# Patient Record
Sex: Female | Born: 1990 | Race: Black or African American | Hispanic: No | Marital: Single | Smoking: Never smoker
Health system: Southern US, Community
[De-identification: ages and names within clinical notes are randomized; demographics above are authoritative.]

## PROBLEM LIST (undated history)

## (undated) DIAGNOSIS — G35D Multiple sclerosis, unspecified: Secondary | ICD-10-CM

## (undated) DIAGNOSIS — G35 Multiple sclerosis: Secondary | ICD-10-CM

## (undated) DIAGNOSIS — Z789 Other specified health status: Secondary | ICD-10-CM

---
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
## 2008-01-14 ENCOUNTER — Emergency Department (HOSPITAL_COMMUNITY): Admission: EM | Admit: 2008-01-14 | Discharge: 2008-01-14 | Payer: Self-pay | Admitting: Emergency Medicine

## 2008-06-07 ENCOUNTER — Emergency Department (HOSPITAL_COMMUNITY): Admission: EM | Admit: 2008-06-07 | Discharge: 2008-06-07 | Payer: Self-pay | Admitting: Emergency Medicine

## 2008-08-22 ENCOUNTER — Emergency Department (HOSPITAL_COMMUNITY): Admission: EM | Admit: 2008-08-22 | Discharge: 2008-08-22 | Payer: Self-pay | Admitting: Emergency Medicine

## 2008-09-25 ENCOUNTER — Emergency Department (HOSPITAL_COMMUNITY): Admission: EM | Admit: 2008-09-25 | Discharge: 2008-09-25 | Payer: Self-pay | Admitting: Emergency Medicine

## 2009-02-18 ENCOUNTER — Emergency Department (HOSPITAL_COMMUNITY): Admission: EM | Admit: 2009-02-18 | Discharge: 2009-02-18 | Payer: Self-pay | Admitting: Emergency Medicine

## 2009-05-01 ENCOUNTER — Emergency Department (HOSPITAL_COMMUNITY)
Admission: EM | Admit: 2009-05-01 | Discharge: 2009-05-01 | Payer: Self-pay | Source: Home / Self Care | Admitting: Emergency Medicine

## 2009-06-13 ENCOUNTER — Emergency Department (HOSPITAL_COMMUNITY): Admission: EM | Admit: 2009-06-13 | Discharge: 2009-06-13 | Payer: Self-pay | Admitting: Emergency Medicine

## 2010-01-06 ENCOUNTER — Encounter (HOSPITAL_COMMUNITY): Admission: RE | Admit: 2010-01-06 | Discharge: 2010-01-09 | Payer: Self-pay | Admitting: Neurology

## 2010-01-12 ENCOUNTER — Encounter (HOSPITAL_COMMUNITY)
Admission: RE | Admit: 2010-01-12 | Discharge: 2010-02-11 | Payer: Self-pay | Source: Home / Self Care | Admitting: Neurology

## 2010-02-12 ENCOUNTER — Encounter (HOSPITAL_COMMUNITY)
Admission: RE | Admit: 2010-02-12 | Discharge: 2010-03-14 | Payer: Self-pay | Source: Home / Self Care | Admitting: Neurology

## 2010-06-28 LAB — URINALYSIS, ROUTINE W REFLEX MICROSCOPIC
Bilirubin Urine: NEGATIVE
Nitrite: NEGATIVE
Specific Gravity, Urine: <1.005 — ABNORMAL LOW (ref 1.005–1.030)
Urobilinogen, UA: 0.2 mg/dL (ref 0.0–1.0)
pH: 6.5 (ref 5.0–8.0)

## 2010-06-28 LAB — PREGNANCY, URINE: Preg Test, Ur: NEGATIVE

## 2010-07-05 LAB — URINALYSIS, ROUTINE W REFLEX MICROSCOPIC
Ketones, ur: NEGATIVE mg/dL
Nitrite: NEGATIVE
Protein, ur: NEGATIVE mg/dL
Urobilinogen, UA: 0.2 mg/dL (ref 0.0–1.0)

## 2010-07-05 LAB — BASIC METABOLIC PANEL
BUN: 8 mg/dL (ref 6–23)
Calcium: 8.9 mg/dL (ref 8.4–10.5)
Creatinine, Ser: 0.78 mg/dL (ref 0.4–1.2)
GFR calc Af Amer: >60 mL/min (ref >60)

## 2010-07-05 LAB — RAPID URINE DRUG SCREEN, HOSP PERFORMED
Amphetamines: NOT DETECTED
Barbiturates: NOT DETECTED
Cocaine: NOT DETECTED
Opiates: NOT DETECTED
Tetrahydrocannabinol: NOT DETECTED

## 2010-07-05 LAB — CBC
MCV: 85.5 fL (ref 78.0–100.0)
Platelets: 180 10*3/uL (ref 150–400)
RBC: 4.18 MIL/uL (ref 3.87–5.11)
WBC: 8.1 10*3/uL (ref 4.0–10.5)

## 2010-07-05 LAB — DIFFERENTIAL
Basophils Relative: 0 % (ref 0–1)
Eosinophils Absolute: 0.1 10*3/uL (ref 0.0–0.7)
Lymphs Abs: 0.8 10*3/uL (ref 0.7–4.0)
Monocytes Relative: 9 % (ref 3–12)
Neutro Abs: 6.4 10*3/uL (ref 1.7–7.7)
Neutrophils Relative %: 80 % — ABNORMAL HIGH (ref 43–77)

## 2010-07-05 LAB — PREGNANCY, URINE: Preg Test, Ur: NEGATIVE

## 2010-07-20 LAB — PREGNANCY, URINE: Preg Test, Ur: NEGATIVE

## 2010-07-20 LAB — WET PREP, GENITAL
Trich, Wet Prep: NONE SEEN
Yeast Wet Prep HPF POC: NONE SEEN

## 2010-07-20 LAB — URINALYSIS, ROUTINE W REFLEX MICROSCOPIC
Bilirubin Urine: NEGATIVE
Ketones, ur: NEGATIVE mg/dL
Nitrite: NEGATIVE
Urobilinogen, UA: 0.2 mg/dL (ref 0.0–1.0)

## 2010-11-11 ENCOUNTER — Encounter: Payer: Self-pay | Admitting: Podiatry

## 2010-11-11 DIAGNOSIS — R29818 Other symptoms and signs involving the nervous system: Secondary | ICD-10-CM | POA: Insufficient documentation

## 2010-12-31 LAB — URINALYSIS, ROUTINE W REFLEX MICROSCOPIC
Ketones, ur: NEGATIVE
Nitrite: NEGATIVE
pH: 6

## 2010-12-31 LAB — URINE CULTURE: Colony Count: 85000

## 2010-12-31 LAB — DIFFERENTIAL
Basophils Absolute: 0
Basophils Relative: 0
Eosinophils Absolute: 0.1
Eosinophils Relative: 1
Lymphocytes Relative: 33
Lymphs Abs: 2.8
Monocytes Absolute: 0.4
Monocytes Relative: 4
Neutro Abs: 5.3
Neutrophils Relative %: 62

## 2010-12-31 LAB — CBC
HCT: 39.4
Hemoglobin: 13.1
MCHC: 33.2
MCV: 86
Platelets: 240
RBC: 4.58
RDW: 14.3
WBC: 8.6

## 2010-12-31 LAB — BASIC METABOLIC PANEL
BUN: 7
Calcium: 9.3
Creatinine, Ser: 0.72
GFR calc non Af Amer: >60
Glucose, Bld: 89

## 2010-12-31 LAB — URINE MICROSCOPIC-ADD ON

## 2011-01-05 LAB — WET PREP, GENITAL
Trich, Wet Prep: NONE SEEN
Yeast Wet Prep HPF POC: NONE SEEN

## 2011-01-05 LAB — GC/CHLAMYDIA PROBE AMP, GENITAL: GC Probe Amp, Genital: NEGATIVE

## 2011-01-21 LAB — CULTURE, ROUTINE-ABSCESS

## 2011-01-21 LAB — OB RESULTS CONSOLE ABO/RH: RH Type: POSITIVE

## 2011-01-21 LAB — OB RESULTS CONSOLE HIV ANTIBODY (ROUTINE TESTING): HIV: NONREACTIVE

## 2011-01-21 LAB — OB RESULTS CONSOLE GC/CHLAMYDIA: Chlamydia: NEGATIVE

## 2011-01-21 LAB — OB RESULTS CONSOLE RUBELLA ANTIBODY, IGM: Rubella: IMMUNE

## 2011-01-27 ENCOUNTER — Other Ambulatory Visit: Payer: Self-pay | Admitting: Adult Health

## 2011-01-27 ENCOUNTER — Other Ambulatory Visit (HOSPITAL_COMMUNITY)
Admission: RE | Admit: 2011-01-27 | Discharge: 2011-01-27 | Disposition: A | Discharge status: Home / Self Care | Payer: Medicare Other | Source: Ambulatory Visit | Attending: Obstetrics and Gynecology | Admitting: Obstetrics and Gynecology

## 2011-01-27 DIAGNOSIS — R87810 Cervical high risk human papillomavirus (HPV) DNA test positive: Secondary | ICD-10-CM | POA: Insufficient documentation

## 2011-01-27 DIAGNOSIS — Z124 Encounter for screening for malignant neoplasm of cervix: Secondary | ICD-10-CM | POA: Insufficient documentation

## 2011-01-27 DIAGNOSIS — Z113 Encounter for screening for infections with a predominantly sexual mode of transmission: Secondary | ICD-10-CM | POA: Insufficient documentation

## 2011-01-27 LAB — CULTURE, ROUTINE-ABSCESS

## 2011-07-22 LAB — OB RESULTS CONSOLE GBS: GBS: POSITIVE

## 2011-08-08 ENCOUNTER — Inpatient Hospital Stay (HOSPITAL_COMMUNITY)
Admission: AD | Admit: 2011-08-08 | Discharge: 2011-08-11 | DRG: 766 | Disposition: A | Discharge status: Home / Self Care | Payer: Medicare Other | Source: Ambulatory Visit | Attending: Obstetrics & Gynecology | Admitting: Obstetrics & Gynecology

## 2011-08-08 ENCOUNTER — Encounter (HOSPITAL_COMMUNITY): Payer: Self-pay | Admitting: *Deleted

## 2011-08-08 DIAGNOSIS — Z22330 Carrier of Group B streptococcus: Secondary | ICD-10-CM

## 2011-08-08 DIAGNOSIS — O4290 Premature rupture of membranes, unspecified as to length of time between rupture and onset of labor, unspecified weeks of gestation: Secondary | ICD-10-CM

## 2011-08-08 DIAGNOSIS — O99892 Other specified diseases and conditions complicating childbirth: Secondary | ICD-10-CM | POA: Diagnosis present

## 2011-08-08 HISTORY — DX: Multiple sclerosis, unspecified: G35.D

## 2011-08-08 HISTORY — DX: Other specified health status: Z78.9

## 2011-08-08 HISTORY — DX: Multiple sclerosis: G35

## 2011-08-09 ENCOUNTER — Inpatient Hospital Stay (HOSPITAL_COMMUNITY): Payer: Medicare Other | Admitting: Anesthesiology

## 2011-08-09 ENCOUNTER — Encounter (HOSPITAL_COMMUNITY): Payer: Self-pay | Admitting: *Deleted

## 2011-08-09 ENCOUNTER — Encounter (HOSPITAL_COMMUNITY)
Admission: AD | Disposition: A | Discharge status: Home / Self Care | Payer: Self-pay | Source: Ambulatory Visit | Attending: Obstetrics & Gynecology

## 2011-08-09 ENCOUNTER — Encounter (HOSPITAL_COMMUNITY): Payer: Self-pay | Admitting: Anesthesiology

## 2011-08-09 DIAGNOSIS — G35 Multiple sclerosis: Secondary | ICD-10-CM | POA: Insufficient documentation

## 2011-08-09 DIAGNOSIS — G35D Multiple sclerosis, unspecified: Secondary | ICD-10-CM | POA: Insufficient documentation

## 2011-08-09 DIAGNOSIS — O4290 Premature rupture of membranes, unspecified as to length of time between rupture and onset of labor, unspecified weeks of gestation: Secondary | ICD-10-CM

## 2011-08-09 DIAGNOSIS — O9989 Other specified diseases and conditions complicating pregnancy, childbirth and the puerperium: Secondary | ICD-10-CM

## 2011-08-09 LAB — GLUCOSE, CAPILLARY
Glucose-Capillary: 74 mg/dL (ref 70–99)
Glucose-Capillary: 87 mg/dL (ref 70–99)

## 2011-08-09 LAB — CBC
HCT: 40 % (ref 36.0–46.0)
Hemoglobin: 13.5 g/dL (ref 12.0–15.0)
RBC: 4.77 MIL/uL (ref 3.87–5.11)
RDW: 14.6 % (ref 11.5–15.5)
WBC: 19.2 10*3/uL — ABNORMAL HIGH (ref 4.0–10.5)

## 2011-08-09 LAB — MRSA PCR SCREENING: MRSA by PCR: NEGATIVE

## 2011-08-09 SURGERY — Surgical Case
Anesthesia: Epidural | Site: Abdomen | Wound class: Clean Contaminated

## 2011-08-09 MED ORDER — ONDANSETRON HCL 4 MG/2ML IJ SOLN: 4.0000 mg | Freq: Four times a day (QID) | INTRAMUSCULAR | Status: DC | PRN

## 2011-08-09 MED ORDER — ONDANSETRON HCL 4 MG/2ML IJ SOLN
INTRAMUSCULAR | Status: AC
  Filled 2011-08-09: qty 2

## 2011-08-09 MED ORDER — LACTATED RINGERS IV SOLN
INTRAVENOUS | Status: DC
  Administered 2011-08-09: 07:00:00 via INTRAVENOUS
  Administered 2011-08-09 (×2): 125 mL/h via INTRAVENOUS

## 2011-08-09 MED ORDER — DIPHENHYDRAMINE HCL 25 MG PO CAPS
25.0000 mg | ORAL_CAPSULE | ORAL | Status: DC | PRN
  Administered 2011-08-10: 25 mg via ORAL
  Filled 2011-08-09: qty 1

## 2011-08-09 MED ORDER — PHENYLEPHRINE 40 MCG/ML (10ML) SYRINGE FOR IV PUSH (FOR BLOOD PRESSURE SUPPORT)
PREFILLED_SYRINGE | INTRAVENOUS | Status: AC
  Filled 2011-08-09: qty 5

## 2011-08-09 MED ORDER — FENTANYL 2.5 MCG/ML BUPIVACAINE 1/10 % EPIDURAL INFUSION (WH - ANES)
14.0000 mL/h | INTRAMUSCULAR | Status: DC
  Administered 2011-08-09: 14 mL/h via EPIDURAL
  Administered 2011-08-09: 12 mL/h via EPIDURAL
  Administered 2011-08-09: 14 mL/h via EPIDURAL
  Filled 2011-08-09 (×3): qty 60

## 2011-08-09 MED ORDER — WITCH HAZEL-GLYCERIN EX PADS: 1.0000 "application " | MEDICATED_PAD | CUTANEOUS | Status: DC | PRN

## 2011-08-09 MED ORDER — TETANUS-DIPHTH-ACELL PERTUSSIS 5-2.5-18.5 LF-MCG/0.5 IM SUSP
0.5000 mL | Freq: Once | INTRAMUSCULAR | Status: AC
  Administered 2011-08-10: 0.5 mL via INTRAMUSCULAR

## 2011-08-09 MED ORDER — PENICILLIN G POTASSIUM 5000000 UNITS IJ SOLR
2.5000 10*6.[IU] | INTRAVENOUS | Status: DC
  Administered 2011-08-09 (×3): 2.5 10*6.[IU] via INTRAVENOUS
  Filled 2011-08-09 (×7): qty 2.5

## 2011-08-09 MED ORDER — SENNOSIDES-DOCUSATE SODIUM 8.6-50 MG PO TABS
2.0000 | ORAL_TABLET | Freq: Every day | ORAL | Status: DC
  Administered 2011-08-10: 2 via ORAL

## 2011-08-09 MED ORDER — OXYCODONE-ACETAMINOPHEN 5-325 MG PO TABS
1.0000 | ORAL_TABLET | ORAL | Status: DC | PRN
  Administered 2011-08-10: 1 via ORAL
  Filled 2011-08-09: qty 1

## 2011-08-09 MED ORDER — SCOPOLAMINE 1 MG/3DAYS TD PT72
MEDICATED_PATCH | TRANSDERMAL | Status: AC
  Filled 2011-08-09: qty 1

## 2011-08-09 MED ORDER — PHENYLEPHRINE HCL 10 MG/ML IJ SOLN
INTRAMUSCULAR | Status: DC | PRN
  Administered 2011-08-09: 80 ug via INTRAVENOUS
  Administered 2011-08-09: 120 ug via INTRAVENOUS
  Administered 2011-08-09 (×2): 80 ug via INTRAVENOUS
  Administered 2011-08-09: 20 ug via INTRAVENOUS

## 2011-08-09 MED ORDER — TERBUTALINE SULFATE 1 MG/ML IJ SOLN
0.2500 mg | Freq: Once | INTRAMUSCULAR | Status: AC | PRN
  Administered 2011-08-09: 0.25 mg via SUBCUTANEOUS

## 2011-08-09 MED ORDER — LACTATED RINGERS IV SOLN
INTRAVENOUS | Status: DC | PRN
  Administered 2011-08-09: 17:00:00 via INTRAVENOUS

## 2011-08-09 MED ORDER — 0.9 % SODIUM CHLORIDE (POUR BTL) OPTIME
TOPICAL | Status: DC | PRN
  Administered 2011-08-09: 1000 mL

## 2011-08-09 MED ORDER — DIBUCAINE 1 % RE OINT: 1.0000 "application " | TOPICAL_OINTMENT | RECTAL | Status: DC | PRN

## 2011-08-09 MED ORDER — CITRIC ACID-SODIUM CITRATE 334-500 MG/5ML PO SOLN
30.0000 mL | ORAL | Status: DC | PRN
  Filled 2011-08-09 (×2): qty 15

## 2011-08-09 MED ORDER — MORPHINE SULFATE 0.5 MG/ML IJ SOLN
INTRAMUSCULAR | Status: AC
  Filled 2011-08-09: qty 10

## 2011-08-09 MED ORDER — TERBUTALINE SULFATE 1 MG/ML IJ SOLN
INTRAMUSCULAR | Status: AC
  Filled 2011-08-09: qty 1

## 2011-08-09 MED ORDER — ZOLPIDEM TARTRATE 10 MG PO TABS: 10.0000 mg | ORAL_TABLET | Freq: Every evening | ORAL | Status: DC | PRN

## 2011-08-09 MED ORDER — OXYTOCIN 10 UNIT/ML IJ SOLN
INTRAMUSCULAR | Status: AC
  Filled 2011-08-09: qty 2

## 2011-08-09 MED ORDER — ONDANSETRON HCL 4 MG/2ML IJ SOLN: 4.0000 mg | INTRAMUSCULAR | Status: DC | PRN

## 2011-08-09 MED ORDER — OXYTOCIN 10 UNIT/ML IJ SOLN
INTRAMUSCULAR | Status: DC | PRN
  Administered 2011-08-09: 20 [IU] via INTRAMUSCULAR

## 2011-08-09 MED ORDER — CLINDAMYCIN PHOSPHATE 900 MG/50ML IV SOLN: 900.0000 mg | Freq: Three times a day (TID) | INTRAVENOUS | Status: DC

## 2011-08-09 MED ORDER — NALBUPHINE HCL 10 MG/ML IJ SOLN: 5.0000 mg | INTRAMUSCULAR | Status: DC | PRN

## 2011-08-09 MED ORDER — SODIUM BICARBONATE 8.4 % IV SOLN
INTRAVENOUS | Status: DC | PRN
  Administered 2011-08-09: 5 mL via EPIDURAL

## 2011-08-09 MED ORDER — ONDANSETRON HCL 4 MG/2ML IJ SOLN
INTRAMUSCULAR | Status: DC | PRN
  Administered 2011-08-09: 4 mg via INTRAVENOUS

## 2011-08-09 MED ORDER — ZOLPIDEM TARTRATE 5 MG PO TABS: 5.0000 mg | ORAL_TABLET | Freq: Every evening | ORAL | Status: DC | PRN

## 2011-08-09 MED ORDER — FENTANYL CITRATE 0.05 MG/ML IJ SOLN: 25.0000 ug | INTRAMUSCULAR | Status: DC | PRN

## 2011-08-09 MED ORDER — HYDROXYZINE HCL 50 MG PO TABS: 50.0000 mg | ORAL_TABLET | Freq: Four times a day (QID) | ORAL | Status: DC | PRN

## 2011-08-09 MED ORDER — DIPHENHYDRAMINE HCL 25 MG PO CAPS: 25.0000 mg | ORAL_CAPSULE | Freq: Four times a day (QID) | ORAL | Status: DC | PRN

## 2011-08-09 MED ORDER — IBUPROFEN 600 MG PO TABS: 600.0000 mg | ORAL_TABLET | Freq: Four times a day (QID) | ORAL | Status: DC | PRN

## 2011-08-09 MED ORDER — SODIUM CHLORIDE 0.9 % IV SOLN: 1.0000 ug/kg/h | INTRAVENOUS | Status: DC | PRN

## 2011-08-09 MED ORDER — METOCLOPRAMIDE HCL 5 MG/ML IJ SOLN
10.0000 mg | Freq: Three times a day (TID) | INTRAMUSCULAR | Status: DC | PRN
  Administered 2011-08-10: 10 mg via INTRAVENOUS
  Filled 2011-08-09: qty 2

## 2011-08-09 MED ORDER — ACETAMINOPHEN 325 MG PO TABS: 650.0000 mg | ORAL_TABLET | ORAL | Status: DC | PRN

## 2011-08-09 MED ORDER — KETOROLAC TROMETHAMINE 30 MG/ML IJ SOLN
INTRAMUSCULAR | Status: AC
  Administered 2011-08-09: 30 mg via INTRAMUSCULAR
  Filled 2011-08-09: qty 1

## 2011-08-09 MED ORDER — LANOLIN HYDROUS EX OINT: 1.0000 "application " | TOPICAL_OINTMENT | CUTANEOUS | Status: DC | PRN

## 2011-08-09 MED ORDER — IBUPROFEN 600 MG PO TABS
600.0000 mg | ORAL_TABLET | Freq: Four times a day (QID) | ORAL | Status: DC
  Administered 2011-08-10 – 2011-08-11 (×6): 600 mg via ORAL
  Filled 2011-08-09 (×6): qty 1

## 2011-08-09 MED ORDER — LACTATED RINGERS IV SOLN
INTRAVENOUS | Status: DC
  Administered 2011-08-09 – 2011-08-10 (×2): via INTRAVENOUS

## 2011-08-09 MED ORDER — HYDROXYZINE HCL 50 MG/ML IM SOLN
50.0000 mg | Freq: Four times a day (QID) | INTRAMUSCULAR | Status: DC | PRN
  Filled 2011-08-09: qty 1

## 2011-08-09 MED ORDER — OXYTOCIN BOLUS FROM INFUSION
500.0000 mL | Freq: Once | INTRAVENOUS | Status: DC
  Filled 2011-08-09: qty 500

## 2011-08-09 MED ORDER — MORPHINE SULFATE (PF) 0.5 MG/ML IJ SOLN
INTRAMUSCULAR | Status: DC | PRN
  Administered 2011-08-09: 4 mg via EPIDURAL

## 2011-08-09 MED ORDER — LACTATED RINGERS IV SOLN
INTRAVENOUS | Status: DC
  Administered 2011-08-09: via INTRAVENOUS

## 2011-08-09 MED ORDER — LIDOCAINE HCL (PF) 1 % IJ SOLN: 30.0000 mL | INTRAMUSCULAR | Status: DC | PRN

## 2011-08-09 MED ORDER — OXYTOCIN 20 UNITS IN LACTATED RINGERS INFUSION - SIMPLE: 125.0000 mL/h | Freq: Once | INTRAVENOUS | Status: DC

## 2011-08-09 MED ORDER — LACTATED RINGERS IV SOLN
500.0000 mL | Freq: Once | INTRAVENOUS | Status: AC
  Administered 2011-08-09: 500 mL via INTRAVENOUS

## 2011-08-09 MED ORDER — MEPERIDINE HCL 25 MG/ML IJ SOLN: 6.2500 mg | INTRAMUSCULAR | Status: DC | PRN

## 2011-08-09 MED ORDER — NALOXONE HCL 0.4 MG/ML IJ SOLN: 0.4000 mg | INTRAMUSCULAR | Status: DC | PRN

## 2011-08-09 MED ORDER — KETOROLAC TROMETHAMINE 30 MG/ML IJ SOLN
30.0000 mg | Freq: Four times a day (QID) | INTRAMUSCULAR | Status: AC | PRN
  Administered 2011-08-09: 30 mg via INTRAMUSCULAR

## 2011-08-09 MED ORDER — PRENATAL MULTIVITAMIN CH
1.0000 | ORAL_TABLET | Freq: Every day | ORAL | Status: DC
  Administered 2011-08-10 – 2011-08-11 (×2): 1 via ORAL
  Filled 2011-08-09 (×3): qty 1

## 2011-08-09 MED ORDER — PHENYLEPHRINE 40 MCG/ML (10ML) SYRINGE FOR IV PUSH (FOR BLOOD PRESSURE SUPPORT)
80.0000 ug | PREFILLED_SYRINGE | INTRAVENOUS | Status: DC | PRN
  Administered 2011-08-09: 80 ug via INTRAVENOUS
  Filled 2011-08-09: qty 5

## 2011-08-09 MED ORDER — SIMETHICONE 80 MG PO CHEW
80.0000 mg | CHEWABLE_TABLET | Freq: Three times a day (TID) | ORAL | Status: DC
  Administered 2011-08-10 – 2011-08-11 (×5): 80 mg via ORAL

## 2011-08-09 MED ORDER — CEFAZOLIN SODIUM-DEXTROSE 2-3 GM-% IV SOLR
2.0000 g | Freq: Once | INTRAVENOUS | Status: AC
  Administered 2011-08-09: 2 g via INTRAVENOUS
  Filled 2011-08-09: qty 50

## 2011-08-09 MED ORDER — LACTATED RINGERS IV SOLN
500.0000 mL | INTRAVENOUS | Status: DC | PRN
  Administered 2011-08-09: 500 mL via INTRAVENOUS

## 2011-08-09 MED ORDER — GENTAMICIN SULFATE 40 MG/ML IJ SOLN
Freq: Three times a day (TID) | INTRAVENOUS | Status: AC
  Administered 2011-08-09 – 2011-08-10 (×3): via INTRAVENOUS
  Filled 2011-08-09 (×4): qty 4.25

## 2011-08-09 MED ORDER — OXYCODONE-ACETAMINOPHEN 5-325 MG PO TABS: 1.0000 | ORAL_TABLET | ORAL | Status: DC | PRN

## 2011-08-09 MED ORDER — SCOPOLAMINE 1 MG/3DAYS TD PT72
1.0000 | MEDICATED_PATCH | Freq: Once | TRANSDERMAL | Status: DC
  Administered 2011-08-09: 1.5 mg via TRANSDERMAL

## 2011-08-09 MED ORDER — SIMETHICONE 80 MG PO CHEW: 80.0000 mg | CHEWABLE_TABLET | ORAL | Status: DC | PRN

## 2011-08-09 MED ORDER — FENTANYL CITRATE 0.05 MG/ML IJ SOLN
50.0000 ug | INTRAMUSCULAR | Status: DC | PRN
  Administered 2011-08-09: 50 ug via INTRAVENOUS
  Filled 2011-08-09: qty 2

## 2011-08-09 MED ORDER — ONDANSETRON HCL 4 MG/2ML IJ SOLN: 4.0000 mg | Freq: Three times a day (TID) | INTRAMUSCULAR | Status: DC | PRN

## 2011-08-09 MED ORDER — DIPHENHYDRAMINE HCL 50 MG/ML IJ SOLN: 25.0000 mg | INTRAMUSCULAR | Status: DC | PRN

## 2011-08-09 MED ORDER — DIPHENHYDRAMINE HCL 50 MG/ML IJ SOLN
12.5000 mg | INTRAMUSCULAR | Status: DC | PRN
  Administered 2011-08-10: 12.5 mg via INTRAVENOUS
  Filled 2011-08-09: qty 1

## 2011-08-09 MED ORDER — FLEET ENEMA 7-19 GM/118ML RE ENEM: 1.0000 | ENEMA | RECTAL | Status: DC | PRN

## 2011-08-09 MED ORDER — SODIUM CHLORIDE 0.9 % IJ SOLN: 3.0000 mL | INTRAMUSCULAR | Status: DC | PRN

## 2011-08-09 MED ORDER — LIDOCAINE HCL (PF) 1 % IJ SOLN
INTRAMUSCULAR | Status: DC | PRN
  Administered 2011-08-09 (×2): 5 mL

## 2011-08-09 MED ORDER — EPHEDRINE 5 MG/ML INJ
10.0000 mg | INTRAVENOUS | Status: DC | PRN
  Filled 2011-08-09: qty 4

## 2011-08-09 MED ORDER — ONDANSETRON HCL 4 MG PO TABS: 4.0000 mg | ORAL_TABLET | ORAL | Status: DC | PRN

## 2011-08-09 MED ORDER — MENTHOL 3 MG MT LOZG: 1.0000 | LOZENGE | OROMUCOSAL | Status: DC | PRN

## 2011-08-09 MED ORDER — EPHEDRINE 5 MG/ML INJ: 10.0000 mg | INTRAVENOUS | Status: DC | PRN

## 2011-08-09 MED ORDER — OXYTOCIN 20 UNITS IN LACTATED RINGERS INFUSION - SIMPLE: 125.0000 mL/h | INTRAVENOUS | Status: AC

## 2011-08-09 MED ORDER — DIPHENHYDRAMINE HCL 50 MG/ML IJ SOLN: 12.5000 mg | INTRAMUSCULAR | Status: DC | PRN

## 2011-08-09 MED ORDER — KETOROLAC TROMETHAMINE 30 MG/ML IJ SOLN
30.0000 mg | Freq: Four times a day (QID) | INTRAMUSCULAR | Status: AC | PRN
  Filled 2011-08-09: qty 1

## 2011-08-09 MED ORDER — PENICILLIN G POTASSIUM 5000000 UNITS IJ SOLR
5.0000 10*6.[IU] | Freq: Once | INTRAVENOUS | Status: AC
  Administered 2011-08-09: 5 10*6.[IU] via INTRAVENOUS
  Filled 2011-08-09: qty 5

## 2011-08-09 MED ORDER — OXYTOCIN 20 UNITS IN LACTATED RINGERS INFUSION - SIMPLE
1.0000 m[IU]/min | INTRAVENOUS | Status: DC
  Administered 2011-08-09: 2 m[IU]/min via INTRAVENOUS
  Filled 2011-08-09: qty 1000

## 2011-08-09 SURGICAL SUPPLY — 33 items
CHLORAPREP W/TINT 26ML (MISCELLANEOUS) ×2 IMPLANT
CLOTH BEACON ORANGE TIMEOUT ST (SAFETY) ×2 IMPLANT
CONTAINER PREFILL 10% NBF 15ML (MISCELLANEOUS) IMPLANT
DRAIN JACKSON PRT FLT 7MM (DRAIN) IMPLANT
DRESSING TELFA 8X3 (GAUZE/BANDAGES/DRESSINGS) ×2 IMPLANT
ELECT REM PT RETURN 9FT ADLT (ELECTROSURGICAL) ×2
ELECTRODE REM PT RTRN 9FT ADLT (ELECTROSURGICAL) ×1 IMPLANT
EVACUATOR SILICONE 100CC (DRAIN) IMPLANT
EXTRACTOR VACUUM M CUP 4 TUBE (SUCTIONS) IMPLANT
GAUZE SPONGE 4X4 12PLY STRL LF (GAUZE/BANDAGES/DRESSINGS) ×4 IMPLANT
GLOVE BIO SURGEON STRL SZ7 (GLOVE) ×2 IMPLANT
GLOVE BIOGEL PI IND STRL 7.0 (GLOVE) ×2 IMPLANT
GLOVE BIOGEL PI IND STRL 7.5 (GLOVE) ×3 IMPLANT
GLOVE BIOGEL PI INDICATOR 7.0 (GLOVE) ×2
GLOVE BIOGEL PI INDICATOR 7.5 (GLOVE) ×3
GLOVE ECLIPSE 7.5 STRL STRAW (GLOVE) ×4 IMPLANT
GOWN PREVENTION PLUS LG XLONG (DISPOSABLE) ×6 IMPLANT
KIT ABG SYR 3ML LUER SLIP (SYRINGE) IMPLANT
NEEDLE HYPO 25X5/8 SAFETYGLIDE (NEEDLE) ×2 IMPLANT
NS IRRIG 1000ML POUR BTL (IV SOLUTION) ×2 IMPLANT
PACK C SECTION WH (CUSTOM PROCEDURE TRAY) ×2 IMPLANT
PAD ABD 7.5X8 STRL (GAUZE/BANDAGES/DRESSINGS) ×2 IMPLANT
RTRCTR C-SECT PINK 25CM LRG (MISCELLANEOUS) ×2 IMPLANT
SLEEVE SCD COMPRESS KNEE MED (MISCELLANEOUS) ×2 IMPLANT
SPONGE GAUZE 4X4 12PLY (GAUZE/BANDAGES/DRESSINGS) ×2 IMPLANT
STAPLER VISISTAT 35W (STAPLE) IMPLANT
SUT VIC AB 0 CTX 36 (SUTURE) ×10
SUT VIC AB 0 CTX36XBRD ANBCTRL (SUTURE) ×5 IMPLANT
SUT VIC AB 4-0 KS 27 (SUTURE) IMPLANT
TAPE CLOTH SURG 4X10 WHT LF (GAUZE/BANDAGES/DRESSINGS) ×2 IMPLANT
TOWEL OR 17X24 6PK STRL BLUE (TOWEL DISPOSABLE) ×4 IMPLANT
TRAY FOLEY CATH 14FR (SET/KITS/TRAYS/PACK) IMPLANT
WATER STERILE IRR 1000ML POUR (IV SOLUTION) ×2 IMPLANT

## 2011-08-09 NOTE — Anesthesia Postprocedure Evaluation (Signed)
  Anesthesia Post-op Note  Patient: Melissa West  Procedure(s) Performed: Procedure(s) (LRB): CESAREAN SECTION (N/A)  Patient Location: PACU  Anesthesia Type: Epidural  Level of Consciousness: awake, alert  and oriented  Airway and Oxygen Therapy: Patient Spontanous Breathing  Post-op Pain: none  Post-op Assessment: Post-op Vital signs reviewed, Patient's Cardiovascular Status Stable, Respiratory Function Stable, Patent Airway, No signs of Nausea or vomiting, Pain level controlled, No headache and No backache  Post-op Vital Signs: Reviewed and stable  Complications: No apparent anesthesia complications

## 2011-08-09 NOTE — Progress Notes (Signed)
  Subjective: Patient comfortable.  No complaints.  Objective: BP 111/70  Pulse 150  Temp(Src) 98 F (36.7 C) (Oral)  Resp 20  Ht 5\' 3"  (1.6 m)  Wt 93.498 kg (206 lb 2 oz)  BMI 36.51 kg/m2  SpO2 100%   Total I/O In: -  Out: 700 [Urine:700]  FHT:  FHR: 140s-150s bpm, variability: moderate,  accelerations:  Abscent,  decelerations:  Present variable UC:   regular, every 5 minutes SVE:   Dilation: 4 Effacement (%): 70 Station: -1 Exam by:: Dr Adrian Blackwater  Labs: Lab Results  Component Value Date   WBC 19.2* 08/09/2011   HGB 13.5 08/09/2011   HCT 40.0 08/09/2011   MCV 83.9 08/09/2011   PLT 203 08/09/2011    Assessment / Plan: Category 2 tracing.  Contraction pattern improving.  Will continue to titrate pitocin and monitor closely.  Analyse Angst JEHIEL 08/09/2011, 3:33 PM

## 2011-08-09 NOTE — Transfer of Care (Signed)
Immediate Anesthesia Transfer of Care Note  Patient: Melissa West  Procedure(s) Performed: Procedure(s) (LRB): CESAREAN SECTION (N/A)  Patient Location: PACU  Anesthesia Type: Epidural  Level of Consciousness: awake and oriented  Airway & Oxygen Therapy: Patient Spontanous Breathing  Post-op Assessment: Post -op Vital signs reviewed and stable  Post vital signs: Reviewed and stable  Complications: No apparent anesthesia complications

## 2011-08-09 NOTE — Anesthesia Preprocedure Evaluation (Addendum)
Anesthesia Evaluation  Patient identified by MRN, date of birth, ID band Patient awake    Reviewed: Allergy & Precautions, H&P , Patient's Chart, lab work & pertinent test results  Airway Mallampati: III TM Distance: >3 FB Neck ROM: full    Dental No notable dental hx.    Pulmonary neg pulmonary ROS,  breath sounds clear to auscultation  Pulmonary exam normal       Cardiovascular negative cardio ROS  Rhythm:regular Rate:Normal     Neuro/Psych MS- R leg weakness negative neurological ROS  negative psych ROS   GI/Hepatic negative GI ROS, Neg liver ROS,   Endo/Other  negative endocrine ROS  Renal/GU negative Renal ROS     Musculoskeletal   Abdominal   Peds  Hematology negative hematology ROS (+)   Anesthesia Other Findings   Reproductive/Obstetrics (+) Pregnancy                           Anesthesia Physical Anesthesia Plan  ASA: III and Emergent  Anesthesia Plan: Epidural   Post-op Pain Management:    Induction:   Airway Management Planned: Natural Airway  Additional Equipment:   Intra-op Plan:   Post-operative Plan:   Informed Consent: I have reviewed the patients History and Physical, chart, labs and discussed the procedure including the risks, benefits and alternatives for the proposed anesthesia with the patient or authorized representative who has indicated his/her understanding and acceptance.     Plan Discussed with: CRNA, Anesthesiologist and Surgeon  Anesthesia Plan Comments:        Anesthesia Quick Evaluation

## 2011-08-09 NOTE — H&P (Signed)
Melissa West is a 21 y.o. female presenting for rupture of membranes. Maternal Medical History:  Reason for admission: Reason for admission: rupture of membranes.  Contractions: Frequency: irregular.   Perceived severity is mild.    Fetal activity: Perceived fetal activity is normal.   Last perceived fetal movement was within the past hour.    Prenatal complications: no prenatal complications Prenatal Complications - Diabetes: none.    OB History    Grav Para Term Preterm Abortions TAB SAB Ect Mult Living   1              Past Medical History  Diagnosis Date  . MS (multiple sclerosis)   . No pertinent past medical history    History reviewed. No pertinent past surgical history. Family History: family history is not on file. Social History:  reports that she has never smoked. She has never used smokeless tobacco. She reports that she does not drink alcohol or use illicit drugs.  ROS Pertinent items are noted in HPI.  Dilation: 1.5 Effacement (%): 70 Station: -2 Exam by:: M.Topp,RN-note BBOW Blood pressure 114/64, pulse 109, temperature 98.7 F (37.1 C), temperature source Oral, resp. rate 20, height 5\' 3"  (1.6 m), weight 93.498 kg (206 lb 2 oz). Exam Physical Exam  Gen:AAF, NAD, comfortable, not in pain.  Lungs:  Normal respiratory effort, chest expands symmetrically. Lungs are clear to auscultation, no crackles or wheezes. Heart - Regular rate and rhythm.  No murmurs, gallops or rubs.    Abdomen: soft and non-tender without masses, organomegaly or hernias noted.  No guarding or rebound. Gravid Extremities:   Non-tender, No cyanosis, edema, or deformity noted. Skin:  Intact without suspicious lesions or rashes  Prenatal labs: ABO, Rh:   O + Antibody:   Neg Rubella:   imm RPR:   Neg HBsAg:   Neg HIV:   neg GBS:   positive 38 wks - baby weight 6lbs6 oz No GTT done Assessment/Plan: 21 y/o aaf G1P0 @40 .1 with SROM  1. SROM - not in active labor - GBS  positive - starting penicillin - admit to L+D for expectant management. - will check again in 4 hours.    Edd Arbour MD 08/09/2011, 12:36 AM

## 2011-08-09 NOTE — Progress Notes (Signed)
Melissa West is a 21 y.o. G1P0000 at [redacted]w[redacted]d in labor Subjective:   Objective: BP 103/71  Pulse 86  Temp(Src) 97.7 F (36.5 C) (Oral)  Resp 20  Ht 5\' 3"  (1.6 m)  Wt 93.498 kg (206 lb 2 oz)  BMI 36.51 kg/m2  SpO2 100%      FHT:  FHR: 120 bpm, variability: moderate,  accelerations:  Present,  decelerations:  Absent UC:   regular, every 2-4 minutes SVE: 4-5/100/-1 LOP.  Since last exam, the anterior lip has swollen to 2 cm.  The rest of the cervix remains completely effaced.   Pt still unable to move any part of her lower extremities due to epidural -- anesthesia notified. Labs: Lab Results  Component Value Date   WBC 19.2* 08/09/2011   HGB 13.5 08/09/2011   HCT 40.0 08/09/2011   MCV 83.9 08/09/2011   PLT 203 08/09/2011    Assessment / Plan: Augmenting labor.  Pt adequate.   Pt needs to be rotated every hour since she can't move on her on.  RN Austwell Nation) aware of importance of position changes as fetal position is OP. Anesthesia coming to turn down rate--Dr. Malen Gauze Concerning that lip of cervix is now swollen and it was not swollen at last exam. Will recheck in 2 hours of sooner if necessary   Melissa West H. 08/09/2011, 1:02 PM

## 2011-08-09 NOTE — Progress Notes (Signed)
Pulse verified by apical pulse of 122 bpm.

## 2011-08-09 NOTE — Progress Notes (Signed)
Patient ID: Melissa West, female   DOB: 02/17/1991, 21 y.o.   MRN: [id number]  Patient continues to have late decelerations with each contraction.  Discussed with patient need for cesarean section for fetal intolerance of labor.  The risks of cesarean section discussed with the patient included but were not limited to: bleeding which may require transfusion or reoperation; infection which may require antibiotics; injury to bowel, bladder, ureters or other surrounding organs; injury to the fetus; need for additional procedures including hysterectomy in the event of a life-threatening hemorrhage; placental abnormalities wth subsequent pregnancies, incisional problems, thromboembolic phenomenon and other postoperative/anesthesia complications. The patient concurred with the proposed plan, giving informed written consent for the procedure.   Anesthesia and OR aware.  Preoperative prophylactic Ancef ordered on call to the OR.  To OR when ready.  Melissa West 08/09/2011 4:32 PM

## 2011-08-09 NOTE — Progress Notes (Signed)
ANTIBIOTIC CONSULT NOTE - INITIAL  Pharmacy Consult for Gentamicin Indication: Postpartum endometritis  No Known Allergies  Patient Measurements: Height: 5\' 3"  (160 cm) Weight: 206 lb 2 oz (93.498 kg) IBW/kg (Calculated) : 52.4  Adjusted Body Weight: 64.7kg  Vital Signs: Temp: 98.2 F (36.8 C) (04/29 2134) Temp src: Axillary (04/29 1649) BP: 104/66 mmHg (04/29 2134) Pulse Rate: 80  (04/29 2134)    Labs:  Basename 08/09/11 0035  WBC 19.2*  HGB 13.5  PLT 203  LABCREA --  CREATININE --    Medical History: Past Medical History  Diagnosis Date  . MS (multiple sclerosis)   . No pertinent past medical history     Medications: Clindamycin 900mg  IV q8h  Assessment: 21 yo F s/p C-section started on Gentamicin and Clindamycin for postpartum endometritis.  Goal of Therapy:  Gentamcin peaks 6-56mcg/ml  Troughs < 1 mcg/ml.   Plan:  1. Gentamicin 170mg  IV q8h. 2. Draw SCreatinine in am with labs. 3. Will continue to follow and check levels as clinically indicated.  Thanks!  Claybon Jabs 08/09/2011,9:59 PM

## 2011-08-09 NOTE — Op Note (Signed)
[name] PROCEDURE DATE: 08/08/2011 - 08/09/2011  PREOPERATIVE DIAGNOSIS: Intrauterine pregnancy at  [redacted]w[redacted]d weeks gestation; non-reassuring fetal status  POSTOPERATIVE DIAGNOSIS: The same  PROCEDURE: Primary Low Transverse Cesarean Section  SURGEON:  Dr. Elsie Lincoln  ASSISTANT: Dr Candelaria Celeste  INDICATIONS: Melissa West is a 21 y.o. G1P0000 at [redacted]w[redacted]d taken for cesarean section secondary to non-reassuring fetal status.  The risks of cesarean section discussed with the patient included but were not limited to: bleeding which may require transfusion or reoperation; infection which may require antibiotics; injury to bowel, bladder, ureters or other surrounding organs; injury to the fetus; need for additional procedures including hysterectomy in the event of a life-threatening hemorrhage; placental abnormalities wth subsequent pregnancies, incisional problems, thromboembolic phenomenon and other postoperative/anesthesia complications. The patient concurred with the proposed plan, giving informed written consent for the procedure.    FINDINGS:  Viable female infant in cephalic presentation.  Apgars 9 and 9, weight pending.  Clear amniotic fluid.  Intact placenta, three vessel cord.  Normal uterus, fallopian tubes and ovaries bilaterally.  ANESTHESIA:    Spinal INTRAVENOUS FLUIDS:800 ml ESTIMATED BLOOD LOSS:600 ml URINE OUTPUT:  700 ml SPECIMENS: Placenta sent to pathology COMPLICATIONS: None immediate  PROCEDURE IN DETAIL:  The patient received intravenous antibiotics and had sequential compression devices applied to her lower extremities while in the preoperative area.  She was then taken to the operating room where epidural anesthesia was dosed up to surgical level and was found to be adequate. She was then placed in a dorsal supine position with a leftward tilt, and prepped and draped in a sterile manner.  A foley catheter was placed into her bladder and attached to constant  gravity, which drained clear fluid throughout.  After an adequate timeout was performed, a Pfannenstiel skin incision was made with scalpel and carried through to the underlying layer of fascia. The fascia was incised in the midline and this incision was extended bilaterally using the Mayo scissors. Kocher clamps were applied to the superior aspect of the fascial incision and the underlying rectus muscles were dissected off bluntly. A similar process was carried out on the inferior aspect of the facial incision. The rectus muscles were separated in the midline bluntly and the peritoneum was entered bluntly. An Alexis retractor was placed into the abdomen.  Attention was turned to the lower uterine segment where a transverse hysterotomy was made with a scalpel and extended bilaterally bluntly. The infant was successfully delivered, and cord was clamped and cut and infant was handed over to awaiting neonatology team. Uterine massage was then administered and the placenta delivered intact with three-vessel cord. The uterus was then cleared of clot and debris.  The hysterotomy was closed with 0 Vicryl in a running locked fashion, and an imbricating layer was also placed with a 0 Vicryl. Overall, excellent hemostasis was noted. The abdomen and the pelvis were cleared of all clot and debris. Hemostasis was confirmed on all surfaces.  The fascia was then closed using 0 Vicryl in a running fashion.  The skin was closed with 4-0 Vicryl. The patient tolerated the procedure well. Sponge, lap, instrument and needle counts were correct x 2. She was taken to the recovery room in stable condition.    Candelaria Celeste JEHIEL DO 08/09/2011 5:45 PM

## 2011-08-09 NOTE — Progress Notes (Signed)
Patient ID: Melissa West, female   DOB: 16-Mar-1991, 21 y.o.   MRN: [id number]   Addendum to last note. While note was being written, the FHT went to 90s for approx 10 minutes.  There was some intermittent recovery.  Maternal resuscitation efforts performed.  Pitocin off and terbutaline given.  FHT returned to baseline 140 with moderate variability. Discussed possibility of c/s in future. Patient consented for cesarean section.  Risks include but not limited to bleeding, infection, damage to intraabdominal organs, hysterectomy, DVT/PE.  All questions answered.  Pitocin will restart in 30 mins.

## 2011-08-09 NOTE — Anesthesia Procedure Notes (Signed)
Epidural Patient location during procedure: OB Start time: 08/09/2011 6:51 AM  Staffing Anesthesiologist: Brayton Caves R Performed by: anesthesiologist   Preanesthetic Checklist Completed: patient identified, site marked, surgical consent, pre-op evaluation, timeout performed, IV checked, risks and benefits discussed and monitors and equipment checked  Epidural Patient position: sitting Prep: site prepped and draped and DuraPrep Patient monitoring: continuous pulse ox and blood pressure Approach: midline Injection technique: LOR air and LOR saline  Needle:  Needle type: Tuohy  Needle gauge: 17 G Needle length: 9 cm Needle insertion depth: 7 cm Catheter type: closed end flexible Catheter size: 19 Gauge Catheter at skin depth: 12 cm Test dose: negative  Assessment Events: blood not aspirated, injection not painful, no injection resistance, negative IV test and no paresthesia  Additional Notes Patient identified.  Risk benefits discussed including failed block, incomplete pain control, headache, nerve damage, paralysis, blood pressure changes, nausea, vomiting, reactions to medication both toxic or allergic, and postpartum back pain.  Patient expressed understanding and wished to proceed.  All questions were answered.  Sterile technique used throughout procedure and epidural site dressed with sterile barrier dressing. No paresthesia or other complications noted.The patient did not experience any signs of intravascular injection such as tinnitus or metallic taste in mouth nor signs of intrathecal spread such as rapid motor block. Please see nursing notes for vital signs.

## 2011-08-09 NOTE — Progress Notes (Signed)
   Melissa West is a 21 y.o. G1P0 at [redacted]w[redacted]d  admitted for PROM  Subjective: Desires epidural  Objective: BP 112/72  Pulse 110  Temp(Src) 98.5 F (36.9 C) (Oral)  Resp 18  Ht 5\' 3"  (1.6 m)  Wt 93.498 kg (206 lb 2 oz)  BMI 36.51 kg/m2  SpO2 96%    FHT:  FHR: 120 bpm, variability: moderate,  accelerations:  Present,  decelerations:  Absent UC:   irregular, every 3-5 minutes SVE:   Dilation: 4 Effacement (%): 100 Station: -1 Exam by:: Cammy Copa, RN  Labs: Lab Results  Component Value Date   WBC 19.2* 08/09/2011   HGB 13.5 08/09/2011   HCT 40.0 08/09/2011   MCV 83.9 08/09/2011   PLT 203 08/09/2011    Assessment / Plan: PROM, early labor  Labor: Progressing normally Fetal Wellbeing:  Category I Pain Control:  Epidural Anticipated MOD:  NSVD  CRESENZO-DISHMAN,Fidelia Cathers 08/09/2011, 6:51 AM

## 2011-08-10 ENCOUNTER — Encounter (HOSPITAL_COMMUNITY): Payer: Self-pay | Admitting: *Deleted

## 2011-08-10 LAB — CBC
HCT: 31 % — ABNORMAL LOW (ref 36.0–46.0)
Hemoglobin: 10.4 g/dL — ABNORMAL LOW (ref 12.0–15.0)
MCHC: 33.5 g/dL (ref 30.0–36.0)
MCV: 85.2 fL (ref 78.0–100.0)
WBC: 20.1 10*3/uL — ABNORMAL HIGH (ref 4.0–10.5)

## 2011-08-10 LAB — CREATININE, SERUM
Creatinine, Ser: 0.78 mg/dL (ref 0.50–1.10)
GFR calc Af Amer: >90 mL/min (ref >90)
GFR calc non Af Amer: >90 mL/min (ref >90)

## 2011-08-10 NOTE — Anesthesia Postprocedure Evaluation (Signed)
  Anesthesia Post-op Note  Patient: Melissa West  Procedure(s) Performed: Procedure(s) (LRB): CESAREAN SECTION (N/A)  Patient Location: Mother/Baby  Anesthesia Type: Epidural  Level of Consciousness: awake, alert  and oriented  Airway and Oxygen Therapy: Patient Spontanous Breathing  Post-op Pain: none  Post-op Assessment: Post-op Vital signs reviewed, Patient's Cardiovascular Status Stable, No headache, No backache, No residual numbness and No residual motor weakness  Post-op Vital Signs: Reviewed and stable  Complications: No apparent anesthesia complications

## 2011-08-10 NOTE — Progress Notes (Signed)
Post Operative Day 1 Subjective: no complaints and tolerating PO Still has IV and foley.  Trying to breastfeed.  Objective: Blood pressure 88/50, pulse 80, temperature 98.2 F (36.8 C), temperature source Oral, resp. rate 18, height 5\' 3"  (1.6 m), weight 206 lb 2 oz (93.498 kg), SpO2 96.00%, unknown if currently breastfeeding.  Physical Exam:  General: alert, cooperative and no distress Lochia: appropriate Uterine Fundus: firm Incision: no significant drainage, Dressing dry and intact DVT Evaluation: No evidence of DVT seen on physical exam. SCDs on.   Basename 08/09/11 0035  HGB 13.5  HCT 40.0    Assessment/Plan: Plan for discharge tomorrow and Lactation consult May need to stay 3 days if not breastfeeding well. OOB today Dressing off IV out Foley out   LOS: 2 days   [facility] 08/10/2011, 6:19 AM

## 2011-08-10 NOTE — Addendum Note (Signed)
Addendum  created 08/10/11 1014 by Shanon Payor, CRNA   Modules edited:Notes Section

## 2011-08-10 NOTE — Progress Notes (Signed)
UR chart review completed.  

## 2011-08-11 MED ORDER — OXYCODONE-ACETAMINOPHEN 5-325 MG PO TABS: 1.0000 | ORAL_TABLET | ORAL | Status: AC | PRN

## 2011-08-11 MED ORDER — IBUPROFEN 600 MG PO TABS: 600.0000 mg | ORAL_TABLET | Freq: Four times a day (QID) | ORAL | Status: AC

## 2011-08-11 NOTE — Discharge Summary (Signed)
Saw patient and agree with note Melissa West

## 2011-08-11 NOTE — Progress Notes (Signed)
Sw referral received to assess pt's situation, as it was reported that she may be in need of additional services, due to her disability.  Pt was very defensive with this Sw, as she questioned the reason for the visit.  The pt told Sw that her mother has stayed overnight with her while here in the hospital and will be available to assist her once discharged.  The pt lives alone but states her mother "basically" stays with her every night.  Additionally, pt told Sw that she has other family that will be available to assist her and she did not seem to understand the reason for staffs concern.  When asked about her ability to change the infants diaper, she told Sw that it was the RN's job to do that while here in the hosptial.  Sw reassured pt that the medical team would provide appropriate care for her and the infant however would like to see her provide basic care (changing, preparing bottles and feeding) for the infant.  Sw attempted to explain the reason for concern & offer to look for agencies that may be able to assist however pt was resistant.  Pt appeared to be offended by concern/questions of support system/resources available to her.  This Sw apologized and was not able to assist this pt.

## 2011-08-11 NOTE — Discharge Summary (Signed)
Obstetric Discharge Summary Reason for Admission: rupture of membranes Prenatal Procedures: none Intrapartum Procedures: cesarean: low cervical, transverse, due to NRFHT Postpartum Procedures: none Complications-Operative and Postpartum: none Hemoglobin  Date Value Range Status  08/10/2011 10.4* 12.0-15.0 (g/dL) Final     REPEATED TO VERIFY     DELTA CHECK NOTED     HCT  Date Value Range Status  08/10/2011 31.0* 36.0-46.0 (%) Final    Physical Exam:  General: alert, cooperative, appears stated age and no distress Lochia: appropriate Uterine Fundus: firm Incision: dressings c/d/i DVT Evaluation: No evidence of DVT seen on physical exam.  Discharge Diagnoses: Term Pregnancy-delivered  Discharge Information: Date: 08/11/2011 Activity: unrestricted and pelvic rest Diet: routine Medications: Ibuprofen Condition: stable Instructions: refer to practice specific booklet Discharge to: home   Newborn Data:   Marene, Gilliam [[id number]]  Live born female  Birth Weight: 6 lb 11.6 oz (3050 g) APGAR: 9, 9   Suchan, Boy Fatimata [[id number]]  Live born female  Birth Weight:  APGAR: 9, 9  Home with mother.  Cherylanne Ardelean, MD 08/11/2011, 7:51 AM

## 2011-08-11 NOTE — Progress Notes (Signed)
Patient ID: Melissa West, female   DOB: 1990-12-02, 21 y.o.   MRN: [id number] In to see patient as nurse expressed some concern regarding patient's ability to care for herself and newborn infant.  Patient's nurse reports that she had to help the patient shower, change the infant's diapers as well as bottle feed the baby. Spoke with patient who admits to having a physical disability and having incisional pain. Patient stated to me that she lives alone and that her mother would be available to assist her in the evenings on her return from work. The father of the baby lives in another town. Patient was informed that social worker would come to see her and may be able to provide some assistance for her at her residence. Patient was informed that she is entitled to another hospital stay which may benefit her. Otherwise, patient was asked to demonstrate that she is able to tend to the infants need for a discharge later today.

## 2011-08-19 NOTE — Op Note (Signed)
Present for operation 

## 2012-07-02 ENCOUNTER — Emergency Department (HOSPITAL_COMMUNITY)
Admission: EM | Admit: 2012-07-02 | Discharge: 2012-07-02 | Disposition: A | Discharge status: Home / Self Care | Payer: Medicare Other | Attending: Emergency Medicine | Admitting: Emergency Medicine

## 2012-07-02 ENCOUNTER — Encounter (HOSPITAL_COMMUNITY): Payer: Self-pay

## 2012-07-02 ENCOUNTER — Emergency Department (HOSPITAL_COMMUNITY): Payer: Medicare Other

## 2012-07-02 DIAGNOSIS — S0003XA Contusion of scalp, initial encounter: Secondary | ICD-10-CM | POA: Insufficient documentation

## 2012-07-02 DIAGNOSIS — Z8669 Personal history of other diseases of the nervous system and sense organs: Secondary | ICD-10-CM | POA: Insufficient documentation

## 2012-07-02 DIAGNOSIS — W010XXA Fall on same level from slipping, tripping and stumbling without subsequent striking against object, initial encounter: Secondary | ICD-10-CM | POA: Insufficient documentation

## 2012-07-02 DIAGNOSIS — W1809XA Striking against other object with subsequent fall, initial encounter: Secondary | ICD-10-CM | POA: Insufficient documentation

## 2012-07-02 DIAGNOSIS — Y92009 Unspecified place in unspecified non-institutional (private) residence as the place of occurrence of the external cause: Secondary | ICD-10-CM | POA: Insufficient documentation

## 2012-07-02 DIAGNOSIS — S0083XA Contusion of other part of head, initial encounter: Secondary | ICD-10-CM | POA: Insufficient documentation

## 2012-07-02 DIAGNOSIS — Y9389 Activity, other specified: Secondary | ICD-10-CM | POA: Insufficient documentation

## 2012-07-02 MED ORDER — IBUPROFEN 800 MG PO TABS
800.0000 mg | ORAL_TABLET | Freq: Once | ORAL | Status: AC
  Administered 2012-07-02: 800 mg via ORAL
  Filled 2012-07-02: qty 1

## 2012-07-02 MED ORDER — IBUPROFEN 600 MG PO TABS: 600.0000 mg | ORAL_TABLET | Freq: Four times a day (QID) | ORAL | Status: DC | PRN

## 2012-07-02 NOTE — ED Notes (Signed)
Pt fell while walking in her house, states she said she fell backward and hit her head on the wall. Pt uses a crutch due to diagnosis of MS

## 2012-07-02 NOTE — ED Notes (Signed)
Pt states she lost balance and fell, hit head against side of wall, denies LOC, denies blurred vision, dizziness, or any other associated symptoms, Pt has MS and walkes with a crutch due to leg braces. Pt is AAx4, NAD, pupils equal and reactive.

## 2012-07-02 NOTE — ED Provider Notes (Signed)
History     [name]: [id number]  Arrival date & time 07/02/12  0132   First MD Initiated Contact with Patient 07/02/12 0138      Chief Complaint  Patient presents with  . Head Injury    (Consider location/radiation/quality/duration/timing/severity/associated sxs/prior treatment) HPI HX per PT.  has MS and use a crutch to walk at home, tonight at home tripped while walking and fell backwards striking her head against the wall.  No LOC,  no neck pain, no new weakness or numbness. Moderate sharp non radiating pain with a large knot. No other injury. No N/V. No vision changes Past Medical History  Diagnosis Date  . MS (multiple sclerosis)   . No pertinent past medical history     Past Surgical History  Procedure Laterality Date  . Cesarean section  08/09/2011    Procedure: CESAREAN SECTION;  Surgeon: Lesly Dukes, MD;  Location: [facility];  Service: Gynecology;  Laterality: N/A;    Family History  Problem Relation Age of Onset  . Diabetes Maternal Grandmother   . Hypertension Maternal Grandmother   . Kidney disease Maternal Grandmother     History  Substance Use Topics  . Smoking status: Never Smoker   . Smokeless tobacco: Never Used  . Alcohol Use: No    OB History   Grav Para Term Preterm Abortions TAB SAB Ect Mult Living   1 1 1  0 0 0 0 0 1 2      Review of Systems  Constitutional: Negative for fever and chills.  HENT: Negative for neck pain.   Eyes: Negative for visual disturbance.  Respiratory: Negative for shortness of breath.   Cardiovascular: Negative for chest pain.  Gastrointestinal: Negative for vomiting and abdominal pain.  Genitourinary: Negative for flank pain.  Musculoskeletal: Negative for back pain.  Skin: Negative for rash.  Neurological: Negative for seizures, syncope and speech difficulty.  All other systems reviewed and are negative.    Allergies  Review of patient's allergies indicates no known allergies.  Home Medications   Current  Outpatient Rx  Name  Route  Sig  Dispense  Refill  . Prenatal Vit-Fe Fumarate-FA (PRENATAL MULTIVITAMIN) TABS   Oral   Take 1 tablet by mouth daily.           LMP 06/20/2012  Physical Exam  Constitutional: She is oriented to person, place, and time. She appears well-developed and well-nourished.  HENT:  Head: Normocephalic.  Tender area of swelling posterior scalp. No lac or bleeding  Eyes: EOM are normal. Pupils are equal, round, and reactive to light.  Neck:  No cervical tenderness or deformity  Cardiovascular: Regular rhythm and intact distal pulses.   Pulmonary/Chest: Effort normal. No respiratory distress.  Musculoskeletal: Normal range of motion. She exhibits no edema.  Neurological: She is alert and oriented to person, place, and time. She has normal reflexes. She displays normal reflexes. No cranial nerve deficit. She exhibits normal muscle tone. Coordination normal.  Skin: Skin is warm and dry.    ED Course  Procedures (including critical care time)  Ct Head Wo Contrast  07/02/2012  *RADIOLOGY REPORT*  Clinical Data: 22 year old female with head injury and headache. History of multiple sclerosis.  CT HEAD WITHOUT CONTRAST  Technique:  Contiguous axial images were obtained from the base of the skull through the vertex without contrast.  Comparison: 04/25/2007 CT  Findings: New periventricular white matter hypodensities are compatible with MS lesions.  Abnormal appearance of the lateral ventricles is unchanged.  Dural calcifications are stable. There is no evidence of acute hemorrhage, mass lesion, mass effect, midline shift, hydrocephalus, or acute infarction.  The visualized bony calvarium is unremarkable.  There is no evidence of fracture.  IMPRESSION: Periventricular white matter hypodensities compatible with MS lesions - new since 04/25/2007.  No evidence of acute hemorrhage, infarction or fracture.   Original Report Authenticated By: Harmon Pier, M.D.    Motrin, ice. CT  head MDM  Head injury evaluated with CT as above.  Serial exams without changes - and discharge home and outpatient follow-up. Patient agrees to strict return precautions for head injury.  She ambulates at baseline in emergency apartment and is stable for discharge at this time        Sunnie Nielsen, MD 07/02/12 0246

## 2012-08-22 ENCOUNTER — Ambulatory Visit: Payer: Medicare Other | Admitting: Orthopedic Surgery

## 2012-08-24 ENCOUNTER — Ambulatory Visit: Payer: Medicare Other | Admitting: Orthopedic Surgery

## 2012-09-19 ENCOUNTER — Encounter: Payer: Self-pay | Admitting: Orthopedic Surgery

## 2012-09-19 ENCOUNTER — Ambulatory Visit (INDEPENDENT_AMBULATORY_CARE_PROVIDER_SITE_OTHER): Payer: Medicare Other | Admitting: Orthopedic Surgery

## 2012-09-19 VITALS — BP 88/62 | Ht 61.0 in | Wt 180.0 lb

## 2012-09-19 DIAGNOSIS — R29898 Other symptoms and signs involving the musculoskeletal system: Secondary | ICD-10-CM

## 2012-09-19 DIAGNOSIS — G35 Multiple sclerosis: Secondary | ICD-10-CM

## 2012-09-19 NOTE — Progress Notes (Signed)
Patient ID: Melissa West, female   DOB: 02-08-1991, 22 y.o.   MRN: [id number] Chief Complaint  Patient presents with  . Knee Pain    Right knee pain and instability   History: This is a 22 year old female who carries diagnosis of multiple sclerosis [facility] [place] [facility]. She says starting about 2011 she had the sudden onset of inability to ambulate with her right leg giving out. She complains of throbbing 9/10 constant pain all day worse when walking or standing improved with sitting associated with locking in extension and numbness from her knee down to her foot area she reports no treatment to date although I look in her notes and she's had some diclofenac for knee pain she had an x-ray which we can't see on the disc that was given to her. She is followed by a neurologist but says she does not have multiple sclerosis she doesn't believe it. I look at her CAT scan and it shows findings consistent with multiple sclerosis. She's wearing an ankle orthosis and she's using a walker with brakes. She denied any other symptoms on review of systems evaluation other than joint pain. Medications listed include hydrocodone and diclofenac and Prenatal Plus with iron vitamins  BP 88/62  Ht 5\' 1"  (1.549 m)  Wt 180 lb (81.647 kg)  BMI 34.03 kg/m2 General appearance she is a mesomorphic body habitus, she is oriented x3. Her mood and affect are normal although she is in denial about her disease process. She ambulates with a ankle orthosis and a walker. When the ankle orthosis is removed she has a weak dorsiflexor on the right and a bit of a foot drop. She has proximal weakness of the right thigh weakness in the knee extensors. She is normal proximal hip flexors on the left and normal knee function on the left. Her knee range of motion is normal her knee is stable she has a negative McMurray sign no joint effusion no tenderness. Skin is normal. Distal pulses right and left are intact with  normal temperature and both. She responds normally to palpation bilaterally.  Diagnosis Multiple sclerosis which is most likely contributing to her proximal weakness Possible disc herniation  Assessment and plan Recommend MRI of her lumbar spine. Since we do not handle lumbar disc problems we will refer her back to her clinic for further referral if needed. We will go ahead and start the process for the MRI in order that I will call her with her results and forward them to the clinic listed. This patient has no orthopedic issues at this time and no followups have been scheduled

## 2012-09-19 NOTE — Patient Instructions (Addendum)
Mri l spine call patient with results Multiple Sclerosis Multiple sclerosis (MS) is a disease of the central nervous system. Its cause is unknown. It is more common in the [place] states than in the [place] states. There is a higher incidence of MS in women. There is a wide variation in the symptoms (problems) of MS. This is because of the many different ways it affects the central nervous system. It often comes on in episodes or attacks. These attacks may last weeks to months. There may be long periods of nearly no problems between attacks. The main symptoms include visual problems (associated with eye pain), numbness, weakness, and paralysis in extremities (arms/hands and legs/feet). There may also be tremors and problems with balance and walking. The age when MS starts is variable. Advances in medicine continue to improve the treatment of this illness. There is no known cure for MS but there are medications that help. MS is not an inherited illness, although your risk of getting this disease is higher if you have a relative with MS. The best radiologic (x-ray) study for MS is an MRI (magnetic resonance imaging). There are medications available to decrease the number and frequency of attacks. SYMPTOMS  The symptoms of MS are caused by loss of insulation (myelin) of the nerves of the brain. When this happens, brain signals do not get transmitted properly or may not get transmitted at all. Some of the problems caused by this include:   Numbness.  Weakness.  Paralysis in extremities.  Visual problems, eye pain.  Balance problems.  Tremors. DIAGNOSIS  Your caregiver can do studies on you to make this diagnosis. This may include specialized X-rays and spinal fluid studies. HOME CARE INSTRUCTIONS   Take medications as directed by your caregiver. Baclofen is a drug commonly used to reduce muscle spasticity. Steroids are often used for short term relief.  Exercise as directed.  Use physical and  occupational therapy as directed by your caregiver. Careful attention to this medical care can help avoid depression.  See your caregiver if you begin to have problems with depression. This is a common problem in MS. Patients often continue to work many years after the diagnosis of MS. Document Released: 03/26/2000 Document Revised: 06/21/2011 Document Reviewed: 11/02/2006 [facility] Patient Information 2014 [place], [place].

## 2012-09-20 ENCOUNTER — Telehealth: Payer: Self-pay | Admitting: *Deleted

## 2012-09-20 NOTE — Telephone Encounter (Signed)
MRI- No Pre-cert required per Idelle Crouch with [place] [employer]. Appointment scheduled for 09/25/12 at 4:45 pm. Patient aware.

## 2012-09-25 ENCOUNTER — Ambulatory Visit (HOSPITAL_COMMUNITY): Payer: Medicare Other

## 2012-10-23 ENCOUNTER — Ambulatory Visit (HOSPITAL_COMMUNITY): Admission: RE | Admit: 2012-10-23 | Payer: Medicare Other | Source: Ambulatory Visit

## 2012-10-31 ENCOUNTER — Ambulatory Visit (HOSPITAL_COMMUNITY)
Admission: RE | Admit: 2012-10-31 | Discharge: 2012-10-31 | Disposition: A | Discharge status: Home / Self Care | Payer: Medicare Other | Source: Ambulatory Visit | Attending: Orthopedic Surgery | Admitting: Orthopedic Surgery

## 2012-10-31 DIAGNOSIS — G35 Multiple sclerosis: Secondary | ICD-10-CM | POA: Insufficient documentation

## 2012-10-31 DIAGNOSIS — M545 Low back pain, unspecified: Secondary | ICD-10-CM | POA: Insufficient documentation

## 2012-10-31 DIAGNOSIS — R29898 Other symptoms and signs involving the musculoskeletal system: Secondary | ICD-10-CM

## 2012-11-08 ENCOUNTER — Telehealth: Payer: Self-pay | Admitting: Orthopedic Surgery

## 2012-11-08 NOTE — Telephone Encounter (Signed)
Patient called for MRI results, Melissa West, from 10/31/12.  Her home ph# is [phone]

## 2012-11-16 NOTE — Telephone Encounter (Signed)
Routing to DR. Harrison  

## 2014-02-11 ENCOUNTER — Encounter: Payer: Self-pay | Admitting: Orthopedic Surgery

## 2014-04-11 ENCOUNTER — Encounter: Payer: Self-pay | Admitting: *Deleted

## 2014-04-24 DIAGNOSIS — G35 Multiple sclerosis: Secondary | ICD-10-CM | POA: Diagnosis not present

## 2014-05-22 DIAGNOSIS — G35 Multiple sclerosis: Secondary | ICD-10-CM | POA: Diagnosis not present

## 2014-05-23 ENCOUNTER — Encounter: Payer: Medicare Other | Admitting: Obstetrics & Gynecology

## 2014-05-30 DIAGNOSIS — G35 Multiple sclerosis: Secondary | ICD-10-CM | POA: Diagnosis not present

## 2014-06-19 DIAGNOSIS — Z5111 Encounter for antineoplastic chemotherapy: Secondary | ICD-10-CM | POA: Diagnosis not present

## 2014-06-19 DIAGNOSIS — G35 Multiple sclerosis: Secondary | ICD-10-CM | POA: Diagnosis not present

## 2014-06-21 DIAGNOSIS — R600 Localized edema: Secondary | ICD-10-CM | POA: Diagnosis not present

## 2014-06-21 DIAGNOSIS — G35 Multiple sclerosis: Secondary | ICD-10-CM | POA: Diagnosis not present

## 2014-06-21 DIAGNOSIS — G894 Chronic pain syndrome: Secondary | ICD-10-CM | POA: Diagnosis not present

## 2014-06-28 DIAGNOSIS — G35 Multiple sclerosis: Secondary | ICD-10-CM | POA: Diagnosis not present

## 2014-07-18 DIAGNOSIS — D899 Disorder involving the immune mechanism, unspecified: Secondary | ICD-10-CM | POA: Diagnosis not present

## 2014-07-18 DIAGNOSIS — G35 Multiple sclerosis: Secondary | ICD-10-CM | POA: Diagnosis not present

## 2014-07-29 DIAGNOSIS — G35 Multiple sclerosis: Secondary | ICD-10-CM | POA: Diagnosis not present

## 2014-08-14 DIAGNOSIS — G35 Multiple sclerosis: Secondary | ICD-10-CM | POA: Diagnosis not present

## 2014-08-19 DIAGNOSIS — Z13220 Encounter for screening for lipoid disorders: Secondary | ICD-10-CM | POA: Diagnosis not present

## 2014-08-19 DIAGNOSIS — G35 Multiple sclerosis: Secondary | ICD-10-CM | POA: Diagnosis not present

## 2014-08-19 DIAGNOSIS — R600 Localized edema: Secondary | ICD-10-CM | POA: Diagnosis not present

## 2014-08-19 DIAGNOSIS — Z131 Encounter for screening for diabetes mellitus: Secondary | ICD-10-CM | POA: Diagnosis not present

## 2014-08-19 DIAGNOSIS — E785 Hyperlipidemia, unspecified: Secondary | ICD-10-CM | POA: Diagnosis not present

## 2014-08-28 DIAGNOSIS — G35 Multiple sclerosis: Secondary | ICD-10-CM | POA: Diagnosis not present

## 2014-09-11 DIAGNOSIS — G35 Multiple sclerosis: Secondary | ICD-10-CM | POA: Diagnosis not present

## 2014-09-28 DIAGNOSIS — G35 Multiple sclerosis: Secondary | ICD-10-CM | POA: Diagnosis not present

## 2014-10-07 DIAGNOSIS — G35 Multiple sclerosis: Secondary | ICD-10-CM | POA: Diagnosis not present

## 2014-11-04 DIAGNOSIS — G35 Multiple sclerosis: Secondary | ICD-10-CM | POA: Diagnosis not present

## 2014-12-02 DIAGNOSIS — G35 Multiple sclerosis: Secondary | ICD-10-CM | POA: Diagnosis not present

## 2014-12-09 ENCOUNTER — Ambulatory Visit (HOSPITAL_COMMUNITY): Payer: Medicare Other | Attending: Neurology

## 2014-12-09 DIAGNOSIS — R531 Weakness: Secondary | ICD-10-CM | POA: Insufficient documentation

## 2014-12-09 DIAGNOSIS — Z7409 Other reduced mobility: Secondary | ICD-10-CM | POA: Diagnosis not present

## 2014-12-09 DIAGNOSIS — G35 Multiple sclerosis: Secondary | ICD-10-CM | POA: Diagnosis not present

## 2014-12-09 DIAGNOSIS — Z789 Other specified health status: Secondary | ICD-10-CM | POA: Insufficient documentation

## 2014-12-09 DIAGNOSIS — R2681 Unsteadiness on feet: Secondary | ICD-10-CM

## 2014-12-09 NOTE — Patient Instructions (Signed)
Began to explore options for teaching hamstrings stretches at home, but will more time to figure out a more successful technique. Educated pt on importance and utility of utilizing AFO for ambulation trials, and encouraged patient to find AFO, which she has not seen in several years.

## 2014-12-10 ENCOUNTER — Ambulatory Visit (HOSPITAL_COMMUNITY): Payer: Medicare Other

## 2014-12-10 DIAGNOSIS — R531 Weakness: Secondary | ICD-10-CM | POA: Diagnosis not present

## 2014-12-10 DIAGNOSIS — G35 Multiple sclerosis: Secondary | ICD-10-CM | POA: Diagnosis not present

## 2014-12-10 DIAGNOSIS — Z789 Other specified health status: Secondary | ICD-10-CM | POA: Diagnosis not present

## 2014-12-10 DIAGNOSIS — Z7409 Other reduced mobility: Secondary | ICD-10-CM

## 2014-12-10 DIAGNOSIS — R2681 Unsteadiness on feet: Secondary | ICD-10-CM

## 2014-12-10 NOTE — Therapy (Signed)
[facility] [address] [place], [place], [address] Phone: [phone]   Fax:  [phone]  Physical Therapy Evaluation  Patient Details  Name: Melissa West MRN: [id number] Date of Birth: 10-Sep-1990 Referring Provider:  Harlen Labs, MD  Encounter Date: 12/09/2014      PT End of Session - 12/09/14 1647    Visit Number 1   Number of Visits 16   Date for PT Re-Evaluation 01/09/15   Authorization Type Medicare//Medicaid   Authorization Time Period 12/09/14-02/08/15   Authorization - Visit Number 1   Authorization - Number of Visits 16   PT Start Time 1432   PT Stop Time 1535   PT Time Calculation (min) 63 min   Equipment Utilized During Treatment Gait belt   Activity Tolerance Patient tolerated treatment well   Behavior During Therapy [facility] for tasks assessed/performed      Past Medical History  Diagnosis Date  . MS (multiple sclerosis)   . No pertinent past medical history     Past Surgical History  Procedure Laterality Date  . Cesarean section  08/09/2011    Procedure: CESAREAN SECTION;  Surgeon: Lesly Dukes, MD;  Location: [facility];  Service: Gynecology;  Laterality: N/A;    There were no vitals filed for this visit.  Visit Diagnosis:  Multiple sclerosis  Impaired mobility and activities of daily living  Generalized weakness  Poor tolerance for activity  Unsteadiness on feet      Subjective Assessment - 12/09/14 1613    Subjective Pt reports 7 year history of MS. Pt reports that she feels that recently her ability to stand and maintain upright duringtransfers has gotten worse, that she is more weak and balance is worse.    Pertinent History Pt has had MS for seven years, with a constant gradual decline. Pt denies any notable exacerbations of Sx. Pt lives c 3yo son, and gets help c childcare from mother and aunt. Pt uses WC for most mobility in home, except for toiletting due to space restrictions.     Limitations Standing   How long can you sit comfortably? Pt is typically seated, as she uses WC for mobility.    How long can you stand comfortably? 60 seconds   How long can you walk comfortably? currently unable to do so for 1-2 years.    Patient Stated Goals improve stability on feet for transfers and ambulation.    Currently in Pain? No/denies            [facility] PT Assessment - 12/10/14 0001    Assessment   Medical Diagnosis MS   Onset Date/Surgical Date 12/31/14   Hand Dominance Left   Prior Therapy years ago    Prior Function   Level of Independence Independent with basic ADLs;Needs assistance with homemaking  requires some assistance with taking care of 3yo son.    Observation/Other Assessments   Focus on Therapeutic Outcomes (FOTO)  37   Sensation   Light Touch Impaired Detail  Mild impairments and allodynia around lat/post R ankle   Posture/Postural Control   Posture/Postural Control Postural limitations   Postural Limitations --  While seated pt is limited in R leaninng > L leaning   Posture Comments Pt does not withstand moderate perturbations in A/P, Lat dirtections   AROM   Right Ankle Dorsiflexion 0   PROM   Overall PROM  Deficits   Left Ankle Dorsiflexion -5   Strength   Right Hip Flexion 2+/5  Right Hip External Rotation  2   Left Hip Flexion 3+/5   Left Hip External Rotation  2   Right Knee Flexion 2/5   Right Knee Extension 4/5   Left Knee Flexion 2/5   Left Knee Extension 5/5   Right Ankle Dorsiflexion 1/5   Left Ankle Dorsiflexion 2/5   Flexibility   Hamstrings L 140*, R 135*   Bed Mobility   Supine to Sit 4: Min assist   Sit to Supine 5: Supervision   Transfers   Transfers --  WC to mat   Comments Pt appears very unsteady, poor safety awareness.   Ambulation/Gait   Ambulation Distance (Feet) 6 Feet   Assistive device Rolling walker   Gait Comments Assistance given c RW, pt lost control and flops onto mat                            PT Short Term Goals - 12/10/14 0841    PT SHORT TERM GOAL #1   Title Patient will tolerate full stance c RW for 2 minutes, followed by controlled decent to chair.    Baseline 45 seconds    Time 4   Period Weeks   Status New   PT SHORT TERM GOAL #2   Title Pt will demonstrate indep in all HEP activities.    Time 2   Period Weeks   Status New   PT SHORT TERM GOAL #3   Title Pt will demonstrae improved HS length bilat > 155 degrees.    Baseline <140*    Time 4   Period Weeks   Status New           PT Long Term Goals - 12/10/14 1048    PT LONG TERM GOAL #1   Title Pt will tolerrate full upright stance c RW for greater than 4 minutes.    Baseline 45sec   Time 8   Period Weeks   Status New   PT LONG TERM GOAL #2   Title Pt will demonstrate full indep in all transfers and bed mobility in order to decrease caregiver burden.    Time 8   Period Weeks   Status New   PT LONG TERM GOAL #3   Title Pt will demonstrate improvement in strength to atleast 3/5 strength in all BLE groups.    Baseline See evaluation note.    Time 8   Period Weeks   Status New               Plan - 12/10/14 7846    Clinical Impression Statement Pt is a 24yo black female with a PMH MS. Pt presenting with impairments in strength, balance, caore stability. gross motor control, and range of motion, limiting ability to perform ADL and household mobility at [facility]. Pt will benefit from skilled intervention to improve strength, range of motion, and to provide education to increase pt independence, quality of life, and to decrease caregiver burden.    Pt will benefit from skilled therapeutic intervention in order to improve on the following deficits Abnormal gait;Decreased endurance;Impaired sensation;Impaired tone;Decreased activity tolerance;Decreased balance;Decreased knowledge of use of DME;Decreased mobility;Decreased range of motion;Decreased safety  awareness;Difficulty walking;Decreased strength;Increased muscle spasms;Impaired flexibility   Rehab Potential Good   PT Frequency 2x / week   PT Duration 8 weeks   PT Treatment/Interventions ADLs/Self Care Home Management;Biofeedback;Cryotherapy;Electrical Stimulation;Gait training;Functional mobility Network engineer;Therapeutic activities;Therapeutic exercise;Balance training;Patient/family education;Passive range of motion;Energy conservation  PT Next Visit Plan Teach home stretching program for hamstrings and calves, start strengthening and improve activity tolerance to transfers annd FWB standing.    Consulted and Agree with Plan of Care Patient          G-Codes - January 01, 2015 1053    Functional Assessment Tool Used FOTO    Functional Limitation Mobility: Walking and moving around   Mobility: Walking and Moving Around Current Status [phone]) At least 60 percent but less than 80 percent impaired, limited or restricted   Mobility: Walking and Moving Around Goal Status [phone]) At least 40 percent but less than 60 percent impaired, limited or restricted       Problem List Patient Active Problem List   Diagnosis Date Noted  . MS (multiple sclerosis)   . Neurological deficit present 11/11/2010    Karisa Nesser C 01-01-15, 10:57 AM  10:58 AM  Rosamaria Lints, PT, DPT [facility] [place] License # [id number]       [facility] [address] [place], [place], [address] Phone: [phone]   Fax:  [phone]

## 2014-12-10 NOTE — Patient Instructions (Signed)
Hamstring: Stretch (Long Sitting)   With left leg straight, tuck opposite foot near groin. Reach down until stretch is felt in back of thigh. Make sure to have toes straight up towards ceiling.  Hold 30 seconds. Relax. Repeat 3 times. Do 2 times a day. Repeat with other leg.  Copyright  VHI. All rights reserved.   Ankle Plantar Flexion: Long-Sitting (Single Leg)   Loop a sheet around foot of straight leg, anchor with one hand. Leg straight, point toes upward..  Pull on sheet until you feel a stretch on calf muscles and hold for  30 seconds Repeat 3 times per set. Repeat with other leg. http://tub.exer.us/216   Copyright  VHI. All rights reserved.

## 2014-12-10 NOTE — Therapy (Signed)
[facility] [address] [place], [place], [address] Phone: [phone]   Fax:  [phone]  Physical Therapy Treatment  Patient Details  Name: Melissa West MRN: [id number] Date of Birth: 10/01/1990 Referring Provider:  Harlen Labs, MD  Encounter Date: 12/10/2014      PT End of Session - 12/10/14 1439    Visit Number 2   Number of Visits 16   Date for PT Re-Evaluation 01/09/15   Authorization Type Medicare//Medicaid   Authorization Time Period 12/09/14-02/08/15   Authorization - Visit Number 2   Authorization - Number of Visits 16   PT Start Time 1400   PT Stop Time 1432   PT Time Calculation (min) 32 min   Equipment Utilized During Treatment Gait belt   Activity Tolerance Patient tolerated treatment well   Behavior During Therapy [facility] for tasks assessed/performed      Past Medical History  Diagnosis Date  . MS (multiple sclerosis)   . No pertinent past medical history     Past Surgical History  Procedure Laterality Date  . Cesarean section  08/09/2011    Procedure: CESAREAN SECTION;  Surgeon: Lesly Dukes, MD;  Location: [facility];  Service: Gynecology;  Laterality: N/A;    There were no vitals filed for this visit.  Visit Diagnosis:  Impaired mobility and activities of daily living  Generalized weakness  Poor tolerance for activity  Unsteadiness on feet  Multiple sclerosis      Subjective Assessment - 12/10/14 1414    Subjective Pain free today, reports minimal difficutly with transfers at home.     Currently in Pain? No/denies          [facility] Adult PT Treatment/Exercise - 12/10/14 1437    Bed Mobility   Rolling Right 5: Supervision   Rolling Left 5: Supervision   Left Sidelying to Sit 4: Min assist   Supine to Sit 4: Min assist   Sit to Supine 5: Supervision   Transfers   Transfers Stand Pivot Transfers  WC to mat   Transfer Cueing handplacement   Comments Pt appears very unsteady, poor  safety awareness.   Ambulation/Gait   Ambulation Distance (Feet) 6 Feet   Assistive device Rolling walker   Gait Comments Assistance given c RW, pt lost control and flops onto mat   Posture/Postural Control   Posture/Postural Control Postural limitations   Postural Limitations --  While seated pt is limited in R leaninng > L leaning   Posture Comments Pt does not withstand moderate perturbations in A/P, Lat dirtections   Exercises   Exercises Knee/Hip   Knee/Hip Exercises: Stretches   Active Hamstring Stretch Both;3 reps;30 seconds   Active Hamstring Stretch Limitations long sitting   Gastroc Stretch Both;3 reps;30 seconds   Gastroc Stretch Limitations long sitting   Knee/Hip Exercises: Seated   Other Seated Knee/Hip Exercises heel raises 10x   Knee/Hip Exercises: Supine   Other Supine Knee/Hip Exercises ankle pumps 10x   Other Supine Knee/Hip Exercises gluteal sets 10x             PT Short Term Goals - 12/10/14 6962    PT SHORT TERM GOAL #1   Title Patient will tolerate full stance c RW for 2 minutes, followed by controlled decent to chair.    Baseline 45 seconds    Time 4   Period Weeks   Status New   PT SHORT TERM GOAL #2   Title Pt will demonstrate indep in all  HEP activities.    Time 2   Period Weeks   Status New   PT SHORT TERM GOAL #3   Title Pt will demonstrae improved HS length bilat > 155 degrees.    Baseline <140*    Time 4   Period Weeks   Status New           PT Long Term Goals - 12/10/14 1048    PT LONG TERM GOAL #1   Title Pt will tolerrate full upright stance c RW for greater than 4 minutes.    Baseline 45sec   Time 8   Period Weeks   Status New   PT LONG TERM GOAL #2   Title Pt will demonstrate full indep in all transfers and bed mobility in order to decrease caregiver burden.    Time 8   Period Weeks   Status New   PT LONG TERM GOAL #3   Title Pt will demonstrate improvement in strength to atleast 3/5 strength in all BLE groups.     Baseline See evaluation note.    Time 8   Period Weeks   Status New               Plan - 12/10/14 1451    Clinical Impression Statement Pt late for apt today.  Initially reviewed goals, established HEP stretches program for hamstrings and calves and copy of evaluation given to pt.  Pt demonstrated unsteady and difficutl techniques with transfers in and out of WC to mat.  Pt educated on techniques to complete with increased ease. Strengthening exercises complete for LE with min cueing for form with exercise.  No reports of pain through session.   PT Next Visit Plan Review complaince with HEP stretches for hamstrings and calves,  Next session begin strengthening and improve activity tolerance to transfers annd FWB standing.       Problem List Patient Active Problem List   Diagnosis Date Noted  . MS (multiple sclerosis)   . Neurological deficit present 11/11/2010   Becky Sax, LPTA; CBIS [phone]  Juel Burrow 12/10/2014, 3:58 PM  [facility] [address] [place], [place], [address] Phone: [phone]   Fax:  [phone]

## 2014-12-17 ENCOUNTER — Ambulatory Visit (HOSPITAL_COMMUNITY): Payer: Medicare Other | Attending: Neurology

## 2014-12-17 DIAGNOSIS — Z7409 Other reduced mobility: Secondary | ICD-10-CM | POA: Diagnosis not present

## 2014-12-17 DIAGNOSIS — Z789 Other specified health status: Secondary | ICD-10-CM | POA: Diagnosis not present

## 2014-12-17 DIAGNOSIS — G35 Multiple sclerosis: Secondary | ICD-10-CM | POA: Insufficient documentation

## 2014-12-17 DIAGNOSIS — R531 Weakness: Secondary | ICD-10-CM

## 2014-12-17 DIAGNOSIS — R2681 Unsteadiness on feet: Secondary | ICD-10-CM

## 2014-12-17 NOTE — Patient Instructions (Signed)
  Seated in WC with socked foot on book or hard surfaced floor, move foot forward and back ward, bending and straightening the knee. 20 times on each side.

## 2014-12-17 NOTE — Therapy (Addendum)
[facility] [address] [place], [place], [address] Phone: [phone]   Fax:  [phone]  Physical Therapy Evaluation  Patient Details  Name: Melissa West MRN: [id number] Date of Birth: [birth year] Referring Provider:  Harlen Labs, MD  Encounter Date: 12/09/2014      PT End of Session - 12/17/14 1634    Visit Number 3   Number of Visits 16   Date for PT Re-Evaluation 01/09/15   Authorization Type Medicare/Medicaid   Authorization Time Period 12/09/14-02/08/15   Authorization - Visit Number 3   Authorization - Number of Visits 16   PT Start Time 1527   PT Stop Time 1624   PT Time Calculation (min) 57 min   Equipment Utilized During Treatment Gait belt   Activity Tolerance Patient tolerated treatment well   Behavior During Therapy [facility] for tasks assessed/performed      Past Medical History  Diagnosis Date  . MS (multiple sclerosis)   . No pertinent past medical history     Past Surgical History  Procedure Laterality Date  . Cesarean section  08/09/2011    Procedure: CESAREAN SECTION;  Surgeon: Lesly Dukes, MD;  Location: [facility];  Service: Gynecology;  Laterality: N/A;    There were no vitals filed for this visit.  Visit Diagnosis:  Multiple sclerosis - Plan: PT plan of care cert/re-cert  Impaired mobility and activities of daily living - Plan: PT plan of care cert/re-cert  Generalized weakness - Plan: PT plan of care cert/re-cert  Poor tolerance for activity - Plan: PT plan of care cert/re-cert  Unsteadiness on feet - Plan: PT plan of care cert/re-cert      Subjective Assessment - 12/17/14 1537    Subjective Pt reports no pain today. Reports she has been performing HEP as given, but is having difficulty describing how to perfom and unabel to perform.    Pertinent History Pt has had MS for seven years, with a constant gradual decline. Pt denies any notable exacerbations of Sx. Pt lives c 3yo son, and gets  help c childcare from mother and aunt. Pt uses WC for most mobility in home, except for toiletting due to space restrictions.    Limitations Standing   How long can you sit comfortably? Pt is typically seated, as she uses WC for mobility.    Patient Stated Goals improve stability on feet for transfers and ambulation.    Currently in Pain? No/denies                       [facility] Adult PT Treatment/Exercise - 12/17/14 0001    Transfers   Transfers Sit to Stand   Sit to Stand 4: Min guard;With upper extremity assist  2x8 c RW.    Knee/Hip Exercises: Stretches   Active Hamstring Stretch Both;3 reps;30 seconds   Active Hamstring Stretch Limitations long sitting   Gastroc Stretch Both;3 reps;30 seconds   Gastroc Stretch Limitations long sitting   Knee/Hip Exercises: Supine   Short Arc [employer];Both;2 sets;10 reps  MinA on R, Supervision for L   Bridges Limitations 2x10, stabilizing feet   inititaion/isometrics    Other Supine Knee/Hip Exercises Hip flexion , knees bent bilat  2x10 Mod-maxA c eccentric contraction.    Other Supine Knee/Hip Exercises Manually resisted clamshells  2x10    Ankle Exercises: Seated   Toe Raise 10 reps  2x10 AAROM, min-modA for full available range.  Balance Exercises - 12/17/14 1612    Balance Exercises: Seated   Dynamic Sitting Reaching outside base of support  20x bilat forward reach, 20x bilat lateral reach   Other Seated Exercises 20x BUE floor touch + hands in air           PT Education - 12/17/14 1634    Education provided Yes   Education Details Importance of regular stretching.    Person(s) Educated Patient   Methods Explanation   Comprehension Verbalized understanding          PT Short Term Goals - 12/10/14 1642    PT SHORT TERM GOAL #1   Title Patient will tolerate full stance c RW for 2 minutes, followed by controlled decent to chair.    PT SHORT TERM GOAL #2   Title Pt will demonstrate  indep in all HEP activities.    PT SHORT TERM GOAL #3   Title Pt will demonstrae improved HS length bilat > 155 degrees.            PT Long Term Goals - 12/10/14 1646    PT LONG TERM GOAL #1   Title Pt will tolerrate full upright stance c RW for greater than 4 minutes.    PT LONG TERM GOAL #3   Title Pt will demonstrate improvement in strength to atleast 3/5 strength in all BLE groups.                Plan - 12/17/14 1636    Clinical Impression Statement Pt continues to make progress in active mobility during therex activities. Pt continues to have difficulty with fine motor coordiantion and fatigues easily during exercises. Pt demonstrating improved ability to perform HEP hamstrings stretches but is served well with reminder on how to perform. Pt likely will benefit from use of AFO for longer-term goals of short distance ambulation,, but is inapporopriate at this time until ankle ROM is improved.to at least 0 degrees of dorsiflexion.    Pt will benefit from skilled therapeutic intervention in order to improve on the following deficits Abnormal gait;Decreased endurance;Impaired sensation;Impaired tone;Decreased activity tolerance;Decreased balance;Decreased knowledge of use of DME;Decreased mobility;Decreased range of motion;Decreased safety awareness;Difficulty walking;Decreased strength;Increased muscle spasms;Impaired flexibility   Rehab Potential Good   PT Frequency 2x / week   PT Duration 8 weeks   PT Treatment/Interventions ADLs/Self Care Home Management;Biofeedback;Cryotherapy;Electrical Stimulation;Gait training;Functional mobility Network engineer;Therapeutic activities;Therapeutic exercise;Balance training;Patient/family education;Passive range of motion;Energy conservation   PT Next Visit Plan Review complaince with HEP stretches for hamstrings and calves,  Next session begin strengthening and improve activity tolerance to transfers annd FWB standing.    PT Home  Exercise Plan addedd seated knee flexion and extension, close chained, foot on pillowcase.    Consulted and Agree with Plan of Care Patient     PT G-Codes: FOTO used Mobility Current: CL Mobility Goal: CK       Problem List Patient Active Problem List   Diagnosis Date Noted  . MS (multiple sclerosis)   . Neurological deficit present 11/11/2010    Buccola,Allan C 12/17/2014, 4:48 PM  [facility] [address] [place], [place], [address] Phone: [phone]   Fax:  [phone]  8:41 AM  Rosamaria Lints, PT, DPT [place] License # [id number]

## 2014-12-17 NOTE — Therapy (Signed)
[facility] [address] [place], [place], [address] Phone: [phone]   Fax:  [phone]  Physical Therapy Treatment  Patient Details  Name: Melissa West MRN: [id number] Date of Birth: [birth year] Referring Provider:  Harlen Labs, MD  Encounter Date: 12/17/2014      PT End of Session - 12/17/14 1634    Visit Number 3   Number of Visits 16   Date for PT Re-Evaluation 01/09/15   Authorization Type Medicare/Medicaid   Authorization Time Period 12/09/14-02/08/15   Authorization - Visit Number 3   Authorization - Number of Visits 16   PT Start Time 1527   PT Stop Time 1624   PT Time Calculation (min) 57 min   Equipment Utilized During Treatment Gait belt   Activity Tolerance Patient tolerated treatment well   Behavior During Therapy [facility] for tasks assessed/performed      Past Medical History  Diagnosis Date  . MS (multiple sclerosis)   . No pertinent past medical history     Past Surgical History  Procedure Laterality Date  . Cesarean section  08/09/2011    Procedure: CESAREAN SECTION;  Surgeon: Lesly Dukes, MD;  Location: [facility];  Service: Gynecology;  Laterality: N/A;    There were no vitals filed for this visit.  Visit Diagnosis:  Impaired mobility and activities of daily living  Generalized weakness  Poor tolerance for activity  Unsteadiness on feet  Multiple sclerosis      Subjective Assessment - 12/17/14 1537    Subjective Pt reports no pain today. Reports she has been performing HEP as given, but is having difficulty describing how to perfom and unabel to perform.    Pertinent History Pt has had MS for seven years, with a constant gradual decline. Pt denies any notable exacerbations of Sx. Pt lives c 3yo son, and gets help c childcare from mother and aunt. Pt uses WC for most mobility in home, except for toiletting due to space restrictions.    Limitations Standing   How long can you sit comfortably?  Pt is typically seated, as she uses WC for mobility.    Patient Stated Goals improve stability on feet for transfers and ambulation.    Currently in Pain? No/denies                         [facility] Adult PT Treatment/Exercise - 12/17/14 0001    Transfers   Transfers Sit to Stand   Sit to Stand 4: Min guard;With upper extremity assist  2x8 c RW.    Knee/Hip Exercises: Stretches   Active Hamstring Stretch Both;3 reps;30 seconds   Active Hamstring Stretch Limitations long sitting   Gastroc Stretch Both;3 reps;30 seconds   Gastroc Stretch Limitations long sitting   Knee/Hip Exercises: Supine   Short Arc [employer];Both;2 sets;10 reps  MinA on R, Supervision for L   Bridges Limitations 2x10, stabilizing feet   inititaion/isometrics    Other Supine Knee/Hip Exercises Hip flexion , knees bent bilat  2x10 Mod-maxA c eccentric contraction.    Other Supine Knee/Hip Exercises Manually resisted clamshells  2x10    Ankle Exercises: Seated   Toe Raise 10 reps  2x10 AAROM, min-modA for full available range.              Balance Exercises - 12/17/14 1612    Balance Exercises: Seated   Dynamic Sitting Reaching outside base of support  20x bilat forward reach, 20x  bilat lateral reach   Other Seated Exercises 20x BUE floor touch + hands in air           PT Education - 12/17/14 1634    Education provided Yes   Education Details Importance of regular stretching.    Person(s) Educated Patient   Methods Explanation   Comprehension Verbalized understanding          PT Short Term Goals - 12/10/14 1642    PT SHORT TERM GOAL #1   Title Patient will tolerate full stance c RW for 2 minutes, followed by controlled decent to chair.    PT SHORT TERM GOAL #2   Title Pt will demonstrate indep in all HEP activities.    PT SHORT TERM GOAL #3   Title Pt will demonstrae improved HS length bilat > 155 degrees.            PT Long Term Goals - 12/10/14 1646    PT LONG  TERM GOAL #1   Title Pt will tolerrate full upright stance c RW for greater than 4 minutes.    PT LONG TERM GOAL #3   Title Pt will demonstrate improvement in strength to atleast 3/5 strength in all BLE groups.                Plan - 12/17/14 1636    Clinical Impression Statement Pt continues to make progress in active mobility during therex activities. Pt continues to have difficulty with fine motor coordiantion and fatigues easily during exercises. Pt demonstrating improved ability to perform HEP hamstrings stretches but is served well with reminder on how to perform. Pt likely will benefit from use of AFO for longer-term goals of short distance ambulation,, but is inapporopriate at this time until ankle ROM is improved.to at least 0 degrees of dorsiflexion.    Pt will benefit from skilled therapeutic intervention in order to improve on the following deficits Abnormal gait;Decreased endurance;Impaired sensation;Impaired tone;Decreased activity tolerance;Decreased balance;Decreased knowledge of use of DME;Decreased mobility;Decreased range of motion;Decreased safety awareness;Difficulty walking;Decreased strength;Increased muscle spasms;Impaired flexibility   Rehab Potential Good   PT Frequency 2x / week   PT Duration 8 weeks   PT Treatment/Interventions ADLs/Self Care Home Management;Biofeedback;Cryotherapy;Electrical Stimulation;Gait training;Functional mobility Network engineer;Therapeutic activities;Therapeutic exercise;Balance training;Patient/family education;Passive range of motion;Energy conservation   PT Next Visit Plan Review complaince with HEP stretches for hamstrings and calves,  Next session begin strengthening and improve activity tolerance to transfers annd FWB standing.    PT Home Exercise Plan addedd seated knee flexion and extension, close chained, foot on pillowcase.    Consulted and Agree with Plan of Care Patient        Problem List Patient Active Problem  List   Diagnosis Date Noted  . MS (multiple sclerosis)   . Neurological deficit present 11/11/2010    Laquan Beier C 12/17/2014, 4:42 PM  4:42 PM  Rosamaria Lints, PT, DPT [place] License # [id number]       [facility] [address] [place], [place], [address] Phone: [phone]   Fax:  [phone]

## 2014-12-19 ENCOUNTER — Ambulatory Visit (HOSPITAL_COMMUNITY): Payer: Medicare Other | Admitting: Physical Therapy

## 2014-12-24 ENCOUNTER — Ambulatory Visit (HOSPITAL_COMMUNITY): Payer: Medicare Other | Admitting: Physical Therapy

## 2014-12-26 ENCOUNTER — Telehealth (HOSPITAL_COMMUNITY): Payer: Self-pay

## 2014-12-26 ENCOUNTER — Encounter (HOSPITAL_COMMUNITY): Payer: Medicare Other | Admitting: Physical Therapy

## 2014-12-26 ENCOUNTER — Ambulatory Visit (HOSPITAL_COMMUNITY): Payer: Medicare Other

## 2014-12-26 DIAGNOSIS — R531 Weakness: Secondary | ICD-10-CM | POA: Diagnosis not present

## 2014-12-26 DIAGNOSIS — G35 Multiple sclerosis: Secondary | ICD-10-CM

## 2014-12-26 DIAGNOSIS — Z789 Other specified health status: Secondary | ICD-10-CM | POA: Diagnosis not present

## 2014-12-26 DIAGNOSIS — R2681 Unsteadiness on feet: Secondary | ICD-10-CM | POA: Diagnosis not present

## 2014-12-26 DIAGNOSIS — Z7409 Other reduced mobility: Secondary | ICD-10-CM | POA: Diagnosis not present

## 2014-12-26 NOTE — Therapy (Addendum)
[place], [place], [address] Phone: [phone]   Fax:  [phone]  Physical Therapy Treatment  Patient Details  Name: Melissa West MRN: [id number] Date of Birth: [birth year] Referring Provider:  Nolene Ebbs, MD  Encounter Date: 12/26/2014      PT End of Session - 12/26/14 1541    Visit Number 4   Number of Visits 16   Date for PT Re-Evaluation 01/09/15   Authorization Type Medicare/Medicaid   Authorization Time Period 12/09/14-02/08/15   Authorization - Visit Number 4   Authorization - Number of Visits 16   PT Start Time 7510   PT Stop Time 1630   PT Time Calculation (min) 55 min   Equipment Utilized During Treatment Gait belt   Activity Tolerance Patient tolerated treatment well   Behavior During Therapy [facility] for tasks assessed/performed      Past Medical History  Diagnosis Date  . MS (multiple sclerosis)   . No pertinent past medical history     Past Surgical History  Procedure Laterality Date  . Cesarean section  08/09/2011    Procedure: CESAREAN SECTION;  Surgeon: Guss Bunde, MD;  Location: [facility];  Service: Gynecology;  Laterality: N/A;    There were no vitals filed for this visit.  Visit Diagnosis:  Impaired mobility and activities of daily living  Generalized weakness  Poor tolerance for activity  Unsteadiness on feet  Multiple sclerosis      Subjective Assessment - 12/26/14 1540    Subjective Pain free, reports compliance with HEP 3-4x a week   Currently in Pain? No/denies           [facility] Adult PT Treatment/Exercise - 12/26/14 0001    Transfers   Transfers Sit to Stand;Stand Pivot Transfers   Sit to Stand 4: Min guard;With upper extremity assist   Knee/Hip Exercises: Stretches   Active Hamstring Stretch Both;3 reps;30 seconds   Active Hamstring Stretch Limitations long sitting   Gastroc Stretch Both;3 reps;30 seconds   Gastroc Stretch Limitations long  sitting   Knee/Hip Exercises: Seated   Long Arc [employer] Both;10 reps   Heel Slides Both;AAROM;10 reps   Other Seated Knee/Hip Exercises heel raises 10x; Toe raises AAROM 10x   Knee/Hip Exercises: Supine   Heel Slides AAROM;10 reps   Heel Slides Limitations AROM for extension, AAROM for flexion   Bridges Limitations 2x10, stabilizing feet             PT Short Term Goals - 12/10/14 1642    PT SHORT TERM GOAL #1   Title Patient will tolerate full stance c RW for 2 minutes, followed by controlled decent to chair.    PT SHORT TERM GOAL #2   Title Pt will demonstrate indep in all HEP activities.    PT SHORT TERM GOAL #3   Title Pt will demonstrae improved HS length bilat > 155 degrees.            PT Long Term Goals - 12/10/14 1646    PT LONG TERM GOAL #1   Title Pt will tolerrate full upright stance c RW for greater than 4 minutes.    PT LONG TERM GOAL #3   Title Pt will demonstrate improvement in strength to atleast 3/5 strength in all BLE groups.                Plan - 12/26/14 1632    Clinical Impression Statement Session focus on improving functional mobiltiy with  transfers and bed mobiltiy.  Pt able to demonstrate safe mechanics with stand pivot transfer and sliding from mat to [facility] with min guard.  Pt continues to demonstrate tight hamstrings and calves, able to demonstrate appropriate long sit stretches with min A.  Added heel slides for hamsttring  activatin with AAROM required due to weaknes.  Pt given HEP for heel slides in wheelchair.  Pt limited by fatigue with activities, no reports of pain.     PT Next Visit Plan Review complaince with HEP stretches for hamstrings and calves,  Next session begin strengthening and improve activity tolerance to transfers annd FWB standing.         Problem List Patient Active Problem List   Diagnosis Date Noted  . MS (multiple sclerosis)   . Neurological deficit present 11/11/2010   Ihor Austin, [place]; [place]    Aldona Lento 12/26/2014, 4:39 PM  [facility] [address] [place], [place], [address] Phone: [phone]   Fax:  [phone]    PHYSICAL THERAPY DISCHARGE SUMMARY  Visits from Start of Care: 4  Current functional level related to goals / functional outcomes: No significant change   Remaining deficits: Mobility/strength   Education / Equipment: HEP  Plan: Patient agrees to discharge.  Patient goals were not met. Patient is being discharged due to not returning since the last visit.  ?????       Rayetta Humphrey, [place] CLT [phone]

## 2014-12-30 DIAGNOSIS — G35 Multiple sclerosis: Secondary | ICD-10-CM | POA: Diagnosis not present

## 2014-12-30 DIAGNOSIS — Z79899 Other long term (current) drug therapy: Secondary | ICD-10-CM | POA: Diagnosis not present

## 2014-12-31 ENCOUNTER — Telehealth (HOSPITAL_COMMUNITY): Payer: Self-pay | Admitting: Physical Therapy

## 2014-12-31 ENCOUNTER — Ambulatory Visit (HOSPITAL_COMMUNITY): Payer: Medicare Other | Admitting: Physical Therapy

## 2014-12-31 ENCOUNTER — Encounter (HOSPITAL_COMMUNITY): Payer: Medicare Other | Admitting: Physical Therapy

## 2014-12-31 NOTE — Telephone Encounter (Signed)
Patient a no show for today's appointment. Attempted to call patient but both available numbers were not in service.  Nedra Hai PT, DPT [phone]

## 2014-12-31 NOTE — Telephone Encounter (Signed)
Patient donot have transportation °

## 2015-01-02 ENCOUNTER — Encounter (HOSPITAL_COMMUNITY): Payer: Medicare Other

## 2015-01-02 ENCOUNTER — Encounter (HOSPITAL_COMMUNITY): Payer: Medicare Other | Admitting: Physical Therapy

## 2015-01-07 ENCOUNTER — Encounter (HOSPITAL_COMMUNITY): Payer: Medicare Other

## 2015-01-07 ENCOUNTER — Encounter (HOSPITAL_COMMUNITY): Payer: Medicare Other | Admitting: Physical Therapy

## 2015-01-08 ENCOUNTER — Encounter (HOSPITAL_COMMUNITY): Payer: Medicare Other | Admitting: Physical Therapy

## 2015-01-09 ENCOUNTER — Encounter (HOSPITAL_COMMUNITY): Payer: Medicare Other | Admitting: Physical Therapy

## 2015-01-09 ENCOUNTER — Telehealth (HOSPITAL_COMMUNITY): Payer: Self-pay | Admitting: Physical Therapy

## 2015-01-09 ENCOUNTER — Ambulatory Visit (HOSPITAL_COMMUNITY): Payer: Medicare Other | Admitting: Physical Therapy

## 2015-01-09 NOTE — Telephone Encounter (Signed)
Called pt re missed appointment but phone was no longer in service.    Virgina Organ, PT CLT [phone]

## 2015-01-10 ENCOUNTER — Encounter (HOSPITAL_COMMUNITY): Payer: Medicare Other

## 2015-01-13 NOTE — Telephone Encounter (Signed)
Called RE:  Two missed appointments therefore will be discharged until pt receives a new order from the MD.  Phone was disconnected.  Virgina Organ, PT CLT [phone]

## 2015-01-14 ENCOUNTER — Telehealth (HOSPITAL_COMMUNITY): Payer: Self-pay

## 2015-01-14 ENCOUNTER — Encounter (HOSPITAL_COMMUNITY): Payer: Medicare Other

## 2015-01-14 NOTE — Telephone Encounter (Signed)
Spoke with patient after Vernona Rieger reviewed chart and instructed me to phone pt and  let them know we made a mistake and should not have D/c pt. We will be glad to continue to see pt and call the MD to get new ref# since we made the mistake., she w/call us back to make her next apptemnt. NF 01/14/15

## 2015-01-16 ENCOUNTER — Encounter (HOSPITAL_COMMUNITY): Payer: Medicare Other

## 2015-01-21 ENCOUNTER — Encounter (HOSPITAL_COMMUNITY): Payer: Medicare Other

## 2015-01-23 ENCOUNTER — Encounter (HOSPITAL_COMMUNITY): Payer: Medicare Other

## 2015-01-23 ENCOUNTER — Encounter (HOSPITAL_COMMUNITY): Payer: Medicare Other | Admitting: Physical Therapy

## 2015-01-28 ENCOUNTER — Encounter (HOSPITAL_COMMUNITY): Payer: Medicare Other

## 2015-01-28 DIAGNOSIS — Z79899 Other long term (current) drug therapy: Secondary | ICD-10-CM | POA: Diagnosis not present

## 2015-01-28 DIAGNOSIS — G35 Multiple sclerosis: Secondary | ICD-10-CM | POA: Diagnosis not present

## 2015-01-30 ENCOUNTER — Encounter (HOSPITAL_COMMUNITY): Payer: Medicare Other

## 2015-02-04 ENCOUNTER — Encounter (HOSPITAL_COMMUNITY): Payer: Medicare Other

## 2015-02-06 ENCOUNTER — Encounter (HOSPITAL_COMMUNITY): Payer: Medicare Other

## 2015-02-11 ENCOUNTER — Encounter (HOSPITAL_COMMUNITY): Payer: Medicare Other | Admitting: Physical Therapy

## 2015-02-17 DIAGNOSIS — G35 Multiple sclerosis: Secondary | ICD-10-CM | POA: Diagnosis not present

## 2015-02-17 DIAGNOSIS — G4733 Obstructive sleep apnea (adult) (pediatric): Secondary | ICD-10-CM | POA: Diagnosis not present

## 2015-02-17 DIAGNOSIS — R5383 Other fatigue: Secondary | ICD-10-CM | POA: Diagnosis not present

## 2015-02-17 DIAGNOSIS — R2689 Other abnormalities of gait and mobility: Secondary | ICD-10-CM | POA: Diagnosis not present

## 2015-02-26 ENCOUNTER — Encounter (HOSPITAL_COMMUNITY): Payer: Self-pay

## 2015-02-26 ENCOUNTER — Encounter (HOSPITAL_COMMUNITY)
Admission: RE | Admit: 2015-02-26 | Discharge: 2015-02-26 | Disposition: A | Discharge status: Home / Self Care | Payer: Medicare Other | Source: Ambulatory Visit | Attending: Neurology | Admitting: Neurology

## 2015-02-26 DIAGNOSIS — G35 Multiple sclerosis: Secondary | ICD-10-CM | POA: Insufficient documentation

## 2015-02-26 MED ORDER — SODIUM CHLORIDE 0.9 % IV SOLN
INTRAVENOUS | Status: DC
Start: 2015-02-26 — End: 2015-02-27
  Administered 2015-02-26: 14:00:00 via INTRAVENOUS

## 2015-02-26 MED ORDER — SODIUM CHLORIDE 0.9 % IV SOLN
1000.0000 mg | Freq: Every day | INTRAVENOUS | Status: DC
  Administered 2015-02-26: 1000 mg via INTRAVENOUS
  Filled 2015-02-26: qty 8

## 2015-02-27 ENCOUNTER — Encounter (HOSPITAL_COMMUNITY): Payer: Self-pay

## 2015-02-27 ENCOUNTER — Encounter (HOSPITAL_COMMUNITY)
Admission: RE | Admit: 2015-02-27 | Discharge: 2015-02-27 | Disposition: A | Discharge status: Home / Self Care | Payer: Medicare Other | Source: Ambulatory Visit | Attending: Neurology | Admitting: Neurology

## 2015-02-27 ENCOUNTER — Ambulatory Visit (HOSPITAL_COMMUNITY): Payer: Medicare Other | Attending: Neurology | Admitting: Physical Therapy

## 2015-02-27 DIAGNOSIS — R2681 Unsteadiness on feet: Secondary | ICD-10-CM | POA: Insufficient documentation

## 2015-02-27 DIAGNOSIS — Z789 Other specified health status: Secondary | ICD-10-CM | POA: Diagnosis not present

## 2015-02-27 DIAGNOSIS — Z7409 Other reduced mobility: Secondary | ICD-10-CM | POA: Insufficient documentation

## 2015-02-27 DIAGNOSIS — G35 Multiple sclerosis: Secondary | ICD-10-CM | POA: Insufficient documentation

## 2015-02-27 DIAGNOSIS — R531 Weakness: Secondary | ICD-10-CM | POA: Insufficient documentation

## 2015-02-27 MED ORDER — SODIUM CHLORIDE 0.9 % IV SOLN
Freq: Once | INTRAVENOUS | Status: AC
  Administered 2015-02-27: 250 mL via INTRAVENOUS

## 2015-02-27 MED ORDER — SODIUM CHLORIDE 0.9 % IV SOLN
1000.0000 mg | Freq: Every day | INTRAVENOUS | Status: DC
  Administered 2015-02-27: 1000 mg via INTRAVENOUS
  Filled 2015-02-27: qty 8

## 2015-02-27 NOTE — Therapy (Signed)
[facility] [address] [place], [place], [address] Phone: [phone]   Fax:  [phone]  Physical Therapy Evaluation  Patient Melissa West  Name: Melissa Melissa West MRN: [id number] Date of Birth: [birth year] Referring Provider: Gerilyn Melissa West  Encounter Date: 02/27/2015      PT End of Session - 02/27/15 1153    Visit Number 1   Number of Visits 16   Date for PT Re-Evaluation 03/29/15   Authorization Type Medicare/Medicaid   Authorization - Visit Number 1   Authorization - Number of Visits 16   PT Start Time 1035   PT Stop Time 1100   PT Time Calculation (min) 25 min   Equipment Utilized During Treatment Gait belt   Activity Tolerance Patient tolerated treatment well   Behavior During Therapy [facility] for tasks assessed/performed      Past Medical History  Diagnosis Date  . MS (multiple sclerosis) (HCC)   . No pertinent past medical history     Past Surgical History  Procedure Laterality Date  . Cesarean section  08/09/2011    Procedure: CESAREAN SECTION;  Surgeon: Lesly Dukes, MD;  Location: [facility];  Service: Gynecology;  Laterality: N/A;    There were no vitals filed for this visit.  Visit Diagnosis:  Impaired mobility and activities of daily living  Generalized weakness  Poor tolerance for activity  Unsteadiness on feet  Multiple sclerosis (HCC)      Subjective Assessment - 02/27/15 1032    Subjective Pt has diagnosis of MS she has periodic bouts of exacerbation where her walking becomes difficult.  He has been seen for physical therapy this year and discharged in September due to the fact that the patient quit coming.  She states she quit coming due to getting a cold.  Ms. Melissa West states that she would like to be to walk again she states that she is unable to walk due to her balance being completely off.      Pertinent History Pt has had MS for seven years, with a constant gradual decline. Pt denies any notable  exacerbations of Sx. Pt lives c 3yo son, and gets help c childcare from mother and aunt. Pt uses WC for most mobility in home, except for toiletting due to space restrictions.    How long can you sit comfortably? Uses a w/c for mobility.    How long can you stand comfortably? Able to stand with contact gaurd assist with walker support x 2'    How long can you walk comfortably? currently unable to do so for 1 year     Patient Stated Goals improve stability on feet for transfers and ambulation.    Currently in Pain? No/denies            [facility] PT Assessment - 02/27/15 0001    Assessment   Medical Diagnosis MS   Referring Provider Melissa Melissa West   Onset Date/Surgical Date 06/07/14   Hand Dominance Left   Prior Therapy years ago    Prior Function   Level of Independence Independent with basic ADLs;Needs assistance with homemaking  requires some assistance with taking care of 3yo son.    Observation/Other Assessments   Focus on Therapeutic Outcomes (FOTO)  37   Sensation   Light Touch Impaired Detail  Mild impairments and allodynia around lat/post R ankle   Posture/Postural Control   Posture/Postural Control Postural limitations   Postural Limitations --  While seated pt is limited in R leaninng > L leaning  Posture Comments Pt does not withstand moderate perturbations in A/P, Lat dirtections   Tone   Assessment Location Right Lower Extremity;Left Lower Extremity   AROM   Right Ankle Dorsiflexion 0   PROM   Overall PROM  Deficits   Left Ankle Dorsiflexion -5   Strength   Right/Left Hip Right;Left   Right Hip Flexion 1/5   Right Hip Extension 2-/5   Right Hip ABduction 2-/5   Left Hip Flexion 1/5   Left Hip Extension 2-/5   Left Hip ABduction 2-/5   Right Knee Flexion 2/5   Right Knee Extension 3/5   Left Knee Flexion 2/5   Left Knee Extension 3+/5   Right Ankle Dorsiflexion 3/5   Left Ankle Dorsiflexion 2/5   Flexibility   Hamstrings L 140*, R 135*   Bed Mobility   Supine  to Sit 4: Min assist   Sit to Supine 5: Supervision   Transfers   Transfers Sit to Stand;Stand Pivot Transfers   Sit to Stand 4: Min guard   Comments Pt appears very unsteady, poor safety awareness.   Ambulation/Gait   Ambulation Distance (Feet) 6 Feet   Assistive device Rolling walker   Gait Comments Assistance given c RW, pt lost control and flops onto mat   RLE Tone   RLE Tone Moderate;Severe   LLE Tone   LLE Tone Moderate;Severe   RLE Strength   Right Hip External Rotation  --   LLE Strength   Left Hip External Rotation  --                   [facility] Adult PT Treatment/Exercise - 02/27/15 0001    Exercises   Exercises Knee/Hip   Knee/Hip Exercises: Seated   Long Arc Quad Strengthening;Both;5 reps   Other Seated Knee/Hip Exercises heel raises 10x; Toe raises AAROM 10x   Other Seated Knee/Hip Exercises hip adduction x 10    Marching Both;10 reps                PT Education - 02/27/15 1152    Education provided Yes   Education Melissa West HEP   Person(s) Educated Patient   Methods Explanation;Demonstration;Handout   Comprehension Verbalized understanding;Returned demonstration          PT Short Term Goals - 02/27/15 1155    PT SHORT TERM GOAL #1   Title Patient will tolerate full stance c RW for 5 minutes, followed by controlled decent to chair.    Time 4   Period Weeks   Status New   PT SHORT TERM GOAL #2   Title Pt will demonstrate indep in all HEP activities.    Time 2   Period Weeks   Status New   PT SHORT TERM GOAL #3   Title Pt mm strength to be improved by 1/2 grade to be able to progress to gait activity   Time 4   Period Weeks   Status New   PT SHORT TERM GOAL #4   Title Pt to verbalize that rotation in LE will decrease her tone.   Time 4   Period Weeks   Status New           PT Long Term Goals - 02/27/15 1159    PT LONG TERM GOAL #1   Title Pt to be  able to ambulate for 58ft with supervision and RW   Time 8   Period  Weeks   Status New   PT LONG TERM GOAL #2  Title Pt will demonstrate full indep in all transfers and bed mobility in order to decrease caregiver burden.    Time 8   Period Weeks   Status New   PT LONG TERM GOAL #3   Title Pt will demonstrate improvement in strength 1 mm grade  to improve safety of ambulation    Time 8   Period Weeks               Plan - 03/08/15 1201    Clinical Impression Statement Pt is a 24 yo female with history of MS that has progressed to the point where she is no longer able to walk.  She has been referred to skilled physical therapy to assist in her gait traiining as well as for an orthotic for her ankle.  Pt was educated that she will need to go to an orthotist, pt familiar with biomed.  Pt demonstrates increased extensor tone in B LE , decreased strength, decreased balance and decreaased functional mobility.  Pt will benefit from skilled care to work on these issues and improve her functional mobiltiy.    Pt will benefit from skilled therapeutic intervention in order to improve on the following deficits Abnormal gait;Decreased endurance;Impaired sensation;Impaired tone;Decreased activity tolerance;Decreased balance;Decreased knowledge of use of DME;Decreased mobility;Decreased range of motion;Decreased safety awareness;Difficulty walking;Decreased strength;Increased muscle spasms;Impaired flexibility   Rehab Potential Good   PT Frequency 2x / week   PT Duration 8 weeks   PT Treatment/Interventions ADLs/Self Care Home Management;Gait training;Functional mobility training;Stair training;Therapeutic activities;Therapeutic exercise;Balance training;Patient/family education;Passive range of motion;Energy conservation   PT Next Visit Plan Begin gait training, sitting ball throughing to improve core strength, sit to stand.    PT Home Exercise Plan given           G-Codes - Mar 08, 2015 1213    Functional Assessment Tool Used FOTO as well as clinical judgement .     Functional Limitation Mobility: Walking and moving around   Mobility: Walking and Moving Around Current Status [phone]) At least 80 percent but less than 100 percent impaired, limited or restricted   Mobility: Walking and Moving Around Goal Status [phone]) At least 60 percent but less than 80 percent impaired, limited or restricted       Problem List Patient Active Problem List   Diagnosis Date Noted  . MS (multiple sclerosis) (HCC)   . Neurological deficit present 11/11/2010  Virgina Organ, PT CLT [phone] 03-08-15, 12:16 PM  [facility] [address] [place], [place], [address] Phone: [phone]   Fax:  [phone]  Name: Melissa Melissa West MRN: [id number] Date of Birth: 09/03/1990

## 2015-02-27 NOTE — Patient Instructions (Addendum)
Toe Raise (Sitting)    Raise toes, keeping heels on floor. Repeat _10___ times per set. Do __1__ sets per session. Do _3__ sessions per day.  http://orth.exer.us/46   Copyright  VHI. All rights reserved.  Heel Raise (Sitting)    Raise heels, keeping toes on floor. Repeat __10__ times per set. Do _1___ sets per session. Do 3__ sessions per day.  http://orth.exer.us/44   Copyright  VHI. All rights reserved.  Knee Extension (Sitting)    Place _0___ pound weight on left ankle and straighten knee fully, lower slowly. Repeat __10__ times per set. Do _1___ sets per session. Do _3___ sessions per day.  http://orth.exer.us/732   Copyright  VHI. All rights reserved.  Adduction With Resistance    Spread legs and place a pillow on inside of thighs. Squeeze legs together as hard as possible  for _5__ seconds. Repeat _10__ times. Do __3 sessions per day. Note: If possible, place feet on floor.  Copyright  VHI. All rights reserved.  Marching    Alternate lifting knees as high as is comfortable, as if marching. Repeat _10__ times each leg. Do _3__ sessions per day. Note: If possible place feet on floor.  Chttp://orth.exer.us/1096   Copyright  VHI. All rights reserved.  Hip Abduction / Adduction: with Extended Knee (Supine)    Bring left leg out to side and return. Keep knee straight. Repeat _10___ times per set. Do ___1_ sets per session. Do _2___ sessions per day.  http://orth.exer.us/680   Copyright  VHI. All rights reserved.  Isometric Gluteals    Tighten buttock muscles. Repeat __10__ times per set. Do __1__ sets per session. Do ___5_ sessions per day.  http://orth.exer.us/1126   Copyright  VHI. All rights reserved.

## 2015-02-28 ENCOUNTER — Encounter (HOSPITAL_COMMUNITY)
Admission: RE | Admit: 2015-02-28 | Discharge: 2015-02-28 | Disposition: A | Discharge status: Home / Self Care | Payer: Medicare Other | Source: Ambulatory Visit | Attending: Neurology | Admitting: Neurology

## 2015-02-28 ENCOUNTER — Encounter (HOSPITAL_COMMUNITY): Payer: Self-pay

## 2015-02-28 DIAGNOSIS — G35 Multiple sclerosis: Secondary | ICD-10-CM | POA: Diagnosis not present

## 2015-02-28 MED ORDER — SODIUM CHLORIDE 0.9 % IV SOLN
1000.0000 mg | Freq: Once | INTRAVENOUS | Status: AC
  Administered 2015-02-28: 1000 mg via INTRAVENOUS
  Filled 2015-02-28: qty 8

## 2015-02-28 MED ORDER — SODIUM CHLORIDE 0.9 % IV SOLN
INTRAVENOUS | Status: DC
Start: 2015-02-28 — End: 2015-03-01
  Administered 2015-02-28: 12:00:00 via INTRAVENOUS

## 2015-03-10 ENCOUNTER — Other Ambulatory Visit (HOSPITAL_COMMUNITY): Payer: Self-pay | Admitting: Respiratory Therapy

## 2015-03-10 DIAGNOSIS — R2689 Other abnormalities of gait and mobility: Secondary | ICD-10-CM

## 2015-03-10 DIAGNOSIS — G35 Multiple sclerosis: Secondary | ICD-10-CM

## 2015-03-10 DIAGNOSIS — R5383 Other fatigue: Secondary | ICD-10-CM

## 2015-03-10 DIAGNOSIS — G4733 Obstructive sleep apnea (adult) (pediatric): Secondary | ICD-10-CM

## 2015-03-11 ENCOUNTER — Ambulatory Visit (HOSPITAL_COMMUNITY): Payer: Medicare Other

## 2015-03-11 ENCOUNTER — Telehealth (HOSPITAL_COMMUNITY): Payer: Self-pay

## 2015-03-11 NOTE — Telephone Encounter (Signed)
She is not able to come in today due to taking care of her son.

## 2015-03-13 ENCOUNTER — Ambulatory Visit (HOSPITAL_COMMUNITY): Payer: Medicare Other

## 2015-03-18 ENCOUNTER — Ambulatory Visit (HOSPITAL_COMMUNITY): Payer: Medicare Other | Attending: Neurology

## 2015-03-18 DIAGNOSIS — Z789 Other specified health status: Secondary | ICD-10-CM | POA: Diagnosis not present

## 2015-03-18 DIAGNOSIS — R531 Weakness: Secondary | ICD-10-CM | POA: Insufficient documentation

## 2015-03-18 DIAGNOSIS — R2681 Unsteadiness on feet: Secondary | ICD-10-CM | POA: Insufficient documentation

## 2015-03-18 DIAGNOSIS — Z7409 Other reduced mobility: Secondary | ICD-10-CM | POA: Insufficient documentation

## 2015-03-18 DIAGNOSIS — G35 Multiple sclerosis: Secondary | ICD-10-CM | POA: Diagnosis not present

## 2015-03-18 NOTE — Therapy (Signed)
[place] [facility] [address] [place], [place], [address] Phone: [phone]   Fax:  [phone]  Physical Therapy Treatment  Patient Details  Name: Melissa West MRN: [id number] Date of Birth: 19-Dec-1990 Referring Melissa West: Melissa West  Encounter Date: 03/18/2015      PT End of Session - 03/18/15 1150    Visit Number 2   Number of Visits 16   Date for PT Re-Evaluation 03/29/15   Authorization Type Medicare/Medicaid   Authorization - Visit Number 2   Authorization - Number of Visits 10   PT Start Time 1103   PT Stop Time 1145   PT Time Calculation (min) 42 min   Equipment Utilized During Treatment Gait belt   Activity Tolerance Patient tolerated treatment well   Behavior During Therapy [facility] for tasks assessed/performed      Past Medical History  Diagnosis Date  . MS (multiple sclerosis) (HCC)   . No pertinent past medical history     Past Surgical History  Procedure Laterality Date  . Cesarean section  08/09/2011    Procedure: CESAREAN SECTION;  Surgeon: Lesly Dukes, MD;  Location: [facility];  Service: Gynecology;  Laterality: N/A;    There were no vitals filed for this visit.  Visit Diagnosis:  Impaired mobility and activities of daily living  Generalized weakness  Poor tolerance for activity  Unsteadiness on feet  Multiple sclerosis (HCC)      Subjective Assessment - 03/18/15 1349    Subjective Pain free today, reports compliance with HEP daily.  Reports she has not been walking in a very long time, feels safe wtih transfers at home.    Pertinent History Pt has had MS for seven years, with a constant gradual decline. Pt denies any notable exacerbations of Sx. Pt lives c 3yo son, and gets help c childcare from mother and aunt. Pt uses WC for most mobility in home, except for toiletting due to space restrictions.    Currently in Pain? No/denies                         [facility] Adult PT  Treatment/Exercise - 03/18/15 0001    Bed Mobility   Sit to Supine 4: Min assist   Sit to Supine - Details (indicate cue type and reason) cueing for hand placement   Transfers   Transfers Sit to Stand;Stand Pivot Transfers   Sit to Stand 4: Min guard   Stand Pivot Transfers 5: Supervision   Number of Reps 10 reps   Transfer Cueing handplacement   Comments Pt appears very unsteady, poor safety awareness.   Ambulation/Gait   Ambulation Distance (Feet) --  60ft, 32 feet   Assistive device Rolling walker   Gait Comments Min assistance required for with RW, PTT pushed WC behind for safety   Knee/Hip Exercises: Standing   Gait Training Gait trainigng 2x 30 feet with RW 2assist for safety   Other Standing Knee Exercises 10 STS   Knee/Hip Exercises: Seated   Other Seated Knee/Hip Exercises sitting on edge of mat, 15 reps of UE flexion and abduction to improve core strengthening without HHA   Knee/Hip Exercises: Supine   Hip Adduction Isometric AAROM;10 reps                  PT Short Term Goals - 02/27/15 1155    PT SHORT TERM GOAL #1   Title Patient will tolerate full stance c RW for 5 minutes,  followed by controlled decent to chair.    Time 4   Period Weeks   Status New   PT SHORT TERM GOAL #2   Title Pt will demonstrate indep in all HEP activities.    Time 2   Period Weeks   Status New   PT SHORT TERM GOAL #3   Title Pt mm strength to be improved by 1/2 grade to be able to progress to gait activity   Time 4   Period Weeks   Status New   PT SHORT TERM GOAL #4   Title Pt to verbalize that rotation in LE will decrease her tone.   Time 4   Period Weeks   Status New           PT Long Term Goals - 02/27/15 1159    PT LONG TERM GOAL #1   Title Pt to be  able to ambulate for 33ft with supervision and RW   Time 8   Period Weeks   Status New   PT LONG TERM GOAL #2   Title Pt will demonstrate full indep in all transfers and bed mobility in order to decrease  caregiver burden.    Time 8   Period Weeks   Status New   PT LONG TERM GOAL #3   Title Pt will demonstrate improvement in strength 1 mm grade  to improve safety of ambulation    Time 8   Period Weeks               Plan - 03/18/15 1355    Clinical Impression Statement Reviewed goals, compliance with HEP and copy of evaluation given to pt.  Pt given referal and contact information for [employer] for AFO, KAFO orthotics to assist with gait.  Pt able to demonstrate ability to transfer WC to mat independently though did required assistance for safety.  Pt educated on safe techniques to reduce risk of falls including locking brakes and appropriate hand placement for sit to stands.  Gait training complete with RW and PTT behind with WC for safety.  Core strengthening exercises complete at end of session with sitting tall without HHA and min guard during exercise to reduce risk of falling off mat.  No reports of pain through session, was limited by visible musculature fatigue.   PT Next Visit Plan Continue gait training, sitting ball throughing to improve core strength, sit to stand.         Problem List Patient Active Problem List   Diagnosis Date Noted  . MS (multiple sclerosis) (HCC)   . Neurological deficit present 11/11/2010   Melissa West, LPTA; CBIS [phone]  Melissa West 03/18/2015, 2:05 PM  [place] [facility] [address] [place], [place], [address] Phone: [phone]   Fax:  [phone]  Name: Melissa West MRN: [id number] Date of Birth: [birth year]

## 2015-03-20 ENCOUNTER — Ambulatory Visit (HOSPITAL_COMMUNITY): Payer: Medicare Other

## 2015-03-20 DIAGNOSIS — G35 Multiple sclerosis: Secondary | ICD-10-CM

## 2015-03-20 DIAGNOSIS — R531 Weakness: Secondary | ICD-10-CM | POA: Diagnosis not present

## 2015-03-20 DIAGNOSIS — Z789 Other specified health status: Secondary | ICD-10-CM

## 2015-03-20 DIAGNOSIS — R2681 Unsteadiness on feet: Secondary | ICD-10-CM

## 2015-03-20 DIAGNOSIS — Z7409 Other reduced mobility: Secondary | ICD-10-CM

## 2015-03-20 NOTE — Therapy (Signed)
[place] [facility] [address] [place], [place], [address] Phone: [phone]   Fax:  [phone]  Physical Therapy Treatment  Patient Details  Name: Keera Altidor MRN: [id number] Date of Birth: March 16, 1991 Referring Provider: Gerilyn Pilgrim  Encounter Date: 03/20/2015      PT End of Session - 03/20/15 1417    Visit Number 3   Number of Visits 16   Date for PT Re-Evaluation 03/29/15   Authorization Type Medicare/Medicaid   Authorization Time Period 02/27/2015-04/29/2015   Authorization - Visit Number 3   Authorization - Number of Visits 10   PT Start Time 1300   PT Stop Time 1346   PT Time Calculation (min) 46 min   Equipment Utilized During Treatment Gait belt   Activity Tolerance Patient tolerated treatment well   Behavior During Therapy [facility] for tasks assessed/performed      Past Medical History  Diagnosis Date  . MS (multiple sclerosis) (HCC)   . No pertinent past medical history     Past Surgical History  Procedure Laterality Date  . Cesarean section  08/09/2011    Procedure: CESAREAN SECTION;  Surgeon: Lesly Dukes, MD;  Location: [facility];  Service: Gynecology;  Laterality: N/A;    There were no vitals filed for this visit.  Visit Diagnosis:  Impaired mobility and activities of daily living  Generalized weakness  Poor tolerance for activity  Unsteadiness on feet  Multiple sclerosis (HCC)      Subjective Assessment - 03/20/15 1259    Subjective Pain free today, been compliant with HEP.   Currently in Pain? No/denies            [facility] Adult PT Treatment/Exercise - 03/20/15 0001    Transfers   Transfers Sit to Stand;Stand Pivot Transfers   Sit to Stand 4: Min guard   Stand Pivot Transfers 5: Supervision   Number of Reps 10 reps   Transfer Cueing handplacement   Comments Pt appears very unsteady, poor safety awareness.   Ambulation/Gait   Ambulation Distance (Feet) 30 Feet  30 ft with RW; 4 RT forward, 2  RT retro 2 RT sidestep // bar   Assistive device Rolling walker;Parallel bars   Gait Comments Min assistance required for with RW, PTT pushed WC behind for safety   Knee/Hip Exercises: Seated   Long Arc Quad Strengthening;Both;5 reps   Heel Slides Both;AAROM;10 reps   Other Seated Knee/Hip Exercises sitting on dynadisc, 15 reps of UE flexion and abduction to improve core strengthening without HHA,  Cone rotation both sides   Other Seated Knee/Hip Exercises hip adduction x 10             PT Short Term Goals - 02/27/15 1155    PT SHORT TERM GOAL #1   Title Patient will tolerate full stance c RW for 5 minutes, followed by controlled decent to chair.    Time 4   Period Weeks   Status New   PT SHORT TERM GOAL #2   Title Pt will demonstrate indep in all HEP activities.    Time 2   Period Weeks   Status New   PT SHORT TERM GOAL #3   Title Pt mm strength to be improved by 1/2 grade to be able to progress to gait activity   Time 4   Period Weeks   Status New   PT SHORT TERM GOAL #4   Title Pt to verbalize that rotation in LE will decrease her tone.  Time 4   Period Weeks   Status New           PT Long Term Goals - 02/27/15 1159    PT LONG TERM GOAL #1   Title Pt to be  able to ambulate for 28ft with supervision and RW   Time 8   Period Weeks   Status New   PT LONG TERM GOAL #2   Title Pt will demonstrate full indep in all transfers and bed mobility in order to decrease caregiver burden.    Time 8   Period Weeks   Status New   PT LONG TERM GOAL #3   Title Pt will demonstrate improvement in strength 1 mm grade  to improve safety of ambulation    Time 8   Period Weeks               Plan - 03/20/15 1423    Clinical Impression Statement Session focus on improving transfer training safely, gait training and core strengthening to improve balance.  Pt stated she has called [employer] [facility] and has appointment for KAFO fitting next session.  Pt required cueing for  safety with transfers from [facility] to mat and with standing including checking breaks and arm placement.  Core strengthening exercises in standing to improve posture with posterior pelvic tilts and seated reaching activities while sitting on dynadisc to improve seated balance with SBA   PT Next Visit Plan Continue gait training, sitting ball throughing to improve core strength, sit to stand.         Problem List Patient Active Problem List   Diagnosis Date Noted  . MS (multiple sclerosis) (HCC)   . Neurological deficit present 11/11/2010   Becky Sax, LPTA; CBIS [phone]  Juel Burrow 03/20/2015, 5:13 PM  [place] [facility] [address] [place], [place], [address] Phone: [phone]   Fax:  [phone]  Name: Nature Kueker MRN: [id number] Date of Birth: July 02, 1990

## 2015-03-25 ENCOUNTER — Ambulatory Visit (HOSPITAL_COMMUNITY): Payer: Medicare Other | Admitting: Physical Therapy

## 2015-03-25 DIAGNOSIS — Z7409 Other reduced mobility: Secondary | ICD-10-CM

## 2015-03-25 DIAGNOSIS — R531 Weakness: Secondary | ICD-10-CM | POA: Diagnosis not present

## 2015-03-25 DIAGNOSIS — Z789 Other specified health status: Secondary | ICD-10-CM

## 2015-03-25 DIAGNOSIS — R2681 Unsteadiness on feet: Secondary | ICD-10-CM

## 2015-03-25 DIAGNOSIS — G35 Multiple sclerosis: Secondary | ICD-10-CM

## 2015-03-25 NOTE — Therapy (Signed)
[facility] [address] [place], [place], [address] Phone: [phone]   Fax:  [phone]  Physical Therapy Treatment  Patient Details  Name: Melissa West MRN: [id number] Date of Birth: Oct 21, 1990 Referring Provider: Gerilyn Pilgrim  Encounter Date: 03/25/2015      PT End of Session - 03/25/15 1150    Visit Number 4   Number of Visits 16   Date for PT Re-Evaluation 03/29/15   Authorization Type Medicare/Medicaid   Authorization Time Period 02/27/2015-04/29/2015   Authorization - Visit Number 4   Authorization - Number of Visits 10   PT Start Time 1102   PT Stop Time 1147   PT Time Calculation (min) 45 min   Equipment Utilized During Treatment Gait belt   Activity Tolerance Patient tolerated treatment well      Past Medical History  Diagnosis Date  . MS (multiple sclerosis) (HCC)   . No pertinent past medical history     Past Surgical History  Procedure Laterality Date  . Cesarean section  08/09/2011    Procedure: CESAREAN SECTION;  Surgeon: Lesly Dukes, MD;  Location: [facility];  Service: Gynecology;  Laterality: N/A;    There were no vitals filed for this visit.  Visit Diagnosis:  Impaired mobility and activities of daily living  Generalized weakness  Poor tolerance for activity  Unsteadiness on feet  Multiple sclerosis (HCC)      Subjective Assessment - 03/25/15 1131    Subjective Pt is painfree states she is doing her exercises once a day.    Currently in Pain? No/denies             [facility] Adult PT Treatment/Exercise - 03/25/15 0001    Transfers   Transfers Sit to Stand;Stand Pivot Transfers   Knee/Hip Exercises: Seated   Other Seated Knee/Hip Exercises sitting catching ball in various positions for core strengthening    Other Seated Knee/Hip Exercises go from sitting to supine to sitting for core strength (getting ball behind pt on mat)    Knee/Hip Exercises: Prone   Hip Extension --  quadriped  mad cat, hip extension, crawling, tall kneeling    Prone Knee Hang Limitations --  quadriped SAR x 10     standing with walker trying to obtain an erect position.               PT Short Term Goals - 03/25/15 1156    PT SHORT TERM GOAL #1   Title Patient will tolerate full stance c RW for 5 minutes, followed by controlled decent to chair.    Baseline 45 seconds    Time 4   Status On-going   PT SHORT TERM GOAL #2   Title Pt will demonstrate indep in all HEP activities.    Time 2   Period Weeks   Status On-going   PT SHORT TERM GOAL #3   Title Pt mm strength to be improved by 1/2 grade to be able to progress to gait activity   Time 4   Period Weeks   Status On-going   PT SHORT TERM GOAL #4   Title Pt to verbalize that rotation in LE will decrease her tone.   Time 4   Period Weeks   Status On-going           PT Long Term Goals - 03/25/15 1156    PT LONG TERM GOAL #1   Title Pt to be  able to ambulate for 60ft with supervision and  RW   Time 8   Period Weeks   Status On-going   PT LONG TERM GOAL #2   Title Pt will demonstrate full indep in all transfers and bed mobility in order to decrease caregiver burden.    Time 8   Status On-going   PT LONG TERM GOAL #3   Title Pt will demonstrate improvement in strength 1 mm grade  to improve safety of ambulation    Time 8   Period Weeks   Status On-going               Plan - 03/25/15 1150    Clinical Impression Statement Pt is unable to come to an erect position when standing therefore ambulation is not possible at this time.  Worked on core strengthening using alll 4, crawling and tall kneeling positions.  Pt unable to obtain erect position in tall kneeling.    PT Next Visit Plan continuing mat exercises to build core stabilization as a precurser to gait.         Problem List Patient Active Problem List   Diagnosis Date Noted  . MS (multiple sclerosis) (HCC)   . Neurological deficit present 11/11/2010   Virgina Organ, PT CLT [phone] 03/25/2015, 11:58 AM  [facility] [address] [place], [place], [address] Phone: [phone]   Fax:  [phone]  Name: Kailynne Hogwood MRN: [id number] Date of Birth: Aug 15, 1990

## 2015-03-26 ENCOUNTER — Encounter (HOSPITAL_COMMUNITY): Payer: Medicare Other | Admitting: Physical Therapy

## 2015-03-27 ENCOUNTER — Encounter (HOSPITAL_COMMUNITY): Payer: Medicare Other

## 2015-04-01 ENCOUNTER — Ambulatory Visit (HOSPITAL_COMMUNITY): Payer: Medicare Other | Admitting: Physical Therapy

## 2015-04-01 DIAGNOSIS — R531 Weakness: Secondary | ICD-10-CM | POA: Diagnosis not present

## 2015-04-01 DIAGNOSIS — R2681 Unsteadiness on feet: Secondary | ICD-10-CM

## 2015-04-01 DIAGNOSIS — Z789 Other specified health status: Secondary | ICD-10-CM

## 2015-04-01 DIAGNOSIS — G35 Multiple sclerosis: Secondary | ICD-10-CM | POA: Diagnosis not present

## 2015-04-01 DIAGNOSIS — Z7409 Other reduced mobility: Secondary | ICD-10-CM | POA: Diagnosis not present

## 2015-04-01 NOTE — Therapy (Signed)
[facility] [address] [place], [place], [address] Phone: [phone]   Fax:  [phone]  Physical Therapy Treatment (Re-Assessment)  Patient Details  Name: Melissa West MRN: [id number] Date of Birth: 02-15-1991 Referring Provider: Merlene Laughter  Encounter Date: 04/01/2015      PT End of Session - 04/01/15 1840    Visit Number 5   Number of Visits 16   Date for PT Re-Evaluation 04/29/15   Authorization Type Medicare/Medicaid   Authorization Time Period 02/27/2015-04/29/2015   Authorization - Visit Number 5   Authorization - Number of Visits 15   PT Start Time 1829   PT Stop Time 1149   PT Time Calculation (min) 44 min   Equipment Utilized During Treatment Gait belt   Activity Tolerance Patient tolerated treatment well   Behavior During Therapy [facility] for tasks assessed/performed      Past Medical History  Diagnosis Date  . MS (multiple sclerosis) ([place])   . No pertinent past medical history     Past Surgical History  Procedure Laterality Date  . Cesarean section  08/09/2011    Procedure: CESAREAN SECTION;  Surgeon: Guss Bunde, MD;  Location: [facility];  Service: Gynecology;  Laterality: N/A;    There were no vitals filed for this visit.  Visit Diagnosis:  Impaired mobility and activities of daily living  Generalized weakness  Poor tolerance for activity  Unsteadiness on feet  Multiple sclerosis (HCC)      Subjective Assessment - 04/01/15 1107    Subjective Patient painfree but reports that she is feeling better, did have a recent fall but was not injured and it happened when she was trying to get into bathroom    Pertinent History Pt has had MS for seven years, with a constant gradual decline. Pt denies any notable exacerbations of Sx. Pt lives c 58yo son, and gets help c childcare from mother and aunt. Pt uses WC for most mobility in home, except for toiletting due to space restrictions.    Currently in Pain?  No/denies            [facility] PT Assessment - 04/01/15 0001    PROM   Right Ankle Dorsiflexion 14   Left Ankle Dorsiflexion -4   Strength   Right Hip Extension 2-/5   Right Hip ABduction 2-/5   Left Hip Extension 2-/5   Right Knee Flexion 2/5   Right Knee Extension 4-/5   Left Knee Flexion 2/5   Left Knee Extension 4-/5   Right Ankle Dorsiflexion 2/5   Left Ankle Dorsiflexion 2+/5   Bed Mobility   Sit to Supine 4: Min guard   Sit to Supine - Details (indicate cue type and reason) extedned time, modified technique    Transfers   Transfers Sit to [employer] Transfers   Sit to Stand 4: Min guard   Stand Pivot Transfers 4: Min guard   Ambulation/Gait   Ambulation Distance (Feet) 30 Feet   Assistive device Rolling walker   Gait Comments Min guard for safety, very unsteady and wheelchair follow    RLE Tone   RLE Tone Moderate;Severe   LLE Tone   LLE Tone Moderate;Severe                             PT Education - 04/01/15 1839    Education provided Yes   Education Details progress with skilled PT services, use of passive rotation  and long duration stretches to assist in reducing tone/spasticity in legs    Person(s) Educated Patient   Methods Explanation   Comprehension Verbalized understanding          PT Short Term Goals - 04/01/15 1132    PT SHORT TERM GOAL #1   Title Patient will tolerate full stance c RW for 5 minutes, followed by controlled decent to chair.    Baseline 12/20- still working on time but able to control descent better    Time 4   Period Weeks   Status Partially Met   PT SHORT TERM GOAL #2   Title Pt will demonstrate indep in all HEP activities.    Baseline 12/20- doing HEP every other day    Time 2   Period Weeks   Status On-going   PT SHORT TERM GOAL #3   Title Pt mm strength to be improved by 1/2 grade to be able to progress to gait activity   Time 4   Period Weeks   Status On-going   PT SHORT TERM GOAL #4    Title Pt to verbalize that rotation in LE will decrease her tone.   Baseline 12/20- taught self-techniques for passitve rotation for tone in long sitting as well as long duration stretchse for reducing tone    Time 4   Period Weeks   Status On-going           PT Long Term Goals - 04/01/15 1139    PT LONG TERM GOAL #1   Title Pt to be  able to ambulate for 28f with supervision and RW   Baseline 12/20- about 31fwith walker, min guard    Time 8   Period Weeks   Status On-going   PT LONG TERM GOAL #2   Title Pt will demonstrate full indep in all transfers and bed mobility in order to decrease caregiver burden.    Baseline 12/20- able to perform bed mobility today with modified techniquea nd extended time, Mod(I); still requires Min guard for transfers    Time 8   Period Weeks   Status On-going   PT LONG TERM GOAL #3   Title Pt will demonstrate improvement in strength 1 mm grade  to improve safety of ambulation    Time 8   Period Weeks   Status On-going   PT LONG TERM GOAL #4   Title Patient to report no falls within the past 3 weeks in order to demonstrate improved balance and reduced risk of injury with mobility    Time 8   Period Weeks   Status New               Plan - 04/01/15 1841    Clinical Impression Statement Re-assessment performed today. Patient continues to demonstrate difficutly with functional transfers, bed mobility, and ambulation, with spasticity and tone in LEs as well as ankle weakness with limited dorsiflexion appearing to be major limiting factors. Core strength also appears lacking. Noted significant unsteadiness with transfers and ambulation today, mild difficulty navigating rolling walkier. Noted moderate-severe tone in bot hamstrings and very much in quads today and educated patient on passive LE rotation in long sitting as well as long duration stretches to assist in reducing tone. Based on weakness and limited ROM in bilateral ankle dorsiflexion,  recommend AFOs that will provide moderate level assistance in toe clearance. Gave patient MD order for AFOs with information on how/where to be fitted. At this time recommend ongoing skilled PT  services due to extent of unsteadiness with mobilty in general as well as general difficulty with mbility in general.    Pt will benefit from skilled therapeutic intervention in order to improve on the following deficits Abnormal gait;Decreased endurance;Impaired sensation;Impaired tone;Decreased activity tolerance;Decreased balance;Decreased knowledge of use of DME;Decreased mobility;Decreased range of motion;Decreased safety awareness;Difficulty walking;Decreased strength;Increased muscle spasms;Impaired flexibility   Rehab Potential Good   PT Frequency 2x / week   PT Duration 4 weeks   PT Treatment/Interventions ADLs/Self Care Home Management;Gait training;Functional mobility training;Stair training;Therapeutic activities;Therapeutic exercise;Balance training;Patient/family education;Passive range of motion;Energy conservation   PT Next Visit Plan continuing mat exercises to build core stabilization as a precurser to gait. Continue to practice self care techniques to reduce tone.    PT Home Exercise Plan given    Consulted and Agree with Plan of Care Patient          G-Codes - 04-07-15 1846    Functional Assessment Tool Used Based on skilled clinical assessment of mobility, balance, strength, tone, ambulation, fall risk    Functional Limitation Mobility: Walking and moving around   Mobility: Walking and Moving Around Current Status [phone]) At least 80 percent but less than 100 percent impaired, limited or restricted   Mobility: Walking and Moving Around Goal Status [phone]) At least 60 percent but less than 80 percent impaired, limited or restricted      Problem List Patient Active Problem List   Diagnosis Date Noted  . MS (multiple sclerosis) ([place])   . Neurological deficit present 11/11/2010    Physical Therapy Progress Note  Dates of Reporting Period: 02/27/15 to 04/07/15  Objective Reports of Subjective Statement: see above   Objective Measurements: see above   Goal Update: see above   Plan: see above   Reason Skilled Services are Required: very high fall risk, significant impairments in transfers, functional mobility, gait, tone, safety, strength, coordination   Deniece Ree PT, DPT [place], [place], [address] Phone: [phone]   Fax:  [phone]  Name: Melissa West MRN: [id number] Date of Birth: May 25, 1990

## 2015-04-03 ENCOUNTER — Telehealth (HOSPITAL_COMMUNITY): Payer: Self-pay

## 2015-04-03 ENCOUNTER — Ambulatory Visit (HOSPITAL_COMMUNITY): Payer: Medicare Other

## 2015-04-03 NOTE — Telephone Encounter (Signed)
She doesn't feel like coming in today

## 2015-04-08 ENCOUNTER — Ambulatory Visit (HOSPITAL_COMMUNITY): Payer: Medicare Other | Admitting: Physical Therapy

## 2015-04-08 DIAGNOSIS — Z7409 Other reduced mobility: Secondary | ICD-10-CM

## 2015-04-08 DIAGNOSIS — G35 Multiple sclerosis: Secondary | ICD-10-CM | POA: Diagnosis not present

## 2015-04-08 DIAGNOSIS — R2681 Unsteadiness on feet: Secondary | ICD-10-CM | POA: Diagnosis not present

## 2015-04-08 DIAGNOSIS — R531 Weakness: Secondary | ICD-10-CM

## 2015-04-08 DIAGNOSIS — Z789 Other specified health status: Secondary | ICD-10-CM

## 2015-04-08 NOTE — Therapy (Signed)
[facility] [address] [place], [place], [address] Phone: [phone]   Fax:  [phone]  Physical Therapy Treatment  Patient Details  Name: Melissa West MRN: [id number] Date of Birth: 02-05-1991 Referring Provider: Merlene Laughter  Encounter Date: 04/08/2015      PT End of Session - 04/08/15 1200    Visit Number 6   Number of Visits 16   Date for PT Re-Evaluation 04/29/15   Authorization Type Medicare/Medicaid   Authorization Time Period 02/27/2015-04/29/2015   Authorization - Visit Number 6   Authorization - Number of Visits 15   PT Start Time 1109   PT Stop Time 1150   PT Time Calculation (min) 41 min   Equipment Utilized During Treatment Gait belt   Activity Tolerance Patient tolerated treatment well      Past Medical History  Diagnosis Date  . MS (multiple sclerosis) ([place])   . No pertinent past medical history     Past Surgical History  Procedure Laterality Date  . Cesarean section  08/09/2011    Procedure: CESAREAN SECTION;  Surgeon: Guss Bunde, MD;  Location: [facility];  Service: Gynecology;  Laterality: N/A;    There were no vitals filed for this visit.  Visit Diagnosis:  Impaired mobility and activities of daily living  Generalized weakness  Poor tolerance for activity  Unsteadiness on feet  Multiple sclerosis (HCC)      Subjective Assessment - 04/08/15 1125    Subjective PT is only having minimal pain in her low back;  Pt has taken a few steps in her bathroom holding onto the walls    Currently in Pain? Yes   Pain Score 3    Pain Location Back   Pain Orientation Lower                         [facility] Adult PT Treatment/Exercise - 04/08/15 0001    Exercises   Exercises Lumbar   Lumbar Exercises: Stretches   Pelvic Tilt 5 reps  x2   Lumbar Exercises: Seated   Sit to Stand 5 reps   Other Seated Lumbar Exercises standing in upright position x 10    Lumbar Exercises: Supine   Heel Slides 10 reps   Heel Slides Limitations AA   Other Supine Lumbar Exercises Pilates 100; pressps with 3# x 10    Other Supine Lumbar Exercises Hip abduction Both AA x 10 reps    Lumbar Exercises: Prone   Other Prone Lumbar Exercises crawling, tall kneeling x 10    Other Prone Lumbar Exercises lift head and upper body until POE (lift with back mm not arms)                 PT Education - 04/08/15 1158    Education provided Yes   Education Details new exercises    Person(s) Educated Patient   Methods Explanation;Handout   Comprehension Verbalized understanding;Returned demonstration          PT Short Term Goals - 04/01/15 1132    PT SHORT TERM GOAL #1   Title Patient will tolerate full stance c RW for 5 minutes, followed by controlled decent to chair.    Baseline 12/20- still working on time but able to control descent better    Time 4   Period Weeks   Status Partially Met   PT SHORT TERM GOAL #2   Title Pt will demonstrate indep in all HEP activities.  Baseline 12/20- doing HEP every other day    Time 2   Period Weeks   Status On-going   PT SHORT TERM GOAL #3   Title Pt mm strength to be improved by 1/2 grade to be able to progress to gait activity   Time 4   Period Weeks   Status On-going   PT SHORT TERM GOAL #4   Title Pt to verbalize that rotation in LE will decrease her tone.   Baseline 12/20- taught self-techniques for passitve rotation for tone in long sitting as well as long duration stretchse for reducing tone    Time 4   Period Weeks   Status On-going           PT Long Term Goals - 04/01/15 1139    PT LONG TERM GOAL #1   Title Pt to be  able to ambulate for 6f with supervision and RW   Baseline 12/20- about 356fwith walker, min guard    Time 8   Period Weeks   Status On-going   PT LONG TERM GOAL #2   Title Pt will demonstrate full indep in all transfers and bed mobility in order to decrease caregiver burden.    Baseline 12/20- able  to perform bed mobility today with modified techniquea nd extended time, Mod(I); still requires Min guard for transfers    Time 8   Period Weeks   Status On-going   PT LONG TERM GOAL #3   Title Pt will demonstrate improvement in strength 1 mm grade  to improve safety of ambulation    Time 8   Period Weeks   Status On-going   PT LONG TERM GOAL #4   Title Patient to report no falls within the past 3 weeks in order to demonstrate improved balance and reduced risk of injury with mobility    Time 8   Period Weeks   Status New               Plan - 04/08/15 1201    Clinical Impression Statement PT explained to pt that her core and LE mm must get stronger prior to trying to walk.  Pt  is frustrated that therapist is not walking pt but pt does not realize how weak she is and that she would be an extreme fall risk if we did not work on balance and strength first.  Added pelvic tilt forf ab strengthening as well as back pain .  Added pilates 100 for core strength.    PT Next Visit Plan continuing mat exercises to build core stabilization as a precurser to gait. Continue to practice self care techniques to reduce tone.         Problem List Patient Active Problem List   Diagnosis Date Noted  . [place]  . Neurological deficit present 11/11/2010    CyRayetta HumphreyPT CLT [phone]/27/2016, 12:04 PM  [id number]: [phone] Fax:  [phone]: VeKeily LeppRN: [id number] of Birth: 7/Jul 18, 1990

## 2015-04-08 NOTE — Patient Instructions (Signed)
Pelvic Tilt    Flatten back by tightening stomach muscles and buttocks. Repeat _10___ times per set. Do _1___ sets per session. Do _2___ sessions per day.  http://orth.exer.us/134   Copyright  VHI. All rights reserved.  On Elbows (Prone)   Lift head and chest up until you are able to rest on your forearms  Rise up on elbows as high as possible, keeping hips on floor. Hold __2_ seconds. Repeat _10___ times per set. Do _1___ sets per session. Do __2__ sessions per day.  http://orth.exer.us/92   Copyright  VHI. All rights reserved.  The [place]    Lie on back, legs bent, arms toward ceiling. Exhale, pressing arms down to sides, curling up head and upper torso. Hold. Pump arms in small flutters up and down. ___5_ pumps per inhale, _5___ pumps per exhale. Repeat _20___ times. Do __1__ sessions per day.  http://pm.exer.us/0   Copyright  VHI. All rights reserved.

## 2015-04-10 ENCOUNTER — Ambulatory Visit (HOSPITAL_COMMUNITY): Payer: Medicare Other | Admitting: Physical Therapy

## 2015-04-10 DIAGNOSIS — R531 Weakness: Secondary | ICD-10-CM | POA: Diagnosis not present

## 2015-04-10 DIAGNOSIS — R2681 Unsteadiness on feet: Secondary | ICD-10-CM

## 2015-04-10 DIAGNOSIS — Z7409 Other reduced mobility: Secondary | ICD-10-CM | POA: Diagnosis not present

## 2015-04-10 DIAGNOSIS — Z789 Other specified health status: Secondary | ICD-10-CM

## 2015-04-10 DIAGNOSIS — G35 Multiple sclerosis: Secondary | ICD-10-CM | POA: Diagnosis not present

## 2015-04-10 NOTE — Therapy (Signed)
[facility] [address] [place], [place], [address] Phone: [phone]   Fax:  [phone]  Physical Therapy Treatment  Patient Details  Name: Melissa West MRN: [id number] Date of Birth: 29-Sep-1990 Referring Provider: Merlene Laughter  Encounter Date: 04/10/2015      PT End of Session - 04/10/15 1343    Visit Number 7   Number of Visits 16   Date for PT Re-Evaluation 04/29/15   Authorization Type Medicare/Medicaid   Authorization Time Period 02/27/2015-04/29/2015   Authorization - Visit Number 7   Authorization - Number of Visits 15   PT Start Time 6644   PT Stop Time 1144   PT Time Calculation (min) 41 min   Activity Tolerance Patient tolerated treatment well   Behavior During Therapy [facility] for tasks assessed/performed      Past Medical History  Diagnosis Date  . MS (multiple sclerosis) ([place])   . No pertinent past medical history     Past Surgical History  Procedure Laterality Date  . Cesarean section  08/09/2011    Procedure: CESAREAN SECTION;  Surgeon: Guss Bunde, MD;  Location: [facility];  Service: Gynecology;  Laterality: N/A;    There were no vitals filed for this visit.  Visit Diagnosis:  Impaired mobility and activities of daily living  Generalized weakness  Poor tolerance for activity  Unsteadiness on feet  Multiple sclerosis (HCC)      Subjective Assessment - 04/10/15 1325    Subjective Patient doing well today, reports no pain and very pleasant overall    Pertinent History Pt has had MS for seven years, with a constant gradual decline. Pt denies any notable exacerbations of Sx. Pt lives c 89yo son, and gets help c childcare from mother and aunt. Pt uses WC for most mobility in home, except for toiletting due to space restrictions.    Currently in Pain? No/denies                         [facility] Adult PT Treatment/Exercise - 04/10/15 0001    Transfers   Transfers Sit to Stand;Stand Pivot  Transfers   Sit to Stand 4: Min guard   Stand Pivot Transfers 4: Min guard   Lumbar Exercises: Standing   Other Standing Lumbar Exercises sit to stand with slow lower for muscle control 1x10   Lumbar Exercises: Seated   Other Seated Lumbar Exercises seated at edge of mat table: lateral trunk flexion/mini-crunch superset, cone rotation, cross-midline reaching all with feet elevated from floor    Knee/Hip Exercises: Standing   Heel Raises Both;1 set;10 reps   Other Standing Knee Exercises lateral and forward/backward weight shifts (Staggered stance) in parallel bars    Other Standing Knee Exercises mini-squats 1x5 before tone prevented proper form                 PT Education - 04/10/15 1342    Education provided Yes   Education Details encouraged to contact insurance about possibly getting new WC; also encouraged to pursue AFOs    Person(s) Educated Patient   Methods Explanation   Comprehension Verbalized understanding          PT Short Term Goals - 04/01/15 1132    PT SHORT TERM GOAL #1   Title Patient will tolerate full stance c RW for 5 minutes, followed by controlled decent to chair.    Baseline 12/20- still working on time but able to control descent better  Time 4   Period Weeks   Status Partially Met   PT SHORT TERM GOAL #2   Title Pt will demonstrate indep in all HEP activities.    Baseline 12/20- doing HEP every other day    Time 2   Period Weeks   Status On-going   PT SHORT TERM GOAL #3   Title Pt mm strength to be improved by 1/2 grade to be able to progress to gait activity   Time 4   Period Weeks   Status On-going   PT SHORT TERM GOAL #4   Title Pt to verbalize that rotation in LE will decrease her tone.   Baseline 12/20- taught self-techniques for passitve rotation for tone in long sitting as well as long duration stretchse for reducing tone    Time 4   Period Weeks   Status On-going           PT Long Term Goals - 04/01/15 1139    PT LONG  TERM GOAL #1   Title Pt to be  able to ambulate for 13f with supervision and RW   Baseline 12/20- about 367fwith walker, min guard    Time 8   Period Weeks   Status On-going   PT LONG TERM GOAL #2   Title Pt will demonstrate full indep in all transfers and bed mobility in order to decrease caregiver burden.    Baseline 12/20- able to perform bed mobility today with modified techniquea nd extended time, Mod(I); still requires Min guard for transfers    Time 8   Period Weeks   Status On-going   PT LONG TERM GOAL #3   Title Pt will demonstrate improvement in strength 1 mm grade  to improve safety of ambulation    Time 8   Period Weeks   Status On-going   PT LONG TERM GOAL #4   Title Patient to report no falls within the past 3 weeks in order to demonstrate improved balance and reduced risk of injury with mobility    Time 8   Period Weeks   Status New               Plan - 04/10/15 1344    Clinical Impression Statement Focused on core and proximal muscle activation with dynamic functional activities in sitting at edge of mat table; some difficulty and apprehension noted from patient with more advanced activiites but able to complete in modified fashion with min guard from PT. Also performed some weight bearing and pre-gait activities such as sit to stands with controlled slow lower and weight shifting in parallel bars. Also attempted some standing exercises in parallel bars but limited by tone today especially with prolonged activities.    Pt will benefit from skilled therapeutic intervention in order to improve on the following deficits Abnormal gait;Decreased endurance;Impaired sensation;Impaired tone;Decreased activity tolerance;Decreased balance;Decreased knowledge of use of DME;Decreased mobility;Decreased range of motion;Decreased safety awareness;Difficulty walking;Decreased strength;Increased muscle spasms;Impaired flexibility   Rehab Potential Good   PT Frequency 2x / week    PT Duration 4 weeks   PT Treatment/Interventions ADLs/Self Care Home Management;Gait training;Functional mobility training;Stair training;Therapeutic activities;Therapeutic exercise;Balance training;Patient/family education;Passive range of motion;Energy conservation   PT Next Visit Plan continuing mat exercises to build core stabilization as a precurser to gait. Continue to practice self care techniques to reduce tone. Pre-gait and weight bearing activities also as precursor to gait.    PT Home Exercise Plan given    Consulted and Agree with Plan  of Care Patient        Problem List Patient Active Problem List   Diagnosis Date Noted  . MS (multiple sclerosis) ([place])   . Neurological deficit present 11/11/2010    Deniece Ree PT, DPT [facility] [address] [place], [place], [address] Phone: [phone]   Fax:  [phone]  Name: Melissa West MRN: [id number] Date of Birth: [birth year]

## 2015-04-15 ENCOUNTER — Ambulatory Visit (HOSPITAL_COMMUNITY): Payer: Medicare Other | Attending: Neurology | Admitting: Physical Therapy

## 2015-04-15 DIAGNOSIS — G35 Multiple sclerosis: Secondary | ICD-10-CM | POA: Diagnosis present

## 2015-04-15 DIAGNOSIS — Z7409 Other reduced mobility: Secondary | ICD-10-CM | POA: Diagnosis not present

## 2015-04-15 DIAGNOSIS — Z789 Other specified health status: Secondary | ICD-10-CM | POA: Diagnosis present

## 2015-04-15 DIAGNOSIS — R2681 Unsteadiness on feet: Secondary | ICD-10-CM | POA: Diagnosis present

## 2015-04-15 DIAGNOSIS — R531 Weakness: Secondary | ICD-10-CM | POA: Diagnosis present

## 2015-04-15 NOTE — Therapy (Signed)
[facility] [place], [place], [address] Phone: [phone]   Fax:  [phone]  Physical Therapy Treatment  Patient Details  Name: Melissa West MRN: [id number] Date of Birth: September 13, 1990 Referring Provider: Merlene Laughter  Encounter Date: 04/15/2015      PT End of Session - 04/15/15 1214    Visit Number 8   Number of Visits 16   Date for PT Re-Evaluation 04/29/15   Authorization Type Medicare/Medicaid   Authorization Time Period 02/27/2015-04/29/2015   Authorization - Visit Number 8   Authorization - Number of Visits 15   PT Start Time 3893   PT Stop Time 1200   PT Time Calculation (min) 48 min   Activity Tolerance Patient tolerated treatment well   Behavior During Therapy [facility] for tasks assessed/performed      Past Medical History  Diagnosis Date  . MS (multiple sclerosis) ([place])   . No pertinent past medical history     Past Surgical History  Procedure Laterality Date  . Cesarean section  08/09/2011    Procedure: CESAREAN SECTION;  Surgeon: Guss Bunde, MD;  Location: [facility];  Service: Gynecology;  Laterality: N/A;    There were no vitals filed for this visit.  Visit Diagnosis:  Impaired mobility and activities of daily living  Poor tolerance for activity  Unsteadiness on feet  Multiple sclerosis (HCC)  Generalized weakness      Subjective Assessment - 04/15/15 1230    Subjective Patient doing well today   Currently in Pain? No/denies                         [facility] Adult PT Treatment/Exercise - 04/15/15 1211    Transfers   Transfers Sit to Stand   Ambulation/Gait   Ambulation Distance (Feet) 40 Feet  X2   Assistive device Rolling walker   Lumbar Exercises: Standing   Other Standing Lumbar Exercises sit to stand 5 reps no UE assist with slow lower for muscle control 1x10   Lumbar Exercises: Supine   Bridge 10 reps  AA   Straight Leg Raise 10 reps  AA   Lumbar Exercises:  Sidelying   Hip Abduction 10 reps  AA   Lumbar Exercises: Prone   Straight Leg Raise 10 reps  AA                  PT Short Term Goals - 04/01/15 1132    PT SHORT TERM GOAL #1   Title Patient will tolerate full stance c RW for 5 minutes, followed by controlled decent to chair.    Baseline 12/20- still working on time but able to control descent better    Time 4   Period Weeks   Status Partially Met   PT SHORT TERM GOAL #2   Title Pt will demonstrate indep in all HEP activities.    Baseline 12/20- doing HEP every other day    Time 2   Period Weeks   Status On-going   PT SHORT TERM GOAL #3   Title Pt mm strength to be improved by 1/2 grade to be able to progress to gait activity   Time 4   Period Weeks   Status On-going   PT SHORT TERM GOAL #4   Title Pt to verbalize that rotation in LE will decrease her tone.   Baseline 12/20- taught self-techniques for passitve rotation for tone in long sitting as well as long duration stretchse  for reducing tone    Time 4   Period Weeks   Status On-going           PT Long Term Goals - 04/01/15 1139    PT LONG TERM GOAL #1   Title Pt to be  able to ambulate for 2f with supervision and RW   Baseline 12/20- about 329fwith walker, min guard    Time 8   Period Weeks   Status On-going   PT LONG TERM GOAL #2   Title Pt will demonstrate full indep in all transfers and bed mobility in order to decrease caregiver burden.    Baseline 12/20- able to perform bed mobility today with modified techniquea nd extended time, Mod(I); still requires Min guard for transfers    Time 8   Period Weeks   Status On-going   PT LONG TERM GOAL #3   Title Pt will demonstrate improvement in strength 1 mm grade  to improve safety of ambulation    Time 8   Period Weeks   Status On-going   PT LONG TERM GOAL #4   Title Patient to report no falls within the past 3 weeks in order to demonstrate improved balance and reduced risk of injury with  mobility    Time 8   Period Weeks   Status New               Plan - 04/15/15 1215    Clinical Impression Statement Continued focus on imcreasing LE strength.  Completed mat activities with min assist required to complete.  Hamstrings are the weakest mm at this point with no active contttraction  achieved.   PT encouraged to complete more sit to stand actvities at home (at sink for safety)   Pt will benefit from skilled therapeutic intervention in order to improve on the following deficits Abnormal gait;Decreased endurance;Impaired sensation;Impaired tone;Decreased activity tolerance;Decreased balance;Decreased knowledge of use of DME;Decreased mobility;Decreased range of motion;Decreased safety awareness;Difficulty walking;Decreased strength;Increased muscle spasms;Impaired flexibility   Rehab Potential Good   PT Frequency 2x / week   PT Duration 4 weeks   PT Treatment/Interventions ADLs/Self Care Home Management;Gait training;Functional mobility training;Stair training;Therapeutic activities;Therapeutic exercise;Balance training;Patient/family education;Passive range of motion;Energy conservation   PT Next Visit Plan continuing mat exercises to build core stabilization as a precurser to gait. Continue to practice self care techniques to reduce tone. Pre-gait and weight bearing activities also as precursor to gait.    PT Home Exercise Plan given    Consulted and Agree with Plan of Care Patient        Problem List Patient Active Problem List   Diagnosis Date Noted  . [place]  . Neurological deficit present 11/11/2010   AmTeena IraniPTA/CLT [phone]/06/2015, 12:31 PM  [place]: [phone] Fax:  [phone]: Melissa BicknellRN: [id number] of Birth: 7/22-Mar-1991

## 2015-04-17 ENCOUNTER — Ambulatory Visit (HOSPITAL_COMMUNITY): Payer: Medicare Other | Admitting: Physical Therapy

## 2015-04-17 DIAGNOSIS — R2681 Unsteadiness on feet: Secondary | ICD-10-CM

## 2015-04-17 DIAGNOSIS — G35 Multiple sclerosis: Secondary | ICD-10-CM

## 2015-04-17 DIAGNOSIS — Z789 Other specified health status: Secondary | ICD-10-CM

## 2015-04-17 DIAGNOSIS — R531 Weakness: Secondary | ICD-10-CM

## 2015-04-17 DIAGNOSIS — Z7409 Other reduced mobility: Secondary | ICD-10-CM

## 2015-04-17 NOTE — Therapy (Signed)
[facility] [place], [place], [address] Phone: [phone]   Fax:  [phone]  Physical Therapy Treatment  Patient Details  Name: Melissa West MRN: [id number] Date of Birth: 09/24/1990 Referring Provider: Merlene Laughter  Encounter Date: 04/17/2015      PT End of Session - 04/17/15 1434    Visit Number 9   Number of Visits 16   Date for PT Re-Evaluation 04/29/15   Authorization Type Medicare/Medicaid   Authorization - Visit Number 9   Authorization - Number of Visits 15   PT Start Time 9798   PT Stop Time 1433   PT Time Calculation (min) 41 min   Equipment Utilized During Treatment Gait belt   Activity Tolerance Patient limited by fatigue   Behavior During Therapy [facility] for tasks assessed/performed      Past Medical History  Diagnosis Date  . MS (multiple sclerosis) ([place])   . No pertinent past medical history     Past Surgical History  Procedure Laterality Date  . Cesarean section  08/09/2011    Procedure: CESAREAN SECTION;  Surgeon: Guss Bunde, MD;  Location: [facility];  Service: Gynecology;  Laterality: N/A;    There were no vitals filed for this visit.  Visit Diagnosis:  Impaired mobility and activities of daily living  Poor tolerance for activity  Unsteadiness on feet  Multiple sclerosis (HCC)  Generalized weakness      Subjective Assessment - 04/17/15 1407    Subjective Pt states that she has been lazy and she has not been standing or walking;  She has done some of her exercises.    Currently in Pain? No/denies                [facility] Adult PT Treatment/Exercise - 04/17/15 0001    Transfers   Sit to Stand 5: Supervision   Ambulation/Gait   Ambulation Distance (Feet) 22 Feet  x2 then 10 feet x 1   Assistive device Rolling walker   Gait Comments Min guard for safety, unsteady and wheelchair follow    Lumbar Exercises: Standing   Other Standing Lumbar Exercises sit to stand 5 reps no  UE assist with slow lower for muscle control 1x10   Other Standing Lumbar Exercises side stepping at mat.    Lumbar Exercises: Seated   Other Seated Lumbar Exercises yellow medicine ball twists x 10    Lumbar Exercises: Prone   Other Prone Lumbar Exercises crawling, tall kneeling x 10                   PT Short Term Goals - 04/01/15 1132    PT SHORT TERM GOAL #1   Title Patient will tolerate full stance c RW for 5 minutes, followed by controlled decent to chair.    Baseline 12/20- still working on time but able to control descent better    Time 4   Period Weeks   Status Partially Met   PT SHORT TERM GOAL #2   Title Pt will demonstrate indep in all HEP activities.    Baseline 12/20- doing HEP every other day    Time 2   Period Weeks   Status On-going   PT SHORT TERM GOAL #3   Title Pt mm strength to be improved by 1/2 grade to be able to progress to gait activity   Time 4   Period Weeks   Status On-going   PT SHORT TERM GOAL #4   Title Pt  to verbalize that rotation in LE will decrease her tone.   Baseline 12/20- taught self-techniques for passitve rotation for tone in long sitting as well as long duration stretchse for reducing tone    Time 4   Period Weeks   Status On-going           PT Long Term Goals - 04/01/15 1139    PT LONG TERM GOAL #1   Title Pt to be  able to ambulate for 13f with supervision and RW   Baseline 12/20- about 38fwith walker, min guard    Time 8   Period Weeks   Status On-going   PT LONG TERM GOAL #2   Title Pt will demonstrate full indep in all transfers and bed mobility in order to decrease caregiver burden.    Baseline 12/20- able to perform bed mobility today with modified techniquea nd extended time, Mod(I); still requires Min guard for transfers    Time 8   Period Weeks   Status On-going   PT LONG TERM GOAL #3   Title Pt will demonstrate improvement in strength 1 mm grade  to improve safety of ambulation    Time 8   Period  Weeks   Status On-going   PT LONG TERM GOAL #4   Title Patient to report no falls within the past 3 weeks in order to demonstrate improved balance and reduced risk of injury with mobility    Time 8   Period Weeks   Status New               Plan - 04/17/15 1435    Clinical Impression Statement Pt treatment focused on keeping upright when walking, (pt tends to lean forward about 30 degrees).  Pt keeps knees stiff and puts most of her weight on her UE with the walker when walking whch causes quick fatigue.  Verbal cuing to try and rectify this was not successful.  Pt is showing better initiation of motin with crawling.    PT Next Visit Plan continuing mat exercises to build core stabilization as a precurser to gait. Continue to practice self care techniques to reduce tone. Pre-gait and weight bearing activities also as precursor to gait.         Problem List Patient Active Problem List   Diagnosis Date Noted  . [place] (multiple [place]) ([place]  . Neurological deficit present 11/11/2010    CyRayetta HumphreyPT CLT [phone]/08/2015, 2:39 PM  [id number]: [phone] Fax:  [phone]: VeReniyah GooteeRN: [id number] of Birth: 7/Aug 14, 1990

## 2015-04-22 ENCOUNTER — Ambulatory Visit (HOSPITAL_COMMUNITY): Payer: Medicare Other

## 2015-04-24 ENCOUNTER — Ambulatory Visit (HOSPITAL_COMMUNITY): Payer: Medicare Other | Admitting: Physical Therapy

## 2015-04-29 ENCOUNTER — Ambulatory Visit (HOSPITAL_COMMUNITY): Payer: Medicare Other | Admitting: Physical Therapy

## 2015-04-29 DIAGNOSIS — R2681 Unsteadiness on feet: Secondary | ICD-10-CM

## 2015-04-29 DIAGNOSIS — Z7409 Other reduced mobility: Secondary | ICD-10-CM

## 2015-04-29 DIAGNOSIS — Z789 Other specified health status: Secondary | ICD-10-CM

## 2015-04-29 DIAGNOSIS — R531 Weakness: Secondary | ICD-10-CM

## 2015-04-29 DIAGNOSIS — G35 Multiple sclerosis: Secondary | ICD-10-CM

## 2015-04-29 NOTE — Therapy (Signed)
[facility] [address] [place], [place], [address] Phone: [phone]   Fax:  [phone]  Physical Therapy Treatment (Re-Assessment)  Patient Details  Name: Melissa West MRN: [id number] Date of Birth: 1990-09-07 Referring Provider: Jerre Simon   Encounter Date: 04/29/2015      PT End of Session - 04/29/15 1503    Visit Number 10   Number of Visits 16   Date for PT Re-Evaluation 05/27/15   Authorization Type Medicare/Medicaid   Authorization Time Period 02/27/2015-04/29/2015; 04/30/15 to 06/28/15   Authorization - Visit Number 10   Authorization - Number of Visits 20   PT Start Time 1025   PT Stop Time 1108   PT Time Calculation (min) 43 min   Equipment Utilized During Treatment Gait belt   Activity Tolerance Patient limited by fatigue   Behavior During Therapy [facility] for tasks assessed/performed      Past Medical History  Diagnosis Date  . MS (multiple sclerosis) (HCC)   . No pertinent past medical history     Past Surgical History  Procedure Laterality Date  . Cesarean section  08/09/2011    Procedure: CESAREAN SECTION;  Surgeon: Lesly Dukes, MD;  Location: [facility];  Service: Gynecology;  Laterality: N/A;    There were no vitals filed for this visit.  Visit Diagnosis:  Impaired mobility and activities of daily living - Plan: PT plan of care cert/re-cert  Poor tolerance for activity - Plan: PT plan of care cert/re-cert  Unsteadiness on feet - Plan: PT plan of care cert/re-cert  Multiple sclerosis (HCC) - Plan: PT plan of care cert/re-cert  Generalized weakness - Plan: PT plan of care cert/re-cert      Subjective Assessment - 04/29/15 1024    Subjective Patient reports that she fell recently and hit the side of her right knee, which feels like it had been getting better; she reports that [employer] is still asking to speak directly to therapist before he will make the braces. Patient does report that she feels she is  getting better; feels like walking may be getting a little easier for her but she is still having to hold onto wall.    Pertinent History Pt has had MS for seven years, with a constant gradual decline. Pt denies any notable exacerbations of Sx. Pt lives c 3yo son, and gets help c childcare from mother and aunt. Pt uses WC for most mobility in home, except for toiletting due to space restrictions.    How long can you sit comfortably? continuing to use wheel chair for primary mobilty    Currently in Pain? Yes   Pain Score 4    Pain Orientation Right            [facility] PT Assessment - 04/29/15 0001    Assessment   Medical Diagnosis MS   Referring Provider [place]    Onset Date/Surgical Date 06/07/14   Next MD Visit February 2017   Prior Therapy years ago    Balance Screen   Has the patient fallen in the past 6 months Yes   How many times? recent fall at home    Has the patient had a decrease in activity level because of a fear of falling?  Yes   Is the patient reluctant to leave their home because of a fear of falling?  No   Prior Function   Level of Independence Independent   Observation/Other Assessments   Observations able to stand approx 2 min  15 seconds with walker, min guard    Strength   Right Hip Extension 2-/5   Right Hip ABduction 2/5   Left Hip Extension 2-/5   Left Hip ABduction 2-/5   Right Knee Flexion 2/5  limited by tone    Right Knee Extension 4/5   Left Knee Flexion 2/5  limited by tone    Left Knee Extension 4/5   Right Ankle Dorsiflexion 2/5   Left Ankle Dorsiflexion 2+/5   Transfers   Sit to Stand 4: Min guard   RLE Tone   RLE Tone Moderate   LLE Tone   LLE Tone Moderate                     [facility] Adult PT Treatment/Exercise - 04/29/15 0001    Ambulation/Gait   Ambulation Distance (Feet) 20 Feet   Assistive device Rolling walker   Gait Comments Min guard for safety, very unsteady and wheelchair follow                 PT  Education - 04/29/15 1503    Education provided Yes   Education Details plan of care moving forward, progress towards goals; [employer] should be calling her soon per evaluating PT who called [employer] today    Person(s) Educated Patient   Methods Explanation   Comprehension Verbalized understanding          PT Short Term Goals - 04/29/15 1056    PT SHORT TERM GOAL #1   Title Patient will tolerate full stance c RW for 5 minutes, followed by controlled decent to chair.    Baseline 1/17- 2 minutes, 15 seconds, plop to chair    Time 4   Period Weeks   Status On-going   PT SHORT TERM GOAL #2   Title Pt will demonstrate indep in all HEP activities.    Baseline 1/17- patient reports she is doing very well with these    Time 2   Period Weeks   Status Achieved   PT SHORT TERM GOAL #3   Title Pt mm strength to be improved by 1/2 grade to be able to progress to gait activity   Time 4   Period Weeks   Status On-going   PT SHORT TERM GOAL #4   Title Pt to verbalize that rotation in LE will decrease her tone.   Baseline 1/17/ has not been doing HEP, needed further education today    Time 4   Period Weeks   Status On-going           PT Long Term Goals - 04/29/15 1058    PT LONG TERM GOAL #1   Title Pt to be  able to ambulate for 70ft with supervision and RW   Baseline 1/17- 33ft at best    Time 8   Period Weeks   Status On-going   PT LONG TERM GOAL #2   Title Pt will demonstrate full indep in all transfers and bed mobility in order to decrease caregiver burden.    Baseline 1/17- more shaky with transfers today, did demonstrate some unsafe trasnsfer technique with scooting back on bed    Time 8   Period Weeks   Status On-going   PT LONG TERM GOAL #3   Title Pt will demonstrate improvement in strength 1 mm grade  to improve safety of ambulation    Time 8   Period Weeks   Status On-going   PT LONG TERM GOAL #4  Title Patient to report no falls within the past 3 weeks in order  to demonstrate improved balance and reduced risk of injury with mobility    Baseline May 25, 2022- recent fall    Time 8   Period Weeks   Status On-going               Plan - 05-26-2015 1504    Clinical Impression Statement Re-assessment performed today. Patient reports that she fell recently and hurt her knee; screened knee with only palpable tenderness on medial portion with no excessive edema, bruising, or temperature changes noted today. Upon examination, no significant changes noted in terms of functional gains; patient still has not obtained AFO/KAFO/s and evaluating PT called [employer] today, who reported that she is in system and company should be calling her soon. Due to recent fall as well as need to ensure safety and function with AFO/KAFO/s, recommend extending skilled PT services in order to address functional limitations and assist in reaching optimal level of function.    Pt will benefit from skilled therapeutic intervention in order to improve on the following deficits Abnormal gait;Decreased endurance;Impaired sensation;Impaired tone;Decreased activity tolerance;Decreased balance;Decreased knowledge of use of DME;Decreased mobility;Decreased range of motion;Decreased safety awareness;Difficulty walking;Decreased strength;Increased muscle spasms;Impaired flexibility   Rehab Potential Good   PT Frequency Other (comment)  1-2x/week   PT Duration 4 weeks   PT Treatment/Interventions ADLs/Self Care Home Management;Gait training;Functional mobility training;Stair training;Therapeutic activities;Therapeutic exercise;Balance training;Patient/family education;Passive range of motion;Energy conservation   PT Next Visit Plan F/U on AFOs/KAFOs. continuing mat exercises to build core stabilization as a precurser to gait. Continue to practice self care techniques to reduce tone. Pre-gait and weight bearing activities also as precursor to gait.    PT Home Exercise Plan given; gave patient reprinted  copies of all previously given HEPS today as well    Consulted and Agree with Plan of Care Patient          G-Codes - May 26, 2015 1509    Functional Assessment Tool Used Based on skilled clinical assessment of mobility, balance, strength, tone, ambulation, fall risk    Functional Limitation Mobility: Walking and moving around   Mobility: Walking and Moving Around Current Status [phone]) At least 80 percent but less than 100 percent impaired, limited or restricted   Mobility: Walking and Moving Around Goal Status [phone]) At least 60 percent but less than 80 percent impaired, limited or restricted      Problem List Patient Active Problem List   Diagnosis Date Noted  . MS (multiple sclerosis) (HCC)   . Neurological deficit present 11/11/2010   Physical Therapy Progress Note  Dates of Reporting Period: 04/01/15 to 05/26/2015  Objective Reports of Subjective Statement: see above   Objective Measurements: see above   Goal Update: see above   Plan: see above   Reason Skilled Services are Required: impaired strength and balance, high fall risk, gait difficulty, difficulty with functional mobility, spasticity, develop advanced HEP, follow up on safety and function of AFOs/KAFOs    Nedra Hai PT, DPT [phone]  [facility] [address] [place], [place], [address] Phone: [phone]   Fax:  [phone]  Name: Melissa West MRN: [id number] Date of Birth: [birth year]

## 2015-05-01 ENCOUNTER — Ambulatory Visit (HOSPITAL_COMMUNITY): Payer: Medicare Other

## 2015-05-01 DIAGNOSIS — Z789 Other specified health status: Secondary | ICD-10-CM

## 2015-05-01 DIAGNOSIS — Z7409 Other reduced mobility: Secondary | ICD-10-CM | POA: Diagnosis not present

## 2015-05-01 DIAGNOSIS — R2681 Unsteadiness on feet: Secondary | ICD-10-CM

## 2015-05-01 DIAGNOSIS — R531 Weakness: Secondary | ICD-10-CM

## 2015-05-01 DIAGNOSIS — G35 Multiple sclerosis: Secondary | ICD-10-CM

## 2015-05-01 NOTE — Therapy (Signed)
[facility] [address] [place], [place], [address] Phone: [phone]   Fax:  [phone]  Physical Therapy Treatment  Patient Details  Name: Melissa West MRN: [id number] Date of Birth: [birth year] Referring Provider: Jerre Simon   Encounter Date: 05/01/2015      PT End of Session - 05/01/15 1033    Visit Number 11   Number of Visits 16   Date for PT Re-Evaluation 05/27/15   Authorization Type Medicare/Medicaid   Authorization Time Period 02/27/2015-04/29/2015; 04/30/15 to 06/28/15   Authorization - Visit Number 11   Authorization - Number of Visits 20   PT Start Time 1027   PT Stop Time 1105   PT Time Calculation (min) 38 min   Equipment Utilized During Treatment Gait belt   Activity Tolerance Patient limited by fatigue   Behavior During Therapy [facility] for tasks assessed/performed      Past Medical History  Diagnosis Date  . MS (multiple sclerosis) (HCC)   . No pertinent past medical history     Past Surgical History  Procedure Laterality Date  . Cesarean section  08/09/2011    Procedure: CESAREAN SECTION;  Surgeon: Lesly Dukes, MD;  Location: [facility];  Service: Gynecology;  Laterality: N/A;    There were no vitals filed for this visit.  Visit Diagnosis:  Impaired mobility and activities of daily living  Poor tolerance for activity  Unsteadiness on feet  Multiple sclerosis (HCC)  Generalized weakness      Subjective Assessment - 05/01/15 1027    Subjective Pt stated her Rt knee is a little sore today following fall, current pain scale 2/10.   Pertinent History Pt has had MS for seven years, with a constant gradual decline. Pt denies any notable exacerbations of Sx. Pt lives c 3yo son, and gets help c childcare from mother and aunt. Pt uses WC for most mobility in home, except for toiletting due to space restrictions.    Currently in Pain? Yes   Pain Score 2    Pain Location Knee   Pain Orientation Right                [facility] Adult PT Treatment/Exercise - 05/01/15 0001    Transfers   Transfers Sit to Stand;Stand Pivot Transfers   Sit to Stand 4: Min guard  10x with HHA    Stand Pivot Transfers 4: Min guard   Ambulation/Gait   Ambulation Distance (Feet) 20 Feet   Assistive device Rolling walker   Gait Comments Min guard for safety, very unsteady and wheelchair follow    Lumbar Exercises: Standing   Other Standing Lumbar Exercises attempted side stepping at mat, too fatigued   Lumbar Exercises: Seated   Sit to Stand 10 reps   Sit to Stand Limitations eccentric lowering   Other Seated Lumbar Exercises yellow medicine ball twists x 10    Lumbar Exercises: Prone   Other Prone Lumbar Exercises crawling, tall kneeling x 10              PT Short Term Goals - 04/29/15 1056    PT SHORT TERM GOAL #1   Title Patient will tolerate full stance c RW for 5 minutes, followed by controlled decent to chair.    Baseline 1/17- 2 minutes, 15 seconds, plop to chair    Time 4   Period Weeks   Status On-going   PT SHORT TERM GOAL #2   Title Pt will demonstrate indep in all HEP activities.  Baseline 1/17- patient reports she is doing very well with these    Time 2   Period Weeks   Status Achieved   PT SHORT TERM GOAL #3   Title Pt mm strength to be improved by 1/2 grade to be able to progress to gait activity   Time 4   Period Weeks   Status On-going   PT SHORT TERM GOAL #4   Title Pt to verbalize that rotation in LE will decrease her tone.   Baseline 1/17/ has not been doing HEP, needed further education today    Time 4   Period Weeks   Status On-going           PT Long Term Goals - 04/29/15 1058    PT LONG TERM GOAL #1   Title Pt to be  able to ambulate for 37ft with supervision and RW   Baseline 1/17- 28ft at best    Time 8   Period Weeks   Status On-going   PT LONG TERM GOAL #2   Title Pt will demonstrate full indep in all transfers and bed mobility in order to  decrease caregiver burden.    Baseline 1/17- more shaky with transfers today, did demonstrate some unsafe trasnsfer technique with scooting back on bed    Time 8   Period Weeks   Status On-going   PT LONG TERM GOAL #3   Title Pt will demonstrate improvement in strength 1 mm grade  to improve safety of ambulation    Time 8   Period Weeks   Status On-going   PT LONG TERM GOAL #4   Title Patient to report no falls within the past 3 weeks in order to demonstrate improved balance and reduced risk of injury with mobility    Baseline 1/17- recent fall    Time 8   Period Weeks   Status On-going               Plan - 05/01/15 1207    Clinical Impression Statement Continued session focus on improving core stabilization and proximal musculature strengtheing as a precurser to gait.  Pt continues to exhibit significant fatigue with visible musculature fatigue.  Pt with moderate verbal and tactile cueing through session to improve posture (30 degrees trunk flexion) and reduce HHA to increase muscle activaiton for LE strengthening with crawling, kneeling and gait activities.  Pt stated she plans to contact MD for perscription for AFO/KAFOs, will continue being in contacted with [employer] for orthotics.   PT Next Visit Plan F/U on AFOs/KAFOs. continuing mat exercises to build core stabilization as a precurser to gait. Continue to practice self care techniques to reduce tone. Pre-gait and weight bearing activities also as precursor to gait.         Problem List Patient Active Problem List   Diagnosis Date Noted  . MS (multiple sclerosis) (HCC)   . Neurological deficit present 11/11/2010   Becky Sax, LPTA; CBIS [phone]  Juel Burrow 05/01/2015, 1:11 PM  [place] [address] [place], [place], [address] Phone: [phone]   Fax:  [phone]  Name: Melissa West MRN: [id number] Date of Birth: [birth year]

## 2015-05-06 ENCOUNTER — Ambulatory Visit (HOSPITAL_COMMUNITY): Payer: Medicare Other

## 2015-05-06 DIAGNOSIS — Z7409 Other reduced mobility: Secondary | ICD-10-CM

## 2015-05-06 DIAGNOSIS — G35 Multiple sclerosis: Secondary | ICD-10-CM

## 2015-05-06 DIAGNOSIS — Z789 Other specified health status: Secondary | ICD-10-CM

## 2015-05-06 DIAGNOSIS — R2681 Unsteadiness on feet: Secondary | ICD-10-CM

## 2015-05-06 DIAGNOSIS — R531 Weakness: Secondary | ICD-10-CM

## 2015-05-06 NOTE — Therapy (Signed)
[facility] [address] [place], [place], [address] Phone: [phone]   Fax:  [phone]  Physical Therapy Treatment  Patient Details  Name: Melissa West MRN: [id number] Date of Birth: 1990/09/18 Referring Provider: Jerre Simon   Encounter Date: 05/06/2015      PT End of Session - 05/06/15 1025    Visit Number 12   Number of Visits 16   Date for PT Re-Evaluation 05/27/15   Authorization Type Medicare/Medicaid   Authorization Time Period 02/27/2015-04/29/2015; 04/30/15 to 06/28/15   Authorization - Visit Number 12   Authorization - Number of Visits 29   PT Start Time 1018   PT Stop Time 1103   PT Time Calculation (min) 45 min   Equipment Utilized During Treatment Gait belt   Activity Tolerance Patient limited by fatigue   Behavior During Therapy [facility] for tasks assessed/performed      Past Medical History  Diagnosis Date  . MS (multiple sclerosis) (HCC)   . No pertinent past medical history     Past Surgical History  Procedure Laterality Date  . Cesarean section  08/09/2011    Procedure: CESAREAN SECTION;  Surgeon: Lesly Dukes, MD;  Location: [facility];  Service: Gynecology;  Laterality: N/A;    There were no vitals filed for this visit.  Visit Diagnosis:  Impaired mobility and activities of daily living  Poor tolerance for activity  Unsteadiness on feet  Multiple sclerosis (HCC)  Generalized weakness      Subjective Assessment - 05/06/15 1023    Subjective Pain free today, has been walking around the house.  No reports of falls this weekend   Pertinent History Pt has had MS for seven years, with a constant gradual decline. Pt denies any notable exacerbations of Sx. Pt lives c 3yo son, and gets help c childcare from mother and aunt. Pt uses WC for most mobility in home, except for toiletting due to space restrictions.    Patient Stated Goals improve stability on feet for transfers and ambulation.    Currently in  Pain? No/denies                         [facility] Adult PT Treatment/Exercise - 05/06/15 0001    Transfers   Transfers Sit to Stand;Stand Pivot Transfers   Sit to Stand 4: Min guard   Stand Pivot Transfers 4: Min guard   Ambulation/Gait   Ambulation Distance (Feet) 16 Feet   Assistive device Rolling walker   Gait Comments Min guard for safety, very unsteady and wheelchair follow    Lumbar Exercises: Standing   Other Standing Lumbar Exercises sit to stand 5 reps no UE assist with slow lower for muscle control 1x10   Lumbar Exercises: Seated   Sit to Stand 10 reps   Sit to Stand Limitations eccentric lowering   Other Seated Lumbar Exercises yellow medicine ball twists x 10 ; Throw and catch sitting on edge of mat   Lumbar Exercises: Prone   Other Prone Lumbar Exercises crawling, tall kneeling x 10    Knee/Hip Exercises: Standing   Hip Abduction 5 reps;Both                  PT Short Term Goals - 04/29/15 1056    PT SHORT TERM GOAL #1   Title Patient will tolerate full stance c RW for 5 minutes, followed by controlled decent to chair.    Baseline 1/17- 2 minutes, 15  seconds, plop to chair    Time 4   Period Weeks   Status On-going   PT SHORT TERM GOAL #2   Title Pt will demonstrate indep in all HEP activities.    Baseline 1/17- patient reports she is doing very well with these    Time 2   Period Weeks   Status Achieved   PT SHORT TERM GOAL #3   Title Pt mm strength to be improved by 1/2 grade to be able to progress to gait activity   Time 4   Period Weeks   Status On-going   PT SHORT TERM GOAL #4   Title Pt to verbalize that rotation in LE will decrease her tone.   Baseline 1/17/ has not been doing HEP, needed further education today    Time 4   Period Weeks   Status On-going           PT Long Term Goals - 04/29/15 1058    PT LONG TERM GOAL #1   Title Pt to be  able to ambulate for 25ft with supervision and RW   Baseline 1/17- 53ft at best     Time 8   Period Weeks   Status On-going   PT LONG TERM GOAL #2   Title Pt will demonstrate full indep in all transfers and bed mobility in order to decrease caregiver burden.    Baseline 1/17- more shaky with transfers today, did demonstrate some unsafe trasnsfer technique with scooting back on bed    Time 8   Period Weeks   Status On-going   PT LONG TERM GOAL #3   Title Pt will demonstrate improvement in strength 1 mm grade  to improve safety of ambulation    Time 8   Period Weeks   Status On-going   PT LONG TERM GOAL #4   Title Patient to report no falls within the past 3 weeks in order to demonstrate improved balance and reduced risk of injury with mobility    Baseline 1/17- recent fall    Time 8   Period Weeks   Status On-going               Plan - 05/06/15 1032    Clinical Impression Statement Continued session focus on improving core stabilization and proximal musculature strengthening as a precurser to gait.  Pt does continue to exhibit significant core and LE weakness with visible musculature fatigue.  Therapist facilitation required for safety and proper technique with all exercises.  Gait training complete with 1 therapist min guard and PT tech with WC, pt with increased fatigue with gait at end of session with less assistance.  Noted WC unsafe requiring assistance to keep breaks from rolling back, referral sent to MD for new WC.   PT Next Visit Plan F/U on [facility] referral and AFOs/KAFOs. continuing mat exercises to build core stabilization as a precurser to gait. Continue to practice self care techniques to reduce tone. Pre-gait and weight bearing activities also as precursor to gait.         Problem List Patient Active Problem List   Diagnosis Date Noted  . MS (multiple sclerosis) (HCC)   . Neurological deficit present 11/11/2010  Becky Sax, LPTA; CBIS [phone]   Juel Burrow 05/06/2015, 12:13 PM  [place] [address] [place], [place], [address] Phone: [phone]   Fax:  [phone]  Name: Melissa West MRN: [id number] Date of Birth: [birth year]

## 2015-05-08 ENCOUNTER — Ambulatory Visit (HOSPITAL_COMMUNITY): Payer: Medicare Other

## 2015-05-13 ENCOUNTER — Ambulatory Visit (HOSPITAL_COMMUNITY): Payer: Medicare Other | Admitting: Physical Therapy

## 2015-05-13 DIAGNOSIS — G35 Multiple sclerosis: Secondary | ICD-10-CM

## 2015-05-13 DIAGNOSIS — Z7409 Other reduced mobility: Secondary | ICD-10-CM | POA: Diagnosis not present

## 2015-05-13 DIAGNOSIS — R531 Weakness: Secondary | ICD-10-CM

## 2015-05-13 DIAGNOSIS — R2681 Unsteadiness on feet: Secondary | ICD-10-CM

## 2015-05-13 DIAGNOSIS — Z789 Other specified health status: Secondary | ICD-10-CM

## 2015-05-13 NOTE — Therapy (Addendum)
[place], [place], [address] Phone: [phone]   Fax:  [phone]  Physical Therapy Treatment  Patient Details  Name: Melissa West MRN: [id number] Date of Birth: 06-Oct-1990 Referring Provider: Sampson Goon   Encounter Date: 05/13/2015      PT End of Session - 05/13/15 1047    Visit Number 13   Number of Visits 16   Date for PT Re-Evaluation 05/27/15   Authorization Type Medicare/Medicaid   Authorization Time Period 02/27/2015-04/29/2015; 04/30/15 to 06/28/15   Authorization - Visit Number 13   Authorization - Number of Visits 16   PT Start Time 1018   PT Stop Time 1100   PT Time Calculation (min) 42 min   Equipment Utilized During Treatment Gait belt   Activity Tolerance Patient tolerated treatment well;Patient limited by fatigue   Behavior During Therapy [facility] for tasks assessed/performed      Past Medical History  Diagnosis Date  . MS (multiple sclerosis) ([place])   . No pertinent past medical history     Past Surgical History  Procedure Laterality Date  . Cesarean section  08/09/2011    Procedure: CESAREAN SECTION;  Surgeon: Guss Bunde, MD;  Location: [facility];  Service: Gynecology;  Laterality: N/A;    There were no vitals filed for this visit.  Visit Diagnosis:  Impaired mobility and activities of daily living  Poor tolerance for activity  Unsteadiness on feet  Multiple sclerosis (HCC)  Generalized weakness      Subjective Assessment - 05/13/15 1021    Subjective Pt states she has not pain she just doesn't feel well.    Pertinent History Pt has had MS for seven years, with a constant gradual decline. Pt denies any notable exacerbations of Sx. Pt lives c 83yo son, and gets help c childcare from mother and aunt. Pt uses WC for most mobility in home, except for toiletting due to space restrictions.    Currently in Pain? No/denies                [facility] Adult PT Treatment/Exercise -  05/13/15 0001    Transfers   Transfers Sit to Stand;Stand Pivot Transfers   Sit to Stand 5: Supervision   Ambulation/Gait   Ambulation Distance (Feet) --  12 ft; 20 ft 38f. with rolling walker and supervision;short rests in between    Lumbar Exercises: Standing   Functional Squats 10 reps   Other Standing Lumbar Exercises sit to stand 5 reps no UE assist with slow lower for muscle control 1x10   Other Standing Lumbar Exercises side step x 2 Rt; stand with one hand assist x 15" x 3                   PT Short Term Goals - 04/29/15 1056    PT SHORT TERM GOAL #1   Title Patient will tolerate full stance c RW for 5 minutes, followed by controlled decent to chair.    Baseline 1/17- 2 minutes, 15 seconds, plop to chair    Time 4   Period Weeks   Status On-going   PT SHORT TERM GOAL #2   Title Pt will demonstrate indep in all HEP activities.    Baseline 1/17- patient reports she is doing very well with these    Time 2   Period Weeks   Status Achieved   PT SHORT TERM GOAL #3   Title Pt mm strength to be improved by 1/2 grade  to be able to progress to gait activity   Time 4   Period Weeks   Status On-going   PT SHORT TERM GOAL #4   Title Pt to verbalize that rotation in LE will decrease her tone.   Baseline 1/17/ has not been doing HEP, needed further education today    Time 4   Period Weeks   Status On-going           PT Long Term Goals - 04/29/15 1058    PT LONG TERM GOAL #1   Title Pt to be  able to ambulate for 62f with supervision and RW   Baseline 1/17- 4103fat best    Time 8   Period Weeks   Status On-going   PT LONG TERM GOAL #2   Title Pt will demonstrate full indep in all transfers and bed mobility in order to decrease caregiver burden.    Baseline 1/17- more shaky with transfers today, did demonstrate some unsafe trasnsfer technique with scooting back on bed    Time 8   Period Weeks   Status On-going   PT LONG TERM GOAL #3   Title Pt will  demonstrate improvement in strength 1 mm grade  to improve safety of ambulation    Time 8   Period Weeks   Status On-going   PT LONG TERM GOAL #4   Title Patient to report no falls within the past 3 weeks in order to demonstrate improved balance and reduced risk of injury with mobility    Baseline 1/17- recent fall    Time 8   Period Weeks   Status On-going               Plan - 05/13/15 1048    Clinical Impression Statement Pt ambulates with hyperextended knees and significant anterior rotation of pelvis.  Pt demonstrated improved ability to advance LE during ambulation.   Pt prescription for new wheelchair has not came in at this time but orthotist has [facility] pt [facility] and balance exercises werer facilitated by therapist for pt safety.    PT [facility] . continuing mat exercises to build core stabilization as a precurser to gait. Continue to practice self care techniques to reduce tone.      [id number]   Problem List Patient Active Problem List   Diagnosis Date Noted  . [place]  . Neurological deficit present 11/11/2010    CyRayetta HumphreyPT CLT [id number]/31/2017, 11:01 AM  [id number]: [phone] Fax:  [phone]: Melissa GrenRN: [id number] of Birth: [birth year]PHYSICAL THERAPY DISCHARGE SUMMARY 06/05/2016 Visits from Start of Care: 13  Current functional level related to goals / functional outcomes: PT ambulating with walker for short distances at home Remaining deficits: Easily fatigued   Education / Equipment: HEP  Plan: Patient agrees to discharge.  Patient goals were partially met. Patient is being discharged due to the patient's request.  ?????       CyRayetta HumphreyPTJesupLT [phone]

## 2015-05-15 ENCOUNTER — Ambulatory Visit (HOSPITAL_COMMUNITY): Payer: Medicare Other | Attending: Neurology | Admitting: Physical Therapy

## 2015-05-15 ENCOUNTER — Telehealth (HOSPITAL_COMMUNITY): Payer: Self-pay | Admitting: Physical Therapy

## 2015-05-15 NOTE — Telephone Encounter (Signed)
Pt thought that her appointment was at 10:15.  There are no 10:15 appointments available.  Pt was rescheduled.  Virgina Organ, PT CLT [phone]

## 2015-06-03 ENCOUNTER — Telehealth (HOSPITAL_COMMUNITY): Payer: Self-pay | Admitting: Physical Therapy

## 2015-06-03 ENCOUNTER — Ambulatory Visit (HOSPITAL_COMMUNITY): Payer: Medicare Other | Admitting: Physical Therapy

## 2015-06-03 NOTE — Telephone Encounter (Signed)
Called patient regarding missed appointment and reminded of next appointment 2/23. Lurena Nida, PTA/CLT [phone]

## 2015-06-05 ENCOUNTER — Telehealth (HOSPITAL_COMMUNITY): Payer: Self-pay | Admitting: Physical Therapy

## 2015-06-05 ENCOUNTER — Ambulatory Visit (HOSPITAL_COMMUNITY): Payer: Medicare Other | Admitting: Physical Therapy

## 2015-06-05 NOTE — Telephone Encounter (Signed)
Pt  requested to be D/C today, She is sick

## 2015-06-15 ENCOUNTER — Encounter (HOSPITAL_COMMUNITY): Payer: Self-pay | Admitting: Cardiology

## 2015-06-15 ENCOUNTER — Emergency Department (HOSPITAL_COMMUNITY): Payer: Medicare Other

## 2015-06-15 ENCOUNTER — Emergency Department (HOSPITAL_COMMUNITY)
Admission: EM | Admit: 2015-06-15 | Discharge: 2015-06-15 | Disposition: A | Discharge status: Home / Self Care | Payer: Medicare Other | Attending: Emergency Medicine | Admitting: Emergency Medicine

## 2015-06-15 DIAGNOSIS — Y999 Unspecified external cause status: Secondary | ICD-10-CM | POA: Insufficient documentation

## 2015-06-15 DIAGNOSIS — Y9389 Activity, other specified: Secondary | ICD-10-CM | POA: Insufficient documentation

## 2015-06-15 DIAGNOSIS — W050XXA Fall from non-moving wheelchair, initial encounter: Secondary | ICD-10-CM | POA: Diagnosis not present

## 2015-06-15 DIAGNOSIS — Y929 Unspecified place or not applicable: Secondary | ICD-10-CM | POA: Insufficient documentation

## 2015-06-15 DIAGNOSIS — S93401A Sprain of unspecified ligament of right ankle, initial encounter: Secondary | ICD-10-CM

## 2015-06-15 DIAGNOSIS — G35 Multiple sclerosis: Secondary | ICD-10-CM | POA: Insufficient documentation

## 2015-06-15 DIAGNOSIS — S8991XA Unspecified injury of right lower leg, initial encounter: Secondary | ICD-10-CM | POA: Diagnosis present

## 2015-06-15 DIAGNOSIS — S8001XA Contusion of right knee, initial encounter: Secondary | ICD-10-CM | POA: Diagnosis not present

## 2015-06-15 LAB — URINALYSIS, ROUTINE W REFLEX MICROSCOPIC
Glucose, UA: NEGATIVE mg/dL
Hgb urine dipstick: NEGATIVE
KETONES UR: 40 mg/dL — AB
LEUKOCYTES UA: NEGATIVE
NITRITE: NEGATIVE
PROTEIN: NEGATIVE mg/dL
Specific Gravity, Urine: >1.03 — ABNORMAL HIGH (ref 1.005–1.030)
pH: 5.5 (ref 5.0–8.0)

## 2015-06-15 NOTE — Discharge Instructions (Signed)
Ankle Sprain °An ankle sprain is an injury to the strong, fibrous tissues (ligaments) that hold the bones of your ankle joint together.  °CAUSES °An ankle sprain is usually caused by a fall or by twisting your ankle. Ankle sprains most commonly occur when you step on the outer edge of your foot, and your ankle turns inward. People who participate in sports are more prone to these types of injuries.  °SYMPTOMS  °· Pain in your ankle. The pain may be present at rest or only when you are trying to stand or walk. °· Swelling. °· Bruising. Bruising may develop immediately or within 1 to 2 days after your injury. °· Difficulty standing or walking, particularly when turning corners or changing directions. °DIAGNOSIS  °Your caregiver will ask you details about your injury and perform a physical exam of your ankle to determine if you have an ankle sprain. During the physical exam, your caregiver will press on and apply pressure to specific areas of your foot and ankle. Your caregiver will try to move your ankle in certain ways. An X-ray exam may be done to be sure a bone was not broken or a ligament did not separate from one of the bones in your ankle (avulsion fracture).  °TREATMENT  °Certain types of braces can help stabilize your ankle. Your caregiver can make a recommendation for this. Your caregiver may recommend the use of medicine for pain. If your sprain is severe, your caregiver may refer you to a surgeon who helps to restore function to parts of your skeletal system (orthopedist) or a physical therapist. °HOME CARE INSTRUCTIONS  °· Apply ice to your injury for 1-2 days or as directed by your caregiver. Applying ice helps to reduce inflammation and pain. °¨ Put ice in a plastic bag. °¨ Place a towel between your skin and the bag. °¨ Leave the ice on for 15-20 minutes at a time, every 2 hours while you are awake. °· Only take over-the-counter or prescription medicines for pain, discomfort, or fever as directed by  your caregiver. °· Elevate your injured ankle above the level of your heart as much as possible for 2-3 days. °· If your caregiver recommends crutches, use them as instructed. Gradually put weight on the affected ankle. Continue to use crutches or a cane until you can walk without feeling pain in your ankle. °· If you have a plaster splint, wear the splint as directed by your caregiver. Do not rest it on anything harder than a pillow for the first 24 hours. Do not put weight on it. Do not get it wet. You may take it off to take a shower or bath. °· You may have been given an elastic bandage to wear around your ankle to provide support. If the elastic bandage is too tight (you have numbness or tingling in your foot or your foot becomes cold and blue), adjust the bandage to make it comfortable. °· If you have an air splint, you may blow more air into it or let air out to make it more comfortable. You may take your splint off at night and before taking a shower or bath. Wiggle your toes in the splint several times per day to decrease swelling. °SEEK MEDICAL CARE IF:  °· You have rapidly increasing bruising or swelling. °· Your toes feel extremely cold or you lose feeling in your foot. °· Your pain is not relieved with medicine. °SEEK IMMEDIATE MEDICAL CARE IF: °· Your toes are numb or blue. °·   You have severe pain that is increasing. °MAKE SURE YOU:  °· Understand these instructions. °· Will watch your condition. °· Will get help right away if you are not doing well or get worse. °  °This information is not intended to replace advice given to you by your health care provider. Make sure you discuss any questions you have with your health care provider. °  °Document Released: 03/29/2005 Document Revised: 04/19/2014 Document Reviewed: 04/10/2011 °Elsevier Interactive Patient Education ©2016 Elsevier Inc. ° °Contusion °A contusion is a deep bruise. Contusions are the result of a blunt injury to tissues and muscle fibers  under the skin. The injury causes bleeding under the skin. The skin overlying the contusion may turn blue, purple, or yellow. Minor injuries will give you a painless contusion, but more severe contusions may stay painful and swollen for a few weeks.  °CAUSES  °This condition is usually caused by a blow, trauma, or direct force to an area of the body. °SYMPTOMS  °Symptoms of this condition include: °· Swelling of the injured area. °· Pain and tenderness in the injured area. °· Discoloration. The area may have redness and then turn blue, purple, or yellow. °DIAGNOSIS  °This condition is diagnosed based on a physical exam and medical history. An X-ray, CT scan, or MRI may be needed to determine if there are any associated injuries, such as broken bones (fractures). °TREATMENT  °Specific treatment for this condition depends on what area of the body was injured. In general, the best treatment for a contusion is resting, icing, applying pressure to (compression), and elevating the injured area. This is often called the RICE strategy. Over-the-counter anti-inflammatory medicines may also be recommended for pain control.  °HOME CARE INSTRUCTIONS  °· Rest the injured area. °· If directed, apply ice to the injured area: °¨ Put ice in a plastic bag. °¨ Place a towel between your skin and the bag. °¨ Leave the ice on for 20 minutes, 2-3 times per day. °· If directed, apply light compression to the injured area using an elastic bandage. Make sure the bandage is not wrapped too tightly. Remove and reapply the bandage as directed by your health care provider. °· If possible, raise (elevate) the injured area above the level of your heart while you are sitting or lying down. °· Take over-the-counter and prescription medicines only as told by your health care provider. °SEEK MEDICAL CARE IF: °· Your symptoms do not improve after several days of treatment. °· Your symptoms get worse. °· You have difficulty moving the injured  area. °SEEK IMMEDIATE MEDICAL CARE IF:  °· You have severe pain. °· You have numbness in a hand or foot. °· Your hand or foot turns pale or cold. °  °This information is not intended to replace advice given to you by your health care provider. Make sure you discuss any questions you have with your health care provider. °  °Document Released: 01/06/2005 Document Revised: 12/18/2014 Document Reviewed: 08/14/2014 °Elsevier Interactive Patient Education ©2016 Elsevier Inc. ° °

## 2015-06-15 NOTE — ED Notes (Signed)
Pt attempted to urinate, unable at this time. Will continue to drink fluids.

## 2015-06-15 NOTE — ED Provider Notes (Signed)
[name]: [id number]     Arrival date & time 06/15/15  1191 History   First MD Initiated Contact with Patient 06/15/15 1034     Chief Complaint  Patient presents with  . Fall     (Consider location/radiation/quality/duration/timing/severity/associated sxs/prior Treatment) Patient is a 25 y.o. female presenting with fall. The history is provided by the patient. No language interpreter was used.  Fall This is a new problem. The current episode started today. The problem occurs constantly. The problem has been gradually worsening. Associated symptoms include arthralgias. Nothing aggravates the symptoms. She has tried nothing for the symptoms. The treatment provided moderate relief.   Pt complains of pain in her right ankle and right knee.   Pt reports she did not lock wheel chair and she slipped and fell. Past Medical History  Diagnosis Date  . MS (multiple sclerosis) (H[facility])   . No pertinent past medical history    Past Surgical History  Procedure Laterality Date  . Cesarean section  08/09/2011    Procedure: CESAREAN SECTION;  Surgeon: Lesly Dukes, MD;  Location: [facility];  Service: Gynecology;  Laterality: N/A;   Family History  Problem Relation Age of Onset  . Diabetes Maternal Grandmother   . Hypertension Maternal Grandmother   . Kidney disease Maternal Grandmother    Social History  Substance Use Topics  . Smoking status: Never Smoker   . Smokeless tobacco: Never Used  . Alcohol Use: No   OB History    Gravida Para Term Preterm AB TAB SAB Ectopic Multiple Living   0 0 0 0 0 1 2     Review of Systems  Musculoskeletal: Positive for arthralgias.  All other systems reviewed and are negative.     Allergies  Review of patient's allergies indicates no known allergies.  Home Medications   Prior to Admission medications   Medication Sig Start Date End Date Taking? Authorizing Provider  AMPYRA 10 MG TB12 Take 10 mg by mouth every 12 (twelve) hours. 05/22/15  Yes  Historical Provider, MD  ibuprofen (ADVIL,MOTRIN) 600 MG tablet Take 1 tablet (600 mg total) by mouth every 6 (six) hours as needed for pain. Patient not taking: Reported on 06/15/2015 07/02/12   Sunnie Nielsen, MD   BP 136/78 mmHg  Pulse 65  Temp(Src) 98.3 F (36.8 C) (Oral)  Resp 14  Ht  (1.6 m)  Wt 83.915 kg  BMI 32.78 kg/m2  SpO2 100%  LMP 05/23/2015 Physical Exam  Constitutional: She is oriented to person, place, and time. She appears well-developed and well-nourished.  HENT:  Head: Normocephalic and atraumatic.  Eyes: EOM are normal. Pupils are equal, round, and reactive to light.  Neck: Normal range of motion.  Cardiovascular: Normal rate and normal heart sounds.   Pulmonary/Chest: Effort normal.  Abdominal: She exhibits no distension.  Musculoskeletal:  Tender right ankle,  Bruised right knee  Pain with range of motion. nv and ns intact  Neurological: She is alert and oriented to person, place, and time.  Skin: Skin is warm.  Psychiatric: She has a normal mood and affect.  Nursing note and vitals reviewed.   ED Course  Procedures (including critical care time) Labs Review Labs Reviewed  URINALYSIS, ROUTINE W REFLEX MICROSCOPIC (NOT AT [facility]) - Abnormal; Notable for the following:    APPearance HAZY (*)    Specific Gravity, Urine >1.030 (*)    Bilirubin Urine SMALL (*)    Ketones, ur 40 (*)    All  other components within normal limits    Imaging Review Dg Ankle Complete Right  06/15/2015  CLINICAL DATA:  Pain post fall EXAM: RIGHT ANKLE - COMPLETE 3+ VIEW COMPARISON:  12/10/2006 FINDINGS: There is no evidence of fracture, dislocation, or joint effusion. There is no evidence of arthropathy or other focal bone abnormality. Soft tissues are unremarkable. IMPRESSION: Negative. Electronically Signed   By: Corlis Leak M.D.   On: 06/15/2015 10:31   Dg Knee Complete 4 Views Right  06/15/2015  CLINICAL DATA:  Pt states she fell out of her wheelchair this morning. Pt is c/o of  right knee and right ankle pain. Pt was weak and unable to gold leg for images. Cross table lateral projections attempted. Best obtainable images presented. EXAM: RIGHT KNEE - COMPLETE 4+ VIEW COMPARISON:  None. FINDINGS: There is no evidence of fracture, dislocation, or joint effusion. There is no evidence of arthropathy or other focal bone abnormality. Soft tissues are unremarkable. IMPRESSION: Negative. Electronically Signed   By: Corlis Leak M.D.   On: 06/15/2015 10:30   I have personally reviewed and evaluated these images and lab results as part of my medical decision-making.   EKG Interpretation None      MDM Pt has had urinary incontinence x 2.  ua shows ketones, no infection.  Pt advised to follow up with her neurologist    Final diagnoses:  Contusion, knee, right, initial encounter  Ankle sprain, right, initial encounter    An After Visit Summary was printed and given to the patient.    Elson Areas, PA-C 06/15/15 [address] [place], [place] 06/15/15 1610  Bethann Berkshire, MD 06/18/15 1501

## 2015-06-15 NOTE — ED Notes (Signed)
Pt states she was attempting to transfer from her wheelchair and thinks she may have forgotten to put the brake on which caused her to fall forward onto carpeted floor. Pt c/o R knee pain and R ankle pain. No LOC.

## 2015-06-15 NOTE — ED Notes (Signed)
Patient moved and spilled urine sample in bedpan. Will give patient time and collect again.  Patient linens changed peri care performed.

## 2015-06-15 NOTE — ED Notes (Signed)
Fall this morning while getting in wheelchair.  Also c/o not being able to hold her urine times 2 days.

## 2015-06-18 ENCOUNTER — Encounter (HOSPITAL_COMMUNITY)
Admission: RE | Admit: 2015-06-18 | Discharge: 2015-06-18 | Disposition: A | Discharge status: Home / Self Care | Payer: Medicare Other | Source: Ambulatory Visit | Attending: Neurology | Admitting: Neurology

## 2015-06-18 NOTE — Progress Notes (Signed)
Pt called to cancell solumedrol infusion apt for today. States she is so weak she can't get off sofa to come.

## 2015-06-19 ENCOUNTER — Encounter (HOSPITAL_COMMUNITY)
Admission: RE | Admit: 2015-06-19 | Discharge: 2015-06-19 | Disposition: A | Discharge status: Home / Self Care | Payer: Medicare Other | Source: Ambulatory Visit | Attending: Neurology | Admitting: Neurology

## 2015-06-19 NOTE — Progress Notes (Signed)
Patient no-show for rescheduled appointment. Unable to reach by phone.

## 2015-06-20 ENCOUNTER — Encounter (HOSPITAL_COMMUNITY)
Admission: RE | Admit: 2015-06-20 | Discharge: 2015-06-20 | Disposition: A | Discharge status: Home / Self Care | Payer: Medicare Other | Source: Ambulatory Visit | Attending: Neurology | Admitting: Neurology

## 2015-06-20 NOTE — Progress Notes (Signed)
Attempted to contact patient regarding missed appointments and need to reschedule. Unable to reach per phone number provided.

## 2015-06-27 ENCOUNTER — Telehealth (HOSPITAL_COMMUNITY): Payer: Self-pay

## 2015-06-27 NOTE — Telephone Encounter (Signed)
Called pt to schedule Wheelchair eval and she said she was stress out about all this to just to get a wheelchair. Pt states we should already know that she needs it. Patient will call us back later. She did want to D/c from Pt told Cindy Nf 06/27/15

## 2015-06-30 ENCOUNTER — Telehealth (HOSPITAL_COMMUNITY): Payer: Self-pay

## 2015-06-30 NOTE — Telephone Encounter (Signed)
06/30/15 Had referral for w/c evaluation, called patient and she didn't understand why she had to come back to our office for an evaluation.  She had previously had PT here and she has also been in the process of talking to [facility] at Dr. Ronal Fear office - she wanted to just get a script for the wheelchair and go get herself.  She has also been talking with Harriett Sine in our office and according to those notes she would call when she was ready to schedule but in talking to her she wasn't coming back to our office.  I asked my supervisor, Leia Alf, to call her and see if she could get her to understand why she needed to come back and she tried to explain but the patient said she wasn't coming back for no face-to-face evluation and would call her dr to see if they would just call a script in and get a wheelchair that wasn't fitted for her particular needs.  I called Dr. Ronal Fear office back and let them know what was going on.

## 2015-07-02 ENCOUNTER — Encounter (HOSPITAL_COMMUNITY)
Admission: RE | Admit: 2015-07-02 | Discharge: 2015-07-02 | Disposition: A | Discharge status: Home / Self Care | Payer: Medicare Other | Source: Ambulatory Visit | Attending: Neurology | Admitting: Neurology

## 2015-07-02 DIAGNOSIS — G35 Multiple sclerosis: Secondary | ICD-10-CM | POA: Diagnosis present

## 2015-07-02 MED ORDER — SODIUM CHLORIDE 0.9 % IV SOLN
1000.0000 mg | Freq: Every day | INTRAVENOUS | Status: DC
  Administered 2015-07-02: 1000 mg via INTRAVENOUS
  Filled 2015-07-02: qty 8

## 2015-07-02 MED ORDER — SODIUM CHLORIDE 0.9 % IV SOLN
INTRAVENOUS | Status: DC
  Administered 2015-07-02: 12:00:00 via INTRAVENOUS

## 2015-07-03 ENCOUNTER — Encounter (HOSPITAL_COMMUNITY)
Admission: RE | Admit: 2015-07-03 | Discharge: 2015-07-03 | Disposition: A | Discharge status: Home / Self Care | Payer: Medicare Other | Source: Ambulatory Visit | Attending: Neurology | Admitting: Neurology

## 2015-07-03 DIAGNOSIS — G35 Multiple sclerosis: Secondary | ICD-10-CM | POA: Diagnosis not present

## 2015-07-03 MED ORDER — SODIUM CHLORIDE 0.9 % IV SOLN
1000.0000 mg | Freq: Every day | INTRAVENOUS | Status: DC
  Administered 2015-07-03: 1000 mg via INTRAVENOUS
  Filled 2015-07-03: qty 8

## 2015-07-03 MED ORDER — ACETAMINOPHEN 500 MG PO TABS
ORAL_TABLET | ORAL | Status: AC
  Filled 2015-07-03: qty 1

## 2015-07-03 MED ORDER — ACETAMINOPHEN 500 MG PO TABS
500.0000 mg | ORAL_TABLET | Freq: Once | ORAL | Status: AC
  Administered 2015-07-03: 500 mg via ORAL

## 2015-07-03 MED ORDER — SODIUM CHLORIDE 0.9 % IV SOLN
Freq: Once | INTRAVENOUS | Status: AC
  Administered 2015-07-03: 250 mL via INTRAVENOUS

## 2015-07-04 ENCOUNTER — Encounter (HOSPITAL_COMMUNITY)
Admission: RE | Admit: 2015-07-04 | Discharge: 2015-07-04 | Disposition: A | Discharge status: Home / Self Care | Payer: Medicare Other | Source: Ambulatory Visit | Attending: Neurology | Admitting: Neurology

## 2015-07-04 DIAGNOSIS — G35 Multiple sclerosis: Secondary | ICD-10-CM | POA: Diagnosis not present

## 2015-07-04 MED ORDER — ACETAMINOPHEN 500 MG PO TABS
ORAL_TABLET | ORAL | Status: AC
  Filled 2015-07-04: qty 1

## 2015-07-04 MED ORDER — SODIUM CHLORIDE 0.9 % IV SOLN
INTRAVENOUS | Status: DC
  Administered 2015-07-04: 250 mL via INTRAVENOUS

## 2015-07-04 MED ORDER — ACETAMINOPHEN 500 MG PO TABS
500.0000 mg | ORAL_TABLET | Freq: Once | ORAL | Status: AC
  Administered 2015-07-04: 500 mg via ORAL

## 2015-07-04 MED ORDER — SODIUM CHLORIDE 0.9 % IV SOLN
1000.0000 mg | Freq: Every day | INTRAVENOUS | Status: AC
  Administered 2015-07-04: 1000 mg via INTRAVENOUS
  Filled 2015-07-04: qty 8

## 2015-07-04 MED ORDER — METHYLPREDNISOLONE SODIUM SUCC 125 MG IJ SOLR: 1000.0000 mg | Freq: Every day | INTRAMUSCULAR | Status: DC

## 2015-07-16 ENCOUNTER — Encounter (HOSPITAL_COMMUNITY): Payer: Self-pay

## 2015-08-28 ENCOUNTER — Other Ambulatory Visit (HOSPITAL_COMMUNITY): Payer: Self-pay | Admitting: Respiratory Therapy

## 2015-08-28 DIAGNOSIS — G4733 Obstructive sleep apnea (adult) (pediatric): Secondary | ICD-10-CM

## 2015-08-28 DIAGNOSIS — G35 Multiple sclerosis: Secondary | ICD-10-CM

## 2015-08-28 DIAGNOSIS — R5383 Other fatigue: Secondary | ICD-10-CM

## 2015-08-28 DIAGNOSIS — R2689 Other abnormalities of gait and mobility: Secondary | ICD-10-CM

## 2015-10-06 ENCOUNTER — Ambulatory Visit: Payer: Medicare Other | Attending: Neurology | Admitting: Neurology

## 2015-10-06 DIAGNOSIS — Z79899 Other long term (current) drug therapy: Secondary | ICD-10-CM | POA: Insufficient documentation

## 2015-10-06 DIAGNOSIS — G35 Multiple sclerosis: Secondary | ICD-10-CM | POA: Diagnosis not present

## 2015-10-06 DIAGNOSIS — R5383 Other fatigue: Secondary | ICD-10-CM | POA: Insufficient documentation

## 2015-10-06 DIAGNOSIS — R0683 Snoring: Secondary | ICD-10-CM | POA: Insufficient documentation

## 2015-10-06 DIAGNOSIS — R2689 Other abnormalities of gait and mobility: Secondary | ICD-10-CM | POA: Insufficient documentation

## 2015-10-06 DIAGNOSIS — G4733 Obstructive sleep apnea (adult) (pediatric): Secondary | ICD-10-CM | POA: Insufficient documentation

## 2015-10-15 NOTE — Procedures (Signed)
  [facility] Payson Crumby A. Gerilyn Pilgrim, MD     [email or web address]             NOCTURNAL POLYSOMNOGRAPHY   LOCATION: [facility]   Patient Name: Michie, Luallen Date: 10/06/2015 Gender: Female D.O.B: April 08, 1991 Age (years): 24 Referring Provider: Not Available Height (inches): 62 Interpreting Physician: Beryle Beams MD, ABSM Weight (lbs): 185 RPSGT: Alfonso Ellis BMI: 34 MRN: [id number] Neck Size: 16.00 CLINICAL INFORMATION Sleep Study Type: NPSG Indication for sleep study: Fatigue Epworth Sleepiness Score: 20 SLEEP STUDY TECHNIQUE As per the AASM Manual for the Scoring of Sleep and Associated Events v2.3 (April 2016) with a hypopnea requiring 4% desaturations. The channels recorded and monitored were frontal, central and occipital EEG, electrooculogram (EOG), submentalis EMG (chin), nasal and oral airflow, thoracic and abdominal wall motion, anterior tibialis EMG, snore microphone, electrocardiogram, and pulse oximetry. MEDICATIONS Patient's medications include: N/A. Medications self-administered by patient during sleep study : No sleep medicine administered.  Current outpatient prescriptions:  .  AMPYRA 10 MG TB12, Take 10 mg by mouth every 12 (twelve) hours., Disp: , Rfl:  .  ibuprofen (ADVIL,MOTRIN) 600 MG tablet, Take 1 tablet (600 mg total) by mouth every 6 (six) hours as needed for pain. (Patient not taking: Reported on 06/15/2015), Disp: 30 tablet, Rfl: 0  SLEEP ARCHITECTURE The study was initiated at 10:32:11 PM and ended at 5:15:41 AM. Sleep onset time was 4.3 minutes and the sleep efficiency was 87.3%. The total sleep time was 352.2 minutes. Stage REM latency was 2.5 minutes. The patient spent 0.85% of the night in stage N1 sleep, 56.36% in stage N2 sleep, 26.03% in stage N3 and 16.75% in REM. Alpha intrusion was absent. Supine sleep was 100.00%. RESPIRATORY PARAMETERS The overall apnea/hypopnea index (AHI) was 0.0 per hour. There were 0 total  apneas, including 0 obstructive, 0 central and 0 mixed apneas. There were 0 hypopneas and 0 RERAs. The AHI during Stage REM sleep was 0.0 per hour. AHI while supine was 0.0 per hour. The mean oxygen saturation was 97.28%. The minimum SpO2 during sleep was 93.00%. Soft snoring was noted during this study. CARDIAC DATA The 2 lead EKG demonstrated sinus rhythm. The mean heart rate was N/A beats per minute. Other EKG findings include: None. LEG MOVEMENT DATA The total PLMS were 0 with a resulting PLMS index of 0.00. Associated arousal with leg movement index was 0.0.   IMPRESSIONS - Unremarable sleep study.  Argie Ramming, MD Diplomate, American Board of Sleep Medicine.

## 2016-02-25 ENCOUNTER — Encounter (HOSPITAL_COMMUNITY)
Admission: RE | Admit: 2016-02-25 | Discharge: 2016-02-25 | Disposition: A | Discharge status: Home / Self Care | Payer: Medicare Other | Source: Ambulatory Visit | Attending: Neurology | Admitting: Neurology

## 2016-02-25 DIAGNOSIS — G35 Multiple sclerosis: Secondary | ICD-10-CM | POA: Diagnosis present

## 2016-02-25 MED ORDER — DIPHENHYDRAMINE HCL 50 MG/ML IJ SOLN
50.0000 mg | Freq: Once | INTRAMUSCULAR | Status: AC
  Administered 2016-02-25: 50 mg via INTRAMUSCULAR

## 2016-02-25 MED ORDER — SODIUM CHLORIDE 0.9 % IV SOLN
Freq: Once | INTRAVENOUS | Status: AC
  Administered 2016-02-25: 200 mL via INTRAVENOUS

## 2016-02-25 MED ORDER — SODIUM CHLORIDE 0.9 % IV SOLN
500.0000 mg | Freq: Once | INTRAVENOUS | Status: AC
  Administered 2016-02-25: 500 mg via INTRAVENOUS
  Filled 2016-02-25: qty 4

## 2016-02-25 MED ORDER — DIPHENHYDRAMINE HCL 50 MG/ML IJ SOLN
INTRAMUSCULAR | Status: AC
Start: 2016-02-25 — End: 2016-02-25
  Filled 2016-02-25: qty 1

## 2016-02-25 MED ORDER — SODIUM CHLORIDE 0.9 % IV SOLN
300.0000 mg | Freq: Once | INTRAVENOUS | Status: AC
  Administered 2016-02-25: 300 mg via INTRAVENOUS
  Filled 2016-02-25: qty 10

## 2016-02-25 MED ORDER — DIPHENHYDRAMINE HCL 25 MG PO CAPS
ORAL_CAPSULE | ORAL | Status: AC
  Filled 2016-02-25: qty 2

## 2016-02-25 NOTE — Progress Notes (Signed)
Tolerated lst dose ocrevus well.

## 2016-03-10 ENCOUNTER — Encounter (HOSPITAL_COMMUNITY)
Admission: RE | Admit: 2016-03-10 | Discharge: 2016-03-10 | Disposition: A | Discharge status: Home / Self Care | Payer: Medicare Other | Source: Ambulatory Visit | Attending: Neurology | Admitting: Neurology

## 2016-03-10 DIAGNOSIS — G35 Multiple sclerosis: Secondary | ICD-10-CM | POA: Insufficient documentation

## 2016-03-10 MED ORDER — SODIUM CHLORIDE 0.9 % IV SOLN
INTRAVENOUS | Status: DC
  Administered 2016-03-10: 250 mL via INTRAVENOUS

## 2016-03-10 MED ORDER — SODIUM CHLORIDE 0.9 % IV SOLN
500.0000 mg | Freq: Once | INTRAVENOUS | Status: AC
  Administered 2016-03-10: 500 mg via INTRAVENOUS
  Filled 2016-03-10: qty 4

## 2016-03-10 MED ORDER — DIPHENHYDRAMINE HCL 25 MG PO CAPS
50.0000 mg | ORAL_CAPSULE | Freq: Once | ORAL | Status: AC
  Administered 2016-03-10: 50 mg via ORAL

## 2016-03-10 MED ORDER — DIPHENHYDRAMINE HCL 25 MG PO CAPS
ORAL_CAPSULE | ORAL | Status: AC
  Filled 2016-03-10: qty 2

## 2016-03-10 MED ORDER — DIPHENHYDRAMINE HCL 50 MG/ML IJ SOLN: 50.0000 mg | Freq: Once | INTRAMUSCULAR | Status: DC

## 2016-03-10 MED ORDER — SODIUM CHLORIDE 0.9 % IV SOLN
300.0000 mg | Freq: Once | INTRAVENOUS | Status: AC
  Administered 2016-03-10: 300 mg via INTRAVENOUS
  Filled 2016-03-10: qty 10

## 2016-06-03 ENCOUNTER — Emergency Department (HOSPITAL_COMMUNITY): Payer: Medicare Other

## 2016-06-03 ENCOUNTER — Encounter (HOSPITAL_COMMUNITY): Payer: Self-pay | Admitting: Emergency Medicine

## 2016-06-03 ENCOUNTER — Emergency Department (HOSPITAL_COMMUNITY)
Admission: EM | Admit: 2016-06-03 | Discharge: 2016-06-03 | Disposition: A | Discharge status: Home / Self Care | Payer: Medicare Other | Attending: Emergency Medicine | Admitting: Emergency Medicine

## 2016-06-03 DIAGNOSIS — Y999 Unspecified external cause status: Secondary | ICD-10-CM | POA: Insufficient documentation

## 2016-06-03 DIAGNOSIS — Y929 Unspecified place or not applicable: Secondary | ICD-10-CM | POA: Diagnosis not present

## 2016-06-03 DIAGNOSIS — S93401A Sprain of unspecified ligament of right ankle, initial encounter: Secondary | ICD-10-CM | POA: Insufficient documentation

## 2016-06-03 DIAGNOSIS — Y939 Activity, unspecified: Secondary | ICD-10-CM | POA: Diagnosis not present

## 2016-06-03 DIAGNOSIS — S99911A Unspecified injury of right ankle, initial encounter: Secondary | ICD-10-CM | POA: Diagnosis present

## 2016-06-03 DIAGNOSIS — W1839XA Other fall on same level, initial encounter: Secondary | ICD-10-CM | POA: Diagnosis not present

## 2016-06-03 MED ORDER — SULFAMETHOXAZOLE-TRIMETHOPRIM 800-160 MG PO TABS: 1.0000 | ORAL_TABLET | Freq: Two times a day (BID) | ORAL | 0 refills | Status: AC

## 2016-06-03 MED ORDER — IBUPROFEN 600 MG PO TABS: 600.0000 mg | ORAL_TABLET | Freq: Four times a day (QID) | ORAL | 0 refills | Status: DC | PRN

## 2016-06-03 NOTE — ED Triage Notes (Signed)
Pt reports falling one week ago injuring right foot.  Pt has MS which caused the fall.  Pt alert and oriented.

## 2016-06-03 NOTE — ED Provider Notes (Signed)
[facility] DEPT Provider Note   [name]: [id number] Arrival date & time: 06/03/16  1142     History   Chief Complaint Chief Complaint  Patient presents with  . Fall    HPI Melissa West is a 26 y.o. female with a history of MS causing a fall one week ago. She reports she is mostly wheelchair bound but stands  for self transfers to the toilet and bed.  She fell while getting to her wheelchair one week ago and has persistent swelling in the right foot. She believes she inverted her ankle during the fall. She endorses also having a wound on the right foot 4th toe which from the fall which is improving.  She is concerned about persistent swelling in the foot.  She continues to have tenderness along the right ankle and proximal foot. She has had no treatment prior to arrival including no ice or elevation. Her feet are dependent most the day as she sits in her wheelchair.  The history is provided by the patient and a parent.    Past Medical History:  Diagnosis Date  . MS (multiple sclerosis) (HCC)   . No pertinent past medical history     Patient Active Problem List   Diagnosis Date Noted  . MS (multiple sclerosis) (HCC)   . Neurological deficit present 11/11/2010    Past Surgical History:  Procedure Laterality Date  . CESAREAN SECTION  08/09/2011   Procedure: CESAREAN SECTION;  Surgeon: Lesly Dukes, MD;  Location: [facility];  Service: Gynecology;  Laterality: N/A;    OB History    Gravida Para Term Preterm AB Living   1 1 1  0 0 2   SAB TAB Ectopic Multiple Live Births   0 0 0 1 2       Home Medications    Prior to Admission medications   Medication Sig Start Date End Date Taking? Authorizing Provider  AMPYRA 10 MG TB12 Take 10 mg by mouth every 12 (twelve) hours. 05/22/15   Historical Provider, MD  ibuprofen (ADVIL,MOTRIN) 600 MG tablet Take 1 tablet (600 mg total) by mouth every 6 (six) hours as needed. 06/03/16   Burgess Amor, PA-C  sulfamethoxazole-trimethoprim  (BACTRIM DS,SEPTRA DS) 800-160 MG tablet Take 1 tablet by mouth 2 (two) times daily. 06/03/16 06/10/16  Burgess Amor, PA-C    Family History Family History  Problem Relation Age of Onset  . Diabetes Maternal Grandmother   . Hypertension Maternal Grandmother   . Kidney disease Maternal Grandmother     Social History Social History  Substance Use Topics  . Smoking status: Never Smoker  . Smokeless tobacco: Never Used  . Alcohol use No     Allergies   Patient has no known allergies.   Review of Systems Review of Systems  Constitutional: Negative for fever.  Musculoskeletal: Positive for arthralgias and joint swelling. Negative for myalgias.  Neurological: Negative for weakness and numbness.     Physical Exam Updated Vital Signs BP 112/84 (BP Location: Left Arm)   Pulse (!) 57   Temp 99.1 F (37.3 C) (Oral)   Resp 18   Ht 5\' 3"  (1.6 m)   Wt 83.9 kg   LMP 05/11/2016 (Exact Date)   SpO2 100%   BMI 32.77 kg/m   Physical Exam  Constitutional: She appears well-developed and well-nourished.  HENT:  Head: Normocephalic.  Cardiovascular: Normal rate and intact distal pulses.  Exam reveals no decreased pulses.   Pulses:      Dorsalis  pedis pulses are 2+ on the right side, and 2+ on the left side.       Posterior tibial pulses are 2+ on the right side, and 2+ on the left side.  Musculoskeletal: She exhibits edema and tenderness.       Right ankle: She exhibits normal range of motion, no swelling, no ecchymosis, no deformity and normal pulse. Tenderness. Lateral malleolus tenderness found. No medial malleolus, no head of 5th metatarsal and no proximal fibula tenderness found. Achilles tendon normal.       Right foot: There is swelling. There is no crepitus and no deformity.  ttp right distal malleolus without deformity.  Soft edema, non pitting right dorsal foot, no bruising, no erythema, no point tenderness.  Healing abrasion right proximal dorsal 4th toe with hard eschar  formation, no drainage, mild erythema distal foot proximal to this skin lesion.  No increased warmth.  Neurological: She is alert. No sensory deficit.  Skin: Skin is warm, dry and intact.  Nursing note and vitals reviewed.    ED Treatments / Results  Labs (all labs ordered are listed, but only abnormal results are displayed) Labs Reviewed - No data to display  EKG  EKG Interpretation None       Radiology Dg Foot Complete Right  Result Date: 06/03/2016 CLINICAL DATA:  Larey Seat 1 week ago injuring the right foot EXAM: RIGHT FOOT COMPLETE - 3+ VIEW COMPARISON:  None. FINDINGS: Tarsal-metatarsal alignment is normal. No acute fracture is seen. Joint spaces appear normal. IMPRESSION: Negative. Electronically Signed   By: Dwyane Dee M.D.   On: 06/03/2016 12:22    Procedures Procedures (including critical care time)  Medications Ordered in ED Medications - No data to display   Initial Impression / Assessment and Plan / ED Course  I have reviewed the triage vital signs and the nursing notes.  Pertinent labs & imaging results that were available during my care of the patient were reviewed by me and considered in my medical decision making (see chart for details).     Images reviewed and discussed with pt.  Suspect edema is from persistent dependency, but with skin lesion and erythema will cove with bactrim.  Discussed elevation, aso provided. Advised prn f/u with pcp if sx persist.  No fractures per imaging, doubt occult fx given old injury.   Final Clinical Impressions(s) / ED Diagnoses   Final diagnoses:  Sprain of right ankle, unspecified ligament, initial encounter    New Prescriptions New Prescriptions   IBUPROFEN (ADVIL,MOTRIN) 600 MG TABLET    Take 1 tablet (600 mg total) by mouth every 6 (six) hours as needed.   SULFAMETHOXAZOLE-TRIMETHOPRIM (BACTRIM DS,SEPTRA DS) 800-160 MG TABLET    Take 1 tablet by mouth 2 (two) times daily.     Burgess Amor, PA-C 06/04/16 0160      Eber Hong, MD 06/04/16 [phone]

## 2016-06-03 NOTE — Discharge Instructions (Signed)
Wear the brace and elevate your foot as much as possible for the next several days to help reduce the swelling.    Use the ibuprofen also for inflammation.  Call your doctor for a recheck of your injury if not improving over the next week.  You may benefit from physical therapy of your ankle if it is not getting better over the next week.  Also, because of the wound on your toe, you are being covered for a possible early skin infection but I still think the swelling is from the sprain as discussed.

## 2016-07-12 ENCOUNTER — Encounter (HOSPITAL_COMMUNITY): Payer: Self-pay

## 2016-07-12 ENCOUNTER — Encounter (HOSPITAL_COMMUNITY)
Admission: RE | Admit: 2016-07-12 | Discharge: 2016-07-12 | Disposition: A | Discharge status: Home / Self Care | Payer: Medicare Other | Source: Ambulatory Visit | Attending: Neurology | Admitting: Neurology

## 2016-07-12 DIAGNOSIS — G35 Multiple sclerosis: Secondary | ICD-10-CM | POA: Insufficient documentation

## 2016-07-12 MED ORDER — SODIUM CHLORIDE 0.9 % IV SOLN
INTRAVENOUS | Status: DC
  Administered 2016-07-12: 250 mL via INTRAVENOUS

## 2016-07-12 MED ORDER — METHYLPREDNISOLONE SODIUM SUCC 1000 MG IJ SOLR
1000.0000 mg | Freq: Every day | INTRAMUSCULAR | Status: DC
  Administered 2016-07-12: 1000 mg via INTRAVENOUS
  Filled 2016-07-12: qty 8

## 2016-07-13 ENCOUNTER — Encounter (HOSPITAL_COMMUNITY)
Admission: RE | Admit: 2016-07-13 | Discharge: 2016-07-13 | Disposition: A | Discharge status: Home / Self Care | Payer: Medicare Other | Source: Ambulatory Visit | Attending: Neurology | Admitting: Neurology

## 2016-07-13 ENCOUNTER — Encounter (HOSPITAL_COMMUNITY): Payer: Self-pay

## 2016-07-13 DIAGNOSIS — G35 Multiple sclerosis: Secondary | ICD-10-CM | POA: Diagnosis not present

## 2016-07-13 MED ORDER — SODIUM CHLORIDE 0.9 % IV SOLN
Freq: Once | INTRAVENOUS | Status: AC
  Administered 2016-07-13: 250 mL via INTRAVENOUS

## 2016-07-13 MED ORDER — SODIUM CHLORIDE 0.9 % IV SOLN
1000.0000 mg | Freq: Every day | INTRAVENOUS | Status: DC
  Administered 2016-07-13: 1000 mg via INTRAVENOUS
  Filled 2016-07-13: qty 8

## 2016-07-14 ENCOUNTER — Encounter (HOSPITAL_COMMUNITY): Payer: Self-pay

## 2016-07-14 ENCOUNTER — Encounter (HOSPITAL_COMMUNITY)
Admission: RE | Admit: 2016-07-14 | Discharge: 2016-07-14 | Disposition: A | Discharge status: Home / Self Care | Payer: Medicare Other | Source: Ambulatory Visit | Attending: Neurology | Admitting: Neurology

## 2016-07-14 DIAGNOSIS — G35 Multiple sclerosis: Secondary | ICD-10-CM | POA: Diagnosis not present

## 2016-07-14 MED ORDER — SODIUM CHLORIDE 0.9 % IV SOLN
1000.0000 mg | Freq: Every day | INTRAVENOUS | Status: AC
  Administered 2016-07-14: 1000 mg via INTRAVENOUS
  Filled 2016-07-14: qty 8

## 2016-07-14 MED ORDER — SODIUM CHLORIDE 0.9 % IV SOLN
Freq: Once | INTRAVENOUS | Status: AC
  Administered 2016-07-14: 250 mL via INTRAVENOUS

## 2016-09-07 ENCOUNTER — Encounter (HOSPITAL_COMMUNITY): Admission: RE | Admit: 2016-09-07 | Payer: Medicare Other | Source: Ambulatory Visit

## 2016-09-07 DIAGNOSIS — G35 Multiple sclerosis: Secondary | ICD-10-CM | POA: Insufficient documentation

## 2016-09-14 ENCOUNTER — Encounter (HOSPITAL_COMMUNITY): Admission: RE | Admit: 2016-09-14 | Payer: Medicare Other | Source: Ambulatory Visit

## 2016-09-20 ENCOUNTER — Encounter (HOSPITAL_COMMUNITY)
Admission: RE | Admit: 2016-09-20 | Discharge: 2016-09-20 | Disposition: A | Discharge status: Home / Self Care | Payer: Medicare Other | Source: Ambulatory Visit | Attending: Neurology | Admitting: Neurology

## 2016-09-21 ENCOUNTER — Encounter (HOSPITAL_COMMUNITY)
Admission: RE | Admit: 2016-09-21 | Discharge: 2016-09-21 | Disposition: A | Discharge status: Home / Self Care | Payer: Medicare Other | Source: Ambulatory Visit | Attending: Neurology | Admitting: Neurology

## 2016-09-21 NOTE — Progress Notes (Signed)
Patient did not show up or call to cancel  appointment today. Dr. Ronal Fear office was notified. Patient called and cancelled 2 appointments and did not show or call for one appointment. Appointment time being blocked for patient is 6 hours. Patient needs to be compliant with medication and scheduled appointments.

## 2016-11-08 ENCOUNTER — Encounter (HOSPITAL_COMMUNITY)
Admission: RE | Admit: 2016-11-08 | Discharge: 2016-11-08 | Disposition: A | Discharge status: Home / Self Care | Payer: Medicare Other | Source: Ambulatory Visit | Attending: Neurology | Admitting: Neurology

## 2016-11-08 DIAGNOSIS — G35 Multiple sclerosis: Secondary | ICD-10-CM | POA: Diagnosis present

## 2016-11-08 MED ORDER — DIPHENHYDRAMINE HCL 25 MG PO CAPS
50.0000 mg | ORAL_CAPSULE | Freq: Once | ORAL | Status: AC
  Administered 2016-11-08: 50 mg via ORAL

## 2016-11-08 MED ORDER — DIPHENHYDRAMINE HCL 25 MG PO CAPS
ORAL_CAPSULE | ORAL | Status: AC
  Filled 2016-11-08: qty 2

## 2016-11-08 MED ORDER — SODIUM CHLORIDE 0.9 % IV SOLN: INTRAVENOUS | Status: DC

## 2016-11-08 MED ORDER — SODIUM CHLORIDE 0.9 % IV SOLN
600.0000 mg | Freq: Once | INTRAVENOUS | Status: DC
  Filled 2016-11-08: qty 20

## 2016-11-08 MED ORDER — SODIUM CHLORIDE 0.9 % IV SOLN
500.0000 mg | Freq: Once | INTRAVENOUS | Status: DC
  Filled 2016-11-08: qty 4

## 2016-11-09 ENCOUNTER — Encounter (HOSPITAL_COMMUNITY)
Admission: RE | Admit: 2016-11-09 | Discharge: 2016-11-09 | Disposition: A | Discharge status: Home / Self Care | Payer: Medicare Other | Source: Ambulatory Visit | Attending: Neurology | Admitting: Neurology

## 2016-11-09 NOTE — Progress Notes (Signed)
Message left on phone informing pt that her ocrevus would not be here until 1100 today and that she did not have to come for her appt til 1030.

## 2016-11-09 NOTE — Progress Notes (Signed)
Pt returned call. Upset and agitated that her ocrevus would not be here until 1100. Stated "I was there yesterday and they told me it would be there at 1000, that is why they made my appointment for 0930." "I want to talk to a supervisor." Explained to pt that the medicine had to come from [facility]. I was unable to explain anything else to her. She was unreasonable. Informed that she needed to talk to the pharmacy. # provided. Stated that she was going to call the pharmacy.

## 2016-11-09 NOTE — Progress Notes (Signed)
Pt notified that ocrevus was here at the hospital and could she come on to hospital today for her infusion. Pt stated "I didn't say I was coming today." Offered to make her an appt for tomorrow,11/10/16. Stated she could not come tomorrow. Asked pt when she would like to come and she stated that she would call back to make an appt. Stated "I just want to know if my medicine is there and when I can get it." I again explained to her that her med was here and she could come now and get her infusion today. Pt refused. Offered to make an appt for later this week. Pt refused. Stated "I will call back and make an appt." Vernona Rieger in pharmacy notified.

## 2016-11-09 NOTE — Progress Notes (Signed)
Pt called. Wanted to know if her ocrevus was available and when she could come for an appt. Asked if she had spoken with pharmacy. Stated no, she did not call them. Informed pt that I would check with pharmacy and call her back. Voiced understanding. Talked with Vernona Rieger in pharmacy. ocrevus is here. Can be infused today.

## 2017-05-06 NOTE — Progress Notes (Signed)
05/06/2017 1502-pt called and wanted to make an appt for her MS medication infusion. Informed pt that we had no orders for her medication and that she needed to contact her Dr for orders. Instructed her that once she sees her Dr for him to fax [place] orders and we would then make her an appt. Voiced understanding.

## 2017-05-06 NOTE — Addendum Note (Signed)
Encounter addended by: Ramiro Harvest, RN on: 05/06/2017 3:08 PM  Actions taken: Sign clinical note

## 2018-01-12 DIAGNOSIS — G35 Multiple sclerosis: Secondary | ICD-10-CM | POA: Diagnosis not present

## 2018-01-12 DIAGNOSIS — Z0000 Encounter for general adult medical examination without abnormal findings: Secondary | ICD-10-CM | POA: Diagnosis not present

## 2018-01-12 DIAGNOSIS — Z7689 Persons encountering health services in other specified circumstances: Secondary | ICD-10-CM | POA: Diagnosis not present

## 2018-01-18 DIAGNOSIS — Z7689 Persons encountering health services in other specified circumstances: Secondary | ICD-10-CM | POA: Diagnosis not present

## 2018-01-20 DIAGNOSIS — Z7689 Persons encountering health services in other specified circumstances: Secondary | ICD-10-CM | POA: Diagnosis not present

## 2018-01-23 DIAGNOSIS — Z7689 Persons encountering health services in other specified circumstances: Secondary | ICD-10-CM | POA: Diagnosis not present

## 2018-01-27 DIAGNOSIS — Z7689 Persons encountering health services in other specified circumstances: Secondary | ICD-10-CM | POA: Diagnosis not present

## 2018-01-30 DIAGNOSIS — Z7689 Persons encountering health services in other specified circumstances: Secondary | ICD-10-CM | POA: Diagnosis not present

## 2018-02-06 DIAGNOSIS — Z7689 Persons encountering health services in other specified circumstances: Secondary | ICD-10-CM | POA: Diagnosis not present

## 2018-02-08 DIAGNOSIS — Z7689 Persons encountering health services in other specified circumstances: Secondary | ICD-10-CM | POA: Diagnosis not present

## 2018-03-22 DIAGNOSIS — Z7689 Persons encountering health services in other specified circumstances: Secondary | ICD-10-CM | POA: Diagnosis not present

## 2018-03-27 DIAGNOSIS — Z7689 Persons encountering health services in other specified circumstances: Secondary | ICD-10-CM | POA: Diagnosis not present

## 2018-03-31 DIAGNOSIS — Z7689 Persons encountering health services in other specified circumstances: Secondary | ICD-10-CM | POA: Diagnosis not present

## 2018-04-21 DIAGNOSIS — Z7689 Persons encountering health services in other specified circumstances: Secondary | ICD-10-CM | POA: Diagnosis not present

## 2018-05-04 DIAGNOSIS — G35 Multiple sclerosis: Secondary | ICD-10-CM | POA: Diagnosis not present

## 2018-05-04 DIAGNOSIS — Z7689 Persons encountering health services in other specified circumstances: Secondary | ICD-10-CM | POA: Diagnosis not present

## 2018-05-04 DIAGNOSIS — N329 Bladder disorder, unspecified: Secondary | ICD-10-CM | POA: Diagnosis not present

## 2018-05-08 DIAGNOSIS — Z7689 Persons encountering health services in other specified circumstances: Secondary | ICD-10-CM | POA: Diagnosis not present

## 2018-05-26 ENCOUNTER — Ambulatory Visit (HOSPITAL_COMMUNITY): Payer: Medicare Other | Admitting: Physical Therapy

## 2018-05-26 ENCOUNTER — Encounter (HOSPITAL_COMMUNITY): Payer: Self-pay

## 2018-06-06 ENCOUNTER — Ambulatory Visit (HOSPITAL_COMMUNITY): Payer: Medicare Other

## 2018-06-07 ENCOUNTER — Ambulatory Visit (HOSPITAL_COMMUNITY): Payer: Medicare Other | Attending: Internal Medicine

## 2018-06-07 ENCOUNTER — Encounter (HOSPITAL_COMMUNITY): Payer: Self-pay

## 2018-06-07 ENCOUNTER — Other Ambulatory Visit: Payer: Self-pay

## 2018-06-07 DIAGNOSIS — G35 Multiple sclerosis: Secondary | ICD-10-CM | POA: Diagnosis not present

## 2018-06-07 DIAGNOSIS — R29818 Other symptoms and signs involving the nervous system: Secondary | ICD-10-CM | POA: Insufficient documentation

## 2018-06-07 DIAGNOSIS — Z7689 Persons encountering health services in other specified circumstances: Secondary | ICD-10-CM | POA: Diagnosis not present

## 2018-06-07 DIAGNOSIS — M6281 Muscle weakness (generalized): Secondary | ICD-10-CM | POA: Diagnosis not present

## 2018-06-07 DIAGNOSIS — R2689 Other abnormalities of gait and mobility: Secondary | ICD-10-CM | POA: Insufficient documentation

## 2018-06-07 NOTE — Therapy (Signed)
[facility] [address] [place], [place], [address] Phone: [phone]   Fax:  [phone]  Physical Therapy Evaluation  Patient Details  Name: Melissa West MRN: [id number] Date of Birth: [birth year] Referring Provider (PT): Benita Stabile, MD   Encounter Date: 06/07/2018  PT End of Session - 06/07/18 1030    Visit Number  1    Number of Visits  12    Date for PT Re-Evaluation  07/19/18    Authorization Type  [employer] Medicare (no auth required, no visit limit, no co-pay)    Authorization Time Period  06/07/2018 - 07/21/2018    Authorization - Visit Number  1    Authorization - Number of Visits  10    PT Start Time  1015    PT Stop Time  1120    PT Time Calculation (min)  65 min    Activity Tolerance  Patient tolerated treatment well    Behavior During Therapy  [facility] for tasks assessed/performed       Past Medical History:  Diagnosis Date  . MS (multiple sclerosis) (HCC)   . No pertinent past medical history     Past Surgical History:  Procedure Laterality Date  . CESAREAN SECTION  08/09/2011   Procedure: CESAREAN SECTION;  Surgeon: Lesly Dukes, MD;  Location: [facility];  Service: Gynecology;  Laterality: N/A;    There were no vitals filed for this visit.   Subjective Assessment - 06/07/18 1419    Subjective  Melissa West arrives in her lightweight manual wheelchair and is able to self-propel with bil UE's into the clinic. She reports she has been using a wheelchair for her mobility for 2-6 years but is unclear on the timeline at start of session. She later reports she has not been walking since having her son ~ 6 years ago and definitely had her wheelchair back then. She reports she lives at home with her 20-year old son and her mother helps her with caring for him and day to day activities such as grocery shopping. She also has an aid to assist her with mobility and transfers as well as make her meals during the day. She reports  she spends most -to-all of her day in her wheelchair and is able to stand using the wall to get over to her bed and to get to the toilet. She states Victorino Dike, her aid, is always with her for transfers except at night when she goes to bed. She reports she fell last week while transferring to the toilet in the bathroom and she rolled her Lt ankle. She states it does not hurt as badly now and is getting better.     Pertinent History  ~ 10 year history of rapidly progressing MS. Pt has been primarily a wheelchair mobilizer for ~ 6 years. She has a 81 year old son.    Limitations  Sitting;Standing;Walking;House hold activities;Other (comment)   funcitonal transfers   How long can you sit comfortably?  unlimited, sits in wheelchair most of the day    How long can you stand comfortably?  5 seconds    How long can you walk comfortably?  unable    Patient Stated Goals  Patient arrived with unachievable goals (ex: walking up flight of stairs) discussed goals to imrpove independence with transfers for safety when getting into/out of wheelchair    Currently in Pain?  Yes    Pain Score  2     Pain  Location  Ankle    Pain Orientation  Left    Pain Descriptors / Indicators  Aching    Pain Type  Acute pain    Pain Onset  In the past 7 days   pt reports she rolled her ankle last week   Pain Frequency  Occasional    Aggravating Factors   unsure    Pain Relieving Factors  it is gradually getting better        [facility] PT Assessment - 06/07/18 0001   Medical Diagnosis Multiple Sclerosis  Referring Provider (PT) Benita Stabile, MD  Onset Date/Surgical Date  (approximately 10 years ago)  Hand Dominance Left  Prior Therapy multiple bouts over the last several years, last time was in 2016  Precautions  Precautions Fall  Restrictions  Weight Bearing Restrictions No  Balance Screen  Has the patient fallen in the past 6 months Yes  How many times? at least 1x, patient deflects question stating she always has  someone with her to help (legs gave out from her while transfering to the toilet. )  Has the patient had a decrease in activity level because of a fear of falling?  Yes  Is the patient reluctant to leave their home because of a fear of falling?  No  Home Teaching laboratory technician residence  Living Arrangements Children;Other (Comment)  Available Help at Discharge Family;Personal care attendant  Type of Home House  Home Layout One level  Home Equipment Wheelchair - manual;Walker - 2 wheels;Walker - 4 wheels;Tub bench  Additional Comments Pt has a nurse/aid who assists her 7x/week who arrives prior to breakfast and leaves between lunch and dinner. Patient reports her mother helps her as well and goes food shopping for her. She has asked for a scooter from her MD as she feels this would be easier to use in the community.   Prior Function  Level of Independence Needs assistance with transfers;Needs assistance with homemaking;Needs assistance with ADLs  Cognition  Overall Cognitive Status Within Functional Limits for tasks assessed (difficult to assess memory, pt scattered in thought)  Posture/Postural Control  Posture Comments Pt leanign Rt sitting in wheelchair  Tone  Assessment Location RLE;LLE  ROM / Strength  AROM / PROM / Strength Strength  Strength  Strength Assessment Site Hip;Knee;Ankle  Right/Left Knee Right;Left  Right Hip Flexion 1/5  Left Hip Flexion 1/5  Left Hip Extension 1/5  Right Hip Extension 1/5  Right Hip ABduction 2-/5  Right Hip ADduction 2/5  Left Hip ABduction 1/5  Left Hip ADduction 2-/5  Bed Mobility  Bed Mobility Rolling Right;Rolling Left;Left Sidelying to Sit;Sit to Supine  Rolling Right Moderate Assistance - Patient 50-74%  Rolling Left Moderate Assistance - Patient 50-74%  Left Sidelying to Sit Moderate Assistance - Patient 50-74%  Sit to Supine Moderate Assistance - Patient 50-74%  Transfers  Transfers Sit to Stand;Stand to Sit;Stand  Pivot Transfers;Squat Pivot Transfers  Sit to Stand 3: Mod assist;With upper extremity assist  Sit to Stand Details Verbal cues for sequencing;Verbal cues for technique;Verbal cues for precautions/safety;Manual facilitation for weight shifting  Stand to Sit 4: Min assist;Uncontrolled descent;With upper extremity assist  Stand to Sit Details (indicate cue type and reason) Verbal cues for sequencing;Verbal cues for technique;Verbal cues for precautions/safety  Stand Pivot Transfers 3: Mod assist;With armrests  Stand Pivot Transfer Details (indicate cue type and reason) With RW. Pt required verbal cues for safety/sequencing and manual facilitation to initiate stand and turn.   Squat  Pivot Transfers 3: Mod assist;With upper extremity assistance;With armrests  Squat Pivot Transfer Details (indicate cue type and reason) Patient requires cues for hand placement to guide/direct transfer and cues to sequence head/hip relationship to initiate squat elevation and direct hips towards destination. Manual faciliatiton provided to initiate elevation and pivot.   Comments Pt unable to sequence or direct transfer independently.  Ambulation/Gait  Ambulation/Gait No  Pre-Gait Activities Patient asked to march with Bil UE support on RW. Pt attemtped weight shift but unable to clear bil feet from floor.  Gait Comments Patient unable to weight shift effectively to march in place with RW for support and could not tolerate standing for > 5 seconds therefore not attempted for safety concerns.   Engineer, site Both upper extremities  Wheelchair Parts Management Supervision/cueing (pt requires cues to lock brakes for transfers)  Distance 60 feet (twice)  RLE Tone  RLE Tone Modified Ashworth  RLE Tone  Modified Ashworth Scale for Grading Hypertonia RLE 2 (quadriceps = 3, hamstring =2)  LLE Tone  LLE Tone Modified Ashworth  LLE Tone  Modified Ashworth Scale for  Grading Hypertonia LLE 2 (quadriceps = 3, hamstring =2)     Objective measurements completed on examination: See above findings.      PT Education - 06/07/18 1120    Education Details  Educated on progressive pattern of MS for weakness and that therapy goals should be based on maintaining current strength and improving independence. Patient educated on benefits of aquatic therapy to work on balance and strengthening in a gravity reduced environment.    Person(s) Educated  Patient    Methods  Explanation    Comprehension  Verbalized understanding       PT Short Term Goals - 06/07/18 1435      PT SHORT TERM GOAL #1   Title  Patient will tolerate full stance with RW for 30 seconds, followed by safe sequencing of decent to chair with UE use    Time  3    Period  Weeks    Status  New    Target Date  06/28/18      PT SHORT TERM GOAL #2   Title  Patient will perform squat pivot transfers with min assist to demosntrate improved ability to initiate transfer.     Time  3    Period  Weeks    Status  New      PT SHORT TERM GOAL #3   Title  Patient will demonstrate correct form with HEP, updated PRN, to improve funcitonal strength and reduce tone for improved flexibility.     Time  3    Period  Weeks    Status  New        PT Long Term Goals - 06/07/18 1441      PT LONG TERM GOAL #1   Title  Patient will perform stand pivot transfer with min assist to demonstrate improved funcitonal strength and mobility to assist with wheelchair <> toilet transfers.     Time  6    Period  Weeks    Status  New    Target Date  07/19/18      PT LONG TERM GOAL #2   Title  Patient will demonstrate independence with bed mobility to improve safety with going to bed each evening.     Time  6    Period  Weeks    Status  New  PT LONG TERM GOAL #3   Title  Patient will perofrm squat pivot transfer with supervision up to min assist while instructing caregiver/aid on how to help her during  transfer.     Time  6    Period  Weeks    Status  New      PT LONG TERM GOAL #4   Title  Patient will tolerate full stance with RW for 1 minute, followed by safe sequencing of decent to chair with UE use to demonstrate improved endurance to perform hygeine tasks in standing.    Time  6    Period  Weeks    Status  New        Plan - 06/07/18 1120    Clinical Impression Statement  Ms. Brach has been seen previously in our clinic for balance and strength training related to her multiple sclerosis. She was last seen in our office in 2016 and at that time was primarily a wheelchair mobilizer. Ms. Pebley was diagnosed with MS approximately 10 years ago and it has progressed rapidly. Patient was able to ambulate ~6 feet with assistance using RW in 2016 but was not ambulating greater distances and required assistance for standing transfers. Patient arrived with written goals for therapy to be able to walk, go up/down stairs, and drive again. She reports it has been ~ 6-7 years since she walked without assistance for short distances. Education was provided on progressive process of MS and discussion to develop achievable goals to maintain/maximize functional independence. She currently requires moderate assistance for sit to stand, stand pivot transfers, sit <>supine, and bed mobility. Ms. Oliveto required verbal cuing for sequencing all transfers/mobility safely. She demonstrated the ability to stand with RW and mod assist for 5 seconds before collapsing back to sit. She was unable to effectively weight shift to lift either LE up to march while standing. Ms. Guerriero demonstrates positive UMN testing consistent with her diagnosis and weakness throughout bil LE's. She has notable tone in bil hamstrings and quadriceps. She demonstrated good balance in seated position to don socks and shoes, but reports feeling fearful of leaning forward to do so. Ms. Harker will benefit from skilled PT interventions to  address impairments and optimize functional independence with mobility to improve QOL.    Clinical Presentation  Stable    Clinical Presentation due to:  UMN testing, MMT, transfers and bed mobility, clinical judgement    Clinical Decision Making  Moderate    Rehab Potential  Fair    PT Frequency  2x / week    PT Duration  6 weeks    PT Treatment/Interventions  ADLs/Self Care Home Management;Aquatic Therapy;Biofeedback;Cryotherapy;Electrical Stimulation;Moist Heat;Iontophoresis 4mg /ml Dexamethasone;DME Instruction;Gait training;Stair training;Functional mobility training;Therapeutic activities;Therapeutic exercise;Balance training;Neuromuscular re-education;Patient/family education;Manual techniques;Passive range of motion;Energy conservation;Spinal Manipulations;Joint Manipulations    PT Next Visit Plan  Review Eval and goals. Tighten wheelchair brakes. Complete knee and ankle MMT. Research [employer] program for patient. Initiate aquatic exercises for balance and gait, initiate isometric exercises for LE strengthening. Perform squat pivot transfer training with transfer board.    Consulted and Agree with Plan of Care  Patient       Patient will benefit from skilled therapeutic intervention in order to improve the following deficits and impairments:  Abnormal gait, Decreased activity tolerance, Decreased endurance, Decreased range of motion, Decreased strength, Impaired perceived functional ability, Decreased balance, Decreased coordination, Decreased knowledge of precautions, Decreased mobility, Decreased safety awareness, Difficulty walking, Impaired tone, Postural dysfunction, Obesity, Decreased knowledge of use  of DME  Visit Diagnosis: Multiple sclerosis (HCC)  Other abnormalities of gait and mobility  Muscle weakness (generalized)  Other symptoms and signs involving the nervous system     Problem List Patient Active Problem List   Diagnosis Date Noted  . MS (multiple sclerosis) (HCC)    . Neurological deficit present 11/11/2010    Valentino Saxon, PT, DPT, [facility] Physical Therapist with [facility]  06/07/2018 12:14 PM    [place] [facility] [address] [place], [place], [address] Phone: [phone]   Fax:  [phone]  Name: Jerri Devilbiss MRN: [id number] Date of Birth: 1990/12/16

## 2018-06-08 ENCOUNTER — Encounter (HOSPITAL_COMMUNITY): Payer: Self-pay

## 2018-06-13 ENCOUNTER — Ambulatory Visit (HOSPITAL_COMMUNITY): Payer: Medicare Other | Attending: Internal Medicine

## 2018-06-13 DIAGNOSIS — R29818 Other symptoms and signs involving the nervous system: Secondary | ICD-10-CM | POA: Diagnosis not present

## 2018-06-13 DIAGNOSIS — M6281 Muscle weakness (generalized): Secondary | ICD-10-CM | POA: Insufficient documentation

## 2018-06-13 DIAGNOSIS — R2689 Other abnormalities of gait and mobility: Secondary | ICD-10-CM | POA: Insufficient documentation

## 2018-06-13 DIAGNOSIS — G35 Multiple sclerosis: Secondary | ICD-10-CM | POA: Diagnosis not present

## 2018-06-14 ENCOUNTER — Encounter (HOSPITAL_COMMUNITY): Payer: Self-pay

## 2018-06-14 ENCOUNTER — Other Ambulatory Visit: Payer: Self-pay

## 2018-06-14 NOTE — Therapy (Signed)
[facility] [address] [place], [place], [address] Phone: [phone]   Fax:  [phone]  Physical Therapy Aquatic Treatment  Patient Details  Name: Melissa West MRN: [id number] Date of Birth: [birth year] Referring Provider (PT): Benita Stabile, MD   Encounter Date: 06/13/2018  PT End of Session - 06/13/18 1350    Visit Number  1    Number of Visits  12    Date for PT Re-Evaluation  07/19/18    Authorization Type  [employer] Medicare (no auth required, no visit limit, no co-pay)    Authorization Time Period  06/07/2018 - 07/21/2018    Authorization - Visit Number  1    Authorization - Number of Visits  10    PT Start Time  1310    PT Stop Time  1350    PT Time Calculation (min)  40 min    Activity Tolerance  Patient tolerated treatment well    Behavior During Therapy  [facility] for tasks assessed/performed       Past Medical History:  Diagnosis Date  . MS (multiple sclerosis) (HCC)   . No pertinent past medical history     Past Surgical History:  Procedure Laterality Date  . CESAREAN SECTION  08/09/2011   Procedure: CESAREAN SECTION;  Surgeon: Lesly Dukes, MD;  Location: [facility];  Service: Gynecology;  Laterality: N/A;    There were no vitals filed for this visit.  Subjective Assessment - 06/13/18 1350    Subjective  Patient arrives with her mother and nephew. She reports she is late because she was filling out forms for her [employer] membership thorugh silver sneakers with her insurance card. She denies pain.    Pertinent History  ~ 10 year history of rapidly progressing MS. Pt has been primarily a wheelchair mobilizer for ~ 6 years. She has a 84 year old son.    Limitations  Sitting;Standing;Walking;House hold activities;Other (comment)   funcitonal transfers   How long can you sit comfortably?  unlimited, sits in wheelchair most of the day    How long can you stand comfortably?  5 seconds    How long can you walk comfortably?  unable     Patient Stated Goals  Patient arrived with unachievable goals (ex: walking up flight of stairs) discussed goals to imrpove independence with transfers for safety when getting into/out of wheelchair    Currently in Pain?  No/denies    Pain Onset  --   pt reports she rolled her ankle last week       [facility] Adult Treatment/Exercise 06/13/18 1350  Transfers  Squat Pivot Transfers 3: Mod assist;With upper extremity assistance;With armrests  Squat Pivot Transfer Details (indicate cue type and reason) Squat pivot transfer performed w/c<>pool lift chair. Patient requires verbal/visual cues for hand placement and sequencing head/hip position. Cues required for timing and manual assist at ischial tuberosity to initiate lift/stand and pivot.   Comments Pt required moderate assist to scot posteriorly in pool lift chair due to increased extensor tone in bil LE'screating a tendency for her to slide forward.     Adult Aquatic Therapy - 06/13/18 1350      Aquatic Therapy Subjective   Subjective  Patient arrives with her mother and nephew. She reports she is late because she was filling out forms for her [employer] membership thorugh silver sneakers with her insurance card. She denies pain.      Treatment   Gait  Lateral stepping 2x 10'  RT with Bil UE support and min assist for balance and trunk control. 2x 5' forward gait with moderate assist to maintain balance and lower trunk control and advnace bil LE's. Patient unable to unlock knees and walking on toes due to increased extensor tone.     Exercises  1x 15 reps bil LE hip abduction. 1x 15 reps bil LE hip extension. 1x 15 reps bil heel raises with 3 sec holds. Attempted marching and transitioned to hip flexion kick as pt unable to unlockknee. 1x 15 reps bil LE with min assist to prevent contralateral LE from lifting off pool floor.     Specific Exercises  Hip/Low Back    Hip/Low Back  Seated on platform: quadriceps stretch with overpressure from therapist, 3x  30 seconds bil LE. Pt attempting active knee flexion in sitting (successful 50% of time) required cues to deter kne extension and encourage knee flexion.         PT Education - 06/13/18 1350    Education Details  Educated on transfer technique with chair lift for pool. Edcuated on pool safety with exercises and on purpose of exercises throughuot.    Person(s) Educated  Patient    Methods  Explanation;Verbal cues;Tactile cues    Comprehension  Verbalized understanding;Returned demonstration       PT Short Term Goals - 06/07/18 1435      PT SHORT TERM GOAL #1   Title  Patient will tolerate full stance with RW for 30 seconds, followed by safe sequencing of decent to chair with UE use    Time  3    Period  Weeks    Status  New    Target Date  06/28/18      PT SHORT TERM GOAL #2   Title  Patient will perform squat pivot transfers with min assist to demosntrate improved ability to initiate transfer.     Time  3    Period  Weeks    Status  New      PT SHORT TERM GOAL #3   Title  Patient will demonstrate correct form with HEP, updated PRN, to improve funcitonal strength and reduce tone for improved flexibility.     Time  3    Period  Weeks    Status  New        PT Long Term Goals - 06/07/18 1441      PT LONG TERM GOAL #1   Title  Patient will perform stand pivot transfer with min assist to demonstrate improved funcitonal strength and mobility to assist with wheelchair <> toilet transfers.     Time  6    Period  Weeks    Status  New    Target Date  07/19/18      PT LONG TERM GOAL #2   Title  Patient will demonstrate independence with bed mobility to improve safety with going to bed each evening.     Time  6    Period  Weeks    Status  New      PT LONG TERM GOAL #3   Title  Patient will perofrm squat pivot transfer with supervision up to min assist while instructing caregiver/aid on how to help her during transfer.     Time  6    Period  Weeks    Status  New      PT  LONG TERM GOAL #4   Title  Patient will tolerate full stance with RW for 1 minute, followed  by safe sequencing of decent to chair with UE use to demonstrate improved endurance to perform hygeine tasks in standing.    Time  6    Period  Weeks    Status  New         Plan - 06/13/18 1350    Clinical Impression Statement  Patient initiated aquatic therapy this session. Patient requires moderate assist for squat pivot transfers to move form wheelchair to pool lift chair. She required verbal/visual cues to sequence and time transfer. Patient presented with increased extensor tone in Bil LE's requiring up to moderate assistance throughout session to maintain balance as patient had limited lower trunk control. She was able to complete side stepping in the water but unable to lower onto flat foot position and remained on toes throughout session. In seated position on platform therapist was able to stretch bil quadricpes and patient was able to activate hamstrings following through small ROM. Patient will continue to benefit from skilled PT interventions to address impairments and progress towards goals.     Rehab Potential  Fair    PT Frequency  2x / week    PT Duration  6 weeks    PT Treatment/Interventions  ADLs/Self Care Home Management;Aquatic Therapy;Biofeedback;Cryotherapy;Electrical Stimulation;Moist Heat;Iontophoresis 4mg /ml Dexamethasone;DME Instruction;Gait training;Stair training;Functional mobility training;Therapeutic activities;Therapeutic exercise;Balance training;Neuromuscular re-education;Patient/family education;Manual techniques;Passive range of motion;Energy conservation;Spinal Manipulations;Joint Manipulations    PT Next Visit Plan  Review Eval and goals. Tighten wheelchair brakes. Complete knee and ankle MMT. Research [employer] program for patient. Continue aquatic exercises for balance and gait, initiate isometric exercises for LE strengthening. Perform squat pivot transfer training with  transfer board to increase pt independence.     Consulted and Agree with Plan of Care  Patient       Patient will benefit from skilled therapeutic intervention in order to improve the following deficits and impairments:  Abnormal gait, Decreased activity tolerance, Decreased endurance, Decreased range of motion, Decreased strength, Impaired perceived functional ability, Decreased balance, Decreased coordination, Decreased knowledge of precautions, Decreased mobility, Decreased safety awareness, Difficulty walking, Impaired tone, Postural dysfunction, Obesity, Decreased knowledge of use of DME  Visit Diagnosis: Multiple sclerosis (HCC)  Other abnormalities of gait and mobility  Muscle weakness (generalized)  Other symptoms and signs involving the nervous system     Problem List Patient Active Problem List   Diagnosis Date Noted  . MS (multiple sclerosis) (HCC)   . Neurological deficit present 11/11/2010    Valentino Saxon, PT, DPT, [facility] Physical Therapist with [facility]  06/14/2018 3:37 PM    [facility] [address] [place], [place], [address] Phone: [phone]   Fax:  [phone]  Name: Porsche Santell MRN: [id number] Date of Birth: [birth year]

## 2018-06-15 ENCOUNTER — Telehealth (HOSPITAL_COMMUNITY): Payer: Self-pay | Admitting: Internal Medicine

## 2018-06-15 ENCOUNTER — Ambulatory Visit (HOSPITAL_COMMUNITY): Payer: Medicare Other

## 2018-06-15 ENCOUNTER — Telehealth (HOSPITAL_COMMUNITY): Payer: Self-pay

## 2018-06-15 NOTE — Telephone Encounter (Signed)
06/15/18  pt called to cx said that she was having family problems

## 2018-06-15 NOTE — Telephone Encounter (Signed)
L/m to go over schedule and remind her of Thurs apptment at 10:30 and AQ starting on Tuesday 06/27/2018 @ 1pm with Rachedl. NF 06/15/2018

## 2018-06-20 ENCOUNTER — Ambulatory Visit (HOSPITAL_COMMUNITY): Payer: Medicare Other

## 2018-06-20 ENCOUNTER — Other Ambulatory Visit: Payer: Self-pay

## 2018-06-20 ENCOUNTER — Encounter (HOSPITAL_COMMUNITY): Payer: Self-pay

## 2018-06-20 DIAGNOSIS — M6281 Muscle weakness (generalized): Secondary | ICD-10-CM

## 2018-06-20 DIAGNOSIS — R2689 Other abnormalities of gait and mobility: Secondary | ICD-10-CM

## 2018-06-20 DIAGNOSIS — G35D Multiple sclerosis, unspecified: Secondary | ICD-10-CM

## 2018-06-20 DIAGNOSIS — G35 Multiple sclerosis: Secondary | ICD-10-CM

## 2018-06-20 DIAGNOSIS — R29818 Other symptoms and signs involving the nervous system: Secondary | ICD-10-CM | POA: Diagnosis not present

## 2018-06-20 NOTE — Therapy (Signed)
[facility] [address] [place], [place], [address] Phone: [phone]   Fax:  [phone]  Physical Therapy Aquatic Treatment  Patient Details  Name: Melissa West MRN: [id number] Date of Birth: December 23, 1990 Referring Provider (PT): Benita Stabile, MD   Encounter Date: 06/20/2018  PT End of Session - 06/20/18 1522    Visit Number  3    Number of Visits  12    Date for PT Re-Evaluation  07/19/18    Authorization Type  [employer] Medicare (no auth required, no visit limit, no co-pay)    Authorization Time Period  06/07/2018 - 07/21/2018    Authorization - Visit Number  3    Authorization - Number of Visits  10    PT Start Time  1306    PT Stop Time  1346    PT Time Calculation (min)  40 min    Activity Tolerance  Patient tolerated treatment well    Behavior During Therapy  [facility] for tasks assessed/performed       Past Medical History:  Diagnosis Date  . MS (multiple sclerosis) (HCC)   . No pertinent past medical history     Past Surgical History:  Procedure Laterality Date  . CESAREAN SECTION  08/09/2011   Procedure: CESAREAN SECTION;  Surgeon: Lesly Dukes, MD;  Location: [facility];  Service: Gynecology;  Laterality: N/A;    There were no vitals filed for this visit.  Subjective Assessment - 06/20/18 1514    Subjective  Patient arrives at side entrance to [facility]. She reports she is feeling good and asks about using a brace or weight to keep her feet down as she saw something like that in a youtube video.     Pertinent History  ~ 10 year history of rapidly progressing MS. Pt has been primarily a wheelchair mobilizer for ~ 6 years. She has a 30 year old son.    Limitations  Sitting;Standing;Walking;House hold activities;Other (comment)   funcitonal transfers   How long can you sit comfortably?  unlimited, sits in wheelchair most of the day    How long can you stand comfortably?  5 seconds    How long can you walk comfortably?  unable    Patient Stated Goals  Patient arrived with unachievable goals (ex: walking up flight of stairs) discussed goals to imrpove independence with transfers for safety when getting into/out of wheelchair    Currently in Pain?  No/denies    Pain Onset  --   pt reports she rolled her ankle last week       [facility] Adult PT Treatment/Exercise - 06/20/18 0001      Transfers   Squat Pivot Transfers  3: Mod assist;With upper extremity assistance;With armrests    Squat Pivot Transfer Details (indicate cue type and reason)  Squat pivot transfer performed w/c<>pool lift chair. Patient requires verbal/visual cues for hand placement and sequencing head/hip position. Cues required for timing and manual assist at ischial tuberosity to initiate lift/stand and pivot.    Comments  Pt required moderate assist to scoot posteriorly in pool lift chair due to increased extensor tone in bil LE'screating a tendency for her to slide forward. She had reduce tone after session when transfering back from pool chair lift to wheelchair.        Adult Aquatic Therapy - 06/20/18 1515      Aquatic Therapy Subjective   Subjective  Patient arrives at side entrance to [facility]. She reports she is  feeling good and asks about using a brace or weight to keep her feet down as she saw something like that in a youtube video.       Treatment   Exercises  Seated on platform: quadriceps stretch with overpressure from therapist, 4x 30 seconds bil LE. 2x 10 reps bil LE hip adduction/abduction (assistance from therapist for Lt hip abduction and Rt hip adduction). 2x 8 reps bil LE hip extension. 2x 8 reps bil LE hip flexion.     Specific Exercises  Hip/Low Back    Hip/Low Back  Supine with mod assist to support pt at upper trunk: 2x 10 reps hip abd/add combined. 2x 10 reps flutter kicks vertically (hip ext/flex).    cues for post pelvic tilt/abd activation too lift hips up       PT Education - 06/20/18 1522    Education Details  Educated on plan  to use weigths to help keep LE's down in chair and on potential benefit of medication to manage LE spasticity.    Person(s) Educated  Patient    Methods  Explanation    Comprehension  Verbalized understanding       PT Short Term Goals - 06/07/18 1435      PT SHORT TERM GOAL #1   Title  Patient will tolerate full stance with RW for 30 seconds, followed by safe sequencing of decent to chair with UE use    Time  3    Period  Weeks    Status  New    Target Date  06/28/18      PT SHORT TERM GOAL #2   Title  Patient will perform squat pivot transfers with min assist to demosntrate improved ability to initiate transfer.     Time  3    Period  Weeks    Status  New      PT SHORT TERM GOAL #3   Title  Patient will demonstrate correct form with HEP, updated PRN, to improve funcitonal strength and reduce tone for improved flexibility.     Time  3    Period  Weeks    Status  New        PT Long Term Goals - 06/07/18 1441      PT LONG TERM GOAL #1   Title  Patient will perform stand pivot transfer with min assist to demonstrate improved funcitonal strength and mobility to assist with wheelchair <> toilet transfers.     Time  6    Period  Weeks    Status  New    Target Date  07/19/18      PT LONG TERM GOAL #2   Title  Patient will demonstrate independence with bed mobility to improve safety with going to bed each evening.     Time  6    Period  Weeks    Status  New      PT LONG TERM GOAL #3   Title  Patient will perofrm squat pivot transfer with supervision up to min assist while instructing caregiver/aid on how to help her during transfer.     Time  6    Period  Weeks    Status  New      PT LONG TERM GOAL #4   Title  Patient will tolerate full stance with RW for 1 minute, followed by safe sequencing of decent to chair with UE use to demonstrate improved endurance to perform hygeine tasks in standing.    Time  6    Period  Weeks    Status  New        Plan - 06/20/18 1523     Clinical Impression Statement  Continued with aquatic therapy session today and added ankle weights to bil LE's to assist with keeping patient's legs down. Continued with squat pivot transfers today wheelchair<>pool lift chair and patient required moderate assistance each time. While transferring after therapy patient had notable decrease in LE tone and improve knee flexion to position for transfer; however she was fatigued and this limited her ability to initiate the lift for buttock clearance. Session began with LE stretching in sitting and patient initiated supine AROM exercise for bil LE and standing hip ext/flex. She demonstrated greater ROM into flexion this session compared to last. Patient will continue to benefit from skilled PT interventions to address impairments and progress towards goals.     Rehab Potential  Fair    PT Frequency  2x / week    PT Duration  6 weeks    PT Treatment/Interventions  ADLs/Self Care Home Management;Aquatic Therapy;Biofeedback;Cryotherapy;Electrical Stimulation;Moist Heat;Iontophoresis 4mg /ml Dexamethasone;DME Instruction;Gait training;Stair training;Functional mobility training;Therapeutic activities;Therapeutic exercise;Balance training;Neuromuscular re-education;Patient/family education;Manual techniques;Passive range of motion;Energy conservation;Spinal Manipulations;Joint Manipulations    PT Next Visit Plan  Tighten wheelchair brakes. Complete knee and ankle MMT. Continue aquatic exercises for balance and gait, initiate isometric exercises for LE strengthening. Perform squat pivot transfer training with transfer board to increase pt independence.     Consulted and Agree with Plan of Care  Patient       Patient will benefit from skilled therapeutic intervention in order to improve the following deficits and impairments:  Abnormal gait, Decreased activity tolerance, Decreased endurance, Decreased range of motion, Decreased strength, Impaired perceived functional  ability, Decreased balance, Decreased coordination, Decreased knowledge of precautions, Decreased mobility, Decreased safety awareness, Difficulty walking, Impaired tone, Postural dysfunction, Obesity, Decreased knowledge of use of DME  Visit Diagnosis: Multiple sclerosis (HCC)  Other abnormalities of gait and mobility  Muscle weakness (generalized)  Other symptoms and signs involving the nervous system     Problem List Patient Active Problem List   Diagnosis Date Noted  . MS (multiple sclerosis) (HCC)   . Neurological deficit present 11/11/2010    Valentino Saxon, PT, DPT, [facility] Physical Therapist with [facility]  06/20/2018 3:53 PM    [facility] [address] [place], [place], [address] Phone: [phone]   Fax:  [phone]  Name: Mckell Sciascia MRN: [id number] Date of Birth: 03-Aug-1990

## 2018-06-22 ENCOUNTER — Encounter (HOSPITAL_COMMUNITY): Payer: Self-pay

## 2018-06-22 ENCOUNTER — Other Ambulatory Visit: Payer: Self-pay

## 2018-06-22 ENCOUNTER — Telehealth (HOSPITAL_COMMUNITY): Payer: Self-pay

## 2018-06-22 ENCOUNTER — Ambulatory Visit (HOSPITAL_COMMUNITY): Payer: Medicare Other

## 2018-06-22 DIAGNOSIS — R29818 Other symptoms and signs involving the nervous system: Secondary | ICD-10-CM | POA: Diagnosis not present

## 2018-06-22 DIAGNOSIS — G35 Multiple sclerosis: Secondary | ICD-10-CM | POA: Diagnosis not present

## 2018-06-22 DIAGNOSIS — M6281 Muscle weakness (generalized): Secondary | ICD-10-CM | POA: Diagnosis not present

## 2018-06-22 DIAGNOSIS — G35D Multiple sclerosis, unspecified: Secondary | ICD-10-CM

## 2018-06-22 DIAGNOSIS — R2689 Other abnormalities of gait and mobility: Secondary | ICD-10-CM

## 2018-06-22 NOTE — Therapy (Signed)
[facility] [address] [place], [place], [address] Phone: [phone]   Fax:  [phone]  Physical Therapy Treatment  Patient Details  Name: Melissa West MRN: [id number] Date of Birth: Feb 27, 1991 Referring Provider (PT): Benita Stabile, MD   Encounter Date: 06/22/2018  PT End of Session - 06/22/18 1029    Visit Number  4    Number of Visits  12    Date for PT Re-Evaluation  07/19/18   Minireassess 06/28/18   Authorization Type  [employer] Medicare (no auth required, no visit limit, no co-pay)    Authorization Time Period  06/07/2018 - 07/21/2018    Authorization - Visit Number  4    Authorization - Number of Visits  10    PT Start Time  1024    PT Stop Time  1110    PT Time Calculation (min)  46 min    Equipment Utilized During Treatment  Gait belt    Activity Tolerance  Patient tolerated treatment well;Patient limited by fatigue   fatigue level at 5/10      Past Medical History:  Diagnosis Date  . MS (multiple sclerosis) (HCC)   . No pertinent past medical history     Past Surgical History:  Procedure Laterality Date  . CESAREAN SECTION  08/09/2011   Procedure: CESAREAN SECTION;  Surgeon: Lesly Dukes, MD;  Location: [facility];  Service: Gynecology;  Laterality: N/A;    There were no vitals filed for this visit.  Subjective Assessment - 06/22/18 1025    Subjective  No reports of pain today.  Reports brother tightened brakes on WC.  No reports of pain today.  Stated she brought video her son recorded of her transfering to bed from [facility].    Pertinent History  ~ 30 year history of rapidly progressing MS. Pt has been primarily a wheelchair mobilizer for ~ 6 years. She has a 30 year old son.    Patient Stated Goals  Patient arrived with unachievable goals (ex: walking up flight of stairs) discussed goals to imrpove independence with transfers for safety when getting into/out of wheelchair    Currently in Pain?  No/denies          [facility] PT Assessment - 06/22/18 0001      Assessment   Medical Diagnosis  Multiple Sclerosis    Referring Provider (PT)  Benita Stabile, MD    Hand Dominance  Left    Prior Therapy  multiple bouts over the last several years, last time was in 2016      Strength   Right Knee Flexion  2-/5    Right Knee Extension  2-/5    Left Knee Flexion  1/5    Left Knee Extension  2-/5    Right Ankle Dorsiflexion  2-/5    Left Ankle Dorsiflexion  1/5                   [facility] Adult PT Treatment/Exercise - 06/22/18 0001      Bed Mobility   Bed Mobility  Rolling Right;Rolling Left;Left Sidelying to Sit;Sit to Supine    Rolling Right  Moderate Assistance - Patient 50-74%    Rolling Left  Moderate Assistance - Patient 50-74%    Left Sidelying to Sit  Moderate Assistance - Patient 50-74%    Sit to Supine  Moderate Assistance - Patient 50-74%      Transfers   Squat Pivot Transfers  3: Mod assist;With upper  extremity assistance;With armrests      Exercises   Exercises  Knee/Hip      Knee/Hip Exercises: Seated   Other Seated Knee/Hip Exercises  glut sets    Other Seated Knee/Hip Exercises  hamstrings    Abduction/Adduction   Both;10 reps    Abd/Adduction Limitations  manual resistance isometric               PT Short Term Goals - 06/07/18 1435      PT SHORT TERM GOAL #1   Title  Patient will tolerate full stance with RW for 30 seconds, followed by safe sequencing of decent to chair with UE use    Time  3    Period  Weeks    Status  New    Target Date  06/28/18      PT SHORT TERM GOAL #2   Title  Patient will perform squat pivot transfers with min assist to demosntrate improved ability to initiate transfer.     Time  3    Period  Weeks    Status  New      PT SHORT TERM GOAL #3   Title  Patient will demonstrate correct form with HEP, updated PRN, to improve funcitonal strength and reduce tone for improved flexibility.     Time  3    Period  Weeks    Status   New        PT Long Term Goals - 06/07/18 1441      PT LONG TERM GOAL #1   Title  Patient will perform stand pivot transfer with min assist to demonstrate improved funcitonal strength and mobility to assist with wheelchair <> toilet transfers.     Time  6    Period  Weeks    Status  New    Target Date  07/19/18      PT LONG TERM GOAL #2   Title  Patient will demonstrate independence with bed mobility to improve safety with going to bed each evening.     Time  6    Period  Weeks    Status  New      PT LONG TERM GOAL #3   Title  Patient will perofrm squat pivot transfer with supervision up to min assist while instructing caregiver/aid on how to help her during transfer.     Time  6    Period  Weeks    Status  New      PT LONG TERM GOAL #4   Title  Patient will tolerate full stance with RW for 1 minute, followed by safe sequencing of decent to chair with UE use to demonstrate improved endurance to perform hygeine tasks in standing.    Time  6    Period  Weeks    Status  New            Plan - 06/22/18 1240    Clinical Impression Statement  Pt reports her brother tightened her brake, reports improved safety with transfers.  Began session reviewing goals and answering questions related to exercises and benefits with therapy.  MMT complete for quad, hamstrings and dorsiflexors with muscle contraction palpated but minimal movements.  Pt brough video of her transfering to bed, pt SPT and sits on bed to supine then relies on UE to pull on headboard noted bending headboard forward with UE force.  Pt instructed log rolling techniques to improve mechanics with bed mobility, assistance required with LE due to weakness.  A trapeze bar installed in bedroom may be beneficiial to assist wiht bed mobiltiy.  Transfer training wiht cueing for hand placement and over mechanics for safety.  Added isometric exercises for gluteal strengthening with printout given to pt.  Pt stated fatigue level 5/10  through session, no reoprts of pain through session.      Rehab Potential  Fair    PT Frequency  2x / week    PT Duration  6 weeks    PT Treatment/Interventions  ADLs/Self Care Home Management;Aquatic Therapy;Biofeedback;Cryotherapy;Electrical Stimulation;Moist Heat;Iontophoresis 4mg /ml Dexamethasone;DME Instruction;Gait training;Stair training;Functional mobility training;Therapeutic activities;Therapeutic exercise;Balance training;Neuromuscular re-education;Patient/family education;Manual techniques;Passive range of motion;Energy conservation;Spinal Manipulations;Joint Manipulations    PT Next Visit Plan  Continue aquatic exercises for balance and gait, initiate isometric exercises for LE strengthening. Perform squat pivot transfer training with transfer board to increase pt independence. Continued bed mobility transfer safety and possible addition of trapeze bar in bedroom to assist.      PT Home Exercise Plan  3/12: isometric gluteal and hamstrings in seated position       Patient will benefit from skilled therapeutic intervention in order to improve the following deficits and impairments:  Abnormal gait, Decreased activity tolerance, Decreased endurance, Decreased range of motion, Decreased strength, Impaired perceived functional ability, Decreased balance, Decreased coordination, Decreased knowledge of precautions, Decreased mobility, Decreased safety awareness, Difficulty walking, Impaired tone, Postural dysfunction, Obesity, Decreased knowledge of use of DME  Visit Diagnosis: Multiple sclerosis (HCC)  Other abnormalities of gait and mobility  Muscle weakness (generalized)  Other symptoms and signs involving the nervous system     Problem List Patient Active Problem List   Diagnosis Date Noted  . MS (multiple sclerosis) (HCC)   . Neurological deficit present 11/11/2010   Becky Sax, LPTA; CBIS [phone]  Juel Burrow 06/22/2018, 12:50 PM   [facility] [address] [place], [place], [address] Phone: [phone]   Fax:  [phone]  Name: Melissa West MRN: [id number] Date of Birth: 24-Jul-1990

## 2018-06-22 NOTE — Telephone Encounter (Signed)
Pt called wanted to know if Fleet Contras had called her MD -Fleet Contras states she and the patient will talke about that at their next AQ treatment. NF 06/22/2018.

## 2018-06-26 ENCOUNTER — Telehealth (HOSPITAL_COMMUNITY): Payer: Self-pay | Admitting: Internal Medicine

## 2018-06-26 NOTE — Telephone Encounter (Signed)
06/26/18  Spoke with patient about coming here to the office instead of going to the [facility] [employer] [facility]

## 2018-06-27 ENCOUNTER — Ambulatory Visit (HOSPITAL_COMMUNITY): Payer: Medicare Other

## 2018-06-27 ENCOUNTER — Telehealth (HOSPITAL_COMMUNITY): Payer: Self-pay

## 2018-06-27 NOTE — Telephone Encounter (Signed)
She called stating she has a child at home and will not be in this week

## 2018-06-29 ENCOUNTER — Ambulatory Visit (HOSPITAL_COMMUNITY): Payer: Medicare Other

## 2018-06-30 ENCOUNTER — Telehealth (HOSPITAL_COMMUNITY): Payer: Self-pay

## 2018-06-30 NOTE — Telephone Encounter (Signed)
I called Ms. Chiang and left her a message which she then immediately called back regarding. I informed her our office is closing for 2 weeks for the wellbeing and safety of our patients and staff and that we will be tentatively re-opening on 07/17/18. She was agreeable to a weekly call to check in with her if she would like this and she would like an updated HEP. She does not have an email but confirmed her mailing address and we will send her updated exercises in the mail.  Valentino Saxon, PT, DPT, [facility] Physical Therapist with [facility]  06/30/2018 1:52 PM

## 2018-07-04 ENCOUNTER — Ambulatory Visit (HOSPITAL_COMMUNITY): Payer: Medicare Other

## 2018-07-06 ENCOUNTER — Ambulatory Visit (HOSPITAL_COMMUNITY): Payer: Medicare Other

## 2018-07-07 ENCOUNTER — Telehealth (HOSPITAL_COMMUNITY): Payer: Self-pay | Admitting: Physical Therapy

## 2018-07-07 NOTE — Telephone Encounter (Signed)
Therapist called to check in on patient. Patient reported that she was doing well and was waiting on HEP. Therapist explained that this was still in the mail. Patient also reported that she was interested in working specifically on transferring to and from the ground. Therapist explained that some of the HEP exercises targeting improving the patient's leg strength would do this, however, therapist stated she would let evaluating therapist know that the patient was interested in this skill upon returning to therapy. Patient gave her consent to this.  Verne Carrow PT, DPT 2:20 PM, 07/07/18 [phone]

## 2018-07-11 ENCOUNTER — Ambulatory Visit (HOSPITAL_COMMUNITY): Payer: Medicare Other

## 2018-07-12 ENCOUNTER — Telehealth (HOSPITAL_COMMUNITY): Payer: Self-pay

## 2018-07-12 NOTE — Telephone Encounter (Signed)
Called and spoke to pt regarding our clinic cancelling all appointments until further notice due to COVID-19. PT inquired if she ever received her mailed HEP and she stated that she had not yet received it; spoke to Valentino Saxon, who initially mailed it, and she stated she would print off another copy to mail to pt. This PT confirmed pt's address on file. Pt agreeable to weekly phone calls to check in on her exercises and overall status.   Jac Canavan PT, DPT

## 2018-07-13 ENCOUNTER — Ambulatory Visit (HOSPITAL_COMMUNITY): Payer: Medicare Other

## 2018-07-18 ENCOUNTER — Ambulatory Visit (HOSPITAL_COMMUNITY): Payer: Medicare Other

## 2018-07-20 ENCOUNTER — Ambulatory Visit (HOSPITAL_COMMUNITY): Payer: Medicare Other

## 2018-07-26 ENCOUNTER — Telehealth (HOSPITAL_COMMUNITY): Payer: Self-pay

## 2018-07-26 NOTE — Telephone Encounter (Signed)
Melissa West was contacted today regarding temporary reduction of Outpatient Rehabilitation Services at [facility] due to concerns for community transmission of COVID-19.  Patient identity was verified.  Assessed if patient needed to be seen in person by clinician (recent fall or acute injury that requires hands on assessment and advice, change in diet order, post-surgical, special cases, etc.).    Patient did not have an acute/special need that requires in person visit. Proceeded with phone call.  Therapist advised the patient to continue to perform his/her HEP and assured he/she had no unanswered questions or concerns at this time.  The patient expressed interest in being contacted for a virtual check in with a phone call but is not appropriate for telehealth visits. She requested additional exercises be mailed to her to update her HEP.  Outpatient Rehabilitation [facility] at [facility] will follow up with patient at that time.  Patient is aware we can be reached by telephone during limited business hours in the meantime.   Valentino Saxon, PT, DPT, [facility] Physical Therapist with [facility]  07/26/2018 4:05 PM

## 2018-07-31 ENCOUNTER — Encounter (HOSPITAL_COMMUNITY): Payer: Self-pay

## 2018-07-31 NOTE — Patient Instructions (Signed)
Side Arm Raises reps: 20 sets: 3 daily: 1 weekly: 7   Exercise image step 1   Exercise image step 2  Setup  Begin in an upright sitting position with your arms resting by your sides. Movement  With your thumbs pointing up, slowly lift both arms to your side then straight overhead as far as is comfortable, then lower them back down and repeat. Tip  Make sure to maintain good posture and keep your shoulders relaxed during the movement. Chest expansions reps: 20 sets: 3 daily: 1 weekly: 7   Exercise image step 1   Exercise image step 2  Setup  Begin sitting in an upright position with both arms raised in front of your body, thumbs pointing up. Movement  Slowly bring your arms out to your sides, then return them to the starting position and repeat. Tip  Make sure to move your arms horizontally and do not shrug your shoulders during the exercise. Disclaimer: This program provides exercises related to your condition that you can perform at home. As there is a risk of injury with any activity, use caution when performing exercises. If you experience any pain or discomfort, discontinue the exercises and contact your health care provider.  Login URL: [email or web address] . Access Code: [email or web address] . Date printed: 07/31/2018 Page 1  Seated Punches with Trunk Rotation reps: 20 sets: 3 daily: 1 weekly: 7   Exercise image step 1   Exercise image step 2  Setup  Begin sitting upright with your feet in a wide stance on the floor. Movement  Punch one arm forward and across your body, rotating your trunk, then return to the starting position and repeat with your other arm. Tip  Make sure to punch as far as you can, and keep your movements controlled. Seated Arm Circles reps: 20 sets: 3 daily: 1 weekly: 7   Exercise image step 1   Exercise image step 2  perform these fast  Setup  Begin sitting upright in a chair.  Movement  Lift your arms up until your hands are parallel to the  ground, keeping your shoulders relaxed. Slowly move your forearms in circles in front of you, then switch directions. Tip  Make sure to keep your movements controlled and do not shrug your shoulders during the exercise. Disclaimer: This program provides exercises related to your condition that you can perform at home. As there is a risk of injury with any activity, use caution when performing exercises. If you experience any pain or discomfort, discontinue the exercises and contact your health care provider.  Login URL: [email or web address] . Access Code: [email or web address] . Date printed: 07/31/2018 Page 2  Arm Circles on [employer] reps: 20 sets: 3 daily: 1 weekly: 7   Exercise image step 1   Exercise image step 2  Setup  Begin sitting upright on a swiss ball. Raise your arms directly to your sides with your elbows straight. Movement  Make small circles with your arms, first in one direction, and then in the other.  Tip  Make sure maintain your balance on the ball and do not arch your back during the exercise.

## 2018-08-01 ENCOUNTER — Ambulatory Visit (HOSPITAL_COMMUNITY): Payer: Medicare Other

## 2018-08-04 ENCOUNTER — Telehealth (HOSPITAL_COMMUNITY): Payer: Self-pay | Admitting: Physical Therapy

## 2018-08-04 NOTE — Telephone Encounter (Signed)
Therapist called to see if patient had received her new exercises and she stated that she had not yet received her exercises. Therapist explained that they are in the mail and should likely get to her soon. Therapist also asked the patient if she had any questions or concerns about her old exercises and she stated that she did not. Therapist explained that therapists would continue to call her weekly to check-in on her.   Verne Carrow PT, DPT 2:42 PM, 08/04/18 [phone]

## 2018-08-15 ENCOUNTER — Ambulatory Visit (HOSPITAL_COMMUNITY): Payer: Medicare Other

## 2018-08-18 DIAGNOSIS — Z0000 Encounter for general adult medical examination without abnormal findings: Secondary | ICD-10-CM | POA: Diagnosis not present

## 2018-08-22 ENCOUNTER — Telehealth (HOSPITAL_COMMUNITY): Payer: Self-pay | Admitting: Physical Therapy

## 2018-08-22 NOTE — Telephone Encounter (Signed)
Pt was contacted today by phone regarding resumption of therapy services following our temporary closure secondary to COVID-19.  Patient identity was verified  PT states she has been doing the HEP mailed to her and feels she is doing well with that.  Pt does state interest in possibly doing aquatics when that resumes.    Pt would like to be discharged from land based therapy at this time.  Lurena Nida, PTA/CLT [phone]

## 2018-08-29 ENCOUNTER — Ambulatory Visit (HOSPITAL_COMMUNITY): Payer: Medicare Other

## 2018-09-05 ENCOUNTER — Telehealth (HOSPITAL_COMMUNITY): Payer: Self-pay | Admitting: Internal Medicine

## 2018-09-05 ENCOUNTER — Ambulatory Visit (HOSPITAL_COMMUNITY): Payer: Medicare Other

## 2018-09-05 NOTE — Telephone Encounter (Signed)
09/05/18  pt called to cx and we rescheduled for 6/22.  I offered other dates and times but she wanted in the morning and she said to schedule for that late in the mont

## 2018-09-06 ENCOUNTER — Ambulatory Visit (HOSPITAL_COMMUNITY): Payer: Medicare Other

## 2018-09-12 ENCOUNTER — Ambulatory Visit (HOSPITAL_COMMUNITY): Payer: Medicare Other

## 2018-09-26 ENCOUNTER — Ambulatory Visit (HOSPITAL_COMMUNITY): Payer: Medicare Other

## 2018-09-29 ENCOUNTER — Telehealth (HOSPITAL_COMMUNITY): Payer: Self-pay

## 2018-09-29 NOTE — Telephone Encounter (Signed)
I called Ms. Oettinger and left a voice message regarding her upcoming appointment for therapy in our clinic. One of our communication notes indicated she no longer wanted to participate in land based therapy and would prefer to participate in aquatic therapy. As we are not able to offer this currently I wanted to inform the patient so she could make a decision with all information on continuing therapy. She is still interested in continuing with physical therapy in our clinic and we can expect to see her Monday 10/02/18 for her appointment.   Kipp Brood, PT, DPT, [facility] Physical Therapist with [facility]  09/29/2018 10:35 AM

## 2018-10-02 ENCOUNTER — Ambulatory Visit (HOSPITAL_COMMUNITY): Payer: Medicare Other | Attending: Internal Medicine

## 2018-10-02 ENCOUNTER — Other Ambulatory Visit: Payer: Self-pay

## 2018-10-02 ENCOUNTER — Encounter (HOSPITAL_COMMUNITY): Payer: Self-pay

## 2018-10-02 DIAGNOSIS — M6281 Muscle weakness (generalized): Secondary | ICD-10-CM | POA: Insufficient documentation

## 2018-10-02 DIAGNOSIS — R2689 Other abnormalities of gait and mobility: Secondary | ICD-10-CM | POA: Diagnosis not present

## 2018-10-02 DIAGNOSIS — G35 Multiple sclerosis: Secondary | ICD-10-CM | POA: Diagnosis not present

## 2018-10-02 DIAGNOSIS — R29818 Other symptoms and signs involving the nervous system: Secondary | ICD-10-CM | POA: Diagnosis not present

## 2018-10-02 NOTE — Therapy (Signed)
[place], [place], [address] Phone: [phone]   Fax:  [phone]  Physical Therapy Re-Evaluation  Patient Details  Name: Melissa West MRN: [id number] Date of Birth: November 23, 1990 Referring Provider (PT): Celene Squibb, MD   Encounter Date: 10/02/2018  PT End of Session - 10/02/18 1620    Visit Number  1    Number of Visits  4    Date for PT Re-Evaluation  10/30/18    Authorization Type  [employer] Medicare (no auth required, no visit limit, no co-pay)    Authorization Time Period  06/07/2018 - 07/21/2018; 10/02/18-10/30/18    Authorization - Visit Number  1    Authorization - Number of Visits  4    PT Start Time  1121    PT Stop Time  1204    PT Time Calculation (min)  43 min    Equipment Utilized During Treatment  Gait belt    Activity Tolerance  Patient tolerated treatment well   fatigue level at 5/10   Behavior During Therapy  [facility] for tasks assessed/performed       Past Medical History:  Diagnosis Date  . MS (multiple sclerosis) ([place])   . No pertinent past medical history     Past Surgical History:  Procedure Laterality Date  . CESAREAN SECTION  08/09/2011   Procedure: CESAREAN SECTION;  Surgeon: Guss Bunde, MD;  Location: [facility];  Service: Gynecology;  Laterality: N/A;    There were no vitals filed for this visit.  Subjective Assessment - 10/02/18 1128    Subjective  Patient feels like she is doing fine since last session. She feels like she has been doing some exercises and feesl her legs are not as stiff when she does them.    Pertinent History  ~ 10 year history of rapidly progressing MS. Pt has been primarily a wheelchair mobilizer for ~ 6 years. She has a 52 year old son.    Patient Stated Goals  --    Currently in Pain?  No/denies       [facility] PT Assessment - 10/02/18 0001      Assessment   Medical Diagnosis  Multiple Sclerosis    Referring Provider (PT)  Celene Squibb, MD    Hand  Dominance  Left    Prior Therapy  multiple bouts over the last several years, last time was in 2016      Precautions   Precautions  Fall      Restrictions   Weight Bearing Restrictions  No      Balance Screen   Has the patient fallen in the past 6 months  No      [place] residence    Living Arrangements  Children;Other (Comment)    Available Help at Discharge  Family;Personal care attendant    Type of [place]  One level    Home Equipment  Wheelchair - manual;Walker - 2 wheels;Walker - 4 wheels;Tub bench    Additional Comments  Pt has a nurse/aid who assists her 7x/week who arrives prior to breakfast and leaves between lunch and dinner. Patient reports her mother helps her as well and goes food shopping for her.      Prior Function   Level of Independence  Needs assistance with transfers;Needs assistance with homemaking;Needs assistance with ADLs      Cognition   Overall Cognitive Status  Within Functional Limits for tasks assessed      Functional Tests   Functional tests  Other      Other:   Other/ Comments  UE Press Ups: 14 reps in 30 seconds (4 reps with poor/reduced buttock clearance)      Deep Tendon Reflexes   DTR Assessment Site  --   clonus (+) bil LE at ankles     Transfers   Transfers  Sit to [employer];Lateral/Scoot Transfers    Sit to Stand  4: Min assist;With upper extremity assist   was Mod assist   Sit to Stand Details  Verbal cues for sequencing;Verbal cues for technique;Verbal cues for precautions/safety;Manual facilitation for weight shifting    Stand to Sit  4: Min assist;Uncontrolled descent;With upper extremity assist    Stand to Sit Details (indicate cue type and reason)  Verbal cues for sequencing;Verbal cues for technique;Verbal cues for precautions/safety    Squat Pivot Transfers  4: Min assist;With upper extremity assistance    Squat Pivot Transfer Details (indicate cue  type and reason)  Patient requires cues for hand placement to guide/direct transfer and cues to sequence head/hip relationship to initiate squat elevation and direct hips towards destination. Pt's awareness of head-hip relationship is much improved from last session.      Ambulation/Gait   Ambulation/Gait  No    Assistive device  Parallel bars    Pre-Gait Activities  Patient able to stand for 2 minutes with Bil UE support. Patietn performed 3 reps reaching up with Bil UE to perform standing iwth single UE support. Patient able to maintain balance.    Gait Comments  Attempted steps in // bars and patient unable to raise Rt or Lt LE up from floor.       RLE Tone   RLE Tone  Modified Ashworth      RLE Tone   Modified Ashworth Scale for Grading Hypertonia RLE  More marked increase in muscle tone through most of the ROM, but affected part(s) easily moved   quadriceps = 3, hamstring =2     LLE Tone   Modified Ashworth Scale for Grading Hypertonia LLE  More marked increase in muscle tone through most of the ROM, but affected part(s) easily moved   quadriceps = 3, hamstring =2       PT Education - 10/02/18 1615    Education Details  Educated patient on plan for conitnuing therapy. Discussed plan to contact PCP about referral to new neurologist and to discuss botox or baclofen to manage LE tone. Discussed realistic goals base don diagnosis and prognosis as well as current and PLOF.    Person(s) Educated  Patient    Methods  Explanation    Comprehension  Verbalized understanding;Returned demonstration       PT Short Term Goals - 10/02/18 1132      PT SHORT TERM GOAL #1   Title  Patient will tolerate full stance with RW for 30 seconds, followed by safe sequencing of decent to chair with UE use    Baseline  pt able to stand in // bars, not at RW this date    Time  2    Period  Weeks    Status  On-going    Target Date  10/16/18      PT SHORT TERM GOAL #2   Title  Patient will perform  squat pivot transfers with min assist to demosntrate improved ability to initiate transfer.     Time  2    Period  Weeks    Status  Partially Met    Target Date  10/16/18      PT SHORT TERM GOAL #3   Title  Patient will demonstrate correct form with HEP, updated PRN, to improve funcitonal strength and reduce tone for improved flexibility.     Time  3    Period  Weeks    Status  Achieved        PT Long Term Goals - 10/02/18 1731      PT LONG TERM GOAL #1   Title  Patient will perform stand pivot transfer with min assist to demonstrate improved funcitonal strength and mobility to assist with wheelchair <> toilet transfers.     Time  4    Period  Weeks    Status  On-going    Target Date  10/30/18      PT LONG TERM GOAL #2   Title  Patient will demonstrate independence with bed mobility to improve safety with going to bed each evening.     Time  4    Period  Weeks    Status  On-going    Target Date  10/30/18      PT LONG TERM GOAL #3   Title  Patient will perofrm squat pivot transfer with supervision up to min assist while instructing caregiver/aid on how to help her during transfer.     Time  4    Period  Weeks    Status  On-going    Target Date  10/30/18      PT LONG TERM GOAL #4   Title  Patient will tolerate full stance with RW for 1 minute, followed by safe sequencing of decent to chair with UE use to demonstrate improved endurance to perform hygeine tasks in standing.    Baseline  pt able to stand in // bars, not at RW this date    Time  4    Period  Weeks    Status  Partially Met    Target Date  10/30/18        Plan - 10/02/18 1622    Clinical Impression Statement  Melissa West is returning to physical therapy for re-evaluation after office closure for COVID-19. She has kept up with updated HEP that was mailed to her periodically during the closure and reported that it helped with her legs to prevent them from feeling stiff. She denies falls since being home and  continues to have an aid at home to assist her 7 days/week. Patient continues to have hypertonicity in bil LE that is limiting her ability to transfer and to perform standing activities. She was able to transfer with squat pivot technique this session and min assist but requires UE use to position LE's. She continues to be unable to step or lift LE's in standing with bil UE support due to hypertonicity. Patient will benefit from therapy for transfer training and from referral from PCP to new neurologist to manage tonicity and possibly adjust medication management for this.    Rehab Potential  Fair    PT Frequency  1x / week    PT Duration  4 weeks    PT Treatment/Interventions  ADLs/Self Care Home Management;Aquatic Therapy;Biofeedback;Cryotherapy;Electrical Stimulation;Moist Heat;Iontophoresis 23m/ml Dexamethasone;DME Instruction;Gait training;Stair training;Functional mobility training;Therapeutic activities;Therapeutic exercise;Balance training;Neuromuscular re-education;Patient/family education;Manual techniques;Passive range of motion;Energy conservation;Spinal Manipulations;Joint Manipulations    PT Next Visit Plan  Perform squat pivot transfer training to increase pt independence. Continued bed mobility  transfer safety and possible addition of trapeze bar in bedroom to assist.  Perform standing exercises for balance training and follow up on PCP referral to neurologist or beginning botox/baclofen for tone.    PT Home Exercise Plan  3/12: isometric gluteal and hamstrings in seated position    Consulted and Agree with Plan of Care  Patient       Patient will benefit from skilled therapeutic intervention in order to improve the following deficits and impairments:  Abnormal gait, Decreased activity tolerance, Decreased endurance, Decreased range of motion, Decreased strength, Impaired perceived functional ability, Decreased balance, Decreased coordination, Decreased knowledge of precautions, Decreased  mobility, Decreased safety awareness, Difficulty walking, Impaired tone, Postural dysfunction, Obesity, Decreased knowledge of use of DME  Visit Diagnosis: 1. Multiple sclerosis ([place])   2. Other abnormalities of gait and mobility   3. Muscle weakness (generalized)   4. Other symptoms and signs involving the nervous system        Problem List Patient Active Problem List   Diagnosis Date Noted  . MS (multiple sclerosis) ([place])   . Neurological deficit present 11/11/2010    Kipp Brood, PT, DPT, [facility] Physical Therapist with [facility]  10/02/2018 5:22 PM        [place] [address] [place], [place], [address] Phone: [phone]   Fax:  [phone]  Name: Melissa West MRN: [id number] Date of Birth: 28-Oct-1990

## 2018-10-03 ENCOUNTER — Ambulatory Visit (HOSPITAL_COMMUNITY): Payer: Medicare Other

## 2018-10-10 ENCOUNTER — Ambulatory Visit (HOSPITAL_COMMUNITY): Payer: Medicare Other

## 2018-10-16 ENCOUNTER — Telehealth (HOSPITAL_COMMUNITY): Payer: Self-pay | Admitting: Internal Medicine

## 2018-10-16 NOTE — Telephone Encounter (Signed)
10/16/18  Trying to reschedule appt on 7/8 with Apolonio Schneiders and left patient a message to offer 7/7 with Brooke at 3:15, 7/9 with Myriam Jacobson at 1015, 7/10 with Myriam Jacobson at 215

## 2018-10-18 ENCOUNTER — Ambulatory Visit (HOSPITAL_COMMUNITY): Payer: Medicare Other

## 2018-10-18 ENCOUNTER — Telehealth (HOSPITAL_COMMUNITY): Payer: Self-pay

## 2018-10-18 NOTE — Telephone Encounter (Signed)
I saw that Raley was still on Lilly schedule and called pt she wanted to cx this apptment and return on 7/14 as scheduled- she uses [facility].

## 2018-10-24 ENCOUNTER — Ambulatory Visit (HOSPITAL_COMMUNITY): Payer: Medicare Other | Admitting: Physical Therapy

## 2018-10-25 DIAGNOSIS — M9903 Segmental and somatic dysfunction of lumbar region: Secondary | ICD-10-CM | POA: Diagnosis not present

## 2018-10-25 DIAGNOSIS — G35 Multiple sclerosis: Secondary | ICD-10-CM | POA: Diagnosis not present

## 2018-10-25 DIAGNOSIS — M545 Low back pain: Secondary | ICD-10-CM | POA: Diagnosis not present

## 2018-10-30 DIAGNOSIS — G35 Multiple sclerosis: Secondary | ICD-10-CM | POA: Diagnosis not present

## 2018-10-30 DIAGNOSIS — M9903 Segmental and somatic dysfunction of lumbar region: Secondary | ICD-10-CM | POA: Diagnosis not present

## 2018-10-30 DIAGNOSIS — M545 Low back pain: Secondary | ICD-10-CM | POA: Diagnosis not present

## 2018-11-01 ENCOUNTER — Encounter (HOSPITAL_COMMUNITY): Payer: Self-pay

## 2018-11-01 ENCOUNTER — Ambulatory Visit (HOSPITAL_COMMUNITY): Payer: Medicare Other | Attending: Internal Medicine

## 2018-11-01 ENCOUNTER — Other Ambulatory Visit: Payer: Self-pay

## 2018-11-01 DIAGNOSIS — G35 Multiple sclerosis: Secondary | ICD-10-CM | POA: Insufficient documentation

## 2018-11-01 DIAGNOSIS — R2689 Other abnormalities of gait and mobility: Secondary | ICD-10-CM | POA: Insufficient documentation

## 2018-11-01 DIAGNOSIS — R29818 Other symptoms and signs involving the nervous system: Secondary | ICD-10-CM | POA: Insufficient documentation

## 2018-11-01 DIAGNOSIS — M6281 Muscle weakness (generalized): Secondary | ICD-10-CM | POA: Diagnosis not present

## 2018-11-01 NOTE — Therapy (Signed)
[place], [place], [address] Phone: [phone]   Fax:  [phone]  Physical Therapy Treatment  Patient Details  Name: Melissa West MRN: [id number] Date of Birth: 07/01/1990 Referring Provider (PT): Celene Squibb, MD   Encounter Date: 11/01/2018  PT End of Session - 11/02/18 1323    Visit Number  2    Number of Visits  4    Date for PT Re-Evaluation  11/10/18    Authorization Type  [employer] Medicare (no auth required, no visit limit, no co-pay)    Authorization Time Period  06/07/2018 - 07/21/2018; 10/02/18-10/30/18    Authorization - Visit Number  2    Authorization - Number of Visits  4    PT Start Time  1118    PT Stop Time  1205    PT Time Calculation (min)  47 min    Equipment Utilized During Treatment  Gait belt    Activity Tolerance  Patient tolerated treatment well   fatigue level at 5/10   Behavior During Therapy  [facility] for tasks assessed/performed       Past Medical History:  Diagnosis Date  . MS (multiple sclerosis) ([place])   . No pertinent past medical history     Past Surgical History:  Procedure Laterality Date  . CESAREAN SECTION  08/09/2011   Procedure: CESAREAN SECTION;  Surgeon: Guss Bunde, MD;  Location: [facility];  Service: Gynecology;  Laterality: N/A;    There were no vitals filed for this visit.  Subjective Assessment - 11/01/18 1015    Subjective  Patient reports she is still doing fine. She is now going to a chiropractor for her lower back pain and appears apprehensive and slightly aggitated when asked about how her visits with the chiropractor are going. She states she is doing her HEP a few times each week but not every day.    Pertinent History  ~ 10 year history of rapidly progressing MS. Pt has been primarily a wheelchair mobilizer for ~ 6 years. She has a 71 year old son.    Limitations  Sitting;Standing;Walking;House hold activities;Other (comment)    How long can you sit  comfortably?  unlimited, sits in wheelchair most of the day    Currently in Pain?  No/denies          [facility] PT Treatment/Exercise - 11/01/18 0001  Transfers  Transfers Sit to [employer];Lateral/Scoot Transfers  Sit to Stand 4: Min assist;4: Min guard;With upper extremity assist  Sit to Stand Details Verbal cues for sequencing;Verbal cues for technique;Verbal cues for precautions/safety  Sit to Stand Details (indicate cue type and reason) Pt performed sit<>stand from her personal w/c in // bars with bil UE support. She   Stand to Sit 4: Min assist;4: Min guard  Stand to Sit Details (indicate cue type and reason) Verbal cues for technique;Verbal cues for sequencing (guardign w/c for safety)  Squat Pivot Transfers 4: Min assist;3: Mod assist;With upper extremity assistance  Squat Pivot Transfer Details (indicate cue type and reason) Patient continue to require cues for hand placement to guide/direct transfer and cues to sequence head/hip relationship to initiate squat, buttock elevation, and direct hips towards destination. Manual facilitation provided to initiate elevation and to rotate/pivot.  Knee/Hip Exercises: Standing  Other Standing Knee Exercises Standing Endurance in // Bars: 3x 1'30" standing with single UE support while tossing scarf with contralateral UE   Knee/Hip Exercises: Seated  Other Seated Knee/Hip Exercises 10x  press ups and lifts to practice initiation of squat pivot transfers, min assist to guard and to facilitate lift and bottom clearance.  Sit to Sand 2 sets;10 reps;with UE support (in // bars with 2" pad on w/c to increase LE use)     PT Education - 11/01/18 1346    Education Details  Educated patient on purpose of exercises throughout session and on need to contract PCP and request referral for new neurologist to develop plan to address LE tone.    Person(s) Educated  Patient    Methods  Explanation    Comprehension  Verbalized understanding        PT Short Term Goals - 10/02/18 1132      PT SHORT TERM GOAL #1   Title  Patient will tolerate full stance with RW for 30 seconds, followed by safe sequencing of decent to chair with UE use    Baseline  pt able to stand in // bars, not at RW this date    Time  2    Period  Weeks    Status  On-going    Target Date  10/16/18      PT SHORT TERM GOAL #2   Title  Patient will perform squat pivot transfers with min assist to demosntrate improved ability to initiate transfer.     Time  2    Period  Weeks    Status  Partially Met    Target Date  10/16/18      PT SHORT TERM GOAL #3   Title  Patient will demonstrate correct form with HEP, updated PRN, to improve funcitonal strength and reduce tone for improved flexibility.     Time  3    Period  Weeks    Status  Achieved        PT Long Term Goals - 10/02/18 1731      PT LONG TERM GOAL #1   Title  Patient will perform stand pivot transfer with min assist to demonstrate improved funcitonal strength and mobility to assist with wheelchair <> toilet transfers.     Time  4    Period  Weeks    Status  On-going    Target Date  10/30/18      PT LONG TERM GOAL #2   Title  Patient will demonstrate independence with bed mobility to improve safety with going to bed each evening.     Time  4    Period  Weeks    Status  On-going    Target Date  10/30/18      PT LONG TERM GOAL #3   Title  Patient will perofrm squat pivot transfer with supervision up to min assist while instructing caregiver/aid on how to help her during transfer.     Time  4    Period  Weeks    Status  On-going    Target Date  10/30/18      PT LONG TERM GOAL #4   Title  Patient will tolerate full stance with RW for 1 minute, followed by safe sequencing of decent to chair with UE use to demonstrate improved endurance to perform hygeine tasks in standing.    Baseline  pt able to stand in // bars, not at RW this date    Time  4    Period  Weeks    Status  Partially Met     Target Date  10/30/18         Plan - 11/02/18  1325    Clinical Impression Statement  Patient returns to therapy for 2nd session of 4 that were scheduled during the prior 4 weeks. She reports continued participation of HEP and that she has also been seeing a chiropractor for lower back pain. This session focused on standing endurance exercises and on squat pivot training again. She was educated again on benefits of botox/baclofen to manage LE tone. She was encouraged to request a new referral from her PCP for a new neurologist as she reports not being pleased with her prior care. Patient did not have good carry over from any prior sessions with squat pivot transfers technique and required moderate assist to scoot/pivot but was able to initiate the lift. She has maintained her current level of functional independence and would likely benefit more from therapy if tone was addressed. Patient will benefit from discussion next session about discontinuing therapy until she is able to follow up with a new neurologist and trial botox or baclofen to manage LE tone.    Rehab Potential  Fair    PT Frequency  1x / week    PT Duration  4 weeks    PT Treatment/Interventions  ADLs/Self Care Home Management;Aquatic Therapy;Biofeedback;Cryotherapy;Electrical Stimulation;Moist Heat;Iontophoresis 55m/ml Dexamethasone;DME Instruction;Gait training;Stair training;Functional mobility training;Therapeutic activities;Therapeutic exercise;Balance training;Neuromuscular re-education;Patient/family education;Manual techniques;Passive range of motion;Energy conservation;Spinal Manipulations;Joint Manipulations    PT Next Visit Plan  Perform squat pivot transfer training and lateral scoot training to increase pt independence. Continue bed mobility transfer safety and possible addition of trapeze bar in bedroom to assist. Perform standing exercises for balance training and follow up on PCP referral to neurologist or beginning  botox/baclofen for tone. Discuss possibility of discharging this round of PT and returning once pt has met with new neurologist and developed plan to manage LE tone.    PT Home Exercise Plan  3/12: isometric gluteal and hamstrings in seated position    Consulted and Agree with Plan of Care  Patient       Patient will benefit from skilled therapeutic intervention in order to improve the following deficits and impairments:  Abnormal gait, Decreased activity tolerance, Decreased endurance, Decreased range of motion, Decreased strength, Impaired perceived functional ability, Decreased balance, Decreased coordination, Decreased knowledge of precautions, Decreased mobility, Decreased safety awareness, Difficulty walking, Impaired tone, Postural dysfunction, Obesity, Decreased knowledge of use of DME  Visit Diagnosis: 1. Multiple sclerosis ([place]   2. Other abnormalities of gait and mobility   3. Muscle weakness (generalized)   4. Other symptoms and signs involving the nervous system        Problem List Patient Active Problem List   Diagnosis Date Noted  . [place]) [place]   . Neurological deficit present 11/11/2010    RKipp Brood PT, DPT, [facility] 11/01/2018, 5:18 PM  [place] [address]Phone: [phone]  Fax:  [phone] Name: Melissa CourtneyMRN: [id number] of Birth: [birth year]

## 2018-11-08 ENCOUNTER — Encounter (HOSPITAL_COMMUNITY): Payer: Self-pay

## 2018-11-08 ENCOUNTER — Telehealth (HOSPITAL_COMMUNITY): Payer: Self-pay

## 2018-11-08 ENCOUNTER — Ambulatory Visit (HOSPITAL_COMMUNITY): Payer: Medicare Other

## 2018-11-08 NOTE — Therapy (Signed)
[place], [place], [address] Phone: [phone]   Fax:  [phone]  Patient Details  Name: Melissa West MRN: [id number] Date of Birth: 1991-01-25 Referring Provider:  No ref. provider found  Encounter Date: 11/08/2018   PHYSICAL THERAPY DISCHARGE SUMMARY  Visits from Start of Care: 6  Current functional level related to goals / functional outcomes: See last treatment note (11/02/18)   Remaining deficits: See last treatment note (11/02/18)   Education / Equipment: See last treatment note (11/02/18) Plan: Patient agrees to discharge.  Patient goals were not met. Patient is being discharged due to lack of progress.  ?????    Telephone call 11/08/18 regarding discharge: No Show. This therapist called pt due to missing appointment today at 11:15. First call dropped. Second call successful; spoke with pt regarding missing appointment today and pt reports "yeah that's my last one so just cancel it". Educated pt on the appointment being a discharge from therapy this date and pt became agitated asking why she was being discharged. This therapist reminded pt that at her last appointment, pt and the therapist(Rachel) treating her discussed establishing care with a new neurologist and inquiring about baclofen/botox for tone control due to it limiting progress with therapy. Also, educated pt on plateau with therapy at this time due to tone and the benefits of baclofen/botox to improve tone and reduce limitations with functional mobility. Pt verbalizes understanding. Pt reports she called her current neurologist and must submit written statement regarding wanting to switch neurologists in order to establish care somewhere new; pt reports she has not completed that task yet. Again educated pt on today being last visit and asked if pt wanted to reschedule or discharge over the phone and pt reports not wanting to come in, to be  discharged now. This therapist educated pt on receiving new referral for PT if needs arise.    Talbot Grumbling PT, DPT 11/08/18, 11:56 AM [place], [place], [address] Phone: [phone]   Fax:  [phone]

## 2018-11-08 NOTE — Telephone Encounter (Signed)
No Show. This therapist called pt due to missing appointment today at 11:15. First call dropped. Second call successful; spoke with pt regarding missing appointment today and pt reports "yeah that's my last one so just cancel it". Educated pt on the appointment being a discharge from therapy this date and pt became agitated asking why she was being discharged. This therapist reminded pt that at her last appointment, pt and the therapist(Rachel) treating her discussed establishing care with a new neurologist and inquiring about baclofen/botox for tone control due to it limiting progress with therapy. Also, educated pt on plateau with therapy at this time due to tone and the benefits of baclofen/botox to improve tone and reduce limitations with functional mobility. Pt verbalizes understanding. Pt reports she called her current neurologist and must submit written statement regarding wanting to switch neurologists in order to establish care somewhere new; pt reports she has not completed that task yet. Again educated pt on today being last visit and asked if pt wanted to reschedule or discharge over the phone and pt reports not wanting to come in, to be discharged now. This therapist educated pt on receiving new referral for PT if needs arise.  Melissa West PT, DPT 11/08/18, 11:45 AM [phone]

## 2018-11-20 DIAGNOSIS — M9903 Segmental and somatic dysfunction of lumbar region: Secondary | ICD-10-CM | POA: Diagnosis not present

## 2018-11-20 DIAGNOSIS — G35 Multiple sclerosis: Secondary | ICD-10-CM | POA: Diagnosis not present

## 2018-11-20 DIAGNOSIS — M545 Low back pain: Secondary | ICD-10-CM | POA: Diagnosis not present

## 2018-11-29 ENCOUNTER — Encounter: Payer: Self-pay | Admitting: Neurology

## 2018-11-29 ENCOUNTER — Other Ambulatory Visit: Payer: Self-pay

## 2018-11-29 ENCOUNTER — Telehealth: Payer: Self-pay | Admitting: *Deleted

## 2018-11-29 ENCOUNTER — Telehealth: Payer: Self-pay | Admitting: Neurology

## 2018-11-29 ENCOUNTER — Ambulatory Visit (INDEPENDENT_AMBULATORY_CARE_PROVIDER_SITE_OTHER): Payer: Medicare Other | Admitting: Neurology

## 2018-11-29 VITALS — BP 104/69 | HR 80 | Temp 97.3°F | Ht 62.0 in

## 2018-11-29 DIAGNOSIS — E559 Vitamin D deficiency, unspecified: Secondary | ICD-10-CM | POA: Diagnosis not present

## 2018-11-29 DIAGNOSIS — Z993 Dependence on wheelchair: Secondary | ICD-10-CM | POA: Insufficient documentation

## 2018-11-29 DIAGNOSIS — H5500 Unspecified nystagmus: Secondary | ICD-10-CM | POA: Insufficient documentation

## 2018-11-29 DIAGNOSIS — Z79899 Other long term (current) drug therapy: Secondary | ICD-10-CM

## 2018-11-29 DIAGNOSIS — R270 Ataxia, unspecified: Secondary | ICD-10-CM | POA: Insufficient documentation

## 2018-11-29 DIAGNOSIS — N3941 Urge incontinence: Secondary | ICD-10-CM | POA: Insufficient documentation

## 2018-11-29 DIAGNOSIS — G35 Multiple sclerosis: Secondary | ICD-10-CM | POA: Diagnosis not present

## 2018-11-29 MED ORDER — MODAFINIL 200 MG PO TABS: 200.0000 mg | ORAL_TABLET | Freq: Every day | ORAL | 5 refills | Status: DC

## 2018-11-29 MED ORDER — SOLIFENACIN SUCCINATE 10 MG PO TABS
10.0000 mg | ORAL_TABLET | Freq: Every day | ORAL | 11 refills | Status: DC
Start: 2018-11-29 — End: 2021-03-11

## 2018-11-29 NOTE — Telephone Encounter (Signed)
[employer] medicare/medicaid order sent to [facility]. No auth they will reach out to the patient to schedule.  °

## 2018-11-29 NOTE — Progress Notes (Signed)
[facility]  PATIENT: Melissa West DOB: 1991/01/24  REFERRING DOCTOR OR PCP:  Nita SellsJohn Hall SOURCE: Patient, notes from primary care, notes from [facility], multiple MRI reports,[id number]  _________________________________   HISTORICAL  CHIEF COMPLAINT:  Chief Complaint  Patient presents with   New Patient (Initial Visit)    RM 13, alone. In wheelchair in office today, unable to stand independently. High fall risk. Came by transportation set up via her insurance.  Paper referral from Dr. Margo AyeHall for MS for second opinion.    Multiple Sclerosis    Pt most recently on Ocrevus q766months but has been non compliant per referral notes. Previously tried: Betaseron(had disease progression), she has hx of JCV ab positive (had been on Tysabri in the past). Had been on Tecfidera in the past as well. Had MRI brain/cervical 2015 (can be viewed in care everywhere)    HISTORY OF PRESENT ILLNESS:  I had the pleasure seeing your patient, Melissa West, at the [facility] center [facility] for neurologic consultation regarding her multiple sclerosis.  She is a poor historian and extensive notes [facility] over the past decade were reviewed.  Additionally a note from Dr. Gerilyn Pilgrimoonquah was reviewed.  She is a 28 year old woman who was diagnosed with MS about 11 years ago.  At age 28, she had some falls due to poor balance not weakness.   A few months later, she had MRI's performed consistent with MS.  She was placed on Betaseron initially.    She did well for a while and was walking without an aid.  She returned to work.    She thinks she stopped the Betaseron and then had more symptoms including poor gait and diplopia.   She started Tysabri but stopped it during pregnancy.   She had a period of time (2013-2014) without any medications (she was still doing relatively well then).    She had more trouble with her right leg with weakness and spasticity  and returned to [facility] (Dr. Renne CriglerPharr) in 2014.   She was placed back on Tysabri and stayed on until October 2016.   JCV Ab was 0.23 indeterminate 12/2014.    She then stopped the Tysabri and started to see Dr. Gerilyn Pilgrimoonquah.   She started Tecfidera but she thinks she was worsening.  In 2018, she had a positive JCV Ab titer (0.44) and a decision was made to swtich to Ocrevus.    About 2 years ago, she went on Ocrevus (thinks only 2 doses).  She hasn't seen any neurologist since and she feels she has progressed a lot.     Currently she is wheelchair bound.    She was able to walk some without a cane in 2016 and steadily worsened after she stopped Tysabri.    She can move legs bit not lift up against gravity.  Balance is very poor.  She has no arm weakness but has poor coordination in the arms.    She denies diplopia but has a dysconjugate gaze.    Vision is reduced even with glasses.    She has urinary frequency and urge incontinence.    She thinks she empties.  She feels cognition is baseline.   She denies depression and anxiety.     She is always tired and sleepy.  She sleeps excessively.    She snores some but has never been told there are pauses or gasps.    Labs able to review some old imaging studies.  A CT scan of the head 07/02/2012 showed white matter lesions consistent with MS that were not present in 2009.  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  MRI of the lumbar spine 10/31/2012 showed demyelinating plaque in the lower thoracic spinal cord and conus medullaris.  MRI report 12/28/2013 of the brain (compared to 2011 )states: Interval disease progression indicated by increased number of innumerable T2 hyperintense lesions within the periventricular and subcortical white matter involving both cerebral hemispheres, with additional foci in the cerebellum and brainstem. No enhancing lesions or restricted diffusion.  MRI cervical spine report 04/12/2013 (compared to 2011)  shows: Extensive signal abnormality within the brainstem and cervical spinal cord in keeping with multiple sclerosis. There has been interval progression of disease within the brainstem and possibly at C3-4 within the spinal cord. No enhancement seen on today's exam.   REVIEW OF SYSTEMS: Constitutional: No fevers, chills, sweats, or change in appetite.  She has excessive sleepiness.   Eyes: No visual changes, double vision, eye pain Ear, nose and throat: No hearing loss, ear pain, nasal congestion, sore throat Cardiovascular: No chest pain, palpitations Respiratory: No shortness of breath at rest or with exertion.   She snores but reports that a sleep study did not show sleep apnea.   GastrointestinaI: No nausea, vomiting, diarrhea, abdominal pain, fecal incontinence Genitourinary: No dysuria, urinary retention or frequency.  No nocturia. Musculoskeletal: No neck pain, back pain Integumentary: No rash, pruritus, skin lesions Neurological: as above Psychiatric: No depression at this time.  No anxiety Endocrine: No palpitations, diaphoresis, change in appetite, change in weigh or increased thirst Hematologic/Lymphatic: No anemia, purpura, petechiae. Allergic/Immunologic: No itchy/runny eyes, nasal congestion, recent allergic reactions, rashes  ALLERGIES: No Known Allergies  HOME MEDICATIONS:  Current Outpatient Medications:    cholecalciferol (VITAMIN D3) 25 MCG (1000 UT) tablet, Take 1,000 Units by mouth daily., Disp: , Rfl:   PAST MEDICAL HISTORY: Past Medical History:  Diagnosis Date   MS (multiple sclerosis) (HCC)    No pertinent past medical history     PAST SURGICAL HISTORY: Past Surgical History:  Procedure Laterality Date   CESAREAN SECTION  08/09/2011   Procedure: CESAREAN SECTION;  Surgeon: Lesly Dukes, MD;  Location: [facility];  Service: Gynecology;  Laterality: N/A;    FAMILY HISTORY: Family History  Problem Relation Age of Onset   Diabetes Maternal  Grandmother    Hypertension Maternal Grandmother    Kidney disease Maternal Grandmother     SOCIAL HISTORY:  Social History   Socioeconomic History   Marital status: Single    Spouse name: Not on file   Number of children: Not on file   Years of education: Not on file   Highest education level: Not on file  Occupational History   Not on file  Social Needs   Financial resource strain: Not on file   Food insecurity    Worry: Not on file    Inability: Not on file   Transportation needs    Medical: Not on file    Non-medical: Not on file  Tobacco Use   Smoking status: Never Smoker   Smokeless tobacco: Never Used  Substance and Sexual Activity   Alcohol use: No   Drug use: No   Sexual activity: Yes    Birth control/protection: None  Lifestyle   Physical activity    Days per week: Not on file    Minutes per session: Not on file   Stress: Not on  file  Relationships   Social connections    Talks on phone: Not on file    Gets together: Not on file    Attends religious service: Not on file    Active member of club or organization: Not on file    Attends meetings of clubs or organizations: Not on file    Relationship status: Not on file   Intimate partner violence    Fear of current or ex partner: Not on file    Emotionally abused: Not on file    Physically abused: Not on file    Forced sexual activity: Not on file  Other Topics Concern   Not on file  Social History Narrative   Left handed    Lives with kid   Caffeine use: none     PHYSICAL EXAM  Vitals:   11/29/18 0850  BP: 104/69  Pulse: 80  Temp: (!) 97.3 F (36.3 C)  SpO2: 98%  Height: 5\' 2"  (1.575 m)    Body mass index is 33.84 kg/m.   General: The patient is well-developed and well-nourished and in no acute distress  HEENT:  Head is [place]/AT.  Sclera are anicteric.  Funduscopic exam shows normal optic discs and retinal vessels.  Neck: No carotid bruits are noted.  The neck is  nontender.  Cardiovascular: The heart has a regular rate and rhythm with a normal S1 and S2. There were no murmurs, gallops or rubs.    Skin: Extremities are without rash or  edema.  Musculoskeletal:  Back is nontender  Neurologic Exam  Mental status: The patient is alert and oriented x 3 at the time of the examination. The patient has apparent normal recent and remote memory, with an apparently normal attention span and concentration ability.   Speech is normal.  Cranial nerves: She has a disconjugate gaze with left exotropia.  She has nystagmus with all gazes.. Pupils are equal, round, and reactive to light and accomodation.  Visual fields are full.  Facial symmetry is present. There is good facial sensation to soft touch bilaterally.Facial strength is normal.  Trapezius and sternocleidomastoid strength is normal. No dysarthria is noted.  The tongue is midline, and the patient has symmetric elevation of the soft palate. No obvious hearing deficits are noted.  Motor:  Muscle bulk is normal.   Tone is increased in the legs.  Strength was normal in the arms though rapid rotating movements were mildly reduced on the left.  Strength was 2- to2/5 in the legs.  Sensory: Sensory testing is intact to pinprick, soft touch and vibration sensation in the arms but reduced vibration in the legs.  Coordination: Cerebellar testing reveals very ataxic finger-nose-finger bilaterally.  She is unable to move her legs.  Gait and station: She is wheelchair-bound and cannot stand even with strong support.  Romberg is negative.   Reflexes: Deep tendon reflexes are increased.  In the legs, there is spread at the knees and clonus at the ankles.  Plantar responses are extensor.      ASSESSMENT AND PLAN  1. MS (multiple sclerosis) (HCC)   2. Wheelchair bound   3. Ataxia   4. Nystagmus   5. Urinary incontinence, urge   6. Multiple sclerosis (HCC)   7. Vitamin D deficiency   8. High risk medication use      In summary, Melissa West is a 28 year old woman diagnosed with MS at age 28 who is wheelchair-bound likely due to her multiple spinal cord lesions.  Unfortunately, there  has been noncompliance at times.  However, due to the aggressiveness of her MS and age.  She needs to resume a disease modifying therapy.  She appeared to be most stable while on Tysabri.  She had a low positive JCV antibody titer.  With that, her risk of PML would be about 1: 2000 after 2 years.  That risk is possibly lower if the interval of dosing is extended to 6 weeks instead of 4 weeks.  We will recheck the JCV antibody.  If it is negative or low positive, we will begin Tysabri.  If low positive after a few doses will extend to every 6 weeks.  If she is middle or high positive, then I would recommend Ocrevus.  We will check lab work to make sure that she does not have a chronic active hepatitis or TB.  She has not had any imaging studies for about 4 years and we will check an MRI of the brain and cervical spine to determine the current activity of the MS and set a baseline.  She has urinary urge incontinence and I will start Vesicare.  Additionally, she has excessive daytime sleepiness.  By report, she did not have sleep apnea.  I will have her try modafinil to see if that can help the daytime sleepiness.   Dhairya Corales A. Felecia Shelling, MD, [facility] 5/99/7741, 4:23 AM Certified in Neurology, Clinical Neurophysiology, Sleep Medicine and Neuroimaging  [facility] [address], [place], [place] [address] [phone]

## 2018-11-29 NOTE — Telephone Encounter (Signed)
Placed JCV lab in [facility] lock box for routine lab pick up. Results pending. 

## 2018-12-02 LAB — HEPATIC FUNCTION PANEL
ALT: 12 IU/L (ref 0–32)
AST: 14 IU/L (ref 0–40)
Albumin: 3.9 g/dL (ref 3.9–5.0)
Alkaline Phosphatase: 66 IU/L (ref 39–117)
Bilirubin Total: 0.4 mg/dL (ref 0.0–1.2)
Bilirubin, Direct: 0.11 mg/dL (ref 0.00–0.40)
Total Protein: 6.8 g/dL (ref 6.0–8.5)

## 2018-12-02 LAB — QUANTIFERON-TB GOLD PLUS
QuantiFERON Mitogen Value: >10 IU/mL
QuantiFERON Nil Value: 0.01 IU/mL
QuantiFERON TB1 Ag Value: 0.01 IU/mL
QuantiFERON TB2 Ag Value: 0.02 IU/mL
QuantiFERON-TB Gold Plus: NEGATIVE

## 2018-12-02 LAB — HEPATITIS B CORE ANTIBODY, TOTAL: Hep B Core Total Ab: NEGATIVE

## 2018-12-02 LAB — CBC WITH DIFFERENTIAL/PLATELET
Basophils Absolute: 0 10*3/uL (ref 0.0–0.2)
Basos: 0 %
EOS (ABSOLUTE): 0 10*3/uL (ref 0.0–0.4)
Eos: 1 %
Hematocrit: 38.5 % (ref 34.0–46.6)
Hemoglobin: 12.4 g/dL (ref 11.1–15.9)
Immature Grans (Abs): 0 10*3/uL (ref 0.0–0.1)
Immature Granulocytes: 0 %
Lymphocytes Absolute: 1.6 10*3/uL (ref 0.7–3.1)
Lymphs: 35 %
MCH: 28 pg (ref 26.6–33.0)
MCHC: 32.2 g/dL (ref 31.5–35.7)
MCV: 87 fL (ref 79–97)
Monocytes Absolute: 0.3 10*3/uL (ref 0.1–0.9)
Monocytes: 6 %
Neutrophils Absolute: 2.6 10*3/uL (ref 1.4–7.0)
Neutrophils: 58 %
Platelets: 249 10*3/uL (ref 150–450)
RBC: 4.43 x10E6/uL (ref 3.77–5.28)
RDW: 14 % (ref 11.7–15.4)
WBC: 4.5 10*3/uL (ref 3.4–10.8)

## 2018-12-02 LAB — HEPATITIS B SURFACE ANTIGEN: Hepatitis B Surface Ag: NEGATIVE

## 2018-12-02 LAB — VITAMIN D 25 HYDROXY (VIT D DEFICIENCY, FRACTURES): Vit D, 25-Hydroxy: 43.1 ng/mL (ref 30.0–100.0)

## 2018-12-02 LAB — HEPATITIS B SURFACE ANTIBODY,QUALITATIVE: Hep B Surface Ab, Qual: NONREACTIVE

## 2018-12-02 LAB — HEPATITIS C ANTIBODY: Hep C Virus Ab: 0.1 s/co ratio (ref 0.0–0.9)

## 2018-12-11 ENCOUNTER — Telehealth: Payer: Self-pay | Admitting: *Deleted

## 2018-12-11 NOTE — Telephone Encounter (Signed)
Submitted PA modafinil on CMM. Key: [id number]. Waiting on determination from [employer]. Member ID: [id number].

## 2018-12-11 NOTE — Telephone Encounter (Signed)
Request Reference Number: [id number]. MODAFINIL TAB 200MG  is approved through 06/10/2019. For further questions, call [phone].

## 2018-12-13 ENCOUNTER — Telehealth: Payer: Self-pay | Admitting: *Deleted

## 2018-12-13 NOTE — Telephone Encounter (Signed)
Received results from [facility] via fax. JCV ab drawn on 11/29/18 negative, index: 0.19  Per Dr. Felecia Shelling, okay to start pt on Tysabri.

## 2018-12-13 NOTE — Telephone Encounter (Signed)
Called [facility] since we have not received results yet. They verified results back. They will fax to [phone]. Waiting on fax.

## 2018-12-13 NOTE — Telephone Encounter (Signed)
Faxed completed/signed Tysabri start form to [facility] at [phone]. Received fax confirmation. Gave completed form to [facility]/sent copy to be scanned to chart, OV notes, insurance, demographics, labs, order form for [facility].

## 2019-01-02 ENCOUNTER — Inpatient Hospital Stay: Admission: RE | Admit: 2019-01-02 | Payer: Medicare Other | Source: Ambulatory Visit

## 2019-01-04 ENCOUNTER — Telehealth: Payer: Self-pay | Admitting: *Deleted

## 2019-01-04 NOTE — Telephone Encounter (Signed)
Spoke with Melissa West, they also received message that [facility] Ham[facility] is trying to process order and pending her consent to schedule shipment. She needs to call them back at [phone] to provide consent. They are unable to move forward until this is complete.

## 2019-01-04 NOTE — Telephone Encounter (Signed)
Called, LVM for pt asking she call office. Noticed her MRI's that were scheduled for 01/02/2019 were cx. Just wondering if she got those rescheduled.   She also needs to make a f/u for around 03/31/2019 per Dr. Felecia Shelling last note

## 2019-01-09 NOTE — Telephone Encounter (Signed)
Called, LVM with info below. Asked pt to call back to let us know update.   Called mother (on [place]) and LVM asking that either herself or daughter call our office back. Gave [employer] phone number.

## 2019-01-10 NOTE — Telephone Encounter (Signed)
Pt advised.

## 2019-01-16 NOTE — Telephone Encounter (Signed)
Called and spoke with pt. She states her phone has been off and now back on. Scheduled 4 month f/u for 03/29/19 at 10am with Dr. Felecia Shelling. She will call [facility] back at [phone] to r/s her MRI's she missed. She states she called [facility] last week and gave consent for shipment of Tysabri. I relayed to Liane in the infusion suite and Liane will f/u on this to see if med can now be shipped to our office. Advised pt I will call back if anything further needed. She verbalized understanding.

## 2019-01-22 DIAGNOSIS — G35 Multiple sclerosis: Secondary | ICD-10-CM | POA: Diagnosis not present

## 2019-01-22 DIAGNOSIS — Z79899 Other long term (current) drug therapy: Secondary | ICD-10-CM | POA: Diagnosis not present

## 2019-01-22 DIAGNOSIS — Z0000 Encounter for general adult medical examination without abnormal findings: Secondary | ICD-10-CM | POA: Diagnosis not present

## 2019-01-26 ENCOUNTER — Telehealth: Payer: Self-pay | Admitting: Neurology

## 2019-01-26 NOTE — Telephone Encounter (Signed)
Pt has called to inform that for a month she has spots on her left arm that are itchy and she thinks they are from the modafinil (PROVIGIL) 200 MG tablet.  Pt is asking for a call to discuss.  Pt states she may check with her pharmacy to see what they will suggest.  Please call

## 2019-01-29 NOTE — Telephone Encounter (Signed)
Called, LVM for pt letting her know to stop medication for a few days to see if sx clear up. Dr. Felecia Shelling does not think its from modafinil but she should stop to know for sure.

## 2019-01-29 NOTE — Telephone Encounter (Signed)
Probably not from the modafinil.  However, she can stop for a few days to see if the rash improves.

## 2019-01-29 NOTE — Telephone Encounter (Signed)
Dr. Felecia Shelling- Please advise

## 2019-01-30 ENCOUNTER — Telehealth: Payer: Self-pay | Admitting: *Deleted

## 2019-01-30 DIAGNOSIS — G35 Multiple sclerosis: Secondary | ICD-10-CM | POA: Diagnosis not present

## 2019-01-30 NOTE — Telephone Encounter (Signed)
Received fax notification from touch prescribing program that pt authorized for Tysabri from 01/30/19 to 07/31/19. Pt enrollment # [id number]. Account: [employer]. Site auth #: [id number].

## 2019-02-02 ENCOUNTER — Other Ambulatory Visit: Payer: Self-pay

## 2019-02-02 ENCOUNTER — Ambulatory Visit
Admission: RE | Admit: 2019-02-02 | Discharge: 2019-02-02 | Disposition: A | Discharge status: Home / Self Care | Payer: Medicare Other | Source: Ambulatory Visit | Attending: Neurology | Admitting: Neurology

## 2019-02-02 DIAGNOSIS — G35 Multiple sclerosis: Secondary | ICD-10-CM | POA: Diagnosis not present

## 2019-02-02 MED ORDER — GADOBENATE DIMEGLUMINE 529 MG/ML IV SOLN
17.0000 mL | Freq: Once | INTRAVENOUS | Status: AC | PRN
  Administered 2019-02-02: 17 mL via INTRAVENOUS

## 2019-02-05 ENCOUNTER — Telehealth: Payer: Self-pay | Admitting: *Deleted

## 2019-02-05 NOTE — Telephone Encounter (Signed)
Called, LVM for pt about results per Dr. Felecia Shelling note. Gave [facility] phone # if she has further questions.

## 2019-02-05 NOTE — Telephone Encounter (Signed)
-----   Message from Britt Bottom, MD sent at 02/05/2019  3:52 PM EDT ----- Please let her know that the MRI of the brain and spinal cord show many MS lesions.  None of them look to be brand-new.  We do not have the actual images from her last brain and cervical spine MRI (looks like from 2015).  Could you please see if we can get these from [facility]).  Thank you

## 2019-02-12 DIAGNOSIS — G35 Multiple sclerosis: Secondary | ICD-10-CM | POA: Diagnosis not present

## 2019-02-14 ENCOUNTER — Telehealth: Payer: Self-pay | Admitting: *Deleted

## 2019-02-14 NOTE — Telephone Encounter (Signed)
Gave to Dr. Sater to review  

## 2019-02-14 NOTE — Telephone Encounter (Signed)
R/c cd from [facility]. Pt cd on Emma desk 

## 2019-02-19 DIAGNOSIS — E441 Mild protein-calorie malnutrition: Secondary | ICD-10-CM | POA: Diagnosis not present

## 2019-02-19 DIAGNOSIS — G35 Multiple sclerosis: Secondary | ICD-10-CM | POA: Diagnosis not present

## 2019-02-20 ENCOUNTER — Telehealth: Payer: Self-pay | Admitting: Neurology

## 2019-02-20 NOTE — Telephone Encounter (Signed)
Dr. Sater- what would you recommend? 

## 2019-02-20 NOTE — Telephone Encounter (Signed)
Dr. Sater- please advise 

## 2019-02-20 NOTE — Telephone Encounter (Signed)
Pt is asking for a call to discuss what Dr Felecia Shelling thinks about her returning to work for a few hours a week, please call

## 2019-02-20 NOTE — Telephone Encounter (Signed)
Pt has called re: her reaction to the modafinil (PROVIGIL) 200 MG tablet.  Pt states every time she takes the modafinil (PROVIGIL) 200 MG tablet her arm breaks out.  Pt is asking if there is another medication that Dr Felecia Shelling can prescribe.  Please call

## 2019-02-21 MED ORDER — PHENTERMINE HCL 37.5 MG PO CAPS: 37.5000 mg | ORAL_CAPSULE | ORAL | 5 refills | Status: DC

## 2019-02-21 NOTE — Telephone Encounter (Signed)
If she feels she is able to return to part-time work, that should be okay.  She should stop the Provigil.  I sent in a prescription for phentermine to take 1 pill every morning.  Please let her know that this is a stimulant that also can help people lose weight.

## 2019-02-21 NOTE — Addendum Note (Signed)
Addended by: Arlice Colt A on: 02/21/2019 03:55 PM   Modules accepted: Orders

## 2019-02-22 ENCOUNTER — Other Ambulatory Visit: Payer: Self-pay | Admitting: Neurology

## 2019-02-22 ENCOUNTER — Other Ambulatory Visit: Payer: Self-pay | Admitting: *Deleted

## 2019-02-22 ENCOUNTER — Telehealth: Payer: Self-pay | Admitting: Neurology

## 2019-02-22 MED ORDER — AMANTADINE HCL 100 MG PO CAPS: 100.0000 mg | ORAL_CAPSULE | Freq: Two times a day (BID) | ORAL | 11 refills | Status: DC

## 2019-02-22 NOTE — Telephone Encounter (Signed)
Dr. Felecia Shelling- do you have another recommendation besides phentermine?

## 2019-02-22 NOTE — Telephone Encounter (Signed)
Called pt back. Relayed Dr. Garth Bigness recommendation. She is agreeable to stop modafinil and start phentermine. She is aware I will work on PA and update pharmacy/her once we receive determination on this. She verbalized understanding.

## 2019-02-22 NOTE — Telephone Encounter (Signed)
[employer] has called to inform that not even with a PA will phentermine be covered, they can be reached at [phone]

## 2019-02-22 NOTE — Telephone Encounter (Signed)
Pt is calling back about the phentermine.  She states she was told this is not covered by her insurance and that a Prior Authorization is needed

## 2019-02-22 NOTE — Telephone Encounter (Signed)
Lets try amantadine 100 mg po bid  #60   #11

## 2019-02-22 NOTE — Telephone Encounter (Signed)
Called pt. Relayed Dr. Garth Bigness recommendation since phentermine not covered by insurance and unable to complete PA. Pt agreeable to try amantadine instead. I spelled this for her and she wrote it down. Explained directions of 1 tablet twice daily.

## 2019-02-22 NOTE — Addendum Note (Signed)
Addended by: Hope Pigeon on: 02/22/2019 02:44 PM   Modules accepted: Orders

## 2019-02-27 DIAGNOSIS — G35 Multiple sclerosis: Secondary | ICD-10-CM | POA: Diagnosis not present

## 2019-03-05 DIAGNOSIS — L209 Atopic dermatitis, unspecified: Secondary | ICD-10-CM | POA: Diagnosis not present

## 2019-03-05 DIAGNOSIS — Z2829 Immunization not carried out because of patient decision for other reason: Secondary | ICD-10-CM | POA: Diagnosis not present

## 2019-03-05 DIAGNOSIS — G35 Multiple sclerosis: Secondary | ICD-10-CM | POA: Diagnosis not present

## 2019-03-05 DIAGNOSIS — Z0000 Encounter for general adult medical examination without abnormal findings: Secondary | ICD-10-CM | POA: Diagnosis not present

## 2019-03-05 DIAGNOSIS — N329 Bladder disorder, unspecified: Secondary | ICD-10-CM | POA: Diagnosis not present

## 2019-03-12 ENCOUNTER — Telehealth: Payer: Self-pay | Admitting: Neurology

## 2019-03-12 MED ORDER — VITAMIN D (ERGOCALCIFEROL) 1.25 MG (50000 UNIT) PO CAPS: 50000.0000 [IU] | ORAL_CAPSULE | ORAL | 1 refills | Status: DC

## 2019-03-12 NOTE — Telephone Encounter (Signed)
E-scribed rx 

## 2019-03-12 NOTE — Telephone Encounter (Signed)
We can do the weekly script #13 #1

## 2019-03-12 NOTE — Telephone Encounter (Signed)
Dr. Felecia Shelling- are you ok with prescribing the 50,000U weekly based on recent VIT D lab results? Or would you like her to switch to OTC?

## 2019-03-12 NOTE — Telephone Encounter (Signed)
Called, LVM for pt to call office back. Dr. Felecia Shelling ok to refill but I wanted to verify dose she is taking and what pharmacy she wanted prescription to go to

## 2019-03-12 NOTE — Telephone Encounter (Signed)
Pt called and wants to know if now that she is seeing a neurologist at this office will she be able to refill her cholecalciferol (VITAMIN D3) 25 MCG (1000 UT) tablet with this office since she no longer can get it filled by her old Neurologist. Please advise.

## 2019-03-12 NOTE — Telephone Encounter (Signed)
Dosage - 50,000 Units , 1.25mg   Pharmacy - [place], [place]

## 2019-03-27 ENCOUNTER — Telehealth: Payer: Self-pay | Admitting: *Deleted

## 2019-03-27 NOTE — Telephone Encounter (Signed)
Pt cx appt on 03/29/19 at 10am by accident. I r/s her appt and MD approved for her to have infusion after appt. She has transportation set up already.

## 2019-03-29 ENCOUNTER — Ambulatory Visit: Payer: Self-pay | Admitting: Neurology

## 2019-03-29 ENCOUNTER — Ambulatory Visit (INDEPENDENT_AMBULATORY_CARE_PROVIDER_SITE_OTHER): Payer: Medicare Other | Admitting: Neurology

## 2019-03-29 ENCOUNTER — Encounter: Payer: Self-pay | Admitting: Neurology

## 2019-03-29 ENCOUNTER — Other Ambulatory Visit: Payer: Self-pay

## 2019-03-29 VITALS — BP 121/78 | HR 85 | Temp 97.8°F | Ht 62.0 in | Wt 176.5 lb

## 2019-03-29 DIAGNOSIS — G35 Multiple sclerosis: Secondary | ICD-10-CM | POA: Diagnosis not present

## 2019-03-29 DIAGNOSIS — Z993 Dependence on wheelchair: Secondary | ICD-10-CM | POA: Diagnosis not present

## 2019-03-29 DIAGNOSIS — N3941 Urge incontinence: Secondary | ICD-10-CM

## 2019-03-29 DIAGNOSIS — R270 Ataxia, unspecified: Secondary | ICD-10-CM

## 2019-03-29 DIAGNOSIS — R29898 Other symptoms and signs involving the musculoskeletal system: Secondary | ICD-10-CM

## 2019-03-29 DIAGNOSIS — R21 Rash and other nonspecific skin eruption: Secondary | ICD-10-CM | POA: Insufficient documentation

## 2019-03-29 DIAGNOSIS — H5500 Unspecified nystagmus: Secondary | ICD-10-CM

## 2019-03-29 NOTE — Progress Notes (Addendum)
[facility]  PATIENT: Melissa West DOB: 07/04/1990  REFERRING DOCTOR OR PCP:  Nita SellsJohn Hall SOURCE: Patient, notes from primary care, [facility], multiple MRI reports,[id number]  _________________________________   HISTORICAL  CHIEF COMPLAINT:  Chief Complaint  Patient presents with  . Follow-up    RM 13, alone. Last seen 11/29/2018. Has rash that started two months ago on right arm. Sometimes on right side of face. Has seen PCP. Sometimes on her left arm but rare. PCP gave her some cream to put on. She also wants to discuss doing some PT, legs feeling more weak/increased spasms intermittently.  . Multiple Sclerosis    On Tysabri. Last JCV 11/29/18 negative, index: 0.19. She will also have infusion today after appt. Pt comes by transportation.    HISTORY OF PRESENT ILLNESS:   Melissa West is a 28 year old woman with relapsing remitting multiple sclerosis.     Update 03/29/19 Mobility issues: Melissa West is a 28 year old woman with multiple sclerosis who has bilateral leg weakness and spasticity.  She is currently wheelchair-bound due to an inability to walk but the MS related weakness and spasticity.  Her mobility issues prevent her from completing activities of daily living including personal hygiene, meal preparation, light chores..  She is unable to safely ambulate with a walker or cane.  She is able to safely self propel a wheelchair.  She is unable to safely operate a wheelchair weighing more than 36 pounds.  MS: She is on Tysabri for MS.   JCV Ab 11/29/18 was negative at 0.19.  Next infusion is today.   She is tolerating it well and has no exacerbation.  She has bilateral leg weakness, a little weaker on right, with spasticity.     She has no numbness.   She notes mild clumsiness in hands but can eat/write/type ok.    She has   She has had a rash that is itchy x 2 months on her arms (R> L) and mildly right face. It is not  erythematous.         We started Vesicare for urinary urgency at the last visit.  She felt it did help her spme but she stopped when the 30 days ran out.   She would like to get back on.   Since the last visit she was placed on modafinil, then phentermine then amantadine for MS fatigue.   Modafinil and phentermine were poorly tolerated.    A rash started after modafinil.       From initial evlauation 11/29/2018: She is a 28 year old woman who was diagnosed with MS about 11 years ago.  At age 28, she had some falls due to poor balance not weakness.   A few months later, she had MRI's performed consistent with MS.  She was placed on Betaseron initially.    She did well for a while and was walking without an aid.  She returned to work.    She thinks she stopped the Betaseron and then had more symptoms including poor gait and diplopia.   She started Tysabri but stopped it during pregnancy.   She had a period of time (2013-2014) without any medications (she was still doing relatively well then).    She had more trouble with her right leg with weakness and spasticity and returned to [facility] (Dr. Renne CriglerPharr) in 2014.   She was placed back on Tysabri and stayed on until October 2016.   JCV Ab was 0.23 indeterminate 12/2014.  She then stopped the Tysabri and started to see Dr. Gerilyn Pilgrim.   She started Tecfidera but she thinks she was worsening.  In 2018, she had a positive JCV Ab titer (0.44) and a decision was made to swtich to Ocrevus.    About 2 years ago, she went on Ocrevus (thinks only 2 doses).  She hasn't seen any neurologist since and she feels she has progressed a lot.     Currently she is wheelchair bound.    She was able to walk some without a cane in 2016 and steadily worsened after she stopped Tysabri.    She can move legs bit not lift up against gravity.  Balance is very poor.  She has no arm weakness but has poor coordination in the arms.    She denies diplopia but has a dysconjugate gaze.    Vision is reduced  even with glasses.    She has urinary frequency and urge incontinence.    She thinks she empties.  She feels cognition is baseline.   She denies depression and anxiety.     She is always tired and sleepy.  She sleeps excessively.    She snores some but has never been told there are pauses or gasps.    Labs able to review some old imaging studies.  A CT scan of the head 07/02/2012 showed white matter lesions consistent with MS that were not present in 2009.  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  MRI of the lumbar spine 10/31/2012 showed demyelinating plaque in the lower thoracic spinal cord and conus medullaris.  MRI report 12/28/2013 of the brain (compared to 2011 )states: Interval disease progression indicated by increased number of innumerable T2 hyperintense lesions within the periventricular and subcortical white matter involving both cerebral hemispheres, with additional foci in the cerebellum and brainstem. No enhancing lesions or restricted diffusion.  MRI cervical spine report 04/12/2013 (compared to 2011) shows: Extensive signal abnormality within the brainstem and cervical spinal cord in keeping with multiple sclerosis. There has been interval progression of disease within the brainstem and possibly at C3-4 within the spinal cord. No enhancement seen on today's exam.   REVIEW OF SYSTEMS: Constitutional: No fevers, chills, sweats, or change in appetite.  She has excessive sleepiness.   Eyes: No visual changes, double vision, eye pain Ear, nose and throat: No hearing loss, ear pain, nasal congestion, sore throat Cardiovascular: No chest pain, palpitations Respiratory: No shortness of breath at rest or with exertion.   She snores but reports that a sleep study did not show sleep apnea.   GastrointestinaI: No nausea, vomiting, diarrhea, abdominal pain, fecal incontinence Genitourinary: No dysuria, urinary retention or frequency.  No  nocturia. Musculoskeletal: No neck pain, back pain Integumentary: No rash, pruritus, skin lesions Neurological: as above Psychiatric: No depression at this time.  No anxiety Endocrine: No palpitations, diaphoresis, change in appetite, change in weigh or increased thirst Hematologic/Lymphatic: No anemia, purpura, petechiae. Allergic/Immunologic: No itchy/runny eyes, nasal congestion, recent allergic reactions, rashes  ALLERGIES: No Known Allergies  HOME MEDICATIONS:  Current Outpatient Medications:  .  amantadine (SYMMETREL) 100 MG capsule, Take 1 capsule (100 mg total) by mouth 2 (two) times daily., Disp: 60 capsule, Rfl: 11 .  natalizumab (TYSABRI) 300 MG/15ML injection, Inject into the vein., Disp: , Rfl:  .  solifenacin (VESICARE) 10 MG tablet, Take 1 tablet (10 mg total) by mouth daily., Disp: 30 tablet, Rfl: 11 .  triamcinolone (KENALOG) 0.025 %  cream, Apply 1 application topically as needed., Disp: , Rfl:  .  Vitamin D, Ergocalciferol, (DRISDOL) 1.25 MG (50000 UT) CAPS capsule, Take 1 capsule (50,000 Units total) by mouth every 7 (seven) days., Disp: 13 capsule, Rfl: 1  PAST MEDICAL HISTORY: Past Medical History:  Diagnosis Date  . MS (multiple sclerosis) (HCC)   . No pertinent past medical history     PAST SURGICAL HISTORY: Past Surgical History:  Procedure Laterality Date  . CESAREAN SECTION  08/09/2011   Procedure: CESAREAN SECTION;  Surgeon: Lesly DukesKelly H Leggett, MD;  Location: [facility];  Service: Gynecology;  Laterality: N/A;    FAMILY HISTORY: Family History  Problem Relation Age of Onset  . Diabetes Maternal Grandmother   . Hypertension Maternal Grandmother   . Kidney disease Maternal Grandmother     SOCIAL HISTORY:  Social History   Socioeconomic History  . Marital status: Single    Spouse name: Not on file  . Number of children: Not on file  . Years of education: Not on file  . Highest education level: Not on file  Occupational History  . Not on file   Tobacco Use  . Smoking status: Never Smoker  . Smokeless tobacco: Never Used  Substance and Sexual Activity  . Alcohol use: No  . Drug use: No  . Sexual activity: Yes    Birth control/protection: None  Other Topics Concern  . Not on file  Social History Narrative   Left handed    Lives with kid   Caffeine use: none   Social Determinants of Health   Financial Resource Strain:   . Difficulty of Paying Living Expenses: Not on file  Food Insecurity:   . Worried About Programme researcher, broadcasting/film/videounning Out of Food in the Last Year: Not on file  . Ran Out of Food in the Last Year: Not on file  Transportation Needs:   . Lack of Transportation (Medical): Not on file  . Lack of Transportation (Non-Medical): Not on file  Physical Activity:   . Days of Exercise per Week: Not on file  . Minutes of Exercise per Session: Not on file  Stress:   . Feeling of Stress : Not on file  Social Connections:   . Frequency of Communication with Friends and Family: Not on file  . Frequency of Social Gatherings with Friends and Family: Not on file  . Attends Religious Services: Not on file  . Active Member of Clubs or Organizations: Not on file  . Attends BankerClub or Organization Meetings: Not on file  . Marital Status: Not on file  Intimate Partner Violence:   . Fear of Current or Ex-Partner: Not on file  . Emotionally Abused: Not on file  . Physically Abused: Not on file  . Sexually Abused: Not on file     PHYSICAL EXAM  Vitals:   03/29/19 1007  BP: 121/78  Pulse: 85  Temp: 97.8 F (36.6 C)  SpO2: 95%  Weight: 176 lb 8 oz (80.1 kg)  Height: 5\' 2"  (1.575 m)    Body mass index is 32.28 kg/m.   General: The patient is well-developed and well-nourished and in no acute distress  HEENT:  Head is [place]/AT.  Sclera are anicteric.    Skin: Extremities are without rash or  edema.  Musculoskeletal:  Back is nontender  Neurologic Exam  Mental status: The patient is alert and oriented x 3 at the time of the  examination. The patient has apparent normal recent and remote memory, with an  apparently normal attention span and concentration ability.   Speech is normal.  Cranial nerves: She has a dysconjugate gaze with left exotropia.  She has nystagmus with all gazes..   There is good facial sensation to soft touch bilaterally.Facial strength is normal.  Trapezius and sternocleidomastoid strength is normal. No dysarthria is noted.   No obvious hearing deficits are noted.  Motor:  Muscle bulk is normal.   Tone is increased in the legs.  Strength was normal in the arms though rapid rotating movements were mildly reduced on the left.  Strength was 2- to2/5 in the legs.  Sensory: Sensory testing is intact to pinprick, soft touch and vibration sensation in the arms but reduced vibration in the legs.  Coordination: Cerebellar testing reveals very ataxic finger-nose-finger bilaterally, a little worse on right.  She is unable to move her legs well enough to test.  Gait and station: She is wheelchair-bound and cannot stand even with strong support.  Romberg is negative.   Reflexes: Deep tendon reflexes are increased.  In the legs, there is spread at the knees and clonus at the ankles.  Plantar responses are extensor.      ASSESSMENT AND PLAN  1. MS (multiple sclerosis) ([place])   2. Wheelchair bound   3. Leg weakness, bilateral   4. Ataxia   5. Nystagmus   6. Urinary incontinence, urge   7. Rash     1.   The patient requires a light weight wheelchair due to leg weakness and spasticity from multiple sclerosis.  Mobility limitations prevent her from completing activities of daily living.  She is unable to safely ambulate with an assistive device such as a walker, cane or crutch.  She is able to safely self propel a wheelchair.  However, she cannot safely operate a wheelchair that weighs greater than 36 pounds. 2.   continue Tysabri.  Will check JCV Ab, CBC/D at next visit.    3.   Renew Vesicare 4.   Refer  to PT for exercise program and improve transfers 5.   Refer to dermatology for rash over last 3 months.   6.   Stay active and try to exercise 7.   rtc 4 months, sooner if new pr worsening issues  Shayana Hornstein A. Felecia Shelling, MD, [facility] 82/99/3716, 96:78 AM Certified in Neurology, Clinical Neurophysiology, Sleep Medicine and Neuroimaging  [facility] [address] [place], [place] [address] [phone]

## 2019-04-09 ENCOUNTER — Telehealth: Payer: Self-pay | Admitting: Neurology

## 2019-04-09 NOTE — Telephone Encounter (Signed)
Danielle or Hinton Dyer, can you follow up on this with her? Thank you

## 2019-04-09 NOTE — Telephone Encounter (Signed)
Patient called to check on which office she was referred to for her dermatology referral. Please follow up

## 2019-04-11 NOTE — Telephone Encounter (Signed)
Patient is scheduled in Feb. Patient is aware.

## 2019-04-20 ENCOUNTER — Other Ambulatory Visit: Payer: Self-pay

## 2019-04-20 ENCOUNTER — Ambulatory Visit: Payer: Medicare Other | Attending: Neurology | Admitting: Physical Therapy

## 2019-04-20 ENCOUNTER — Encounter: Payer: Self-pay | Admitting: Physical Therapy

## 2019-04-20 DIAGNOSIS — R2681 Unsteadiness on feet: Secondary | ICD-10-CM | POA: Insufficient documentation

## 2019-04-20 DIAGNOSIS — R29818 Other symptoms and signs involving the nervous system: Secondary | ICD-10-CM | POA: Diagnosis not present

## 2019-04-20 DIAGNOSIS — M6281 Muscle weakness (generalized): Secondary | ICD-10-CM | POA: Insufficient documentation

## 2019-04-20 NOTE — Therapy (Signed)
[facility] [address] [place], [place], [address] Phone: [phone]   Fax:  [phone]  Physical Therapy Evaluation  Patient Details  Name: Melissa West MRN: [id number] Date of Birth: 1990-06-13 Referring Provider (PT): Sater   Encounter Date: 04/20/2019  PT End of Session - 04/20/19 1116    Visit Number  1    Number of Visits  13    Date for PT Re-Evaluation  07/19/19   90 day perior for 8 wk POC   Authorization Type  [employer] Medicare/Medicaid-10th Visit progress note required    PT Start Time  1010    PT Stop Time  1103    PT Time Calculation (min)  53 min    Equipment Utilized During Treatment  Gait belt    Activity Tolerance  Patient tolerated treatment well    Behavior During Therapy  [facility] for tasks assessed/performed       Past Medical History:  Diagnosis Date  . MS (multiple sclerosis) (HCC)   . No pertinent past medical history     Past Surgical History:  Procedure Laterality Date  . CESAREAN SECTION  08/09/2011   Procedure: CESAREAN SECTION;  Surgeon: Lesly Dukes, MD;  Location: [facility];  Service: Gynecology;  Laterality: N/A;    There were no vitals filed for this visit.   Subjective Assessment - 04/20/19 1011    Subjective  I'm trying to get out of the wheelchair; to improve balance and being able to stand.  It's been a couple of years since I have walked.  Able to stand, but need to hold onto something; able to trasnfer on my own.  Reports sitting in her wheelchair most of the day; no cushion, no reports of pressure sores.    Limitations  Standing;Walking    How long can you stand comfortably?  several minutes    Patient Stated Goals  To be able to get out of the w/c more, to work on balance and standing.    Currently in Pain?  No/denies         [facility] PT Assessment - 04/20/19 1019      Assessment   Medical Diagnosis  MS    Referring Provider (PT)  Sater    Onset Date/Surgical  Date  03/29/19    Hand Dominance  Left      Precautions   Precautions  Fall    Required Braces or Orthoses  --   Has AFO (?) BLEs; has never worn     Balance Screen   Has the patient fallen in the past 6 months  No    Has the patient had a decrease in activity level because of a fear of falling?   Yes    Is the patient reluctant to leave their home because of a fear of falling?   --   Maybe     Home Environment   Living Environment  Private residence    Living Arrangements  Children;Other (Comment)   7 yo   Available Help at Discharge  Family;Personal care attendant   Daily   Type of Home  House    Home Access  Ramped entrance    Home Layout  One level    Home Equipment  Wheelchair - manual;Walker - 2 wheels;Bedside commode;Tub bench   Tub being replaced to roll-in shower     Prior Function   Level of Independence  --   Occasionally needs assist with ADLs   Vocation  On disability    Leisure  Enjoys being outside      Sensation   Light Touch  Impaired by gross assessment   Light touch to BLES-pt inconsistent above knees     Coordination   Gross Motor Movements are Fluid and Coordinated  No   Decreased trunk control with UE ROM and MMT     Posture/Postural Control   Posture/Postural Control  Postural limitations    Postural Limitations  Anterior pelvic tilt;Flexed trunk   in standing   Posture Comments  Decreased trunk strength noted with UE movements and ataxic trunk movements with transfers      Tone   Assessment Location  Right Lower Extremity;Left Lower Extremity      ROM / Strength   AROM / PROM / Strength  PROM      PROM   PROM Assessment Site  Ankle    Right/Left Ankle  Right;Left    Right Ankle Dorsiflexion  5    Left Ankle Dorsiflexion  7      Transfers   Transfers  Squat Pivot Transfers    Squat Pivot Transfers  3: Mod assist;2: Max assist   W/C to mat   Squat Pivot Transfer Details (indicate cue type and reason)  Transfer in restroom w/c to  toilet:  pt requests that she complete transfer without assistance.  PT provides min guard for w/c to toilet transfer, with pt using both rails surrounding toilet.      Ambulation/Gait   Ambulation/Gait  No      Balance   Balance Assessed  Yes      Static Standing Balance   Static Standing - Balance Support  Bilateral upper extremity supported   Standing at sink, w/c behind patient   Static Standing - Level of Assistance  5: Stand by assistance;4: Min assist    Static Standing - Comment/# of Minutes  50 sec      RLE Tone   RLE Tone  Moderate      LLE Tone   LLE Tone  Moderate       Sit<>Stand from w/c to sink:  Pt requires moderate assistance to stand; pt able to manage BLEs and place feet fully on floor.          Objective measurements completed on examination: See above findings.              PT Education - 04/20/19 1115    Education Details  Discussed results of eval, POC    Person(s) Educated  Patient    Methods  Explanation    Comprehension  Verbalized understanding       PT Short Term Goals - 04/20/19 1135      PT SHORT TERM GOAL #1   Title  Pt will perform HEP with supervision, for improved trunk and lower extremity strength, balance, standing tolerance.  TARGET 05/25/2019    Time  4    Period  Weeks    Status  New    Target Date  05/25/19      PT SHORT TERM GOAL #2   Title  Patient will perform squat pivot transfers with min assist to demosntrate improved ability to initiate transfer.     Time  4    Period  Weeks    Status  New    Target Date  05/25/19      PT SHORT TERM GOAL #3   Title  Pt will perform standing at sink with UE support with supervision,  for at least 5 minutes, for improved standing tolerance for ADLs.    Time  4    Period  Weeks    Status  New    Target Date  05/25/19        PT Long Term Goals - 04/20/19 1140      PT LONG TERM GOAL #1   Title  Pt will perform updated and progressive HEP for improved transfers,  strength, flexibility, with supervision.  TARGET 06/22/2019    Time  8    Period  Weeks    Status  New      PT LONG TERM GOAL #2   Title  Pt will perform squat pivot transfers with supervision, for improved transfer safety and efficiency.    Time  8    Period  Weeks    Status  New      PT LONG TERM GOAL #3   Title  Pt will perform standing activities at sink with 1-2 UE support, with supervision, for improved standing balance and participation in ADLs.    Time  8    Period  Weeks    Status  New      PT LONG TERM GOAL #4   Title  Pt will perform sit to stand transfer with supervision for improved transfers in preparation for ADL participation/gait prep as tolerated.    Time  8    Period  Weeks    Status  New             Plan - 04/20/19 1122    Clinical Impression Statement  Pt is a 29 year old female who presents to OPPT with history of MS, several years since pt was ambulatory.  She is referred to PT for exercise program and transfer training.  She presents with decreased trunk and lower extremity strength, decreased flexibility and ROM, abnormal tone, dependence in trasnfers, decreased standing tolerance and decreased standing balance, decreased safety awareness.  Pt has not been ambulatory in at least 3 years (per previous PT note from 2017), she used manual w/c for primary means of mobility at this time.  She would benefit from skilled PT to address the above stated deficits to decrease fall risk and improve overall funcitonal mobility and independence.    Personal Factors and Comorbidities  Comorbidity 2;Time since onset of injury/illness/exacerbation    Comorbidities  nystagmus, ataxia; severity of MS progression    Examination-Activity Limitations  Bathing;Bed Mobility;Caring for Others;Dressing;Transfers;Sit;Stand;Toileting    Examination-Participation Restrictions  Driving;Other;Meal Prep   Caring for son (33 yo)   Stability/Clinical Decision Making  Evolving/Moderate  complexity    Clinical Decision Making  Moderate    Rehab Potential  Good   for goals established in POC   PT Frequency  Other (comment)   1x/wk for 4 weeks, then 2x/wk for 4 weeks   PT Duration  8 weeks   8 weeks total POC, plus eval   PT Treatment/Interventions  ADLs/Self Care Home Management;Aquatic Therapy;Neuromuscular re-education;Balance training;Therapeutic exercise;Therapeutic activities;Functional mobility training;Patient/family education;Orthotic Fit/Training    PT Next Visit Plan  Initiate HEP to include trunk strengthening, lower extremity flexibility/ROM; try supine exercises for hip strengthening (she may have CNA who can help with her HEP); standing at sink or parallel bars; use of aerobic machines for lower extremity flexibility/strength    Recommended Other Services  Need for equipment follow-up for wheelchair (in disrepair)-[employer]    Consulted and Agree with Plan of Care  Patient  Patient will benefit from skilled therapeutic intervention in order to improve the following deficits and impairments:  Decreased coordination, Decreased range of motion, Impaired tone, Decreased safety awareness, Decreased activity tolerance, Decreased balance, Impaired flexibility, Decreased mobility, Decreased strength  Visit Diagnosis: Muscle weakness (generalized)  Unsteadiness on feet  Other symptoms and signs involving the nervous system     Problem List Patient Active Problem List   Diagnosis Date Noted  . Rash 03/29/2019  . Wheelchair bound 11/29/2018  . Ataxia 11/29/2018  . Nystagmus 11/29/2018  . Urinary incontinence, urge 11/29/2018  . MS (multiple sclerosis) (HCC)   . Neurological deficit present 11/11/2010    Melissa West W. 04/20/2019, 11:52 AM  Gean Maidens., PT   [facility] [address] [place], [place], [address] Phone: [phone]   Fax:  [phone]  Name: Melissa West MRN: [id number] Date of Birth: [birth year]

## 2019-05-03 DIAGNOSIS — G35 Multiple sclerosis: Secondary | ICD-10-CM | POA: Diagnosis not present

## 2019-05-04 ENCOUNTER — Other Ambulatory Visit: Payer: Self-pay

## 2019-05-04 ENCOUNTER — Encounter: Payer: Self-pay | Admitting: Physical Therapy

## 2019-05-04 ENCOUNTER — Ambulatory Visit: Payer: Medicare Other | Admitting: Physical Therapy

## 2019-05-04 DIAGNOSIS — R29818 Other symptoms and signs involving the nervous system: Secondary | ICD-10-CM | POA: Diagnosis not present

## 2019-05-04 DIAGNOSIS — R2681 Unsteadiness on feet: Secondary | ICD-10-CM | POA: Diagnosis not present

## 2019-05-04 DIAGNOSIS — M6281 Muscle weakness (generalized): Secondary | ICD-10-CM | POA: Diagnosis not present

## 2019-05-04 NOTE — Therapy (Signed)
[facility] [address] [place], [place], [address] Phone: [phone]   Fax:  [phone]  Physical Therapy Treatment  Patient Details  Name: Melissa West MRN: [id number] Date of Birth: 1990-11-16 Referring Provider (PT): Sater   Encounter Date: 05/04/2019  PT End of Session - 05/04/19 1251    Visit Number  2    Number of Visits  13    Date for PT Re-Evaluation  07/19/19   90 day perior for 8 wk POC   Authorization Type  [employer] Medicare/Medicaid-10th Visit progress note required    PT Start Time  1018    PT Stop Time  1118    PT Time Calculation (min)  60 min    Equipment Utilized During Treatment  Gait belt    Activity Tolerance  Patient tolerated treatment well    Behavior During Therapy  [facility] for tasks assessed/performed       Past Medical History:  Diagnosis Date  . MS (multiple sclerosis) ([place])   . No pertinent past medical history     Past Surgical History:  Procedure Laterality Date  . CESAREAN SECTION  08/09/2011   Procedure: CESAREAN SECTION;  Surgeon: Guss Bunde, MD;  Location: [facility];  Service: Gynecology;  Laterality: N/A;    There were no vitals filed for this visit.  Subjective Assessment - 05/04/19 1226    Subjective  Pt denies any falls or changes since last visit.  Pt brings in photo of her braces as instructed by PT last session.  Pt states that she does not need her aid to help her with exercises.    Limitations  Standing;Walking    How long can you stand comfortably?  several minutes    Patient Stated Goals  To be able to get out of the w/c more, to work on balance and standing.    Currently in Pain?  No/denies         [facility] Adult PT Treatment/Exercise - 05/04/19 0001      Bed Mobility   Bed Mobility  Sit to Supine;Supine to Sit;Sitting - Scoot to Edge of Bed    Supine to Sit  Moderate Assistance - Patient 50-74%   Pt dependent with bil LE's onto/off of mat   Sitting -  Scoot to Edge of Bed  Minimal Assistance - Patient > 75%    Sit to Supine  Moderate Assistance - Patient 50-74%      Transfers   Transfers  Squat Pivot Transfers    Squat Pivot Transfers  3: Mod assist    Squat Pivot Transfer Details (indicate cue type and reason)  w/c<>mat.  Pt stated she could perform without assistance yet going to mat LE's have increased extensor tone and pt unable to completely pivot to mat.  Going back to w/c pt missed seat and landed on armrest then had to complete rest of transfer.  Brakes do not lock on one side of pt's w/c.        Ambulation/Gait   Ambulation/Gait  No      Chief Technology Officer  Yes    Wheelchair Assistance  6: Modified independent (Device/Increase time)    Orthoptist;Other (comment)    Distance  110   x2   Comments  pt has significant difficulty lifting LE's off leg rests to move out of way for transfer.   Pt uses pants to try and lift legs.  Posture/Postural Control   Posture/Postural Control  Postural limitations    Postural Limitations  Anterior pelvic tilt;Flexed trunk    Posture Comments  Decreased trunk strength noted with UE movements and ataxic trunk movements with transfers;uses UE's to balance at edge of mat and decreased balance without UE assist.      Balance   Balance Assessed  Yes      Static Sitting Balance   Static Sitting - Balance Support  Bilateral upper extremity supported;Feet supported    Static Sitting - Level of Assistance  5: Stand by assistance    Static Sitting - Comment/# of Minutes  seated edge of mat for exercise and needing UE assist.  Unsteady without UE assist.      Self-Care   Self-Care  Other Self-Care Comments    Other Self-Care Comments   Along with Lonia Blood, PT, discussed contacting MD concerning w/c evaluation for new w/c and cushion.  Pt's w/c in despair with R brake not working at all , L brake partially holding, armrests torn, seat with  significant wear and slack, etc...along with pt not currently using a cushion.  States she has a cushion at home but it pushes her forward in chair and she feels like she's going to slide out with cushion.  Pt agreeable to w/c evaluation.  Also discussed pt's braces.  She has bilateral metal upright AFO's that she reports never wearing and states she was never taught how to wear/use/walk with.  Pt expresses concern over how heavy they are and does not feel like she could bring them into therapy session.  Discussed developing HEP and asked pt if aid would be able to help.  Pt states that she does not need any help with exercises and can do on her own (despite to with little active ROM during session).  Pt reports that she does an exercises video in her w/c but it unable to recall name or what exercises she performs with it.  Discussed option of leg loops to assist with LE mobility and explained how they would work.  Pt agreeable to measure for loops.      Exercises   Exercises  Knee/Hip;Lumbar      Lumbar Exercises: Seated   Other Seated Lumbar Exercises  Anterior/posterior pelvic tilts x 10 with PTA providing tactile and manual cues then pt able to initiate x 10 without cues      Knee/Hip Exercises: Seated   Long Arc Quad  Both;AAROM;5 reps;Limitations    Long Arc Quad Limitations  pt with -40 degrees of active extension    Heel Slides  Limitations    Heel Slides Limitations  pt with no active ROM    Ball Squeeze  trace movement x 5 reps      Knee/Hip Exercises: Supine   Heel Slides  PROM;Both;10 reps;Limitations    Heel Slides Limitations  pt unable to initiate due to extensor tone    Hip Adduction Isometric  AAROM;5 reps;Limitations    Hip Adduction Isometric Limitations  pt with trace initiation    Bridges  Limitations;Other (comment)    Bridges Limitations  attempted bridging but pt unable to perform.  Dependent to bend bil LE's into hooklying    Knee Flexion  Both;PROM;10 reps;2  sets;Limitations    Knee Flexion Limitations  increased extensor tone and pt unable to actively initiate any flexion at hip or knee    Other Supine Knee/Hip Exercises  in hooklying trying to have pt keep bil legs in  position to work hip abd/add but trace active movement    Other Supine Knee/Hip Exercises  bil ankle dorsiflexion stretch x 20 sec x 2.  Pt with not active dorsiflexion.  Pt was able to actively plantar flex             PT Education - 05/04/19 1250    Education Details  PT to contact md regarding w/c evaluation, measuring for leg loops next visit, HEP    Person(s) Educated  Patient    Methods  Explanation;Demonstration    Comprehension  Verbalized understanding;Need further instruction       PT Short Term Goals - 04/20/19 1135      PT SHORT TERM GOAL #1   Title  Pt will perform HEP with supervision, for improved trunk and lower extremity strength, balance, standing tolerance.  TARGET 05/25/2019    Time  4    Period  Weeks    Status  New    Target Date  05/25/19      PT SHORT TERM GOAL #2   Title  Patient will perform squat pivot transfers with min assist to demosntrate improved ability to initiate transfer.     Time  4    Period  Weeks    Status  New    Target Date  05/25/19      PT SHORT TERM GOAL #3   Title  Pt will perform standing at sink with UE support with supervision, for at least 5 minutes, for improved standing tolerance for ADLs.    Time  4    Period  Weeks    Status  New    Target Date  05/25/19        PT Long Term Goals - 04/20/19 1140      PT LONG TERM GOAL #1   Title  Pt will perform updated and progressive HEP for improved transfers, strength, flexibility, with supervision.  TARGET 06/22/2019    Time  8    Period  Weeks    Status  New      PT LONG TERM GOAL #2   Title  Pt will perform squat pivot transfers with supervision, for improved transfer safety and efficiency.    Time  8    Period  Weeks    Status  New      PT LONG TERM  GOAL #3   Title  Pt will perform standing activities at sink with 1-2 UE support, with supervision, for improved standing balance and participation in ADLs.    Time  8    Period  Weeks    Status  New      PT LONG TERM GOAL #4   Title  Pt will perform sit to stand transfer with supervision for improved transfers in preparation for ADL participation/gait prep as tolerated.    Time  8    Period  Weeks    Status  New            Plan - 05/04/19 1252    Clinical Impression Statement  Pt with little active LE movement and increased extensor tone in LE's during session today.  Pt continues with decreased safety awareness. Pt appears to have "disconnect" with perception of mobility/movement and her actualy ability.  Continue PT per POC.    Personal Factors and Comorbidities  Comorbidity 2;Time since onset of injury/illness/exacerbation    Comorbidities  nystagmus, ataxia; severity of MS progression    Examination-Activity Limitations  Bathing;Bed Mobility;Caring for Others;Dressing;Transfers;Sit;Stand;Toileting  Examination-Participation Restrictions  Driving;Other;Meal Prep   Caring for son (69 yo)   Stability/Clinical Decision Making  Evolving/Moderate complexity    Rehab Potential  Good   for goals established in POC   PT Frequency  Other (comment)   1x/wk for 4 weeks, then 2x/wk for 4 weeks   PT Duration  8 weeks   8 weeks total POC, plus eval   PT Treatment/Interventions  ADLs/Self Care Home Management;Aquatic Therapy;Neuromuscular re-education;Balance training;Therapeutic exercise;Therapeutic activities;Functional mobility training;Patient/family education;Orthotic Fit/Training    PT Next Visit Plan  Measure of leg loops and contact rehab regarding making them.  Contact MD concerning referal for w/c evaluation.  Review HEP given 1/22 and add further trunk strengthening, lower extremity flexibility/ROM; try supine exercises for hip strengthening (she may have CNA who can help with her  HEP); standing at sink or parallel bars; use of aerobic machines for lower extremity flexibility/strength    PT Home Exercise Plan  Access Code: [email or web address] URL: [email or web address]    Consulted and Agree with Plan of Care  Patient       Patient will benefit from skilled therapeutic intervention in order to improve the following deficits and impairments:  Decreased coordination, Decreased range of motion, Impaired tone, Decreased safety awareness, Decreased activity tolerance, Decreased balance, Impaired flexibility, Decreased mobility, Decreased strength  Visit Diagnosis: Muscle weakness (generalized)  Other symptoms and signs involving the nervous system     Problem List Patient Active Problem List   Diagnosis Date Noted  . Rash 03/29/2019  . Wheelchair bound 11/29/2018  . Ataxia 11/29/2018  . Nystagmus 11/29/2018  . Urinary incontinence, urge 11/29/2018  . MS (multiple sclerosis) ([facility])   . Neurological deficit present 11/11/2010    Newell Coral, PTA [facility] 05/04/19 12:58 PM Phone: [phone] Fax: [phone]   [facility] [address] [place], [place], [address] Phone: [phone]   Fax:  [phone]  Name: Melissa West MRN: [id number] Date of Birth: 05/16/1990

## 2019-05-04 NOTE — Patient Instructions (Signed)
Access Code: [email or web address]  URL: [email or web address]  Date: 05/04/2019  Prepared by: Modena Morrow   Exercises  Seated Pelvic Tilt - 10 reps - 2 sets - 2x daily - 7x weekly  Seated Hip Adduction Isometrics with Ball - 10 reps - 2 sets - 2x daily - 7x weekly

## 2019-05-11 ENCOUNTER — Ambulatory Visit: Payer: Medicare Other | Admitting: Physical Therapy

## 2019-05-11 ENCOUNTER — Telehealth: Payer: Self-pay | Admitting: Physical Therapy

## 2019-05-11 NOTE — Telephone Encounter (Signed)
Dr. Epimenio Foot, Ms. Brindisi has been seen for PT eval and one additional visit so far.  It appears that her manual wheelchair is in disrepair and she may be able to benefit from custom seating/custom wheelchair evaluation.  It is unclear to me from her history if she currently has a usable power wheelchair and/or appropriate wheelchair cushion for her manual wheelchair.   If you agree with w/c evaluation, could you please write order for wheelchair evaluation via Epic?  Once received, I will secure vendor for completion of w/c eval recommendations.  Thank you.  Lonia Blood, PT 05/11/19 9:08 AM Phone: [phone] Fax: [phone]

## 2019-05-18 ENCOUNTER — Encounter: Payer: Self-pay | Admitting: Physical Therapy

## 2019-05-18 ENCOUNTER — Other Ambulatory Visit: Payer: Self-pay

## 2019-05-18 ENCOUNTER — Ambulatory Visit: Payer: Medicare Other | Attending: Neurology | Admitting: Physical Therapy

## 2019-05-18 DIAGNOSIS — M6281 Muscle weakness (generalized): Secondary | ICD-10-CM | POA: Diagnosis not present

## 2019-05-18 DIAGNOSIS — R29818 Other symptoms and signs involving the nervous system: Secondary | ICD-10-CM | POA: Diagnosis not present

## 2019-05-18 NOTE — Therapy (Signed)
[facility] [address] [place], [place], [address] Phone: [phone]   Fax:  [phone]  Physical Therapy Treatment  Patient Details  Name: Melissa West MRN: [id number] Date of Birth: 11/06/1990 Referring Provider (PT): Sater   Encounter Date: 05/18/2019  PT End of Session - 05/18/19 1548    Visit Number  3    Number of Visits  13    Date for PT Re-Evaluation  07/19/19   90 day perior for 8 wk POC   Authorization Type  UHC Medicare/Medicaid-10th Visit progress note required    PT Start Time  1018    PT Stop Time  1103    PT Time Calculation (min)  45 min    Equipment Utilized During Treatment  Gait belt    Activity Tolerance  Patient tolerated treatment well    Behavior During Therapy  [facility] for tasks assessed/performed       Past Medical History:  Diagnosis Date  . MS (multiple sclerosis) ([facility])   . No pertinent past medical history     Past Surgical History:  Procedure Laterality Date  . CESAREAN SECTION  08/09/2011   Procedure: CESAREAN SECTION;  Surgeon: Lesly Dukes, MD;  Location: [facility];  Service: Gynecology;  Laterality: N/A;    There were no vitals filed for this visit.  Subjective Assessment - 05/18/19 1022    Subjective  Pt denies falls or changes since last visit.  Asking about a w/c where the wheels can be removed.  Tried exercises given last session.    Limitations  Standing;Walking    How long can you stand comfortably?  several minutes    Patient Stated Goals  To be able to get out of the w/c more, to work on balance and standing.    Currently in Pain?  No/denies        [facility] Adult PT Treatment/Exercise - 05/18/19 0001      Bed Mobility   Bed Mobility  Sit to Supine;Supine to Sit;Sitting - Scoot to Edge of Bed    Supine to Sit  Moderate Assistance - Patient 50-74%    Sitting - Scoot to Edge of Bed  Moderate Assistance - Patient 50-74%    Sit to Supine  Moderate Assistance -  Patient 50-74%      Transfers   Transfers  Squat Pivot Transfers    Squat Pivot Transfers  3: Mod assist    Squat Pivot Transfer Details (indicate cue type and reason)  w/c>mat with pt using PTA's arm to simulate her bar at home.  Poor pivot of LE's and goes into bil LE extensor tone with transfer causing pt to see very close to edge of mat.      Transfer via Probation officer    Comments  Used Stedy for transfer back to w/c after standing activities.  Pt min-mod assist to scoot back on mat as well as forward to edge.        Ambulation/Gait   Ambulation/Gait  No    Pre-Gait Activities  Standing at [facility] to work on LE weight bearing and posture and strengthening.  Pt needed max assist to get feet positioned on stedy and mod-max assist to come up to standing at stedy by pulling on bar and pushing off mat with other hand.  Pt leaning with bil UE's onto bar once in standing to support self.  Worked on posture and decreasing WB thru forearms on bar.  Pt stood x  90 sec x 3 times.  Pt tends to keep L knee in hyperextension and R knee held in flexion.      Medical sales representative  Yes    Wheelchair Assistance  6: Modified independent (Device/Increase time)    Wheelchair Parts Management  Other (comment);Independent   parts management   Distance  110   x2   Comments  pt continues to have difficulty manipulating legs rests, her legs and brakes to prepare for transfer.      Static Sitting Balance   Static Sitting - Balance Support  Bilateral upper extremity supported;Feet supported    Static Sitting - Level of Assistance  5: Stand by assistance    Static Sitting - Comment/# of Minutes  seated edge of mat               PT Short Term Goals - 04/20/19 1135      PT SHORT TERM GOAL #1   Title  Pt will perform HEP with supervision, for improved trunk and lower extremity strength, balance, standing tolerance.  TARGET 05/25/2019    Time  4    Period  Weeks    Status  New     Target Date  05/25/19      PT SHORT TERM GOAL #2   Title  Patient will perform squat pivot transfers with min assist to demosntrate improved ability to initiate transfer.     Time  4    Period  Weeks    Status  New    Target Date  05/25/19      PT SHORT TERM GOAL #3   Title  Pt will perform standing at sink with UE support with supervision, for at least 5 minutes, for improved standing tolerance for ADLs.    Time  4    Period  Weeks    Status  New    Target Date  05/25/19        PT Long Term Goals - 04/20/19 1140      PT LONG TERM GOAL #1   Title  Pt will perform updated and progressive HEP for improved transfers, strength, flexibility, with supervision.  TARGET 06/22/2019    Time  8    Period  Weeks    Status  New      PT LONG TERM GOAL #2   Title  Pt will perform squat pivot transfers with supervision, for improved transfer safety and efficiency.    Time  8    Period  Weeks    Status  New      PT LONG TERM GOAL #3   Title  Pt will perform standing activities at sink with 1-2 UE support, with supervision, for improved standing balance and participation in ADLs.    Time  8    Period  Weeks    Status  New      PT LONG TERM GOAL #4   Title  Pt will perform sit to stand transfer with supervision for improved transfers in preparation for ADL participation/gait prep as tolerated.    Time  8    Period  Weeks    Status  New            Plan - 05/18/19 1548    Clinical Impression Statement  Skilled session focused on transfers and standing in [place] along with bed mobility.  Pt continues to have increased tone and clonus observed today in bil LE's.  Have not heard back from  MD concerning w/c evaluation order.  Continue PT per POC.    Personal Factors and Comorbidities  Comorbidity 2;Time since onset of injury/illness/exacerbation    Comorbidities  nystagmus, ataxia; severity of MS progression    Examination-Activity Limitations  Bathing;Bed Mobility;Caring for  Others;Dressing;Transfers;Sit;Stand;Toileting    Examination-Participation Restrictions  Driving;Other;Meal Prep   Caring for son (6 yo)   Stability/Clinical Decision Making  Evolving/Moderate complexity    Rehab Potential  Good   for goals established in POC   PT Frequency  Other (comment)   1x/wk for 4 weeks, then 2x/wk for 4 weeks   PT Duration  8 weeks   8 weeks total POC, plus eval   PT Treatment/Interventions  ADLs/Self Care Home Management;Aquatic Therapy;Neuromuscular re-education;Balance training;Therapeutic exercise;Therapeutic activities;Functional mobility training;Patient/family education;Orthotic Fit/Training    PT Next Visit Plan  Contact MD concerning referal for w/c evaluation.  Review HEP given 1/22 and add further trunk strengthening, lower extremity flexibility/ROM; try supine exercises for hip strengthening (she may have CNA who can help with her HEP); standing at sink or parallel bars; use of aerobic machines for lower extremity flexibility/strength    PT Home Exercise Plan  Access Code: [email or web address] URL: [email or web address]    Consulted and Agree with Plan of Care  Patient       Patient will benefit from skilled therapeutic intervention in order to improve the following deficits and impairments:  Decreased coordination, Decreased range of motion, Impaired tone, Decreased safety awareness, Decreased activity tolerance, Decreased balance, Impaired flexibility, Decreased mobility, Decreased strength  Visit Diagnosis: Muscle weakness (generalized)  Other symptoms and signs involving the nervous system     Problem List Patient Active Problem List   Diagnosis Date Noted  . Rash 03/29/2019  . Wheelchair bound 11/29/2018  . Ataxia 11/29/2018  . Nystagmus 11/29/2018  . Urinary incontinence, urge 11/29/2018  . MS (multiple sclerosis) ([place])   . Neurological deficit present 11/11/2010    Narda Bonds, PTA [facility] 05/18/19 4:06 PM Phone: [phone] Fax: [facility] [address] [place], [place], [address] Phone: [phone]   Fax:  [phone]  Name: Melissa West MRN: [id number] Date of Birth: 1990/11/29

## 2019-05-21 ENCOUNTER — Other Ambulatory Visit: Payer: Self-pay | Admitting: Neurology

## 2019-05-21 DIAGNOSIS — G35 Multiple sclerosis: Secondary | ICD-10-CM | POA: Diagnosis not present

## 2019-05-21 DIAGNOSIS — R29898 Other symptoms and signs involving the musculoskeletal system: Secondary | ICD-10-CM

## 2019-05-21 DIAGNOSIS — Z993 Dependence on wheelchair: Secondary | ICD-10-CM

## 2019-05-21 NOTE — Telephone Encounter (Signed)
I have written a Wheelchair eval referal

## 2019-05-25 ENCOUNTER — Ambulatory Visit: Payer: Medicare Other | Admitting: Physical Therapy

## 2019-05-25 ENCOUNTER — Telehealth: Payer: Self-pay | Admitting: Neurology

## 2019-05-25 DIAGNOSIS — G35 Multiple sclerosis: Secondary | ICD-10-CM

## 2019-05-25 DIAGNOSIS — Z993 Dependence on wheelchair: Secondary | ICD-10-CM

## 2019-05-25 DIAGNOSIS — G35D Multiple sclerosis, unspecified: Secondary | ICD-10-CM

## 2019-05-25 DIAGNOSIS — R29898 Other symptoms and signs involving the musculoskeletal system: Secondary | ICD-10-CM

## 2019-05-25 DIAGNOSIS — R270 Ataxia, unspecified: Secondary | ICD-10-CM

## 2019-05-25 NOTE — Telephone Encounter (Signed)
Pt is asking for a call from RN to discuss a request for an order for a special type wheelchair that can have wheels removed to fit in the trunk of a car, please call pt to discuss further.

## 2019-05-26 ENCOUNTER — Other Ambulatory Visit: Payer: Self-pay | Admitting: Neurology

## 2019-05-26 DIAGNOSIS — Z993 Dependence on wheelchair: Secondary | ICD-10-CM

## 2019-05-26 DIAGNOSIS — R270 Ataxia, unspecified: Secondary | ICD-10-CM

## 2019-05-26 DIAGNOSIS — G35 Multiple sclerosis: Secondary | ICD-10-CM

## 2019-05-28 NOTE — Telephone Encounter (Signed)
I called pt back to get more information. Verified she is needing order for [facility] where wheels can be removed. She will call back to let me know what DME company she would like order faxed to and the fax#

## 2019-05-29 NOTE — Addendum Note (Signed)
Addended by: Hillis Range on: 05/29/2019 12:16 PM   Modules accepted: Orders

## 2019-05-29 NOTE — Telephone Encounter (Signed)
Faxed order to [employer] as requested. Received fax confirmation.

## 2019-05-29 NOTE — Telephone Encounter (Signed)
Rx printed, waiting on MD signature and then will fax order

## 2019-05-29 NOTE — Telephone Encounter (Signed)
Pt called back with fax number. [phone]-[facility]

## 2019-06-04 NOTE — Telephone Encounter (Signed)
Faxed updated OV notes as requested to [facility] for WC. Fax: [phone]. Received fax confirmation.

## 2019-06-14 DIAGNOSIS — G35 Multiple sclerosis: Secondary | ICD-10-CM | POA: Diagnosis not present

## 2019-06-18 DIAGNOSIS — G35 Multiple sclerosis: Secondary | ICD-10-CM | POA: Diagnosis not present

## 2019-06-26 ENCOUNTER — Telehealth: Payer: Self-pay | Admitting: Neurology

## 2019-06-26 NOTE — Telephone Encounter (Signed)
Pt is asking for a letter to be prepared by Dr Epimenio Foot giving pt the ok to begin pursuing steps to drive again.  Pt states that it has been 5-6 years and she is aware she would have to take a class/classes and have modifications made to her vehicle. Pt states step one would be getting a letter from Dr Epimenio Foot 1st.  Please call pt to discuss.

## 2019-06-28 ENCOUNTER — Encounter: Payer: Self-pay | Admitting: Neurology

## 2019-06-28 NOTE — Telephone Encounter (Signed)
I wrote a letter and printed.  I will sign

## 2019-06-28 NOTE — Telephone Encounter (Signed)
Done mail on 06/28/19

## 2019-06-28 NOTE — Telephone Encounter (Signed)
Stanton Kidney- can you pull it from up front file and mail letter instead? Thank you!

## 2019-06-28 NOTE — Telephone Encounter (Signed)
Called, LVM for pt letting her know Dr. Epimenio Foot wrote letter and it is up front for pick up.

## 2019-06-28 NOTE — Telephone Encounter (Signed)
Pt called and would like to know if the letter can be mailed to her. Please advise.

## 2019-07-03 ENCOUNTER — Ambulatory Visit: Payer: Medicare Other | Admitting: Dermatology

## 2019-07-05 NOTE — Telephone Encounter (Signed)
Pt returned call and message was relayed.

## 2019-07-05 NOTE — Telephone Encounter (Addendum)
Called and LVM for pt letting her know we will fax referral to [employer] out of [place], [place]. Their phone: [phone]. Faxed referral to [phone]. Received fax confirmation. Asked pt to call back if she had any further questions/concerns.

## 2019-07-05 NOTE — Telephone Encounter (Signed)
Pt called wanting to speak to RN about what steps she can take for the evaluation that she needs so that she can start driving. Pt is also interested in getting hand controls. Please advise.

## 2019-07-09 ENCOUNTER — Other Ambulatory Visit: Payer: Self-pay | Admitting: *Deleted

## 2019-07-09 DIAGNOSIS — Z79899 Other long term (current) drug therapy: Secondary | ICD-10-CM

## 2019-07-09 DIAGNOSIS — G35 Multiple sclerosis: Secondary | ICD-10-CM

## 2019-07-09 NOTE — Progress Notes (Addendum)
Faxed completed/signed Tysabri pt status report and reauth questionnaire to [facility] at [phone]. Received confirmation.  Received fax that pt re-authorized for Tysabri from 07/09/19 to 01/30/20. Pt enrollment number: [id number]. Account: [employer]. Site auth number: [id number].   Per Freddi Starr, RN with [facility], pt coming this Thursday (07/12/19) for next infusion. She will have pt get labs while here.  Last seen 03/29/19 and next f/u 07/30/19.

## 2019-07-12 ENCOUNTER — Other Ambulatory Visit: Payer: Self-pay | Admitting: Neurology

## 2019-07-12 ENCOUNTER — Ambulatory Visit (INDEPENDENT_AMBULATORY_CARE_PROVIDER_SITE_OTHER): Payer: Medicare Other | Admitting: Neurology

## 2019-07-12 ENCOUNTER — Encounter: Payer: Self-pay | Admitting: Neurology

## 2019-07-12 DIAGNOSIS — Z993 Dependence on wheelchair: Secondary | ICD-10-CM

## 2019-07-12 DIAGNOSIS — Z79899 Other long term (current) drug therapy: Secondary | ICD-10-CM

## 2019-07-12 DIAGNOSIS — G35 Multiple sclerosis: Secondary | ICD-10-CM | POA: Diagnosis not present

## 2019-07-12 DIAGNOSIS — N3941 Urge incontinence: Secondary | ICD-10-CM

## 2019-07-12 DIAGNOSIS — R270 Ataxia, unspecified: Secondary | ICD-10-CM | POA: Diagnosis not present

## 2019-07-12 NOTE — Progress Notes (Signed)
[facility]  PATIENT: Melissa West DOB: 06-14-1990  REFERRING DOCTOR OR PCP:  Allyn Kenner SOURCE: Patient, notes from primary care, notes from [facility] neurology, multiple MRI reports,[id number]  _________________________________   HISTORICAL  CHIEF COMPLAINT:  No chief complaint on file.   HISTORY OF PRESENT ILLNESS:   Melissa West is a 29 year old woman with relapsing remitting multiple sclerosis.     Update 07/12/2019: She had been on Tysabri for her disease modifying therapy for MS.  Unfortunately, she is having more difficulties with venous access.  She does not want to have a Port-A-Cath.  We discussed this further and she would like to switch from Tysabri to another disease modifying therapy.  We went over some options and she was interested in either Zeposia or Mayzent.  I discussed with her that we can check some blood work today but she would need to have an eye exam and an EKG before being able to start.  Her MS is otherwise about the same as the last visit.  Specifically she has bilateral leg weakness and spasticity and is wheelchair-bound.  She has urinary urgency and frequency.   Update 03/29/19 Mobility issues: Melissa West is a 29 year old woman with multiple sclerosis who has bilateral leg weakness and spasticity.  She is currently wheelchair-bound due to an inability to walk but the MS related weakness and spasticity.  Her mobility issues prevent her from completing activities of daily living including personal hygiene, meal preparation, light chores..  She is unable to safely ambulate with a walker or cane.  She is able to safely self propel a wheelchair.  She is unable to safely operate a wheelchair weighing more than 36 pounds.  MS: She is on Tysabri for MS.   JCV Ab 11/29/18 was negative at 0.19.  Next infusion is today.   She is tolerating it well and has no exacerbation.  She has bilateral leg weakness, a little weaker on right, with  spasticity.     She has no numbness.   She notes mild clumsiness in hands but can eat/write/type ok.    She has   She has had a rash that is itchy x 2 months on her arms (R> L) and mildly right face. It is not erythematous.         We started Vesicare for urinary urgency at the last visit.  She felt it did help her spme but she stopped when the 30 days ran out.   She would like to get back on.   Since the last visit she was placed on modafinil, then phentermine then amantadine for MS fatigue.   Modafinil and phentermine were poorly tolerated.    A rash started after modafinil.       From initial evlauation 11/29/2018: She is a 29 year old woman who was diagnosed with MS about 11 years ago.  At age 18, she had some falls due to poor balance not weakness.   A few months later, she had MRI's performed consistent with MS.  She was placed on Betaseron initially.    She did well for a while and was walking without an aid.  She returned to work.    She thinks she stopped the Betaseron and then had more symptoms including poor gait and diplopia.   She started Tysabri but stopped it during pregnancy.   She had a period of time (2013-2014) without any medications (she was still doing relatively well then).    She had  more trouble with her right leg with weakness and spasticity and returned to [facility] (Dr. Renne Crigler) in 2014.   She was placed back on Tysabri and stayed on until October 2016.   JCV Ab was 0.23 indeterminate 12/2014.    She then stopped the Tysabri and started to see Dr. Gerilyn Pilgrim.   She started Tecfidera but she thinks she was worsening.  In 2018, she had a positive JCV Ab titer (0.44) and a decision was made to swtich to Ocrevus.    About 2 years ago, she went on Ocrevus (thinks only 2 doses).  She hasn't seen any neurologist since and she feels she has progressed a lot.     Currently she is wheelchair bound.    She was able to walk some without a cane in 2016 and steadily worsened after she stopped Tysabri.     She can move legs bit not lift up against gravity.  Balance is very poor.  She has no arm weakness but has poor coordination in the arms.    She denies diplopia but has a dysconjugate gaze.    Vision is reduced even with glasses.    She has urinary frequency and urge incontinence.    She thinks she empties.  She feels cognition is baseline.   She denies depression and anxiety.     She is always tired and sleepy.  She sleeps excessively.    She snores some but has never been told there are pauses or gasps.    Labs able to review some old imaging studies.  A CT scan of the head 07/02/2012 showed white matter lesions consistent with MS that were not present in 2009.  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  MRI of the lumbar spine 10/31/2012 showed demyelinating plaque in the lower thoracic spinal cord and conus medullaris.  MRI report 12/28/2013 of the brain (compared to 2011 )states: Interval disease progression indicated by increased number of innumerable T2 hyperintense lesions within the periventricular and subcortical white matter involving both cerebral hemispheres, with additional foci in the cerebellum and brainstem. No enhancing lesions or restricted diffusion.  MRI cervical spine report 04/12/2013 (compared to 2011) shows: Extensive signal abnormality within the brainstem and cervical spinal cord in keeping with multiple sclerosis. There has been interval progression of disease within the brainstem and possibly at C3-4 within the spinal cord. No enhancement seen on today's exam.   REVIEW OF SYSTEMS: Constitutional: No fevers, chills, sweats, or change in appetite.  She has excessive sleepiness.   Eyes: No visual changes, double vision, eye pain Ear, nose and throat: No hearing loss, ear pain, nasal congestion, sore throat Cardiovascular: No chest pain, palpitations Respiratory: No shortness of breath at rest or with exertion.   She snores but reports  that a sleep study did not show sleep apnea.   GastrointestinaI: No nausea, vomiting, diarrhea, abdominal pain, fecal incontinence Genitourinary: No dysuria, urinary retention or frequency.  No nocturia. Musculoskeletal: No neck pain, back pain Integumentary: No rash, pruritus, skin lesions Neurological: as above Psychiatric: No depression at this time.  No anxiety Endocrine: No palpitations, diaphoresis, change in appetite, change in weigh or increased thirst Hematologic/Lymphatic: No anemia, purpura, petechiae. Allergic/Immunologic: No itchy/runny eyes, nasal congestion, recent allergic reactions, rashes  ALLERGIES: No Known Allergies  HOME MEDICATIONS:  Current Outpatient Medications:  .  amantadine (SYMMETREL) 100 MG capsule, Take 1 capsule (100 mg total) by mouth 2 (two) times daily., Disp: 60 capsule, Rfl:  11 .  natalizumab (TYSABRI) 300 MG/15ML injection, Inject into the vein., Disp: , Rfl:  .  solifenacin (VESICARE) 10 MG tablet, Take 1 tablet (10 mg total) by mouth daily., Disp: 30 tablet, Rfl: 11 .  triamcinolone (KENALOG) 0.025 % cream, Apply 1 application topically as needed., Disp: , Rfl:  .  Vitamin D, Ergocalciferol, (DRISDOL) 1.25 MG (50000 UT) CAPS capsule, Take 1 capsule (50,000 Units total) by mouth every 7 (seven) days., Disp: 13 capsule, Rfl: 1  PAST MEDICAL HISTORY: Past Medical History:  Diagnosis Date  . MS (multiple sclerosis) (HCC)   . No pertinent past medical history     PAST SURGICAL HISTORY: Past Surgical History:  Procedure Laterality Date  . CESAREAN SECTION  08/09/2011   Procedure: CESAREAN SECTION;  Surgeon: Lesly Dukes, MD;  Location: [facility];  Service: Gynecology;  Laterality: N/A;    FAMILY HISTORY: Family History  Problem Relation Age of Onset  . Diabetes Maternal Grandmother   . Hypertension Maternal Grandmother   . Kidney disease Maternal Grandmother     SOCIAL HISTORY:  Social History   Socioeconomic History  . Marital  status: Single    Spouse name: Not on file  . Number of children: Not on file  . Years of education: Not on file  . Highest education [place]: Not on file  Occupational History  . Not on file  Tobacco Use  . Smoking status: Never Smoker  . Smokeless tobacco: Never Used  Substance and Sexual Activity  . Alcohol use: No  . Drug use: No  . Sexual activity: Yes    Birth control/protection: None  Other Topics Concern  . Not on file  Social History Narrative   Left handed    Lives with kid   Caffeine use: none   Social Determinants of Health   Financial Resource Strain:   . Difficulty of Paying Living Expenses:   Food Insecurity:   . Worried About Programme researcher, broadcasting/film/video in the Last Year:   . Barista in the Last Year:   Transportation Needs:   . Freight forwarder (Medical):   Marland Kitchen Lack of Transportation (Non-Medical):   Physical Activity:   . Days of Exercise per Week:   . Minutes of Exercise per Session:   Stress:   . Feeling of Stress :   Social Connections:   . Frequency of Communication with Friends and Family:   . Frequency of Social Gatherings with Friends and Family:   . Attends Religious Services:   . Active Member of Clubs or Organizations:   . Attends Banker Meetings:   Marland Kitchen Marital Status:   Intimate Partner Violence:   . Fear of Current or Ex-Partner:   . Emotionally Abused:   Marland Kitchen Physically Abused:   . Sexually Abused:      PHYSICAL EXAM  There were no vitals filed for this visit.  There is no height or weight on file to calculate BMI.   General: The patient is well-developed and well-nourished and in no acute distress  HEENT:  Head is Ryan/AT.  Sclera are anicteric.    Skin: Extremities are without rash or  edema.  Musculoskeletal:  Back is nontender  Neurologic Exam  Mental status: The patient is alert and oriented x 3 at the time of the examination. The patient has apparent normal recent and remote memory, with an apparently  normal attention span and concentration ability.   Speech is normal.  Cranial nerves:there is  disconjugate gaze with left exotropia..  She has nystagmus with all gazes..   There is good facial sensation to soft touch bilaterally.Facial strength is normal.  Trapezius and sternocleidomastoid strength is normal. No dysarthria is noted.   No obvious hearing deficits are noted.  Motor:  Muscle bulk is normal.   Tone is increased in the legs.  Strength was normal in the arms though rapid rotating movements were mildly reduced on the left.  Strength was 2- /5 in the legs.  Sensory: Sensory testing is intact to pinprick, soft touch and vibration sensation in the arms but reduced vibration in the legs.  Coordination: She has ataxic finger-nose-finger bilaterally.  She cannot do heel-to-shin.  Gait and station: She is wheelchair-bound and cannot stand even with strong support.  Romberg is negative.   Reflexes: Deep tendon reflexes are increased.  In the legs, there is spread at the knees and clonus at the ankles.  Plantar responses are extensor.      ASSESSMENT AND PLAN  1. MS (multiple sclerosis) (HCC)   2. High risk medication use     1.   She wishes to stop Tysabri.  We discussed some other options and she is interested in one of the S1 P receptor modulators.  I will check a cytochrome P450 2C9 and varicella-zoster antibodies.  She has had other recent blood work.  We will see if we can arrange for her to get an eye exam and EKG.     2.    Continue other medications. 3.    rtc 4 months, sooner if new pr worsening issues  Adonna Horsley A. Epimenio Foot, MD, [facility] 07/12/2019, 11:56 AM Certified in Neurology, Clinical Neurophysiology, Sleep Medicine and Neuroimaging  [facility] [address] [place], [place] [address] [phone]

## 2019-07-16 ENCOUNTER — Telehealth: Payer: Self-pay | Admitting: Neurology

## 2019-07-16 NOTE — Telephone Encounter (Signed)
Called and LVM for pt letting her know that her labs are still pending. Need to know those results to know what dose of Mayzent to place her on. She will also need to get home EKG and eye exam prior to starting therapy. She will be contacted to get this schedule once we turn in start form. Advised I will call her with any updates/if I have any further questions.

## 2019-07-16 NOTE — Telephone Encounter (Signed)
Pt called wanting to know what the update is on the medication she had to sign for. Please advise.

## 2019-07-19 DIAGNOSIS — G35 Multiple sclerosis: Secondary | ICD-10-CM | POA: Diagnosis not present

## 2019-07-24 ENCOUNTER — Telehealth: Payer: Self-pay | Admitting: *Deleted

## 2019-07-24 LAB — CYP2C9 GENOTYPING SIPONIMOD

## 2019-07-24 LAB — HEPATIC FUNCTION PANEL
ALT: 10 IU/L (ref 0–32)
AST: 14 IU/L (ref 0–40)
Albumin: 4 g/dL (ref 3.9–5.0)
Alkaline Phosphatase: 69 IU/L (ref 39–117)
Bilirubin Total: 0.4 mg/dL (ref 0.0–1.2)
Bilirubin, Direct: 0.15 mg/dL (ref 0.00–0.40)
Total Protein: 6.4 g/dL (ref 6.0–8.5)

## 2019-07-24 LAB — VARICELLA ZOSTER ANTIBODY, IGG: Varicella zoster IgG: 728 index (ref >165)

## 2019-07-24 NOTE — Telephone Encounter (Signed)
Called pt, advised labs look good. She will be contacted to get ECG and eye exam set up at her home to be cleared completely for Mayzent. Advised her to call me back if she is not contacted for this. She verbalized understanding.

## 2019-07-24 NOTE — Telephone Encounter (Signed)
-----   Message from Asa Lente, MD sent at 07/24/2019  3:20 PM EDT ----- Ms. Inks's lab work looked good.  She was #1/#2 for the 2 C9 genotyping and to go on a standard dose Mayzent.  We can send in the form.

## 2019-07-25 ENCOUNTER — Telehealth: Payer: Self-pay | Admitting: Neurology

## 2019-07-25 NOTE — Telephone Encounter (Signed)
Phone rep checked office voicemail's, @10 :40 this morning with Marcelino Duster has called asking for confirmation that pt is no longer on Tysabri.  Anyone can be spoken to @ [employer] by calling [phone]

## 2019-07-25 NOTE — Telephone Encounter (Signed)
Called [employer] back and spoke with [place]. Verified pt no longer on Tysabri, therapy being switched. They will fax d/c questionnaire to be filled out by fax. Nothing further needed.

## 2019-07-30 ENCOUNTER — Ambulatory Visit: Payer: Medicare Other | Admitting: Family Medicine

## 2019-07-30 ENCOUNTER — Telehealth: Payer: Self-pay | Admitting: *Deleted

## 2019-07-30 NOTE — Telephone Encounter (Signed)
I called pt.  She was in [place] at this time.  I relayed that she had just seen Dr. Epimenio Foot on 07-12-19.  No need to see Amy, NP if she is not having any problems.  She stated that she is not having any problems.  She will be starting on a pill (come off tysabri) confirmed by note.  She needs a handicapped placard, will sign and then mail back to her. Placed form for signature with Dr. Epimenio Foot. I apologized for error about appt,

## 2019-07-31 ENCOUNTER — Telehealth: Payer: Self-pay | Admitting: *Deleted

## 2019-07-31 NOTE — Telephone Encounter (Signed)
Called Mayzent support program at [phone]. Spoke with [place] Custard. She has ECG and macular edema screening scheduled for 08/06/19.  Lab vendor confirmed with pt. Results take 10-14 days to results. Once we receive, we can clear her for therapy on CMM.  Advised pt not showing up in portal on CMM. He provided Case key: [id number]. This did not populate pt. Tried another key: [id number]. I was able to bring up patient. Nothing further needed at this time.

## 2019-08-08 NOTE — Telephone Encounter (Signed)
Received fax that home EKG was normal/sinus arrhythmia. HR: 79. QT: 498. QTcF: 543. QRS: 76. PR: 154. RR: 769. P-R-T: 67 69 47. Macular edema screening: no apparent macular edema in right or left eye. Gave to MD to review.

## 2019-08-14 NOTE — Telephone Encounter (Signed)
Took call from phone staff and spoke with rep with Mayzent. Spoke with [place] Custard. He reviewed her notes. No PA needed for Mayzent starter or maintenance. They confirmed twice with insurance. Copay: 0.00. [facility]. Rx dispensed yesterday. Nothing further needed.

## 2019-08-14 NOTE — Telephone Encounter (Signed)
Callled, LVM for Mayzent support to call our office back. I tried contacting to case manager x3 but it kept forwarding me to leave a message. Advised PA section still says "to be determined". We would like to complete this so patient can get started on therapy asap. Asked they call back and ask for me to discuss.

## 2019-08-16 NOTE — Telephone Encounter (Signed)
Called pt. Confirmed she received starter pack and maintenance dose of Mayzent from specialty pharmacy. She called Mayzent yesterday and was told nurse would f/u with her to go over instructions but she has not heard back yet. I went over instructions of starter pack with her: Days 1 and 2: 1 tablet of 0.25mg , Day 3: 2 tablets of 0.25mg , Day 4: 3 tablets of 0.25mg  and Day 5: 5 tablets of 0.25mg . Then she will start the maintenance dose of 2mg  po qd. I advised her to call Mayzent support back and talk to nurse if she has any further questions. She verbalized understanding and appreciation for call.

## 2019-09-26 DIAGNOSIS — G35 Multiple sclerosis: Secondary | ICD-10-CM | POA: Diagnosis not present

## 2019-09-29 DIAGNOSIS — R5381 Other malaise: Secondary | ICD-10-CM | POA: Diagnosis not present

## 2019-09-29 DIAGNOSIS — R69 Illness, unspecified: Secondary | ICD-10-CM | POA: Diagnosis not present

## 2019-10-18 NOTE — Telephone Encounter (Signed)
Error

## 2019-10-26 DIAGNOSIS — G35 Multiple sclerosis: Secondary | ICD-10-CM | POA: Diagnosis not present

## 2019-10-30 ENCOUNTER — Other Ambulatory Visit: Payer: Self-pay | Admitting: *Deleted

## 2019-10-30 NOTE — Telephone Encounter (Signed)
Marcelino Duster called to let us know that nurse had reached out to pt to make sure she was taking her medication. Pt asked them not to reach out to her. Was not able to verify if she was taking Mayzent. I relayed pt has not called with any questions/concerns but I will reach out. She has upcoming f/u here 11/26/19. She verbalized understanding.  I called and LVM for pt letting her know I was checking in on her to make sure she was doing well and had no questions. Wanted to make sure she was taking Mayzent. Asked her to call back with any questions/concerns.

## 2019-11-12 NOTE — Telephone Encounter (Signed)
She has poor venous access and switching back to Tysabri or Ocrevus would be difficult since she does not want to have a Port-A-Cath.  We could have her try Zeposia to see if she tolerates that better than the Mayzent.

## 2019-11-12 NOTE — Telephone Encounter (Signed)
Pt has called stating she has been told by Mayzent Rep that she needs to notify her Dr that she is no longer taking that medication.  Pt was on hold with phone rep and the call some how disconnected

## 2019-11-12 NOTE — Telephone Encounter (Signed)
Called pt back to further discuss. She stopped Mayzent last week or the week prior. She was not sure exact timeframe. States "it made my heart feel funny". I asked her to describe her sx but she kept repeating "my heart felt funny". I gave her examples (chest pain, palpitations). Pt states she had chest pain. Advised I will send MD message on how to proceed. I will call back.

## 2019-11-12 NOTE — Telephone Encounter (Signed)
Tried calling pt to discuss. Call connected, Icould hear TV in background but pt not responding. I tried calling back. Call would not ring. Could not LVM, VM full. Sent SMS notification.   Per Dr. Epimenio Foot, pt cleared for [facility] therapy. She just needs to come and sign start form.

## 2019-11-12 NOTE — Telephone Encounter (Signed)
Pt call office back. I took call from phone staff. She is agreeable to switch to [place]. She cannot come by office to sign form. Offered to fax it, she does not have access to fax machine. Offered to have Stanton Kidney in our medical records email it, she does not have ability to print form. She would like form mailed to her to sign and she will send back. She is aware this will take a little longer. Reminded her that being off DMT increases risk for relapse. She will try and get form back asap.

## 2019-11-26 ENCOUNTER — Encounter: Payer: Self-pay | Admitting: Neurology

## 2019-11-26 ENCOUNTER — Ambulatory Visit: Payer: Self-pay | Admitting: Neurology

## 2019-11-26 DIAGNOSIS — G35 Multiple sclerosis: Secondary | ICD-10-CM | POA: Diagnosis not present

## 2019-11-26 NOTE — Telephone Encounter (Signed)
I called pt. (When I asked her to verify her DOB, she stated that I should know this since I called her but she did verify her DOB.)  She has received the start form for zeposia but she has not mailed it back yet. She will mail it back to [place].   I will continue to follow.

## 2019-12-11 NOTE — Telephone Encounter (Signed)
I called pt. She reports that she forgot to mail [place] the zeposia start form back and asked that I resend this form to her. I confirmed her address on file is correct.

## 2019-12-25 NOTE — Telephone Encounter (Signed)
I called pt to make sure she received zeposia start form in the mail and has mailed it back. No answer, left a message asking her to call me back. If pt calls back please discuss this with her and let me know.

## 2019-12-27 DIAGNOSIS — G35 Multiple sclerosis: Secondary | ICD-10-CM | POA: Diagnosis not present

## 2020-01-01 NOTE — Telephone Encounter (Signed)
I called pt. No answer, left a message asking her to call me back. If pt calls back please inquire if she has mailed the zeposia form back to [place]. We have not received it.

## 2020-01-03 DIAGNOSIS — L209 Atopic dermatitis, unspecified: Secondary | ICD-10-CM | POA: Diagnosis not present

## 2020-01-03 DIAGNOSIS — Z712 Person consulting for explanation of examination or test findings: Secondary | ICD-10-CM | POA: Diagnosis not present

## 2020-01-03 DIAGNOSIS — D559 Anemia due to enzyme disorder, unspecified: Secondary | ICD-10-CM | POA: Diagnosis not present

## 2020-01-03 DIAGNOSIS — Z0000 Encounter for general adult medical examination without abnormal findings: Secondary | ICD-10-CM | POA: Diagnosis not present

## 2020-01-03 DIAGNOSIS — Z2829 Immunization not carried out because of patient decision for other reason: Secondary | ICD-10-CM | POA: Diagnosis not present

## 2020-01-03 DIAGNOSIS — E782 Mixed hyperlipidemia: Secondary | ICD-10-CM | POA: Diagnosis not present

## 2020-01-03 DIAGNOSIS — G35 Multiple sclerosis: Secondary | ICD-10-CM | POA: Diagnosis not present

## 2020-01-03 DIAGNOSIS — Z9119 Patient's noncompliance with other medical treatment and regimen: Secondary | ICD-10-CM | POA: Diagnosis not present

## 2020-01-03 DIAGNOSIS — N329 Bladder disorder, unspecified: Secondary | ICD-10-CM | POA: Diagnosis not present

## 2020-01-03 DIAGNOSIS — Z112 Encounter for screening for other bacterial diseases: Secondary | ICD-10-CM | POA: Diagnosis not present

## 2020-01-07 NOTE — Telephone Encounter (Signed)
Received signed zeposia start form from pt in the mail from pt.  Will give Dr. Epimenio Foot prescriber portion for review and signature.

## 2020-01-07 NOTE — Addendum Note (Signed)
Addended by: Geronimo Running A on: 01/07/2020 01:42 PM   Modules accepted: Orders

## 2020-01-07 NOTE — Telephone Encounter (Signed)
I called pt. No answer, left a message asking her to call me back. This is my third unsuccessful attempt at reaching pt via phone to discuss zeposia. I will mail pt a letter asking her to call me back.

## 2020-01-10 ENCOUNTER — Telehealth: Payer: Self-pay | Admitting: Neurology

## 2020-01-10 DIAGNOSIS — Z2829 Immunization not carried out because of patient decision for other reason: Secondary | ICD-10-CM | POA: Diagnosis not present

## 2020-01-10 DIAGNOSIS — N329 Bladder disorder, unspecified: Secondary | ICD-10-CM | POA: Diagnosis not present

## 2020-01-10 DIAGNOSIS — Z9119 Patient's noncompliance with other medical treatment and regimen: Secondary | ICD-10-CM | POA: Diagnosis not present

## 2020-01-10 DIAGNOSIS — G35 Multiple sclerosis: Secondary | ICD-10-CM | POA: Diagnosis not present

## 2020-01-10 DIAGNOSIS — R5383 Other fatigue: Secondary | ICD-10-CM | POA: Diagnosis not present

## 2020-01-10 NOTE — Telephone Encounter (Signed)
Per previous phone note from KD,RN, we just received pt signed consent form for Zeposia on 01/07/20. Dr. Epimenio Foot was out of the office until today. He signed. KD,RN will start to process everything for her.

## 2020-01-10 NOTE — Telephone Encounter (Signed)
Pt called stating that she was informed that she was going to be starting a new medication but she has not received a call from the pharmacy yet. Please advise.

## 2020-01-14 NOTE — Telephone Encounter (Signed)
Received signed zeposia start form back from Dr. Epimenio Foot. Pt is cleared for therapy but will need a CBC at her next office visit.  Faxed zeposia start form to [email or web address]. Received a receipt of confirmation.  I tried calling pt again. Pt's number is not available to take calls now. Please get a valid phone number if pt calls back.

## 2020-01-15 NOTE — Telephone Encounter (Signed)
PA for zeposia .11m completed via CMM. Sent to [facility]:: [id number]  [facility] was having difficulty finding pt. Will wait for determination on 0.964mcapsules before attempting starter kit PA.

## 2020-01-15 NOTE — Telephone Encounter (Signed)
Pt returned my call. She has lost her cell phone. Wants [place] to use [phone] until she can find it. I advised her that we received the zeposia start form and are working with insurance auth. She reports feeling tired all the time. She is overdue for an appt with our office but she needs at least 3 days notice to take the bus here. She is agreeable to seeing either Dr. Epimenio Foot or the NP, whichever has availability. I will watch the schedules. Pt verbalized understanding.

## 2020-01-15 NOTE — Telephone Encounter (Signed)
I tried calling pt again. Pt's number is not available to take calls now. Please get a valid phone number if pt calls back.

## 2020-01-17 ENCOUNTER — Other Ambulatory Visit: Payer: Self-pay | Admitting: Neurology

## 2020-01-17 NOTE — Telephone Encounter (Signed)
PA for zeposia starter kit sent to [facility] via CMM. Key: [id number]

## 2020-01-17 NOTE — Telephone Encounter (Signed)
PA for zeposia 0.92mg  capsules was approved by [facility]: "Request Reference Number: [id number]. ZEPOSIA CAP .92MG  is approved through 04/11/2021. Your patient may now fill this prescription and it will be covered."

## 2020-01-21 NOTE — Telephone Encounter (Signed)
[facility] denied start kit. I also got a fax from [place] me that the request for the starter kit was cancelled because it was already approved.  I called [employer] hub. Spoke with [employer]. She will ask their team to run at test claim for the zeposia starter kit to find out what further steps are needed for the PA. I can reach [place] back at [phone], ext [phone].

## 2020-01-21 NOTE — Telephone Encounter (Signed)
Received notice from [employer] hub that an appeal will be needed for zeposia maintenance dose. Appeal letter written. Waiting for Dr. Bonnita Hollow review and signature.

## 2020-01-22 ENCOUNTER — Telehealth: Payer: Self-pay | Admitting: Neurology

## 2020-01-22 NOTE — Telephone Encounter (Signed)
[employer] [employer] has been unable to reach pt. Pt should call [phone].  I called both home and cell number for pt. Not available at either. Home number provided just keeps ringing. Left a VM on cell asking her to call me back.

## 2020-01-22 NOTE — Telephone Encounter (Signed)
Appeal letter for zeposia starter kit was faxed to [employer]. Received a receipt of confirmation. Will continue to follow.

## 2020-01-22 NOTE — Telephone Encounter (Signed)
[facility]) called, PA denied for Ozanimod HCl (ZEPOSIA) 0.92 MG CAPS. Would physician want to appeal or change prescription completely. We can help with the appeal.

## 2020-01-22 NOTE — Telephone Encounter (Signed)
We are aware of the denial of zeposia starter kit. Please see other telephone note regarding this issue. An appeal has already been sent to [employer].

## 2020-01-23 NOTE — Telephone Encounter (Signed)
Pt called, want nurse to explain denial for Zeposia. I inform Pt of telephone note, denial has been appealed.

## 2020-01-25 ENCOUNTER — Telehealth: Payer: Self-pay | Admitting: Neurology

## 2020-01-25 NOTE — Telephone Encounter (Signed)
error 

## 2020-01-25 NOTE — Telephone Encounter (Signed)
Zeposia Support ([place]) PA submitted was for Melissa West was denied for starter kit. Does physician plan to appeal. Would like a call from [place] or [place]. Contact number [phone], ext [phone]. We are open M-F 8a-8p.

## 2020-01-28 MED ORDER — MODAFINIL 200 MG PO TABS: 200.0000 mg | ORAL_TABLET | Freq: Every day | ORAL | 5 refills | Status: DC

## 2020-01-28 NOTE — Telephone Encounter (Signed)
Pt returned my call. I gave her the zeposia 360 phone number to call. She will call this number today.   Pt is complaining of sleeping all the time. She reports that she had a sleep study a few years ago and "it didn't show anything." She is interested in medications. I will discuss with Dr. Epimenio Foot and call her back.  I found sleep study from 2017 completed by Dr. Gerilyn Pilgrim and it was unremarkable.  I spoke with Dr. Epimenio Foot. He recommends provigil 200mg  QD.

## 2020-01-28 NOTE — Telephone Encounter (Signed)
Please see other telephone note regarding this issue.

## 2020-01-28 NOTE — Telephone Encounter (Signed)
I called pt again. Female answered the phone again but when I asked if this was Ms. Pileggi I was hung up on.  I called and left a message on pt's cell phone number asking her to call me back.

## 2020-01-28 NOTE — Addendum Note (Signed)
Addended by: Geronimo Running A on: 01/28/2020 03:28 PM   Modules accepted: Orders

## 2020-01-28 NOTE — Telephone Encounter (Signed)
I called [facility]. Appeal for zeposia starter kit was approved beginning 01/24/2020. Ref # [id number].  I called [id number]. Spoke with [place] and provided this information. Pt has not returned their calls. The PA status for zeposia now looks complete.

## 2020-01-28 NOTE — Telephone Encounter (Signed)
I called pt. A female answered the phone but when I asked if this was Ms. Mosley I was hung up on. I will try again later.

## 2020-01-29 NOTE — Telephone Encounter (Signed)
I called pt. I advised her that Dr. Epimenio Foot called in provigil to help with her fatigue. He sent it to [facility].   She made a follow up with Dr. Epimenio Foot on 05/21/20 at 11:30am.  Pt reports that she spoke to zeposia360 yesterday and her shipment is going to arrive on 01/31/2020.

## 2020-01-30 ENCOUNTER — Telehealth: Payer: Self-pay | Admitting: Neurology

## 2020-01-30 NOTE — Telephone Encounter (Signed)
Called and spoke with patient. She was referring to pill that gives her more energy. When I asked if it is modafinil, she confirmed. She wanted to know why she had 35.00 copay. Advised she would need to contact pharmacy to further discuss reason for copay. She may have deductible to meet. Advised her to call back if the pharmacy needs our office to do anything further for prescription. She verbalized understanding.

## 2020-01-30 NOTE — Telephone Encounter (Signed)
Pt called, pharmacy telling me there is a copay. Can you explain to me why there is a copay? Would like a call from the nurse. Pt did not know the name of the medication.

## 2020-02-04 NOTE — Telephone Encounter (Signed)
I called pt. She received zeposia and the starter kit is going well. She will call us if she has questions or concerns regarding zeposia.

## 2020-02-05 NOTE — Telephone Encounter (Signed)
PA for modafinil completed via CMM. Uploaded letter with clinically relevant documentation since no pertinent clinical questions provided in PA. Sent to [facility]. Key: [id number].

## 2020-02-06 NOTE — Telephone Encounter (Signed)
CMM had further questions needing to be answered after document uploaded from [facility]. I completed these and submitted PA. Key: [id number]. PA Case ID: [id number] - Rx #: [id number]. Waiting on determination from [facility] [employer] Part D.

## 2020-02-07 NOTE — Telephone Encounter (Signed)
Request Reference Number: [id number]. MODAFINIL TAB 200MG  is approved through 08/06/2020. Your patient may now fill this prescription and it will be covered.

## 2020-02-26 ENCOUNTER — Telehealth: Payer: Self-pay | Admitting: *Deleted

## 2020-02-26 ENCOUNTER — Telehealth: Payer: Self-pay | Admitting: Orthopedic Surgery

## 2020-02-26 DIAGNOSIS — G35 Multiple sclerosis: Secondary | ICD-10-CM | POA: Diagnosis not present

## 2020-02-26 NOTE — Telephone Encounter (Signed)
Patient called to inquire about appointment with Dr Romeo Apple - states had a fall onto her knee between 1 to 2 weeks ago, and states has not yet had any treatment. Offered appointment which is into the 2nd week of December with Dr Romeo Apple, and offered sooner appointments with our other providers. Relayed she may want to contact her primary care, and/or consider being checked through one of the urgent care locations. Patient will decide and call back.

## 2020-02-26 NOTE — Telephone Encounter (Signed)
Called and offered sooner appt tomorrow with Dr. Epimenio Foot at 10am. Pt declined, states she needs 3 days notice to set up transportation. Unable to accept.

## 2020-03-05 NOTE — Telephone Encounter (Addendum)
I tried calling pt to offer sooner appt.  Tried calling [phone], phone continued to ring. Tried [phone]. Received automated message that person I dialed is not able to receive calls at this time.

## 2020-03-24 NOTE — Telephone Encounter (Signed)
Called pt to offer sooner appt this Thursday at 1:30pm with Dr. Epimenio Foot. Pt declined. States she has son that goes to school and she would have to find child care for him which she is unable to do so. I offered 07/11/19 at 1:00pm. Pt declined. Scheduled pt for 07/21/20 at 11:30am with Dr. Epimenio Foot. Pt spoke over me several times, expressed dissatisfaction. She placed phone down and asked someone to hang up the phone, in which they did. Call was ended on her end.  Upon review of her chart, she also has a follow up on 05/22/19 at 11:30am.

## 2020-03-27 DIAGNOSIS — G35 Multiple sclerosis: Secondary | ICD-10-CM | POA: Diagnosis not present

## 2020-04-27 DIAGNOSIS — G35 Multiple sclerosis: Secondary | ICD-10-CM | POA: Diagnosis not present

## 2020-05-16 DIAGNOSIS — T22212A Burn of second degree of left forearm, initial encounter: Secondary | ICD-10-CM | POA: Diagnosis not present

## 2020-05-21 ENCOUNTER — Ambulatory Visit: Payer: Self-pay | Admitting: Neurology

## 2020-05-28 DIAGNOSIS — G35 Multiple sclerosis: Secondary | ICD-10-CM | POA: Diagnosis not present

## 2020-06-25 DIAGNOSIS — G35 Multiple sclerosis: Secondary | ICD-10-CM | POA: Diagnosis not present

## 2020-07-17 DIAGNOSIS — R531 Weakness: Secondary | ICD-10-CM | POA: Diagnosis not present

## 2020-07-17 DIAGNOSIS — R5381 Other malaise: Secondary | ICD-10-CM | POA: Diagnosis not present

## 2020-07-21 ENCOUNTER — Ambulatory Visit: Payer: Self-pay | Admitting: Neurology

## 2020-07-25 DIAGNOSIS — G35 Multiple sclerosis: Secondary | ICD-10-CM | POA: Diagnosis not present

## 2020-07-28 DIAGNOSIS — N3281 Overactive bladder: Secondary | ICD-10-CM | POA: Diagnosis not present

## 2020-08-06 ENCOUNTER — Other Ambulatory Visit: Payer: Self-pay | Admitting: Neurology

## 2020-08-07 ENCOUNTER — Other Ambulatory Visit: Payer: Self-pay | Admitting: Neurology

## 2020-08-07 NOTE — Addendum Note (Signed)
Addended by: Arther Abbott on: 08/07/2020 05:10 PM   Modules accepted: Orders

## 2020-08-07 NOTE — Telephone Encounter (Signed)
Pt has scheduled her annual f/u and is now asking for a refill on her modafinil (PROVIGIL) 200 MG tablet to [facility]

## 2020-08-08 MED ORDER — MODAFINIL 200 MG PO TABS: 200.0000 mg | ORAL_TABLET | Freq: Every day | ORAL | 3 refills | Status: DC

## 2020-08-25 DIAGNOSIS — G35 Multiple sclerosis: Secondary | ICD-10-CM | POA: Diagnosis not present

## 2020-10-01 DIAGNOSIS — G35 Multiple sclerosis: Secondary | ICD-10-CM | POA: Diagnosis not present

## 2020-10-31 DIAGNOSIS — G35 Multiple sclerosis: Secondary | ICD-10-CM | POA: Diagnosis not present

## 2020-11-05 DIAGNOSIS — N390 Urinary tract infection, site not specified: Secondary | ICD-10-CM | POA: Diagnosis not present

## 2020-12-01 DIAGNOSIS — G35 Multiple sclerosis: Secondary | ICD-10-CM | POA: Diagnosis not present

## 2020-12-04 ENCOUNTER — Ambulatory Visit: Payer: Medicare Other | Admitting: Neurology

## 2020-12-10 ENCOUNTER — Encounter: Payer: Self-pay | Admitting: Neurology

## 2020-12-10 ENCOUNTER — Ambulatory Visit: Payer: Medicare Other | Admitting: Neurology

## 2021-01-06 ENCOUNTER — Other Ambulatory Visit: Payer: Self-pay | Admitting: Neurology

## 2021-01-09 DIAGNOSIS — G35 Multiple sclerosis: Secondary | ICD-10-CM | POA: Diagnosis not present

## 2021-01-14 ENCOUNTER — Telehealth: Payer: Self-pay | Admitting: Neurology

## 2021-01-14 NOTE — Telephone Encounter (Signed)
Pt has scheduled a 1 yr f/u and is asking if she can get a refill on her modafinil (PROVIGIL) 200 MG tablet to [facility]

## 2021-01-14 NOTE — Telephone Encounter (Signed)
Called the pt back. There was no answer. Unable to LVM for the pt.   **If pt calls back, unfortunately we are not able to fill the medication until the patient is seen. This is because of the type of medication it is. Since it is a stimulant class drug, the patient is suppose to follow up every 6 months. This is also Dr Bonnita Hollow recommendation for his MS patients as well. Her last ov was 07/2019. We had attempted to get her worked in sooner before but pt declined. I can try and work her in again,so that we can fill her medication for her. Would offer 01/19/21 at 9 am or 9:30 am for the patient with Dr. Epimenio Foot.

## 2021-01-28 ENCOUNTER — Other Ambulatory Visit: Payer: Self-pay | Admitting: Neurology

## 2021-01-29 DIAGNOSIS — G35 Multiple sclerosis: Secondary | ICD-10-CM | POA: Diagnosis not present

## 2021-02-09 DIAGNOSIS — G9332 Myalgic encephalomyelitis/chronic fatigue syndrome: Secondary | ICD-10-CM | POA: Insufficient documentation

## 2021-02-23 ENCOUNTER — Emergency Department (HOSPITAL_COMMUNITY)
Admission: EM | Admit: 2021-02-23 | Discharge: 2021-02-24 | Disposition: A | Discharge status: Home / Self Care | Payer: Medicare Other | Attending: Emergency Medicine | Admitting: Emergency Medicine

## 2021-02-23 ENCOUNTER — Emergency Department (HOSPITAL_COMMUNITY): Payer: Medicare Other

## 2021-02-23 ENCOUNTER — Encounter (HOSPITAL_COMMUNITY): Payer: Self-pay | Admitting: Emergency Medicine

## 2021-02-23 ENCOUNTER — Other Ambulatory Visit: Payer: Self-pay

## 2021-02-23 DIAGNOSIS — M545 Low back pain, unspecified: Secondary | ICD-10-CM | POA: Insufficient documentation

## 2021-02-23 DIAGNOSIS — R110 Nausea: Secondary | ICD-10-CM | POA: Diagnosis not present

## 2021-02-23 DIAGNOSIS — Z743 Need for continuous supervision: Secondary | ICD-10-CM | POA: Diagnosis not present

## 2021-02-23 DIAGNOSIS — J101 Influenza due to other identified influenza virus with other respiratory manifestations: Secondary | ICD-10-CM | POA: Diagnosis not present

## 2021-02-23 DIAGNOSIS — R5381 Other malaise: Secondary | ICD-10-CM | POA: Diagnosis not present

## 2021-02-23 DIAGNOSIS — R531 Weakness: Secondary | ICD-10-CM | POA: Diagnosis not present

## 2021-02-23 DIAGNOSIS — R509 Fever, unspecified: Secondary | ICD-10-CM | POA: Diagnosis not present

## 2021-02-23 DIAGNOSIS — Z20822 Contact with and (suspected) exposure to covid-19: Secondary | ICD-10-CM | POA: Insufficient documentation

## 2021-02-23 DIAGNOSIS — R059 Cough, unspecified: Secondary | ICD-10-CM | POA: Diagnosis not present

## 2021-02-23 LAB — CBC WITH DIFFERENTIAL/PLATELET
Abs Immature Granulocytes: 0.01 10*3/uL (ref 0.00–0.07)
Basophils Absolute: 0 10*3/uL (ref 0.0–0.1)
Basophils Relative: 1 %
Eosinophils Absolute: 0 10*3/uL (ref 0.0–0.5)
Eosinophils Relative: 0 %
HCT: 37.6 % (ref 36.0–46.0)
Hemoglobin: 12.8 g/dL (ref 12.0–15.0)
Immature Granulocytes: 0 %
Lymphocytes Relative: 7 %
Lymphs Abs: 0.3 10*3/uL — ABNORMAL LOW (ref 0.7–4.0)
MCH: 30.6 pg (ref 26.0–34.0)
MCHC: 34 g/dL (ref 30.0–36.0)
MCV: 90 fL (ref 80.0–100.0)
Monocytes Absolute: 0.6 10*3/uL (ref 0.1–1.0)
Monocytes Relative: 13 %
Neutro Abs: 3.2 10*3/uL (ref 1.7–7.7)
Neutrophils Relative %: 79 %
Platelets: 174 10*3/uL (ref 150–400)
RBC: 4.18 MIL/uL (ref 3.87–5.11)
RDW: 13.3 % (ref 11.5–15.5)
WBC: 4.1 10*3/uL (ref 4.0–10.5)
nRBC: 0 % (ref 0.0–0.2)

## 2021-02-23 LAB — COMPREHENSIVE METABOLIC PANEL
ALT: 19 U/L (ref 0–44)
AST: 20 U/L (ref 15–41)
Albumin: 3.8 g/dL (ref 3.5–5.0)
Alkaline Phosphatase: 59 U/L (ref 38–126)
Anion gap: 8 (ref 5–15)
BUN: 10 mg/dL (ref 6–20)
CO2: 25 mmol/L (ref 22–32)
Calcium: 8.6 mg/dL — ABNORMAL LOW (ref 8.9–10.3)
Chloride: 99 mmol/L (ref 98–111)
Creatinine, Ser: 0.75 mg/dL (ref 0.44–1.00)
GFR, Estimated: >60 mL/min (ref >60)
Glucose, Bld: 89 mg/dL (ref 70–99)
Potassium: 3.8 mmol/L (ref 3.5–5.1)
Sodium: 132 mmol/L — ABNORMAL LOW (ref 135–145)
Total Bilirubin: 0.8 mg/dL (ref 0.3–1.2)
Total Protein: 7.3 g/dL (ref 6.5–8.1)

## 2021-02-23 LAB — RESP PANEL BY RT-PCR (FLU A&B, COVID) ARPGX2
Influenza A by PCR: POSITIVE — AB
Influenza B by PCR: NEGATIVE
SARS Coronavirus 2 by RT PCR: NEGATIVE

## 2021-02-23 LAB — LIPASE, BLOOD: Lipase: 39 U/L (ref 11–51)

## 2021-02-23 MED ORDER — BENZONATATE 100 MG PO CAPS: 100.0000 mg | ORAL_CAPSULE | Freq: Three times a day (TID) | ORAL | 0 refills | Status: DC

## 2021-02-23 MED ORDER — ALBUTEROL SULFATE HFA 108 (90 BASE) MCG/ACT IN AERS: 1.0000 | INHALATION_SPRAY | Freq: Four times a day (QID) | RESPIRATORY_TRACT | 0 refills | Status: DC | PRN

## 2021-02-23 MED ORDER — ONDANSETRON 4 MG PO TBDP: 4.0000 mg | ORAL_TABLET | Freq: Three times a day (TID) | ORAL | 0 refills | Status: DC | PRN

## 2021-02-23 MED ORDER — OSELTAMIVIR PHOSPHATE 75 MG PO CAPS: 75.0000 mg | ORAL_CAPSULE | Freq: Two times a day (BID) | ORAL | 0 refills | Status: DC

## 2021-02-23 MED ORDER — FLUTICASONE PROPIONATE 50 MCG/ACT NA SUSP: 2.0000 | Freq: Every day | NASAL | 0 refills | Status: DC

## 2021-02-23 MED ORDER — ACETAMINOPHEN 500 MG PO TABS
1000.0000 mg | ORAL_TABLET | Freq: Once | ORAL | Status: AC
  Administered 2021-02-23: 1000 mg via ORAL
  Filled 2021-02-23: qty 2

## 2021-02-23 MED ORDER — SODIUM CHLORIDE 0.9 % IV BOLUS
500.0000 mL | Freq: Once | INTRAVENOUS | Status: AC
  Administered 2021-02-23: 500 mL via INTRAVENOUS

## 2021-02-23 NOTE — ED Triage Notes (Addendum)
Pt History of MS felt weak x 2 days. Back pain started today 4/10

## 2021-02-23 NOTE — ED Notes (Signed)
Dc instructions and scripts reviewed with pt. All questions answered. No other concerns at this time

## 2021-02-23 NOTE — ED Notes (Signed)
Mother contacted and will come to pick up patient.

## 2021-02-23 NOTE — Discharge Instructions (Signed)
You were seen in the emergency room today with cough and cold symptoms.  You tested positive for flu A.  Calling in some medication to help shorten the duration of your symptoms.  If you develop chest pain, shortness of breath, other sudden/severe symptoms you should return to the emergency department for reevaluation.

## 2021-02-23 NOTE — ED Provider Notes (Signed)
Emergency Department Provider Note   I have reviewed the triage vital signs and the nursing notes.   HISTORY  Chief Complaint No chief complaint on file.   HPI Melissa West is a 30 y.o. female with past history of MS presents to the emergency department with fever along with cough and URI symptoms.  Patient notes she is having some mid, lower back pain as well.  Symptoms began yesterday.  Patient tells me that her mom became concerned and ultimately called EMS.  She has baseline weakness in her legs and uses a wheelchair to get around most of the time.  She is not feeling particularly new or worsening neurologic symptoms.  She had a headache earlier in the day but resolved without any particular treatment.  No active headache or neck pain.  She denies any pain in the chest or abdomen.  No vomiting or diarrhea.  No new medications.  No sick contacts.    Past Medical History:  Diagnosis Date   MS (multiple sclerosis) (HCC)    No pertinent past medical history     Patient Active Problem List   Diagnosis Date Noted   Rash 03/29/2019   Wheelchair bound 11/29/2018   Ataxia 11/29/2018   Nystagmus 11/29/2018   Urinary incontinence, urge 11/29/2018   MS (multiple sclerosis) (HCC)    Neurological deficit present 11/11/2010    Past Surgical History:  Procedure Laterality Date   CESAREAN SECTION  08/09/2011   Procedure: CESAREAN SECTION;  Surgeon: Lesly Dukes, MD;  Location: [facility];  Service: Gynecology;  Laterality: N/A;    Allergies Patient has no known allergies.  Family History  Problem Relation Age of Onset   Diabetes Maternal Grandmother    Hypertension Maternal Grandmother    Kidney disease Maternal Grandmother     Social History Social History   Tobacco Use   Smoking status: Never   Smokeless tobacco: Never  Substance Use Topics   Alcohol use: No   Drug use: No    Review of Systems  Constitutional: Positive fever, body aches, and fatigue.   Eyes: No visual changes. ENT: No sore throat. Positive congestion.  Cardiovascular: Denies chest pain. Respiratory: Denies shortness of breath. Positive mild cough.  Gastrointestinal: No abdominal pain.  No nausea, no vomiting.  No diarrhea.  No constipation. Genitourinary: Negative for dysuria. Musculoskeletal: Positive midline lower back pain.  Skin: Negative for rash. Neurological: Negative for focal weakness or numbness. Positive HA earlier (resolved).   10-point ROS otherwise negative.  ____________________________________________   PHYSICAL EXAM:  VITAL SIGNS: ED Triage Vitals  Enc Vitals Group     BP 02/23/21 1741 115/81     Pulse Rate 02/23/21 1741 91     Resp 02/23/21 1741 20     Temp 02/23/21 1741 100 F (37.8 C)     Temp Source 02/23/21 1741 Oral     SpO2 02/23/21 1741 100 %     Weight 02/23/21 1752 130 lb (59 kg)     Height 02/23/21 1752 5\' 2"  (1.575 m)   Constitutional: Alert and oriented. Well appearing and in no acute distress. Eyes: Conjunctivae are normal.  Head: Atraumatic. Nose: No congestion/rhinnorhea. Mouth/Throat: Mucous membranes are moist.  Oropharynx non-erythematous. Neck: No stridor.   Cardiovascular: Normal rate, regular rhythm. Good peripheral circulation. Grossly normal heart sounds.   Respiratory: Normal respiratory effort.  No retractions. Lungs CTAB. Gastrointestinal: Soft and nontender. No distention.  Musculoskeletal: No lower extremity tenderness nor edema.  Neurologic:  Normal speech  and language. 2/5 strength in the bilateral LEs and 3/5 in the RUE with 4+/5 on the LUE.  Skin:  Skin is warm, dry and intact. No rash noted.  ____________________________________________   LABS (all labs ordered are listed, but only abnormal results are displayed)  Labs Reviewed  RESP PANEL BY RT-PCR (FLU A&B, COVID) ARPGX2 - Abnormal; Notable for the following components:      Result Value   Influenza A by PCR POSITIVE (*)    All other  components within normal limits  COMPREHENSIVE METABOLIC PANEL - Abnormal; Notable for the following components:   Sodium 132 (*)    Calcium 8.6 (*)    All other components within normal limits  CBC WITH DIFFERENTIAL/PLATELET - Abnormal; Notable for the following components:   Lymphs Abs 0.3 (*)    All other components within normal limits  URINE CULTURE  LIPASE, BLOOD  URINALYSIS, ROUTINE W REFLEX MICROSCOPIC  PREGNANCY, URINE   ____________________________________________  EKG  None   ____________________________________________  RADIOLOGY  DG Chest Portable 1 View  Result Date: 02/23/2021 CLINICAL DATA:  Fever, cough EXAM: PORTABLE CHEST 1 VIEW COMPARISON:  None. FINDINGS: The heart size and mediastinal contours are within normal limits. Both lungs are clear. The visualized skeletal structures are unremarkable. IMPRESSION: No active disease. Electronically Signed   By: Charlett Nose M.D.   On: 02/23/2021 18:38    ____________________________________________   PROCEDURES  Procedure(s) performed:   Procedures  None  ____________________________________________   INITIAL IMPRESSION / ASSESSMENT AND PLAN / ED COURSE  Pertinent labs & imaging results that were available during my care of the patient were reviewed by me and considered in my medical decision making (see chart for details).   Patient presents emergency department with fever along with fatigue and URI symptoms mainly.  She does have some midline lower back pain but no focal tenderness on exam.  Do not see obvious injury or area of ulceration.  She arrives mildly febrile but otherwise normal vital signs.  Suspicion for sepsis is low but patient's MS diagnosis does make her higher risk.  Plan for COVID/flu testing.  Patient is initially hesitant about having this testing performed but given her symptoms she is willing to consent for PCR test.  Plan for labs and gentle IV fluids.  Will give Tylenol. Doubt  meningitis/encephalitis or sepsis. Neuro exam similar to what is documented in recent Neuro notes available in the chart.   Labs here are reassuring.  Patient is testing positive for flu A.  She has no leukocytosis or AKI. No UTI symptoms. Will cancel UA as patient's symptoms explained by her flu result. Discussed pros/cons of Tamiflu. Will send her home with this and plan for supportive care with close PCP follow up.  ____________________________________________  FINAL CLINICAL IMPRESSION(S) / ED DIAGNOSES  Final diagnoses:  Influenza A     MEDICATIONS GIVEN DURING THIS VISIT:  Medications  acetaminophen (TYLENOL) tablet 1,000 mg (1,000 mg Oral Given 02/23/21 1915)  sodium chloride 0.9 % bolus 500 mL (0 mLs Intravenous Stopped 02/23/21 2018)     NEW OUTPATIENT MEDICATIONS STARTED DURING THIS VISIT:  New Prescriptions   ALBUTEROL (VENTOLIN HFA) 108 (90 BASE) MCG/ACT INHALER    Inhale 1-2 puffs into the lungs every 6 (six) hours as needed for wheezing or shortness of breath.   BENZONATATE (TESSALON) 100 MG CAPSULE    Take 1 capsule (100 mg total) by mouth every 8 (eight) hours.   FLUTICASONE (FLONASE) 50 MCG/ACT NASAL SPRAY  Place 2 sprays into both nostrils daily.   ONDANSETRON (ZOFRAN ODT) 4 MG DISINTEGRATING TABLET    Take 1 tablet (4 mg total) by mouth every 8 (eight) hours as needed for nausea or vomiting.   OSELTAMIVIR (TAMIFLU) 75 MG CAPSULE    Take 1 capsule (75 mg total) by mouth every 12 (twelve) hours.    Note:  This document was prepared using Dragon voice recognition software and may include unintentional dictation errors.  Alona Bene, MD, [facility]    Mamta Rimmer, Arlyss Repress, MD 02/23/21 2308

## 2021-03-04 DIAGNOSIS — G35 Multiple sclerosis: Secondary | ICD-10-CM | POA: Diagnosis not present

## 2021-03-04 DIAGNOSIS — J069 Acute upper respiratory infection, unspecified: Secondary | ICD-10-CM | POA: Diagnosis not present

## 2021-03-07 ENCOUNTER — Inpatient Hospital Stay (HOSPITAL_COMMUNITY)
Admission: EM | Admit: 2021-03-07 | Discharge: 2021-03-11 | DRG: 683 | Disposition: A | Discharge status: Skilled Nursing Facility | Payer: Medicare Other | Attending: Internal Medicine | Admitting: Internal Medicine

## 2021-03-07 ENCOUNTER — Emergency Department (HOSPITAL_COMMUNITY): Payer: Medicare Other

## 2021-03-07 ENCOUNTER — Encounter (HOSPITAL_COMMUNITY): Payer: Self-pay | Admitting: Emergency Medicine

## 2021-03-07 DIAGNOSIS — R1084 Generalized abdominal pain: Secondary | ICD-10-CM | POA: Diagnosis not present

## 2021-03-07 DIAGNOSIS — Z79899 Other long term (current) drug therapy: Secondary | ICD-10-CM | POA: Diagnosis not present

## 2021-03-07 DIAGNOSIS — K8020 Calculus of gallbladder without cholecystitis without obstruction: Secondary | ICD-10-CM | POA: Diagnosis not present

## 2021-03-07 DIAGNOSIS — Z8249 Family history of ischemic heart disease and other diseases of the circulatory system: Secondary | ICD-10-CM | POA: Diagnosis not present

## 2021-03-07 DIAGNOSIS — D72829 Elevated white blood cell count, unspecified: Secondary | ICD-10-CM | POA: Diagnosis present

## 2021-03-07 DIAGNOSIS — Z993 Dependence on wheelchair: Secondary | ICD-10-CM

## 2021-03-07 DIAGNOSIS — N1330 Unspecified hydronephrosis: Secondary | ICD-10-CM | POA: Diagnosis present

## 2021-03-07 DIAGNOSIS — N3281 Overactive bladder: Secondary | ICD-10-CM | POA: Diagnosis present

## 2021-03-07 DIAGNOSIS — R279 Unspecified lack of coordination: Secondary | ICD-10-CM | POA: Diagnosis not present

## 2021-03-07 DIAGNOSIS — R338 Other retention of urine: Secondary | ICD-10-CM | POA: Diagnosis not present

## 2021-03-07 DIAGNOSIS — Z743 Need for continuous supervision: Secondary | ICD-10-CM | POA: Diagnosis not present

## 2021-03-07 DIAGNOSIS — E878 Other disorders of electrolyte and fluid balance, not elsewhere classified: Secondary | ICD-10-CM | POA: Diagnosis present

## 2021-03-07 DIAGNOSIS — Z6823 Body mass index (BMI) 23.0-23.9, adult: Secondary | ICD-10-CM

## 2021-03-07 DIAGNOSIS — N179 Acute kidney failure, unspecified: Principal | ICD-10-CM | POA: Diagnosis present

## 2021-03-07 DIAGNOSIS — E441 Mild protein-calorie malnutrition: Secondary | ICD-10-CM | POA: Diagnosis not present

## 2021-03-07 DIAGNOSIS — Z28310 Unvaccinated for covid-19: Secondary | ICD-10-CM | POA: Diagnosis not present

## 2021-03-07 DIAGNOSIS — E875 Hyperkalemia: Secondary | ICD-10-CM | POA: Diagnosis not present

## 2021-03-07 DIAGNOSIS — R293 Abnormal posture: Secondary | ICD-10-CM | POA: Diagnosis not present

## 2021-03-07 DIAGNOSIS — R531 Weakness: Secondary | ICD-10-CM | POA: Diagnosis not present

## 2021-03-07 DIAGNOSIS — T502X5A Adverse effect of carbonic-anhydrase inhibitors, benzothiadiazides and other diuretics, initial encounter: Secondary | ICD-10-CM | POA: Diagnosis not present

## 2021-03-07 DIAGNOSIS — L89152 Pressure ulcer of sacral region, stage 2: Secondary | ICD-10-CM | POA: Diagnosis present

## 2021-03-07 DIAGNOSIS — Z833 Family history of diabetes mellitus: Secondary | ICD-10-CM | POA: Diagnosis not present

## 2021-03-07 DIAGNOSIS — Z7401 Bed confinement status: Secondary | ICD-10-CM | POA: Diagnosis not present

## 2021-03-07 DIAGNOSIS — Y92239 Unspecified place in hospital as the place of occurrence of the external cause: Secondary | ICD-10-CM | POA: Diagnosis not present

## 2021-03-07 DIAGNOSIS — G35 Multiple sclerosis: Secondary | ICD-10-CM | POA: Diagnosis present

## 2021-03-07 DIAGNOSIS — R339 Retention of urine, unspecified: Secondary | ICD-10-CM | POA: Diagnosis not present

## 2021-03-07 DIAGNOSIS — J45909 Unspecified asthma, uncomplicated: Secondary | ICD-10-CM | POA: Diagnosis present

## 2021-03-07 DIAGNOSIS — L8990 Pressure ulcer of unspecified site, unspecified stage: Secondary | ICD-10-CM | POA: Insufficient documentation

## 2021-03-07 DIAGNOSIS — E870 Hyperosmolality and hypernatremia: Secondary | ICD-10-CM | POA: Diagnosis not present

## 2021-03-07 DIAGNOSIS — M6281 Muscle weakness (generalized): Secondary | ICD-10-CM | POA: Diagnosis not present

## 2021-03-07 DIAGNOSIS — E871 Hypo-osmolality and hyponatremia: Secondary | ICD-10-CM | POA: Diagnosis not present

## 2021-03-07 DIAGNOSIS — R609 Edema, unspecified: Secondary | ICD-10-CM | POA: Diagnosis not present

## 2021-03-07 DIAGNOSIS — R110 Nausea: Secondary | ICD-10-CM | POA: Diagnosis not present

## 2021-03-07 DIAGNOSIS — R109 Unspecified abdominal pain: Secondary | ICD-10-CM | POA: Diagnosis not present

## 2021-03-07 DIAGNOSIS — Z20822 Contact with and (suspected) exposure to covid-19: Secondary | ICD-10-CM | POA: Diagnosis not present

## 2021-03-07 DIAGNOSIS — E876 Hypokalemia: Secondary | ICD-10-CM | POA: Diagnosis not present

## 2021-03-07 LAB — COMPREHENSIVE METABOLIC PANEL
ALT: 20 U/L (ref 0–44)
ALT: 17 U/L (ref 0–44)
AST: 18 U/L (ref 15–41)
AST: 17 U/L (ref 15–41)
Albumin: 3.5 g/dL (ref 3.5–5.0)
Albumin: 3 g/dL — ABNORMAL LOW (ref 3.5–5.0)
Alkaline Phosphatase: 89 U/L (ref 38–126)
Alkaline Phosphatase: 77 U/L (ref 38–126)
Anion gap: 27 — ABNORMAL HIGH (ref 5–15)
Anion gap: 25 — ABNORMAL HIGH (ref 5–15)
BUN: 216 mg/dL — ABNORMAL HIGH (ref 6–20)
BUN: 244 mg/dL — ABNORMAL HIGH (ref 6–20)
CO2: 13 mmol/L — ABNORMAL LOW (ref 22–32)
CO2: 13 mmol/L — ABNORMAL LOW (ref 22–32)
Calcium: 9.1 mg/dL (ref 8.9–10.3)
Calcium: 8.7 mg/dL — ABNORMAL LOW (ref 8.9–10.3)
Chloride: 88 mmol/L — ABNORMAL LOW (ref 98–111)
Chloride: 91 mmol/L — ABNORMAL LOW (ref 98–111)
Creatinine, Ser: 19.07 mg/dL — ABNORMAL HIGH (ref 0.44–1.00)
Creatinine, Ser: 19.62 mg/dL — ABNORMAL HIGH (ref 0.44–1.00)
GFR, Estimated: 2 mL/min — ABNORMAL LOW (ref >60)
GFR, Estimated: 2 mL/min — ABNORMAL LOW (ref >60)
Glucose, Bld: 104 mg/dL — ABNORMAL HIGH (ref 70–99)
Glucose, Bld: 104 mg/dL — ABNORMAL HIGH (ref 70–99)
Potassium: 7.1 mmol/L (ref 3.5–5.1)
Potassium: 7.3 mmol/L (ref 3.5–5.1)
Sodium: 128 mmol/L — ABNORMAL LOW (ref 135–145)
Sodium: 129 mmol/L — ABNORMAL LOW (ref 135–145)
Total Bilirubin: 0.5 mg/dL (ref 0.3–1.2)
Total Bilirubin: 0.7 mg/dL (ref 0.3–1.2)
Total Protein: 8.8 g/dL — ABNORMAL HIGH (ref 6.5–8.1)
Total Protein: 7.6 g/dL (ref 6.5–8.1)

## 2021-03-07 LAB — BLOOD GAS, VENOUS
Acid-base deficit: 9.5 mmol/L — ABNORMAL HIGH (ref 0.0–2.0)
Bicarbonate: 17.4 mmol/L — ABNORMAL LOW (ref 20.0–28.0)
FIO2: 21
O2 Saturation: 97.9 %
Patient temperature: 36.9
pCO2, Ven: 25.5 mmHg — ABNORMAL LOW (ref 44.0–60.0)
pH, Ven: 7.378 (ref 7.250–7.430)
pO2, Ven: 124 mmHg — ABNORMAL HIGH (ref 32.0–45.0)

## 2021-03-07 LAB — POC URINE PREG, ED: Preg Test, Ur: NEGATIVE

## 2021-03-07 LAB — URINALYSIS, ROUTINE W REFLEX MICROSCOPIC
Bilirubin Urine: NEGATIVE
Glucose, UA: NEGATIVE mg/dL
Ketones, ur: NEGATIVE mg/dL
Leukocytes,Ua: NEGATIVE
Nitrite: NEGATIVE
Protein, ur: NEGATIVE mg/dL
Specific Gravity, Urine: 1.015 (ref 1.005–1.030)
pH: 5 (ref 5.0–8.0)

## 2021-03-07 LAB — URINALYSIS, MICROSCOPIC (REFLEX)

## 2021-03-07 LAB — CBC
HCT: 32.9 % — ABNORMAL LOW (ref 36.0–46.0)
Hemoglobin: 11.5 g/dL — ABNORMAL LOW (ref 12.0–15.0)
MCH: 30.2 pg (ref 26.0–34.0)
MCHC: 35 g/dL (ref 30.0–36.0)
MCV: 86.4 fL (ref 80.0–100.0)
Platelets: 243 10*3/uL (ref 150–400)
RBC: 3.81 MIL/uL — ABNORMAL LOW (ref 3.87–5.11)
RDW: 13.2 % (ref 11.5–15.5)
WBC: 14.2 10*3/uL — ABNORMAL HIGH (ref 4.0–10.5)
nRBC: 0 % (ref 0.0–0.2)

## 2021-03-07 LAB — LIPASE, BLOOD: Lipase: 90 U/L — ABNORMAL HIGH (ref 11–51)

## 2021-03-07 MED ORDER — DEXTROSE 50 % IV SOLN
1.0000 | Freq: Once | INTRAVENOUS | Status: AC
  Administered 2021-03-07: 50 mL via INTRAVENOUS
  Filled 2021-03-07: qty 50

## 2021-03-07 MED ORDER — CALCIUM GLUCONATE-NACL 1-0.675 GM/50ML-% IV SOLN
1.0000 g | Freq: Once | INTRAVENOUS | Status: AC
  Administered 2021-03-07: 1000 mg via INTRAVENOUS
  Filled 2021-03-07: qty 50

## 2021-03-07 MED ORDER — INSULIN ASPART 100 UNIT/ML IV SOLN
5.0000 [IU] | Freq: Once | INTRAVENOUS | Status: AC
  Administered 2021-03-07: 5 [IU] via INTRAVENOUS

## 2021-03-07 MED ORDER — DEXTROSE 50 % IV SOLN
25.0000 g | Freq: Once | INTRAVENOUS | Status: AC
  Administered 2021-03-07: 25 g via INTRAVENOUS
  Filled 2021-03-07: qty 50

## 2021-03-07 MED ORDER — FUROSEMIDE 10 MG/ML IJ SOLN
40.0000 mg | Freq: Once | INTRAMUSCULAR | Status: AC
  Administered 2021-03-07: 40 mg via INTRAVENOUS
  Filled 2021-03-07: qty 4

## 2021-03-07 MED ORDER — MORPHINE SULFATE (PF) 4 MG/ML IV SOLN
4.0000 mg | Freq: Once | INTRAVENOUS | Status: AC
  Administered 2021-03-07: 4 mg via INTRAVENOUS
  Filled 2021-03-07: qty 1

## 2021-03-07 MED ORDER — SODIUM ZIRCONIUM CYCLOSILICATE 5 G PO PACK
10.0000 g | PACK | Freq: Once | ORAL | Status: AC
  Administered 2021-03-07: 10 g via ORAL
  Filled 2021-03-07: qty 2

## 2021-03-07 NOTE — ED Notes (Signed)
Date and time results received: 03/07/21 2055   Test: K+ Critical Value: 7.1  Name of Provider Notified: Audley Hose, MD

## 2021-03-07 NOTE — ED Triage Notes (Signed)
Pt c/o mid abd pain for the past 2 days with N/V. Pt denies hx of same. Pt is wheelchair bound at home due to MS.

## 2021-03-07 NOTE — ED Notes (Signed)
Date and time results received: 03/07/21 2141   Test: K+ Critical Value: 7.3  Name of Provider Notified: Audley Hose, MD

## 2021-03-07 NOTE — ED Provider Notes (Addendum)
[facility] EMERGENCY DEPARTMENT Provider Note   [name]: [id number] Arrival date & time: 03/07/21  1952     History Chief Complaint  Patient presents with   Abdominal Pain    Melissa West is a 30 y.o. female.  Patient presents to ER chief complaint of abdominal pain described as mid to lower abdominal region ongoing pain for 2 days.  She did have some vomiting episodes nonbloody.  Otherwise denies any fevers or cough denies any diarrhea.  She is wheelchair-bound due to history of severe MS.  Pain is described as sharp and aching and persistent for the past 2 days.      Past Medical History:  Diagnosis Date   MS (multiple sclerosis) (HCC)    No pertinent past medical history     Patient Active Problem List   Diagnosis Date Noted   Rash 03/29/2019   Wheelchair bound 11/29/2018   Ataxia 11/29/2018   Nystagmus 11/29/2018   Urinary incontinence, urge 11/29/2018   MS (multiple sclerosis) (HCC)    Neurological deficit present 11/11/2010    Past Surgical History:  Procedure Laterality Date   CESAREAN SECTION  08/09/2011   Procedure: CESAREAN SECTION;  Surgeon: Lesly Dukes, MD;  Location: [facility];  Service: Gynecology;  Laterality: N/A;     OB History     Gravida  1   Para  1   Term  1   Preterm  0   AB  0   Living  2      SAB  0   IAB  0   Ectopic  0   Multiple  1   Live Births  2           Family History  Problem Relation Age of Onset   Diabetes Maternal Grandmother    Hypertension Maternal Grandmother    Kidney disease Maternal Grandmother     Social History   Tobacco Use   Smoking status: Never   Smokeless tobacco: Never  Substance Use Topics   Alcohol use: No   Drug use: No    Home Medications Prior to Admission medications   Medication Sig Start Date End Date Taking? Authorizing Provider  albuterol (VENTOLIN HFA) 108 (90 Base) MCG/ACT inhaler Inhale 1-2 puffs into the lungs every 6 (six) hours as needed for wheezing or  shortness of breath. 02/23/21   Long, Arlyss Repress, MD  amantadine (SYMMETREL) 100 MG capsule Take 1 capsule (100 mg total) by mouth 2 (two) times daily. Patient not taking: No sig reported 02/22/19   Sater, Pearletha Furl, MD  benzonatate (TESSALON) 100 MG capsule Take 1 capsule (100 mg total) by mouth every 8 (eight) hours. 02/23/21   Long, Arlyss Repress, MD  fluticasone (FLONASE) 50 MCG/ACT nasal spray Place 2 sprays into both nostrils daily. 02/23/21   Long, Arlyss Repress, MD  GEMTESA 75 MG TABS Take 1 tablet by mouth daily. Patient not taking: Reported on 02/23/2021 01/28/21   [provider]  modafinil (PROVIGIL) 200 MG tablet Take 1 tablet (200 mg total) by mouth daily. Patient not taking: No sig reported 08/08/20   Sater, Pearletha Furl, MD  ondansetron (ZOFRAN ODT) 4 MG disintegrating tablet Take 1 tablet (4 mg total) by mouth every 8 (eight) hours as needed for nausea or vomiting. 02/23/21   Long, Arlyss Repress, MD  oseltamivir (TAMIFLU) 75 MG capsule Take 1 capsule (75 mg total) by mouth every 12 (twelve) hours. 02/23/21   Long, Arlyss Repress, MD  Ozanimod HCl (ZEPOSIA)  0.92 MG CAPS Take 0.92 mg by mouth daily. Patient not taking: Reported on 02/23/2021    [provider]  solifenacin (VESICARE) 10 MG tablet Take 1 tablet (10 mg total) by mouth daily. Patient not taking: Reported on 02/23/2021 11/29/18   Sater, Pearletha Furl, MD  triamcinolone (KENALOG) 0.025 % cream Apply 1 application topically as needed. Patient not taking: Reported on 02/23/2021 03/27/19   [provider]  Vitamin D, Ergocalciferol, (DRISDOL) 1.25 MG (50000 UNIT) CAPS capsule TAKE 1 CAPSULE BY MOUTH WEEKLY. 01/17/20   Sater, Pearletha Furl, MD    Allergies    Patient has no known allergies.  Review of Systems   Review of Systems  Constitutional:  Negative for fever.  HENT:  Negative for ear pain.   Eyes:  Negative for pain.  Respiratory:  Negative for cough.   Cardiovascular:  Negative for chest pain.  Gastrointestinal:   Positive for abdominal pain.  Genitourinary:  Negative for flank pain.  Musculoskeletal:  Negative for back pain.  Skin:  Negative for rash.  Neurological:  Negative for headaches.   Physical Exam Updated Vital Signs BP 125/81   Pulse 76   Temp 98.4 F (36.9 C)   Resp 12   Ht 5\' 2"  (1.575 m)   Wt 59 kg   SpO2 100%   BMI 23.78 kg/m   Physical Exam Constitutional:      General: She is not in acute distress.    Appearance: Normal appearance.  HENT:     Head: Normocephalic.     Nose: Nose normal.  Eyes:     Extraocular Movements: Extraocular movements intact.  Cardiovascular:     Rate and Rhythm: Normal rate.  Pulmonary:     Effort: Pulmonary effort is normal.  Abdominal:     Tenderness: There is abdominal tenderness in the suprapubic area.  Musculoskeletal:        General: Normal range of motion.     Cervical back: Normal range of motion.  Neurological:     General: No focal deficit present.     Mental Status: She is alert. Mental status is at baseline.    ED Results / Procedures / Treatments   Labs (all labs ordered are listed, but only abnormal results are displayed) Labs Reviewed  LIPASE, BLOOD - Abnormal; Notable for the following components:      Result Value   Lipase 90 (*)    All other components within normal limits  COMPREHENSIVE METABOLIC PANEL - Abnormal; Notable for the following components:   Sodium 128 (*)    Potassium 7.1 (*)    Chloride 88 (*)    CO2 13 (*)    Glucose, Bld 104 (*)    BUN 216 (*)    Creatinine, Ser 19.07 (*)    Total Protein 8.8 (*)    GFR, Estimated 2 (*)    Anion gap 27 (*)    All other components within normal limits  CBC - Abnormal; Notable for the following components:   WBC 14.2 (*)    RBC 3.81 (*)    Hemoglobin 11.5 (*)    HCT 32.9 (*)    All other components within normal limits  URINALYSIS, ROUTINE W REFLEX MICROSCOPIC - Abnormal; Notable for the following components:   Hgb urine dipstick MODERATE (*)    All  other components within normal limits  COMPREHENSIVE METABOLIC PANEL - Abnormal; Notable for the following components:   Sodium 129 (*)    Potassium 7.3 (*)  Chloride 91 (*)    CO2 13 (*)    Glucose, Bld 104 (*)    BUN 244 (*)    Creatinine, Ser 19.62 (*)    Calcium 8.7 (*)    Albumin 3.0 (*)    GFR, Estimated 2 (*)    Anion gap 25 (*)    All other components within normal limits  URINALYSIS, MICROSCOPIC (REFLEX) - Abnormal; Notable for the following components:   Bacteria, UA RARE (*)    All other components within normal limits  BASIC METABOLIC PANEL  POC URINE PREG, ED    EKG EKG Interpretation  Date/Time:  Saturday March 07 2021 21:17:58 EST Ventricular Rate:  77 PR Interval:  131 QRS Duration: 89 QT Interval:  390 QTC Calculation: 442 R Axis:   93 Text Interpretation: Sinus rhythm Borderline right axis deviation Baseline wander in lead(s) V4 V5 Confirmed by Norman Clay ([id number]) on 03/07/2021 9:46:14 PM  Radiology CT ABDOMEN PELVIS WO CONTRAST  Result Date: 03/07/2021 CLINICAL DATA:  Abdominal pain. EXAM: CT ABDOMEN AND PELVIS WITHOUT CONTRAST TECHNIQUE: Multidetector CT imaging of the abdomen and pelvis was performed following the standard protocol without IV contrast. COMPARISON:  None. FINDINGS: Lower chest: No acute abnormality. Hepatobiliary: No focal liver abnormality is seen. Subcentimeter gallstones are seen within an otherwise normal appearing gallbladder. Pancreas: Unremarkable. No pancreatic ductal dilatation or surrounding inflammatory changes. Spleen: Normal in size without focal abnormality. Adrenals/Urinary Tract: Adrenal glands are unremarkable. Both kidneys are enlarged, without focal lesions. Mild bilateral hydronephrosis and hydroureter are present. A Foley catheter is seen within the urinary bladder. Stomach/Bowel: Stomach is within normal limits. The appendix is not clearly identified. No evidence of bowel wall thickening, distention, or inflammatory  changes. Vascular/Lymphatic: No significant vascular findings are present. No enlarged abdominal or pelvic lymph nodes. Reproductive: Uterus and bilateral adnexa are unremarkable. Other: No abdominal wall hernia or abnormality. Moderate severity, diffuse presacral inflammatory fat stranding is noted. No abdominopelvic ascites. Musculoskeletal: No acute or significant osseous findings. IMPRESSION: 1. Mild bilateral hydronephrosis and hydroureter, which may be secondary to the presence of a Foley catheter within the urinary bladder. 2. Cholelithiasis. Electronically Signed   By: Aram Candela M.D.   On: 03/07/2021 22:46    Procedures .Critical Care Performed by: Cheryll Cockayne, MD Authorized by: Cheryll Cockayne, MD   Critical care provider statement:    Critical care time (minutes):  40   Critical care time was exclusive of:  Separately billable procedures and treating other patients   Critical care was necessary to treat or prevent imminent or life-threatening deterioration of the following conditions:  Metabolic crisis and renal failure   Medications Ordered in ED Medications  morphine 4 MG/ML injection 4 mg (4 mg Intravenous Given 03/07/21 2113)  calcium gluconate 1 g/ 50 mL sodium chloride IVPB (0 mg Intravenous Stopped 03/07/21 2239)  furosemide (LASIX) injection 40 mg (40 mg Intravenous Given 03/07/21 2203)  sodium zirconium cyclosilicate (LOKELMA) packet 10 g (10 g Oral Given 03/07/21 2202)  insulin aspart (novoLOG) injection 5 Units (5 Units Intravenous Given 03/07/21 2203)    And  dextrose 50 % solution 50 mL (50 mLs Intravenous Given 03/07/21 2202)  dextrose 50 % solution 25 g (25 g Intravenous Given 03/07/21 2202)    ED Course  I have reviewed the triage vital signs and the nursing notes.  Pertinent labs & imaging results that were available during my care of the patient were reviewed by me and considered in my medical  decision making (see chart for details).  Clinical Course  as of 03/07/21 2300  Sat Mar 07, 2021  2100 Glucose(!): 104 [JH]    Clinical Course User Index [JH] Audley Hose Eustace Moore, MD   MDM Rules/Calculators/A&P                           Labs sent.  White count 14 hemoglobin 11.  Chemistry severely abnormal with potassium of 7.3, creatinine of 19 BUN of 244.  Patient appears to be in acute kidney injury.  Foley catheter placed with subsequent output of greater than 3500 cc of urine.  Consultation with urology Dr.Patel, who will see the patient tomorrow.  Patient given Lasix insulin D50 and Lokelma.  Final Clinical Impression(s) / ED Diagnoses Final diagnoses:  Acute renal failure, unspecified acute renal failure type (HCC)  Hyperkalemia  Acute retention of urine    Rx / DC Orders ED Discharge Orders     None        Cheryll Cockayne, MD 03/07/21 2300    Cheryll Cockayne, MD 03/07/21 2315

## 2021-03-07 NOTE — ED Notes (Signed)
Date and time results received: 03/07/21 2130   Test: BUN Critical Value: 216  Name of Provider Notified: Audley Hose, MD

## 2021-03-08 ENCOUNTER — Encounter (HOSPITAL_COMMUNITY): Payer: Self-pay | Admitting: Internal Medicine

## 2021-03-08 ENCOUNTER — Other Ambulatory Visit: Payer: Self-pay

## 2021-03-08 DIAGNOSIS — R338 Other retention of urine: Secondary | ICD-10-CM | POA: Diagnosis present

## 2021-03-08 DIAGNOSIS — E875 Hyperkalemia: Secondary | ICD-10-CM | POA: Diagnosis present

## 2021-03-08 DIAGNOSIS — D72829 Elevated white blood cell count, unspecified: Secondary | ICD-10-CM | POA: Diagnosis present

## 2021-03-08 DIAGNOSIS — N179 Acute kidney failure, unspecified: Principal | ICD-10-CM

## 2021-03-08 DIAGNOSIS — L8990 Pressure ulcer of unspecified site, unspecified stage: Secondary | ICD-10-CM | POA: Insufficient documentation

## 2021-03-08 DIAGNOSIS — E441 Mild protein-calorie malnutrition: Secondary | ICD-10-CM | POA: Diagnosis present

## 2021-03-08 LAB — RESP PANEL BY RT-PCR (FLU A&B, COVID) ARPGX2
Influenza A by PCR: NEGATIVE
Influenza B by PCR: NEGATIVE
SARS Coronavirus 2 by RT PCR: NEGATIVE

## 2021-03-08 LAB — BASIC METABOLIC PANEL
Anion gap: 24 — ABNORMAL HIGH (ref 5–15)
Anion gap: 20 — ABNORMAL HIGH (ref 5–15)
Anion gap: 10 (ref 5–15)
BUN: 224 mg/dL — ABNORMAL HIGH (ref 6–20)
BUN: 168 mg/dL — ABNORMAL HIGH (ref 6–20)
BUN: 112 mg/dL — ABNORMAL HIGH (ref 6–20)
CO2: 14 mmol/L — ABNORMAL LOW (ref 22–32)
CO2: 16 mmol/L — ABNORMAL LOW (ref 22–32)
CO2: 24 mmol/L (ref 22–32)
Calcium: 9.4 mg/dL (ref 8.9–10.3)
Calcium: 8.6 mg/dL — ABNORMAL LOW (ref 8.9–10.3)
Calcium: 9 mg/dL (ref 8.9–10.3)
Chloride: 94 mmol/L — ABNORMAL LOW (ref 98–111)
Chloride: 106 mmol/L (ref 98–111)
Chloride: 113 mmol/L — ABNORMAL HIGH (ref 98–111)
Creatinine, Ser: 15.42 mg/dL — ABNORMAL HIGH (ref 0.44–1.00)
Creatinine, Ser: 6.89 mg/dL — ABNORMAL HIGH (ref 0.44–1.00)
Creatinine, Ser: 2.79 mg/dL — ABNORMAL HIGH (ref 0.44–1.00)
GFR, Estimated: 3 mL/min — ABNORMAL LOW (ref >60)
GFR, Estimated: 8 mL/min — ABNORMAL LOW (ref >60)
GFR, Estimated: 22 mL/min — ABNORMAL LOW (ref >60)
Glucose, Bld: 167 mg/dL — ABNORMAL HIGH (ref 70–99)
Glucose, Bld: 144 mg/dL — ABNORMAL HIGH (ref 70–99)
Glucose, Bld: 186 mg/dL — ABNORMAL HIGH (ref 70–99)
Potassium: 4.6 mmol/L (ref 3.5–5.1)
Potassium: 3.1 mmol/L — ABNORMAL LOW (ref 3.5–5.1)
Potassium: 2.9 mmol/L — ABNORMAL LOW (ref 3.5–5.1)
Sodium: 132 mmol/L — ABNORMAL LOW (ref 135–145)
Sodium: 142 mmol/L (ref 135–145)
Sodium: 147 mmol/L — ABNORMAL HIGH (ref 135–145)

## 2021-03-08 LAB — COMPREHENSIVE METABOLIC PANEL
ALT: 18 U/L (ref 0–44)
AST: 16 U/L (ref 15–41)
Albumin: 3.4 g/dL — ABNORMAL LOW (ref 3.5–5.0)
Alkaline Phosphatase: 84 U/L (ref 38–126)
Anion gap: 23 — ABNORMAL HIGH (ref 5–15)
BUN: 194 mg/dL — ABNORMAL HIGH (ref 6–20)
CO2: 16 mmol/L — ABNORMAL LOW (ref 22–32)
Calcium: 9.7 mg/dL (ref 8.9–10.3)
Chloride: 103 mmol/L (ref 98–111)
Creatinine, Ser: 9.08 mg/dL — ABNORMAL HIGH (ref 0.44–1.00)
GFR, Estimated: 5 mL/min — ABNORMAL LOW (ref >60)
Glucose, Bld: 150 mg/dL — ABNORMAL HIGH (ref 70–99)
Potassium: 3.6 mmol/L (ref 3.5–5.1)
Sodium: 142 mmol/L (ref 135–145)
Total Bilirubin: 0.9 mg/dL (ref 0.3–1.2)
Total Protein: 8.7 g/dL — ABNORMAL HIGH (ref 6.5–8.1)

## 2021-03-08 LAB — CBC
HCT: 34.3 % — ABNORMAL LOW (ref 36.0–46.0)
HCT: 33.2 % — ABNORMAL LOW (ref 36.0–46.0)
Hemoglobin: 11.8 g/dL — ABNORMAL LOW (ref 12.0–15.0)
Hemoglobin: 11.4 g/dL — ABNORMAL LOW (ref 12.0–15.0)
MCH: 29.4 pg (ref 26.0–34.0)
MCH: 30.1 pg (ref 26.0–34.0)
MCHC: 34.4 g/dL (ref 30.0–36.0)
MCHC: 34.3 g/dL (ref 30.0–36.0)
MCV: 85.5 fL (ref 80.0–100.0)
MCV: 87.6 fL (ref 80.0–100.0)
Platelets: 247 10*3/uL (ref 150–400)
Platelets: 238 10*3/uL (ref 150–400)
RBC: 4.01 MIL/uL (ref 3.87–5.11)
RBC: 3.79 MIL/uL — ABNORMAL LOW (ref 3.87–5.11)
RDW: 13 % (ref 11.5–15.5)
RDW: 13.2 % (ref 11.5–15.5)
WBC: 13.9 10*3/uL — ABNORMAL HIGH (ref 4.0–10.5)
WBC: 10.8 10*3/uL — ABNORMAL HIGH (ref 4.0–10.5)
nRBC: 0 % (ref 0.0–0.2)
nRBC: 0 % (ref 0.0–0.2)

## 2021-03-08 LAB — PHOSPHORUS: Phosphorus: 8.3 mg/dL — ABNORMAL HIGH (ref 2.5–4.6)

## 2021-03-08 LAB — MAGNESIUM
Magnesium: 3 mg/dL — ABNORMAL HIGH (ref 1.7–2.4)
Magnesium: 2.6 mg/dL — ABNORMAL HIGH (ref 1.7–2.4)

## 2021-03-08 LAB — HIV ANTIBODY (ROUTINE TESTING W REFLEX): HIV Screen 4th Generation wRfx: NONREACTIVE

## 2021-03-08 MED ORDER — ONDANSETRON HCL 4 MG PO TABS: 4.0000 mg | ORAL_TABLET | Freq: Four times a day (QID) | ORAL | Status: DC | PRN

## 2021-03-08 MED ORDER — OXYCODONE HCL 5 MG PO TABS
5.0000 mg | ORAL_TABLET | ORAL | Status: DC | PRN
  Administered 2021-03-08 – 2021-03-09 (×2): 5 mg via ORAL
  Filled 2021-03-08 (×3): qty 1

## 2021-03-08 MED ORDER — STERILE WATER FOR INJECTION IV SOLN
INTRAVENOUS | Status: DC
  Filled 2021-03-08 (×2): qty 1000

## 2021-03-08 MED ORDER — POTASSIUM CHLORIDE CRYS ER 20 MEQ PO TBCR
20.0000 meq | EXTENDED_RELEASE_TABLET | Freq: Three times a day (TID) | ORAL | Status: AC
  Administered 2021-03-08 – 2021-03-09 (×4): 20 meq via ORAL
  Filled 2021-03-08 (×4): qty 1

## 2021-03-08 MED ORDER — ACETAMINOPHEN 325 MG PO TABS
650.0000 mg | ORAL_TABLET | Freq: Four times a day (QID) | ORAL | Status: DC | PRN
  Administered 2021-03-08: 17:00:00 650 mg via ORAL
  Filled 2021-03-08: qty 2

## 2021-03-08 MED ORDER — SODIUM CHLORIDE 0.9 % IV SOLN: INTRAVENOUS | Status: DC

## 2021-03-08 MED ORDER — HEPARIN SODIUM (PORCINE) 5000 UNIT/ML IJ SOLN
5000.0000 [IU] | Freq: Three times a day (TID) | INTRAMUSCULAR | Status: DC
  Administered 2021-03-08 – 2021-03-11 (×11): 5000 [IU] via SUBCUTANEOUS
  Filled 2021-03-08 (×10): qty 1

## 2021-03-08 MED ORDER — CHLORHEXIDINE GLUCONATE CLOTH 2 % EX PADS
6.0000 | MEDICATED_PAD | Freq: Every day | CUTANEOUS | Status: DC
  Administered 2021-03-08 – 2021-03-11 (×4): 6 via TOPICAL

## 2021-03-08 MED ORDER — ACETAMINOPHEN 650 MG RE SUPP: 650.0000 mg | Freq: Four times a day (QID) | RECTAL | Status: DC | PRN

## 2021-03-08 MED ORDER — LACTATED RINGERS IV BOLUS
1000.0000 mL | INTRAVENOUS | Status: AC
  Administered 2021-03-08 (×2): 1000 mL via INTRAVENOUS

## 2021-03-08 MED ORDER — FLUTICASONE PROPIONATE 50 MCG/ACT NA SUSP
2.0000 | Freq: Every day | NASAL | Status: DC
  Administered 2021-03-08 – 2021-03-10 (×3): 2 via NASAL
  Filled 2021-03-08: qty 16

## 2021-03-08 MED ORDER — ALBUTEROL SULFATE HFA 108 (90 BASE) MCG/ACT IN AERS: 1.0000 | INHALATION_SPRAY | Freq: Four times a day (QID) | RESPIRATORY_TRACT | Status: DC | PRN

## 2021-03-08 MED ORDER — ONDANSETRON HCL 4 MG/2ML IJ SOLN
4.0000 mg | Freq: Four times a day (QID) | INTRAMUSCULAR | Status: DC | PRN
  Administered 2021-03-08: 13:00:00 4 mg via INTRAVENOUS
  Filled 2021-03-08: qty 2

## 2021-03-08 NOTE — H&P (Signed)
TRH H&P    Patient Demographics:    Melissa West, is a 30 y.o. female  MRN: [id number]  DOB - 1990-06-05  Admit Date - 03/07/2021  Referring MD/NP/PA: Audley Hose  Outpatient Primary MD for the patient is Benita Stabile, MD  Patient coming from: Home  Chief complaint- Abdominal pain   HPI:    Melissa West  is a 30 y.o. female, with history of multiple sclerosis and asthma, presents the ED with a chief complaint of abdominal pain.  Patient is a very poor historian, and seems to have difficulty comprehending the questions.  It is unclear if this is her baseline, but with her BUN and creatinine being so elevated its entirely possible that she is altered.  Reports 2 days of abdominal pain.  She reports the pains are sharp and felt like she had to have a bowel movement.  Been progressively worse since it started.  She reports the pains were in her lower abdomen.  She has had urinary frequency, but only with small trickles voided.  She is not sure if she has had hematuria.  She is not sure when her last normal void was.  Her last normal bowel movement was 1-2 days ago.  Her last normal meal was 1-2 days ago.  She has had a decreased appetite because her abdomen has been so painful.  She denies any fevers.  She has had nausea and vomiting 1-2 times per day.  No hematemesis.  Patient has no other complaints at this time.  Of note patient does report her grandmother was on dialysis. Patient does have MS, wheelchair-bound, according to patient she has intermittent voluntary movement of her lower extremities, but at the time of my exam it seems to be only movement is muscle spasms.  Patient does not smoke, does not drink, is not vaccinated for COVID.  Patient is full code.  In the ED Temp 98.4, heart rate 75-70, respiratory rate 12-23, blood pressure 141/95 at the highest, 125/81 at the lowest, satting in  100% Leukocytosis 14.2, slightly anemic with hemoglobin 11.5 Hyponatremic 129, hyperkalemic 7.3, hypochloremic 91, decreased bicarb 13, elevated BUN 244, elevated creatinine 19.62, glucose normal Albumin 3.0 Negative pregnancy test UA shows hematuria CT abdomen pelvis shows mild bilateral hydronephrosis and hydroureter with Foley catheter in place Patient was given calcium gluconate, D50, insulin, Lasix, and Lokelma Patient was given morphine for pain Nephro was consulted and agreed with the current treatment plan, and advised the patient would be a candidate for transfer to [facility]    Review of systems:    In addition to the HPI above,  No Fever-chills, No Headache, No changes with Vision or hearing, No problems swallowing food or Liquids, No Chest pain, Cough or Shortness of Breath, No dysuria, No new skin rashes or bruises, No new joints pains-aches,  No new new weakness, tingling, numbness in any extremity, No recent weight gain or loss, No polyuria, polydypsia or polyphagia, No significant Mental Stressors.  All other systems reviewed and are negative.    Past History  of the following :    Past Medical History:  Diagnosis Date   MS (multiple sclerosis) (HCC)    No pertinent past medical history       Past Surgical History:  Procedure Laterality Date   CESAREAN SECTION  08/09/2011   Procedure: CESAREAN SECTION;  Surgeon: Lesly Dukes, MD;  Location: [facility];  Service: Gynecology;  Laterality: N/A;      Social History:      Social History   Tobacco Use   Smoking status: Never   Smokeless tobacco: Never  Substance Use Topics   Alcohol use: No       Family History :     Family History  Problem Relation Age of Onset   Diabetes Maternal Grandmother    Hypertension Maternal Grandmother    Kidney disease Maternal Grandmother       Home Medications:   Prior to Admission medications   Medication Sig Start Date End Date Taking? Authorizing  Provider  albuterol (VENTOLIN HFA) 108 (90 Base) MCG/ACT inhaler Inhale 1-2 puffs into the lungs every 6 (six) hours as needed for wheezing or shortness of breath. 02/23/21   Long, Arlyss Repress, MD  amantadine (SYMMETREL) 100 MG capsule Take 1 capsule (100 mg total) by mouth 2 (two) times daily. Patient not taking: No sig reported 02/22/19   Sater, Pearletha Furl, MD  benzonatate (TESSALON) 100 MG capsule Take 1 capsule (100 mg total) by mouth every 8 (eight) hours. 02/23/21   Long, Arlyss Repress, MD  fluticasone (FLONASE) 50 MCG/ACT nasal spray Place 2 sprays into both nostrils daily. 02/23/21   Long, Arlyss Repress, MD  GEMTESA 75 MG TABS Take 1 tablet by mouth daily. Patient not taking: Reported on 02/23/2021 01/28/21   [provider]  modafinil (PROVIGIL) 200 MG tablet Take 1 tablet (200 mg total) by mouth daily. Patient not taking: No sig reported 08/08/20   Sater, Pearletha Furl, MD  ondansetron (ZOFRAN ODT) 4 MG disintegrating tablet Take 1 tablet (4 mg total) by mouth every 8 (eight) hours as needed for nausea or vomiting. 02/23/21   Long, Arlyss Repress, MD  oseltamivir (TAMIFLU) 75 MG capsule Take 1 capsule (75 mg total) by mouth every 12 (twelve) hours. 02/23/21   Long, Arlyss Repress, MD  Ozanimod HCl (ZEPOSIA) 0.92 MG CAPS Take 0.92 mg by mouth daily. Patient not taking: Reported on 02/23/2021    [provider]  solifenacin (VESICARE) 10 MG tablet Take 1 tablet (10 mg total) by mouth daily. Patient not taking: Reported on 02/23/2021 11/29/18   Sater, Pearletha Furl, MD  triamcinolone (KENALOG) 0.025 % cream Apply 1 application topically as needed. Patient not taking: Reported on 02/23/2021 03/27/19   [provider]  Vitamin D, Ergocalciferol, (DRISDOL) 1.25 MG (50000 UNIT) CAPS capsule TAKE 1 CAPSULE BY MOUTH WEEKLY. 01/17/20   Sater, Pearletha Furl, MD     Allergies:    No Known Allergies   Physical Exam:   Vitals  Blood pressure 125/81, pulse 76, temperature 98.4 F (36.9 C), resp. rate 12,  height 5\' 2"  (1.575 m), weight 59 kg, SpO2 100 %.   1.  General: Patient lying supine in bed,  no acute distress   2. Psychiatric: Alert and oriented x 3, mood and behavior normal for situation, pleasant and cooperative with exam   3. Neurologic: Speech and language are normal, face is symmetric, bilateral upper extremities voluntarily, bilateral lower extremities with muscle spasms,, at baseline without acute deficits on limited exam  4. HEENMT:  Head is atraumatic, normocephalic, pupils reactive to light, neck is supple, trachea is midline, mucous membranes are very dry   5. Respiratory : Lungs are clear to auscultation bilaterally without wheezing, rhonchi, rales, no cyanosis, no increase in work of breathing or accessory muscle use   6. Cardiovascular : Heart rate normal, rhythm is regular, no murmurs, rubs or gallops, no peripheral edema, peripheral pulses palpated   7. Gastrointestinal:  Abdomen is soft, nondistended, nontender to palpation bowel sounds active, no masses or organomegaly palpated   8. Skin:  Skin is warm, dry and intact without rashes, acute lesions, or ulcers on limited exam   9.Musculoskeletal:  No acute deformities or trauma, no asymmetry in tone, no peripheral edema, peripheral pulses palpated, no tenderness to palpation in the extremities     Data Review:    CBC Recent Labs  Lab 03/07/21 2000  WBC 14.2*  HGB 11.5*  HCT 32.9*  PLT 243  MCV 86.4  MCH 30.2  MCHC 35.0  RDW 13.2   ------------------------------------------------------------------------------------------------------------------  Results for orders placed or performed during the hospital encounter of 03/07/21 (from the past 48 hour(s))  Lipase, blood     Status: Abnormal   Collection Time: 03/07/21  8:00 PM  Result Value Ref Range   Lipase 90 (H) 11 - 51 U/L    Comment: Performed at [facility], [address] [place], [place] [address]  Comprehensive metabolic panel      Status: Abnormal   Collection Time: 03/07/21  8:00 PM  Result Value Ref Range   Sodium 128 (L) 135 - 145 mmol/L   Potassium 7.1 (HH) 3.5 - 5.1 mmol/L    Comment: CRITICAL RESULT CALLED TO, READ BACK BY AND VERIFIED WITH: SAPPELT,J @ 2055 ON 03/07/21 BY JUW    Chloride 88 (L) 98 - 111 mmol/L   CO2 13 (L) 22 - 32 mmol/L   Glucose, Bld 104 (H) 70 - 99 mg/dL    Comment: Glucose reference range applies only to samples taken after fasting for at least 8 hours.   BUN 216 (H) 6 - 20 mg/dL    Comment: RESULTS CONFIRMED BY MANUAL DILUTION CRITICAL RESULT CALLED TO, READ BACK BY AND VERIFIED WITH: NICHOLS,K 2130 03/07/2021 COLEMAN,R    Creatinine, Ser 19.07 (H) 0.44 - 1.00 mg/dL   Calcium 9.1 8.9 - 60.410.3 mg/dL   Total Protein 8.8 (H) 6.5 - 8.1 g/dL   Albumin 3.5 3.5 - 5.0 g/dL   AST 18 15 - 41 U/L   ALT 20 0 - 44 U/L   Alkaline Phosphatase 89 38 - 126 U/L   Total Bilirubin 0.5 0.3 - 1.2 mg/dL   GFR, Estimated 2 (L) >60 mL/min    Comment: (NOTE) Calculated using the CKD-EPI Creatinine Equation (2021)    Anion gap 27 (H) 5 - 15    Comment: Performed at [facility], [address] [place], [place] [address] CORRECTED ON 11/26 AT 2131: PREVIOUSLY REPORTED AS 27 Electrolytes repeated to confirm.   CBC     Status: Abnormal   Collection Time: 03/07/21  8:00 PM  Result Value Ref Range   WBC 14.2 (H) 4.0 - 10.5 K/uL   RBC 3.81 (L) 3.87 - 5.11 MIL/uL   Hemoglobin 11.5 (L) 12.0 - 15.0 g/dL   HCT 11.932.9 (L) 14.736.0 - 82.946.0 %   MCV 86.4 80.0 - 100.0 fL   MCH 30.2 26.0 - 34.0 pg   MCHC 35.0 30.0 - 36.0 g/dL  RDW 13.2 11.5 - 15.5 %   Platelets 243 150 - 400 K/uL   nRBC 0.0 0.0 - 0.2 %    Comment: Performed at [facility], [address], [place], [place] [address]  Comprehensive metabolic panel     Status: Abnormal   Collection Time: 03/07/21  8:59 PM  Result Value Ref Range   Sodium 129 (L) 135 - 145 mmol/L   Potassium 7.3 (HH) 3.5 - 5.1 mmol/L    Comment: SPECIMEN FREE OF HEMOLYSIS CRITICAL  RESULT CALLED TO, READ BACK BY AND VERIFIED WITH: SAPPLET,J 2141 03/07/2021 COLEMAN,R    Chloride 91 (L) 98 - 111 mmol/L   CO2 13 (L) 22 - 32 mmol/L   Glucose, Bld 104 (H) 70 - 99 mg/dL    Comment: Glucose reference range applies only to samples taken after fasting for at least 8 hours.   BUN 244 (H) 6 - 20 mg/dL    Comment: RESULTS CONFIRMED BY MANUAL DILUTION   Creatinine, Ser 19.62 (H) 0.44 - 1.00 mg/dL   Calcium 8.7 (L) 8.9 - 10.3 mg/dL   Total Protein 7.6 6.5 - 8.1 g/dL   Albumin 3.0 (L) 3.5 - 5.0 g/dL   AST 17 15 - 41 U/L   ALT 17 0 - 44 U/L   Alkaline Phosphatase 77 38 - 126 U/L   Total Bilirubin 0.7 0.3 - 1.2 mg/dL   GFR, Estimated 2 (L) >60 mL/min    Comment: (NOTE) Calculated using the CKD-EPI Creatinine Equation (2021)    Anion gap 25 (H) 5 - 15    Comment: Performed at [facility], [address], [place], [place] [address]  Urinalysis, Routine w reflex microscopic Urine, Catheterized     Status: Abnormal   Collection Time: 03/07/21 10:00 PM  Result Value Ref Range   Color, Urine YELLOW YELLOW   APPearance CLEAR CLEAR   Specific Gravity, Urine 1.015 1.005 - 1.030   pH 5.0 5.0 - 8.0   Glucose, UA NEGATIVE NEGATIVE mg/dL   Hgb urine dipstick MODERATE (A) NEGATIVE   Bilirubin Urine NEGATIVE NEGATIVE   Ketones, ur NEGATIVE NEGATIVE mg/dL   Protein, ur NEGATIVE NEGATIVE mg/dL   Nitrite NEGATIVE NEGATIVE   Leukocytes,Ua NEGATIVE NEGATIVE    Comment: Performed at [facility], [address] [place], [place] [address]  Urinalysis, Microscopic (reflex)     Status: Abnormal   Collection Time: 03/07/21 10:00 PM  Result Value Ref Range   RBC / HPF 0-5 0 - 5 RBC/hpf   WBC, UA 0-5 0 - 5 WBC/hpf   Bacteria, UA RARE (A) NONE SEEN   Squamous Epithelial / LPF 0-5 0 - 5    Comment: Performed at [facility], [address] [place], [place] [address]  POC urine preg, ED     Status: None   Collection Time: 03/07/21 10:05 PM  Result Value Ref Range   Preg Test, Ur NEGATIVE  NEGATIVE    Comment:        THE SENSITIVITY OF THIS METHODOLOGY IS >24 mIU/mL   Blood gas, venous     Status: Abnormal   Collection Time: 03/07/21 11:24 PM  Result Value Ref Range   FIO2 21.00    pH, Ven 7.378 7.250 - 7.430   pCO2, Ven 25.5 (L) 44.0 - 60.0 mmHg   pO2, Ven 124.0 (H) 32.0 - 45.0 mmHg   Bicarbonate 17.4 (L) 20.0 - 28.0 mmol/L   Acid-base deficit 9.5 (H) 0.0 - 2.0 mmol/L   O2 Saturation 97.9 %  Patient temperature 36.9    Collection site RIGHT ANTECUBITAL    Drawn by TS    Sample type VENOUS     Comment: Performed at [facility], [address], [place], [place] [address]    Chemistries  Recent Labs  Lab 03/07/21 2000 03/07/21 2059  NA 128* 129*  K 7.1* 7.3*  CL 88* 91*  CO2 13* 13*  GLUCOSE 104* 104*  BUN 216* 244*  CREATININE 19.07* 19.62*  CALCIUM 9.1 8.7*  AST 18 17  ALT 20 17  ALKPHOS 89 77  BILITOT 0.5 0.7   ------------------------------------------------------------------------------------------------------------------  ------------------------------------------------------------------------------------------------------------------ GFR: Estimated Creatinine Clearance: 3.3 mL/min (A) (by C-G formula based on SCr of 19.62 mg/dL (H)). Liver Function Tests: Recent Labs  Lab 03/07/21 2000 03/07/21 2059  AST 18 17  ALT 20 17  ALKPHOS 89 77  BILITOT 0.5 0.7  PROT 8.8* 7.6  ALBUMIN 3.5 3.0*   Recent Labs  Lab 03/07/21 2000  LIPASE 90*   No results for input(s): AMMONIA in the last 168 hours. Coagulation Profile: No results for input(s): INR, PROTIME in the last 168 hours. Cardiac Enzymes: No results for input(s): CKTOTAL, CKMB, CKMBINDEX, TROPONINI in the last 168 hours. BNP (last 3 results) No results for input(s): PROBNP in the last 8760 hours. HbA1C: No results for input(s): HGBA1C in the last 72 hours. CBG: No results for input(s): GLUCAP in the last 168 hours. Lipid Profile: No results for input(s): CHOL, HDL, LDLCALC,  TRIG, CHOLHDL, LDLDIRECT in the last 72 hours. Thyroid Function Tests: No results for input(s): TSH, T4TOTAL, FREET4, T3FREE, THYROIDAB in the last 72 hours. Anemia Panel: No results for input(s): VITAMINB12, FOLATE, FERRITIN, TIBC, IRON, RETICCTPCT in the last 72 hours.  --------------------------------------------------------------------------------------------------------------- Urine analysis:    Component Value Date/Time   COLORURINE YELLOW 03/07/2021 2200   APPEARANCEUR CLEAR 03/07/2021 2200   LABSPEC 1.015 03/07/2021 2200   PHURINE 5.0 03/07/2021 2200   GLUCOSEU NEGATIVE 03/07/2021 2200   HGBUR MODERATE (A) 03/07/2021 2200   BILIRUBINUR NEGATIVE 03/07/2021 2200   KETONESUR NEGATIVE 03/07/2021 2200   PROTEINUR NEGATIVE 03/07/2021 2200   UROBILINOGEN 0.2 06/13/2009 1128   NITRITE NEGATIVE 03/07/2021 2200   LEUKOCYTESUR NEGATIVE 03/07/2021 2200      Imaging Results:    CT ABDOMEN PELVIS WO CONTRAST  Result Date: 03/07/2021 CLINICAL DATA:  Abdominal pain. EXAM: CT ABDOMEN AND PELVIS WITHOUT CONTRAST TECHNIQUE: Multidetector CT imaging of the abdomen and pelvis was performed following the standard protocol without IV contrast. COMPARISON:  None. FINDINGS: Lower chest: No acute abnormality. Hepatobiliary: No focal liver abnormality is seen. Subcentimeter gallstones are seen within an otherwise normal appearing gallbladder. Pancreas: Unremarkable. No pancreatic ductal dilatation or surrounding inflammatory changes. Spleen: Normal in size without focal abnormality. Adrenals/Urinary Tract: Adrenal glands are unremarkable. Both kidneys are enlarged, without focal lesions. Mild bilateral hydronephrosis and hydroureter are present. A Foley catheter is seen within the urinary bladder. Stomach/Bowel: Stomach is within normal limits. The appendix is not clearly identified. No evidence of bowel wall thickening, distention, or inflammatory changes. Vascular/Lymphatic: No significant vascular  findings are present. No enlarged abdominal or pelvic lymph nodes. Reproductive: Uterus and bilateral adnexa are unremarkable. Other: No abdominal wall hernia or abnormality. Moderate severity, diffuse presacral inflammatory fat stranding is noted. No abdominopelvic ascites. Musculoskeletal: No acute or significant osseous findings. IMPRESSION: 1. Mild bilateral hydronephrosis and hydroureter, which may be secondary to the presence of a Foley catheter within the urinary bladder. 2. Cholelithiasis. Electronically Signed   By: Hoover Browns  Houston M.D.   On: 03/07/2021 22:46    My personal review of EKG: Rhythm shows sinus rhythm, heart rate 77, QTc 442, peaked T waves   Assessment & Plan:    Principal Problem:   AKI (acute kidney injury) ([place]) Active Problems:   Hyperkalemia   Leukocytosis   Mild protein-calorie malnutrition (HCC)   Acute urinary retention   AKI Creatinine 12 days ago 0.7, GFR 12 days ago greater than 60, BUN 12 days ago at 10 Creatinine today 19.62, BUN today 244, GFR today to Patient also has hyperkalemia and decreased bicarb Postrenal obstruction likely from bladder spasms secondary to MS Foley catheter in place IV hydration Monitor Is and Os Nephro consulted from the ED Continue to monitor Hyperkalemia Patient was given D50, insulin, Lasix, calcium gluconate, and Lokelma in the ED Initial potassium was 7.3, repeat pending 2/2 AKI Monitor on tele Acute urinary retention 2/2 MS Foley in place Continue to monitor Leukocytosis WBC 14.2 Likely acute phase reactant Urine culture pending No other signs or symptoms of infection Continue to monitor Mild protein cal mal When patient is able to tolerate PO, encourage nutrient dense food options. Asthma Not in exacerbation Continue PRN albuterol    DVT Prophylaxis-   Heparin - SCDs   AM Labs Ordered, also please review Full Orders  Family Communication: No family at bedside  Code Status:  FULL  Admission  status: Inpatient :The appropriate admission status for this patient is INPATIENT. Inpatient status is judged to be reasonable and necessary in order to provide the required intensity of service to ensure the patient's safety. The patient's presenting symptoms, physical exam findings, and initial radiographic and laboratory data in the context of their chronic comorbidities is felt to place them at high risk for further clinical deterioration. Furthermore, it is not anticipated that the patient will be medically stable for discharge from the hospital within 2 midnights of admission. The following factors support the admission status of inpatient.     The patient's presenting symptoms include Nausea, vomiting, abdominal pain. The worrisome physical exam findings include dry mucous membranes. The initial radiographic and laboratory data are worrisome because of hydronephrosis. The chronic co-morbidities include MS, asthma       * I certify that at the point of admission it is my clinical judgment that the patient will require inpatient hospital care spanning beyond 2 midnights from the point of admission due to high intensity of service, high risk for further deterioration and high frequency of surveillance required.*  Disposition: Anticipated Discharge date 72 hours, discharge to home  Time spent in minutes : Newborn DO

## 2021-03-08 NOTE — Progress Notes (Signed)
PROGRESS NOTE    Melissa West  [id number] DOB: Aug 19, 1990 DOA: 03/07/2021 PCP: Celene Squibb, MD    Brief Narrative:  30 year old female with history of multiple sclerosis, presents to the hospital with complaints of abdominal pain.  Noted to have acute renal failure with a creatinine of 19 on admission.  Foley catheter placed and she expelled 3.5 L of urine.  Overall renal function has began to improve with decompression.  She is continued on IV fluids for renal recovery.   Assessment & Plan:   Principal Problem:   AKI (acute kidney injury) ([place]) Active Problems:   MS (multiple sclerosis) (H[facility])   Hyperkalemia   Leukocytosis   Mild protein-calorie malnutrition (HCC)   Acute urinary retention   Pressure injury of skin   Acute kidney injury -Baseline creatinine is normal -Admission creatinine noted to be 19.62 and BUN of 244 -CT abdomen showed mild bilateral hydronephrosis and hydroureter.  Images were taken after Foley catheter placement -Per ER records, once Foley catheter placed, she immediately expelled 3.5 L of urine -No mention of obstructing stones on imaging -Suspect that she may have neurogenic bladder related to multiple sclerosis.  She was also on medications for overactive bladder prior to admission -We will need to continue with Foley catheter decompression -Monitor for postobstructive diuresis and continue aggressive hydration -We will discuss with urology, anticipate that she will need outpatient follow-up  Hyperkalemia -Related to renal failure -Treated medically, now resolved  Acute urinary retention -Resolved after placement of Foley catheter -Likely related to multiple sclerosis, she was also on medications for overactive bladder prior to admission  Leukocytosis -Urine culture in process, although suspect this is acute phase reactant since is already improving  Asthma -Respiratory status stable -Continue as needed albuterol  Generalized  weakness -Patient currently living at home -Mother reports that she usually uses a wheelchair but is able to transfer from bed to chair on her own -She has been increasingly weak lately -We will request physical therapy evaluation  Multiple sclerosis -Last note from neurology noted to be in 07/2019 -It does not appear that she is on any active treatments at this time -We will need follow-up with her primary neurologist to discuss this further -At baseline, appears that she has bilateral leg weakness and spasticity.  Pressure ulcer Pressure Injury 03/08/21 Sacrum Medial Stage 2 -  Partial thickness loss of dermis presenting as a shallow open injury with a red, pink wound bed without slough. 1 by 1 stage 2 area (Active)  03/08/21 0913  Location: Sacrum  Location Orientation: Medial  Staging: Stage 2 -  Partial thickness loss of dermis presenting as a shallow open injury with a red, pink wound bed without slough.  Wound Description (Comments): 1 by 1 stage 2 area  Present on Admission: Yes  Continue supportive care       DVT prophylaxis: heparin injection 5,000 Units Start: 03/08/21 0045 SCDs Start: 03/08/21 0042  Code Status: Full code Family Communication: Updated patient's mother over the phone Disposition Plan: Status is: Inpatient  Remains inpatient appropriate because: Continued IV fluids for renal failure         Consultants:    Procedures:    Antimicrobials:      Subjective: She feels her abdominal pain is better.  Denies any shortness of breath.  Objective: Vitals:   03/08/21 0730 03/08/21 0800 03/08/21 0832 03/08/21 0852  BP: 112/85 120/81 101/81   Pulse:  (!) 108 (!) 103   Resp: 12 15 16  Temp:  97.8 F (36.6 C) 98 F (36.7 C)   TempSrc:  Oral Oral   SpO2:  100% 100%   Weight:    60.4 kg  Height:    5\' 2"  (1.575 m)    Intake/Output Summary (Last 24 hours) at 03/08/2021 1421 Last data filed at 03/08/2021 1300 Gross per 24 hour  Intake  170 ml  Output 7575 ml  Net -7405 ml   Filed Weights   03/07/21 1954 03/08/21 0852  Weight: 59 kg 60.4 kg    Examination:  General exam: Appears calm and comfortable  Respiratory system: Clear to auscultation. Respiratory effort normal. Cardiovascular system: S1 & S2 heard, RRR. No JVD, murmurs, rubs, gallops or clicks. No pedal edema.     Data Reviewed: I have personally reviewed following labs and imaging studies  CBC: Recent Labs  Lab 03/07/21 2000 03/08/21 0543 03/08/21 0806  WBC 14.2* 13.9* 10.8*  HGB 11.5* 11.8* 11.4*  HCT 32.9* 34.3* 33.2*  MCV 86.4 85.5 87.6  PLT 243 247 [id number]   Basic Metabolic Panel: Recent Labs  Lab 03/07/21 2000 03/07/21 2059 03/07/21 2324 03/08/21 0543 03/08/21 0806  NA 128* 129* 132* 142 142  K 7.1* 7.3* 4.6 3.6 3.1*  CL 88* 91* 94* 103 106  CO2 13* 13* 14* 16* 16*  GLUCOSE 104* 104* 167* 150* 144*  BUN 216* 244* 224* 194* 168*  CREATININE 19.07* 19.62* 15.42* 9.08* 6.89*  CALCIUM 9.1 8.7* 9.4 9.7 8.6*  MG  --   --   --  3.0*  --   PHOS  --   --   --  8.3*  --    GFR: Estimated Creatinine Clearance: 10 mL/min (A) (by C-G formula based on SCr of 6.89 mg/dL (H)). Liver Function Tests: Recent Labs  Lab 03/07/21 2000 03/07/21 2059 03/08/21 0543  AST 18 17 16   ALT 20 17 18   ALKPHOS 89 77 84  BILITOT 0.5 0.7 0.9  PROT 8.8* 7.6 8.7*  ALBUMIN 3.5 3.0* 3.4*   Recent Labs  Lab 03/07/21 2000  LIPASE 90*   No results for input(s): AMMONIA in the last 168 hours. Coagulation Profile: No results for input(s): INR, PROTIME in the last 168 hours. Cardiac Enzymes: No results for input(s): CKTOTAL, CKMB, CKMBINDEX, TROPONINI in the last 168 hours. BNP (last 3 results) No results for input(s): PROBNP in the last 8760 hours. HbA1C: No results for input(s): HGBA1C in the last 72 hours. CBG: No results for input(s): GLUCAP in the last 168 hours. Lipid Profile: No results for input(s): CHOL, HDL, LDLCALC, TRIG, CHOLHDL, LDLDIRECT  in the last 72 hours. Thyroid Function Tests: No results for input(s): TSH, T4TOTAL, FREET4, T3FREE, THYROIDAB in the last 72 hours. Anemia Panel: No results for input(s): VITAMINB12, FOLATE, FERRITIN, TIBC, IRON, RETICCTPCT in the last 72 hours. Sepsis Labs: No results for input(s): PROCALCITON, LATICACIDVEN in the last 168 hours.  Recent Results (from the past 240 hour(s))  Resp Panel by RT-PCR (Flu A&B, Covid) Nasopharyngeal Swab     Status: None   Collection Time: 03/07/21 10:10 PM   Specimen: Nasopharyngeal Swab; Nasopharyngeal(NP) swabs in vial transport medium  Result Value Ref Range Status   SARS Coronavirus 2 by RT PCR NEGATIVE NEGATIVE Final    Comment: (NOTE) SARS-CoV-2 target nucleic acids are NOT DETECTED.  The SARS-CoV-2 RNA is generally detectable in upper respiratory specimens during the acute phase of infection. The lowest concentration of SARS-CoV-2 viral copies this assay can detect is 138 copies/mL. A  negative result does not preclude SARS-Cov-2 infection and should not be used as the sole basis for treatment or other patient management decisions. A negative result may occur with  improper specimen collection/handling, submission of specimen other than nasopharyngeal swab, presence of viral mutation(s) within the areas targeted by this assay, and inadequate number of viral copies(<138 copies/mL). A negative result must be combined with clinical observations, patient history, and epidemiological information. The expected result is Negative.  Fact Sheet for Patients:  BloggerCourse.com  Fact Sheet for Healthcare Providers:  SeriousBroker.it  This test is no t yet approved or cleared by the [place] FDA and  has been authorized for detection and/or diagnosis of SARS-CoV-2 by FDA under an Emergency Use Authorization (EUA). This EUA will remain  in effect (meaning this test can be used) for the duration of  the COVID-19 declaration under Section 564(b)(1) of the Act, 21 U.S.C.section 360bbb-3(b)(1), unless the authorization is terminated  or revoked sooner.       Influenza A by PCR NEGATIVE NEGATIVE Final   Influenza B by PCR NEGATIVE NEGATIVE Final    Comment: (NOTE) The Xpert Xpress SARS-CoV-2/FLU/RSV plus assay is intended as an aid in the diagnosis of influenza from Nasopharyngeal swab specimens and should not be used as a sole basis for treatment. Nasal washings and aspirates are unacceptable for Xpert Xpress SARS-CoV-2/FLU/RSV testing.  Fact Sheet for Patients: BloggerCourse.com  Fact Sheet for Healthcare Providers: SeriousBroker.it  This test is not yet approved or cleared by the [place] FDA and has been authorized for detection and/or diagnosis of SARS-CoV-2 by FDA under an Emergency Use Authorization (EUA). This EUA will remain in effect (meaning this test can be used) for the duration of the COVID-19 declaration under Section 564(b)(1) of the Act, 21 U.S.C. section 360bbb-3(b)(1), unless the authorization is terminated or revoked.  Performed at [facility], [address], [place], [place] [address]          Radiology Studies: CT ABDOMEN PELVIS WO CONTRAST  Result Date: 03/07/2021 CLINICAL DATA:  Abdominal pain. EXAM: CT ABDOMEN AND PELVIS WITHOUT CONTRAST TECHNIQUE: Multidetector CT imaging of the abdomen and pelvis was performed following the standard protocol without IV contrast. COMPARISON:  None. FINDINGS: Lower chest: No acute abnormality. Hepatobiliary: No focal liver abnormality is seen. Subcentimeter gallstones are seen within an otherwise normal appearing gallbladder. Pancreas: Unremarkable. No pancreatic ductal dilatation or surrounding inflammatory changes. Spleen: Normal in size without focal abnormality. Adrenals/Urinary Tract: Adrenal glands are unremarkable. Both kidneys are enlarged, without focal  lesions. Mild bilateral hydronephrosis and hydroureter are present. A Foley catheter is seen within the urinary bladder. Stomach/Bowel: Stomach is within normal limits. The appendix is not clearly identified. No evidence of bowel wall thickening, distention, or inflammatory changes. Vascular/Lymphatic: No significant vascular findings are present. No enlarged abdominal or pelvic lymph nodes. Reproductive: Uterus and bilateral adnexa are unremarkable. Other: No abdominal wall hernia or abnormality. Moderate severity, diffuse presacral inflammatory fat stranding is noted. No abdominopelvic ascites. Musculoskeletal: No acute or significant osseous findings. IMPRESSION: 1. Mild bilateral hydronephrosis and hydroureter, which may be secondary to the presence of a Foley catheter within the urinary bladder. 2. Cholelithiasis. Electronically Signed   By: Aram Candela M.D.   On: 03/07/2021 22:46        Scheduled Meds:  Chlorhexidine Gluconate Cloth  6 each Topical Daily   fluticasone  2 spray Each Nare Daily   heparin  5,000 Units Subcutaneous Q8H   potassium chloride  20 mEq Oral  TID   Continuous Infusions:   sodium bicarbonate (isotonic) infusion in sterile water 100 mL/hr at 03/08/21 1105     LOS: 1 day    Time spent: 35 minutes    Kathie Dike, MD [facility]   If 7PM-7AM, please contact night-coverage [email or web address]  03/08/2021, 2:21 PM

## 2021-03-09 DIAGNOSIS — N179 Acute kidney failure, unspecified: Secondary | ICD-10-CM | POA: Diagnosis not present

## 2021-03-09 DIAGNOSIS — R338 Other retention of urine: Secondary | ICD-10-CM | POA: Diagnosis not present

## 2021-03-09 DIAGNOSIS — E870 Hyperosmolality and hypernatremia: Secondary | ICD-10-CM | POA: Diagnosis not present

## 2021-03-09 DIAGNOSIS — G35 Multiple sclerosis: Secondary | ICD-10-CM

## 2021-03-09 DIAGNOSIS — E875 Hyperkalemia: Secondary | ICD-10-CM | POA: Diagnosis not present

## 2021-03-09 LAB — BASIC METABOLIC PANEL
Anion gap: 7 (ref 5–15)
BUN: 32 mg/dL — ABNORMAL HIGH (ref 6–20)
CO2: 28 mmol/L (ref 22–32)
Calcium: 8.3 mg/dL — ABNORMAL LOW (ref 8.9–10.3)
Chloride: 109 mmol/L (ref 98–111)
Creatinine, Ser: 0.83 mg/dL (ref 0.44–1.00)
GFR, Estimated: >60 mL/min (ref >60)
Glucose, Bld: 109 mg/dL — ABNORMAL HIGH (ref 70–99)
Potassium: 3.8 mmol/L (ref 3.5–5.1)
Sodium: 144 mmol/L (ref 135–145)

## 2021-03-09 LAB — RENAL FUNCTION PANEL
Albumin: 2.7 g/dL — ABNORMAL LOW (ref 3.5–5.0)
Anion gap: 9 (ref 5–15)
BUN: 72 mg/dL — ABNORMAL HIGH (ref 6–20)
CO2: 33 mmol/L — ABNORMAL HIGH (ref 22–32)
Calcium: 8.9 mg/dL (ref 8.9–10.3)
Chloride: 111 mmol/L (ref 98–111)
Creatinine, Ser: 1.5 mg/dL — ABNORMAL HIGH (ref 0.44–1.00)
GFR, Estimated: 47 mL/min — ABNORMAL LOW (ref >60)
Glucose, Bld: 114 mg/dL — ABNORMAL HIGH (ref 70–99)
Phosphorus: 2.8 mg/dL (ref 2.5–4.6)
Potassium: 3.3 mmol/L — ABNORMAL LOW (ref 3.5–5.1)
Sodium: 153 mmol/L — ABNORMAL HIGH (ref 135–145)

## 2021-03-09 LAB — URINE CULTURE: Culture: NO GROWTH

## 2021-03-09 MED ORDER — SODIUM CHLORIDE 0.45 % IV BOLUS
1000.0000 mL | INTRAVENOUS | Status: AC
  Administered 2021-03-09 (×2): 1000 mL via INTRAVENOUS

## 2021-03-09 MED ORDER — POTASSIUM CL IN DEXTROSE 5% 20 MEQ/L IV SOLN
20.0000 meq | INTRAVENOUS | Status: DC
  Administered 2021-03-09 – 2021-03-10 (×5): 20 meq via INTRAVENOUS
  Filled 2021-03-09: qty 1000

## 2021-03-09 NOTE — Progress Notes (Signed)
PROGRESS NOTE    Melissa West  [id number] DOB: 07/16/1990 DOA: 03/07/2021 PCP: Celene Squibb, MD    Brief Narrative:  30 year old female with history of multiple sclerosis, presents to the hospital with complaints of abdominal pain.  Noted to have acute renal failure with a creatinine of 19 on admission.  Foley catheter placed and she expelled 3.5 L of urine.  Overall renal function has began to improve with decompression.  She is continued on IV fluids for renal recovery.   Assessment & Plan:   Principal Problem:   AKI (acute kidney injury) ([place]) Active Problems:   MS (multiple sclerosis) ([facility])   Hyperkalemia   Leukocytosis   Mild protein-calorie malnutrition ([facility])   Acute urinary retention   Pressure injury of skin   Hypernatremia   Acute kidney injury -Baseline creatinine is normal -Admission creatinine noted to be 19.62 and BUN of 244 -CT abdomen showed mild bilateral hydronephrosis and hydroureter.  Images were taken after Foley catheter placement -Per ER records, once Foley catheter placed, she immediately expelled 3.5 L of urine -No mention of obstructing stones on imaging -Suspect that she may have neurogenic bladder related to multiple sclerosis.  She was also on medications for overactive bladder prior to admission -We will need to continue with Foley catheter decompression -Creatinine is now down to 1.5 -Continue aggressive IV hydration for postobstructive diuresis.  She is putting out approximately 5 to 6 L of urine per day.  Hyperkalemia -Related to renal failure -Treated medically, now resolved -Patient is now hypokalemic and potassium is being replaced  Hypernatremia -Secondary to postobstructive diuresis and high volume urine output -Continue IV hydration with hypotonic fluids  Acute urinary retention -Resolved after placement of Foley catheter -Likely related to multiple sclerosis, she was also on medications for overactive bladder prior to  admission -Discussed with urology, Dr. Alyson Ingles who recommended continued Foley catheter on discharge with outpatient follow-up with urology in the next 1 to 2 weeks -Urology referral has been made  Leukocytosis -Urine culture in process, although suspect this is acute phase reactant since is already improving  Asthma -Respiratory status stable -Continue as needed albuterol  Generalized weakness -Patient currently living at home -Mother reports that she usually uses a wheelchair but is able to transfer from bed to chair on her own -She has been increasingly weak lately -We will request physical therapy evaluation  Multiple sclerosis -Last note from neurology noted to be in 07/2019 -It does not appear that she is on any active treatments at this time -Will need follow-up with her primary neurologist to discuss this further.  Referral has been made. -At baseline, appears that she has bilateral leg weakness and spasticity.  Pressure ulcer Pressure Injury 03/08/21 Sacrum Medial Stage 2 -  Partial thickness loss of dermis presenting as a shallow open injury with a red, pink wound bed without slough. 1 by 1 stage 2 area (Active)  03/08/21 0913  Location: Sacrum  Location Orientation: Medial  Staging: Stage 2 -  Partial thickness loss of dermis presenting as a shallow open injury with a red, pink wound bed without slough.  Wound Description (Comments): 1 by 1 stage 2 area  Present on Admission: Yes  Continue supportive care       DVT prophylaxis: heparin injection 5,000 Units Start: 03/08/21 0045 SCDs Start: 03/08/21 0042  Code Status: Full code Family Communication: Updated patient's mother over the phone Disposition Plan: Status is: Inpatient  Remains inpatient appropriate because: Continued IV fluids for renal  failure         Consultants:    Procedures:    Antimicrobials:      Subjective: She says she is feeling better today.  She does complain of some  right-sided chest discomfort, also some pain in her back.  Feels as though this may be related to laying in bed.  Objective: Vitals:   03/08/21 1948 03/09/21 0000 03/09/21 0300 03/09/21 0448  BP: 106/69 99/82  108/73  Pulse: (!) 114 92  87  Resp: 16 19  16   Temp: 98.6 F (37 C) 99.1 F (37.3 C)  98.1 F (36.7 C)  TempSrc: Oral Oral  Oral  SpO2: 97% 100%  100%  Weight:   58.5 kg   Height:        Intake/Output Summary (Last 24 hours) at 03/09/2021 1200 Last data filed at 03/09/2021 0930 Gross per 24 hour  Intake 3815.6 ml  Output 5100 ml  Net -1284.4 ml   Filed Weights   03/07/21 1954 03/08/21 0852 03/09/21 0300  Weight: 59 kg 60.4 kg 58.5 kg    Examination:  General exam: Alert, awake, oriented x 3 Respiratory system: Clear to auscultation. Respiratory effort normal. Cardiovascular system:RRR. No murmurs, rubs, gallops. Gastrointestinal system: Abdomen is nondistended, soft and nontender. No organomegaly or masses felt. Normal bowel sounds heard. Central nervous system: Alert and oriented. No focal neurological deficits. Extremities: No C/C/E, +pedal pulses Skin: No rashes, lesions or ulcers Psychiatry: Judgement and insight appear normal. Mood & affect appropriate.       Data Reviewed: I have personally reviewed following labs and imaging studies  CBC: Recent Labs  Lab 03/07/21 2000 03/08/21 0543 03/08/21 0806  WBC 14.2* 13.9* 10.8*  HGB 11.5* 11.8* 11.4*  HCT 32.9* 34.3* 33.2*  MCV 86.4 85.5 87.6  PLT 243 247 [id number]   Basic Metabolic Panel: Recent Labs  Lab 03/07/21 2324 03/08/21 0543 03/08/21 0806 03/08/21 1735 03/09/21 0402  NA 132* 142 142 147* 153*  K 4.6 3.6 3.1* 2.9* 3.3*  CL 94* 103 106 113* 111  CO2 14* 16* 16* 24 33*  GLUCOSE 167* 150* 144* 186* 114*  BUN 224* 194* 168* 112* 72*  CREATININE 15.42* 9.08* 6.89* 2.79* 1.50*  CALCIUM 9.4 9.7 8.6* 9.0 8.9  MG  --  3.0*  --  2.6*  --   PHOS  --  8.3*  --   --  2.8   GFR: Estimated  Creatinine Clearance: 42.6 mL/min (A) (by C-G formula based on SCr of 1.5 mg/dL (H)). Liver Function Tests: Recent Labs  Lab 03/07/21 2000 03/07/21 2059 03/08/21 0543 03/09/21 0402  AST 18 17 16   --   ALT 20 17 18   --   ALKPHOS 89 77 84  --   BILITOT 0.5 0.7 0.9  --   PROT 8.8* 7.6 8.7*  --   ALBUMIN 3.5 3.0* 3.4* 2.7*   Recent Labs  Lab 03/07/21 2000  LIPASE 90*   No results for input(s): AMMONIA in the last 168 hours. Coagulation Profile: No results for input(s): INR, PROTIME in the last 168 hours. Cardiac Enzymes: No results for input(s): CKTOTAL, CKMB, CKMBINDEX, TROPONINI in the last 168 hours. BNP (last 3 results) No results for input(s): PROBNP in the last 8760 hours. HbA1C: No results for input(s): HGBA1C in the last 72 hours. CBG: No results for input(s): GLUCAP in the last 168 hours. Lipid Profile: No results for input(s): CHOL, HDL, LDLCALC, TRIG, CHOLHDL, LDLDIRECT in the last 72 hours. Thyroid Function  Tests: No results for input(s): TSH, T4TOTAL, FREET4, T3FREE, THYROIDAB in the last 72 hours. Anemia Panel: No results for input(s): VITAMINB12, FOLATE, FERRITIN, TIBC, IRON, RETICCTPCT in the last 72 hours. Sepsis Labs: No results for input(s): PROCALCITON, LATICACIDVEN in the last 168 hours.  Recent Results (from the past 240 hour(s))  Resp Panel by RT-PCR (Flu A&B, Covid) Nasopharyngeal Swab     Status: None   Collection Time: 03/07/21 10:10 PM   Specimen: Nasopharyngeal Swab; Nasopharyngeal(NP) swabs in vial transport medium  Result Value Ref Range Status   SARS Coronavirus 2 by RT PCR NEGATIVE NEGATIVE Final    Comment: (NOTE) SARS-CoV-2 target nucleic acids are NOT DETECTED.  The SARS-CoV-2 RNA is generally detectable in upper respiratory specimens during the acute phase of infection. The lowest concentration of SARS-CoV-2 viral copies this assay can detect is 138 copies/mL. A negative result does not preclude SARS-Cov-2 infection and should not  be used as the sole basis for treatment or other patient management decisions. A negative result may occur with  improper specimen collection/handling, submission of specimen other than nasopharyngeal swab, presence of viral mutation(s) within the areas targeted by this assay, and inadequate number of viral copies(<138 copies/mL). A negative result must be combined with clinical observations, patient history, and epidemiological information. The expected result is Negative.  Fact Sheet for Patients:  BloggerCourse.com  Fact Sheet for Healthcare Providers:  SeriousBroker.it  This test is no t yet approved or cleared by the [place] FDA and  has been authorized for detection and/or diagnosis of SARS-CoV-2 by FDA under an Emergency Use Authorization (EUA). This EUA will remain  in effect (meaning this test can be used) for the duration of the COVID-19 declaration under Section 564(b)(1) of the Act, 21 U.S.C.section 360bbb-3(b)(1), unless the authorization is terminated  or revoked sooner.       Influenza A by PCR NEGATIVE NEGATIVE Final   Influenza B by PCR NEGATIVE NEGATIVE Final    Comment: (NOTE) The Xpert Xpress SARS-CoV-2/FLU/RSV plus assay is intended as an aid in the diagnosis of influenza from Nasopharyngeal swab specimens and should not be used as a sole basis for treatment. Nasal washings and aspirates are unacceptable for Xpert Xpress SARS-CoV-2/FLU/RSV testing.  Fact Sheet for Patients: BloggerCourse.com  Fact Sheet for Healthcare Providers: SeriousBroker.it  This test is not yet approved or cleared by the [place] FDA and has been authorized for detection and/or diagnosis of SARS-CoV-2 by FDA under an Emergency Use Authorization (EUA). This EUA will remain in effect (meaning this test can be used) for the duration of the COVID-19 declaration under Section  564(b)(1) of the Act, 21 U.S.C. section 360bbb-3(b)(1), unless the authorization is terminated or revoked.  Performed at [facility], [address], [place], [place] [address]          Radiology Studies: CT ABDOMEN PELVIS WO CONTRAST  Result Date: 03/07/2021 CLINICAL DATA:  Abdominal pain. EXAM: CT ABDOMEN AND PELVIS WITHOUT CONTRAST TECHNIQUE: Multidetector CT imaging of the abdomen and pelvis was performed following the standard protocol without IV contrast. COMPARISON:  None. FINDINGS: Lower chest: No acute abnormality. Hepatobiliary: No focal liver abnormality is seen. Subcentimeter gallstones are seen within an otherwise normal appearing gallbladder. Pancreas: Unremarkable. No pancreatic ductal dilatation or surrounding inflammatory changes. Spleen: Normal in size without focal abnormality. Adrenals/Urinary Tract: Adrenal glands are unremarkable. Both kidneys are enlarged, without focal lesions. Mild bilateral hydronephrosis and hydroureter are present. A Foley catheter is seen within the urinary bladder. Stomach/Bowel: Stomach  is within normal limits. The appendix is not clearly identified. No evidence of bowel wall thickening, distention, or inflammatory changes. Vascular/Lymphatic: No significant vascular findings are present. No enlarged abdominal or pelvic lymph nodes. Reproductive: Uterus and bilateral adnexa are unremarkable. Other: No abdominal wall hernia or abnormality. Moderate severity, diffuse presacral inflammatory fat stranding is noted. No abdominopelvic ascites. Musculoskeletal: No acute or significant osseous findings. IMPRESSION: 1. Mild bilateral hydronephrosis and hydroureter, which may be secondary to the presence of a Foley catheter within the urinary bladder. 2. Cholelithiasis. Electronically Signed   By: Virgina Norfolk M.D.   On: 03/07/2021 22:46        Scheduled Meds:  Chlorhexidine Gluconate Cloth  6 each Topical Daily   fluticasone  2 spray Each Nare Daily    heparin  5,000 Units Subcutaneous Q8H   potassium chloride  20 mEq Oral TID   Continuous Infusions:  dextrose 5 % with KCl 20 mEq / L 20 mEq (03/09/21 1035)     LOS: 2 days    Time spent: 35 minutes    Kathie Dike, MD [facility]   If 7PM-7AM, please contact night-coverage [email or web address]  03/09/2021, 12:00 PM

## 2021-03-09 NOTE — Plan of Care (Signed)
°  Problem: Education: °Goal: Knowledge of General Education information will improve °Description: Including pain rating scale, medication(s)/side effects and non-pharmacologic comfort measures °Outcome: Not Progressing °  °Problem: Health Behavior/Discharge Planning: °Goal: Ability to manage health-related needs will improve °Outcome: Not Progressing °  °Problem: Clinical Measurements: °Goal: Ability to maintain clinical measurements within normal limits will improve °Outcome: Progressing °Goal: Will remain free from infection °Outcome: Progressing °Goal: Diagnostic test results will improve °Outcome: Progressing °Goal: Respiratory complications will improve °Outcome: Progressing °Goal: Cardiovascular complication will be avoided °Outcome: Progressing °  °Problem: Activity: °Goal: Risk for activity intolerance will decrease °Outcome: Not Progressing °  °Problem: Nutrition: °Goal: Adequate nutrition will be maintained °Outcome: Progressing °  °Problem: Coping: °Goal: Level of anxiety will decrease °Outcome: Progressing °  °Problem: Elimination: °Goal: Will not experience complications related to bowel motility °Outcome: Progressing °Goal: Will not experience complications related to urinary retention °Outcome: Progressing °  °Problem: Pain Managment: °Goal: General experience of comfort will improve °Outcome: Progressing °  °Problem: Safety: °Goal: Ability to remain free from injury will improve °Outcome: Progressing °  °Problem: Skin Integrity: °Goal: Risk for impaired skin integrity will decrease °Outcome: Not Progressing °  °

## 2021-03-10 DIAGNOSIS — N179 Acute kidney failure, unspecified: Secondary | ICD-10-CM | POA: Diagnosis not present

## 2021-03-10 DIAGNOSIS — E875 Hyperkalemia: Secondary | ICD-10-CM | POA: Diagnosis not present

## 2021-03-10 DIAGNOSIS — G35 Multiple sclerosis: Secondary | ICD-10-CM | POA: Diagnosis not present

## 2021-03-10 DIAGNOSIS — R338 Other retention of urine: Secondary | ICD-10-CM | POA: Diagnosis not present

## 2021-03-10 LAB — BASIC METABOLIC PANEL
Anion gap: 5 (ref 5–15)
BUN: 19 mg/dL (ref 6–20)
CO2: 29 mmol/L (ref 22–32)
Calcium: 8.1 mg/dL — ABNORMAL LOW (ref 8.9–10.3)
Chloride: 106 mmol/L (ref 98–111)
Creatinine, Ser: 0.8 mg/dL (ref 0.44–1.00)
GFR, Estimated: >60 mL/min (ref >60)
Glucose, Bld: 115 mg/dL — ABNORMAL HIGH (ref 70–99)
Potassium: 3.8 mmol/L (ref 3.5–5.1)
Sodium: 140 mmol/L (ref 135–145)

## 2021-03-10 MED ORDER — POTASSIUM CL IN DEXTROSE 5% 20 MEQ/L IV SOLN
20.0000 meq | INTRAVENOUS | Status: DC
  Administered 2021-03-10 – 2021-03-11 (×2): 20 meq via INTRAVENOUS

## 2021-03-10 NOTE — Plan of Care (Signed)

## 2021-03-10 NOTE — [place] FL2 (Signed)
[place] MEDICAID FL2 LEVEL OF CARE SCREENING TOOL     IDENTIFICATION  Patient Name: Melissa West Birthdate: November 18, 1990 Sex: female Admission Date (Current Location): 03/07/2021  [facility] [place] Number:  [employer] and Address:  [facility],  [address] [place] [address]      Provider Number: [id number]  Attending Physician Name and Address:  Erick Blinks, MD  Relative Name and Phone Number:  Berlinda Last - Mother [phone]    Current Level of Care: Hospital Recommended Level of Care: Skilled Nursing Facility Prior Approval Number:    Date Approved/Denied:   PASRR Number: [id number]  Discharge Plan: SNF    Current Diagnoses: Patient Active Problem List   Diagnosis Date Noted   Hypernatremia 03/09/2021   Hyperkalemia 03/08/2021   Leukocytosis 03/08/2021   Mild protein-calorie malnutrition (HCC) 03/08/2021   Acute urinary retention 03/08/2021   Pressure injury of skin 03/08/2021   AKI (acute kidney injury) (HCC) 03/07/2021   Rash 03/29/2019   Wheelchair bound 11/29/2018   Ataxia 11/29/2018   Nystagmus 11/29/2018   Urinary incontinence, urge 11/29/2018   MS (multiple sclerosis) (HCC)    Neurological deficit present 11/11/2010    Orientation RESPIRATION BLADDER Height & Weight     Self, Time, Situation, Place  Normal Indwelling catheter Weight: 64.5 kg Height:  5\' 2"  (157.5 cm)  BEHAVIORAL SYMPTOMS/MOOD NEUROLOGICAL BOWEL NUTRITION STATUS      Continent Diet (See DC Summary)  AMBULATORY STATUS COMMUNICATION OF NEEDS Skin   Total Care Verbally Normal                       Personal Care Assistance Level of Assistance  Bathing, Feeding, Dressing Bathing Assistance: Maximum assistance Feeding assistance: Independent Dressing Assistance: Limited assistance     Functional Limitations Info  Sight, Hearing, Speech Sight Info: Impaired Hearing Info: Adequate Speech Info: Adequate    SPECIAL CARE  FACTORS FREQUENCY  PT (By licensed PT)     PT Frequency: 5 times a week              Contractures Contractures Info: Not present    Additional Factors Info  Code Status, Allergies Code Status Info: FULL Allergies Info: NKDA           Current Medications (03/10/2021):  This is the current hospital active medication list Current Facility-Administered Medications  Medication Dose Route Frequency Provider Last Rate Last Admin   acetaminophen (TYLENOL) tablet 650 mg  650 mg Oral Q6H PRN Zierle-Ghosh, Asia B, DO   650 mg at 03/08/21 1721   Or   acetaminophen (TYLENOL) suppository 650 mg  650 mg Rectal Q6H PRN Zierle-Ghosh, Asia B, DO       albuterol (VENTOLIN HFA) 108 (90 Base) MCG/ACT inhaler 1-2 puff  1-2 puff Inhalation Q6H PRN Zierle-Ghosh, Asia B, DO       Chlorhexidine Gluconate Cloth 2 % PADS 6 each  6 each Topical Daily 03/10/21, MD   6 each at 03/10/21 0918   dextrose 5 % with KCl 20 mEq / L  infusion  20 mEq Intravenous Continuous 03/12/21, MD 125 mL/hr at 03/10/21 0617 20 mEq at 03/10/21 0617   fluticasone (FLONASE) 50 MCG/ACT nasal spray 2 spray  2 spray Each Nare Daily Zierle-Ghosh, Asia B, DO   2 spray at 03/10/21 0916   heparin injection 5,000 Units  5,000 Units Subcutaneous Q8H Zierle-Ghosh, Asia B, DO   5,000 Units at 03/10/21 [phone]  ondansetron (ZOFRAN) tablet 4 mg  4 mg Oral Q6H PRN Zierle-Ghosh, Asia B, DO       Or   ondansetron (ZOFRAN) injection 4 mg  4 mg Intravenous Q6H PRN Zierle-Ghosh, Asia B, DO   4 mg at 03/08/21 1304   oxyCODONE (Oxy IR/ROXICODONE) immediate release tablet 5 mg  5 mg Oral Q4H PRN Zierle-Ghosh, Asia B, DO   5 mg at 03/09/21 0211     Discharge Medications: Please see discharge summary for a list of discharge medications.  Relevant Imaging Results:  Relevant Lab Results:   Additional Information SS# [id number]  Leitha Bleak, RN

## 2021-03-10 NOTE — Evaluation (Addendum)
Physical Therapy Evaluation Patient Details Name: Melissa West MRN: [id number] DOB: 1990/09/25 Today's Date: 03/10/2021  History of Present Illness  Melissa West  is a 30 y.o. female, with history of multiple sclerosis and asthma, presents the ED with a chief complaint of abdominal pain.  Patient is a very poor historian, and seems to have difficulty comprehending the questions.  It is unclear if this is her baseline, but with her BUN and creatinine being so elevated its entirely possible that she is altered.  Reports 2 days of abdominal pain.  She reports the pains are sharp and felt like she had to have a bowel movement.  Been progressively worse since it started.  She reports the pains were in her lower abdomen.  She has had urinary frequency, but only with small trickles voided.  She is not sure if she has had hematuria.  She is not sure when her last normal void was.  Her last normal bowel movement was 1-2 days ago.  Her last normal meal was 1-2 days ago.  She has had a decreased appetite because her abdomen has been so painful.  She denies any fevers.  She has had nausea and vomiting 1-2 times per day.  No hematemesis.  Patient has no other complaints at this time.     Of note patient does report her grandmother was on dialysis.  Patient does have MS, wheelchair-bound, according to patient she has intermittent voluntary movement of her lower extremities, but at the time of my exam it seems to be only movement is muscle spasms.   Clinical Impression  Patient presents in bed awake, alert, and agreeable for therapy, is a poor historian. Patient required physical assistance, verbal, and tactile cuing to transition from supine to sitting at EOB, demonstrates slow labored movement, unable to move B LE to EOB, unable to scoot to EOB once seated, and difficulty maintaining an upright sitting posture at EOB due to trunk weakness R>L. Patient unable to transfer from bed to chair w/ modified  independence, required Mod/Max assistance, demonstrated B LE extensor tone and clonus and a posterior lean, heart rate rose to 160 bpm during transfer and decreased quickly w/ rest -nurse notified. Patient tolerated sitting up in chair after therapy -nurse notified. Patient will benefit from continued skilled physical therapy in hospital and recommended venue below to increase strength, balance, endurance for safe ADLs and gait.         Recommendations for follow up therapy are one component of a multi-disciplinary discharge planning process, led by the attending physician.  Recommendations may be updated based on patient status, additional functional criteria and insurance authorization.  Follow Up Recommendations Skilled nursing-short term rehab (<3 hours/day)    Assistance Recommended at Discharge Frequent or constant Supervision/Assistance  Functional Status Assessment Patient has had a recent decline in their functional status and demonstrates the ability to make significant improvements in function in a reasonable and predictable amount of time.  Equipment Recommendations  None recommended by PT    Recommendations for Other Services       Precautions / Restrictions Precautions Precautions: Fall Restrictions Weight Bearing Restrictions: No      Mobility  Bed Mobility Overal bed mobility: Needs Assistance Bed Mobility: Supine to Sit     Supine to sit: Mod assist     General bed mobility comments: HOB flat. Demonstrated slow labored movement, required verbal and tactile cuing for positioning and use of B UE, required phyical assist to move B LE to  EOB, required physical assist at B shoulders, due to generalized weakness.    Transfers Overall transfer level: Needs assistance Equipment used: None Transfers: Sit to/from Stand;Bed to chair/wheelchair/BSC Sit to Stand: Mod assist;Max assist   Step pivot transfers: Mod assist;Max assist       General transfer comment:  Unable to perform transfer on her own, unable to scoot sideways at EOB, required physical assist to stand, demonstrated extensor tone and clonus in B LE    Ambulation/Gait                  Stairs            Wheelchair Mobility    Modified Rankin (Stroke Patients Only)       Balance Overall balance assessment: Needs assistance Sitting-balance support: Bilateral upper extremity supported;Feet supported Sitting balance-Leahy Scale: Fair Sitting balance - Comments: fair/poor seated at EOB (Demonstrated increased trunk weakness on R side)     Standing balance-Leahy Scale: Zero                               Pertinent Vitals/Pain Pain Assessment: No/denies pain    Home Living Family/patient expects to be discharged to:: Private residence Living Arrangements: Alone Available Help at Discharge: Other (Comment) (Grandmother has dialysis. Home health aide assists.) Type of Home: House Home Access: Ramped entrance       Home Layout: One level;Able to live on main level with bedroom/bathroom Home Equipment: Wheelchair - manual;BSC/3in1;Shower seat Additional Comments: Information per patient, poor historian. Demonstrated signs of possible confusion when answering questions.    Prior Function Prior Level of Function : Needs assist             Mobility Comments: Patient reports that her home health aide assists her to transfer from bed to wheelchair as needed. ADLs Comments: Assisted by home health aide.     Hand Dominance        Extremity/Trunk Assessment        Lower Extremity Assessment Lower Extremity Assessment: Generalized weakness;RLE deficits/detail;LLE deficits/detail RLE Deficits / Details: Extensor tone, clonus LLE Deficits / Details: Extensor tone, clonus       Communication   Communication: Expressive difficulties (possibly due to confusion)  Cognition Arousal/Alertness: Awake/alert Behavior During Therapy: WFL for tasks  assessed/performed Overall Cognitive Status: No family/caregiver present to determine baseline cognitive functioning                                          General Comments      Exercises     Assessment/Plan    PT Assessment Patient needs continued PT services  PT Problem List Decreased strength;Decreased mobility;Decreased safety awareness;Decreased range of motion;Decreased coordination;Decreased activity tolerance;Decreased balance;Decreased knowledge of use of DME       PT Treatment Interventions DME instruction;Therapeutic exercise;Balance training;Stair training;Functional mobility training;Therapeutic activities;Patient/family education    PT Goals (Current goals can be found in the Care Plan section)  Acute Rehab PT Goals Patient Stated Goal: Return home after SNF PT Goal Formulation: With patient Time For Goal Achievement: 03/24/21 Potential to Achieve Goals: Fair    Frequency Min 3X/week   Barriers to discharge        Co-evaluation               AM-PAC PT "6 Clicks" Mobility  Outcome Measure  Help needed turning from your back to your side while in a flat bed without using bedrails?: A Lot Help needed moving from lying on your back to sitting on the side of a flat bed without using bedrails?: A Lot Help needed moving to and from a bed to a chair (including a wheelchair)?: A Lot Help needed standing up from a chair using your arms (e.g., wheelchair or bedside chair)?: A Lot Help needed to walk in hospital room?: Total Help needed climbing 3-5 steps with a railing? : Total 6 Click Score: 10    End of Session   Activity Tolerance: Patient tolerated treatment well;No increased pain;Other (comment) (Increased heart rate to 160bpm w/ bed to chair transfer) Patient left: in chair;with call bell/phone within reach;with chair alarm set Nurse Communication: Mobility status PT Visit Diagnosis: Unsteadiness on feet (R26.81);Other abnormalities  of gait and mobility (R26.89);Muscle weakness (generalized) (M62.81)    Time: [id number] PT Time Calculation (min) (ACUTE ONLY): 33 min   Charges:   PT Evaluation $PT Eval Moderate Complexity: 1 Mod PT Treatments $Therapeutic Activity: 23-37 mins        Cassie Jones, SPT  During this treatment session, the therapist was present, participating in and directing the treatment.  3:13 PM, 03/10/21 Lonell Grandchild, MPT Physical Therapist with [facility] 336 [phone] office [phone] mobile phone

## 2021-03-10 NOTE — Plan of Care (Addendum)
  Problem: Acute Rehab PT Goals(only PT should resolve) Goal: Pt Will Go Supine/Side To Sit Outcome: Progressing Flowsheets (Taken 03/10/2021 1440) Pt will go Supine/Side to Sit: with minimal assist Goal: Patient Will Transfer Sit To/From Stand Outcome: Progressing Flowsheets (Taken 03/10/2021 1440) Patient will transfer sit to/from stand:  with moderate assist  with minimal assist Goal: Pt Will Transfer Bed To Chair/Chair To Bed Outcome: Progressing Flowsheets (Taken 03/10/2021 1440) Pt will Transfer Bed to Chair/Chair to Bed:  with min assist  with mod assist    Cassie Jones, SPT  During this treatment session, the therapist was present, participating in and directing the treatment.  3:14 PM, 03/10/21 Ocie Bob, MPT Physical Therapist with [facility] [phone] office [phone] mobile phone

## 2021-03-10 NOTE — TOC Initial Note (Signed)
Transition of Care [facility]) - Initial/Assessment Note    Patient Details  Name: Melissa West MRN: [id number] Date of Birth: 06/16/1990  Transition of Care [facility]) CM/SW Contact:    Leitha Bleak, RN Phone Number: 03/10/2021, 11:29 AM  Clinical Narrative:         Patient admitted with acute kidney injury. Patient lives with her mother and is wheelchair bound. Mother is her aide. PT is recommending SNF. Per mother patient is weak and not at baseline. She is agreeable and requested [facility]. FL2 sent and [facility] accepting. INS AUTH started.           Expected Discharge Plan: Skilled Nursing Facility Barriers to Discharge: Continued Medical Work up   Patient Goals and CMS Choice Patient states their goals for this hospitalization and ongoing recovery are:: Agreeable CMS Medicare.gov Compare Post Acute Care list provided to:: Patient Represenative (must comment) Choice offered to / list presented to : Parent  Expected Discharge Plan and Services Expected Discharge Plan: Skilled Nursing Facility       Living arrangements for the past 2 months: Single Family Home                                      Prior Living Arrangements/Services Living arrangements for the past 2 months: Single Family Home Lives with:: Parents Patient language and need for interpreter reviewed:: Yes Do you feel safe going back to the place where you live?: Yes      Need for Family Participation in Patient Care: Yes (Comment) Care giver support system in place?: Yes (comment) Current home services: DME Criminal Activity/Legal Involvement Pertinent to Current Situation/Hospitalization: No - Comment as needed  Activities of Daily Living Home Assistive Devices/Equipment: Wheelchair, Shower chair without back ADL Screening (condition at time of admission) Patient's cognitive ability adequate to safely complete daily activities?: Yes Is the patient deaf or have difficulty hearing?: No Does the  patient have difficulty seeing, even when wearing glasses/contacts?: No Does the patient have difficulty concentrating, remembering, or making decisions?: No Patient able to express need for assistance with ADLs?: Yes Does the patient have difficulty dressing or bathing?: Yes Independently performs ADLs?: No Communication: Independent Dressing (OT): Needs assistance Is this a change from baseline?: Pre-admission baseline Grooming: Needs assistance Is this a change from baseline?: Pre-admission baseline Feeding: Independent Bathing: Needs assistance Is this a change from baseline?: Pre-admission baseline Toileting: Needs assistance Is this a change from baseline?: Pre-admission baseline In/Out Bed: Needs assistance Is this a change from baseline?: Pre-admission baseline Walks in Home: Needs assistance Is this a change from baseline?: Pre-admission baseline Does the patient have difficulty walking or climbing stairs?: Yes Weakness of Legs: Both Weakness of Arms/Hands: Both  Permission Sought/Granted                  Emotional Assessment     Affect (typically observed): Accepting Orientation: : Oriented to Self, Oriented to Place, Oriented to  Time, Oriented to Situation Alcohol / Substance Use: Not Applicable Psych Involvement: No (comment)  Admission diagnosis:  Hyperkalemia [E87.5] Acute retention of urine [R33.8] AKI (acute kidney injury) (HCC) [N17.9] Acute renal failure, unspecified acute renal failure type [facility]) [N17.9] Patient Active Problem List   Diagnosis Date Noted   Hypernatremia 03/09/2021   Hyperkalemia 03/08/2021   Leukocytosis 03/08/2021   Mild protein-calorie malnutrition (HCC) 03/08/2021   Acute urinary retention 03/08/2021   Pressure injury of  skin 03/08/2021   AKI (acute kidney injury) (HCC) 03/07/2021   Rash 03/29/2019   Wheelchair bound 11/29/2018   Ataxia 11/29/2018   Nystagmus 11/29/2018   Urinary incontinence, urge 11/29/2018   MS  (multiple sclerosis) (HCC)    Neurological deficit present 11/11/2010   PCP:  Benita Stabile, MD Pharmacy:   [facility] [place], [place] - [address] [place] [address] Phone: [phone] Fax: [phone]     Social Determinants of Health (SDOH) Interventions    Readmission Risk Interventions Readmission Risk Prevention Plan 03/10/2021  Medication Screening Complete  Transportation Screening Complete  Some recent data might be hidden

## 2021-03-10 NOTE — Discharge Summary (Addendum)
Physician Discharge Summary  Melissa West [id number] DOB: 16-May-1990 DOA: 03/07/2021  PCP: Celene Squibb, MD  Admit date: 03/07/2021 Discharge date: 03/11/2021  Admitted From: Home Disposition: Skilled nursing facility  Recommendations for Outpatient Follow-up:  Follow up with PCP in 1-2 weeks Please obtain BMP/CBC in one week Continue Foley catheter on discharge Follow-up with urology, Dr. Alyson Ingles on 12/12 for voiding trial Referral has been made for her to follow-up with her primary neurologist to discuss further treatment of MS  Home Health: Equipment/Devices:  Discharge Condition: Stable CODE STATUS: Full code Diet recommendation: Regular diet  Brief/Interim Summary: 30 year old female with history of multiple sclerosis, presents to the hospital with complaints of abdominal pain.  Noted to have acute renal failure with a creatinine of 19 on admission.  Foley catheter placed and she expelled 3.5 L of urine.  Overall renal function has began to improve with decompression.  She was aggressively hydrated with IV fluids and overall renal function has normalized.  Discussed with urology with plans for outpatient follow-up and to continue Foley catheter until follow-up  Discharge Diagnoses:  Principal Problem:   AKI (acute kidney injury) ([place]) Active Problems:   MS (multiple sclerosis) ([place])   Hyperkalemia   Leukocytosis   Mild protein-calorie malnutrition ([place])   Acute urinary retention   Pressure injury of skin   Hypernatremia  Acute kidney injury -Baseline creatinine is normal -Admission creatinine noted to be 19.62 and BUN of 244 -CT abdomen showed mild bilateral hydronephrosis and hydroureter.  Images were taken after Foley catheter placement -Per ER records, once Foley catheter placed, she immediately expelled 3.5 L of urine -No mention of obstructing stones on imaging -Suspect that she may have neurogenic bladder related to multiple sclerosis.  She was also  on medications for overactive bladder prior to admission -We will need to continue with Foley catheter decompression -Creatinine is now down to 0.8   Hyperkalemia/hypokalemia -Related to renal failure -Treated medically, now resolved   Hypernatremia -Secondary to postobstructive diuresis and high volume urine output -Resolved with hydration with hypotonic fluids   Acute urinary retention -Resolved after placement of Foley catheter -Likely related to multiple sclerosis, she was also on medications for overactive bladder prior to admission -Discussed with urology, Dr. Alyson Ingles who recommended continued Foley catheter on discharge with outpatient follow-up with urology in the next 1 to 2 weeks -Urology referral has been made   Leukocytosis -Suspect this is acute phase reactant since is already improving -Urine culture with no growth   Asthma -Respiratory status stable -Continue as needed albuterol   Generalized weakness -Patient currently living at home -Mother reports that she usually uses a wheelchair but is able to transfer from bed to chair on her own -She has been increasingly weak lately -Seen by physical therapy with recommendations for skilled nursing facility placement   Multiple sclerosis -Last note from neurology noted to be in 07/2019 -It does not appear that she is on any active treatments at this time -Will need follow-up with her primary neurologist, Dr. Felecia Shelling to discuss this further.  Referral has been made. -At baseline, appears that she has bilateral leg weakness and spasticity.   Pressure injury, sacrum medial, stage II, present on admission Pressure Injury 03/08/21 Sacrum Medial Stage 2 -  Partial thickness loss of dermis presenting as a shallow open injury with a red, pink wound bed without slough. 1 by 1 stage 2 area (Active)  03/08/21 0913  Location: Sacrum  Location Orientation: Medial  Staging: Stage 2 -  Partial thickness loss of dermis presenting as a  shallow open injury with a red, pink wound bed without slough.  Wound Description (Comments): 1 by 1 stage 2 area  Present on Admission: Yes  Continue supportive care  Discharge Instructions  Discharge Instructions     Ambulatory referral to Neurology   Complete by: As directed    An appointment is requested in approximately: 2 to 3 weeks to follow-up for MS      Allergies as of 03/10/2021   No Known Allergies      Medication List     STOP taking these medications    amantadine 100 MG capsule Commonly known as: SYMMETREL   benzonatate 100 MG capsule Commonly known as: TESSALON   Gemtesa 75 MG Tabs Generic drug: Vibegron   modafinil 200 MG tablet Commonly known as: Provigil   oseltamivir 75 MG capsule Commonly known as: TAMIFLU   solifenacin 10 MG tablet Commonly known as: VESIcare   triamcinolone 0.025 % cream Commonly known as: KENALOG   Zeposia 0.92 MG Caps Generic drug: Ozanimod HCl       TAKE these medications    albuterol 108 (90 Base) MCG/ACT inhaler Commonly known as: VENTOLIN HFA Inhale 1-2 puffs into the lungs every 6 (six) hours as needed for wheezing or shortness of breath.   fluticasone 50 MCG/ACT nasal spray Commonly known as: FLONASE Place 2 sprays into both nostrils daily.   ondansetron 4 MG disintegrating tablet Commonly known as: Zofran ODT Take 1 tablet (4 mg total) by mouth every 8 (eight) hours as needed for nausea or vomiting.   Vitamin D (Ergocalciferol) 1.25 MG (50000 UNIT) Caps capsule Commonly known as: DRISDOL TAKE 1 CAPSULE BY MOUTH WEEKLY.        Follow-up Information     [name], Candee Furbish, MD Follow up on 03/23/2021.   Specialty: Urology Why: 1:15 PM Contact information: [address] [place] [address] [phone]                No Known Allergies  Consultations:    Procedures/Studies: CT ABDOMEN PELVIS WO CONTRAST  Result Date: 03/07/2021 CLINICAL DATA:  Abdominal pain.  EXAM: CT ABDOMEN AND PELVIS WITHOUT CONTRAST TECHNIQUE: Multidetector CT imaging of the abdomen and pelvis was performed following the standard protocol without IV contrast. COMPARISON:  None. FINDINGS: Lower chest: No acute abnormality. Hepatobiliary: No focal liver abnormality is seen. Subcentimeter gallstones are seen within an otherwise normal appearing gallbladder. Pancreas: Unremarkable. No pancreatic ductal dilatation or surrounding inflammatory changes. Spleen: Normal in size without focal abnormality. Adrenals/Urinary Tract: Adrenal glands are unremarkable. Both kidneys are enlarged, without focal lesions. Mild bilateral hydronephrosis and hydroureter are present. A Foley catheter is seen within the urinary bladder. Stomach/Bowel: Stomach is within normal limits. The appendix is not clearly identified. No evidence of bowel wall thickening, distention, or inflammatory changes. Vascular/Lymphatic: No significant vascular findings are present. No enlarged abdominal or pelvic lymph nodes. Reproductive: Uterus and bilateral adnexa are unremarkable. Other: No abdominal wall hernia or abnormality. Moderate severity, diffuse presacral inflammatory fat stranding is noted. No abdominopelvic ascites. Musculoskeletal: No acute or significant osseous findings. IMPRESSION: 1. Mild bilateral hydronephrosis and hydroureter, which may be secondary to the presence of a Foley catheter within the urinary bladder. 2. Cholelithiasis. Electronically Signed   By: Virgina Norfolk M.D.   On: 03/07/2021 22:46   DG Chest Portable 1 View  Result Date: 02/23/2021 CLINICAL DATA:  Fever, cough EXAM: PORTABLE CHEST 1 VIEW COMPARISON:  None. FINDINGS: The heart size and mediastinal contours are within normal limits. Both lungs are clear. The visualized skeletal structures are unremarkable. IMPRESSION: No active disease. Electronically Signed   By: Rolm Baptise M.D.   On: 02/23/2021 18:38      Subjective: Feeling better.  Does  not have any further complaints.  Discharge Exam: Vitals:   03/10/21 0538 03/10/21 0720 03/10/21 0914 03/10/21 1430  BP: (!) 90/57  98/88 103/68  Pulse: 82  88 97  Resp: 16   18  Temp: 99 F (37.2 C)  98.1 F (36.7 C) 98.7 F (37.1 C)  TempSrc: Oral  Oral Oral  SpO2: 100%  100% 100%  Weight:  64.5 kg    Height:        General: Pt is alert, awake, not in acute distress Cardiovascular: RRR, S1/S2 +, no rubs, no gallops Respiratory: CTA bilaterally, no wheezing, no rhonchi Abdominal: Soft, NT, ND, bowel sounds + Extremities: no edema, no cyanosis    The results of significant diagnostics from this hospitalization (including imaging, microbiology, ancillary and laboratory) are listed below for reference.     Microbiology: Recent Results (from the past 240 hour(s))  Urine Culture     Status: None   Collection Time: 03/07/21 10:00 PM   Specimen: Urine, Catheterized  Result Value Ref Range Status   Specimen Description   Final    URINE, CATHETERIZED Performed at [facility], [address] [place], [place] [address]    Special Requests   Final    NONE Performed at [facility], [address], [place], [place] [address]    Culture   Final    NO GROWTH Performed at [facility], [place] [address] [place], [place] [address]    Report Status 03/09/2021 FINAL  Final  Resp Panel by RT-PCR (Flu A&B, Covid) Nasopharyngeal Swab     Status: None   Collection Time: 03/07/21 10:10 PM   Specimen: Nasopharyngeal Swab; Nasopharyngeal(NP) swabs in vial transport medium  Result Value Ref Range Status   SARS Coronavirus 2 by RT PCR NEGATIVE NEGATIVE Final    Comment: (NOTE) SARS-CoV-2 target nucleic acids are NOT DETECTED.  The SARS-CoV-2 RNA is generally detectable in upper respiratory specimens during the acute phase of infection. The lowest concentration of SARS-CoV-2 viral copies this assay can detect is 138 copies/mL. A negative result does not preclude  SARS-Cov-2 infection and should not be used as the sole basis for treatment or other patient management decisions. A negative result may occur with  improper specimen collection/handling, submission of specimen other than nasopharyngeal swab, presence of viral mutation(s) within the areas targeted by this assay, and inadequate number of viral copies(<138 copies/mL). A negative result must be combined with clinical observations, patient history, and epidemiological information. The expected result is Negative.  Fact Sheet for Patients:  EntrepreneurPulse.com.au  Fact Sheet for Healthcare Providers:  IncredibleEmployment.be  This test is no t yet approved or cleared by the [place] FDA and  has been authorized for detection and/or diagnosis of SARS-CoV-2 by FDA under an Emergency Use Authorization (EUA). This EUA will remain  in effect (meaning this test can be used) for the duration of the COVID-19 declaration under Section 564(b)(1) of the Act, 21 U.S.C.section 360bbb-3(b)(1), unless the authorization is terminated  or revoked sooner.       Influenza A by PCR NEGATIVE NEGATIVE Final   Influenza B by PCR NEGATIVE NEGATIVE Final    Comment: (NOTE) The Xpert Xpress SARS-CoV-2/FLU/RSV  plus assay is intended as an aid in the diagnosis of influenza from Nasopharyngeal swab specimens and should not be used as a sole basis for treatment. Nasal washings and aspirates are unacceptable for Xpert Xpress SARS-CoV-2/FLU/RSV testing.  Fact Sheet for Patients: EntrepreneurPulse.com.au  Fact Sheet for Healthcare Providers: IncredibleEmployment.be  This test is not yet approved or cleared by the [place] FDA and has been authorized for detection and/or diagnosis of SARS-CoV-2 by FDA under an Emergency Use Authorization (EUA). This EUA will remain in effect (meaning this test can be used) for the duration of  the COVID-19 declaration under Section 564(b)(1) of the Act, 21 U.S.C. section 360bbb-3(b)(1), unless the authorization is terminated or revoked.  Performed at [facility], [address] [place], [place] [address]      Labs: BNP (last 3 results) No results for input(s): BNP in the last 8760 hours. Basic Metabolic Panel: Recent Labs  Lab 03/08/21 0543 03/08/21 0806 03/08/21 1735 03/09/21 0402 03/09/21 1637 03/10/21 0546  NA 142 142 147* 153* 144 140  K 3.6 3.1* 2.9* 3.3* 3.8 3.8  CL 103 106 113* 111 109 106  CO2 16* 16* 24 33* 28 29  GLUCOSE 150* 144* 186* 114* 109* 115*  BUN 194* 168* 112* 72* 32* 19  CREATININE 9.08* 6.89* 2.79* 1.50* 0.83 0.80  CALCIUM 9.7 8.6* 9.0 8.9 8.3* 8.1*  MG 3.0*  --  2.6*  --   --   --   PHOS 8.3*  --   --  2.8  --   --    Liver Function Tests: Recent Labs  Lab 03/07/21 2000 03/07/21 2059 03/08/21 0543 03/09/21 0402  AST 18 17 16   --   ALT 20 17 18   --   ALKPHOS 89 77 84  --   BILITOT 0.5 0.7 0.9  --   PROT 8.8* 7.6 8.7*  --   ALBUMIN 3.5 3.0* 3.4* 2.7*   Recent Labs  Lab 03/07/21 2000  LIPASE 90*   No results for input(s): AMMONIA in the last 168 hours. CBC: Recent Labs  Lab 03/07/21 2000 03/08/21 0543 03/08/21 0806  WBC 14.2* 13.9* 10.8*  HGB 11.5* 11.8* 11.4*  HCT 32.9* 34.3* 33.2*  MCV 86.4 85.5 87.6  PLT 243 247 238   Cardiac Enzymes: No results for input(s): CKTOTAL, CKMB, CKMBINDEX, TROPONINI in the last 168 hours. BNP: Invalid input(s): POCBNP CBG: No results for input(s): GLUCAP in the last 168 hours. D-Dimer No results for input(s): DDIMER in the last 72 hours. Hgb A1c No results for input(s): HGBA1C in the last 72 hours. Lipid Profile No results for input(s): CHOL, HDL, LDLCALC, TRIG, CHOLHDL, LDLDIRECT in the last 72 hours. Thyroid function studies No results for input(s): TSH, T4TOTAL, T3FREE, THYROIDAB in the last 72 hours.  Invalid input(s): FREET3 Anemia work up No results for input(s):  VITAMINB12, FOLATE, FERRITIN, TIBC, IRON, RETICCTPCT in the last 72 hours. Urinalysis    Component Value Date/Time   COLORURINE YELLOW 03/07/2021 2200   APPEARANCEUR CLEAR 03/07/2021 2200   LABSPEC 1.015 03/07/2021 2200   PHURINE 5.0 03/07/2021 2200   GLUCOSEU NEGATIVE 03/07/2021 2200   HGBUR MODERATE (A) 03/07/2021 2200   BILIRUBINUR NEGATIVE 03/07/2021 2200   KETONESUR NEGATIVE 03/07/2021 2200   PROTEINUR NEGATIVE 03/07/2021 2200   UROBILINOGEN 0.2 06/13/2009 1128   NITRITE NEGATIVE 03/07/2021 2200   LEUKOCYTESUR NEGATIVE 03/07/2021 2200   Sepsis Labs Invalid input(s): PROCALCITONIN,  WBC,  LACTICIDVEN Microbiology Recent Results (from the past 240 hour(s))  Urine Culture     Status: None   Collection Time: 03/07/21 10:00 PM   Specimen: Urine, Catheterized  Result Value Ref Range Status   Specimen Description   Final    URINE, CATHETERIZED Performed at [facility], [address] [place], [place] [address]    Special Requests   Final    NONE Performed at [facility], [address], [place], [place] [address]    Culture   Final    NO GROWTH Performed at [facility], [address], [place], [place] [address]    Report Status 03/09/2021 FINAL  Final  Resp Panel by RT-PCR (Flu A&B, Covid) Nasopharyngeal Swab     Status: None   Collection Time: 03/07/21 10:10 PM   Specimen: Nasopharyngeal Swab; Nasopharyngeal(NP) swabs in vial transport medium  Result Value Ref Range Status   SARS Coronavirus 2 by RT PCR NEGATIVE NEGATIVE Final    Comment: (NOTE) SARS-CoV-2 target nucleic acids are NOT DETECTED.  The SARS-CoV-2 RNA is generally detectable in upper respiratory specimens during the acute phase of infection. The lowest concentration of SARS-CoV-2 viral copies this assay can detect is 138 copies/mL. A negative result does not preclude SARS-Cov-2 infection and should not be used as the sole basis for treatment or other patient management decisions. A negative  result may occur with  improper specimen collection/handling, submission of specimen other than nasopharyngeal swab, presence of viral mutation(s) within the areas targeted by this assay, and inadequate number of viral copies(<138 copies/mL). A negative result must be combined with clinical observations, patient history, and epidemiological information. The expected result is Negative.  Fact Sheet for Patients:  BloggerCourse.com  Fact Sheet for Healthcare Providers:  SeriousBroker.it  This test is no t yet approved or cleared by the [place] FDA and  has been authorized for detection and/or diagnosis of SARS-CoV-2 by FDA under an Emergency Use Authorization (EUA). This EUA will remain  in effect (meaning this test can be used) for the duration of the COVID-19 declaration under Section 564(b)(1) of the Act, 21 U.S.C.section 360bbb-3(b)(1), unless the authorization is terminated  or revoked sooner.       Influenza A by PCR NEGATIVE NEGATIVE Final   Influenza B by PCR NEGATIVE NEGATIVE Final    Comment: (NOTE) The Xpert Xpress SARS-CoV-2/FLU/RSV plus assay is intended as an aid in the diagnosis of influenza from Nasopharyngeal swab specimens and should not be used as a sole basis for treatment. Nasal washings and aspirates are unacceptable for Xpert Xpress SARS-CoV-2/FLU/RSV testing.  Fact Sheet for Patients: BloggerCourse.com  Fact Sheet for Healthcare Providers: SeriousBroker.it  This test is not yet approved or cleared by the [place] FDA and has been authorized for detection and/or diagnosis of SARS-CoV-2 by FDA under an Emergency Use Authorization (EUA). This EUA will remain in effect (meaning this test can be used) for the duration of the COVID-19 declaration under Section 564(b)(1) of the Act, 21 U.S.C. section 360bbb-3(b)(1), unless the authorization is  terminated or revoked.  Performed at [facility], [address], [place], [place] [address]      Time coordinating discharge:  SIGNED:   Erick Blinks, MD  [facility] 03/10/2021, 5:35 PM   If 7PM-7AM, please contact night-coverage [email or web address]

## 2021-03-11 DIAGNOSIS — M6281 Muscle weakness (generalized): Secondary | ICD-10-CM | POA: Diagnosis not present

## 2021-03-11 DIAGNOSIS — N179 Acute kidney failure, unspecified: Secondary | ICD-10-CM | POA: Diagnosis not present

## 2021-03-11 DIAGNOSIS — G35 Multiple sclerosis: Secondary | ICD-10-CM | POA: Diagnosis not present

## 2021-03-11 DIAGNOSIS — Z7401 Bed confinement status: Secondary | ICD-10-CM | POA: Diagnosis not present

## 2021-03-11 DIAGNOSIS — E875 Hyperkalemia: Secondary | ICD-10-CM | POA: Diagnosis not present

## 2021-03-11 DIAGNOSIS — E870 Hyperosmolality and hypernatremia: Secondary | ICD-10-CM | POA: Diagnosis not present

## 2021-03-11 DIAGNOSIS — R339 Retention of urine, unspecified: Secondary | ICD-10-CM | POA: Diagnosis not present

## 2021-03-11 DIAGNOSIS — R338 Other retention of urine: Secondary | ICD-10-CM | POA: Diagnosis not present

## 2021-03-11 DIAGNOSIS — R279 Unspecified lack of coordination: Secondary | ICD-10-CM | POA: Diagnosis not present

## 2021-03-11 DIAGNOSIS — R293 Abnormal posture: Secondary | ICD-10-CM | POA: Diagnosis not present

## 2021-03-11 DIAGNOSIS — R531 Weakness: Secondary | ICD-10-CM | POA: Diagnosis not present

## 2021-03-11 NOTE — Evaluation (Signed)
Occupational Therapy Evaluation Patient Details Name: Melissa West MRN: [id number] DOB: 1991/02/04 Today's Date: 03/11/2021   History of Present Illness Melissa West  is a 30 y.o. female, with history of multiple sclerosis and asthma, presents the ED with a chief complaint of abdominal pain.  Patient is a very poor historian, and seems to have difficulty comprehending the questions.  It is unclear if this is her baseline, but with her BUN and creatinine being so elevated its entirely possible that she is altered.  Reports 2 days of abdominal pain.  She reports the pains are sharp and felt like she had to have a bowel movement.  Been progressively worse since it started.  She reports the pains were in her lower abdomen.  She has had urinary frequency, but only with small trickles voided.  She is not sure if she has had hematuria.  She is not sure when her last normal void was.  Her last normal bowel movement was 1-2 days ago.  Her last normal meal was 1-2 days ago.  She has had a decreased appetite because her abdomen has been so painful.  She denies any fevers.  She has had nausea and vomiting 1-2 times per day.  No hematemesis.  Patient has no other complaints at this time.     Of note patient does report her grandmother was on dialysis.  Patient does have MS, wheelchair-bound, according to patient she has intermittent voluntary movement of her lower extremities, but at the time of my exam it seems to be only movement is muscle spasms.   Clinical Impression   Pt agreeable to OT evaluation. Pt presents with good B UE strength with poor gross and fine motor coordination. This date pt demonstrates deficits in trunk control needing to stabilize using B UE support on knees while seated at EOB. Pt demonstrates significant extensor tone make sit to stand difficult requiring max A. Pt left in bed with call bell within reach. Pt will benefit from continued OT in the hospital and recommended venue  below to increase strength, balance, and endurance for safe ADL's.        Recommendations for follow up therapy are one component of a multi-disciplinary discharge planning process, led by the attending physician.  Recommendations may be updated based on patient status, additional functional criteria and insurance authorization.   Follow Up Recommendations  Skilled nursing-short term rehab (<3 hours/day)    Assistance Recommended at Discharge Intermittent Supervision/Assistance  Functional Status Assessment  Patient has had a recent decline in their functional status and demonstrates the ability to make significant improvements in function in a reasonable and predictable amount of time.  Equipment Recommendations  None recommended by OT    Recommendations for Other Services       Precautions / Restrictions Precautions Precautions: Fall Restrictions Weight Bearing Restrictions: No      Mobility Bed Mobility Overal bed mobility: Needs Assistance Bed Mobility: Supine to Sit;Sit to Supine     Supine to sit: Mod assist Sit to supine: Max assist   General bed mobility comments: HOB mostly flat with need for verbal cuing and assist to move B LE. Pt required max A for sit to supine due to clonus.    Transfers Overall transfer level: Needs assistance Equipment used: Rolling walker (2 wheels) Transfers: Sit to/from Stand Sit to Stand: Max assist           General transfer comment: Pt demonstrated significant extensor tone when standing leading to pt being  lowerd to bed and assisted to supine position in bed.      Balance Overall balance assessment: Needs assistance Sitting-balance support: Bilateral upper extremity supported;Feet supported Sitting balance-Leahy Scale: Fair Sitting balance - Comments: fair/poor seated at EOB   Standing balance support: Bilateral upper extremity supported;During functional activity;Reliant on assistive device for balance Standing  balance-Leahy Scale: Poor Standing balance comment: using RW                           ADL either performed or assessed with clinical judgement   ADL Overall ADL's : Needs assistance/impaired                                     Functional mobility during ADLs: Maximal assistance;Rolling walker (2 wheels) (Max A needed for sit to stand with RW.)       Vision Baseline Vision/History: 1 Wears glasses Ability to See in Adequate Light: 1 Impaired Patient Visual Report: No change from baseline Vision Assessment?: No apparent visual deficits (Other than baseline.) Additional Comments: R eye exotropia     Perception     Praxis Praxis Praxis-Other Comments: Limited by clonus at times.    Pertinent Vitals/Pain Pain Assessment: No/denies pain     Hand Dominance Left   Extremity/Trunk Assessment Upper Extremity Assessment Upper Extremity Assessment:  (Decreased FM and gross motor coordiantion likely due to MS at baseline, but good B UE strength.)   Lower Extremity Assessment Lower Extremity Assessment: Defer to PT evaluation   Cervical / Trunk Assessment Cervical / Trunk Assessment: Normal   Communication Communication Communication: No difficulties   Cognition Arousal/Alertness: Awake/alert Behavior During Therapy: WFL for tasks assessed/performed Overall Cognitive Status: Within Functional Limits for tasks assessed                                 General Comments: oriented to place and month.                      Home Living Family/patient expects to be discharged to:: Private residence Living Arrangements: Alone Available Help at Discharge: Other (Comment) Type of Home: House Home Access: Ramped entrance     Home Layout: One level;Able to live on main level with bedroom/bathroom         Bathroom Toilet: Standard Bathroom Accessibility: No   Home Equipment: Wheelchair - manual;BSC/3in1;Shower seat   Additional  Comments: Taken from pt and previous documentation.      Prior Functioning/Environment Prior Level of Function : Needs assist             Mobility Comments: Patient reports that her home health aide assists her to transfer from bed to wheelchair as needed. ADLs Comments: Assisted by home health aide.        OT Problem List: Impaired balance (sitting and/or standing);Decreased coordination;Decreased cognition      OT Treatment/Interventions: Self-care/ADL training;Therapeutic exercise;Therapeutic activities;Patient/family education;Balance training;Cognitive remediation/compensation;DME and/or AE instruction;Neuromuscular education    OT Goals(Current goals can be found in the care plan section) Acute Rehab OT Goals Patient Stated Goal: Pt open to rehab prior to return home. OT Goal Formulation: With patient Time For Goal Achievement: 03/25/21 Potential to Achieve Goals: Fair  OT Frequency: Min 2X/week  End of Session Equipment Utilized During Treatment: Rolling walker (2 wheels) Nurse Communication: Other (comment) (Notified pt was in bed.)  Activity Tolerance: Patient tolerated treatment well Patient left: in bed;with call bell/phone within reach  OT Visit Diagnosis: Unsteadiness on feet (R26.81);Ataxia, unspecified (R27.0)                Time: [id number] OT Time Calculation (min): 21 min Charges:  OT General Charges $OT Visit: 1 Visit OT Evaluation $OT Eval Moderate Complexity: 1 Mod  Quay Simkin OT, MOT  Larey Seat 03/11/2021, 9:55 AM

## 2021-03-11 NOTE — Progress Notes (Signed)
Physical Therapy Treatment Patient Details Name: Keba Floresca MRN: [id number] DOB: [birth year] Today's Date: 03/11/2021   History of Present Illness Melissa West  is a 30 y.o. female, with history of multiple sclerosis and asthma, presents the ED with a chief complaint of abdominal pain.  Patient is a very poor historian, and seems to have difficulty comprehending the questions.  It is unclear if this is her baseline, but with her BUN and creatinine being so elevated its entirely possible that she is altered.  Reports 2 days of abdominal pain.  She reports the pains are sharp and felt like she had to have a bowel movement.  Been progressively worse since it started.  She reports the pains were in her lower abdomen.  She has had urinary frequency, but only with small trickles voided.  She is not sure if she has had hematuria.  She is not sure when her last normal void was.  Her last normal bowel movement was 1-2 days ago.  Her last normal meal was 1-2 days ago.  She has had a decreased appetite because her abdomen has been so painful.  She denies any fevers.  She has had nausea and vomiting 1-2 times per day.  No hematemesis.  Patient has no other complaints at this time.     Of note patient does report her grandmother was on dialysis.  Patient does have MS, wheelchair-bound, according to patient she has intermittent voluntary movement of her lower extremities, but at the time of my exam it seems to be only movement is muscle spasms.    PT Comments    Patient presents seated in chair (assisted by nursing staff) and agreeable for therapy.  Patient tolerated active assisted ankle pumps, BLE extension/flexion  to BLE  with frequent episodes of clonus at ankles requiring ankles held in end range dorsiflexion for a few seconds to resolve, able to complete stand pivot transfer to bedside, but leans backwards with uncontrollable BLE hip/knee extension requiring Max assist to avoid sliding off side of  bed.  Patient has to use BUE for support to keep trunk in midline while seated in chair without back supported and required Mod/max assist to scoot bottom forward/backwards while seated in chair.  Patient tolerated staying up in chair after therapy.  Patient will benefit from continued skilled physical therapy in hospital and recommended venue below to increase strength, balance, endurance for safe ADLs and gait.    Recommendations for follow up therapy are one component of a multi-disciplinary discharge planning process, led by the attending physician.  Recommendations may be updated based on patient status, additional functional criteria and insurance authorization.  Follow Up Recommendations  Skilled nursing-short term rehab (<3 hours/day)     Assistance Recommended at Discharge Intermittent Supervision/Assistance  Equipment Recommendations  None recommended by PT    Recommendations for Other Services       Precautions / Restrictions Precautions Precautions: Fall Restrictions Weight Bearing Restrictions: No     Mobility  Bed Mobility               General bed mobility comments: Presents seated in chair (assisted by nursing staff)    Transfers Overall transfer level: Needs assistance Equipment used: 1 person hand held assist Transfers: Sit to/from Stand;Bed to chair/wheelchair/BSC Sit to Stand: Mod assist;Max assist Stand pivot transfers: Max assist         General transfer comment: unsteady on feet with tendency to extend trunk and legs resulting in loss of balance  Ambulation/Gait                   Stairs             Wheelchair Mobility    Modified Rankin (Stroke Patients Only)       Balance Overall balance assessment: Needs assistance Sitting-balance support: Feet supported;No upper extremity supported Sitting balance-Leahy Scale: Poor Sitting balance - Comments: fair/poor seated at EOB   Standing balance support: During functional  activity;No upper extremity supported Standing balance-Leahy Scale: Poor Standing balance comment: hand held assist                            Cognition Arousal/Alertness: Awake/alert Behavior During Therapy: WFL for tasks assessed/performed Overall Cognitive Status: Within Functional Limits for tasks assessed                                          Exercises General Exercises - Lower Extremity Ankle Circles/Pumps: Seated;AAROM;Strengthening;15 reps Heel Slides: Seated;AAROM;Strengthening;15 reps    General Comments        Pertinent Vitals/Pain Pain Assessment: No/denies pain    Home Living                          Prior Function            PT Goals (current goals can now be found in the care plan section) Acute Rehab PT Goals Patient Stated Goal: Return home after SNF PT Goal Formulation: With patient Time For Goal Achievement: 03/24/21 Potential to Achieve Goals: Good Progress towards PT goals: Progressing toward goals    Frequency    Min 3X/week      PT Plan Current plan remains appropriate    Co-evaluation              AM-PAC PT "6 Clicks" Mobility   Outcome Measure  Help needed turning from your back to your side while in a flat bed without using bedrails?: A Lot Help needed moving from lying on your back to sitting on the side of a flat bed without using bedrails?: A Lot Help needed moving to and from a bed to a chair (including a wheelchair)?: A Lot Help needed standing up from a chair using your arms (e.g., wheelchair or bedside chair)?: A Lot Help needed to walk in hospital room?: Total Help needed climbing 3-5 steps with a railing? : Total 6 Click Score: 10    End of Session   Activity Tolerance: Patient tolerated treatment well;Patient limited by fatigue Patient left: in chair Nurse Communication: Mobility status PT Visit Diagnosis: Unsteadiness on feet (R26.81);Other abnormalities of gait and  mobility (R26.89);Muscle weakness (generalized) (M62.81)     Time: 0865-7846 PT Time Calculation (min) (ACUTE ONLY): 27 min  Charges:  $Therapeutic Exercise: 8-22 mins $Therapeutic Activity: 8-22 mins                     3:22 PM, 03/11/21 Ocie Bob, MPT Physical Therapist with [facility] [phone] office [phone] mobile phone

## 2021-03-11 NOTE — Progress Notes (Signed)
Called report to Loa Socks RN awaitingfor EMS to arrive to pick up patient.

## 2021-03-11 NOTE — Discharge Summary (Signed)
Physician Discharge Summary  Melissa West [id number] DOB: [birth year] DOA: 03/07/2021  PCP: Celene Squibb, MD  Admit date: 03/07/2021 Discharge date: 03/11/2021  Admitted From: Home Disposition:  SNF  Recommendations for Outpatient Follow-up:  Follow up with PCP in 1-2 weeks Please obtain BMP/CBC in one week Continue foley catheter until she follows up with urology Follow up with Dr. Alyson Ingles on 12/12 for voiding trial Referral has been made for follow up with her primary neurologist to discuss further treatment of MS   Discharge Condition: Stable CODE STATUS:FULL Diet recommendation: Regular   Brief/Interim Summary: 30 year old female with history of multiple sclerosis, presents to the hospital with complaints of abdominal pain.  Noted to have acute renal failure with a creatinine of 19 on admission.  Foley catheter placed and she expelled 3.5 L of urine.  Overall renal function has began to improve with decompression.  She was aggressively hydrated with IV fluids and overall renal function has normalized.  Discussed with urology with plans for outpatient follow-up and to continue Foley catheter until follow-up  Discharge Diagnoses:  Acute kidney injury -Baseline creatinine is 0.8-0.9 -Admission creatinine noted to be 19.62 and BUN of 244 -CT abdomen showed mild bilateral hydronephrosis and hydroureter.  Images were taken after Foley catheter placement -Per ER records, once Foley catheter placed, she immediately expelled 3.5 L of urine -No mention of obstructing stones on imaging -Suspect that she may have neurogenic bladder related to multiple sclerosis.  She was also on medications for overactive bladder prior to admission -We will need to continue with Foley catheter decompression -Creatinine is now down to 0.8   Hyperkalemia/hypokalemia -Related to renal failure -Treated medically, now resolved   Hypernatremia -Secondary to postobstructive diuresis and high  volume urine output -Resolved with hydration with hypotonic fluids   Acute urinary retention -Resolved after placement of Foley catheter -Likely related to multiple sclerosis, she was also on medications for overactive bladder prior to admission -Discussed with urology, Dr. Alyson Ingles who recommended continued Foley catheter on discharge with outpatient follow-up with urology in the next 1 to 2 weeks -Urology referral has been made   Leukocytosis -Suspect this is acute phase reactant since is already improving -Urine culture with no growth   Asthma -Respiratory status stable -Continue as needed albuterol -stable on RA   Generalized weakness -Patient currently living at home before hospitalization -Mother reports that she usually uses a wheelchair but is able to transfer from bed to chair on her own -She has been increasingly weak lately -Seen by physical therapy with recommendations for skilled nursing facility placement   Multiple sclerosis -Last note from neurology noted to be in 07/2019 -It does not appear that she is on any active treatments at this time -Will need follow-up with her primary neurologist, Dr. Felecia Shelling to discuss this further.  Referral has been made. -At baseline, appears that she has bilateral leg weakness and spasticity.   Pressure injury, sacrum medial, stage II, present on admission Pressure Injury 03/08/21 Sacrum Medial Stage 2 -  Partial thickness loss of dermis presenting as a shallow open injury with a red, pink wound bed without slough. 1 by 1 stage 2 area (Active)  03/08/21 0913  Location: Sacrum  Location Orientation: Medial  Staging: Stage 2 -  Partial thickness loss of dermis presenting as a shallow open injury with a red, pink wound bed without slough.  Wound Description (Comments): 1 by 1 stage 2 area  Present on Admission: Yes  Continue supportive care  Discharge Instructions  Discharge Instructions     Ambulatory referral to Neurology    Complete by: As directed    An appointment is requested in approximately: 2 to 3 weeks to follow-up for MS      Allergies as of 03/11/2021   No Known Allergies      Medication List     STOP taking these medications    amantadine 100 MG capsule Commonly known as: SYMMETREL   benzonatate 100 MG capsule Commonly known as: TESSALON   Gemtesa 75 MG Tabs Generic drug: Vibegron   modafinil 200 MG tablet Commonly known as: Provigil   oseltamivir 75 MG capsule Commonly known as: TAMIFLU   solifenacin 10 MG tablet Commonly known as: VESIcare   triamcinolone 0.025 % cream Commonly known as: KENALOG   Zeposia 0.92 MG Caps Generic drug: Ozanimod HCl       TAKE these medications    albuterol 108 (90 Base) MCG/ACT inhaler Commonly known as: VENTOLIN HFA Inhale 1-2 puffs into the lungs every 6 (six) hours as needed for wheezing or shortness of breath.   fluticasone 50 MCG/ACT nasal spray Commonly known as: FLONASE Place 2 sprays into both nostrils daily.   ondansetron 4 MG disintegrating tablet Commonly known as: Zofran ODT Take 1 tablet (4 mg total) by mouth every 8 (eight) hours as needed for nausea or vomiting.   Vitamin D (Ergocalciferol) 1.25 MG (50000 UNIT) Caps capsule Commonly known as: DRISDOL TAKE 1 CAPSULE BY MOUTH WEEKLY.        Follow-up Information     [name], Candee Furbish, MD Follow up on 03/23/2021.   Specialty: Urology Why: 1:15 PM Contact information: [address] [place] [address] [phone]                No Known Allergies  Consultations: none   Procedures/Studies: CT ABDOMEN PELVIS WO CONTRAST  Result Date: 03/07/2021 CLINICAL DATA:  Abdominal pain. EXAM: CT ABDOMEN AND PELVIS WITHOUT CONTRAST TECHNIQUE: Multidetector CT imaging of the abdomen and pelvis was performed following the standard protocol without IV contrast. COMPARISON:  None. FINDINGS: Lower chest: No acute abnormality. Hepatobiliary: No focal  liver abnormality is seen. Subcentimeter gallstones are seen within an otherwise normal appearing gallbladder. Pancreas: Unremarkable. No pancreatic ductal dilatation or surrounding inflammatory changes. Spleen: Normal in size without focal abnormality. Adrenals/Urinary Tract: Adrenal glands are unremarkable. Both kidneys are enlarged, without focal lesions. Mild bilateral hydronephrosis and hydroureter are present. A Foley catheter is seen within the urinary bladder. Stomach/Bowel: Stomach is within normal limits. The appendix is not clearly identified. No evidence of bowel wall thickening, distention, or inflammatory changes. Vascular/Lymphatic: No significant vascular findings are present. No enlarged abdominal or pelvic lymph nodes. Reproductive: Uterus and bilateral adnexa are unremarkable. Other: No abdominal wall hernia or abnormality. Moderate severity, diffuse presacral inflammatory fat stranding is noted. No abdominopelvic ascites. Musculoskeletal: No acute or significant osseous findings. IMPRESSION: 1. Mild bilateral hydronephrosis and hydroureter, which may be secondary to the presence of a Foley catheter within the urinary bladder. 2. Cholelithiasis. Electronically Signed   By: Virgina Norfolk M.D.   On: 03/07/2021 22:46   DG Chest Portable 1 View  Result Date: 02/23/2021 CLINICAL DATA:  Fever, cough EXAM: PORTABLE CHEST 1 VIEW COMPARISON:  None. FINDINGS: The heart size and mediastinal contours are within normal limits. Both lungs are clear. The visualized skeletal structures are unremarkable. IMPRESSION: No active disease. Electronically Signed   By: Rolm Baptise M.D.   On:  02/23/2021 18:38        Discharge Exam: Vitals:   03/11/21 0441 03/11/21 0822  BP: 107/80 98/76  Pulse: 98 83  Resp: 20   Temp: 98.6 F (37 C)   SpO2: 100%    Vitals:   03/10/21 2106 03/11/21 0441 03/11/21 0443 03/11/21 0822  BP: 121/81 107/80  98/76  Pulse: (!) 106 98  83  Resp: 18 20    Temp: 98 F  (36.7 C) 98.6 F (37 C)    TempSrc: Oral Oral    SpO2: 100% 100%    Weight:   65.5 kg   Height:        General: Pt is alert, awake, not in acute distress Cardiovascular: RRR, S1/S2 +, no rubs, no gallops Respiratory: CTA bilaterally, no wheezing, no rhonchi Abdominal: Soft, NT, ND, bowel sounds + Extremities: no edema, no cyanosis   The results of significant diagnostics from this hospitalization (including imaging, microbiology, ancillary and laboratory) are listed below for reference.    Significant Diagnostic Studies: CT ABDOMEN PELVIS WO CONTRAST  Result Date: 03/07/2021 CLINICAL DATA:  Abdominal pain. EXAM: CT ABDOMEN AND PELVIS WITHOUT CONTRAST TECHNIQUE: Multidetector CT imaging of the abdomen and pelvis was performed following the standard protocol without IV contrast. COMPARISON:  None. FINDINGS: Lower chest: No acute abnormality. Hepatobiliary: No focal liver abnormality is seen. Subcentimeter gallstones are seen within an otherwise normal appearing gallbladder. Pancreas: Unremarkable. No pancreatic ductal dilatation or surrounding inflammatory changes. Spleen: Normal in size without focal abnormality. Adrenals/Urinary Tract: Adrenal glands are unremarkable. Both kidneys are enlarged, without focal lesions. Mild bilateral hydronephrosis and hydroureter are present. A Foley catheter is seen within the urinary bladder. Stomach/Bowel: Stomach is within normal limits. The appendix is not clearly identified. No evidence of bowel wall thickening, distention, or inflammatory changes. Vascular/Lymphatic: No significant vascular findings are present. No enlarged abdominal or pelvic lymph nodes. Reproductive: Uterus and bilateral adnexa are unremarkable. Other: No abdominal wall hernia or abnormality. Moderate severity, diffuse presacral inflammatory fat stranding is noted. No abdominopelvic ascites. Musculoskeletal: No acute or significant osseous findings. IMPRESSION: 1. Mild bilateral  hydronephrosis and hydroureter, which may be secondary to the presence of a Foley catheter within the urinary bladder. 2. Cholelithiasis. Electronically Signed   By: Aram Candela M.D.   On: 03/07/2021 22:46   DG Chest Portable 1 View  Result Date: 02/23/2021 CLINICAL DATA:  Fever, cough EXAM: PORTABLE CHEST 1 VIEW COMPARISON:  None. FINDINGS: The heart size and mediastinal contours are within normal limits. Both lungs are clear. The visualized skeletal structures are unremarkable. IMPRESSION: No active disease. Electronically Signed   By: Charlett Nose M.D.   On: 02/23/2021 18:38    Microbiology: Recent Results (from the past 240 hour(s))  Urine Culture     Status: None   Collection Time: 03/07/21 10:00 PM   Specimen: Urine, Catheterized  Result Value Ref Range Status   Specimen Description   Final    URINE, CATHETERIZED Performed at [facility], [address], [place], [place] [address]    Special Requests   Final    NONE Performed at [facility], [address] [place], [place] [address]    Culture   Final    NO GROWTH Performed at [facility], [address] [place], [place] [address]    Report Status 03/09/2021 FINAL  Final  Resp Panel by RT-PCR (Flu A&B, Covid) Nasopharyngeal Swab     Status: None   Collection Time: 03/07/21 10:10 PM  Specimen: Nasopharyngeal Swab; Nasopharyngeal(NP) swabs in vial transport medium  Result Value Ref Range Status   SARS Coronavirus 2 by RT PCR NEGATIVE NEGATIVE Final    Comment: (NOTE) SARS-CoV-2 target nucleic acids are NOT DETECTED.  The SARS-CoV-2 RNA is generally detectable in upper respiratory specimens during the acute phase of infection. The lowest concentration of SARS-CoV-2 viral copies this assay can detect is 138 copies/mL. A negative result does not preclude SARS-Cov-2 infection and should not be used as the sole basis for treatment or other patient management decisions. A negative result may occur with  improper  specimen collection/handling, submission of specimen other than nasopharyngeal swab, presence of viral mutation(s) within the areas targeted by this assay, and inadequate number of viral copies(<138 copies/mL). A negative result must be combined with clinical observations, patient history, and epidemiological information. The expected result is Negative.  Fact Sheet for Patients:  BloggerCourse.com  Fact Sheet for Healthcare Providers:  SeriousBroker.it  This test is no t yet approved or cleared by the [place] FDA and  has been authorized for detection and/or diagnosis of SARS-CoV-2 by FDA under an Emergency Use Authorization (EUA). This EUA will remain  in effect (meaning this test can be used) for the duration of the COVID-19 declaration under Section 564(b)(1) of the Act, 21 U.S.C.section 360bbb-3(b)(1), unless the authorization is terminated  or revoked sooner.       Influenza A by PCR NEGATIVE NEGATIVE Final   Influenza B by PCR NEGATIVE NEGATIVE Final    Comment: (NOTE) The Xpert Xpress SARS-CoV-2/FLU/RSV plus assay is intended as an aid in the diagnosis of influenza from Nasopharyngeal swab specimens and should not be used as a sole basis for treatment. Nasal washings and aspirates are unacceptable for Xpert Xpress SARS-CoV-2/FLU/RSV testing.  Fact Sheet for Patients: BloggerCourse.com  Fact Sheet for Healthcare Providers: SeriousBroker.it  This test is not yet approved or cleared by the [place] FDA and has been authorized for detection and/or diagnosis of SARS-CoV-2 by FDA under an Emergency Use Authorization (EUA). This EUA will remain in effect (meaning this test can be used) for the duration of the COVID-19 declaration under Section 564(b)(1) of the Act, 21 U.S.C. section 360bbb-3(b)(1), unless the authorization is terminated or revoked.  Performed at  [facility], [address] [place], [place] [address]      Labs: Basic Metabolic Panel: Recent Labs  Lab 03/08/21 0543 03/08/21 0806 03/08/21 1735 03/09/21 0402 03/09/21 1637 03/10/21 0546  NA 142 142 147* 153* 144 140  K 3.6 3.1* 2.9* 3.3* 3.8 3.8  CL 103 106 113* 111 109 106  CO2 16* 16* 24 33* 28 29  GLUCOSE 150* 144* 186* 114* 109* 115*  BUN 194* 168* 112* 72* 32* 19  CREATININE 9.08* 6.89* 2.79* 1.50* 0.83 0.80  CALCIUM 9.7 8.6* 9.0 8.9 8.3* 8.1*  MG 3.0*  --  2.6*  --   --   --   PHOS 8.3*  --   --  2.8  --   --    Liver Function Tests: Recent Labs  Lab 03/07/21 2000 03/07/21 2059 03/08/21 0543 03/09/21 0402  AST 18 17 16   --   ALT 20 17 18   --   ALKPHOS 89 77 84  --   BILITOT 0.5 0.7 0.9  --   PROT 8.8* 7.6 8.7*  --   ALBUMIN 3.5 3.0* 3.4* 2.7*   Recent Labs  Lab 03/07/21 2000  LIPASE 90*   No results for input(s): AMMONIA  in the last 168 hours. CBC: Recent Labs  Lab 03/07/21 2000 03/08/21 0543 03/08/21 0806  WBC 14.2* 13.9* 10.8*  HGB 11.5* 11.8* 11.4*  HCT 32.9* 34.3* 33.2*  MCV 86.4 85.5 87.6  PLT 243 247 238   Cardiac Enzymes: No results for input(s): CKTOTAL, CKMB, CKMBINDEX, TROPONINI in the last 168 hours. BNP: Invalid input(s): POCBNP CBG: No results for input(s): GLUCAP in the last 168 hours.  Time coordinating discharge:  36 minutes  Signed:  Orson Eva, DO [facility] Pager: [phone] 03/11/2021, 10:29 AM

## 2021-03-11 NOTE — Plan of Care (Signed)
  Problem: Acute Rehab OT Goals (only OT should resolve) Goal: Pt. Will Perform Grooming Flowsheets (Taken 03/11/2021 0958) Pt Will Perform Grooming:  sitting  with min guard assist  with adaptive equipment Goal: Pt. Will Perform Upper Body Bathing Flowsheets (Taken 03/11/2021 0958) Pt Will Perform Upper Body Bathing:  sitting  with min guard assist Goal: Pt. Will Perform Lower Body Bathing Flowsheets (Taken 03/11/2021 0958) Pt Will Perform Lower Body Bathing:  with mod assist  with min assist  sitting/lateral leans  with adaptive equipment Goal: Pt. Will Perform Upper Body Dressing Flowsheets (Taken 03/11/2021 0958) Pt Will Perform Upper Body Dressing:  with supervision  with min guard assist  with adaptive equipment  sitting Goal: Pt. Will Perform Lower Body Dressing Flowsheets (Taken 03/11/2021 0958) Pt Will Perform Lower Body Dressing:  with min assist  sitting/lateral leans  with adaptive equipment Goal: Pt. Will Transfer To Toilet Flowsheets (Taken 03/11/2021 [phone]) Pt Will Transfer to Toilet:  with min assist  with mod assist  stand pivot transfer Goal: OT Additional ADL Goal #1 Flowsheets (Taken 03/11/2021 0958) Additional ADL Goal #1: Pt will demonstrate improved gross and fine motor coordination by improved independence with dressing and bathing tasks requiring B UE coordination and fine motor control.  Daivik Overley OT, MOT

## 2021-03-11 NOTE — TOC Transition Note (Signed)
Transition of Care [facility]) - CM/SW Discharge Note   Patient Details  Name: Melissa West MRN: [id number] Date of Birth: 11-29-1990  Transition of Care [facility]) CM/SW Contact:  Leitha Bleak, RN Phone Number: 03/11/2021, 2:10 PM   Clinical Narrative:   Insurance authorization received from [place]. Debbie provided room -B3-1 and number for report. Left mother a message. RN to call report. Medical necessity printed on 300. TOC will call EMS when RN is ready.   Final next level of care: Skilled Nursing Facility Barriers to Discharge: Barriers Resolved   Patient Goals and CMS Choice Patient states their goals for this hospitalization and ongoing recovery are:: going to SNF CMS Medicare.gov Compare Post Acute Care list provided to:: Patient Choice offered to / list presented to : Parent  Discharge Placement              Patient chooses bed at:  [facility]) Patient to be transferred to facility by: EM Name of family member notified: Victorino Dike - mother - message Patient and family notified of of transfer: 03/11/21  Discharge Plan and Services    [place]       Readmission Risk Interventions Readmission Risk Prevention Plan 03/11/2021 03/10/2021  Post Dischage Appt Complete -  Medication Screening Complete Complete  Transportation Screening Complete Complete  Some recent data might be hidden

## 2021-03-11 NOTE — Progress Notes (Signed)
03/11/2021 2246:  Pt transported to [facility] via [facility]. Report given by dayshift LPN. AVS and discharge given by dayshift LPN. Pt belongings at side.

## 2021-03-16 DIAGNOSIS — R339 Retention of urine, unspecified: Secondary | ICD-10-CM | POA: Diagnosis not present

## 2021-03-23 ENCOUNTER — Other Ambulatory Visit: Payer: Self-pay

## 2021-03-23 ENCOUNTER — Encounter: Payer: Self-pay | Admitting: Urology

## 2021-03-23 ENCOUNTER — Ambulatory Visit (INDEPENDENT_AMBULATORY_CARE_PROVIDER_SITE_OTHER): Payer: Medicare Other | Admitting: Urology

## 2021-03-23 VITALS — BP 106/69 | HR 98

## 2021-03-23 DIAGNOSIS — R339 Retention of urine, unspecified: Secondary | ICD-10-CM

## 2021-03-23 NOTE — Progress Notes (Signed)
Urological Symptom Review  Patient is experiencing the following symptoms: none   Review of Systems  Gastrointestinal (upper)  : Negative for upper GI symptoms  Gastrointestinal (lower) : Negative for lower GI symptoms  Constitutional : Negative for symptoms  Skin: Negative for skin symptoms  Eyes: Negative for eye symptoms  Ear/Nose/Throat : Negative for Ear/Nose/Throat symptoms  Hematologic/Lymphatic: Negative for Hematologic/Lymphatic symptoms  Cardiovascular : Negative for cardiovascular symptoms  Respiratory : Negative for respiratory symptoms  Endocrine: Negative for endocrine symptoms  Musculoskeletal: Back pain  Neurological: Negative for neurological symptoms  Psychologic: Negative for psychiatric symptoms  

## 2021-03-23 NOTE — Progress Notes (Signed)
03/23/2021 1:59 PM   Melissa West [birth year] [id number]  Referring provider: Benita Stabile, MD [address] [place],  [place] [address]  Urinary retention   HPI: Melissa West is a 30yo here for evaluation of urinary retention. She was admitted 11/26 with acute renal failure and urinary retention. She had 2L in her bladder at that time and had a foley placed. Creatinine was 15 on admission and normalized to 0.8. She has a hx of Melissa diagnosed at age 44. She does not have sensation to urinate with the foley in place. She denies urinary urgency. No issues with constipation. She was previously on gemtesa prior to hospital admission   PMH: Past Medical History:  Diagnosis Date   Melissa (multiple sclerosis) (HCC)    No pertinent past medical history     Surgical History: Past Surgical History:  Procedure Laterality Date   CESAREAN SECTION  08/09/2011   Procedure: CESAREAN SECTION;  Surgeon: Lesly Dukes, MD;  Location: [facility];  Service: Gynecology;  Laterality: N/A;    Home Medications:  Allergies as of 03/23/2021   No Known Allergies      Medication List        Accurate as of March 23, 2021  1:59 PM. If you have any questions, ask your nurse or doctor.          albuterol 108 (90 Base) MCG/ACT inhaler Commonly known as: VENTOLIN HFA Inhale 1-2 puffs into the lungs every 6 (six) hours as needed for wheezing or shortness of breath.   fluticasone 50 MCG/ACT nasal spray Commonly known as: FLONASE Place 2 sprays into both nostrils daily.   ondansetron 4 MG disintegrating tablet Commonly known as: Zofran ODT Take 1 tablet (4 mg total) by mouth every 8 (eight) hours as needed for nausea or vomiting.   Vitamin D (Ergocalciferol) 1.25 MG (50000 UNIT) Caps capsule Commonly known as: DRISDOL TAKE 1 CAPSULE BY MOUTH WEEKLY.        Allergies: No Known Allergies  Family History: Family History  Problem Relation Age of Onset   Diabetes Maternal Grandmother     Hypertension Maternal Grandmother    Kidney disease Maternal Grandmother     Social History:  reports that she has never smoked. She has never used smokeless tobacco. She reports that she does not drink alcohol and does not use drugs.  ROS: All other review of systems were reviewed and are negative except what is noted above in HPI  Physical Exam: BP 106/69   Pulse 98   Constitutional:  Alert and oriented, No acute distress. HEENT: [place] AT, moist mucus membranes.  Trachea midline, no masses. Cardiovascular: No clubbing, cyanosis, or edema. Respiratory: Normal respiratory effort, no increased work of breathing. GI: Abdomen is soft, nontender, nondistended, no abdominal masses GU: No CVA tenderness.  Lymph: No cervical or inguinal lymphadenopathy. Skin: No rashes, bruises or suspicious lesions. Neurologic: Grossly intact, no focal deficits, moving all 4 extremities. Psychiatric: Normal mood and affect.  Laboratory Data: Lab Results  Component Value Date   WBC 10.8 (H) 03/08/2021   HGB 11.4 (L) 03/08/2021   HCT 33.2 (L) 03/08/2021   MCV 87.6 03/08/2021   PLT 238 03/08/2021    Lab Results  Component Value Date   CREATININE 0.80 03/10/2021    No results found for: PSA  No results found for: TESTOSTERONE  No results found for: HGBA1C  Urinalysis    Component Value Date/Time   COLORURINE YELLOW 03/07/2021 2200  APPEARANCEUR CLEAR 03/07/2021 2200   LABSPEC 1.015 03/07/2021 2200   PHURINE 5.0 03/07/2021 2200   GLUCOSEU NEGATIVE 03/07/2021 2200   HGBUR MODERATE (A) 03/07/2021 2200   BILIRUBINUR NEGATIVE 03/07/2021 2200   KETONESUR NEGATIVE 03/07/2021 2200   PROTEINUR NEGATIVE 03/07/2021 2200   UROBILINOGEN 0.2 06/13/2009 1128   NITRITE NEGATIVE 03/07/2021 2200   LEUKOCYTESUR NEGATIVE 03/07/2021 2200    Lab Results  Component Value Date   BACTERIA RARE (A) 03/07/2021    Pertinent Imaging:  No results found for this or any previous visit.  No results found  for this or any previous visit.  No results found for this or any previous visit.  No results found for this or any previous visit.  No results found for this or any previous visit.  No results found for this or any previous visit.  No results found for this or any previous visit.  No results found for this or any previous visit.   Assessment & Plan:    1. Urinary retention -We will schedule for UDS due to hx of Melissa and large volume urinary retention   No follow-ups on file.  Wilkie Aye, MD  [facility] [place]

## 2021-03-23 NOTE — Patient Instructions (Signed)
Acute Urinary Retention, Female ?Acute urinary retention is a condition in which a person is unable to pass urine or can only pass a little urine. This condition can happen suddenly and last for a short time. If left untreated, it can become long-term (chronic) and result in kidney damage or other serious complications. ?What are the causes? ?This condition may be caused by: ?Obstruction or narrowing of the tube that drains the bladder (urethra). This may be caused by surgery, problems with nearby organs, or injury to the bladder or urethra. ?Problems with the nerves in the bladder. ?Pelvic organ prolapse. ?Tumors in the area of the pelvis, bladder, or urethra. ?Vaginal childbirth. ?Bladder or urinary tract infection. ?Constipation. ?Certain medicines. ?What increases the risk? ?This condition is more likely to develop in women over age 50. Other chronic health conditions can increase the risk of acute urinary retention. These include: ?Diseases such as multiple sclerosis. ?Spinal cord injuries. ?Diabetes. ?Degenerative cognitive conditions, such as delirium or dementia. ?Psychological conditions. A woman may hold her urine due to trauma or because she does not want to use the bathroom. ?History of preexisting urinary retention. ?History of prior pelvic surgery, incontinence surgery, or radical pelvic surgery. ?What are the signs or symptoms? ?Symptoms of this condition include: ?Trouble urinating. ?Pain in the lower abdomen. ?How is this diagnosed? ?This condition is diagnosed based on a physical exam and your medical history. You may also have other tests, including: ?An ultrasound of the bladder or kidneys or both. ?Blood tests. ?A urine analysis. ?Additional tests may be needed, such as a CT scan, MRI, and kidney or bladder function tests. ?How is this treated? ?Treatment for this condition may include: ?Medicines. ?Placing a thin, sterile tube (catheter) into the bladder to drain urine out of the body. This is  called an indwelling urinary catheter. After it is inserted, the catheter is held in place with a small balloon that is filled with sterile water. Urine drains from the catheter into a collection bag outside of the body. ?Behavioral therapy. ?Treatment for other conditions. ?If needed, you may be treated in the hospital for kidney function problems or to manage other complications. ?Follow these instructions at home: ?Medicines ?Take over-the-counter and prescription medicines only as told by your health care provider. Avoid certain medicines, such as decongestants, antihistamines, and some prescription medicines. Do not take any medicine unless your health care provider approves. ?If you were prescribed an antibiotic medicine, take it as told by your health care provider. Do not stop using the antibiotic even if you start to feel better. ?General instructions ?Do not use any products that contain nicotine or tobacco. These products include cigarettes, chewing tobacco, and vaping devices, such as e-cigarettes. If you need help quitting, ask your health care provider. ?Drink enough fluid to keep your urine pale yellow. ?If you have an indwelling urinary catheter, follow the instructions from your health care provider. ?Monitor any changes in your symptoms. Tell your health care provider about any changes. ?If instructed, monitor your blood pressure at home. Report changes as told by your health care provider. ?Keep all follow-up visits. This is important. ?Contact a health care provider if: ?You have uncomfortable bladder contractions that you cannot control (spasms). ?You leak urine with the spasms. ?Get help right away if: ?You have chills or a fever. ?You have blood in your urine. ?You have a catheter and the following happens: ?Your catheter stops draining urine. ?Your catheter falls out. ?Summary ?Acute urinary retention is a condition   in which a person is unable to pass urine or can only pass a little urine. If  left untreated, this can result in kidney damage or other serious complications. ?One cause of this condition may be obstruction or narrowing of the tube that drains the bladder (urethra). This may be caused by surgery, problems with nearby organs, or injury to the bladder or urethra. ?Treatment may include medicines and placement of an indwelling urinary catheter. ?Monitor any changes in your symptoms. Tell your health care provider about any changes. ?This information is not intended to replace advice given to you by your health care provider. Make sure you discuss any questions you have with your health care provider. ?Document Revised: 12/19/2019 Document Reviewed: 12/19/2019 ?Elsevier Patient Education ? 2022 [employer] ? ?

## 2021-04-07 ENCOUNTER — Emergency Department (HOSPITAL_COMMUNITY)
Admission: EM | Admit: 2021-04-07 | Discharge: 2021-04-07 | Disposition: A | Discharge status: Home / Self Care | Payer: Medicare Other | Attending: Emergency Medicine | Admitting: Emergency Medicine

## 2021-04-07 ENCOUNTER — Other Ambulatory Visit: Payer: Self-pay

## 2021-04-07 ENCOUNTER — Encounter (HOSPITAL_COMMUNITY): Payer: Self-pay

## 2021-04-07 DIAGNOSIS — R5381 Other malaise: Secondary | ICD-10-CM | POA: Diagnosis not present

## 2021-04-07 DIAGNOSIS — T83098A Other mechanical complication of other indwelling urethral catheter, initial encounter: Secondary | ICD-10-CM | POA: Diagnosis not present

## 2021-04-07 DIAGNOSIS — R339 Retention of urine, unspecified: Secondary | ICD-10-CM | POA: Insufficient documentation

## 2021-04-07 DIAGNOSIS — T83091A Other mechanical complication of indwelling urethral catheter, initial encounter: Secondary | ICD-10-CM | POA: Diagnosis not present

## 2021-04-07 DIAGNOSIS — T839XXA Unspecified complication of genitourinary prosthetic device, implant and graft, initial encounter: Secondary | ICD-10-CM

## 2021-04-07 NOTE — ED Triage Notes (Signed)
Patient brought in by [facility] because her foley would not drain.  EMS said they tried to flush the catheter but they were not able to get anything to go in or come out.

## 2021-04-07 NOTE — Discharge Instructions (Signed)
Your Foley catheter has been replaced today.  Please keep the Foley catheter in place until you are seen by the urology specialist.  Return to the emergency department for any new or worsening symptoms.

## 2021-04-07 NOTE — ED Provider Notes (Signed)
[facility] EMERGENCY DEPARTMENT Provider Note   [name]: [id number] Arrival date & time: 04/07/21  1137     History Chief Complaint  Patient presents with   Unable to Drain Foley    Jacinda Faraci is a 30 y.o. female.  HPI      Jenene Mulcahey is a 30 y.o. female with history of MS and foley catheter in place secondary to recent hospital admission for acute renal failure, presents to the Emergency Department for problem with foley catheter not draining.  Pt states that she was recently discharged home from [facility] and was not instructed on how to care for her foley catheter.  States that she and family attempted to flush it, but catheter was not draining.  She denies abdominal pain, fever, chills and leaking from the catheter.  Seen recently by urology, Awaiting appt with urology specialist.    Past Medical History:  Diagnosis Date   MS (multiple sclerosis) (HCC)    No pertinent past medical history     Patient Active Problem List   Diagnosis Date Noted   Hypernatremia 03/09/2021   Hyperkalemia 03/08/2021   Leukocytosis 03/08/2021   Mild protein-calorie malnutrition (HCC) 03/08/2021   Acute urinary retention 03/08/2021   Pressure injury of skin 03/08/2021   AKI (acute kidney injury) (HCC) 03/07/2021   Rash 03/29/2019   Wheelchair bound 11/29/2018   Ataxia 11/29/2018   Nystagmus 11/29/2018   Urinary incontinence, urge 11/29/2018   MS (multiple sclerosis) (HCC)    Neurological deficit present 11/11/2010    Past Surgical History:  Procedure Laterality Date   CESAREAN SECTION  08/09/2011   Procedure: CESAREAN SECTION;  Surgeon: Lesly Dukes, MD;  Location: [facility];  Service: Gynecology;  Laterality: N/A;     OB History     Gravida  1   Para  1   Term  1   Preterm  0   AB  0   Living  2      SAB  0   IAB  0   Ectopic  0   Multiple  1   Live Births  2           Family History  Problem Relation Age of Onset   Diabetes Maternal  Grandmother    Hypertension Maternal Grandmother    Kidney disease Maternal Grandmother     Social History   Tobacco Use   Smoking status: Never   Smokeless tobacco: Never  Substance Use Topics   Alcohol use: No   Drug use: No    Home Medications Prior to Admission medications   Medication Sig Start Date End Date Taking? Authorizing Provider  albuterol (VENTOLIN HFA) 108 (90 Base) MCG/ACT inhaler Inhale 1-2 puffs into the lungs every 6 (six) hours as needed for wheezing or shortness of breath. 02/23/21   Long, Arlyss Repress, MD  fluticasone (FLONASE) 50 MCG/ACT nasal spray Place 2 sprays into both nostrils daily. 02/23/21   Long, Arlyss Repress, MD  ondansetron (ZOFRAN ODT) 4 MG disintegrating tablet Take 1 tablet (4 mg total) by mouth every 8 (eight) hours as needed for nausea or vomiting. 02/23/21   Long, Arlyss Repress, MD  Vitamin D, Ergocalciferol, (DRISDOL) 1.25 MG (50000 UNIT) CAPS capsule TAKE 1 CAPSULE BY MOUTH WEEKLY. 01/17/20   Sater, Pearletha Furl, MD    Allergies    Patient has no known allergies.  Review of Systems   Review of Systems  Constitutional:  Negative for chills, fatigue and fever.  Respiratory:  Negative for shortness of breath.   Cardiovascular:  Negative for chest pain and leg swelling.  Gastrointestinal:  Negative for abdominal pain, diarrhea, nausea and vomiting.  Genitourinary:  Negative for decreased urine volume, dysuria, flank pain and hematuria.       Foley catheter not draining  Musculoskeletal:  Negative for arthralgias, back pain and myalgias.  Skin:  Negative for color change and rash.  Neurological:  Negative for weakness and numbness.  All other systems reviewed and are negative.  Physical Exam Updated Vital Signs BP 98/77 (BP Location: Left Arm)    Pulse 81    Temp 97.9 F (36.6 C) (Oral)    Resp 16    Ht 5\' 3"  (1.6 m)    Wt 65.5 kg    SpO2 100%    BMI 25.58 kg/m   Physical Exam Constitutional:      General: She is not in acute distress.     Appearance: Normal appearance. She is not ill-appearing or toxic-appearing.  HENT:     Head: Normocephalic.  Cardiovascular:     Rate and Rhythm: Normal rate and regular rhythm.     Pulses: Normal pulses.  Pulmonary:     Effort: Pulmonary effort is normal.     Breath sounds: Normal breath sounds.  Abdominal:     General: There is no distension.     Palpations: Abdomen is soft.     Tenderness: There is no abdominal tenderness. There is no guarding.  Musculoskeletal:        General: No tenderness. Normal range of motion.  Skin:    General: Skin is warm.     Findings: No rash.  Neurological:     Mental Status: She is alert.     Sensory: No sensory deficit.     Motor: No weakness.    ED Results / Procedures / Treatments   Labs (all labs ordered are listed, but only abnormal results are displayed) Labs Reviewed - No data to display  EKG None  Radiology No results found.  Procedures Procedures   Medications Ordered in ED Medications - No data to display  ED Course  I have reviewed the triage vital signs and the nursing notes.  Pertinent labs & imaging results that were available during my care of the patient were reviewed by me and considered in my medical decision making (see chart for details).    MDM Rules/Calculators/A&P                          Patient here with complaint of obstructed Foley catheter.  Patient has history of MS and does not have sensation to urinate with Foley catheter in place.  Seen by urology on 03/23/2021 after hospital admission for acute renal failure and large volume urinary retention. Pt requesting to have catheter removed   On exam, patient well-appearing, denies any pain or symptoms at present.  Bladder scan reveals 94 cc urine within the bladder.  Foley catheter has approximately 1200 cc dark-colored urine present in the bag.  Significant amount of sediment present in the catheter tubing.  No blood clots.  Pt well appearing, non toxic.   No abd pain on exam.  Doubt emergent process.   Consulted family member on file, family member unable to provide relevant historical information.  [facility], Dr. Alyson Ingles and discussed findings.  He recommends to keep foley in place and replace it with larger size.  Foley replaced by nursing staff w/o  difficulty, pt observed in dept and foley draining well.  Pt will keep previously scheduled appt with urology specialist.        Final Clinical Impression(s) / ED Diagnoses Final diagnoses:  Problem with Foley catheter, initial encounter [facility])    Rx / DC Orders ED Discharge Orders     None        Kem Parkinson, PA-C 04/10/21 2240    Daleen Bo, MD 04/11/21 [phone]

## 2021-04-07 NOTE — ED Notes (Signed)
Patient bladder scanned: 94 mL showing.

## 2021-04-08 DIAGNOSIS — K5900 Constipation, unspecified: Secondary | ICD-10-CM | POA: Diagnosis not present

## 2021-04-08 DIAGNOSIS — G35 Multiple sclerosis: Secondary | ICD-10-CM | POA: Diagnosis not present

## 2021-04-08 DIAGNOSIS — R339 Retention of urine, unspecified: Secondary | ICD-10-CM | POA: Diagnosis not present

## 2021-04-11 DIAGNOSIS — E559 Vitamin D deficiency, unspecified: Secondary | ICD-10-CM | POA: Diagnosis not present

## 2021-04-11 DIAGNOSIS — R531 Weakness: Secondary | ICD-10-CM | POA: Diagnosis not present

## 2021-04-11 DIAGNOSIS — Z466 Encounter for fitting and adjustment of urinary device: Secondary | ICD-10-CM | POA: Diagnosis not present

## 2021-04-11 DIAGNOSIS — J309 Allergic rhinitis, unspecified: Secondary | ICD-10-CM | POA: Diagnosis not present

## 2021-04-11 DIAGNOSIS — D649 Anemia, unspecified: Secondary | ICD-10-CM | POA: Diagnosis not present

## 2021-04-11 DIAGNOSIS — Z7951 Long term (current) use of inhaled steroids: Secondary | ICD-10-CM | POA: Diagnosis not present

## 2021-04-11 DIAGNOSIS — J45909 Unspecified asthma, uncomplicated: Secondary | ICD-10-CM | POA: Diagnosis not present

## 2021-04-11 DIAGNOSIS — G35 Multiple sclerosis: Secondary | ICD-10-CM | POA: Diagnosis not present

## 2021-04-11 DIAGNOSIS — R339 Retention of urine, unspecified: Secondary | ICD-10-CM | POA: Diagnosis not present

## 2021-04-16 ENCOUNTER — Encounter: Payer: Self-pay | Admitting: Neurology

## 2021-04-16 ENCOUNTER — Ambulatory Visit: Payer: Medicare Other | Admitting: Neurology

## 2021-04-19 DIAGNOSIS — J309 Allergic rhinitis, unspecified: Secondary | ICD-10-CM | POA: Diagnosis not present

## 2021-04-19 DIAGNOSIS — Z7951 Long term (current) use of inhaled steroids: Secondary | ICD-10-CM | POA: Diagnosis not present

## 2021-04-19 DIAGNOSIS — J45909 Unspecified asthma, uncomplicated: Secondary | ICD-10-CM | POA: Diagnosis not present

## 2021-04-19 DIAGNOSIS — R531 Weakness: Secondary | ICD-10-CM | POA: Diagnosis not present

## 2021-04-19 DIAGNOSIS — G35 Multiple sclerosis: Secondary | ICD-10-CM | POA: Diagnosis not present

## 2021-04-19 DIAGNOSIS — Z466 Encounter for fitting and adjustment of urinary device: Secondary | ICD-10-CM | POA: Diagnosis not present

## 2021-04-19 DIAGNOSIS — D649 Anemia, unspecified: Secondary | ICD-10-CM | POA: Diagnosis not present

## 2021-04-19 DIAGNOSIS — E559 Vitamin D deficiency, unspecified: Secondary | ICD-10-CM | POA: Diagnosis not present

## 2021-04-19 DIAGNOSIS — R339 Retention of urine, unspecified: Secondary | ICD-10-CM | POA: Diagnosis not present

## 2021-04-20 ENCOUNTER — Telehealth: Payer: Self-pay | Admitting: Neurology

## 2021-04-20 NOTE — Telephone Encounter (Signed)
Pt is asking for a call to discuss going back on infusion treatments, please call.

## 2021-04-20 NOTE — Telephone Encounter (Signed)
Please let pt know that since she has not been seen since 07/2019, she will need to be seen first for an appointment to further discuss.

## 2021-04-20 NOTE — Telephone Encounter (Signed)
Pt was called back, pt agreed to have current appointment on wait list to try and be seen before July.  Pt understands she must be seen 1st.  This is FYI back to POD 1, no call back requested from pt.

## 2021-04-21 ENCOUNTER — Ambulatory Visit: Payer: Medicare Other

## 2021-04-22 ENCOUNTER — Telehealth: Payer: Self-pay | Admitting: Neurology

## 2021-04-22 DIAGNOSIS — J45909 Unspecified asthma, uncomplicated: Secondary | ICD-10-CM | POA: Diagnosis not present

## 2021-04-22 DIAGNOSIS — E559 Vitamin D deficiency, unspecified: Secondary | ICD-10-CM | POA: Diagnosis not present

## 2021-04-22 DIAGNOSIS — D649 Anemia, unspecified: Secondary | ICD-10-CM | POA: Diagnosis not present

## 2021-04-22 DIAGNOSIS — J309 Allergic rhinitis, unspecified: Secondary | ICD-10-CM | POA: Diagnosis not present

## 2021-04-22 DIAGNOSIS — R531 Weakness: Secondary | ICD-10-CM | POA: Diagnosis not present

## 2021-04-22 DIAGNOSIS — Z7951 Long term (current) use of inhaled steroids: Secondary | ICD-10-CM | POA: Diagnosis not present

## 2021-04-22 DIAGNOSIS — R339 Retention of urine, unspecified: Secondary | ICD-10-CM | POA: Diagnosis not present

## 2021-04-22 DIAGNOSIS — G35 Multiple sclerosis: Secondary | ICD-10-CM | POA: Diagnosis not present

## 2021-04-22 DIAGNOSIS — Z466 Encounter for fitting and adjustment of urinary device: Secondary | ICD-10-CM | POA: Diagnosis not present

## 2021-04-22 NOTE — Telephone Encounter (Signed)
Pt called needing to speak to the RN regarding her PT and her Medication's. Please advise.

## 2021-04-22 NOTE — Telephone Encounter (Signed)
Called the patient back. She states that PT is recommending the patient be seen. I advised that we just spoke with her yesterday and got her scheduled for the first available slot. Advised that we would keep her on the wait list and try and get her in sooner if something comes available. Pt verbalized understanding.

## 2021-04-24 DIAGNOSIS — R339 Retention of urine, unspecified: Secondary | ICD-10-CM | POA: Diagnosis not present

## 2021-04-24 DIAGNOSIS — J45909 Unspecified asthma, uncomplicated: Secondary | ICD-10-CM | POA: Diagnosis not present

## 2021-04-24 DIAGNOSIS — Z466 Encounter for fitting and adjustment of urinary device: Secondary | ICD-10-CM | POA: Diagnosis not present

## 2021-04-24 DIAGNOSIS — R531 Weakness: Secondary | ICD-10-CM | POA: Diagnosis not present

## 2021-04-24 DIAGNOSIS — D649 Anemia, unspecified: Secondary | ICD-10-CM | POA: Diagnosis not present

## 2021-04-24 DIAGNOSIS — G35 Multiple sclerosis: Secondary | ICD-10-CM | POA: Diagnosis not present

## 2021-04-24 DIAGNOSIS — J309 Allergic rhinitis, unspecified: Secondary | ICD-10-CM | POA: Diagnosis not present

## 2021-04-24 DIAGNOSIS — E559 Vitamin D deficiency, unspecified: Secondary | ICD-10-CM | POA: Diagnosis not present

## 2021-04-24 DIAGNOSIS — Z7951 Long term (current) use of inhaled steroids: Secondary | ICD-10-CM | POA: Diagnosis not present

## 2021-04-25 DIAGNOSIS — E559 Vitamin D deficiency, unspecified: Secondary | ICD-10-CM | POA: Diagnosis not present

## 2021-04-25 DIAGNOSIS — Z7951 Long term (current) use of inhaled steroids: Secondary | ICD-10-CM | POA: Diagnosis not present

## 2021-04-25 DIAGNOSIS — D649 Anemia, unspecified: Secondary | ICD-10-CM | POA: Diagnosis not present

## 2021-04-25 DIAGNOSIS — Z466 Encounter for fitting and adjustment of urinary device: Secondary | ICD-10-CM | POA: Diagnosis not present

## 2021-04-25 DIAGNOSIS — G35 Multiple sclerosis: Secondary | ICD-10-CM | POA: Diagnosis not present

## 2021-04-25 DIAGNOSIS — J309 Allergic rhinitis, unspecified: Secondary | ICD-10-CM | POA: Diagnosis not present

## 2021-04-25 DIAGNOSIS — R531 Weakness: Secondary | ICD-10-CM | POA: Diagnosis not present

## 2021-04-25 DIAGNOSIS — R339 Retention of urine, unspecified: Secondary | ICD-10-CM | POA: Diagnosis not present

## 2021-04-25 DIAGNOSIS — J45909 Unspecified asthma, uncomplicated: Secondary | ICD-10-CM | POA: Diagnosis not present

## 2021-04-30 ENCOUNTER — Telehealth: Payer: Self-pay | Admitting: Neurology

## 2021-04-30 ENCOUNTER — Other Ambulatory Visit: Payer: Self-pay

## 2021-04-30 ENCOUNTER — Emergency Department (HOSPITAL_COMMUNITY)
Admission: EM | Admit: 2021-04-30 | Discharge: 2021-04-30 | Disposition: A | Discharge status: Home / Self Care | Payer: Medicare Other | Attending: Emergency Medicine | Admitting: Emergency Medicine

## 2021-04-30 DIAGNOSIS — T83098A Other mechanical complication of other indwelling urethral catheter, initial encounter: Secondary | ICD-10-CM | POA: Insufficient documentation

## 2021-04-30 DIAGNOSIS — R531 Weakness: Secondary | ICD-10-CM | POA: Diagnosis not present

## 2021-04-30 DIAGNOSIS — Z7951 Long term (current) use of inhaled steroids: Secondary | ICD-10-CM | POA: Diagnosis not present

## 2021-04-30 DIAGNOSIS — T839XXA Unspecified complication of genitourinary prosthetic device, implant and graft, initial encounter: Secondary | ICD-10-CM

## 2021-04-30 DIAGNOSIS — J45909 Unspecified asthma, uncomplicated: Secondary | ICD-10-CM | POA: Diagnosis not present

## 2021-04-30 DIAGNOSIS — Y846 Urinary catheterization as the cause of abnormal reaction of the patient, or of later complication, without mention of misadventure at the time of the procedure: Secondary | ICD-10-CM | POA: Insufficient documentation

## 2021-04-30 DIAGNOSIS — D649 Anemia, unspecified: Secondary | ICD-10-CM | POA: Diagnosis not present

## 2021-04-30 DIAGNOSIS — E559 Vitamin D deficiency, unspecified: Secondary | ICD-10-CM | POA: Diagnosis not present

## 2021-04-30 DIAGNOSIS — Z743 Need for continuous supervision: Secondary | ICD-10-CM | POA: Diagnosis not present

## 2021-04-30 DIAGNOSIS — Z466 Encounter for fitting and adjustment of urinary device: Secondary | ICD-10-CM | POA: Diagnosis not present

## 2021-04-30 DIAGNOSIS — R5381 Other malaise: Secondary | ICD-10-CM | POA: Diagnosis not present

## 2021-04-30 DIAGNOSIS — G35 Multiple sclerosis: Secondary | ICD-10-CM | POA: Diagnosis not present

## 2021-04-30 DIAGNOSIS — R6889 Other general symptoms and signs: Secondary | ICD-10-CM | POA: Diagnosis not present

## 2021-04-30 DIAGNOSIS — J309 Allergic rhinitis, unspecified: Secondary | ICD-10-CM | POA: Diagnosis not present

## 2021-04-30 DIAGNOSIS — R339 Retention of urine, unspecified: Secondary | ICD-10-CM | POA: Diagnosis not present

## 2021-04-30 NOTE — ED Provider Notes (Signed)
[facility] EMERGENCY DEPARTMENT Provider Note   [name]: [id number] Arrival date & time: 04/30/21  1024     History  Chief Complaint  Patient presents with   Foley Problem    Melissa West is a 31 y.o. female presenting for Foley problem.  Patient states her aide noticed that she was leaking from the bottom of her Foley bag this morning.  Thus EMS was called.  Patient has not had any fevers, chills, nausea, vomiting, abdominal pain, or leaking from the urethra around the Foley.  She has an appoint with urology tomorrow for further management of the Foley.  HPI     Home Medications Prior to Admission medications   Medication Sig Start Date End Date Taking? Authorizing Provider  albuterol (VENTOLIN HFA) 108 (90 Base) MCG/ACT inhaler Inhale 1-2 puffs into the lungs every 6 (six) hours as needed for wheezing or shortness of breath. 02/23/21   Long, Arlyss Repress, MD  fluticasone (FLONASE) 50 MCG/ACT nasal spray Place 2 sprays into both nostrils daily. 02/23/21   Long, Arlyss Repress, MD  ondansetron (ZOFRAN ODT) 4 MG disintegrating tablet Take 1 tablet (4 mg total) by mouth every 8 (eight) hours as needed for nausea or vomiting. 02/23/21   Long, Arlyss Repress, MD  Vitamin D, Ergocalciferol, (DRISDOL) 1.25 MG (50000 UNIT) CAPS capsule TAKE 1 CAPSULE BY MOUTH WEEKLY. 01/17/20   Sater, Pearletha Furl, MD      Allergies    Patient has no known allergies.    Review of Systems   Review of Systems  Constitutional:  Negative for fever.  Gastrointestinal:  Negative for abdominal pain, nausea and vomiting.   Physical Exam Updated Vital Signs BP 98/84 (BP Location: Left Arm)    Pulse 95    Temp 98.3 F (36.8 C) (Oral)    Resp 18    Ht 5\' 3"  (1.6 m) Comment: Simultaneous filing. User may not have seen previous data.   Wt 83.9 kg Comment: Simultaneous filing. User may not have seen previous data.   SpO2 100%    BMI 32.77 kg/m  Physical Exam Vitals and nursing note reviewed.  Constitutional:      General:  She is not in acute distress.    Appearance: She is well-developed.  HENT:     Head: Normocephalic and atraumatic.  Eyes:     Extraocular Movements: Extraocular movements intact.  Cardiovascular:     Rate and Rhythm: Normal rate and regular rhythm.  Pulmonary:     Effort: Pulmonary effort is normal.     Breath sounds: Normal breath sounds.  Abdominal:     General: There is no distension.     Palpations: There is no mass.     Tenderness: There is no abdominal tenderness. There is no guarding or rebound.     Comments: No ttp of the abd  Musculoskeletal:        General: Normal range of motion.     Cervical back: Normal range of motion.  Skin:    General: Skin is warm.     Capillary Refill: Capillary refill takes less than 2 seconds.     Findings: No rash.  Neurological:     Mental Status: She is alert and oriented to person, place, and time.    ED Results / Procedures / Treatments   Labs (all labs ordered are listed, but only abnormal results are displayed) Labs Reviewed - No data to display  EKG None  Radiology No results found.  Procedures Procedures  Medications Ordered in ED Medications - No data to display  ED Course/ Medical Decision Making/ A&P                           Medical Decision Making   This patient presents to the ED for concern of foley bag leakage.  On further investigation, there was leaking from the bottom of the Foley bag, but not around the urethra/Foley insertion area.  Patient states EMS clamped the Foley at the bottom of the bag where is is meant to be secured, and leaking has since stopped.   Per chart review, pt had foley changed 04/07/2021 while in the ER.   Disposition:  I feel that the patent would benefit from outpatient management with follow-up with her urologist tomorrow.  As she has no complaints, do not feel she needs emergent foley replacement.  The leaking has stopped with appropriate closure of the Foley bag.  At this  time, patient appears safe for discharge.  Return precautions given.  Patient states she understands and agrees to plan   Final Clinical Impression(s) / ED Diagnoses Final diagnoses:  Problem with Foley catheter, initial encounter [facility])    Rx / DC Orders ED Discharge Orders     None         Alveria Apley, PA-C 04/30/21 1056    Linwood Dibbles, MD 05/01/21 [phone]

## 2021-04-30 NOTE — ED Triage Notes (Signed)
Patient states her catheter was leaking and has had catheter since hospitalization on 03/06/21. EMS placed foley in clamp and no leaking noted in triage. Patient states she is supposed to go to doctor tomorrow to evaluate for catheter need.

## 2021-04-30 NOTE — Telephone Encounter (Signed)
I called the pt and we discussed message. We were able to get the pt moved up to 05/13/21 at 1130 am with Dr. Epimenio Foot for eval. Pt was also advised we would keep her on the wait list if a sooner appt became available.  Pt verbalized understanding/appreciation for the call.

## 2021-04-30 NOTE — Discharge Instructions (Signed)
Follow-up with urologist tomorrow to schedule appointment for further management of your Foley catheter. Return to the emergency room with any new, worsening, concerning symptoms

## 2021-04-30 NOTE — Telephone Encounter (Signed)
Pt's father Melissa West and the pt both on the line called stating that pt needs to be put back on MS medications. Pt has not been on them for a while and father states that the MS is taking over her body and that the PT she see's advised for pt to get back on her MS medications. Father is not on DPR  but pt who was confirmed stated that we were able to speak to the both of them on Speaker. Pt states that she was on Tysabri in the past. Please advise. You can call pt on [phone] and if she is not available she states her father can be called on [phone]

## 2021-05-01 ENCOUNTER — Ambulatory Visit (INDEPENDENT_AMBULATORY_CARE_PROVIDER_SITE_OTHER): Payer: Medicare Other | Admitting: Urology

## 2021-05-01 VITALS — BP 103/69 | HR 97

## 2021-05-01 DIAGNOSIS — R339 Retention of urine, unspecified: Secondary | ICD-10-CM

## 2021-05-01 NOTE — Progress Notes (Signed)
Urological Symptom Review  Patient is experiencing the following symptoms: none   Review of Systems  Gastrointestinal (upper)  : Negative for upper GI symptoms  Gastrointestinal (lower) : Negative for lower GI symptoms  Constitutional : Fatigue  Skin: Itching  Eyes: Negative for eye symptoms  Ear/Nose/Throat : Negative for Ear/Nose/Throat symptoms  Hematologic/Lymphatic: Negative for Hematologic/Lymphatic symptoms  Cardiovascular : Feet swelling  Respiratory : Negative for respiratory symptoms  Endocrine: Negative for endocrine symptoms  Musculoskeletal: Back pain  Neurological: Negative for neurological symptoms  Psychologic: Negative for psychiatric symptoms

## 2021-05-05 NOTE — Progress Notes (Signed)
Patient did not have UDS and was rescheduled

## 2021-05-06 ENCOUNTER — Encounter (HOSPITAL_COMMUNITY): Payer: Self-pay | Admitting: Emergency Medicine

## 2021-05-06 ENCOUNTER — Emergency Department (HOSPITAL_COMMUNITY)
Admission: EM | Admit: 2021-05-06 | Discharge: 2021-05-07 | Disposition: A | Discharge status: Home / Self Care | Payer: Medicare Other | Attending: Emergency Medicine | Admitting: Emergency Medicine

## 2021-05-06 ENCOUNTER — Telehealth: Payer: Self-pay | Admitting: Urology

## 2021-05-06 DIAGNOSIS — Z743 Need for continuous supervision: Secondary | ICD-10-CM | POA: Diagnosis not present

## 2021-05-06 DIAGNOSIS — T83098A Other mechanical complication of other indwelling urethral catheter, initial encounter: Secondary | ICD-10-CM | POA: Insufficient documentation

## 2021-05-06 DIAGNOSIS — X58XXXA Exposure to other specified factors, initial encounter: Secondary | ICD-10-CM | POA: Diagnosis not present

## 2021-05-06 DIAGNOSIS — R310 Gross hematuria: Secondary | ICD-10-CM | POA: Diagnosis not present

## 2021-05-06 DIAGNOSIS — R58 Hemorrhage, not elsewhere classified: Secondary | ICD-10-CM | POA: Diagnosis not present

## 2021-05-06 DIAGNOSIS — R319 Hematuria, unspecified: Secondary | ICD-10-CM | POA: Insufficient documentation

## 2021-05-06 NOTE — ED Triage Notes (Signed)
Pt brought in from home by [facility]. Pt states father noticed blood in her foley tubing. No blood noted on arrival, only sediment.

## 2021-05-06 NOTE — Telephone Encounter (Signed)
Patient never responded to [facility] for Urodynamics.

## 2021-05-07 DIAGNOSIS — R279 Unspecified lack of coordination: Secondary | ICD-10-CM | POA: Diagnosis not present

## 2021-05-07 DIAGNOSIS — R5381 Other malaise: Secondary | ICD-10-CM | POA: Diagnosis not present

## 2021-05-07 DIAGNOSIS — Z743 Need for continuous supervision: Secondary | ICD-10-CM | POA: Diagnosis not present

## 2021-05-07 LAB — URINALYSIS, ROUTINE W REFLEX MICROSCOPIC
Bilirubin Urine: NEGATIVE
Glucose, UA: NEGATIVE mg/dL
Ketones, ur: NEGATIVE mg/dL
Nitrite: NEGATIVE
Protein, ur: NEGATIVE mg/dL
Specific Gravity, Urine: 1.01 (ref 1.005–1.030)
WBC, UA: >50 WBC/hpf — ABNORMAL HIGH (ref 0–5)
pH: 7 (ref 5.0–8.0)

## 2021-05-07 NOTE — ED Provider Notes (Signed)
[facility] Provider Note   [name]: [id number] Arrival date & time: 05/06/21  2313     History  Chief Complaint  Patient presents with   Foley Problem    Shalice Haines is a 31 y.o. female.  HPI    This is a 31 year old female with a history of MS and recent chronic indwelling Foley catheter who presents with concern for hematuria.  For EMS, father became concerned when he noted blood in the catheter.  Patient is asymptomatic.  States that she had her catheter changed last in December.  She has not had any fevers.  No back pain.  No nausea or vomiting.   Home Medications Prior to Admission medications   Medication Sig Start Date End Date Taking? Authorizing Provider  albuterol (VENTOLIN HFA) 108 (90 Base) MCG/ACT inhaler Inhale 1-2 puffs into the lungs every 6 (six) hours as needed for wheezing or shortness of breath. 02/23/21   Long, Wonda Olds, MD  fluticasone (FLONASE) 50 MCG/ACT nasal spray Place 2 sprays into both nostrils daily. 02/23/21   Long, Wonda Olds, MD  ondansetron (ZOFRAN ODT) 4 MG disintegrating tablet Take 1 tablet (4 mg total) by mouth every 8 (eight) hours as needed for nausea or vomiting. 02/23/21   Long, Wonda Olds, MD  polyethylene glycol powder (GLYCOLAX/MIRALAX) 17 GM/SCOOP powder SMARTSIG:1 Capful(s) By Mouth Daily 04/09/21   [provider]  Vitamin D, Ergocalciferol, (DRISDOL) 1.25 MG (50000 UNIT) CAPS capsule TAKE 1 CAPSULE BY MOUTH WEEKLY. 01/17/20   Sater, Nanine Means, MD      Allergies    Patient has no known allergies.    Review of Systems   Review of Systems  Constitutional:  Negative for fever.  Genitourinary:  Positive for hematuria.  All other systems reviewed and are negative.  Physical Exam Updated Vital Signs BP 99/72    Pulse 88    Temp 98.3 F (36.8 C) (Oral)    Resp 13    Ht 1.6 m (5\' 3" )    Wt 83.9 kg    SpO2 100%    BMI 32.77 kg/m  Physical Exam Vitals and nursing note reviewed.  Constitutional:       Appearance: She is well-developed.     Comments: Chronically ill appearing but nontoxic  HENT:     Head: Normocephalic and atraumatic.     Nose: Nose normal.     Mouth/Throat:     Mouth: Mucous membranes are moist.  Eyes:     Pupils: Pupils are equal, round, and reactive to light.  Cardiovascular:     Rate and Rhythm: Normal rate and regular rhythm.  Pulmonary:     Effort: Pulmonary effort is normal. No respiratory distress.  Abdominal:     Palpations: Abdomen is soft.     Tenderness: There is no abdominal tenderness.  Genitourinary:    Comments: Foley catheter with dark urine noted in the Foley bag, no obvious gross blood or clot Musculoskeletal:     Cervical back: Neck supple.  Skin:    General: Skin is warm and dry.  Neurological:     Mental Status: She is alert and oriented to person, place, and time.  Psychiatric:        Mood and Affect: Mood normal.    ED Results / Procedures / Treatments   Labs (all labs ordered are listed, but only abnormal results are displayed) Labs Reviewed  URINALYSIS, ROUTINE W REFLEX MICROSCOPIC - Abnormal; Notable for the following components:  Result Value   APPearance HAZY (*)    Hgb urine dipstick LARGE (*)    Leukocytes,Ua LARGE (*)    WBC, UA >50 (*)    Bacteria, UA MANY (*)    All other components within normal limits  URINE CULTURE    EKG None  Radiology No results found.  Procedures Procedures    Medications Ordered in ED Medications - No data to display  ED Course/ Medical Decision Making/ A&P                           Medical Decision Making Amount and/or Complexity of Data Reviewed Labs: ordered.   This patient presents to the ED for concern of hematuria, this involves an extensive number of treatment options, and is a complaint that carries with it a high risk of complications and morbidity.  The differential diagnosis includes Foley trauma, infection  MDM:    This is a 31 year old female who presents  with hematuria.  I do not note any obvious gross hematuria although her urine is dark in the Foley bag.  She has MS and likely some neurogenic bladder.  She is otherwise asymptomatic.  She is nontoxic.  Vital signs are reassuring.  Her physical exam is also benign at this time.  I did send a urinalysis.  It does have both urine and white cells in it with white blood cell clumps.  Given that her Foley has not been changed in over 1 month, will exchange.  I did send a urine culture but do not feel she needs treatment as she is chronically colonized from her indwelling Foley catheter.  She should follow-up with urology.  If she has any new or worsening symptoms such as fever, she should be reevaluated immediately. (Labs, imaging)  Labs: I Ordered, and personally interpreted labs.  The pertinent results include: Urinalysis  Imaging Studies ordered: I ordered imaging studies including none I independently visualized and interpreted imaging. I agree with the radiologist interpretation  Additional history obtained from chart review.  External records from outside source obtained and reviewed including urology record  Critical Interventions: Foley catheter exchange  Consultations: I requested consultation with the none,  and discussed lab and imaging findings as well as pertinent plan - they recommend: None  Cardiac Monitoring: The patient was maintained on a cardiac monitor.  I personally viewed and interpreted the cardiac monitored which showed an underlying rhythm of: Sinus rhythm  Reevaluation: After the interventions noted above, I reevaluated the patient and found that they have :stayed the same   Considered admission for:  n/a  Social Determinants of Health: Disabled secondary to [place]  Disposition: Discharge  Co morbidities that complicate the patient evaluation  Past Medical History:  Diagnosis Date   MS (multiple sclerosis) ([place])    No pertinent past medical history       Medicines No orders of the defined types were placed in this encounter.   I have reviewed the patients home medicines and have made adjustments as needed  Problem List / ED Course: Problem List Items Addressed This Visit   None Visit Diagnoses     Gross hematuria    -  Primary                   Final Clinical Impression(s) / ED Diagnoses Final diagnoses:  Gross hematuria    Rx / DC Orders ED Discharge Orders     None  Merryl Hacker, MD 05/07/21 [phone]

## 2021-05-07 NOTE — ED Notes (Signed)
Spoke to pt's father, and per father's request wants pt to be brought home via EMS since arrived to ED via EMS

## 2021-05-07 NOTE — ED Notes (Signed)
Spoke to pt's father and gave him an update

## 2021-05-07 NOTE — Discharge Instructions (Signed)
You were seen today with concerns for blood in your Foley catheter.  Your urinalysis does show blood and white cells.  Given that you are otherwise asymptomatic, a culture was sent.  Your Foley was replaced.  If you develop fevers or new or worsening symptoms, you should be reevaluated.

## 2021-05-07 NOTE — ED Notes (Signed)
Clamped foley line in attempt to draw a urine sample. Not enough urine at this time for a sample

## 2021-05-09 LAB — URINE CULTURE: Culture: 50000 — AB

## 2021-05-10 ENCOUNTER — Telehealth: Payer: Self-pay | Admitting: Emergency Medicine

## 2021-05-10 NOTE — Progress Notes (Signed)
ED Antimicrobial Stewardship Positive Culture Follow Up   Melissa West is an 31 y.o. female who presented to [facility] on 05/06/2021 with a chief complaint of  Chief Complaint  Patient presents with   Foley Problem    Recent Results (from the past 720 hour(s))  Urine Culture     Status: Abnormal   Collection Time: 05/07/21  2:29 AM   Specimen: Urine, Catheterized  Result Value Ref Range Status   Specimen Description   Final    URINE, CATHETERIZED Performed at [facility], [address] [place], [place] [address]    Special Requests   Final    NONE Performed at [facility], [address], [place],  [address]    Culture 50,000 COLONIES/mL ESCHERICHIA COLI (A)  Final   Report Status 05/09/2021 FINAL  Final   Organism ID, Bacteria ESCHERICHIA COLI (A)  Final      Susceptibility   Escherichia coli - MIC*    AMPICILLIN 16 INTERMEDIATE Intermediate     CEFAZOLIN <=4 SENSITIVE Sensitive     CEFEPIME <=0.12 SENSITIVE Sensitive     CEFTRIAXONE <=0.25 SENSITIVE Sensitive     CIPROFLOXACIN >=4 RESISTANT Resistant     GENTAMICIN <=1 SENSITIVE Sensitive     IMIPENEM <=0.25 SENSITIVE Sensitive     NITROFURANTOIN <=16 SENSITIVE Sensitive     TRIMETH/SULFA <=20 SENSITIVE Sensitive     AMPICILLIN/SULBACTAM 8 SENSITIVE Sensitive     PIP/TAZO <=4 SENSITIVE Sensitive     * 50,000 COLONIES/mL ESCHERICHIA COLI    [x]  Culture findings are likely not clinically significant given patient presentation and history. However, would recommend patient notify urology and follow-up with them this week for further recommendations. Provide return precautions to the patient.  ED Provider: Melina Copa MD   Lorelei Pont, PharmD, BCPS 05/10/2021 12:28 PM ED Clinical Pharmacist -  [phone]

## 2021-05-10 NOTE — Telephone Encounter (Signed)
Post ED Visit - Positive Culture Follow-up: Unsuccessful Patient Follow-up  Culture assessed and recommendations reviewed by:  []  , Pharm.D. []  Enzo Bi, Pharm.D., BCPS AQ-ID []  , Pharm.D., BCPS []  Celedonio Miyamoto, Pharm.D., BCPS []  [place], Garvin Fila.D., BCPS, AAHIVP []  , Pharm.D., BCPS, AAHIVP [x]  Georgina Pillion, PharmD []  , PharmD, BCPS  Positive urine culture  []  Patient discharged without antimicrobial prescription and treatment is now indicated []  Organism is resistant to prescribed ED discharge antimicrobial []  Patient with positive blood cultures   Unable to contact patient by phone, letter will be sent to address on file.  Plan:  Follow-up with urology.  [place], MD  [address] 05/10/2021, 2:34 PM

## 2021-05-13 ENCOUNTER — Encounter: Payer: Self-pay | Admitting: Neurology

## 2021-05-13 ENCOUNTER — Ambulatory Visit (INDEPENDENT_AMBULATORY_CARE_PROVIDER_SITE_OTHER): Payer: Medicare HMO | Admitting: Neurology

## 2021-05-13 VITALS — BP 96/62 | HR 86 | Ht 63.0 in | Wt 185.0 lb

## 2021-05-13 DIAGNOSIS — G35 Multiple sclerosis: Secondary | ICD-10-CM

## 2021-05-13 DIAGNOSIS — R29898 Other symptoms and signs involving the musculoskeletal system: Secondary | ICD-10-CM

## 2021-05-13 DIAGNOSIS — R799 Abnormal finding of blood chemistry, unspecified: Secondary | ICD-10-CM | POA: Diagnosis not present

## 2021-05-13 DIAGNOSIS — N3941 Urge incontinence: Secondary | ICD-10-CM | POA: Diagnosis not present

## 2021-05-13 DIAGNOSIS — Z79899 Other long term (current) drug therapy: Secondary | ICD-10-CM | POA: Diagnosis not present

## 2021-05-13 DIAGNOSIS — G35D Multiple sclerosis, unspecified: Secondary | ICD-10-CM

## 2021-05-13 DIAGNOSIS — H5500 Unspecified nystagmus: Secondary | ICD-10-CM

## 2021-05-13 DIAGNOSIS — R270 Ataxia, unspecified: Secondary | ICD-10-CM | POA: Diagnosis not present

## 2021-05-13 DIAGNOSIS — Z993 Dependence on wheelchair: Secondary | ICD-10-CM

## 2021-05-13 MED ORDER — BACLOFEN 10 MG PO TABS: ORAL_TABLET | ORAL | 11 refills | Status: DC

## 2021-05-13 NOTE — Progress Notes (Signed)
[facility]  PATIENT: Melissa West DOB: [birth year]  REFERRING DOCTOR OR PCP:  Allyn Kenner SOURCE: Patient, notes from primary care, notes from [facility] neurology, multiple MRI reports,[id number]  _________________________________   HISTORICAL  CHIEF COMPLAINT:  Chief Complaint  Patient presents with   Follow-up    Rm 1, w mother. Pt is here to f/u for her MS. Last seen in ov was 07/12/19 which at the time was off Tysabri. Pt would like to get back on a DMT for her MS. Pt had cathter placed around Nov. Pt was able to stand a few months ago and is now having to ambulate in [facility]. Has been having spacticity and shaking.     HISTORY OF PRESENT ILLNESS:   Melissa West is a 31 year old woman with relapsing remitting multiple sclerosis.     Update 05/13/2021 She had been on Tysabri in the past and felt she did best on it.  She had some access issues.   She went on [place] for a while but stopped and has been off a DMT she has appeared to progress some during that period of time and possibly has had additional activity versus worsening progression.  We discussed this further and she would like to switch back to  Tysabri.  However, traveling is still an issue for her so we discussed some other options including cassette and she was also interested in that possibility.  She has bilateral leg weakness and spasticity and is wheelchair bound.   The left leg gets phasic spasms easily.   Movements can increase spasticity.   She denies numbness.    She has a catheter now for the bladder and is seeing urology again soon.     She notes decreased vision much worse on right.    Colors are desaturated OD.  She has a dysconjugate gaze.  The ataxia makes it difficult for her to write.  The ataxia is worse on the right and fortunately she is left-handed.  She feels cogniton is the same.  Her mom feels she is more forgetful than she was previously.   MS history She was diagnosed with  MS around 2009..  At age 82, she had some falls due to poor balance not weakness.   A few months later, she had MRI's performed consistent with MS.  She was placed on Betaseron initially.    She did well for a while and was walking without an aid.  She returned to work.    She thinks she stopped the Betaseron and then had more symptoms including poor gait and diplopia.   She started Tysabri but stopped it during pregnancy.   She had a period of time (2013-2014) without any medications (she was still doing relatively well then).    She had more trouble with her right leg with weakness and spasticity and returned to [facility] (Dr. Shelia Media) in 2014.   She was placed back on Tysabri and stayed on until October 2016.   JCV Ab was 0.23 indeterminate 12/2014.    She then stopped the Tysabri and started to see Dr. Merlene Laughter.   She started Tecfidera but she thinks she was worsening.  In 2018, she had a positive JCV Ab titer (0.44) and a decision was made to swtich to [place].    About 2 years ago, she went on Ocrevus (thinks only 2 doses).  She hasn't seen any neurologist since and she feels she has progressed a lot.   She was  placed back on Tysabri in 2020 but stopped in 2021 due to poor access issues and difficulty with travel.  She then was on Mayzent but stopped that for an unknown reason in 2022.  Imaging:  CT scan of the head 07/02/2012 showed white matter lesions consistent with MS that were not present in 2009.  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  MRI of the lumbar spine 10/31/2012 showed demyelinating plaque in the lower thoracic spinal cord and conus medullaris.  MRI report 12/28/2013 of the brain (compared to 2011 )states: Interval disease progression indicated by increased number of innumerable T2 hyperintense lesions within the periventricular and subcortical white matter involving both cerebral hemispheres, with additional foci in the cerebellum and brainstem. No enhancing  lesions or restricted diffusion.  MRI cervical spine report 04/12/2013 (compared to 2011) shows: Extensive signal abnormality within the brainstem and cervical spinal cord in keeping with multiple sclerosis. There has been interval progression of disease within the brainstem and possibly at C3-4 within the spinal cord. No enhancement seen on today's exam.  MRI brain 02/02/2019 shows multiple T2/flair hyperintense foci in the cerebellum, brainstem and cerebral hemispheres in a pattern and configuration consistent with chronic demyelinating plaque associated with multiple sclerosis.  None of the foci enhances or appears to be acute. .   Absence of the septum pellucidum, reduced size of the optic nerves and reduced size of the corpus callosum.  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  T2 hyperintense foci scattered within the spinal cord at the cervicomedullary junction, and from C2 through C4 and C6-C7. Marland Kitchen   There were no significant degenerative changes  REVIEW OF SYSTEMS: Constitutional: No fevers, chills, sweats, or change in appetite.  She has excessive sleepiness.   Eyes: No visual changes, double vision, eye pain Ear, nose and throat: No hearing loss, ear pain, nasal congestion, sore throat Cardiovascular: No chest pain, palpitations Respiratory:  No shortness of breath at rest or with exertion.   She snores but reports that a sleep study did not show sleep apnea.   GastrointestinaI: No nausea, vomiting, diarrhea, abdominal pain, fecal incontinence Genitourinary:  No dysuria, urinary retention or frequency.  No nocturia. Musculoskeletal:  No neck pain, back pain Integumentary: No rash, pruritus, skin lesions Neurological: as above Psychiatric: No depression at this time.  No anxiety Endocrine: No palpitations, diaphoresis, change in appetite, change in weigh or increased  thirst Hematologic/Lymphatic:  No anemia, purpura, petechiae. Allergic/Immunologic: No itchy/runny eyes, nasal congestion, recent allergic reactions, rashes  ALLERGIES: No Known Allergies  HOME MEDICATIONS:  Current Outpatient Medications:    albuterol (VENTOLIN HFA) 108 (90 Base) MCG/ACT inhaler, Inhale 1-2 puffs into the lungs every 6 (six) hours as needed for wheezing or shortness of breath., Disp: 6.7 g, Rfl: 0   baclofen (LIORESAL) 10 MG tablet, Take 1/2 to 1 pill po tid, Disp: 90 each, Rfl: 11  PAST MEDICAL HISTORY: Past Medical History:  Diagnosis Date   MS (multiple sclerosis) ([place])    No pertinent past medical history     PAST SURGICAL HISTORY: Past Surgical History:  Procedure Laterality Date   CESAREAN SECTION  08/09/2011   Procedure: CESAREAN SECTION;  Surgeon: Guss Bunde, MD;  Location: [facility];  Service: Gynecology;  Laterality: N/A;    FAMILY HISTORY: Family History  Problem Relation Age of Onset   Diabetes Maternal Grandmother  Hypertension Maternal Grandmother    Kidney disease Maternal Grandmother     SOCIAL HISTORY:  Social History   Socioeconomic History   Marital status: Single    Spouse name: Not on file   Number of children: Not on file   Years of education: Not on file   Highest education level: Not on file  Occupational History   Not on file  Tobacco Use   Smoking status: Never   Smokeless tobacco: Never  Substance and Sexual Activity   Alcohol use: No   Drug use: No   Sexual activity: Yes    Birth control/protection: None  Other Topics Concern   Not on file  Social History Narrative   Left handed    Lives with kid   Caffeine use: none   Social Determinants of Health   Financial Resource Strain: Not on file  Food Insecurity: Not on file  Transportation Needs: Not on file  Physical Activity: Not on file  Stress: Not on file  Social Connections: Not on file  Intimate Partner Violence: Not on file     PHYSICAL  EXAM  Vitals:   05/13/21 1156  BP: 96/62  Pulse: 86  Weight: 185 lb (83.9 kg)  Height: 5\' 3"  (1.6 m)    Body mass index is 32.77 kg/m.   General: The patient is well-developed and well-nourished and in no acute distress  HEENT:  Head is [place]/AT.  Sclera are anicteric.    Skin: Extremities are without rash or  edema.  Musculoskeletal:  Back is nontender  Neurologic Exam  Mental status: The patient is alert and oriented x 3 at the time of the examination. The patient has apparent normal recent and remote memory, with an apparently normal attention span and concentration ability.   Speech is normal.  Cranial nerves:there is disconjugate gaze with left exotropia..  She has nystagmus with all gazes, worse on left gaze..   There is good facial sensation to soft touch bilaterally.Facial strength is normal.  Trapezius and sternocleidomastoid strength is normal.  She has mild dysarthria is noted.   No obvious hearing deficits are noted.  Motor:  Muscle bulk is normal.   Tone is increased in the legs.  Strength was fairly normal in the arms though rapid rotating movements were reduced.  Strength was 2- /5 in the legs.  Sensory: Sensory testing is intact to pinprick, soft touch and vibration sensation in the arms but reduced vibration in the legs.  Coordination: She has ataxic finger-nose-finger bilaterally, right worse than left.  She cannot do heel-to-shin.  Gait and station: She is wheelchair-bound and cannot stand even with strong support.     Reflexes: Deep tendon reflexes are increased.  In the legs, there is spread at the knees and clonus at the ankles.  Plantar responses are extensor.      ASSESSMENT AND PLAN  1. MS (multiple sclerosis) ([place])   2. High risk medication use   3. Ataxia   4. Urinary incontinence, urge   5. Leg weakness, bilateral   6. Wheelchair dependence   7. Nystagmus      1.   We discussed different treatment options.  She feels she did best on  Tysabri they will access issues and travel were issues.  We will check some lab work and she will choose between Tysabri and Kesimpta 2.    Continue other medications. 3.    rtc 4 months, sooner if new pr worsening issues  42-minute office visit with  the majority of the time spent face-to-face for history and physical, discussion/counseling and decision-making.  Additional time with record review and documentation.   Ponce Skillman A. Felecia Shelling, MD, [facility] [id number], [id number] PM Certified in Neurology, Clinical Neurophysiology, Sleep Medicine and Neuroimaging  [facility] [address] [place], [place] [address] [phone]

## 2021-05-14 ENCOUNTER — Inpatient Hospital Stay (HOSPITAL_COMMUNITY): Payer: Medicare HMO

## 2021-05-14 ENCOUNTER — Other Ambulatory Visit: Payer: Self-pay

## 2021-05-14 ENCOUNTER — Emergency Department (HOSPITAL_COMMUNITY): Payer: Medicare HMO

## 2021-05-14 ENCOUNTER — Telehealth: Payer: Self-pay | Admitting: Neurology

## 2021-05-14 ENCOUNTER — Inpatient Hospital Stay (HOSPITAL_COMMUNITY)
Admission: EM | Admit: 2021-05-14 | Discharge: 2021-05-20 | DRG: 698 | Disposition: A | Discharge status: Home Health | Payer: Medicare HMO | Attending: Family Medicine | Admitting: Family Medicine

## 2021-05-14 ENCOUNTER — Encounter (HOSPITAL_COMMUNITY): Payer: Self-pay | Admitting: Pharmacy Technician

## 2021-05-14 DIAGNOSIS — M542 Cervicalgia: Secondary | ICD-10-CM | POA: Diagnosis not present

## 2021-05-14 DIAGNOSIS — R6520 Severe sepsis without septic shock: Secondary | ICD-10-CM | POA: Diagnosis not present

## 2021-05-14 DIAGNOSIS — R7881 Bacteremia: Secondary | ICD-10-CM | POA: Diagnosis present

## 2021-05-14 DIAGNOSIS — Z743 Need for continuous supervision: Secondary | ICD-10-CM | POA: Diagnosis not present

## 2021-05-14 DIAGNOSIS — N390 Urinary tract infection, site not specified: Secondary | ICD-10-CM

## 2021-05-14 DIAGNOSIS — T83511A Infection and inflammatory reaction due to indwelling urethral catheter, initial encounter: Principal | ICD-10-CM | POA: Diagnosis present

## 2021-05-14 DIAGNOSIS — I9589 Other hypotension: Secondary | ICD-10-CM | POA: Diagnosis not present

## 2021-05-14 DIAGNOSIS — R9389 Abnormal findings on diagnostic imaging of other specified body structures: Secondary | ICD-10-CM | POA: Diagnosis present

## 2021-05-14 DIAGNOSIS — G35 Multiple sclerosis: Secondary | ICD-10-CM | POA: Diagnosis present

## 2021-05-14 DIAGNOSIS — I959 Hypotension, unspecified: Secondary | ICD-10-CM | POA: Diagnosis present

## 2021-05-14 DIAGNOSIS — Z20822 Contact with and (suspected) exposure to covid-19: Secondary | ICD-10-CM | POA: Diagnosis not present

## 2021-05-14 DIAGNOSIS — N289 Disorder of kidney and ureter, unspecified: Secondary | ICD-10-CM | POA: Diagnosis not present

## 2021-05-14 DIAGNOSIS — R6889 Other general symptoms and signs: Secondary | ICD-10-CM | POA: Diagnosis not present

## 2021-05-14 DIAGNOSIS — A4150 Gram-negative sepsis, unspecified: Secondary | ICD-10-CM | POA: Diagnosis not present

## 2021-05-14 DIAGNOSIS — D538 Other specified nutritional anemias: Secondary | ICD-10-CM | POA: Diagnosis not present

## 2021-05-14 DIAGNOSIS — Y846 Urinary catheterization as the cause of abnormal reaction of the patient, or of later complication, without mention of misadventure at the time of the procedure: Secondary | ICD-10-CM | POA: Diagnosis present

## 2021-05-14 DIAGNOSIS — R6521 Severe sepsis with septic shock: Secondary | ICD-10-CM | POA: Diagnosis present

## 2021-05-14 DIAGNOSIS — K7689 Other specified diseases of liver: Secondary | ICD-10-CM | POA: Diagnosis not present

## 2021-05-14 DIAGNOSIS — Z993 Dependence on wheelchair: Secondary | ICD-10-CM | POA: Diagnosis not present

## 2021-05-14 DIAGNOSIS — R531 Weakness: Secondary | ICD-10-CM | POA: Diagnosis not present

## 2021-05-14 DIAGNOSIS — R0789 Other chest pain: Secondary | ICD-10-CM | POA: Diagnosis not present

## 2021-05-14 DIAGNOSIS — R339 Retention of urine, unspecified: Secondary | ICD-10-CM | POA: Diagnosis not present

## 2021-05-14 DIAGNOSIS — N319 Neuromuscular dysfunction of bladder, unspecified: Secondary | ICD-10-CM | POA: Diagnosis present

## 2021-05-14 DIAGNOSIS — A4151 Sepsis due to Escherichia coli [E. coli]: Secondary | ICD-10-CM | POA: Diagnosis not present

## 2021-05-14 DIAGNOSIS — L8990 Pressure ulcer of unspecified site, unspecified stage: Secondary | ICD-10-CM | POA: Diagnosis present

## 2021-05-14 DIAGNOSIS — J90 Pleural effusion, not elsewhere classified: Secondary | ICD-10-CM | POA: Diagnosis not present

## 2021-05-14 DIAGNOSIS — N12 Tubulo-interstitial nephritis, not specified as acute or chronic: Secondary | ICD-10-CM | POA: Diagnosis present

## 2021-05-14 DIAGNOSIS — G4489 Other headache syndrome: Secondary | ICD-10-CM | POA: Diagnosis not present

## 2021-05-14 DIAGNOSIS — N2889 Other specified disorders of kidney and ureter: Secondary | ICD-10-CM | POA: Diagnosis not present

## 2021-05-14 DIAGNOSIS — R1900 Intra-abdominal and pelvic swelling, mass and lump, unspecified site: Secondary | ICD-10-CM | POA: Diagnosis not present

## 2021-05-14 DIAGNOSIS — I499 Cardiac arrhythmia, unspecified: Secondary | ICD-10-CM | POA: Diagnosis not present

## 2021-05-14 DIAGNOSIS — R338 Other retention of urine: Secondary | ICD-10-CM | POA: Diagnosis not present

## 2021-05-14 DIAGNOSIS — A419 Sepsis, unspecified organism: Secondary | ICD-10-CM | POA: Diagnosis not present

## 2021-05-14 DIAGNOSIS — D72829 Elevated white blood cell count, unspecified: Secondary | ICD-10-CM | POA: Diagnosis present

## 2021-05-14 DIAGNOSIS — E872 Acidosis, unspecified: Secondary | ICD-10-CM | POA: Diagnosis not present

## 2021-05-14 DIAGNOSIS — K769 Liver disease, unspecified: Secondary | ICD-10-CM | POA: Diagnosis not present

## 2021-05-14 DIAGNOSIS — R519 Headache, unspecified: Secondary | ICD-10-CM | POA: Diagnosis not present

## 2021-05-14 DIAGNOSIS — E861 Hypovolemia: Secondary | ICD-10-CM | POA: Diagnosis not present

## 2021-05-14 DIAGNOSIS — R079 Chest pain, unspecified: Secondary | ICD-10-CM | POA: Diagnosis not present

## 2021-05-14 DIAGNOSIS — D649 Anemia, unspecified: Secondary | ICD-10-CM

## 2021-05-14 DIAGNOSIS — R000 Tachycardia, unspecified: Secondary | ICD-10-CM | POA: Diagnosis not present

## 2021-05-14 DIAGNOSIS — K8020 Calculus of gallbladder without cholecystitis without obstruction: Secondary | ICD-10-CM | POA: Diagnosis not present

## 2021-05-14 DIAGNOSIS — N179 Acute kidney failure, unspecified: Secondary | ICD-10-CM | POA: Diagnosis present

## 2021-05-14 LAB — PROCALCITONIN: Procalcitonin: 7.6 ng/mL

## 2021-05-14 LAB — CBC WITH DIFFERENTIAL/PLATELET
Band Neutrophils: 9 %
Basophils Relative: 0 %
Eosinophils Absolute: 0 10*3/uL (ref 0.0–0.5)
Eosinophils Relative: 0 %
HCT: 39.2 % (ref 36.0–46.0)
Hemoglobin: 13.3 g/dL (ref 12.0–15.0)
Lymphocytes Relative: 3 %
MCH: 30.2 pg (ref 26.0–34.0)
MCHC: 33.9 g/dL (ref 30.0–36.0)
MCV: 89.1 fL (ref 80.0–100.0)
Monocytes Relative: 5 %
Neutrophils Relative %: 89 %
Platelets: 231 10*3/uL (ref 150–400)
RBC: 4.4 MIL/uL (ref 3.87–5.11)
RDW: 14.6 % (ref 11.5–15.5)
WBC: 47.7 10*3/uL — ABNORMAL HIGH (ref 4.0–10.5)
nRBC: 0 % (ref 0.0–0.2)

## 2021-05-14 LAB — URINALYSIS, ROUTINE W REFLEX MICROSCOPIC
Bilirubin Urine: NEGATIVE
Glucose, UA: NEGATIVE mg/dL
Ketones, ur: NEGATIVE mg/dL
Nitrite: NEGATIVE
Specific Gravity, Urine: 1.01 (ref 1.005–1.030)
pH: 8.5 — ABNORMAL HIGH (ref 5.0–8.0)

## 2021-05-14 LAB — PROTIME-INR
INR: 1.3 — ABNORMAL HIGH (ref 0.8–1.2)
Prothrombin Time: 15.9 seconds — ABNORMAL HIGH (ref 11.4–15.2)

## 2021-05-14 LAB — COMPREHENSIVE METABOLIC PANEL
ALT: 11 U/L (ref 0–44)
AST: 18 U/L (ref 15–41)
Albumin: 3.7 g/dL (ref 3.5–5.0)
Alkaline Phosphatase: 59 U/L (ref 38–126)
Anion gap: 9 (ref 5–15)
BUN: 32 mg/dL — ABNORMAL HIGH (ref 6–20)
CO2: 21 mmol/L — ABNORMAL LOW (ref 22–32)
Calcium: 9.2 mg/dL (ref 8.9–10.3)
Chloride: 104 mmol/L (ref 98–111)
Creatinine, Ser: 1.48 mg/dL — ABNORMAL HIGH (ref 0.44–1.00)
GFR, Estimated: 48 mL/min — ABNORMAL LOW (ref >60)
Glucose, Bld: 123 mg/dL — ABNORMAL HIGH (ref 70–99)
Potassium: 4.4 mmol/L (ref 3.5–5.1)
Sodium: 134 mmol/L — ABNORMAL LOW (ref 135–145)
Total Bilirubin: 0.7 mg/dL (ref 0.3–1.2)
Total Protein: 8.2 g/dL — ABNORMAL HIGH (ref 6.5–8.1)

## 2021-05-14 LAB — LACTIC ACID, PLASMA
Lactic Acid, Venous: 1.9 mmol/L (ref 0.5–1.9)
Lactic Acid, Venous: 3 mmol/L (ref 0.5–1.9)
Lactic Acid, Venous: 6.3 mmol/L (ref 0.5–1.9)
Lactic Acid, Venous: 3.1 mmol/L (ref 0.5–1.9)

## 2021-05-14 LAB — POC URINE PREG, ED: Preg Test, Ur: NEGATIVE

## 2021-05-14 LAB — RESP PANEL BY RT-PCR (FLU A&B, COVID) ARPGX2
Influenza A by PCR: NEGATIVE
Influenza B by PCR: NEGATIVE
SARS Coronavirus 2 by RT PCR: NEGATIVE

## 2021-05-14 LAB — URINALYSIS, MICROSCOPIC (REFLEX): WBC, UA: >50 WBC/hpf (ref 0–5)

## 2021-05-14 LAB — APTT: aPTT: 26 seconds (ref 24–36)

## 2021-05-14 MED ORDER — ENOXAPARIN SODIUM 40 MG/0.4ML IJ SOSY
40.0000 mg | PREFILLED_SYRINGE | INTRAMUSCULAR | Status: DC
  Administered 2021-05-15 – 2021-05-20 (×6): 40 mg via SUBCUTANEOUS
  Filled 2021-05-14 (×6): qty 0.4

## 2021-05-14 MED ORDER — LACTATED RINGERS IV BOLUS
1000.0000 mL | Freq: Once | INTRAVENOUS | Status: AC
  Administered 2021-05-14: 1000 mL via INTRAVENOUS

## 2021-05-14 MED ORDER — LACTATED RINGERS IV BOLUS (SEPSIS)
1000.0000 mL | Freq: Once | INTRAVENOUS | Status: AC
  Administered 2021-05-14: 1000 mL via INTRAVENOUS

## 2021-05-14 MED ORDER — LACTATED RINGERS IV SOLN: INTRAVENOUS | Status: AC

## 2021-05-14 MED ORDER — LACTATED RINGERS IV BOLUS (SEPSIS)
500.0000 mL | Freq: Once | INTRAVENOUS | Status: AC
  Administered 2021-05-14: 500 mL via INTRAVENOUS

## 2021-05-14 MED ORDER — SODIUM CHLORIDE 0.9 % IV SOLN
2.0000 g | INTRAVENOUS | Status: DC
  Administered 2021-05-14: 2 g via INTRAVENOUS
  Filled 2021-05-14: qty 20

## 2021-05-14 MED ORDER — SODIUM CHLORIDE 0.9 % IV SOLN
2.0000 g | Freq: Two times a day (BID) | INTRAVENOUS | Status: DC
  Administered 2021-05-14: 2 g via INTRAVENOUS
  Filled 2021-05-14: qty 2

## 2021-05-14 MED ORDER — SODIUM CHLORIDE 0.9 % IV BOLUS
500.0000 mL | Freq: Once | INTRAVENOUS | Status: AC
Start: 2021-05-14 — End: 2021-05-14
  Administered 2021-05-14: 500 mL via INTRAVENOUS

## 2021-05-14 MED ORDER — ACETAMINOPHEN 650 MG RE SUPP: 650.0000 mg | Freq: Four times a day (QID) | RECTAL | Status: DC | PRN

## 2021-05-14 MED ORDER — ACETAMINOPHEN 325 MG PO TABS
650.0000 mg | ORAL_TABLET | Freq: Four times a day (QID) | ORAL | Status: DC | PRN
  Administered 2021-05-15 – 2021-05-19 (×8): 650 mg via ORAL
  Filled 2021-05-14 (×8): qty 2

## 2021-05-14 NOTE — Telephone Encounter (Signed)
[employer] pending uploaded notes on the portal  

## 2021-05-14 NOTE — Progress Notes (Signed)
Pharmacy Antibiotic Note  Melissa West is a 31 y.o. female admitted on 05/14/2021 with UTI.  Pharmacy has been consulted for cefepime dosing.  Plan: Cefepime 2000 mg IV every 12 hours. Monitor labs, c/s, and patient improvement.  Height: 5\' 3"  (160 cm) Weight: 81.6 kg (180 lb) IBW/kg (Calculated) : 52.4  Temp (24hrs), Avg:100.2 F (37.9 C), Min:100.2 F (37.9 C), Max:100.2 F (37.9 C)  Recent Labs  Lab 05/13/21 1306 05/14/21 1141 05/14/21 1425  WBC 6.9 47.7*  --   CREATININE 0.77 1.48*  --   LATICACIDVEN  --  1.9 3.0*    Estimated Creatinine Clearance: 55.2 mL/min (A) (by C-G formula based on SCr of 1.48 mg/dL (H)).    No Known Allergies  Antimicrobials this admission: Cefepime 2/2 >> CTX 2/2  Microbiology results: 2/2 BCx: pending 2/2 UCx: pending 1/26 Ucx: e. coli resistant to cipro   Thank you for allowing pharmacy to be a part of this patients care.  2/26 05/14/2021 4:53 PM

## 2021-05-14 NOTE — ED Notes (Signed)
ED Provider at bedside. 

## 2021-05-14 NOTE — ED Notes (Signed)
Patient transported to CT 

## 2021-05-14 NOTE — Telephone Encounter (Signed)
Melissa West Berkley Harvey: [id number] (exp. 05/14/21 to 06/13/21) order sent to [facility], they will reach out to the patient to schedule.

## 2021-05-14 NOTE — Sepsis Progress Note (Signed)
Elink tracking the Code Sepsis. 

## 2021-05-14 NOTE — Progress Notes (Signed)
eLink Physician-Brief Progress Note Patient Name: Melissa West DOB: [birth year] MRN: [id number]   Date of Service  05/14/2021  HPI/Events of Note  31 yo F with a history of multiple sclerosis, urinary bladder dysfunction requiring an in-dwelling Foley catheter, who is admitted with sepsis of urinary tract origin, and acute kidney injury.  eICU Interventions  New Patient Evaluation.        Migdalia Dk 05/14/2021, 7:24 PM

## 2021-05-14 NOTE — Sepsis Progress Note (Signed)
Notified bedside nurse of need to administer additional fluid bolus and to collect a repeat Lactate after the bolus is complete.

## 2021-05-14 NOTE — ED Notes (Signed)
1 set [facility] sent to the lab

## 2021-05-14 NOTE — H&P (Signed)
TRH H&P   Patient Demographics:    Melissa West, is a 31 y.o. female  MRN: [id number]   DOB - [birth year]  Admit Date - 05/14/2021  Outpatient Primary MD for the patient is Benita Stabile, MD  Referring MD/NP/PA: PA Idol  Patient coming from: home  Chief Complaint  Patient presents with   Headache      HPI:    Melissa West  is a 31 y.o. 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 -ED patient was noted to be hypotensive, with significant leukocytosis at 47, repeat lactic acid was elevated at 3, her UA was strongly positive, Foley catheter was exchanged in ED, she was started empirically on vancomycin and Zosyn, hypotension responded to fluid boluses, as well creatinine elevated at 1.47 [facility] hospitalist consulted to admit    Review of systems:    In addition to the HPI above,   A full 10 point Review of Systems was done, except as stated above, all other Review of Systems were negative.   With Past History of the following :    Past Medical History:  Diagnosis Date   MS (multiple sclerosis) (HCC)    No pertinent past medical history       Past Surgical History:  Procedure Laterality Date   CESAREAN SECTION  08/09/2011   Procedure: CESAREAN SECTION;  Surgeon: Lesly Dukes, MD;  Location: [facility];  Service: Gynecology;  Laterality: N/A;      Social History:     Social History   Tobacco Use   Smoking status: Never    Smokeless tobacco: Never  Substance Use Topics   Alcohol use: No     Lives -was at home by herself, but family visits her frequently to help take care of her  Mobility -wheelchair dependent     Family History :     Family History  Problem Relation Age of Onset   Diabetes Maternal Grandmother    Hypertension Maternal Grandmother    Kidney disease Maternal Grandmother       Home Medications:   Prior to Admission medications   Medication Sig Start Date End Date Taking? Authorizing Provider  albuterol (VENTOLIN HFA) 108 (90 Base) MCG/ACT inhaler Inhale 1-2 puffs into the lungs every 6 (six) hours as needed for wheezing or shortness of breath. 02/23/21  Yes Long, Arlyss Repress, MD  baclofen (LIORESAL) 10 MG tablet Take 1/2 to 1 pill po tid Patient not taking: Reported on 05/14/2021 05/13/21   Epimenio Foot, Pearletha Furl,  MD     Allergies:    No Known Allergies   Physical Exam:   Vitals  Blood pressure (!) 81/56, pulse (!) 106, temperature 100.2 F (37.9 C), temperature source Oral, resp. rate (!) 21, height 5\' 3"  (1.6 m), weight 81.6 kg, SpO2 100 %.   1. General frail female, laying in bed, no apparent distress  2. Normal affect and insight, Not Suicidal or Homicidal, Awake Alert, Oriented X 3.  3.  No nuchal rigidity, ALL C.Nerves Intact,  significant weakness in bilateral lower extremities due to MS  4. Ears and Eyes appear Normal, Conjunctivae clear, PERRLA. Moist Oral Mucosa.  5. Supple Neck, No JVD, No cervical lymphadenopathy appriciated, No Carotid Bruits.  6. Symmetrical Chest wall movement, Good air movement bilaterally, CTAB.  7.  Tachycardic, No Gallops, Rubs or Murmurs, No Parasternal Heave.  8. Positive Bowel Sounds, Abdomen Soft, no CVA tenderness, no organomegaly appriciated,No rebound -guarding or rigidity.  Foley catheter present .  9.  No Cyanosis, Normal Skin Turgor, No Skin Rash or Bruise.  10. joints appear normal , no effusions  11. No Palpable Lymph Nodes  in Neck or Axillae   Patient was examined with the presence of her RN at bedside Kristen.  Data Review:    CBC Recent Labs  Lab 05/13/21 1306 05/14/21 1141  WBC 6.9 47.7*  HGB 13.2 13.3  HCT 40.2 39.2  PLT 260 231  MCV 87 89.1  MCH 28.4 30.2  MCHC 32.8 33.9  RDW 13.6 14.6  LYMPHSABS 1.7  --   EOSABS 0.1 0.0  BASOSABS 0.0  --    ------------------------------------------------------------------------------------------------------------------  Chemistries  Recent Labs  Lab 05/13/21 1306 05/14/21 1141  NA 143 134*  K 4.3 4.4  CL 107* 104  CO2 18* 21*  GLUCOSE 78 123*  BUN 14 32*  CREATININE 0.77 1.48*  CALCIUM 9.7 9.2  AST 13 18  ALT 8 11  ALKPHOS 67 59  BILITOT <0.2 0.7   ------------------------------------------------------------------------------------------------------------------ estimated creatinine clearance is 55.2 mL/min (A) (by C-G formula based on SCr of 1.48 mg/dL (H)). ------------------------------------------------------------------------------------------------------------------ No results for input(s): TSH, T4TOTAL, T3FREE, THYROIDAB in the last 72 hours.  Invalid input(s): FREET3  Coagulation profile Recent Labs  Lab 05/14/21 1423  INR 1.3*   ------------------------------------------------------------------------------------------------------------------- No results for input(s): DDIMER in the last 72 hours. -------------------------------------------------------------------------------------------------------------------  Cardiac Enzymes No results for input(s): CKMB, TROPONINI, MYOGLOBIN in the last 168 hours.  Invalid input(s): CK ------------------------------------------------------------------------------------------------------------------ No results found for: BNP   ---------------------------------------------------------------------------------------------------------------  Urinalysis    Component Value Date/Time    COLORURINE YELLOW 05/14/2021 1430   APPEARANCEUR HAZY (A) 05/14/2021 1430   LABSPEC 1.010 05/14/2021 1430   PHURINE 8.5 (H) 05/14/2021 1430   GLUCOSEU NEGATIVE 05/14/2021 1430   HGBUR TRACE (A) 05/14/2021 1430   BILIRUBINUR NEGATIVE 05/14/2021 1430   KETONESUR NEGATIVE 05/14/2021 1430   PROTEINUR TRACE (A) 05/14/2021 1430   UROBILINOGEN 0.2 06/13/2009 1128   NITRITE NEGATIVE 05/14/2021 1430   LEUKOCYTESUR LARGE (A) 05/14/2021 1430    ----------------------------------------------------------------------------------------------------------------   Imaging Results:    CT Head Wo Contrast  Result Date: 05/14/2021 CLINICAL DATA:  Headache, new or worsening, neuro deficit (Age 67-49y) EXAM: CT HEAD WITHOUT CONTRAST TECHNIQUE: Contiguous axial images were obtained from the base of the skull through the vertex without intravenous contrast. RADIATION DOSE REDUCTION: This exam was performed according to the departmental dose-optimization program which includes automated exposure control, adjustment of the mA and/or kV according to patient size and/or use of  iterative reconstruction technique. COMPARISON:  2014 FINDINGS: Brain: There is no acute intracranial hemorrhage, mass effect, or edema. Gray-white differentiation is preserved. Patchy and confluent areas of low density are present in periventricular white matter compatible with history of multiple sclerosis. Burden has mildly increased in comparison to the remote study. There is no extra-axial fluid collection. Ventricles and sulci are stable in size and configuration. There is absence of the septum pellucidum. Vascular: No hyperdense vessel or unexpected calcification. Skull: Calvarium is unremarkable. Sinuses/Orbits: No acute finding. Other: None. IMPRESSION: No acute intracranial abnormality. White matter changes consistent with history of multiple sclerosis. Electronically Signed   By: Guadlupe Spanish M.D.   On: 05/14/2021 15:33   DG Chest Port 1  View  Result Date: 05/14/2021 CLINICAL DATA:  Headaches for 2 days. EXAM: PORTABLE CHEST 1 VIEW COMPARISON:  02/23/2021 FINDINGS: The heart size and mediastinal contours are within normal limits. Both lungs are clear. The visualized skeletal structures are unremarkable. IMPRESSION: No active disease. Electronically Signed   By: Elige Ko M.D.   On: 05/14/2021 14:36    My personal review of EKG: Rhythm sinus tachycardia, Rate  118 /min, QTc 428   Assessment & Plan:    Principal Problem:   Sepsis (HCC) Active Problems:   Sepsis secondary to UTI (HCC)   AKI (acute kidney injury) (HCC)   MS (multiple sclerosis) (HCC)   Wheelchair dependence   Sepsis secondary to UTI/foley related -Sepsis present on admission, febrile, leukocytosis, elevated lactic acid, hypotensive and tachycardic -Cultures, follow urine cultures -Foley catheter changed in ED -She did received IV fluids per sepsis protocol, continue with IV fluids -Empirically on vancomycin and Zosyn, will change to cefepime -she will be admitted to ICU -Blood pressure responded to initial bolus, may require pressors, will admit to ICU. -We will check CT renal protocol. -No meningeal signs  AKI -Due to volume depletion and sepsis, avoid nephrotoxic medications, avoid low blood pressure, continue with IV fluids.  MS -Denies any double vision, or any worsening of her weakness from her baseline. -She is following with Dr. Sater/neurology in [place]  Deconditioning/Wheelchair dependent -Secondary to MS, will consult PT/OT    DVT Prophylaxis Lovenox   AM Labs Ordered, also please review Full Orders  Family Communication: Admission, patients condition and plan of care including tests being ordered have been discussed with the patient and mother by phone who indicate understanding and agree with the plan and Code Status.  Code Status full  Likely DC to  back home  Condition GUARDED    Consults called: none    Admission  status: inpatient    Time spent in minutes : 70 minutes   Huey Bienenstock M.D on 05/14/2021 at 4:39 PM   [facility] - Office  [phone]

## 2021-05-14 NOTE — ED Triage Notes (Signed)
Pt bib ems from home with reports of headache X2 days. Pt tachycardic with EMS. Other VSS. No neuro deficits noted.

## 2021-05-14 NOTE — ED Provider Notes (Addendum)
[facility] EMERGENCY DEPARTMENT Provider Note   [name]: [id number] Arrival date & time: 05/14/21  1023     History  Chief Complaint  Patient presents with   Headache    Melissa West is a 31 y.o. female with a history of MS who is currently wheelchair-bound at baseline, history of urge incontinence who has had an indwelling Foley catheter since November, presenting for evaluation of a 2-day history of headache, chills, no known fever,  palpitations and generalized malaise.  She also notes that her her catheter has been draining very little urine, she denies dysuria or suprapubic pain or pressure.  When she woke this morning there was very little urine in the urine bag.  She did not notice this problem yesterday.  She has had no treatment prior to arrival.  The history is provided by the patient and medical records.      Home Medications Prior to Admission medications   Medication Sig Start Date End Date Taking? Authorizing Provider  albuterol (VENTOLIN HFA) 108 (90 Base) MCG/ACT inhaler Inhale 1-2 puffs into the lungs every 6 (six) hours as needed for wheezing or shortness of breath. 02/23/21  Yes Long, Arlyss Repress, MD  baclofen (LIORESAL) 10 MG tablet Take 1/2 to 1 pill po tid Patient not taking: Reported on 05/14/2021 05/13/21   Asa Lente, MD      Allergies    Patient has no known allergies.    Review of Systems   Review of Systems  Constitutional:  Positive for chills and fever.  HENT:  Negative for congestion and sore throat.   Eyes: Negative.   Respiratory:  Negative for chest tightness and shortness of breath.   Cardiovascular:  Negative for chest pain.  Gastrointestinal:  Negative for abdominal pain, nausea and vomiting.  Genitourinary:  Positive for difficulty urinating. Negative for vaginal discharge.  Musculoskeletal:  Negative for arthralgias, joint swelling and neck pain.  Skin: Negative.  Negative for rash and wound.  Neurological:  Positive for headaches.  Negative for dizziness, weakness, light-headedness and numbness.  Psychiatric/Behavioral: Negative.    All other systems reviewed and are negative.  Physical Exam Updated Vital Signs BP 98/74    Pulse (!) 104    Temp 100.2 F (37.9 C) (Oral)    Resp 14    Ht 5\' 3"  (1.6 m)    Wt 81.6 kg    SpO2 100%    BMI 31.89 kg/m  Physical Exam Vitals and nursing note reviewed.  Constitutional:      Appearance: She is well-developed.  HENT:     Head: Normocephalic and atraumatic.  Eyes:     Conjunctiva/sclera: Conjunctivae normal.  Cardiovascular:     Rate and Rhythm: Normal rate and regular rhythm.     Heart sounds: Normal heart sounds.  Pulmonary:     Effort: Pulmonary effort is normal.     Breath sounds: Normal breath sounds. No wheezing.  Abdominal:     General: Bowel sounds are normal.     Palpations: Abdomen is soft.     Tenderness: There is no abdominal tenderness.  Musculoskeletal:        General: Normal range of motion.     Cervical back: Normal range of motion.  Skin:    General: Skin is warm and dry.  Neurological:     Mental Status: She is alert.    ED Results / Procedures / Treatments   Labs (all labs ordered are listed, but only abnormal results are displayed) Labs  Reviewed  COMPREHENSIVE METABOLIC PANEL - Abnormal; Notable for the following components:      Result Value   Sodium 134 (*)    CO2 21 (*)    Glucose, Bld 123 (*)    BUN 32 (*)    Creatinine, Ser 1.48 (*)    Total Protein 8.2 (*)    GFR, Estimated 48 (*)    All other components within normal limits  CBC WITH DIFFERENTIAL/PLATELET - Abnormal; Notable for the following components:   WBC 47.7 (*)    All other components within normal limits  URINALYSIS, ROUTINE W REFLEX MICROSCOPIC - Abnormal; Notable for the following components:   APPearance HAZY (*)    pH 8.5 (*)    Hgb urine dipstick TRACE (*)    Protein, ur TRACE (*)    Leukocytes,Ua LARGE (*)    All other components within normal limits   PROTIME-INR - Abnormal; Notable for the following components:   Prothrombin Time 15.9 (*)    INR 1.3 (*)    All other components within normal limits  LACTIC ACID, PLASMA - Abnormal; Notable for the following components:   Lactic Acid, Venous 3.0 (*)    All other components within normal limits  URINALYSIS, MICROSCOPIC (REFLEX) - Abnormal; Notable for the following components:   Bacteria, UA MANY (*)    All other components within normal limits  RESP PANEL BY RT-PCR (FLU A&B, COVID) ARPGX2  CULTURE, BLOOD (ROUTINE X 2)  CULTURE, BLOOD (ROUTINE X 2)  URINE CULTURE  LACTIC ACID, PLASMA  APTT  PROCALCITONIN  POC URINE PREG, ED    EKG None ED ECG REPORT   Date: 05/14/2021  Rate: 118  Rhythm: sinus tachycardia  QRS Axis: normal  Intervals: normal  ST/T Wave abnormalities: normal  Conduction Disutrbances:none  Narrative Interpretation:   Old EKG Reviewed: changes noted rate change.  I have personally reviewed the EKG tracing and agree with the computerized printout as noted.  Radiology CT Head Wo Contrast  Result Date: 05/14/2021 CLINICAL DATA:  Headache, new or worsening, neuro deficit (Age 50-49y) EXAM: CT HEAD WITHOUT CONTRAST TECHNIQUE: Contiguous axial images were obtained from the base of the skull through the vertex without intravenous contrast. RADIATION DOSE REDUCTION: This exam was performed according to the departmental dose-optimization program which includes automated exposure control, adjustment of the mA and/or kV according to patient size and/or use of iterative reconstruction technique. COMPARISON:  2014 FINDINGS: Brain: There is no acute intracranial hemorrhage, mass effect, or edema. Gray-white differentiation is preserved. Patchy and confluent areas of low density are present in periventricular white matter compatible with history of multiple sclerosis. Burden has mildly increased in comparison to the remote study. There is no extra-axial fluid collection.  Ventricles and sulci are stable in size and configuration. There is absence of the septum pellucidum. Vascular: No hyperdense vessel or unexpected calcification. Skull: Calvarium is unremarkable. Sinuses/Orbits: No acute finding. Other: None. IMPRESSION: No acute intracranial abnormality. White matter changes consistent with history of multiple sclerosis. Electronically Signed   By: Guadlupe Spanish M.D.   On: 05/14/2021 15:33   DG Chest Port 1 View  Result Date: 05/14/2021 CLINICAL DATA:  Headaches for 2 days. EXAM: PORTABLE CHEST 1 VIEW COMPARISON:  02/23/2021 FINDINGS: The heart size and mediastinal contours are within normal limits. Both lungs are clear. The visualized skeletal structures are unremarkable. IMPRESSION: No active disease. Electronically Signed   By: Elige Ko M.D.   On: 05/14/2021 14:36    Procedures Procedures  Medications Ordered in ED Medications  lactated ringers infusion ( Intravenous New Bag/Given 05/14/21 1625)  cefTRIAXone (ROCEPHIN) 2 g in sodium chloride 0.9 % 100 mL IVPB (0 g Intravenous Stopped 05/14/21 1459)  enoxaparin (LOVENOX) injection 40 mg (has no administration in time range)  acetaminophen (TYLENOL) tablet 650 mg (has no administration in time range)    Or  acetaminophen (TYLENOL) suppository 650 mg (has no administration in time range)  lactated ringers bolus 1,000 mL (1,000 mLs Intravenous New Bag/Given 05/14/21 1425)    And  lactated ringers bolus 1,000 mL (0 mLs Intravenous Stopped 05/14/21 1633)    And  lactated ringers bolus 500 mL (0 mLs Intravenous Stopped 05/14/21 1633)  lactated ringers bolus 1,000 mL (1,000 mLs Intravenous New Bag/Given 05/14/21 1635)    ED Course/ Medical Decision Making/ A&P                           Medical Decision Making Patient with a history of MS, indwelling Foley catheter, presenting with a 2-day history of headache which is worsening, presenting with tachycardia, borderline hypotension and fever.  She had a blocked  Foley catheter, this was changed out, UA completed which confirms a UTI.  Labs obtained and ongoing vital signs with persistent hypotension, although MAP has remained above 60 suggesting severe sepsis.  She has been given lactated Ringer's 30 mL/kg bolus.  She had received 2 L of fluids, 500 cc remaining when her second lactate was elevated, at this time we added an additional liter of lactated Ringer's.  She had initially received an IV dose of Rocephin as I suspected her infection was of a urinary source.  After her lactate worsened we broadened her coverage including Zosyn and vancomycin.       Amount and/or Complexity of Data Reviewed Independent Historian: parent Labs: ordered.    Details: Labs obtained are significant for an elevated creatinine of 1.48, mildly elevated BUN is 32, possibly indicating some degree of dehydration, with renal insufficiency.  She has an extreme leukocytosis with a WBC count of 47.7.  Her initial lactic acid was normal range, however repeat lactate was 3.0. Radiology: ordered.    Details: Lungs are clear without evidence of infection.  CT imaging of her head was also obtained due to headache symptoms.  There are no acute intracranial abnormalities except for her white matter changes consistent with her MS. ECG/medicine tests: ordered.    Details: Sinus tachycardia. Discussion of management or test interpretation with external provider(s): Patient was discussed with Dr. Sherene Sires who did not feel she needed intensivist at this time but would need obvious admission to the hospitalist service.  Discussed with Dr. Randol Kern the hospitalist service who accepts patient for admission.  Risk Prescription drug management. Decision regarding hospitalization.   CRITICAL CARE Performed by: Burgess Amor Total critical care time: 50 minutes Critical care time was exclusive of separately billable procedures and treating other patients. Critical care was necessary to treat or  prevent imminent or life-threatening deterioration. Critical care was time spent personally by me on the following activities: development of treatment plan with patient and/or surrogate as well as nursing, discussions with consultants, evaluation of patient's response to treatment, examination of patient, obtaining history from patient or surrogate, ordering and performing treatments and interventions, ordering and review of laboratory studies, ordering and review of radiographic studies, pulse oximetry and re-evaluation of patient's condition.         Final Clinical Impression(s) /  ED Diagnoses Final diagnoses:  Sepsis with acute renal failure, due to unspecified organism, unspecified acute renal failure type, unspecified whether septic shock present [facility])  Urinary tract infection associated with indwelling urethral catheter, initial encounter [facility])    Rx / DC Orders ED Discharge Orders     None         Victoriano Lain 05/14/21 1646    Burgess Amor, PA-C 05/14/21 1648    Vanetta Mulders, MD 05/15/21 [id number]

## 2021-05-14 NOTE — ED Notes (Signed)
Date and time results received: 05/14/21 3:11 PM   Test: Lactic Critical Value: 3.0  Name of Provider Notified: Burgess Amor  Orders Received? Or Actions Taken?: see orders

## 2021-05-14 NOTE — ED Notes (Signed)
Attempted irrigation of foley catheter that was present PTA. This was unsuccessful. Dr. Deretha Emory made aware and gave order to replace foley catheter.

## 2021-05-14 NOTE — Sepsis Progress Note (Signed)
Notified provider of need to order fluid bolus and repeat the Lactate. Marland Kitchen

## 2021-05-14 NOTE — Sepsis Progress Note (Signed)
Notified bedside nurse of need to draw blood cultures.  

## 2021-05-15 ENCOUNTER — Encounter (HOSPITAL_COMMUNITY): Payer: Self-pay | Admitting: Internal Medicine

## 2021-05-15 DIAGNOSIS — A419 Sepsis, unspecified organism: Secondary | ICD-10-CM | POA: Diagnosis not present

## 2021-05-15 DIAGNOSIS — E861 Hypovolemia: Secondary | ICD-10-CM

## 2021-05-15 DIAGNOSIS — G35 Multiple sclerosis: Secondary | ICD-10-CM | POA: Diagnosis not present

## 2021-05-15 DIAGNOSIS — I9589 Other hypotension: Secondary | ICD-10-CM | POA: Diagnosis not present

## 2021-05-15 DIAGNOSIS — D72829 Elevated white blood cell count, unspecified: Secondary | ICD-10-CM | POA: Diagnosis not present

## 2021-05-15 DIAGNOSIS — N179 Acute kidney failure, unspecified: Secondary | ICD-10-CM | POA: Diagnosis not present

## 2021-05-15 DIAGNOSIS — A4150 Gram-negative sepsis, unspecified: Secondary | ICD-10-CM

## 2021-05-15 DIAGNOSIS — I959 Hypotension, unspecified: Secondary | ICD-10-CM | POA: Diagnosis present

## 2021-05-15 DIAGNOSIS — N390 Urinary tract infection, site not specified: Secondary | ICD-10-CM

## 2021-05-15 DIAGNOSIS — R7881 Bacteremia: Secondary | ICD-10-CM | POA: Diagnosis not present

## 2021-05-15 LAB — LACTIC ACID, PLASMA: Lactic Acid, Venous: 1 mmol/L (ref 0.5–1.9)

## 2021-05-15 LAB — PROTIME-INR
INR: 1.5 — ABNORMAL HIGH (ref 0.8–1.2)
Prothrombin Time: 17.9 seconds — ABNORMAL HIGH (ref 11.4–15.2)

## 2021-05-15 LAB — BLOOD CULTURE ID PANEL (REFLEXED) - BCID2

## 2021-05-15 LAB — BASIC METABOLIC PANEL
Anion gap: 8 (ref 5–15)
BUN: 18 mg/dL (ref 6–20)
CO2: 22 mmol/L (ref 22–32)
Calcium: 8.2 mg/dL — ABNORMAL LOW (ref 8.9–10.3)
Chloride: 107 mmol/L (ref 98–111)
Creatinine, Ser: 0.74 mg/dL (ref 0.44–1.00)
GFR, Estimated: >60 mL/min (ref >60)
Glucose, Bld: 106 mg/dL — ABNORMAL HIGH (ref 70–99)
Potassium: 3.4 mmol/L — ABNORMAL LOW (ref 3.5–5.1)
Sodium: 137 mmol/L (ref 135–145)

## 2021-05-15 LAB — PROCALCITONIN: Procalcitonin: 5.89 ng/mL

## 2021-05-15 LAB — CBC
HCT: 30.8 % — ABNORMAL LOW (ref 36.0–46.0)
Hemoglobin: 10.1 g/dL — ABNORMAL LOW (ref 12.0–15.0)
MCH: 29.4 pg (ref 26.0–34.0)
MCHC: 32.8 g/dL (ref 30.0–36.0)
MCV: 89.8 fL (ref 80.0–100.0)
Platelets: 163 10*3/uL (ref 150–400)
RBC: 3.43 MIL/uL — ABNORMAL LOW (ref 3.87–5.11)
RDW: 14.8 % (ref 11.5–15.5)
WBC: 25.1 10*3/uL — ABNORMAL HIGH (ref 4.0–10.5)
nRBC: 0 % (ref 0.0–0.2)

## 2021-05-15 LAB — MRSA NEXT GEN BY PCR, NASAL: MRSA by PCR Next Gen: NOT DETECTED

## 2021-05-15 MED ORDER — NOREPINEPHRINE 4 MG/250ML-% IV SOLN
2.0000 ug/min | INTRAVENOUS | Status: DC
  Administered 2021-05-15: 2 ug/min via INTRAVENOUS
  Filled 2021-05-15: qty 250

## 2021-05-15 MED ORDER — SODIUM CHLORIDE 0.9 % IV BOLUS
500.0000 mL | Freq: Once | INTRAVENOUS | Status: AC
  Administered 2021-05-15: 500 mL via INTRAVENOUS

## 2021-05-15 MED ORDER — SODIUM CHLORIDE 0.9 % IV SOLN: INTRAVENOUS | Status: DC

## 2021-05-15 MED ORDER — NOREPINEPHRINE 4 MG/250ML-% IV SOLN: 0.0000 ug/min | INTRAVENOUS | Status: DC

## 2021-05-15 MED ORDER — SODIUM CHLORIDE 0.9 % IV SOLN
250.0000 mL | INTRAVENOUS | Status: DC
  Administered 2021-05-15: 250 mL via INTRAVENOUS

## 2021-05-15 MED ORDER — LACTATED RINGERS IV BOLUS
1000.0000 mL | Freq: Once | INTRAVENOUS | Status: AC
  Administered 2021-05-15: 1000 mL via INTRAVENOUS

## 2021-05-15 MED ORDER — MIDODRINE HCL 5 MG PO TABS
5.0000 mg | ORAL_TABLET | Freq: Three times a day (TID) | ORAL | Status: DC
  Administered 2021-05-15 – 2021-05-16 (×3): 5 mg via ORAL
  Filled 2021-05-15 (×3): qty 1

## 2021-05-15 MED ORDER — CHLORHEXIDINE GLUCONATE CLOTH 2 % EX PADS
6.0000 | MEDICATED_PAD | Freq: Every day | CUTANEOUS | Status: DC
  Administered 2021-05-15 – 2021-05-20 (×6): 6 via TOPICAL

## 2021-05-15 MED ORDER — LACTATED RINGERS IV SOLN: INTRAVENOUS | Status: AC

## 2021-05-15 MED ORDER — KETOROLAC TROMETHAMINE 30 MG/ML IJ SOLN
30.0000 mg | Freq: Once | INTRAMUSCULAR | Status: AC
  Administered 2021-05-15: 30 mg via INTRAVENOUS
  Filled 2021-05-15: qty 1

## 2021-05-15 MED ORDER — FLORANEX PO PACK
1.0000 g | PACK | Freq: Three times a day (TID) | ORAL | Status: DC
  Administered 2021-05-15 – 2021-05-20 (×14): 1 g via ORAL
  Filled 2021-05-15 (×22): qty 1

## 2021-05-15 MED ORDER — SODIUM CHLORIDE 0.9 % IV SOLN
2.0000 g | Freq: Three times a day (TID) | INTRAVENOUS | Status: DC
  Administered 2021-05-15: 2 g via INTRAVENOUS
  Filled 2021-05-15: qty 2

## 2021-05-15 MED ORDER — SODIUM CHLORIDE 0.9 % IV SOLN
2.0000 g | INTRAVENOUS | Status: DC
  Administered 2021-05-15 – 2021-05-16 (×2): 2 g via INTRAVENOUS
  Filled 2021-05-15 (×2): qty 20

## 2021-05-15 MED ORDER — TRAMADOL HCL 50 MG PO TABS
50.0000 mg | ORAL_TABLET | Freq: Two times a day (BID) | ORAL | Status: DC | PRN
  Administered 2021-05-15 – 2021-05-16 (×2): 50 mg via ORAL
  Filled 2021-05-15 (×2): qty 1

## 2021-05-15 NOTE — Progress Notes (Signed)
PROGRESS NOTE    Patient: Melissa West                            PCP: Celene Squibb, MD                    DOB: 03/21/1991            DOA: 05/14/2021 WUJ:[id number]             DOS: 05/15/2021, 11:36 AM   LOS: 1 day   Date of Service: The patient was seen and examined on 05/15/2021  Subjective:   The patient was seen and examined this morning. Blood pressure is improved, successfully weaned off pressors this morning Awake alert following command not moving her lower extremities, Foley catheter noted, clear urine noted in Foley cath Still tachycardic heart rate 126 temp 99.8, RR 18 blood pressure 124/60 WBC improved to 25.1  Brief Narrative:    Per HPI:  Melissa West  is a 31 y.o. 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 -ED patient was noted to be hypotensive, with significant leukocytosis at 47, repeat lactic acid was elevated at 3, her UA was strongly positive, Foley catheter was exchanged in ED, she was started empirically on vancomycin and Zosyn, hypotension responded to fluid boluses, as well creatinine elevated at 1.47  ED: Blood pressure (!) 81/56, pulse (!) 106, temperature 100.2 F (37.9 C), temperature source Oral, resp. rate (!) 21, height 5' 3"  (1.6 m), weight 81.6 kg, SpO2 100 %. WBC: 47.7   BMP BUN 32 creatinine 1.48 UA: Negative nitrite, large leukocyte esterase, WBCs    Assessment & Plan:   Active Problems:   Sepsis secondary to UTI ([place])   AKI (acute kidney injury) ([place])   Hypotension   MS (multiple sclerosis) (HCC)    Leukocytosis   Wheelchair dependence   Acute urinary retention   Pressure injury of skin     Assessment and Plan: Sepsis secondary to UTI [facility]) - Patient met sepsis criteria on admission provided leukocytosis hypotensive lactic acidosis hypotensive and tachycardic -Patient was started on sepsis pathway -Initiated started aggressive IV fluid resuscitation with LR, broad-spectrum IV antibiotic vancomycin and Zosyn antibiotic was switched to cefepime -This morning still meeting sepsis criteria temp 99.8, heart rate 126, blood pressure as low as 81/56 improved 124/60 -WBC of 25.1,  -Lactic acid 6.3, 3.1, 1.0 this morning -Creatinine 1.48 >> 0.74 -Infectious disease Dr. Graylon Good from [facility] has been consulted-  appreciate their input  AKI (acute kidney injury) ([place])- (present on admission) - Likely due to sepsis, UTI, urinary retention -Foley catheter on initial presentation thought to be clogged, was replaced -Status post IV fluid resuscitation -Creatinine 1.47 >> improved to 0.74 this a.m.  Hypotension- (present on admission) - On admission patient was found hypotensive likely due to sepsis -Started on aggressive IV fluid resuscitation LR, and pressors overnight-this morning BP stabilized, pressors were weaned off -Monitoring BP, currently stable   Leukocytosis- (present on admission) - Due to sepsis, continue to improve, continue IV fluid and IV antibiotics  as above  MS (multiple sclerosis) ([place])- (present on admission) - Denies any double vision, denies any worsening weakness in her upper extremity based on lower extremity weakness at baseline, hemiplegic wheelchair dependent --She is following with Dr. Sater/neurology in [place] dependence - Due to [place] -  Pressure injury of skin- (present on admission) - Per nurses evaluation, continue nursing care, wound care  Acute urinary retention- (present on admission) - Due to [place], neurogenic bladder -Chronic  indwelling urine catheter, was changed in ER overnight 05/15/2021      Skin Assessment: I have examined the patient's skin and I agree with the wound assessment as performed by wound care team As outlined belowe: Pressure Injury 03/08/21 Sacrum Medial Stage 2 -  Partial thickness loss of dermis presenting as a shallow open injury with a red, pink wound bed without slough. 1 by 1 stage 2 area (Active)  03/08/21 0913  Location: Sacrum  Location Orientation: Medial  Staging: Stage 2 -  Partial thickness loss of dermis presenting as a shallow open injury with a red, pink wound bed without slough.  Wound Description (Comments): 1 by 1 stage 2 area  Present on Admission: Yes     --------------------------------------------------------------------------------------------------------------------------- Cultures; Blood Cultures x 2 >> NGT Urine Culture  >>> NGT   ------------------------------------------------------------------------------------------------------------------------------------------------  DVT prophylaxis:  enoxaparin (LOVENOX) injection 40 mg Start: 05/15/21 1000   Code Status:   Code Status: Full Code  Family Communication: No family member present at bedside- attempt will be made to update daily The above findings and plan of care has been discussed with patient (and family)  in detail,  they expressed understanding and agreement of above. -Advance care planning has been discussed.   Admission status:   Status is: Inpatient Remains inpatient appropriate because: Requiring aggressive treatment for sepsis, IV fluid IV antibiotics,    Consult: Infectious disease at [facility] has been remotely consulted         Procedures:   No admission procedures for hospital encounter.   Antimicrobials:  Anti-infectives (From admission, onward)    Start     Dose/Rate Route Frequency Ordered Stop   05/15/21 1000  ceFEPIme (MAXIPIME) 2 g in sodium chloride 0.9 % 100  mL IVPB        2 g 200 mL/hr over 30 Minutes Intravenous Every 8 hours 05/15/21 0902     05/14/21 1800  ceFEPIme (MAXIPIME) 2 g in sodium chloride 0.9 % 100 mL IVPB  Status:  Discontinued        2 g 200 mL/hr over 30 Minutes Intravenous Every 12 hours 05/14/21 1652 05/15/21 0902   05/14/21 1415  cefTRIAXone (ROCEPHIN) 2 g in sodium chloride 0.9 % 100 mL IVPB  Status:  Discontinued        2 g 200 mL/hr over 30 Minutes Intravenous Every 24 hours 05/14/21 1407 05/14/21 1652        Medication:   Chlorhexidine Gluconate Cloth  6 each Topical Daily   enoxaparin (LOVENOX) injection  40 mg Subcutaneous Q24H   lactobacillus  1 g Oral TID WC    acetaminophen **OR** acetaminophen   Objective:   Vitals:   05/15/21 0630 05/15/21 0757 05/15/21 0800 05/15/21 0900  BP: 111/72  110/75 124/60  Pulse: (!) 116 (!) 124 (!) 121 (!) 126  Resp: 18 16 17 18   Temp:  99.8 F (37.7 C)    TempSrc:  Oral    SpO2: 100% 100% 100% 100%  Weight:  Height:        Intake/Output Summary (Last 24 hours) at 05/15/2021 1136 Last data filed at 05/15/2021 0500 Gross per 24 hour  Intake 3700.52 ml  Output 3821 ml  Net -120.48 ml   Filed Weights   05/14/21 1030 05/15/21 0500  Weight: 81.6 kg 65.7 kg     Examination:   Physical Exam  Constitution:  Alert, cooperative, no distress,  Appears calm and comfortable  Psychiatric:   Normal and stable mood and affect, cognition intact,   HEENT:        Normocephalic, PERRL, otherwise with in Normal limits  Chest:         Chest symmetric Cardio vascular:  S1/S2, RRR, No murmure, No Rubs or Gallops  pulmonary: Clear to auscultation bilaterally, respirations unlabored, negative wheezes / crackles Abdomen: Soft, non-tender, non-distended, bowel sounds,no masses, no organomegaly Muscular skeletal: Limited exam - in bed, paraplegic mildly contracted bilateral lower extremities, with significantly reduced sensation and motor Upper extremity motor and sensation  intact Neuro: CNII-XII intact. ,  Paraplegic Extremities: Mild contraction of lower extremities bilaterally, +1 pitting edema lower extremities, +2 pulses  Skin: Dry, warm to touch, negative for any Rashes,  Chronic indwelling catheter-exchange in ED 05/15/2021 Wounds: Pressure Injury 03/08/21 Sacrum Medial Stage 2 -  Partial thickness loss of dermis presenting as a shallow open injury with a red, pink wound bed without slough. 1 by 1 stage 2 area (Active)  03/08/21 0913  Location: Sacrum  Location Orientation: Medial  Staging: Stage 2 -  Partial thickness loss of dermis presenting as a shallow open injury with a red, pink wound bed without slough.  Wound Description (Comments): 1 by 1 stage 2 area  Present on Admission: Yes    ------------------------------------------------------------------------------------------------------------------------------------------    LABs:  CBC Latest Ref Rng & Units 05/15/2021 05/14/2021 05/13/2021  WBC 4.0 - 10.5 K/uL 25.1(H) 47.7(H) 6.9  Hemoglobin 12.0 - 15.0 g/dL 10.1(L) 13.3 13.2  Hematocrit 36.0 - 46.0 % 30.8(L) 39.2 40.2  Platelets 150 - 400 K/uL 163 231 260   CMP Latest Ref Rng & Units 05/15/2021 05/14/2021 05/13/2021  Glucose 70 - 99 mg/dL 106(H) 123(H) 78  BUN 6 - 20 mg/dL 18 32(H) 14  Creatinine 0.44 - 1.00 mg/dL 0.74 1.48(H) 0.77  Sodium 135 - 145 mmol/L 137 134(L) 143  Potassium 3.5 - 5.1 mmol/L 3.4(L) 4.4 4.3  Chloride 98 - 111 mmol/L 107 104 107(H)  CO2 22 - 32 mmol/L 22 21(L) 18(L)  Calcium 8.9 - 10.3 mg/dL 8.2(L) 9.2 9.7  Total Protein 6.5 - 8.1 g/dL - 8.2(H) 7.8  Total Bilirubin 0.3 - 1.2 mg/dL - 0.7 <0.2  Alkaline Phos 38 - 126 U/L - 59 67  AST 15 - 41 U/L - 18 13  ALT 0 - 44 U/L - 11 8       Micro Results Pending cultures:  urine cultures, blood cultures Influenza A/B/SARS-CoV-2 all negative   Radiology Reports CT Head Wo Contrast  Result Date: 05/14/2021 CLINICAL DATA:  Headache, new or worsening, neuro deficit (Age 50-49y)  EXAM: CT HEAD WITHOUT CONTRAST TECHNIQUE: Contiguous axial images were obtained from the base of the skull through the vertex without intravenous contrast. RADIATION DOSE REDUCTION: This exam was performed according to the departmental dose-optimization program which includes automated exposure control, adjustment of the mA and/or kV according to patient size and/or use of iterative reconstruction technique. COMPARISON:  2014 FINDINGS: Brain: There is no acute intracranial hemorrhage, mass effect, or edema. Gray-white differentiation is  preserved. Patchy and confluent areas of low density are present in periventricular white matter compatible with history of multiple sclerosis. Burden has mildly increased in comparison to the remote study. There is no extra-axial fluid collection. Ventricles and sulci are stable in size and configuration. There is absence of the septum pellucidum. Vascular: No hyperdense vessel or unexpected calcification. Skull: Calvarium is unremarkable. Sinuses/Orbits: No acute finding. Other: None. IMPRESSION: No acute intracranial abnormality. White matter changes consistent with history of multiple sclerosis. Electronically Signed   By: Macy Mis M.D.   On: 05/14/2021 15:33   DG Chest Port 1 View  Result Date: 05/14/2021 CLINICAL DATA:  Headaches for 2 days. EXAM: PORTABLE CHEST 1 VIEW COMPARISON:  02/23/2021 FINDINGS: The heart size and mediastinal contours are within normal limits. Both lungs are clear. The visualized skeletal structures are unremarkable. IMPRESSION: No active disease. Electronically Signed   By: Kathreen Devoid M.D.   On: 05/14/2021 14:36   CT RENAL STONE STUDY  Result Date: 05/14/2021 CLINICAL DATA:  Sepsis, urinary retention, indwelling Foley catheter, generalized weakness and chills EXAM: CT ABDOMEN AND PELVIS WITHOUT CONTRAST TECHNIQUE: Multidetector CT imaging of the abdomen and pelvis was performed following the standard protocol without IV contrast. RADIATION  DOSE REDUCTION: This exam was performed according to the departmental dose-optimization program which includes automated exposure control, adjustment of the mA and/or kV according to patient size and/or use of iterative reconstruction technique. COMPARISON:  03/07/2021 FINDINGS: Lower chest: No acute pleural or parenchymal lung disease. Hepatobiliary: Calcified gallstones without evidence of cholecystitis. Unremarkable unenhanced appearance of the liver. Pancreas: Unremarkable unenhanced appearance. Spleen: Unremarkable unenhanced appearance. Adrenals/Urinary Tract: Bladder is decompressed with a Foley catheter. No urinary tract calculi or obstructive uropathy within either kidney. Evaluation of the renal parenchyma is limited without IV contrast. The adrenals are stable. Stomach/Bowel: No bowel obstruction or ileus. Normal appendix right lower quadrant. No bowel wall thickening or inflammatory change. Vascular/Lymphatic: No significant vascular findings are present. No enlarged abdominal or pelvic lymph nodes. Reproductive: Uterus and bilateral adnexa are unremarkable. Other: Trace free fluid in the pelvis may be physiologic. No free intraperitoneal gas. No abdominal wall hernia. Musculoskeletal: No acute or destructive bony lesions. Reconstructed images demonstrate no additional findings. IMPRESSION: 1. Cholelithiasis without cholecystitis. 2. No evidence of urinary tract calculi or obstructive uropathy. Indwelling Foley catheter decompresses the bladder. 3. Trace pelvic free fluid, likely physiologic. Electronically Signed   By: Randa Ngo M.D.   On: 05/14/2021 17:14    SIGNED: Deatra James, MD, FHM. [facility],  Pager (please use [email or web address] to page/text) Please use Epic Secure Chat for non-urgent communication (7AM-7PM)  > 66 minutes of critical care time was spent in evaluating the patient, reviewing all medical records, electronic records, discussing plan of care with the patient ICU  nursing staff ID consult --stabilizing the patient in ICU setting  if 7PM-7AM, please contact night-coverage [email or web address], 05/15/2021, 11:36 AM

## 2021-05-15 NOTE — Assessment & Plan Note (Addendum)
-  Blood pressure remained soft -Off pressors-Levophed -Hypotensive likely related to autonomic system due to advanced MS Was exacerbated by sepsis -Continue midodrine

## 2021-05-15 NOTE — Assessment & Plan Note (Addendum)
-   Per nurses evaluation, continue nursing care, wound care -Continue nursing care, -Foley in situ for urinary retention and to help prevent skin breakdown

## 2021-05-15 NOTE — Assessment & Plan Note (Addendum)
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

## 2021-05-15 NOTE — Progress Notes (Signed)
PHARMACY - PHYSICIAN COMMUNICATION CRITICAL VALUE ALERT - BLOOD CULTURE IDENTIFICATION (BCID)  Melissa West is an 31 y.o. female who presented to [facility] on 05/14/2021 with a chief complaint of headache  Assessment:  31 y.o. female, with past medical history of MS, who is wheelchair dependent at baseline, with recent retention/urge incontinence for which she had chronic indwelling Foley catheter since November, she presenting to ED for evaluation for 2 days history of headache, chills, potation, generalized weakness and fatigue. BCID shows E.Coli with no resistance on Blood Cx.   Name of physician (or Provider) Contacted: Shahmehdi  Current antibiotics: cefepime  Changes to prescribed antibiotics recommended:  Recommendations accepted by provider Change abx to Ceftriaxone 2gm IV q24h  Results for orders placed or performed during the hospital encounter of 05/14/21  Blood Culture ID Panel (Reflexed) (Collected: 05/14/2021  2:09 PM)  Result Value Ref Range   Enterococcus faecalis NOT DETECTED NOT DETECTED   Enterococcus Faecium NOT DETECTED NOT DETECTED   Listeria monocytogenes NOT DETECTED NOT DETECTED   Staphylococcus species NOT DETECTED NOT DETECTED   Staphylococcus aureus (BCID) NOT DETECTED NOT DETECTED   Staphylococcus epidermidis NOT DETECTED NOT DETECTED   Staphylococcus lugdunensis NOT DETECTED NOT DETECTED   Streptococcus species NOT DETECTED NOT DETECTED   Streptococcus agalactiae NOT DETECTED NOT DETECTED   Streptococcus pneumoniae NOT DETECTED NOT DETECTED   Streptococcus pyogenes NOT DETECTED NOT DETECTED   A.calcoaceticus-baumannii NOT DETECTED NOT DETECTED   Bacteroides fragilis NOT DETECTED NOT DETECTED   Enterobacterales DETECTED (A) NOT DETECTED   Enterobacter cloacae complex NOT DETECTED NOT DETECTED   Escherichia coli DETECTED (A) NOT DETECTED   Klebsiella aerogenes NOT DETECTED NOT DETECTED   Klebsiella oxytoca NOT DETECTED NOT DETECTED   Klebsiella  pneumoniae NOT DETECTED NOT DETECTED   Proteus species NOT DETECTED NOT DETECTED   Salmonella species NOT DETECTED NOT DETECTED   Serratia marcescens NOT DETECTED NOT DETECTED   Haemophilus influenzae NOT DETECTED NOT DETECTED   Neisseria meningitidis NOT DETECTED NOT DETECTED   Pseudomonas aeruginosa NOT DETECTED NOT DETECTED   Stenotrophomonas maltophilia NOT DETECTED NOT DETECTED   Candida albicans NOT DETECTED NOT DETECTED   Candida auris NOT DETECTED NOT DETECTED   Candida glabrata NOT DETECTED NOT DETECTED   Candida krusei NOT DETECTED NOT DETECTED   Candida parapsilosis NOT DETECTED NOT DETECTED   Candida tropicalis NOT DETECTED NOT DETECTED   Cryptococcus neoformans/gattii NOT DETECTED NOT DETECTED   CTX-M ESBL NOT DETECTED NOT DETECTED   Carbapenem resistance IMP NOT DETECTED NOT DETECTED   Carbapenem resistance KPC NOT DETECTED NOT DETECTED   Carbapenem resistance NDM NOT DETECTED NOT DETECTED   Carbapenem resist OXA 48 LIKE NOT DETECTED NOT DETECTED   Carbapenem resistance VIM NOT DETECTED NOT DETECTED    Isac Sarna, BS Melissa West, BCPS Clinical Pharmacist Pager [phone] 05/15/2021  12:20 PM

## 2021-05-15 NOTE — Hospital Course (Signed)
°  Per HPI:  Melissa West  is a 31 y.o. 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 -ED patient was noted to be hypotensive, with significant leukocytosis at 47, repeat lactic acid was elevated at 3, her UA was strongly positive, Foley catheter was exchanged in ED, she was started empirically on vancomycin and Zosyn, hypotension responded to fluid boluses, as well creatinine elevated at 1.47  ED: Blood pressure (!) 81/56, pulse (!) 106, temperature 100.2 F (37.9 C), temperature source Oral, resp. rate (!) 21, height 5\' 3"  (1.6 m), weight 81.6 kg, SpO2 100 %. WBC: 47.7   BMP BUN 32 creatinine 1.48 UA: Negative nitrite, large leukocyte esterase, WBCs

## 2021-05-15 NOTE — Assessment & Plan Note (Addendum)
-   Due to sepsis, -Resolved

## 2021-05-15 NOTE — Assessment & Plan Note (Addendum)
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

## 2021-05-15 NOTE — Consult Note (Signed)
[facility] for Infectious Disease  Total days of antibiotics 2/cefepime  Reason for Consult:gram negative bacteremia-sepsis from urinary source   Referring Physician:shahmehdi  Active Problems:   MS (multiple sclerosis) ([place])   Wheelchair dependence   AKI (acute kidney injury) ([place])   Leukocytosis   Acute urinary retention   Pressure injury of skin   Sepsis secondary to UTI ([place])   Hypotension  This is a telehealth video consultation. Patient at [facility]. Consultant at [facility]  HPI: Melissa West is a 31 y.o. female with history of relapsing remitting MS off of medications due to access, wheelchair dependent and chronic foley dependent in the 2 days of prior to admission, she was noted to have little urine output in her foley collection bag per her mom and care attendant. She was admitted on 2/2 due to altered mental status, and chills. On admit, she had some lab abn including aki with cr of 1.48 down from 0.77. LA of 6.3. she had foley catheter replaced in the ED, and instantly drained 1L UOP. She then had urine cx sent from her new foley catheter. Infectious work up revealed + blood cx with e.coli on BCID. She was initially placed on ceftriaxone then vancomycin and cefepime. Ms. [place] mom also mentioned that she was never really educated on the management of her foley catheter. They often place it behind her bag when she is in the wheelchair. Did not know what to do or how to evaluate if there is low urine output. ID asked to weigh in on management In review to her records, she had recent urine cx from 05/07/21 that had e.coli 50K with amp R and FQ -R. Beside nurse reports patient improvement, good urine output.  Past Medical History:  Diagnosis Date   MS (multiple sclerosis) ([place])    No pertinent past medical history     Allergies: No Known Allergies   MEDICATIONS:  Chlorhexidine Gluconate Cloth  6 each Topical Daily   enoxaparin (LOVENOX) injection  40 mg Subcutaneous  Q24H   lactobacillus  1 g Oral TID WC    Social History   Tobacco Use   Smoking status: Never   Smokeless tobacco: Never  Substance Use Topics   Alcohol use: No   Drug use: No    Family History  Problem Relation Age of Onset   Diabetes Maternal Grandmother    Hypertension Maternal Grandmother    Kidney disease Maternal Grandmother     Review of Systems -   Constitutional: Negative for fever, chills, diaphoresis, activity change, appetite change, fatigue and unexpected weight change.  HENT: Negative for congestion, sore throat, rhinorrhea, sneezing, trouble swallowing and sinus pressure.  Eyes: Negative for photophobia and visual disturbance.  Respiratory: Negative for cough, chest tightness, shortness of breath, wheezing and stridor.  Cardiovascular: Negative for chest pain, palpitations and leg swelling.  Gastrointestinal: Negative for nausea, vomiting, abdominal pain, diarrhea, constipation, blood in stool, abdominal distention and anal bleeding.  Genitourinary: +decrease urine output. Negative for dysuria, hematuria, flank pain and difficulty urinating.  Musculoskeletal: Negative for myalgias, back pain, joint swelling, arthralgias and gait problem.  Skin: Negative for color change, pallor, rash and wound.  Neurological: +weakness and spasticity from MS. Negative for dizziness, tremors, weakness and light-headedness.  Hematological: Negative for adenopathy. Does not bruise/bleed easily.  Psychiatric/Behavioral: Negative for behavioral problems, confusion, sleep disturbance, dysphoric mood, decreased concentration and agitation.    OBJECTIVE: Temp:  [98.4 F (36.9 C)-100.3 F (37.9 C)] 99.8 F (37.7 C) (  02/03 0757) Pulse Rate:  [103-153] 126 (02/03 0900) Resp:  [14-24] 18 (02/03 0900) BP: (81-124)/(51-80) 124/60 (02/03 0900) SpO2:  [99 %-100 %] 100 % (02/03 0900) Weight:  [65.7 kg] 65.7 kg (02/03 0500) Physical Exam  Constitutional:  oriented to person, place, and  time. Intermittently alert. appears well-developed and well-nourished. No distress.  HENT: [place]/AT, PERRLA, no scleral icterus Mouth/Throat: Oropharynx is clear and moist. No oropharyngeal exudate.  Abdominal: Soft. Bowel sounds are normal.  exhibits no distension. Foley in place.  Neurological: moves arms independently. alert and oriented to person, place, and time.  Skin: did not see rash noted. No erythema.  Psychiatric: a normal mood and affect.  behavior is normal.    LABS: Results for orders placed or performed during the hospital encounter of 05/14/21 (from the past 48 hour(s))  Lactic acid, plasma     Status: None   Collection Time: 05/14/21 11:41 AM  Result Value Ref Range   Lactic Acid, Venous 1.9 0.5 - 1.9 mmol/L    Comment: Performed at [facility], [address], [place], [place] [address]  Comprehensive metabolic panel     Status: Abnormal   Collection Time: 05/14/21 11:41 AM  Result Value Ref Range   Sodium 134 (L) 135 - 145 mmol/L   Potassium 4.4 3.5 - 5.1 mmol/L   Chloride 104 98 - 111 mmol/L   CO2 21 (L) 22 - 32 mmol/L   Glucose, Bld 123 (H) 70 - 99 mg/dL    Comment: Glucose reference range applies only to samples taken after fasting for at least 8 hours.   BUN 32 (H) 6 - 20 mg/dL   Creatinine, Ser 1.48 (H) 0.44 - 1.00 mg/dL   Calcium 9.2 8.9 - 10.3 mg/dL   Total Protein 8.2 (H) 6.5 - 8.1 g/dL   Albumin 3.7 3.5 - 5.0 g/dL   AST 18 15 - 41 U/L   ALT 11 0 - 44 U/L   Alkaline Phosphatase 59 38 - 126 U/L   Total Bilirubin 0.7 0.3 - 1.2 mg/dL   GFR, Estimated 48 (L) >60 mL/min    Comment: (NOTE) Calculated using the CKD-EPI Creatinine Equation (2021)    Anion gap 9 5 - 15    Comment: Performed at [facility], [address] [place],  [address]  CBC with Differential     Status: Abnormal   Collection Time: 05/14/21 11:41 AM  Result Value Ref Range   WBC 47.7 (H) 4.0 - 10.5 K/uL   RBC 4.40 3.87 - 5.11 MIL/uL   Hemoglobin 13.3 12.0 - 15.0 g/dL   HCT 39.2  36.0 - 46.0 %   MCV 89.1 80.0 - 100.0 fL   MCH 30.2 26.0 - 34.0 pg   MCHC 33.9 30.0 - 36.0 g/dL   RDW 14.6 11.5 - 15.5 %   Platelets 231 150 - 400 K/uL   nRBC 0.0 0.0 - 0.2 %   Neutrophils Relative % 89 %    Comment: CORRECTED RESULTS CALLED TO: B SHOOK RN 1301 [id number] K FORSYTH CORRECTED ON 02/02 AT 1310: PREVIOUSLY REPORTED AS 19    Band Neutrophils 9 %    Comment: CORRECTED RESULTS CALLED TO: B SHOOK RN 1301 [id number] K FORSYTH    Lymphocytes Relative 3 %    Comment: CORRECTED RESULTS CALLED TO: B SHOOK RN 1301 [id number] K FORSYTH CORRECTED ON 02/02 AT 1310: PREVIOUSLY REPORTED AS 61    Monocytes Relative 5 %    Comment: CORRECTED RESULTS CALLED TO: B  SHOOK RN 1301 [phone] K FORSYTH CORRECTED ON 02/02 AT 1310: PREVIOUSLY REPORTED AS 16    Eosinophils Relative 0 %   Eosinophils Absolute 0.0 0.0 - 0.5 K/uL   Basophils Relative 0 %    Comment: CORRECTED RESULTS CALLED TO: B SHOOK RN 1301 [phone] K FORSYTH CORRECTED ON 02/02 AT 1313: PREVIOUSLY REPORTED AS 4    WBC Morphology      This specimen is toxic for cell cultures. The laboratory is unable to sufficiently dilute this toxicity and still ensure a reasonable chance for detection as the culture has been initiated twice, a repeat specimen is suggested.   RBC Morphology MORPHOLOGY UNREMARKABLE    Smear Review MORPHOLOGY UNREMARKABLE     Comment: Performed at [facility], [address], [place], [place] [address]  Procalcitonin - Baseline     Status: None   Collection Time: 05/14/21 11:41 AM  Result Value Ref Range   Procalcitonin 7.60 ng/mL    Comment:        Interpretation: PCT > 2 ng/mL: Systemic infection (sepsis) is likely, unless other causes are known. (NOTE)       Sepsis PCT Algorithm           Lower Respiratory Tract                                      Infection PCT Algorithm    ----------------------------     ----------------------------         PCT < 0.25 ng/mL                PCT < 0.10 ng/mL          Strongly  encourage             Strongly discourage   discontinuation of antibiotics    initiation of antibiotics    ----------------------------     -----------------------------       PCT 0.25 - 0.50 ng/mL            PCT 0.10 - 0.25 ng/mL               OR       >80% decrease in PCT            Discourage initiation of                                            antibiotics      Encourage discontinuation           of antibiotics    ----------------------------     -----------------------------         PCT >= 0.50 ng/mL              PCT 0.26 - 0.50 ng/mL               AND       <80% decrease in PCT              Encourage initiation of                                             antibiotics       Encourage continuation  of antibiotics    ----------------------------     -----------------------------        PCT >= 0.50 ng/mL                  PCT > 0.50 ng/mL               AND         increase in PCT                  Strongly encourage                                      initiation of antibiotics    Strongly encourage escalation           of antibiotics                                     -----------------------------                                           PCT <= 0.25 ng/mL                                                 OR                                        > 80% decrease in PCT                                      Discontinue / Do not initiate                                             antibiotics  Performed at [facility], [address] [place], [place] [address]   POC urine preg, ED     Status: None   Collection Time: 05/14/21  1:52 PM  Result Value Ref Range   Preg Test, Ur NEGATIVE NEGATIVE    Comment:        THE SENSITIVITY OF THIS METHODOLOGY IS >24 mIU/mL   Blood Culture (routine x 2)     Status: None (Preliminary result)   Collection Time: 05/14/21  2:09 PM   Specimen: BLOOD LEFT HAND  Result Value Ref Range   Specimen Description      BLOOD LEFT  HAND Performed at [facility], [address] [place], [place] [address]    Special Requests      BOTTLES DRAWN AEROBIC AND ANAEROBIC Blood Culture results may not be optimal due to an inadequate volume of blood received in culture bottles Performed at [facility], [address], [place], [place] [address]    Culture  Setup Time      GRAM NEGATIVE RODS IN BOTH AEROBIC AND ANAEROBIC BOTTLES Gram Stain Report Called to,Read  Back By and Verified With: ALLEN,T@0559  BY MATTHEWS, B 2.3.23 CRITICAL RESULT CALLED TO, READ BACK BY AND VERIFIED WITH: S HURTH PHARMD @1110  05/15/21 EB Performed at [facility], [place] [address] [place], [place] [address]    Culture GRAM NEGATIVE RODS    Report Status PENDING   Blood Culture ID Panel (Reflexed)     Status: Abnormal   Collection Time: 05/14/21  2:09 PM  Result Value Ref Range   Enterococcus faecalis NOT DETECTED NOT DETECTED   Enterococcus Faecium NOT DETECTED NOT DETECTED   Listeria monocytogenes NOT DETECTED NOT DETECTED   Staphylococcus species NOT DETECTED NOT DETECTED   Staphylococcus aureus (BCID) NOT DETECTED NOT DETECTED   Staphylococcus epidermidis NOT DETECTED NOT DETECTED   Staphylococcus lugdunensis NOT DETECTED NOT DETECTED   Streptococcus species NOT DETECTED NOT DETECTED   Streptococcus agalactiae NOT DETECTED NOT DETECTED   Streptococcus pneumoniae NOT DETECTED NOT DETECTED   Streptococcus pyogenes NOT DETECTED NOT DETECTED   A.calcoaceticus-baumannii NOT DETECTED NOT DETECTED   Bacteroides fragilis NOT DETECTED NOT DETECTED   Enterobacterales DETECTED (A) NOT DETECTED    Comment: Enterobacterales represent a large order of gram negative bacteria, not a single organism. CRITICAL RESULT CALLED TO, READ BACK BY AND VERIFIED WITH: S HURTH PHARMD @1110  05/15/21 EB    Enterobacter cloacae complex NOT DETECTED NOT DETECTED   Escherichia coli DETECTED (A) NOT DETECTED    Comment: CRITICAL RESULT CALLED TO, READ BACK BY AND  VERIFIED WITH: S HURTH PHARMD @1110  05/15/21 EB    Klebsiella aerogenes NOT DETECTED NOT DETECTED   Klebsiella oxytoca NOT DETECTED NOT DETECTED   Klebsiella pneumoniae NOT DETECTED NOT DETECTED   Proteus species NOT DETECTED NOT DETECTED   Salmonella species NOT DETECTED NOT DETECTED   Serratia marcescens NOT DETECTED NOT DETECTED   Haemophilus influenzae NOT DETECTED NOT DETECTED   Neisseria meningitidis NOT DETECTED NOT DETECTED   Pseudomonas aeruginosa NOT DETECTED NOT DETECTED   Stenotrophomonas maltophilia NOT DETECTED NOT DETECTED   Candida albicans NOT DETECTED NOT DETECTED   Candida auris NOT DETECTED NOT DETECTED   Candida glabrata NOT DETECTED NOT DETECTED   Candida krusei NOT DETECTED NOT DETECTED   Candida parapsilosis NOT DETECTED NOT DETECTED   Candida tropicalis NOT DETECTED NOT DETECTED   Cryptococcus neoformans/gattii NOT DETECTED NOT DETECTED   CTX-M ESBL NOT DETECTED NOT DETECTED   Carbapenem resistance IMP NOT DETECTED NOT DETECTED   Carbapenem resistance KPC NOT DETECTED NOT DETECTED   Carbapenem resistance NDM NOT DETECTED NOT DETECTED   Carbapenem resist OXA 48 LIKE NOT DETECTED NOT DETECTED   Carbapenem resistance VIM NOT DETECTED NOT DETECTED    Comment: Performed at [place] [address] [place], [place] [address]  Protime-INR     Status: Abnormal   Collection Time: 05/14/21  2:23 PM  Result Value Ref Range   Prothrombin Time 15.9 (H) 11.4 - 15.2 seconds   INR 1.3 (H) 0.8 - 1.2    Comment: (NOTE) INR goal varies based on device and disease states. Performed at [facility], [address] [place], [place] [address]   APTT     Status: None   Collection Time: 05/14/21  2:23 PM  Result Value Ref Range   aPTT 26 24 - 36 seconds    Comment: Performed at [facility], [address], [place], [place] [address]  Blood Culture (routine x 2)     Status: None (Preliminary result)   Collection Time: 05/14/21  2:23  PM   Specimen: BLOOD RIGHT HAND   Result Value Ref Range   Specimen Description BLOOD RIGHT HAND    Special Requests      BOTTLES DRAWN AEROBIC AND ANAEROBIC Blood Culture results may not be optimal due to an inadequate volume of blood received in culture bottles   Culture      NO GROWTH < 24 HOURS Performed at [facility], [address] [place], [place] [address]    Report Status PENDING   Lactic acid, plasma     Status: Abnormal   Collection Time: 05/14/21  2:25 PM  Result Value Ref Range   Lactic Acid, Venous 3.0 (HH) 0.5 - 1.9 mmol/L    Comment: CRITICAL RESULT CALLED TO, READ BACK BY AND VERIFIED WITH: OAKLEY,B ON 05/14/21 AT 1510 BY LOY,C Performed at [facility], [address] [place], [place] [address]   Urinalysis, Routine w reflex microscopic Urine, Catheterized     Status: Abnormal   Collection Time: 05/14/21  2:30 PM  Result Value Ref Range   Color, Urine YELLOW YELLOW   APPearance HAZY (A) CLEAR   Specific Gravity, Urine 1.010 1.005 - 1.030   pH 8.5 (H) 5.0 - 8.0   Glucose, UA NEGATIVE NEGATIVE mg/dL   Hgb urine dipstick TRACE (A) NEGATIVE   Bilirubin Urine NEGATIVE NEGATIVE   Ketones, ur NEGATIVE NEGATIVE mg/dL   Protein, ur TRACE (A) NEGATIVE mg/dL   Nitrite NEGATIVE NEGATIVE   Leukocytes,Ua LARGE (A) NEGATIVE    Comment: Performed at [facility], [address], [place], [place] [address]  Resp Panel by RT-PCR (Flu A&B, Covid) Nasopharyngeal Swab     Status: None   Collection Time: 05/14/21  2:30 PM   Specimen: Nasopharyngeal Swab; Nasopharyngeal(NP) swabs in vial transport medium  Result Value Ref Range   SARS Coronavirus 2 by RT PCR NEGATIVE NEGATIVE    Comment: (NOTE) SARS-CoV-2 target nucleic acids are NOT DETECTED.  The SARS-CoV-2 RNA is generally detectable in upper respiratory specimens during the acute phase of infection. The lowest concentration of SARS-CoV-2 viral copies this assay can detect is 138 copies/mL. A negative result does not preclude SARS-Cov-2 infection and should  not be used as the sole basis for treatment or other patient management decisions. A negative result may occur with  improper specimen collection/handling, submission of specimen other than nasopharyngeal swab, presence of viral mutation(s) within the areas targeted by this assay, and inadequate number of viral copies(<138 copies/mL). A negative result must be combined with clinical observations, patient history, and epidemiological information. The expected result is Negative.  Fact Sheet for Patients:  EntrepreneurPulse.com.au  Fact Sheet for Healthcare Providers:  IncredibleEmployment.be  This test is no t yet approved or cleared by the [place] FDA and  has been authorized for detection and/or diagnosis of SARS-CoV-2 by FDA under an Emergency Use Authorization (EUA). This EUA will remain  in effect (meaning this test can be used) for the duration of the COVID-19 declaration under Section 564(b)(1) of the Act, 21 U.S.C.section 360bbb-3(b)(1), unless the authorization is terminated  or revoked sooner.       Influenza A by PCR NEGATIVE NEGATIVE   Influenza B by PCR NEGATIVE NEGATIVE    Comment: (NOTE) The Xpert Xpress SARS-CoV-2/FLU/RSV plus assay is intended as an aid in the diagnosis of influenza from Nasopharyngeal swab specimens and should not be used as a sole basis for treatment. Nasal washings and aspirates are unacceptable for Xpert Xpress SARS-CoV-2/FLU/RSV testing.  Fact Sheet for Patients:  EntrepreneurPulse.com.au  Fact Sheet for Healthcare Providers: IncredibleEmployment.be  This test is not yet approved or cleared by the [place] FDA and has been authorized for detection and/or diagnosis of SARS-CoV-2 by FDA under an Emergency Use Authorization (EUA). This EUA will remain in effect (meaning this test can be used) for the duration of the COVID-19 declaration under Section 564(b)(1)  of the Act, 21 U.S.C. section 360bbb-3(b)(1), unless the authorization is terminated or revoked.  Performed at [facility], [address] [place], [place] [address]   Urinalysis, Microscopic (reflex)     Status: Abnormal   Collection Time: 05/14/21  2:30 PM  Result Value Ref Range   RBC / HPF 0-5 0 - 5 RBC/hpf   WBC, UA >50 0 - 5 WBC/hpf   Bacteria, UA MANY (A) NONE SEEN   Squamous Epithelial / LPF 0-5 0 - 5    Comment: Performed at [facility], [address], [place], [place] [address]  MRSA Next Gen by PCR, Nasal     Status: None   Collection Time: 05/14/21  6:00 PM   Specimen: Nasal Mucosa; Nasal Swab  Result Value Ref Range   MRSA by PCR Next Gen NOT DETECTED NOT DETECTED    Comment: (NOTE) The GeneXpert MRSA Assay (FDA approved for NASAL specimens only), is one component of a comprehensive MRSA colonization surveillance program. It is not intended to diagnose MRSA infection nor to guide or monitor treatment for MRSA infections. Test performance is not FDA approved in patients less than 66 years old. Performed at [facility], [address], [place], [place] [address]   Lactic acid, plasma     Status: Abnormal   Collection Time: 05/14/21  6:06 PM  Result Value Ref Range   Lactic Acid, Venous 6.3 (HH) 0.5 - 1.9 mmol/L    Comment: CRITICAL VALUE NOTED.  VALUE IS CONSISTENT WITH PREVIOUSLY REPORTED AND CALLED VALUE. Performed at [facility], [address] [place], [place] [address]   Lactic acid, plasma     Status: Abnormal   Collection Time: 05/14/21  8:14 PM  Result Value Ref Range   Lactic Acid, Venous 3.1 (HH) 0.5 - 1.9 mmol/L    Comment: CRITICAL VALUE NOTED.  VALUE IS CONSISTENT WITH PREVIOUSLY REPORTED AND CALLED VALUE. Performed at [facility], [address] [place], [place] [address]   Procalcitonin     Status: None   Collection Time: 05/15/21  4:15 AM  Result Value Ref Range   Procalcitonin 5.89 ng/mL    Comment:        Interpretation: PCT > 2  ng/mL: Systemic infection (sepsis) is likely, unless other causes are known. (NOTE)       Sepsis PCT Algorithm           Lower Respiratory Tract                                      Infection PCT Algorithm    ----------------------------     ----------------------------         PCT < 0.25 ng/mL                PCT < 0.10 ng/mL          Strongly encourage             Strongly discourage   discontinuation of antibiotics    initiation of antibiotics    ----------------------------     -----------------------------  PCT 0.25 - 0.50 ng/mL            PCT 0.10 - 0.25 ng/mL               OR       >80% decrease in PCT            Discourage initiation of                                            antibiotics      Encourage discontinuation           of antibiotics    ----------------------------     -----------------------------         PCT >= 0.50 ng/mL              PCT 0.26 - 0.50 ng/mL               AND       <80% decrease in PCT              Encourage initiation of                                             antibiotics       Encourage continuation           of antibiotics    ----------------------------     -----------------------------        PCT >= 0.50 ng/mL                  PCT > 0.50 ng/mL               AND         increase in PCT                  Strongly encourage                                      initiation of antibiotics    Strongly encourage escalation           of antibiotics                                     -----------------------------                                           PCT <= 0.25 ng/mL                                                 OR                                        > 80% decrease in PCT  Discontinue / Do not initiate                                             antibiotics  Performed at [facility], [address], [place], [place] [address]   Protime-INR     Status: Abnormal   Collection Time: 05/15/21   4:15 AM  Result Value Ref Range   Prothrombin Time 17.9 (H) 11.4 - 15.2 seconds   INR 1.5 (H) 0.8 - 1.2    Comment: (NOTE) INR goal varies based on device and disease states. Performed at [facility], [address], [place], [place] [id number]   Basic metabolic panel     Status: Abnormal   Collection Time: 05/15/21  4:15 AM  Result Value Ref Range   Sodium 137 135 - 145 mmol/L   Potassium 3.4 (L) 3.5 - 5.1 mmol/L    Comment: DELTA CHECK NOTED   Chloride 107 98 - 111 mmol/L   CO2 22 22 - 32 mmol/L   Glucose, Bld 106 (H) 70 - 99 mg/dL    Comment: Glucose reference range applies only to samples taken after fasting for at least 8 hours.   BUN 18 6 - 20 mg/dL   Creatinine, Ser 0.74 0.44 - 1.00 mg/dL   Calcium 8.2 (L) 8.9 - 10.3 mg/dL   GFR, Estimated >60 >60 mL/min    Comment: (NOTE) Calculated using the CKD-EPI Creatinine Equation (2021)    Anion gap 8 5 - 15    Comment: Performed at [facility], [address] [place], [place] [address]  CBC     Status: Abnormal   Collection Time: 05/15/21  4:15 AM  Result Value Ref Range   WBC 25.1 (H) 4.0 - 10.5 K/uL   RBC 3.43 (L) 3.87 - 5.11 MIL/uL   Hemoglobin 10.1 (L) 12.0 - 15.0 g/dL   HCT 30.8 (L) 36.0 - 46.0 %   MCV 89.8 80.0 - 100.0 fL   MCH 29.4 26.0 - 34.0 pg   MCHC 32.8 30.0 - 36.0 g/dL   RDW 14.8 11.5 - 15.5 %   Platelets 163 150 - 400 K/uL   nRBC 0.0 0.0 - 0.2 %    Comment: Performed at [facility], [address], [place], [place] [address]  Lactic acid, plasma     Status: None   Collection Time: 05/15/21  6:57 AM  Result Value Ref Range   Lactic Acid, Venous 1.0 0.5 - 1.9 mmol/L    Comment: Performed at [facility], [address] [place], [place] [address]    MICRO: 2/23 blood cx E.coli bacteremia on BCID 2/2 urine cx pending IMAGING: CT Head Wo Contrast  Result Date: 05/14/2021 CLINICAL DATA:  Headache, new or worsening, neuro deficit (Age 55-49y) EXAM: CT HEAD WITHOUT CONTRAST TECHNIQUE: Contiguous axial images  were obtained from the base of the skull through the vertex without intravenous contrast. RADIATION DOSE REDUCTION: This exam was performed according to the departmental dose-optimization program which includes automated exposure control, adjustment of the mA and/or kV according to patient size and/or use of iterative reconstruction technique. COMPARISON:  2014 FINDINGS: Brain: There is no acute intracranial hemorrhage, mass effect, or edema. Gray-white differentiation is preserved. Patchy and confluent areas of low density are present in periventricular white matter compatible with history of multiple sclerosis. Burden has mildly increased in comparison to the remote study. There  is no extra-axial fluid collection. Ventricles and sulci are stable in size and configuration. There is absence of the septum pellucidum. Vascular: No hyperdense vessel or unexpected calcification. Skull: Calvarium is unremarkable. Sinuses/Orbits: No acute finding. Other: None. IMPRESSION: No acute intracranial abnormality. White matter changes consistent with history of multiple sclerosis. Electronically Signed   By: Macy Mis M.D.   On: 05/14/2021 15:33   DG Chest Port 1 View  Result Date: 05/14/2021 CLINICAL DATA:  Headaches for 2 days. EXAM: PORTABLE CHEST 1 VIEW COMPARISON:  02/23/2021 FINDINGS: The heart size and mediastinal contours are within normal limits. Both lungs are clear. The visualized skeletal structures are unremarkable. IMPRESSION: No active disease. Electronically Signed   By: Kathreen Devoid M.D.   On: 05/14/2021 14:36   CT RENAL STONE STUDY  Result Date: 05/14/2021 CLINICAL DATA:  Sepsis, urinary retention, indwelling Foley catheter, generalized weakness and chills EXAM: CT ABDOMEN AND PELVIS WITHOUT CONTRAST TECHNIQUE: Multidetector CT imaging of the abdomen and pelvis was performed following the standard protocol without IV contrast. RADIATION DOSE REDUCTION: This exam was performed according to the  departmental dose-optimization program which includes automated exposure control, adjustment of the mA and/or kV according to patient size and/or use of iterative reconstruction technique. COMPARISON:  03/07/2021 FINDINGS: Lower chest: No acute pleural or parenchymal lung disease. Hepatobiliary: Calcified gallstones without evidence of cholecystitis. Unremarkable unenhanced appearance of the liver. Pancreas: Unremarkable unenhanced appearance. Spleen: Unremarkable unenhanced appearance. Adrenals/Urinary Tract: Bladder is decompressed with a Foley catheter. No urinary tract calculi or obstructive uropathy within either kidney. Evaluation of the renal parenchyma is limited without IV contrast. The adrenals are stable. Stomach/Bowel: No bowel obstruction or ileus. Normal appendix right lower quadrant. No bowel wall thickening or inflammatory change. Vascular/Lymphatic: No significant vascular findings are present. No enlarged abdominal or pelvic lymph nodes. Reproductive: Uterus and bilateral adnexa are unremarkable. Other: Trace free fluid in the pelvis may be physiologic. No free intraperitoneal gas. No abdominal wall hernia. Musculoskeletal: No acute or destructive bony lesions. Reconstructed images demonstrate no additional findings. IMPRESSION: 1. Cholelithiasis without cholecystitis. 2. No evidence of urinary tract calculi or obstructive uropathy. Indwelling Foley catheter decompresses the bladder. 3. Trace pelvic free fluid, likely physiologic. Electronically Signed   By: Randa Ngo M.D.   On: 05/14/2021 17:14    Assessment/Plan:  31yo F with MS admitted with sepsis from urinary source and secondary bacteremia - will narrow abtx to ceftriaxone 2gm IV daily, -her recent ecoli isolate (collected <10 days)- did not show any CTX -R - will continue to follow blood cx and urine cx results  Leukocytosis = improving  Aki = continues to improve getting back to her baseline with IVF hydration  MS= stable and  to address as outpatient with her neurologist.  Chronic foley catheter = recommend for nursing team to teach family how to manage foley cathter.

## 2021-05-15 NOTE — Assessment & Plan Note (Addendum)
-   Resolved - Likely due to sepsis, UTI, urinary retention -Foley catheter on initial presentation thought to be clogged, was replaced in ED on 05/15/21 -Status post IV fluid resuscitation -Creatinine -normalized

## 2021-05-15 NOTE — Progress Notes (Signed)
Noted patient's heart rate increasing & sustaining in the 140's, BP dropped in the 80s/60s, temp increased to 102.2 axillary, MD notified, 1 liter LR bolus given, Levophed restarted, patient placed on cooling blanket as ordered, patient responded to treatment well with no complaints

## 2021-05-15 NOTE — Progress Notes (Signed)
Patient current BP 131/58 with Levophed @ 40mcg/min titrated down from 45mcg/min, current rectal temp 99.9 & remains monitored on cooling blanket at this time, appetite has improved & has been able to eat a banana & jello

## 2021-05-15 NOTE — Progress Notes (Signed)
°  Transition of Care (TOC) Screening Note   Patient Details  Name: Melissa West Date of Birth: May 07, 1990   Transition of Care [facility]) CM/SW Contact:    Leitha Bleak, RN Phone Number: 05/15/2021, 1:42 PM    Transition of Care Department [facility]) has reviewed patient and no TOC needs have been identified at this time. We will continue to monitor patient advancement through interdisciplinary progression rounds. If new patient transition needs arise, please place a TOC consult.

## 2021-05-15 NOTE — Progress Notes (Signed)
Pharmacy Antibiotic Note  Melissa West is a 31 y.o. female admitted on 05/14/2021 with UTI.  Pharmacy has been consulted for cefepime dosing. Renal fxn improved, adjust dosing interval  Plan: Increase Cefepime 2000 mg IV every 8 hours. Monitor labs, c/s, and patient improvement.  Height: 5\' 3"  (160 cm) Weight: 65.7 kg (144 lb 13.5 oz) IBW/kg (Calculated) : 52.4  Temp (24hrs), Avg:99.5 F (37.5 C), Min:98.4 F (36.9 C), Max:100.3 F (37.9 C)  Recent Labs  Lab 05/13/21 1306 05/14/21 1141 05/14/21 1425 05/14/21 1806 05/14/21 2014 05/15/21 0415 05/15/21 0657  WBC 6.9 47.7*  --   --   --  25.1*  --   CREATININE 0.77 1.48*  --   --   --  0.74  --   LATICACIDVEN  --  1.9 3.0* 6.3* 3.1*  --  1.0     Estimated Creatinine Clearance: 92 mL/min (by C-G formula based on SCr of 0.74 mg/dL).    No Known Allergies  Antimicrobials this admission: Cefepime 2/2 >> CTX 2/2  Microbiology results: 2/2 BCx: ngtd 2/2 UCx: pending 1/26 Ucx: e. coli resistant to cipro, S- ceftriaxone 2/2 MRSA PCR is negative   Thank you for allowing pharmacy to be a part of this patients care.  2/26, BS Pharm D, [facility] Clinical Pharmacist Pager [phone] 05/15/2021 8:59 AM

## 2021-05-15 NOTE — Progress Notes (Signed)
Levophed stopped @ 0844, BPs stable, MD aware

## 2021-05-15 NOTE — Evaluation (Signed)
Physical Therapy Evaluation Patient Details Name: Melissa West MRN: [id number] DOB: 1991-03-23 Today's Date: 05/15/2021  History of Present Illness  Melissa West  is a 31 y.o. 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   Clinical Impression  Patient functioning at baseline for functional mobility.  Patient is non-ambulatory and requires 2 person total assist for tranfers at home.  Family has mechanical lift, but does not use due to lack of instruction, advised patient's mother to contact vendor to be provided instruction in use.  Patient discharged from physical therapy to care of nursing for using mechanical lift to transfer patient to chair as tolerated - RN aware.          Recommendations for follow up therapy are one component of a multi-disciplinary discharge planning process, led by the attending physician.  Recommendations may be updated based on patient status, additional functional criteria and insurance authorization.  Follow Up Recommendations No PT follow up    Assistance Recommended at Discharge Intermittent Supervision/Assistance  Patient can return home with the following  Other (comment);A lot of help with bathing/dressing/bathroom;Assistance with feeding;A lot of help with walking and/or transfers;Assistance with cooking/housework;Two people to help with bathing/dressing/bathroom    Equipment Recommendations None recommended by PT  Recommendations for Other Services       Functional Status Assessment Patient has not had a recent  decline in their functional status     Precautions / Restrictions Precautions Precautions: Fall Restrictions Weight Bearing Restrictions: No      Mobility  Bed Mobility Overal bed mobility: Needs Assistance Bed Mobility: Supine to Sit, Sit to Supine     Supine to sit: Max assist Sit to supine: Max assist   General bed mobility comments: slow labored movement, unable maintain sitting balance    Transfers                        Ambulation/Gait                  Stairs            Wheelchair Mobility    Modified Rankin (Stroke Patients Only)       Balance Overall balance assessment: Needs assistance Sitting-balance support: Feet supported, Bilateral upper extremity supported Sitting balance-Leahy Scale: Poor Sitting balance - Comments: seated at EOB                                     Pertinent Vitals/Pain Pain Assessment Pain Assessment: No/denies pain    Home Living Family/patient expects to be discharged to:: Private residence Living Arrangements: Alone Available Help at Discharge: Other (Comment) Type of Home: House Home Access: Ramped entrance       Home Layout: One level;Able to live on main level with bedroom/bathroom Home Equipment: Wheelchair - manual;BSC/3in1;Shower seat      Prior Function Prior Level of Function : Needs assist       Physical Assist : Mobility (physical);ADLs (physical)     Mobility Comments: Patient reports that her home  health aide assists her to transfer from bed to wheelchair as needed. ADLs Comments: Assisted by home health aide.     Hand Dominance   Dominant Hand: Left    Extremity/Trunk Assessment   Upper Extremity Assessment Upper Extremity Assessment: Defer to OT evaluation    Lower Extremity Assessment Lower Extremity Assessment: Generalized weakness    Cervical / Trunk Assessment Cervical / Trunk Assessment: Kyphotic  Communication   Communication: No  difficulties  Cognition Arousal/Alertness: Awake/alert Behavior During Therapy: WFL for tasks assessed/performed Overall Cognitive Status: Within Functional Limits for tasks assessed                                          General Comments      Exercises     Assessment/Plan    PT Assessment Patient does not need any further PT services  PT Problem List         PT Treatment Interventions      PT Goals (Current goals can be found in the Care Plan section)  Acute Rehab PT Goals Patient Stated Goal: return home with family, home aides to assist PT Goal Formulation: With patient/family Time For Goal Achievement: 05/15/21 Potential to Achieve Goals: Good    Frequency       Co-evaluation               AM-PAC PT "6 Clicks" Mobility  Outcome Measure Help needed turning from your back to your side while in a flat bed without using bedrails?: A Lot Help needed moving from lying on your back to sitting on the side of a flat bed without using bedrails?: A Lot Help needed moving to and from a bed to a chair (including a wheelchair)?: Total Help needed standing up from a chair using your arms (e.g., wheelchair or bedside chair)?: Total Help needed to walk in hospital room?: Total Help needed climbing 3-5 steps with a railing? : Total 6 Click Score: 8    End of Session   Activity Tolerance: Patient limited by fatigue;Patient tolerated treatment well Patient left: in bed Nurse Communication: Mobility status PT Visit Diagnosis: Unsteadiness on feet (R26.81);Other abnormalities of gait and mobility (R26.89);Muscle weakness (generalized) (M62.81)    Time: 9381-0175 PT Time Calculation (min) (ACUTE ONLY): 20 min   Charges:   PT Evaluation $PT Eval Moderate Complexity: 1 Mod PT Treatments $Therapeutic Activity: 8-22 mins        3:07 PM, 05/15/21 Ocie Bob, MPT Physical Therapist with [facility] [phone] office [phone]  mobile phone

## 2021-05-15 NOTE — Assessment & Plan Note (Addendum)
-   Denies any double vision, denies any worsening weakness in her upper extremity based on lower extremity weakness at baseline, hemiplegic wheelchair dependent --She is following with Dr. 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

## 2021-05-15 NOTE — Assessment & Plan Note (Addendum)
- -   Blood cultures growing E. coli and Enterobacter from 05-14-21 -Repeated blood cultures 05/17/2021 >> no growth to date -Urine culture >> Morganella, Proteus----??? urine organisms contamination the patient with chronic indwelling Foley -Treated with Rocephin, transition to cefepime on 05/17/2021 -Discussion with ID physician Dr. Algis Liming recommends switching to [facility] on 05/19/2021 for additional 5 days to complete treatment course -Sepsis pathophysiology resolved

## 2021-05-16 ENCOUNTER — Inpatient Hospital Stay (HOSPITAL_COMMUNITY): Payer: Medicare HMO

## 2021-05-16 DIAGNOSIS — R0789 Other chest pain: Secondary | ICD-10-CM | POA: Diagnosis not present

## 2021-05-16 DIAGNOSIS — N179 Acute kidney failure, unspecified: Secondary | ICD-10-CM | POA: Diagnosis not present

## 2021-05-16 DIAGNOSIS — I9589 Other hypotension: Secondary | ICD-10-CM | POA: Diagnosis not present

## 2021-05-16 DIAGNOSIS — D72829 Elevated white blood cell count, unspecified: Secondary | ICD-10-CM

## 2021-05-16 DIAGNOSIS — R079 Chest pain, unspecified: Secondary | ICD-10-CM | POA: Diagnosis not present

## 2021-05-16 DIAGNOSIS — A419 Sepsis, unspecified organism: Secondary | ICD-10-CM | POA: Diagnosis not present

## 2021-05-16 LAB — COMPREHENSIVE METABOLIC PANEL
ALT: 25 U/L (ref 0–44)
AST: 35 U/L (ref 15–41)
Albumin: 2.6 g/dL — ABNORMAL LOW (ref 3.5–5.0)
Alkaline Phosphatase: 60 U/L (ref 38–126)
Anion gap: 7 (ref 5–15)
BUN: 10 mg/dL (ref 6–20)
CO2: 23 mmol/L (ref 22–32)
Calcium: 8.3 mg/dL — ABNORMAL LOW (ref 8.9–10.3)
Chloride: 106 mmol/L (ref 98–111)
Creatinine, Ser: 0.72 mg/dL (ref 0.44–1.00)
GFR, Estimated: >60 mL/min (ref >60)
Glucose, Bld: 81 mg/dL (ref 70–99)
Potassium: 3.5 mmol/L (ref 3.5–5.1)
Sodium: 136 mmol/L (ref 135–145)
Total Bilirubin: 0.3 mg/dL (ref 0.3–1.2)
Total Protein: 6.3 g/dL — ABNORMAL LOW (ref 6.5–8.1)

## 2021-05-16 LAB — CBC WITH DIFFERENTIAL/PLATELET
Abs Immature Granulocytes: 0.06 10*3/uL (ref 0.00–0.07)
Basophils Absolute: 0 10*3/uL (ref 0.0–0.1)
Basophils Relative: 0 %
Eosinophils Absolute: 0 10*3/uL (ref 0.0–0.5)
Eosinophils Relative: 0 %
HCT: 34.1 % — ABNORMAL LOW (ref 36.0–46.0)
Hemoglobin: 10.7 g/dL — ABNORMAL LOW (ref 12.0–15.0)
Immature Granulocytes: 1 %
Lymphocytes Relative: 11 %
Lymphs Abs: 1 10*3/uL (ref 0.7–4.0)
MCH: 28.5 pg (ref 26.0–34.0)
MCHC: 31.4 g/dL (ref 30.0–36.0)
MCV: 90.9 fL (ref 80.0–100.0)
Monocytes Absolute: 0.2 10*3/uL (ref 0.1–1.0)
Monocytes Relative: 3 %
Neutro Abs: 7.4 10*3/uL (ref 1.7–7.7)
Neutrophils Relative %: 85 %
Platelets: 145 10*3/uL — ABNORMAL LOW (ref 150–400)
RBC: 3.75 MIL/uL — ABNORMAL LOW (ref 3.87–5.11)
RDW: 14.8 % (ref 11.5–15.5)
WBC: 8.7 10*3/uL (ref 4.0–10.5)
nRBC: 0 % (ref 0.0–0.2)

## 2021-05-16 LAB — PROCALCITONIN: Procalcitonin: 3.79 ng/mL

## 2021-05-16 LAB — TROPONIN I (HIGH SENSITIVITY): Troponin I (High Sensitivity): 9 ng/L (ref <18)

## 2021-05-16 LAB — BRAIN NATRIURETIC PEPTIDE: B Natriuretic Peptide: 297 pg/mL — ABNORMAL HIGH (ref 0.0–100.0)

## 2021-05-16 MED ORDER — ASPIRIN 325 MG PO TABS
325.0000 mg | ORAL_TABLET | Freq: Once | ORAL | Status: AC
  Administered 2021-05-16: 325 mg via ORAL
  Filled 2021-05-16: qty 1

## 2021-05-16 MED ORDER — NITROGLYCERIN 0.4 MG SL SUBL: 0.4000 mg | SUBLINGUAL_TABLET | SUBLINGUAL | Status: DC | PRN

## 2021-05-16 MED ORDER — FUROSEMIDE 10 MG/ML IJ SOLN
40.0000 mg | Freq: Once | INTRAMUSCULAR | Status: AC
  Administered 2021-05-16: 40 mg via INTRAVENOUS
  Filled 2021-05-16: qty 4

## 2021-05-16 MED ORDER — TRAMADOL HCL 50 MG PO TABS
50.0000 mg | ORAL_TABLET | Freq: Three times a day (TID) | ORAL | Status: DC | PRN
  Administered 2021-05-17 – 2021-05-18 (×2): 50 mg via ORAL
  Filled 2021-05-16 (×2): qty 1

## 2021-05-16 MED ORDER — ALUM & MAG HYDROXIDE-SIMETH 200-200-20 MG/5ML PO SUSP
30.0000 mL | Freq: Four times a day (QID) | ORAL | Status: DC | PRN
  Administered 2021-05-16: 30 mL via ORAL
  Filled 2021-05-16: qty 30

## 2021-05-16 MED ORDER — SODIUM CHLORIDE 0.9 % IV BOLUS
500.0000 mL | Freq: Once | INTRAVENOUS | Status: AC
  Administered 2021-05-16: 500 mL via INTRAVENOUS

## 2021-05-16 MED ORDER — MIDODRINE HCL 5 MG PO TABS
10.0000 mg | ORAL_TABLET | Freq: Three times a day (TID) | ORAL | Status: DC
  Administered 2021-05-17 – 2021-05-20 (×11): 10 mg via ORAL
  Filled 2021-05-16 (×11): qty 2

## 2021-05-16 MED ORDER — PANTOPRAZOLE SODIUM 40 MG PO TBEC
40.0000 mg | DELAYED_RELEASE_TABLET | Freq: Every day | ORAL | Status: DC
  Administered 2021-05-16 – 2021-05-20 (×5): 40 mg via ORAL
  Filled 2021-05-16 (×5): qty 1

## 2021-05-16 MED ORDER — MIDODRINE HCL 5 MG PO TABS: 10.0000 mg | ORAL_TABLET | Freq: Three times a day (TID) | ORAL | Status: DC

## 2021-05-16 NOTE — Progress Notes (Signed)
Patient is improving  No susceptibilities yet available.  Continue with ceftriaxone Will continue to monitor cultures.  Thayer Headings, MD

## 2021-05-16 NOTE — Assessment & Plan Note (Addendum)
-   Brief episode-resolved -EKG without acute findings -Remains chest pain-free

## 2021-05-16 NOTE — Progress Notes (Signed)
MD notified of patient's BP's becoming soft again as the shift is ending. Order for Midodrine to be re-started.

## 2021-05-16 NOTE — Progress Notes (Signed)
PROGRESS NOTE    Patient: Melissa West                            PCP: Celene Squibb, MD                    DOB: [birth year]            DOA: 05/14/2021 FFM:[id number]             DOS: 05/16/2021, 11:35 AM   LOS: 2 days   Date of Service: The patient was seen and examined on 05/16/2021  Subjective:   The patient was seen and examined this morning, awake alert oriented in no acute distress, blood pressure is improved, complaining of mild headaches Denies having fever chills....  Later this morning she was complaining of brief episode of chest pain this morning.   Brief Narrative:    Per HPI:  Jenean Escandon  is a 31 y.o. 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 -ED patient was noted to be hypotensive, with significant leukocytosis at 47, repeat lactic acid was elevated at 3, her UA was strongly positive, Foley catheter was exchanged in ED, she was started empirically on vancomycin and Zosyn, hypotension responded to fluid boluses, as well creatinine elevated at 1.47  ED: Blood pressure (!) 81/56, pulse (!) 106, temperature 100.2 F (37.9 C), temperature source Oral, resp. rate (!) 21, height 5' 3" (1.6 m), weight 81.6 kg, SpO2 100 %. WBC: 47.7   BMP BUN 32 creatinine 1.48 UA: Negative nitrite, large leukocyte esterase, WBCs    Assessment & Plan:   Active Problems:   Sepsis secondary to UTI ([place])   AKI (acute kidney injury) ([place])   Chest pain   Hypotension   MS (multiple sclerosis) ([facility])   Leukocytosis   Wheelchair dependence   Acute urinary  retention   Pressure injury of skin     Assessment and Plan: Sepsis secondary to UTI ([place]) Improved from time sepsis pathophysiology -Hemodynamically stable, afebrile normotensive this morning -Tapered off pressors  - Patient met sepsis criteria on admission provided leukocytosis hypotensive lactic acidosis hypotensive and tachycardic -Patient was started on sepsis pathway -Status post IV fluid resuscitation, -Was started on V antibiotic vancomycin and Zosyn >>  Cefepime >>> IV Rocephin -ID consulted, recommended IV Rocephin   -WBC of 25.1 >>> 8.7 -Lactic acid 6.3, 3.1, 1.0  -Creatinine 1.48 >> 0.74 >>0.72 -Infectious disease Dr. Graylon Good from [facility] has been consulted-  appreciate F/ups   Chest pain - Brief atypical complaint of chest pain this morning -EKG reveal a specific ST elevation depression -Aspirin, nitroglycerin, as needed morphine ordered -Maalox, Protonix ordered -We will monitor closely -Obtaining chest x-ray -Patient has received aggressive IV fluid resuscitation for sepsis, 40 mg of IV Lasix will be given  AKI (acute kidney injury) ([place])- (present on admission) -Resolved - Likely due to sepsis, UTI, urinary retention -Foley catheter on initial presentation thought to be clogged, was replaced -Status post IV fluid resuscitation -Creatinine 1.47 >> improved to 0.74  >>>0.72  Hypotension- (present  on admission) -Blood pressure stabilized -Will discontinue midodrine and pressors -Levophed has been discontinued overnight  - On admission patient was found hypotensive likely due to sepsis -Started on aggressive IV fluid resuscitation LR, Levophed was initiated   Leukocytosis- (present on admission) - Due to sepsis, much improved  MS (multiple sclerosis) ([place])- (present on admission) - Denies any double vision, denies any worsening weakness in her upper extremity based on lower extremity weakness at baseline, hemiplegic wheelchair dependent --She is  following with Dr. Public librarian in [place] -Stable   Wheelchair dependence - Due to [place] -Stable  Pressure injury of skin- (present on admission) - Per nurses evaluation, continue nursing care, wound care -Continue nursing care, including rotating every 2 hours  Acute urinary retention- (present on admission) - Due to [place], neurogenic bladder -Chronic indwelling urine catheter, was changed in ER overnight 05/15/2021 -Clear urine with good output noted       Skin Assessment: I have examined the patient's skin and I agree with the wound assessment as performed by wound care team As outlined belowe: Pressure Injury 03/08/21 Sacrum Medial Stage 2 -  Partial thickness loss of dermis presenting as a shallow open injury with a red, pink wound bed without slough. 1 by 1 stage 2 area (Active)  03/08/21 0913  Location: Sacrum  Location Orientation: Medial  Staging: Stage 2 -  Partial thickness loss of dermis presenting as a shallow open injury with a red, pink wound bed without slough.  Wound Description (Comments): 1 by 1 stage 2 area  Present on Admission: Yes     --------------------------------------------------------------------------------------------------------------------------- Cultures; Blood Cultures x 2 >> NGT Urine Culture  >>> NGT   ------------------------------------------------------------------------------------------------------------------------------------------------  DVT prophylaxis:  enoxaparin (LOVENOX) injection 40 mg Start: 05/15/21 1000   Code Status:   Code Status: Full Code  Family Communication: No family member present at bedside- attempt will be made to update her mother today The above findings and plan of care has been discussed with patient (and family)  in detail,  they expressed understanding and agreement of above. -Advance care planning has been discussed.   Admission status:   Status is: Inpatient Remains inpatient appropriate  because: Requiring aggressive treatment for sepsis, IV fluid IV antibiotics,    Consults: infectious disease at [facility] has been remotely consulted  Disposition: From home  Anticipating to be discharged back home in next 2-3 days with home health       Procedures:   No admission procedures for hospital encounter.   Antimicrobials:  Anti-infectives (From admission, onward)    Start     Dose/Rate Route Frequency Ordered Stop   05/15/21 1800  cefTRIAXone (ROCEPHIN) 2 g in sodium chloride 0.9 % 100 mL IVPB        2 g 200 mL/hr over 30 Minutes Intravenous Every 24 hours 05/15/21 1224     05/15/21 1000  ceFEPIme (MAXIPIME) 2 g in sodium chloride 0.9 % 100 mL IVPB  Status:  Discontinued        2 g 200 mL/hr over 30 Minutes Intravenous Every 8 hours 05/15/21 0902 05/15/21 1224   05/14/21 1800  ceFEPIme (MAXIPIME) 2 g in sodium chloride 0.9 % 100 mL IVPB  Status:  Discontinued        2 g 200 mL/hr over 30 Minutes Intravenous Every 12 hours 05/14/21 1652 05/15/21 0902   05/14/21 1415  cefTRIAXone (ROCEPHIN) 2 g in sodium chloride 0.9 % 100 mL IVPB  Status:  Discontinued  2 g 200 mL/hr over 30 Minutes Intravenous Every 24 hours 05/14/21 1407 05/14/21 1652        Medication:   aspirin  325 mg Oral Once   Chlorhexidine Gluconate Cloth  6 each Topical Daily   enoxaparin (LOVENOX) injection  40 mg Subcutaneous Q24H   furosemide  40 mg Intravenous Once   lactobacillus  1 g Oral TID WC   pantoprazole  40 mg Oral Daily    acetaminophen **OR** acetaminophen, alum & mag hydroxide-simeth, nitroGLYCERIN, traMADol   Objective:   Vitals:   05/16/21 1010 05/16/21 1058 05/16/21 1100 05/16/21 1125  BP: 112/70 (!) 185/69 (!) 185/69   Pulse: 90 83 72   Resp: _0 Temp: 100 F (37.8 C)   100.3 F (37.9 C)  TempSrc: Rectal   Rectal  SpO2: 98% 91% 96%   Weight:      Height:        Intake/Output Summary (Last 24 hours) at 05/16/2021 1135 Last data filed at 05/16/2021  0900 Gross per 24 hour  Intake 4869.52 ml  Output 2800 ml  Net 2069.52 ml   Filed Weights   05/14/21 1030 05/15/21 0500  Weight: 81.6 kg 65.7 kg     Examination:     Physical Exam:   General:  Alert, oriented, cooperative, no distress;   HEENT:  Normocephalic, PERRL, otherwise with in Normal limits   Neuro:  Paraplegic, reduced sensory and motor function lower extremities strength and sensation intact in upper extremities  CNII-XII intact. ,   Lungs:   Clear to auscultation BL, Respirations unlabored, no wheezes / crackles  Cardio:    S1/S2, RRR, No murmure, No Rubs or Gallops   Abdomen:   Soft, non-tender, bowel sounds active all four quadrants,  no guarding or peritoneal signs.  Muscular skeletal:  Paraplegic, +1 pitting edema with mild contraction of the lower extremities Limited exam - in bed, able to move bilateral upper extremities with good strength 2+ pulses,  symmetric,  Skin:  Dry, warm to touch, negative for any Rashes,  Wounds: Please see nursing documentation  Pressure Injury 03/08/21 Sacrum Medial Stage 2 -  Partial thickness loss of dermis presenting as a shallow open injury with a red, pink wound bed without slough. 1 by 1 stage 2 area (Active)  03/08/21 0913  Location: Sacrum  Location Orientation: Medial  Staging: Stage 2 -  Partial thickness loss of dermis presenting as a shallow open injury with a red, pink wound bed without slough.  Wound Description (Comments): 1 by 1 stage 2 area  Present on Admission: Yes         ------------------------------------------------------------------------------------------------------------------------------------------    LABs:  CBC Latest Ref Rng & Units 05/16/2021 05/15/2021 05/14/2021  WBC 4.0 - 10.5 K/uL 8.7 25.1(H) 47.7(H)  Hemoglobin 12.0 - 15.0 g/dL 10.7(L) 10.1(L) 13.3  Hematocrit 36.0 - 46.0 % 34.1(L) 30.8(L) 39.2  Platelets 150 - 400 K/uL 145(L) 163 231   CMP Latest Ref Rng & Units 05/16/2021 05/15/2021 05/14/2021   Glucose 70 - 99 mg/dL 81 106(H) 123(H)  BUN 6 - 20 mg/dL 10 18 32(H)  Creatinine 0.44 - 1.00 mg/dL 0.72 0.74 1.48(H)  Sodium 135 - 145 mmol/L 136 137 134(L)  Potassium 3.5 - 5.1 mmol/L 3.5 3.4(L) 4.4  Chloride 98 - 111 mmol/L 106 107 104  CO2 22 - 32 mmol/L 23 22 21(L)  Calcium 8.9 - 10.3 mg/dL 8.3(L) 8.2(L) 9.2  Total Protein 6.5 - 8.1 g/dL 6.3(L) - 8.2(H)  Total Bilirubin 0.3 - 1.2 mg/dL 0.3 - 0.7  Alkaline Phos 38 - 126 U/L 60 - 59  AST 15 - 41 U/L 35 - 18  ALT 0 - 44 U/L 25 - 11       Micro Results Pending cultures:  urine cultures, blood cultures Influenza A/B/SARS-CoV-2 all negative   Radiology Reports No results found.  SIGNED: Deatra James, MD, FHM. [facility],  Pager (please use [email or web address] to page/text) Please use Epic Secure Chat for non-urgent communication (7AM-7PM)  > 66 minutes of critical care time was spent in evaluating the patient, reviewing all medical records, electronic records, discussing plan of care with the patient ICU nursing staff ID consult --stabilizing the patient in ICU setting  if 7PM-7AM, please contact night-coverage [email or web address], 05/16/2021, 11:35 AM

## 2021-05-16 NOTE — Progress Notes (Signed)
Levophed started overnight at . BP's stable this morning, stopped at 0815. Will continue to monitor.

## 2021-05-17 DIAGNOSIS — R7881 Bacteremia: Secondary | ICD-10-CM | POA: Diagnosis present

## 2021-05-17 DIAGNOSIS — A419 Sepsis, unspecified organism: Secondary | ICD-10-CM | POA: Diagnosis not present

## 2021-05-17 DIAGNOSIS — L8990 Pressure ulcer of unspecified site, unspecified stage: Secondary | ICD-10-CM

## 2021-05-17 DIAGNOSIS — N179 Acute kidney failure, unspecified: Secondary | ICD-10-CM | POA: Diagnosis not present

## 2021-05-17 DIAGNOSIS — I9589 Other hypotension: Secondary | ICD-10-CM | POA: Diagnosis not present

## 2021-05-17 DIAGNOSIS — R339 Retention of urine, unspecified: Secondary | ICD-10-CM

## 2021-05-17 DIAGNOSIS — R079 Chest pain, unspecified: Secondary | ICD-10-CM

## 2021-05-17 DIAGNOSIS — Z993 Dependence on wheelchair: Secondary | ICD-10-CM

## 2021-05-17 LAB — CBC WITH DIFFERENTIAL/PLATELET
Abs Immature Granulocytes: 0.04 10*3/uL (ref 0.00–0.07)
Basophils Absolute: 0 10*3/uL (ref 0.0–0.1)
Basophils Relative: 0 %
Eosinophils Absolute: 0.1 10*3/uL (ref 0.0–0.5)
Eosinophils Relative: 1 %
HCT: 27.7 % — ABNORMAL LOW (ref 36.0–46.0)
Hemoglobin: 8.9 g/dL — ABNORMAL LOW (ref 12.0–15.0)
Immature Granulocytes: 1 %
Lymphocytes Relative: 26 %
Lymphs Abs: 1.2 10*3/uL (ref 0.7–4.0)
MCH: 28 pg (ref 26.0–34.0)
MCHC: 32.1 g/dL (ref 30.0–36.0)
MCV: 87.1 fL (ref 80.0–100.0)
Monocytes Absolute: 0.4 10*3/uL (ref 0.1–1.0)
Monocytes Relative: 9 %
Neutro Abs: 2.8 10*3/uL (ref 1.7–7.7)
Neutrophils Relative %: 63 %
Platelets: 144 10*3/uL — ABNORMAL LOW (ref 150–400)
RBC: 3.18 MIL/uL — ABNORMAL LOW (ref 3.87–5.11)
RDW: 14.6 % (ref 11.5–15.5)
WBC: 4.5 10*3/uL (ref 4.0–10.5)
nRBC: 0 % (ref 0.0–0.2)

## 2021-05-17 LAB — COMPREHENSIVE METABOLIC PANEL
ALT: 125 U/L — ABNORMAL HIGH (ref 0–44)
AST: 140 U/L — ABNORMAL HIGH (ref 15–41)
Albumin: 2.2 g/dL — ABNORMAL LOW (ref 3.5–5.0)
Alkaline Phosphatase: 114 U/L (ref 38–126)
Anion gap: 6 (ref 5–15)
BUN: 8 mg/dL (ref 6–20)
CO2: 27 mmol/L (ref 22–32)
Calcium: 7.7 mg/dL — ABNORMAL LOW (ref 8.9–10.3)
Chloride: 103 mmol/L (ref 98–111)
Creatinine, Ser: 0.6 mg/dL (ref 0.44–1.00)
GFR, Estimated: >60 mL/min (ref >60)
Glucose, Bld: 91 mg/dL (ref 70–99)
Potassium: 3.1 mmol/L — ABNORMAL LOW (ref 3.5–5.1)
Sodium: 136 mmol/L (ref 135–145)
Total Bilirubin: 0.2 mg/dL — ABNORMAL LOW (ref 0.3–1.2)
Total Protein: 5.7 g/dL — ABNORMAL LOW (ref 6.5–8.1)

## 2021-05-17 LAB — CULTURE, BLOOD (ROUTINE X 2)

## 2021-05-17 LAB — URINE CULTURE: Culture: >=100000 — AB

## 2021-05-17 MED ORDER — SODIUM CHLORIDE 0.9 % IV SOLN
2.0000 g | Freq: Three times a day (TID) | INTRAVENOUS | Status: DC
  Administered 2021-05-17 – 2021-05-19 (×6): 2 g via INTRAVENOUS
  Filled 2021-05-17 (×6): qty 2

## 2021-05-17 MED ORDER — POTASSIUM CHLORIDE CRYS ER 20 MEQ PO TBCR
40.0000 meq | EXTENDED_RELEASE_TABLET | Freq: Once | ORAL | Status: AC
  Administered 2021-05-17: 40 meq via ORAL
  Filled 2021-05-17: qty 2

## 2021-05-17 NOTE — Assessment & Plan Note (Addendum)
-   Blood cultures growing E. coli and Enterobacter from 05-14-21 -Repeated blood cultures 05/17/2021 >> no growth to date -Urine culture >> Morganella, Proteus----??? urine organisms contamination the patient with chronic indwelling Foley -Treated with Rocephin, transitioned to cefepime on 05/17/2021 -Discussion with ID physician Dr. Algis Liming recommends switching to [facility] on 05/19/2021 for additional 5 days to complete treatment course

## 2021-05-17 NOTE — Progress Notes (Signed)
Cultures reviewed and urine culture with Morganella morganii and blood culture with E coli.  Had been on cefepime.  It is unclear which organism is causing her underlying sepsis and so I will have her changed back to cefepime.   Gardiner Barefoot, MD

## 2021-05-17 NOTE — Progress Notes (Signed)
PROGRESS NOTE    Patient: Melissa West                            PCP: Celene Squibb, MD                    DOB: [birth year]            DOA: 05/14/2021 TDD:[id number]             DOS: 05/17/2021, 9:56 AM   LOS: 3 days   Date of Service: The patient was seen and examined on 05/17/2021  Subjective:   The patient was seen and examined this morning, stable no acute distress complaining of mild headache but denies any chest pain or shortness of breath Hemodynamically stable Started on midodrine overnight BP 114/67  this morning   Brief Narrative:    Per HPI:  Melissa West  is a 31 y.o. 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 -ED patient was noted to be hypotensive, with significant leukocytosis at 47, repeat lactic acid was elevated at 3, her UA was strongly positive, Foley catheter was exchanged in ED, she was started empirically on vancomycin and Zosyn, hypotension responded to fluid boluses, as well creatinine elevated at 1.47  ED: Blood pressure (!) 81/56, pulse (!) 106, temperature 100.2 F (37.9 C), temperature source Oral, resp. rate (!) 21, height _0  (1.6 m), weight 81.6 kg, SpO2 100 %. WBC: 47.7   BMP BUN 32 creatinine 1.48 UA: Negative nitrite, large leukocyte esterase, WBCs    Assessment & Plan:   Active Problems:   Sepsis secondary to UTI ([place])   AKI (acute kidney injury) ([place])   Bacteremia   Hypotension   Chest pain   MS (multiple sclerosis) ([place])   Wheelchair dependence   Leukocytosis   Acute urinary retention   Pressure  injury of skin     Assessment and Plan: Sepsis secondary to UTI ([place]) -Much improved sepsis physiology -Hypertensive otherwise afebrile, improved leukocytosis --Off pressors, midodrine had to be restarted overnight  - Patient met sepsis criteria on admission provided leukocytosis hypotensive lactic acidosis hypotensive and tachycardic -Patient was started on sepsis pathway -Status post IV fluid resuscitation, -Was started on V antibiotic vancomycin and Zosyn >>  Cefepime >>> IV Rocephin -ID consulted, recommended IV Rocephin   -WBC of 25.1 >>> 8.7 >> 4.5 -Lactic acid 6.3, 3.1, 1.0  -Creatinine 1.48 >> 0.74 >>0.72 -Infectious disease Dr. Graylon Good from [facility] has been consulted-  appreciate F/ups   Bacteremia- (present on admission) - Blood cultures growing E. coli and Enterobacter from 2-23 -Repeated blood cultures 05/17/2021 >. -Urine culture pending >> -Antibiotic as above Rocephin 2 g daily -ID following  AKI (acute kidney injury) ([place])- (present on admission) -Resolved - Likely due to sepsis, UTI, urinary retention -Foley catheter on initial presentation thought to be clogged, was replaced -Status post IV fluid resuscitation -Creatinine 1.47 >> improved to 0.74  >>>0.72 >> 0.60  Chest pain - Brief episode-resolved -EKG reveal a specific ST elevation depression -Aspirin, nitroglycerin, as needed morphine ordered -  Maalox, Protonix ordered -We will monitor closely -Obtaining chest x-ray -Patient has received aggressive IV fluid resuscitation for sepsis, 40 mg of IV Lasix will be given  Hypotension- (present on admission) -Blood pressure remained soft -Off pressors-Levophed -Restarting midodrine 10 mg p.o. 3 times daily started overnight again 05/17/2021 -We will continue to monitor -Hypotensive likely related to autonomic system due to advanced MS Was exacerbated by sepsis  - On admission patient was found hypotensive likely due to sepsis -Started on aggressive IV  fluid resuscitation LR, Levophed was initiated   MS (multiple sclerosis) ([place])- (present on admission) - Denies any double vision, denies any worsening weakness in her upper extremity based on lower extremity weakness at baseline, hemiplegic wheelchair dependent --She is following with Dr. 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 -Stable  Leukocytosis- (present on admission) - Due to sepsis, -Resolved  Acute urinary retention- (present on admission) - Due to [place], neurogenic bladder -Chronic indwelling urine catheter, was changed in ER overnight 05/15/2021 -Clear urine with good output noted   Pressure injury of skin- (present on admission) - Per nurses evaluation, continue nursing care, wound care -Continue nursing care, including rotating every 2 hours      Skin Assessment: I have examined the patient's skin and I agree with the wound assessment as performed by wound care team As outlined belowe: Pressure Injury 03/08/21 Sacrum Medial Stage 2 -  Partial thickness loss of dermis presenting as a shallow open injury with a red, pink wound bed without slough. 1 by 1 stage 2 area (Active)  03/08/21 0913  Location: Sacrum  Location Orientation: Medial  Staging: Stage 2 -  Partial thickness loss of dermis presenting as a shallow open injury with a red, pink wound bed without slough.  Wound Description (Comments): 1 by 1 stage 2 area  Present on Admission: Yes     --------------------------------------------------------------------------------------------------------------------------- Cultures; Blood Cultures x 2 >> NGT Urine Culture  >>> NGT   ------------------------------------------------------------------------------------------------------------------------------------------------  DVT prophylaxis:  enoxaparin (LOVENOX) injection 40 mg Start: 05/15/21 1000   Code Status:   Code Status: Full Code  Family Communication: No family  member present at bedside- attempt will be made to update her mother today The above findings and plan of care has been discussed with patient (and family)  in detail,  they expressed understanding and agreement of above. -Advance care planning has been discussed.   Admission status:   Status is: Inpatient Remains inpatient appropriate because: Requiring aggressive treatment for sepsis, IV fluid IV antibiotics,    Consults: infectious disease at [facility] has been remotely consulted  Disposition: From home  Anticipating to be discharged back home in next 2-3 days with home health       Procedures:   No admission procedures for hospital encounter.   Antimicrobials:  Anti-infectives (From admission, onward)    Start     Dose/Rate Route Frequency Ordered Stop   05/15/21 1800  cefTRIAXone (ROCEPHIN) 2 g in sodium chloride 0.9 % 100 mL IVPB        2 g 200 mL/hr over 30 Minutes Intravenous Every 24 hours 05/15/21 1224     05/15/21 1000  ceFEPIme (MAXIPIME) 2 g in sodium chloride 0.9 % 100 mL IVPB  Status:  Discontinued        2 g 200 mL/hr over 30 Minutes Intravenous Every 8 hours 05/15/21 0902 05/15/21 1224   05/14/21 1800  ceFEPIme (MAXIPIME) 2 g in sodium chloride 0.9 % 100 mL  IVPB  Status:  Discontinued        2 g 200 mL/hr over 30 Minutes Intravenous Every 12 hours 05/14/21 1652 05/15/21 0902   05/14/21 1415  cefTRIAXone (ROCEPHIN) 2 g in sodium chloride 0.9 % 100 mL IVPB  Status:  Discontinued        2 g 200 mL/hr over 30 Minutes Intravenous Every 24 hours 05/14/21 1407 05/14/21 1652        Medication:   Chlorhexidine Gluconate Cloth  6 each Topical Daily   enoxaparin (LOVENOX) injection  40 mg Subcutaneous Q24H   lactobacillus  1 g Oral TID WC   midodrine  10 mg Oral TID WC   pantoprazole  40 mg Oral Daily    acetaminophen **OR** acetaminophen, alum & mag hydroxide-simeth, nitroGLYCERIN, traMADol   Objective:   Vitals:   05/17/21 0730 05/17/21 0800  05/17/21 0830 05/17/21 0900  BP: 111/78   116/76  Pulse: 85 83 76 72  Resp: _0 Temp:  98.8 F (37.1 C)    TempSrc:  Rectal    SpO2: 99% 100% 100% 100%  Weight:      Height:        Intake/Output Summary (Last 24 hours) at 05/17/2021 0956 Last data filed at 05/17/2021 0400 Gross per 24 hour  Intake 298.34 ml  Output 3250 ml  Net -2951.66 ml   Filed Weights   05/14/21 1030 05/15/21 0500 05/17/21 0400  Weight: 81.6 kg 65.7 kg 69.6 kg     Examination:      Physical Exam:   General:  Alert, oriented, cooperative, no distress;   HEENT:  Normocephalic, PERRL, otherwise with in Normal limits   Neuro:  CNII-XII intact. , normal motor and sensation, reflexes intact   Lungs:   Clear to auscultation BL, Respirations unlabored, no wheezes / crackles  Cardio:    S1/S2, RRR, No murmure, No Rubs or Gallops   Abdomen:   Soft, non-tender, bowel sounds active all four quadrants,  no guarding or peritoneal signs.  Muscular skeletal:  Limited exam -paraplegic, normal strength in upper extremities Reduce motor and sensation lower extremities bilaterally symmetric 2+ pulses,  symmetric, +2  pitting edema  Skin:  Dry, warm to touch, negative for any Rashes,  Wounds: Please see nursing documentation  Pressure Injury 03/08/21 Sacrum Medial Stage 2 -  Partial thickness loss of dermis presenting as a shallow open injury with a red, pink wound bed without slough. 1 by 1 stage 2 area (Active)  03/08/21 0913  Location: Sacrum  Location Orientation: Medial  Staging: Stage 2 -  Partial thickness loss of dermis presenting as a shallow open injury with a red, pink wound bed without slough.  Wound Description (Comments): 1 by 1 stage 2 area  Present on Admission: Yes            ------------------------------------------------------------------------------------------------------------------------------------------    LABs:  CBC Latest Ref Rng & Units 05/17/2021 05/16/2021 05/15/2021  WBC  4.0 - 10.5 K/uL 4.5 8.7 25.1(H)  Hemoglobin 12.0 - 15.0 g/dL 8.9(L) 10.7(L) 10.1(L)  Hematocrit 36.0 - 46.0 % 27.7(L) 34.1(L) 30.8(L)  Platelets 150 - 400 K/uL 144(L) 145(L) 163   CMP Latest Ref Rng & Units 05/17/2021 05/16/2021 05/15/2021  Glucose 70 - 99 mg/dL 91 81 106(H)  BUN 6 - 20 mg/dL _1 Creatinine 0.44 - 1.00 mg/dL 0.60 0.72 0.74  Sodium 135 - 145 mmol/L 136 136 137  Potassium 3.5 - 5.1 mmol/L 3.1(L) 3.5 3.4(L)  Chloride 98 - 111 mmol/L 103 106 107  CO2 22 - 32 mmol/L _0 Calcium 8.9 - 10.3 mg/dL 7.7(L) 8.3(L) 8.2(L)  Total Protein 6.5 - 8.1 g/dL 5.7(L) 6.3(L) -  Total Bilirubin 0.3 - 1.2 mg/dL 0.2(L) 0.3 -  Alkaline Phos 38 - 126 U/L 114 60 -  AST 15 - 41 U/L 140(H) 35 -  ALT 0 - 44 U/L 125(H) 25 -       Micro Results Pending cultures:  urine cultures, blood cultures Influenza A/B/SARS-CoV-2 all negative   Radiology Reports DG CHEST PORT 1 VIEW  Result Date: 05/16/2021 CLINICAL DATA:  Chest pain EXAM: PORTABLE CHEST 1 VIEW COMPARISON:  05/14/2021 FINDINGS: New patchy infiltrates are seen in both lower lung fields, more so on the right side. Costophrenic angles are clear. There is no pneumothorax. IMPRESSION: There are new patchy infiltrates in both lower lung fields, more so on the right side suggesting multifocal pneumonia. Electronically Signed   By: Elmer Picker M.D.   On: 05/16/2021 12:35    SIGNED: Deatra James, MD, FHM. [facility],  Pager (please use [email or web address] to page/text) Please use Epic Secure Chat for non-urgent communication (7AM-7PM)  > 66 minutes of critical care time was spent in evaluating the patient, reviewing all medical records, electronic records, discussing plan of care with the patient ICU nursing staff ID consult --stabilizing the patient in ICU setting  if 7PM-7AM, please contact night-coverage [email or web address], 05/17/2021, 9:56 AM

## 2021-05-17 NOTE — Progress Notes (Addendum)
Pharmacy Antibiotic Note  Melissa West is a 31 y.o. female admitted on 05/14/2021 with Bacteremia/UTI.  Pharmacy has been consulted for cefepime dosing. E. Coli bacteremia and UCX with morganella. Underlying sepsis is improving but unclear which maybe causing her sepsis. Abx changed back to cefepime  Plan: Cefepime 2000 mg IV every 8 hours. Monitor labs, c/s, and patient improvement.  Height: 5\' 3"  (160 cm) Weight: 69.6 kg (153 lb 7 oz) IBW/kg (Calculated) : 52.4  Temp (24hrs), Avg:99.6 F (37.6 C), Min:98.6 F (37 C), Max:100.9 F (38.3 C)  Recent Labs  Lab 05/13/21 1306 05/14/21 1141 05/14/21 1425 05/14/21 1806 05/14/21 2014 05/15/21 0415 05/15/21 0657 05/16/21 0420 05/17/21 0626  WBC 6.9 47.7*  --   --   --  25.1*  --  8.7 4.5  CREATININE 0.77 1.48*  --   --   --  0.74  --  0.72 0.60  LATICACIDVEN  --  1.9 3.0* 6.3* 3.1*  --  1.0  --   --      Estimated Creatinine Clearance: 94.5 mL/min (by C-G formula based on SCr of 0.6 mg/dL).    No Known Allergies  Antimicrobials this admission: Cefepime 2/2 >> 2/3 restarted 2/5 CTX 2/2 restarted 2/3>>2/5  Microbiology results: 2/5 BCX: pending 2/2 BCx: BCID  E.Coli S- ceftriaxone R-cipro 2/2 UCx: >100K CFU/ml Morganella morganii S- cipro gent, imipenem , zosyn     Proteus 30K CFU/ml S- ceftriaxone 1/26 Ucx: e. coli resistant to cipro, S- ceftriaxone 2/2 MRSA PCR is negative   Thank you for allowing pharmacy to be a part of this patients care.  2/26, BS Melissa West, Loura Back Clinical Pharmacist Pager [phone] 05/17/2021 2:43 PM

## 2021-05-18 ENCOUNTER — Inpatient Hospital Stay (HOSPITAL_COMMUNITY): Payer: Medicare HMO

## 2021-05-18 DIAGNOSIS — A419 Sepsis, unspecified organism: Secondary | ICD-10-CM | POA: Diagnosis not present

## 2021-05-18 DIAGNOSIS — I959 Hypotension, unspecified: Secondary | ICD-10-CM | POA: Diagnosis not present

## 2021-05-18 DIAGNOSIS — G35 Multiple sclerosis: Secondary | ICD-10-CM | POA: Diagnosis not present

## 2021-05-18 DIAGNOSIS — Z993 Dependence on wheelchair: Secondary | ICD-10-CM

## 2021-05-18 DIAGNOSIS — R7881 Bacteremia: Secondary | ICD-10-CM | POA: Diagnosis not present

## 2021-05-18 DIAGNOSIS — J90 Pleural effusion, not elsewhere classified: Secondary | ICD-10-CM | POA: Diagnosis not present

## 2021-05-18 DIAGNOSIS — K8020 Calculus of gallbladder without cholecystitis without obstruction: Secondary | ICD-10-CM | POA: Diagnosis not present

## 2021-05-18 DIAGNOSIS — N179 Acute kidney failure, unspecified: Secondary | ICD-10-CM

## 2021-05-18 DIAGNOSIS — M542 Cervicalgia: Secondary | ICD-10-CM

## 2021-05-18 DIAGNOSIS — K7689 Other specified diseases of liver: Secondary | ICD-10-CM | POA: Diagnosis not present

## 2021-05-18 DIAGNOSIS — R338 Other retention of urine: Secondary | ICD-10-CM | POA: Diagnosis not present

## 2021-05-18 DIAGNOSIS — N390 Urinary tract infection, site not specified: Secondary | ICD-10-CM

## 2021-05-18 DIAGNOSIS — A4151 Sepsis due to Escherichia coli [E. coli]: Secondary | ICD-10-CM

## 2021-05-18 LAB — CBC WITH DIFFERENTIAL/PLATELET
Abs Immature Granulocytes: 0.04 10*3/uL (ref 0.00–0.07)
Basophils Absolute: 0 10*3/uL (ref 0.0–0.1)
Basophils Relative: 0 %
Eosinophils Absolute: 0 10*3/uL (ref 0.0–0.5)
Eosinophils Relative: 1 %
HCT: 29.9 % — ABNORMAL LOW (ref 36.0–46.0)
Hemoglobin: 9.4 g/dL — ABNORMAL LOW (ref 12.0–15.0)
Immature Granulocytes: 1 %
Lymphocytes Relative: 25 %
Lymphs Abs: 1.5 10*3/uL (ref 0.7–4.0)
MCH: 27.6 pg (ref 26.0–34.0)
MCHC: 31.4 g/dL (ref 30.0–36.0)
MCV: 87.7 fL (ref 80.0–100.0)
Monocytes Absolute: 0.7 10*3/uL (ref 0.1–1.0)
Monocytes Relative: 11 %
Neutro Abs: 3.8 10*3/uL (ref 1.7–7.7)
Neutrophils Relative %: 62 %
Platelets: 172 10*3/uL (ref 150–400)
RBC: 3.41 MIL/uL — ABNORMAL LOW (ref 3.87–5.11)
RDW: 15 % (ref 11.5–15.5)
WBC: 6.1 10*3/uL (ref 4.0–10.5)
nRBC: 0 % (ref 0.0–0.2)

## 2021-05-18 LAB — MAGNESIUM: Magnesium: 1.5 mg/dL — ABNORMAL LOW (ref 1.7–2.4)

## 2021-05-18 LAB — COMPREHENSIVE METABOLIC PANEL
ALT: 110 U/L — ABNORMAL HIGH (ref 0–44)
AST: 76 U/L — ABNORMAL HIGH (ref 15–41)
Albumin: 2.4 g/dL — ABNORMAL LOW (ref 3.5–5.0)
Alkaline Phosphatase: 140 U/L — ABNORMAL HIGH (ref 38–126)
Anion gap: 6 (ref 5–15)
BUN: 8 mg/dL (ref 6–20)
CO2: 26 mmol/L (ref 22–32)
Calcium: 7.8 mg/dL — ABNORMAL LOW (ref 8.9–10.3)
Chloride: 105 mmol/L (ref 98–111)
Creatinine, Ser: 0.63 mg/dL (ref 0.44–1.00)
GFR, Estimated: >60 mL/min (ref >60)
Glucose, Bld: 87 mg/dL (ref 70–99)
Potassium: 3.2 mmol/L — ABNORMAL LOW (ref 3.5–5.1)
Sodium: 137 mmol/L (ref 135–145)
Total Bilirubin: 0.2 mg/dL — ABNORMAL LOW (ref 0.3–1.2)
Total Protein: 6.1 g/dL — ABNORMAL LOW (ref 6.5–8.1)

## 2021-05-18 LAB — PREALBUMIN: Prealbumin: 5.8 mg/dL — ABNORMAL LOW (ref 18–38)

## 2021-05-18 MED ORDER — MAGNESIUM SULFATE 2 GM/50ML IV SOLN
2.0000 g | Freq: Once | INTRAVENOUS | Status: AC
  Administered 2021-05-18: 2 g via INTRAVENOUS
  Filled 2021-05-18: qty 50

## 2021-05-18 MED ORDER — POLYVINYL ALCOHOL 1.4 % OP SOLN
1.0000 [drp] | OPHTHALMIC | Status: DC | PRN
  Administered 2021-05-18: 1 [drp] via OPHTHALMIC
  Filled 2021-05-18: qty 15

## 2021-05-18 MED ORDER — IOHEXOL 300 MG/ML  SOLN
100.0000 mL | Freq: Once | INTRAMUSCULAR | Status: AC | PRN
  Administered 2021-05-18: 100 mL via INTRAVENOUS

## 2021-05-18 MED ORDER — POTASSIUM CHLORIDE CRYS ER 20 MEQ PO TBCR
40.0000 meq | EXTENDED_RELEASE_TABLET | Freq: Once | ORAL | Status: AC
  Administered 2021-05-18: 40 meq via ORAL
  Filled 2021-05-18: qty 2

## 2021-05-18 NOTE — Progress Notes (Signed)
Cooling blanket on monitor only. Temperature noted to be 101.2 rectally. Cooling blanket turned back on to auto control. All other vitals are stable at this time.

## 2021-05-18 NOTE — Progress Notes (Signed)
OT Cancellation Note  Patient Details Name: Melissa West MRN: [id number] DOB: 1990-09-05   Cancelled Treatment:    Reason Eval/Treat Not Completed: OT screened, no needs identified, will sign off. Patient functioning at baseline for functional mobility.  Patient is non-ambulatory and requires 2 person total assist for tranfers at home. She receives assistance for all mobility and ADL tasks from a home health aide. PT has already evaluated patient and recommended family contact hoyer lift vendor to provide them with education on it's use. They have a hoyer lift but do not use it due to no education provided when received.     Limmie Patricia, OTR/L,CBIS  [phone]  05/18/2021, 8:25 AM

## 2021-05-18 NOTE — Progress Notes (Signed)
PROGRESS NOTE    Patient: Melissa West                            PCP: Celene Squibb, MD                    DOB: [birth year]            DOA: 05/14/2021 HAL:[id number]             DOS: 05/18/2021, 1:02 PM   LOS: 4 days   Date of Service: The patient was seen and examined on 05/18/2021  Subjective:   The patient was seen and examined this morning, stable no acute distress complaining of mild headache but denies any chest pain or shortness of breath Hemodynamically stable Started on midodrine overnight BP 114/67  this morning   Brief Narrative:    Per HPI:  Melissa West  is a 31 y.o. 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 -ED patient was noted to be hypotensive, with significant leukocytosis at 47, repeat lactic acid was elevated at 3, her UA was strongly positive, Foley catheter was exchanged in ED, she was started empirically on vancomycin and Zosyn, hypotension responded to fluid boluses, as well creatinine elevated at 1.47  ED: Blood pressure (!) 81/56, pulse (!) 106, temperature 100.2 F (37.9 C), temperature source Oral, resp. rate (!) 21, height 5' 3" (1.6 m), weight 81.6 kg, SpO2 100 %. WBC: 47.7   BMP BUN 32 creatinine 1.48 UA: Negative nitrite, large leukocyte esterase, WBCs    Assessment & Plan:   Active Problems:   Sepsis secondary to UTI ([place])   AKI (acute kidney injury) ([place])   Bacteremia   Hypotension   Chest pain   MS (multiple sclerosis) ([place])   Wheelchair dependence   Leukocytosis   Acute urinary retention   Pressure  injury of skin     Assessment and Plan: Sepsis secondary to UTI ([place]) - Sepsis -bacteremia -Due to UTI due to indwelling catheter, clogged - Urine culture growing > 100 k Morganella, 30 K Proteus -Blood cultures from 05-14-21 grew E. Coli -Due to sensitivity antibiotic and switch to cefepime  -Much improved sepsis etiology with exception of blood pressure -Hypertension-multifactorial autonomic versus sepsis -Sepsis physiology has resolved, midodrine has been added BP has been stable  --Off pressors, On Midodrine   - Patient met sepsis criteria on admission provided leukocytosis hypotensive lactic acidosis hypotensive and tachycardic -Patient was started on sepsis pathway -Status post IV fluid resuscitation, -Was started on V antibiotic vancomycin and Zosyn >>  Cefepime >>> IV Rocephin -ID consulted, recommended IV Rocephin   -WBC of 25.1 >>> 8.7 >> 4.5 >>6.1  -Lactic acid 6.3, 3.1, 1.0  -Creatinine 1.48 >> 0.74 >>0.72 >>0.63  -Infectious disease Dr. Graylon Good from [facility] has been consulted-  appreciate F/ups    Bacteremia- (present on admission) - Blood cultures growing E. coli and Enterobacter from 05-14-21 -Repeated blood cultures 05/17/2021 >> no growth to date -Blood culture repeat >> no growth to date -Urine culture pending >> Morganella, Proteus, -  Antibiotic as above Rocephin 2 g daily >>>> switch to Cefepime 05/17/2021 -ID consulted-appreciate input and follow  AKI (acute kidney injury) ([place])- (present on admission) - Resolved, monitoring - Likely due to sepsis, UTI, urinary retention -Foley catheter on initial presentation thought to be clogged, was replaced -Status post IV fluid resuscitation -Creatinine 1.47 >> improved to 0.74  >>>0.72 >> 0.60  Chest pain - Brief episode-resolved -EKG reveal a specific ST elevation depression -Aspirin, nitroglycerin, as needed morphine ordered -Maalox, Protonix ordered -We will monitor closely -Obtaining chest x-ray -Patient  has received aggressive IV fluid resuscitation for sepsis, 40 mg of IV Lasix will be given  Hypotension- (present on admission) -Blood pressure remained soft -Off pressors-Levophed -Restarting midodrine 10 mg p.o. 3 times daily started overnight again 05/17/2021 -We will continue to monitor -Hypotensive likely related to autonomic system due to advanced MS Was exacerbated by sepsis  - On admission patient was found hypotensive likely due to sepsis -Started on aggressive IV fluid resuscitation LR, Levophed was initiated   MS (multiple sclerosis) ([place])- (present on admission) - Denies any double vision, denies any worsening weakness in her upper extremity based on lower extremity weakness at baseline, hemiplegic wheelchair dependent --She is following with Dr. Public librarian in [place] -Stable   Wheelchair dependence - Due to MS -Stable  Leukocytosis- (present on admission) - Due to sepsis, -Resolved  Acute urinary retention- (present on admission) - Due to [place], neurogenic bladder -Chronic indwelling urine catheter, was changed in ER overnight 05/15/2021 -Clear urine with good output noted   Pressure injury of skin- (present on admission) - Per nurses evaluation, continue nursing care, wound care -Continue nursing care, including rotating every 2 hours      Skin Assessment: I have examined the patient's skin and I agree with the wound assessment as performed by wound care team As outlined belowe: Pressure Injury 03/08/21 Sacrum Medial Stage 2 -  Partial thickness loss of dermis presenting as a shallow open injury with a red, pink wound bed without slough. 1 by 1 stage 2 area (Active)  03/08/21 0913  Location: Sacrum  Location Orientation: Medial  Staging: Stage 2 -  Partial thickness loss of dermis presenting as a shallow open injury with a red, pink wound bed without slough.  Wound Description (Comments): 1 by 1 stage 2 area  Present on Admission: Yes      --------------------------------------------------------------------------------------------------------------------------- Cultures; Blood Cultures x 2 >> NGT Urine Culture  >>> NGT   ------------------------------------------------------------------------------------------------------------------------------------------------  DVT prophylaxis:  enoxaparin (LOVENOX) injection 40 mg Start: 05/15/21 1000   Code Status:   Code Status: Full Code  Family Communication: Discussed with her mother over the phone The above findings and plan of care has been discussed with patient (and family)  in detail,  they expressed understanding and agreement of above. -Advance care planning has been discussed.   Admission status:   Status is: Inpatient Remains inpatient appropriate because: Requiring aggressive treatment for sepsis, IV fluid IV antibiotics,    Consults: infectious disease at [facility] has been remotely consulted  Disposition: From home  Anticipating to be discharged back home with home health in next 2-3 days      Procedures:   No admission procedures for hospital encounter.   Antimicrobials:  Anti-infectives (From admission, onward)    Start     Dose/Rate Route Frequency Ordered Stop   05/17/21 1545  ceFEPIme (MAXIPIME) 2 g in sodium chloride 0.9 % 100 mL IVPB        2  g 200 mL/hr over 30 Minutes Intravenous Every 8 hours 05/17/21 1448     05/15/21 1800  cefTRIAXone (ROCEPHIN) 2 g in sodium chloride 0.9 % 100 mL IVPB  Status:  Discontinued        2 g 200 mL/hr over 30 Minutes Intravenous Every 24 hours 05/15/21 1224 05/17/21 1435   05/15/21 1000  ceFEPIme (MAXIPIME) 2 g in sodium chloride 0.9 % 100 mL IVPB  Status:  Discontinued        2 g 200 mL/hr over 30 Minutes Intravenous Every 8 hours 05/15/21 0902 05/15/21 1224   05/14/21 1800  ceFEPIme (MAXIPIME) 2 g in sodium chloride 0.9 % 100 mL IVPB  Status:  Discontinued        2 g 200 mL/hr over 30 Minutes  Intravenous Every 12 hours 05/14/21 1652 05/15/21 0902   05/14/21 1415  cefTRIAXone (ROCEPHIN) 2 g in sodium chloride 0.9 % 100 mL IVPB  Status:  Discontinued        2 g 200 mL/hr over 30 Minutes Intravenous Every 24 hours 05/14/21 1407 05/14/21 1652        Medication:   Chlorhexidine Gluconate Cloth  6 each Topical Daily   enoxaparin (LOVENOX) injection  40 mg Subcutaneous Q24H   lactobacillus  1 g Oral TID WC   midodrine  10 mg Oral TID WC   pantoprazole  40 mg Oral Daily    acetaminophen **OR** acetaminophen, alum & mag hydroxide-simeth, nitroGLYCERIN, traMADol   Objective:   Vitals:   05/18/21 0818 05/18/21 1100 05/18/21 1130 05/18/21 1200  BP:  115/73  118/81  Pulse: 88 66 80 80  Resp: _0 Temp: 99.1 F (37.3 C)  99.3 F (37.4 C)   TempSrc: Rectal  Rectal   SpO2: 96% 96% 94% 97%  Weight:      Height:        Intake/Output Summary (Last 24 hours) at 05/18/2021 1302 Last data filed at 05/18/2021 0901 Gross per 24 hour  Intake 236.67 ml  Output 1950 ml  Net -1713.33 ml   Filed Weights   05/14/21 1030 05/15/21 0500 05/17/21 0400  Weight: 81.6 kg 65.7 kg 69.6 kg     Examination:     General:  Alert, oriented, cooperative, no distress;   HEENT:  Normocephalic, PERRL, otherwise with in Normal limits   Neuro:  CNII-XII intact. , normal motor and sensation, reflexes intact   Lungs:   Clear to auscultation BL, Respirations unlabored, no wheezes / crackles  Cardio:    S1/S2, RRR, No murmure, No Rubs or Gallops   Abdomen:   Soft, non-tender, bowel sounds active all four quadrants,  no guarding or peritoneal signs.  Muscular skeletal:  Paraplegic Limited exam - in bed, able to move upper extremities, diminished lower extremity strength --with mild contracture 2+ pulses,  symmetric, No pitting edema  Skin:  Dry, warm to touch, negative for any Rashes,  Wounds: Please see nursing documentation  Pressure Injury 03/08/21 Sacrum Medial Stage 2 -  Partial  thickness loss of dermis presenting as a shallow open injury with a red, pink wound bed without slough. 1 by 1 stage 2 area (Active)  03/08/21 0913  Location: Sacrum  Location Orientation: Medial  Staging: Stage 2 -  Partial thickness loss of dermis presenting as a shallow open injury with a red, pink wound bed without slough.  Wound Description (Comments): 1 by 1 stage 2 area  Present on Admission: Yes    -Indwelling  catheter was exchanged on admission in ED         ------------------------------------------------------------------------------------------------------------------------------------------    LABs:  CBC Latest Ref Rng & Units 05/18/2021 05/17/2021 05/16/2021  WBC 4.0 - 10.5 K/uL 6.1 4.5 8.7  Hemoglobin 12.0 - 15.0 g/dL 9.4(L) 8.9(L) 10.7(L)  Hematocrit 36.0 - 46.0 % 29.9(L) 27.7(L) 34.1(L)  Platelets 150 - 400 K/uL 172 144(L) 145(L)   CMP Latest Ref Rng & Units 05/18/2021 05/17/2021 05/16/2021  Glucose 70 - 99 mg/dL 87 91 81  BUN 6 - 20 mg/dL _0 Creatinine 0.44 - 1.00 mg/dL 0.63 0.60 0.72  Sodium 135 - 145 mmol/L 137 136 136  Potassium 3.5 - 5.1 mmol/L 3.2(L) 3.1(L) 3.5  Chloride 98 - 111 mmol/L 105 103 106  CO2 22 - 32 mmol/L _1 Calcium 8.9 - 10.3 mg/dL 7.8(L) 7.7(L) 8.3(L)  Total Protein 6.5 - 8.1 g/dL 6.1(L) 5.7(L) 6.3(L)  Total Bilirubin 0.3 - 1.2 mg/dL 0.2(L) 0.2(L) 0.3  Alkaline Phos 38 - 126 U/L 140(H) 114 60  AST 15 - 41 U/L 76(H) 140(H) 35  ALT 0 - 44 U/L 110(H) 125(H) 25       Micro Results Pending cultures:  urine cultures, blood cultures Influenza A/B/SARS-CoV-2 all negative   Radiology Reports No results found.  SIGNED: Deatra James, MD, FHM. [facility],  Pager (please use [email or web address] to page/text) Please use Epic Secure Chat for non-urgent communication (7AM-7PM)  > 66 minutes of critical care time was spent in evaluating the patient, reviewing all medical records, electronic records, discussing plan of care with the  patient ICU nursing staff ID consult --stabilizing the patient in ICU setting  if 7PM-7AM, please contact night-coverage [email or web address], 05/18/2021, 1:02 PM

## 2021-05-18 NOTE — TOC Progression Note (Signed)
Transition of Care [facility]) - Progression Note    Patient Details  Name: Melissa West MRN: [id number] Date of Birth: Jul 29, 1990  Transition of Care [facility]) CM/SW Contact  Karn Cassis, [place] Phone Number: 05/18/2021, 1:23 PM  Clinical Narrative:  Per MD, will need home health services- RN at minimum. LCSW discussed with pt who is agreeable. Misty Stanley with [facility] reviewing. TOC will continue to follow.        Barriers to Discharge: Continued Medical Work up  Expected Discharge Plan and Services                                                 Social Determinants of Health (SDOH) Interventions    Readmission Risk Interventions Readmission Risk Prevention Plan 03/11/2021 03/10/2021  Post Dischage Appt Complete -  Medication Screening Complete Complete  Transportation Screening Complete Complete  Some recent data might be hidden

## 2021-05-18 NOTE — Progress Notes (Signed)
Virtual Visit via Video Note  I connected with Melissa West on @TODAY @ at  by a video enabled telemedicine application and verified that I am speaking with the correct person using two identifiers.  Location: Patient: ICU bed 12 Provider: [facility]   I discussed the limitations of evaluation and management by telemedicine and the availability of in person appointments. The patient expressed understanding and agreed to proceed.     Subjective:  She is complaining of pain on the back of her head   Antibiotics:  Anti-infectives (From admission, onward)    Start     Dose/Rate Route Frequency Ordered Stop   05/17/21 1545  ceFEPIme (MAXIPIME) 2 g in sodium chloride 0.9 % 100 mL IVPB        2 g 200 mL/hr over 30 Minutes Intravenous Every 8 hours 05/17/21 1448     05/15/21 1800  cefTRIAXone (ROCEPHIN) 2 g in sodium chloride 0.9 % 100 mL IVPB  Status:  Discontinued        2 g 200 mL/hr over 30 Minutes Intravenous Every 24 hours 05/15/21 1224 05/17/21 1435   05/15/21 1000  ceFEPIme (MAXIPIME) 2 g in sodium chloride 0.9 % 100 mL IVPB  Status:  Discontinued        2 g 200 mL/hr over 30 Minutes Intravenous Every 8 hours 05/15/21 0902 05/15/21 1224   05/14/21 1800  ceFEPIme (MAXIPIME) 2 g in sodium chloride 0.9 % 100 mL IVPB  Status:  Discontinued        2 g 200 mL/hr over 30 Minutes Intravenous Every 12 hours 05/14/21 1652 05/15/21 0902   05/14/21 1415  cefTRIAXone (ROCEPHIN) 2 g in sodium chloride 0.9 % 100 mL IVPB  Status:  Discontinued        2 g 200 mL/hr over 30 Minutes Intravenous Every 24 hours 05/14/21 1407 05/14/21 1652       Medications: Scheduled Meds:  Chlorhexidine Gluconate Cloth  6 each Topical Daily   enoxaparin (LOVENOX) injection  40 mg Subcutaneous Q24H   lactobacillus  1 g Oral TID WC   midodrine  10 mg Oral TID WC   pantoprazole  40 mg Oral Daily   Continuous Infusions:  sodium chloride Stopped (05/16/21 0802)   ceFEPime (MAXIPIME) IV 2 g  (05/18/21 1133)   magnesium sulfate bolus IVPB 2 g (05/18/21 1320)   PRN Meds:.acetaminophen **OR** acetaminophen, alum & mag hydroxide-simeth, nitroGLYCERIN, traMADol    Objective: Weight change:   Intake/Output Summary (Last 24 hours) at 05/18/2021 1323 Last data filed at 05/18/2021 0901 Gross per 24 hour  Intake 236.67 ml  Output 1950 ml  Net -1713.33 ml   Blood pressure 118/81, pulse 80, temperature 99.3 F (37.4 C), temperature source Rectal, resp. rate 15, height 5\' 3"  (1.6 m), weight 69.6 kg, SpO2 97 %. Temp:  [99.1 F (37.3 C)-101.2 F (38.4 C)] 99.3 F (37.4 C) (02/06 1130) Pulse Rate:  [57-91] 80 (02/06 1200) Resp:  [13-21] 15 (02/06 1200) BP: (100-137)/(53-81) 118/81 (02/06 1200) SpO2:  [94 %-100 %] 97 % (02/06 1200)  Physical Exam: Physical Exam Constitutional:      General: She is not in acute distress.    Appearance: She is well-developed. She is obese.  HENT:     Head: Normocephalic and atraumatic.  Cardiovascular:     Rate and Rhythm: Normal rate and regular rhythm.  Pulmonary:     Effort: Pulmonary effort is normal. No respiratory distress.     Breath sounds: No  wheezing.  Abdominal:     General: There is no distension.  Skin:    General: Skin is warm and dry.  Neurological:     Mental Status: She is alert and oriented to person, place, and time.  Psychiatric:        Mood and Affect: Mood normal.        Behavior: Behavior normal.        Thought Content: Thought content normal.        Judgment: Judgment normal.     CBC:    BMET Recent Labs    05/17/21 0626 05/18/21 0513  NA 136 137  K 3.1* 3.2*  CL 103 105  CO2 27 26  GLUCOSE 91 87  BUN 8 8  CREATININE 0.60 0.63  CALCIUM 7.7* 7.8*     Liver Panel  Recent Labs    05/17/21 0626 05/18/21 0513  PROT 5.7* 6.1*  ALBUMIN 2.2* 2.4*  AST 140* 76*  ALT 125* 110*  ALKPHOS 114 140*  BILITOT 0.2* 0.2*       Sedimentation Rate No results for input(s): ESRSEDRATE in the last 72  hours. C-Reactive Protein No results for input(s): CRP in the last 72 hours.  Micro Results: Recent Results (from the past 720 hour(s))  Urine Culture     Status: Abnormal   Collection Time: 05/07/21  2:29 AM   Specimen: Urine, Catheterized  Result Value Ref Range Status   Specimen Description   Final    URINE, CATHETERIZED Performed at [facility], [address], [place], [place] [address]    Special Requests   Final    NONE Performed at [facility], [address] [place], [place] [address]    Culture 50,000 COLONIES/mL ESCHERICHIA COLI (A)  Final   Report Status 05/09/2021 FINAL  Final   Organism ID, Bacteria ESCHERICHIA COLI (A)  Final      Susceptibility   Escherichia coli - MIC*    AMPICILLIN 16 INTERMEDIATE Intermediate     CEFAZOLIN <=4 SENSITIVE Sensitive     CEFEPIME <=0.12 SENSITIVE Sensitive     CEFTRIAXONE <=0.25 SENSITIVE Sensitive     CIPROFLOXACIN >=4 RESISTANT Resistant     GENTAMICIN <=1 SENSITIVE Sensitive     IMIPENEM <=0.25 SENSITIVE Sensitive     NITROFURANTOIN <=16 SENSITIVE Sensitive     TRIMETH/SULFA <=20 SENSITIVE Sensitive     AMPICILLIN/SULBACTAM 8 SENSITIVE Sensitive     PIP/TAZO <=4 SENSITIVE Sensitive     * 50,000 COLONIES/mL ESCHERICHIA COLI  Blood Culture (routine x 2)     Status: Abnormal   Collection Time: 05/14/21  2:09 PM   Specimen: BLOOD LEFT HAND  Result Value Ref Range Status   Specimen Description   Final    BLOOD LEFT HAND Performed at [facility], [address] [place], [place] [address]    Special Requests   Final    BOTTLES DRAWN AEROBIC AND ANAEROBIC Blood Culture results may not be optimal due to an inadequate volume of blood received in culture bottles Performed at [facility], [address], [place], [place] [address]    Culture  Setup Time   Final    GRAM NEGATIVE RODS IN BOTH AEROBIC AND ANAEROBIC BOTTLES Gram Stain Report Called to,Read Back By and Verified With: ALLEN,T@0559  BY MATTHEWS, B 2.3.23 CRITICAL  RESULT CALLED TO, READ BACK BY AND VERIFIED WITH: S HURTH PHARMD @1110  05/15/21 EB Performed at [place] [address], [place],  [address]  Culture ESCHERICHIA COLI (A)  Final   Report Status 05/17/2021 FINAL  Final   Organism ID, Bacteria ESCHERICHIA COLI  Final      Susceptibility   Escherichia coli - MIC*    AMPICILLIN 16 INTERMEDIATE Intermediate     CEFAZOLIN <=4 SENSITIVE Sensitive     CEFEPIME <=0.12 SENSITIVE Sensitive     CEFTAZIDIME <=1 SENSITIVE Sensitive     CEFTRIAXONE <=0.25 SENSITIVE Sensitive     CIPROFLOXACIN >=4 RESISTANT Resistant     GENTAMICIN <=1 SENSITIVE Sensitive     IMIPENEM <=0.25 SENSITIVE Sensitive     TRIMETH/SULFA <=20 SENSITIVE Sensitive     AMPICILLIN/SULBACTAM 8 SENSITIVE Sensitive     PIP/TAZO <=4 SENSITIVE Sensitive     * ESCHERICHIA COLI  Blood Culture ID Panel (Reflexed)     Status: Abnormal   Collection Time: 05/14/21  2:09 PM  Result Value Ref Range Status   Enterococcus faecalis NOT DETECTED NOT DETECTED Final   Enterococcus Faecium NOT DETECTED NOT DETECTED Final   Listeria monocytogenes NOT DETECTED NOT DETECTED Final   Staphylococcus species NOT DETECTED NOT DETECTED Final   Staphylococcus aureus (BCID) NOT DETECTED NOT DETECTED Final   Staphylococcus epidermidis NOT DETECTED NOT DETECTED Final   Staphylococcus lugdunensis NOT DETECTED NOT DETECTED Final   Streptococcus species NOT DETECTED NOT DETECTED Final   Streptococcus agalactiae NOT DETECTED NOT DETECTED Final   Streptococcus pneumoniae NOT DETECTED NOT DETECTED Final   Streptococcus pyogenes NOT DETECTED NOT DETECTED Final   A.calcoaceticus-baumannii NOT DETECTED NOT DETECTED Final   Bacteroides fragilis NOT DETECTED NOT DETECTED Final   Enterobacterales DETECTED (A) NOT DETECTED Final    Comment: Enterobacterales represent a large order of gram negative bacteria, not a single organism. CRITICAL RESULT CALLED TO, READ BACK BY AND VERIFIED WITH: S HURTH  PHARMD @1110  05/15/21 EB    Enterobacter cloacae complex NOT DETECTED NOT DETECTED Final   Escherichia coli DETECTED (A) NOT DETECTED Final    Comment: CRITICAL RESULT CALLED TO, READ BACK BY AND VERIFIED WITH: S HURTH PHARMD @1110  05/15/21 EB    Klebsiella aerogenes NOT DETECTED NOT DETECTED Final   Klebsiella oxytoca NOT DETECTED NOT DETECTED Final   Klebsiella pneumoniae NOT DETECTED NOT DETECTED Final   Proteus species NOT DETECTED NOT DETECTED Final   Salmonella species NOT DETECTED NOT DETECTED Final   Serratia marcescens NOT DETECTED NOT DETECTED Final   Haemophilus influenzae NOT DETECTED NOT DETECTED Final   Neisseria meningitidis NOT DETECTED NOT DETECTED Final   Pseudomonas aeruginosa NOT DETECTED NOT DETECTED Final   Stenotrophomonas maltophilia NOT DETECTED NOT DETECTED Final   Candida albicans NOT DETECTED NOT DETECTED Final   Candida auris NOT DETECTED NOT DETECTED Final   Candida glabrata NOT DETECTED NOT DETECTED Final   Candida krusei NOT DETECTED NOT DETECTED Final   Candida parapsilosis NOT DETECTED NOT DETECTED Final   Candida tropicalis NOT DETECTED NOT DETECTED Final   Cryptococcus neoformans/gattii NOT DETECTED NOT DETECTED Final   CTX-M ESBL NOT DETECTED NOT DETECTED Final   Carbapenem resistance IMP NOT DETECTED NOT DETECTED Final   Carbapenem resistance KPC NOT DETECTED NOT DETECTED Final   Carbapenem resistance NDM NOT DETECTED NOT DETECTED Final   Carbapenem resist OXA 48 LIKE NOT DETECTED NOT DETECTED Final   Carbapenem resistance VIM NOT DETECTED NOT DETECTED Final    Comment: Performed at [facility], [address], [place], [place] [address]  Blood Culture (routine x 2)     Status: Abnormal  Collection Time: 05/14/21  2:23 PM   Specimen: BLOOD RIGHT HAND  Result Value Ref Range Status   Specimen Description   Final    BLOOD RIGHT HAND Performed at [facility], [address] [place], [place] [address]    Special Requests   Final     BOTTLES DRAWN AEROBIC AND ANAEROBIC Blood Culture results may not be optimal due to an inadequate volume of blood received in culture bottles Performed at [facility], [address] [place], [place] [address]    Culture  Setup Time   Final    GRAM NEGATIVE RODS AEROBIC BOTTLE ONLY CRITICAL VALUE NOTED.  VALUE IS CONSISTENT WITH PREVIOUSLY REPORTED AND CALLED VALUE. Performed at [facility], [address] [place], [place] [address]    Culture (A)  Final    ESCHERICHIA COLI SUSCEPTIBILITIES PERFORMED ON PREVIOUS CULTURE WITHIN THE LAST 5 DAYS. Performed at [facility], [address] [place], [place] [address]    Report Status 05/17/2021 FINAL  Final  Resp Panel by RT-PCR (Flu A&B, Covid) Nasopharyngeal Swab     Status: None   Collection Time: 05/14/21  2:30 PM   Specimen: Nasopharyngeal Swab; Nasopharyngeal(NP) swabs in vial transport medium  Result Value Ref Range Status   SARS Coronavirus 2 by RT PCR NEGATIVE NEGATIVE Final    Comment: (NOTE) SARS-CoV-2 target nucleic acids are NOT DETECTED.  The SARS-CoV-2 RNA is generally detectable in upper respiratory specimens during the acute phase of infection. The lowest concentration of SARS-CoV-2 viral copies this assay can detect is 138 copies/mL. A negative result does not preclude SARS-Cov-2 infection and should not be used as the sole basis for treatment or other patient management decisions. A negative result may occur with  improper specimen collection/handling, submission of specimen other than nasopharyngeal swab, presence of viral mutation(s) within the areas targeted by this assay, and inadequate number of viral copies(<138 copies/mL). A negative result must be combined with clinical observations, patient history, and epidemiological information. The expected result is Negative.  Fact Sheet for Patients:  BloggerCourse.com  Fact Sheet for Healthcare Providers:   SeriousBroker.it  This test is no t yet approved or cleared by the [place] FDA and  has been authorized for detection and/or diagnosis of SARS-CoV-2 by FDA under an Emergency Use Authorization (EUA). This EUA will remain  in effect (meaning this test can be used) for the duration of the COVID-19 declaration under Section 564(b)(1) of the Act, 21 U.S.C.section 360bbb-3(b)(1), unless the authorization is terminated  or revoked sooner.       Influenza A by PCR NEGATIVE NEGATIVE Final   Influenza B by PCR NEGATIVE NEGATIVE Final    Comment: (NOTE) The Xpert Xpress SARS-CoV-2/FLU/RSV plus assay is intended as an aid in the diagnosis of influenza from Nasopharyngeal swab specimens and should not be used as a sole basis for treatment. Nasal washings and aspirates are unacceptable for Xpert Xpress SARS-CoV-2/FLU/RSV testing.  Fact Sheet for Patients: BloggerCourse.com  Fact Sheet for Healthcare Providers: SeriousBroker.it  This test is not yet approved or cleared by the [place] FDA and has been authorized for detection and/or diagnosis of SARS-CoV-2 by FDA under an Emergency Use Authorization (EUA). This EUA will remain in effect (meaning this test can be used) for the duration of the COVID-19 declaration under Section 564(b)(1) of the Act, 21 U.S.C. section 360bbb-3(b)(1), unless the authorization is terminated or revoked.  Performed at [facility], [address], [place], [place] [address]  Urine Culture     Status: Abnormal   Collection Time: 05/14/21  2:34 PM   Specimen: In/Out Cath Urine  Result Value Ref Range Status   Specimen Description   Final    IN/OUT CATH URINE Performed at [facility], [address] [place], [place] [address]    Special Requests   Final    NONE Performed at [facility], [address], [place], [place] [address]    Culture (A)  Final    >=100,000  COLONIES/mL MORGANELLA MORGANII 30,000 COLONIES/mL PROTEUS PENNERI    Report Status 05/17/2021 FINAL  Final   Organism ID, Bacteria MORGANELLA MORGANII (A)  Final   Organism ID, Bacteria PROTEUS PENNERI (A)  Final      Susceptibility   Morganella morganii - MIC*    AMPICILLIN >=32 RESISTANT Resistant     CEFAZOLIN >=64 RESISTANT Resistant     CIPROFLOXACIN <=0.25 SENSITIVE Sensitive     GENTAMICIN <=1 SENSITIVE Sensitive     IMIPENEM 2 SENSITIVE Sensitive     NITROFURANTOIN 128 RESISTANT Resistant     TRIMETH/SULFA <=20 SENSITIVE Sensitive     AMPICILLIN/SULBACTAM 16 INTERMEDIATE Intermediate     PIP/TAZO <=4 SENSITIVE Sensitive     * >=100,000 COLONIES/mL MORGANELLA MORGANII   Proteus penneri - MIC*    AMPICILLIN >=32 RESISTANT Resistant     CEFAZOLIN >=64 RESISTANT Resistant     CEFEPIME <=0.12 SENSITIVE Sensitive     CEFTRIAXONE <=0.25 SENSITIVE Sensitive     CIPROFLOXACIN <=0.25 SENSITIVE Sensitive     GENTAMICIN <=1 SENSITIVE Sensitive     IMIPENEM 2 SENSITIVE Sensitive     NITROFURANTOIN RESISTANT Resistant     TRIMETH/SULFA <=20 SENSITIVE Sensitive     AMPICILLIN/SULBACTAM 8 SENSITIVE Sensitive     PIP/TAZO <=4 SENSITIVE Sensitive     * 30,000 COLONIES/mL PROTEUS PENNERI  MRSA Next Gen by PCR, Nasal     Status: None   Collection Time: 05/14/21  6:00 PM   Specimen: Nasal Mucosa; Nasal Swab  Result Value Ref Range Status   MRSA by PCR Next Gen NOT DETECTED NOT DETECTED Final    Comment: (NOTE) The GeneXpert MRSA Assay (FDA approved for NASAL specimens only), is one component of a comprehensive MRSA colonization surveillance program. It is not intended to diagnose MRSA infection nor to guide or monitor treatment for MRSA infections. Test performance is not FDA approved in patients less than 48 years old. Performed at [facility], [address] [place], [place] [address]   Culture, blood (routine x 2)     Status: None (Preliminary result)   Collection Time: 05/17/21   8:04 AM   Specimen: BLOOD RIGHT HAND  Result Value Ref Range Status   Specimen Description   Final    BLOOD RIGHT HAND BOTTLES DRAWN AEROBIC AND ANAEROBIC   Special Requests   Final    Blood Culture results may not be optimal due to an excessive volume of blood received in culture bottles Performed at [facility], [address] [place], [place] [address]    Culture PENDING  Incomplete   Report Status PENDING  Incomplete  Culture, blood (routine x 2)     Status: None (Preliminary result)   Collection Time: 05/17/21  8:08 AM   Specimen: Left Antecubital; Blood  Result Value Ref Range Status   Specimen Description   Final    LEFT ANTECUBITAL BOTTLES DRAWN AEROBIC AND ANAEROBIC   Special Requests   Final    Blood Culture results may  not be optimal due to an excessive volume of blood received in culture bottles Performed at [facility], [address] [place], [place] [address]    Culture PENDING  Incomplete   Report Status PENDING  Incomplete    Studies/Results: No results found.    Assessment/Plan:  INTERVAL HISTORY:   Both blood cultures are now positive for E. coli   Active Problems:   MS (multiple sclerosis) (HCC)   Wheelchair dependence   AKI (acute kidney injury) ([place])   Leukocytosis   Acute urinary retention   Pressure injury of skin   Sepsis secondary to UTI ([place])   Hypotension   Chest pain   Bacteremia    Melissa West is a 31 y.o. female with relapsing remitting MS who is wheelchair-bound with neurogenic bladder chronic Foley catheter admitted for what was thought to be urosepsis is found to have E. coli bacteremia.  She has grown discordant organisms from urine and blood and was changed back to cefepime from ceftriaxone yesterday.  Repeat blood cultures have been taken.  #1 E coli bacteremia:  Source was presumed urinary but urine cultures are growing Morganella Proteus rather than E. coli.  This could be due to the urine culture being  contaminated or having been collected from the Foley catheter.  Her CT renal study showed no kidney stones and no obvious infection.  There were some gallstones but no evidence of cholecystitis or cholangitis.  For thoroughness I am ordering a CT of the abdomen pelvis with contrast to exclude an intra-abdominal abscess or process that could be causing the E. coli bacteremia.  Hopefully CT is without any significant pathology.  If this is the case we will plan on narrowing to target the E. coli and ultimately change her over to oral therapy.  #2 neck pain I presume this is due to her fevers she has no pathology on exam no cuts or lesions.  .I spent 37minutes with the patient including than 50% of the time in face to face counseling of the patient over video feed regarding work-up for her bacteremia personally CT renal study updated culture data CBC CMP along with review of medical records in preparation for the visit and during the visit and in coordination of her care.    LOS: 4 days   Alcide Evener 05/18/2021, 1:23 PM

## 2021-05-19 ENCOUNTER — Inpatient Hospital Stay (HOSPITAL_COMMUNITY): Payer: Medicare HMO

## 2021-05-19 DIAGNOSIS — N289 Disorder of kidney and ureter, unspecified: Secondary | ICD-10-CM

## 2021-05-19 DIAGNOSIS — R7881 Bacteremia: Secondary | ICD-10-CM

## 2021-05-19 DIAGNOSIS — Z993 Dependence on wheelchair: Secondary | ICD-10-CM

## 2021-05-19 DIAGNOSIS — A419 Sepsis, unspecified organism: Secondary | ICD-10-CM | POA: Diagnosis not present

## 2021-05-19 DIAGNOSIS — I959 Hypotension, unspecified: Secondary | ICD-10-CM

## 2021-05-19 DIAGNOSIS — R9389 Abnormal findings on diagnostic imaging of other specified body structures: Secondary | ICD-10-CM | POA: Diagnosis present

## 2021-05-19 DIAGNOSIS — K769 Liver disease, unspecified: Secondary | ICD-10-CM

## 2021-05-19 DIAGNOSIS — G35 Multiple sclerosis: Secondary | ICD-10-CM

## 2021-05-19 DIAGNOSIS — N390 Urinary tract infection, site not specified: Secondary | ICD-10-CM

## 2021-05-19 DIAGNOSIS — N179 Acute kidney failure, unspecified: Secondary | ICD-10-CM

## 2021-05-19 DIAGNOSIS — R1900 Intra-abdominal and pelvic swelling, mass and lump, unspecified site: Secondary | ICD-10-CM | POA: Diagnosis not present

## 2021-05-19 DIAGNOSIS — R0789 Other chest pain: Secondary | ICD-10-CM

## 2021-05-19 DIAGNOSIS — R338 Other retention of urine: Secondary | ICD-10-CM

## 2021-05-19 DIAGNOSIS — D72829 Elevated white blood cell count, unspecified: Secondary | ICD-10-CM

## 2021-05-19 DIAGNOSIS — N2889 Other specified disorders of kidney and ureter: Secondary | ICD-10-CM | POA: Diagnosis not present

## 2021-05-19 MED ORDER — CEFADROXIL 500 MG PO CAPS
1000.0000 mg | ORAL_CAPSULE | Freq: Two times a day (BID) | ORAL | Status: DC
  Administered 2021-05-19 – 2021-05-20 (×3): 1000 mg via ORAL
  Filled 2021-05-19 (×7): qty 2

## 2021-05-19 MED ORDER — GADOBUTROL 1 MMOL/ML IV SOLN
7.0000 mL | Freq: Once | INTRAVENOUS | Status: AC | PRN
  Administered 2021-05-19: 7 mL via INTRAVENOUS

## 2021-05-19 MED ORDER — LACTULOSE 10 GM/15ML PO SOLN
60.0000 g | Freq: Once | ORAL | Status: AC
  Administered 2021-05-19: 60 g via ORAL
  Filled 2021-05-19: qty 90

## 2021-05-19 MED ORDER — BISACODYL 10 MG RE SUPP
10.0000 mg | Freq: Once | RECTAL | Status: AC
  Administered 2021-05-19: 10 mg via RECTAL
  Filled 2021-05-19: qty 1

## 2021-05-19 MED ORDER — POLYETHYLENE GLYCOL 3350 17 G PO PACK
17.0000 g | PACK | Freq: Two times a day (BID) | ORAL | Status: DC
  Administered 2021-05-19: 17 g via ORAL
  Filled 2021-05-19 (×2): qty 1

## 2021-05-19 NOTE — Progress Notes (Signed)
PROGRESS NOTE     Melissa West, is a 31 y.o. female, DOB - [birth year], ZOX:[id number]  Admit date - 05/14/2021   Admitting Physician Starleen Arms, MD  Outpatient Primary MD for the patient is Benita Stabile, MD  LOS - 5  Chief Complaint  Patient presents with   Headache        Brief Narrative:    Per HPI:  Melissa West  is a 31 y.o. 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 -ED patient was noted to be hypotensive, with significant leukocytosis at 47, repeat lactic acid was elevated at 3, her UA was strongly positive, Foley catheter was exchanged in ED, she was started empirically on vancomycin and Zosyn, hypotension responded to fluid boluses, as well creatinine elevated at 1.47  ED: Blood pressure (!) 81/56, pulse (!) 106, temperature 100.2 F (37.9 C), temperature source Oral, resp. rate (!) 21, height 5\' 3"  (1.6 m), weight 81.6 kg, SpO2 100 %. WBC: 47.7   BMP BUN 32 creatinine 1.48 UA: Negative nitrite, large leukocyte esterase, WBCs   Assessment & Plan:   -Assessment and Plan: Abnormal finding of diagnostic imaging--Abnormal CT AP- (present on admission) - CT abdomen and pelvis reviewed with ID physician, Radiologist recommended MRI of the abdomen to rule out underlying neoplasm -MRI abdomen requested  Bacteremia- (present on admission) - Blood cultures growing E. coli and Enterobacter from 05-14-21 -Repeated blood cultures 05/17/2021 >> no growth to date -Blood culture repeat >> no growth to date -Urine culture >> Morganella,  Proteus----??? urine organisms contamination the patient with chronic indwelling Foley -Treated with Rocephin, transition to cefepime on 05/17/2021 -Discussion with ID physician Dr. Algis Liming recommends switching to [facility] on 05/19/2021   Sepsis secondary to UTI (HCC) - - Blood cultures growing E. coli and Enterobacter from 05-14-21 -Repeated blood cultures 05/17/2021 >> no growth to date -Blood culture repeat >> no growth to date -Urine culture >> Morganella, Proteus----??? urine organisms contamination the patient with chronic indwelling Foley -Treated with Rocephin, transition to cefepime on 05/17/2021 -Discussion with ID physician Dr. Algis Liming recommends switching to [facility] on 05/19/2021 -Sepsis pathophysiology resolved   AKI (acute kidney injury) (HCC)- (present on admission) - Resolved - Likely due to sepsis, UTI, urinary retention -Foley catheter on initial presentation thought to be clogged, was replaced in ED on 05/15/21 -Status post IV fluid resuscitation -Creatinine -normalized  Chest pain - Brief episode-resolved -EKG without acute findings -Remains chest pain-free  Hypotension- (present on admission) -Blood pressure remained soft -Off pressors-Levophed -Hypotensive likely related to autonomic system due to advanced MS Was exacerbated by sepsis -Continue midodrine   Pressure injury of skin- (present on admission) - Per nurses evaluation, continue nursing care, wound care -Continue nursing care, including rotating every 2 hours  Acute urinary retention- (present on admission) - Due to MS, neurogenic bladder -Chronic indwelling urine catheter, was changed in ER on  05/15/2021 -Clear urine with good output noted   Leukocytosis- (present on admission) - Due to sepsis, -Resolved  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 Sater/neurology in [place] -Stable    Disposition/Need for in-Hospital Stay- patient unable to be discharged at this time due to -- -BP remains soft in the setting of resolving sepsis,  -Possible discharge home on 05/20/2021 if cultures remain negative  Status is: Inpatient  Disposition: The patient is from: Home              Anticipated d/c is to: Home              Anticipated d/c date is: 1 day              Patient currently is not medically stable to d/c. Barriers: Not Clinically Stable-   Code Status :  -  Code Status: Full Code   Family Communication:    NA (patient is alert, awake and coherent)   Consults  :  ID  DVT Prophylaxis  :   - SCDs  enoxaparin (LOVENOX) injection 40 mg Start: 05/15/21 1000    Lab Results  Component Value Date   PLT 172 05/18/2021    Inpatient Medications  Scheduled Meds:  cefadroxil  1,000 mg Oral BID   Chlorhexidine Gluconate Cloth  6 each Topical Daily   enoxaparin (LOVENOX) injection  40 mg Subcutaneous Q24H   lactobacillus  1 g Oral TID WC   midodrine  10 mg Oral TID WC   pantoprazole  40 mg Oral Daily   polyethylene glycol  17 g Oral BID   Continuous Infusions:  sodium chloride Stopped (05/16/21 0802)   PRN Meds:.acetaminophen **OR** acetaminophen, alum & mag hydroxide-simeth, nitroGLYCERIN, polyvinyl alcohol, traMADol   Anti-infectives (From admission, onward)    Start     Dose/Rate Route Frequency Ordered Stop   05/19/21 1530  cefadroxil (DURICEF) capsule 1,000 mg        1,000 mg Oral 2 times daily 05/19/21 1430     05/17/21 1545  ceFEPIme (MAXIPIME) 2 g in sodium chloride 0.9 % 100 mL IVPB  Status:  Discontinued        2 g 200 mL/hr over 30 Minutes Intravenous Every 8 hours 05/17/21 1448 05/19/21 1430   05/15/21 1800  cefTRIAXone (ROCEPHIN) 2 g in sodium chloride 0.9 % 100 mL IVPB  Status:  Discontinued        2 g 200 mL/hr  over 30 Minutes Intravenous Every 24 hours 05/15/21 1224 05/17/21 1435   05/15/21 1000  ceFEPIme (MAXIPIME) 2 g in sodium chloride 0.9 % 100 mL IVPB  Status:  Discontinued        2 g 200 mL/hr over 30 Minutes Intravenous Every 8 hours 05/15/21 0902 05/15/21 1224   05/14/21 1800  ceFEPIme (MAXIPIME) 2 g in sodium chloride 0.9 % 100 mL IVPB  Status:  Discontinued        2 g 200 mL/hr over 30 Minutes Intravenous Every 12 hours 05/14/21 1652 05/15/21 0902   05/14/21 1415  cefTRIAXone (ROCEPHIN) 2 g in sodium chloride 0.9 % 100 mL IVPB  Status:  Discontinued        2 g 200 mL/hr over 30 Minutes Intravenous Every 24 hours 05/14/21 1407 05/14/21 1652         Subjective: Melissa West today has no fevers, no emesis,  No chest pain,   Oral intake is fair   Objective: Vitals:   05/19/21 1113 05/19/21 1400 05/19/21 1500 05/19/21 1700  BP:  124/71 132/71 111/67  Pulse: 66     Resp:  (!) 22 18 17   Temp: 98.8 F (37.1 C)     TempSrc: Oral     SpO2: 100%     Weight:      Height:        Intake/Output Summary (Last 24 hours) at 05/19/2021 1747 Last data filed at 05/19/2021 1700 Gross per 24 hour  Intake 741.78 ml  Output 2600 ml  Net -1858.22 ml   Filed Weights   05/14/21 1030 05/15/21 0500 05/17/21 0400  Weight: 81.6 kg 65.7 kg 69.6 kg     Physical Exam  Gen:- Awake Alert, in no apparent distress  HEENT:- [place].AT, No sclera icterus, lazy left eye Neck-Supple Neck,No JVD,.  Lungs-  CTAB , fair symmetrical air movement CV- S1, S2 normal, regular  Abd-  +ve B.Sounds, Abd Soft, No tenderness,    Extremity/Skin:- No  edema, pedal pulses present  Psych-affect is appropriate, oriented x3 Neuro-chronic neuromuscular deficits especially of the lower extremities in the setting of multiple sclerosis  GU-chronic indwelling Foley  Data Reviewed: I have personally reviewed following labs and imaging studies  CBC: Recent Labs  Lab 05/13/21 1306 05/14/21 1141 05/15/21 0415  05/16/21 0420 05/17/21 0626 05/18/21 0513  WBC 6.9 47.7* 25.1* 8.7 4.5 6.1  NEUTROABS 4.9  --   --  7.4 2.8 3.8  HGB 13.2 13.3 10.1* 10.7* 8.9* 9.4*  HCT 40.2 39.2 30.8* 34.1* 27.7* 29.9*  MCV 87 89.1 89.8 90.9 87.1 87.7  PLT 260 231 163 145* 144* 172   Basic Metabolic Panel: Recent Labs  Lab 05/14/21 1141 05/15/21 0415 05/16/21 0420 05/17/21 0626 05/18/21 0513  NA 134* 137 136 136 137  K 4.4 3.4* 3.5 3.1* 3.2*  CL 104 107 106 103 105  CO2 21* 22 23 27 26   GLUCOSE 123* 106* 81 91 87  BUN 32* 18 10 8 8   CREATININE 1.48* 0.74 0.72 0.60 0.63  CALCIUM 9.2 8.2* 8.3* 7.7* 7.8*  MG  --   --   --   --  1.5*   GFR: Estimated Creatinine Clearance: 94.5 mL/min (by C-G formula based on SCr of 0.63 mg/dL). Liver Function Tests: Recent Labs  Lab 05/13/21 1306 05/14/21 1141 05/16/21 0420 05/17/21 0626 05/18/21 0513  AST 13 18 35 140* 76*  ALT 8 11 25  125* 110*  ALKPHOS 67 59 60 114 140*  BILITOT <0.2 0.7 0.3 0.2* 0.2*  PROT 7.8 8.2* 6.3* 5.7* 6.1*  ALBUMIN 4.2 3.7 2.6* 2.2* 2.4*   No results for input(s): LIPASE, AMYLASE in the last 168 hours. No results for input(s): AMMONIA in the last 168 hours. Coagulation Profile: Recent Labs  Lab 05/14/21 1423 05/15/21 0415  INR 1.3* 1.5*   Cardiac Enzymes: No results for input(s): CKTOTAL, CKMB, CKMBINDEX, TROPONINI in the last 168 hours. BNP (last 3 results) No results for input(s): PROBNP in the last 8760 hours. HbA1C: No results for input(s): HGBA1C in the last 72 hours. CBG: No results for input(s): GLUCAP in the last 168 hours. Lipid Profile: No results for input(s): CHOL, HDL, LDLCALC, TRIG, CHOLHDL, LDLDIRECT in the last 72 hours. Thyroid Function Tests: No results for input(s): TSH, T4TOTAL, FREET4, T3FREE, THYROIDAB in the last 72 hours. Anemia Panel: No results for input(s): VITAMINB12, FOLATE, FERRITIN, TIBC, IRON, RETICCTPCT in the last 72 hours. Urine analysis:    Component Value Date/Time   COLORURINE  YELLOW 05/14/2021 1430   APPEARANCEUR HAZY (A) 05/14/2021 1430  LABSPEC 1.010 05/14/2021 1430   PHURINE 8.5 (H) 05/14/2021 1430   GLUCOSEU NEGATIVE 05/14/2021 1430   HGBUR TRACE (A) 05/14/2021 1430   BILIRUBINUR NEGATIVE 05/14/2021 1430   KETONESUR NEGATIVE 05/14/2021 1430   PROTEINUR TRACE (A) 05/14/2021 1430   UROBILINOGEN 0.2 06/13/2009 1128   NITRITE NEGATIVE 05/14/2021 1430   LEUKOCYTESUR LARGE (A) 05/14/2021 1430   Sepsis Labs: (procalcitonin:4,lacticidven:4)  ) Recent Results (from the past 240 hour(s))  Blood Culture (routine x 2)     Status: Abnormal   Collection Time: 05/14/21  2:09 PM   Specimen: BLOOD LEFT HAND  Result Value Ref Range Status   Specimen Description   Final    BLOOD LEFT HAND Performed at [facility], [address], [place], [place] [address]    Special Requests   Final    BOTTLES DRAWN AEROBIC AND ANAEROBIC Blood Culture results may not be optimal due to an inadequate volume of blood received in culture bottles Performed at [facility], [address], [place], [place] [address]    Culture  Setup Time   Final    GRAM NEGATIVE RODS IN BOTH AEROBIC AND ANAEROBIC BOTTLES Gram Stain Report Called to,Read Back By and Verified With: ALLEN,[id number]  BY MATTHEWS, B 2.3.23 CRITICAL RESULT CALLED TO, READ BACK BY AND VERIFIED WITH: S HURTH PHARMD  05/15/21 EB Performed at [facility], [address], [place], [place] [address]    Culture ESCHERICHIA COLI (A)  Final   Report Status 05/17/2021 FINAL  Final   Organism ID, Bacteria ESCHERICHIA COLI  Final      Susceptibility   Escherichia coli - MIC*    AMPICILLIN 16 INTERMEDIATE Intermediate     CEFAZOLIN <=4 SENSITIVE Sensitive     CEFEPIME <=0.12 SENSITIVE Sensitive     CEFTAZIDIME <=1 SENSITIVE Sensitive     CEFTRIAXONE <=0.25 SENSITIVE Sensitive     CIPROFLOXACIN >=4 RESISTANT Resistant     GENTAMICIN <=1 SENSITIVE Sensitive     IMIPENEM <=0.25 SENSITIVE Sensitive      TRIMETH/SULFA <=20 SENSITIVE Sensitive     AMPICILLIN/SULBACTAM 8 SENSITIVE Sensitive     PIP/TAZO <=4 SENSITIVE Sensitive     * ESCHERICHIA COLI  Blood Culture ID Panel (Reflexed)     Status: Abnormal   Collection Time: 05/14/21  2:09 PM  Result Value Ref Range Status   Enterococcus faecalis NOT DETECTED NOT DETECTED Final   Enterococcus Faecium NOT DETECTED NOT DETECTED Final   Listeria monocytogenes NOT DETECTED NOT DETECTED Final   Staphylococcus species NOT DETECTED NOT DETECTED Final   Staphylococcus aureus (BCID) NOT DETECTED NOT DETECTED Final   Staphylococcus epidermidis NOT DETECTED NOT DETECTED Final   Staphylococcus lugdunensis NOT DETECTED NOT DETECTED Final   Streptococcus species NOT DETECTED NOT DETECTED Final   Streptococcus agalactiae NOT DETECTED NOT DETECTED Final   Streptococcus pneumoniae NOT DETECTED NOT DETECTED Final   Streptococcus pyogenes NOT DETECTED NOT DETECTED Final   A.calcoaceticus-baumannii NOT DETECTED NOT DETECTED Final   Bacteroides fragilis NOT DETECTED NOT DETECTED Final   Enterobacterales DETECTED (A) NOT DETECTED Final    Comment: Enterobacterales represent a large order of gram negative bacteria, not a single organism. CRITICAL RESULT CALLED TO, READ BACK BY AND VERIFIED WITH: S HURTH PHARMD  05/15/21 EB    Enterobacter cloacae complex NOT DETECTED NOT DETECTED Final   Escherichia coli DETECTED (A) NOT DETECTED Final    Comment: CRITICAL RESULT CALLED TO, READ BACK BY AND VERIFIED WITH: S HURTH PHARMD  05/15/21 EB  Klebsiella aerogenes NOT DETECTED NOT DETECTED Final   Klebsiella oxytoca NOT DETECTED NOT DETECTED Final   Klebsiella pneumoniae NOT DETECTED NOT DETECTED Final   Proteus species NOT DETECTED NOT DETECTED Final   Salmonella species NOT DETECTED NOT DETECTED Final   Serratia marcescens NOT DETECTED NOT DETECTED Final   Haemophilus influenzae NOT DETECTED NOT DETECTED Final   Neisseria meningitidis NOT DETECTED NOT  DETECTED Final   Pseudomonas aeruginosa NOT DETECTED NOT DETECTED Final   Stenotrophomonas maltophilia NOT DETECTED NOT DETECTED Final   Candida albicans NOT DETECTED NOT DETECTED Final   Candida auris NOT DETECTED NOT DETECTED Final   Candida glabrata NOT DETECTED NOT DETECTED Final   Candida krusei NOT DETECTED NOT DETECTED Final   Candida parapsilosis NOT DETECTED NOT DETECTED Final   Candida tropicalis NOT DETECTED NOT DETECTED Final   Cryptococcus neoformans/gattii NOT DETECTED NOT DETECTED Final   CTX-M ESBL NOT DETECTED NOT DETECTED Final   Carbapenem resistance IMP NOT DETECTED NOT DETECTED Final   Carbapenem resistance KPC NOT DETECTED NOT DETECTED Final   Carbapenem resistance NDM NOT DETECTED NOT DETECTED Final   Carbapenem resist OXA 48 LIKE NOT DETECTED NOT DETECTED Final   Carbapenem resistance VIM NOT DETECTED NOT DETECTED Final    Comment: Performed at [facility], [address] [place], [place] [address]  Blood Culture (routine x 2)     Status: Abnormal   Collection Time: 05/14/21  2:23 PM   Specimen: BLOOD RIGHT HAND  Result Value Ref Range Status   Specimen Description   Final    BLOOD RIGHT HAND Performed at [facility], [address] [place], [place] [address]    Special Requests   Final    BOTTLES DRAWN AEROBIC AND ANAEROBIC Blood Culture results may not be optimal due to an inadequate volume of blood received in culture bottles Performed at [facility], [address] [place], [place] [address]    Culture  Setup Time   Final    GRAM NEGATIVE RODS AEROBIC BOTTLE ONLY CRITICAL VALUE NOTED.  VALUE IS CONSISTENT WITH PREVIOUSLY REPORTED AND CALLED VALUE. Performed at [facility], [address], [place], [place] [address]    Culture (A)  Final    ESCHERICHIA COLI SUSCEPTIBILITIES PERFORMED ON PREVIOUS CULTURE WITHIN THE LAST 5 DAYS. Performed at [facility], [address], [place], [place] [address]    Report Status 05/17/2021 FINAL   Final  Resp Panel by RT-PCR (Flu A&B, Covid) Nasopharyngeal Swab     Status: None   Collection Time: 05/14/21  2:30 PM   Specimen: Nasopharyngeal Swab; Nasopharyngeal(NP) swabs in vial transport medium  Result Value Ref Range Status   SARS Coronavirus 2 by RT PCR NEGATIVE NEGATIVE Final    Comment: (NOTE) SARS-CoV-2 target nucleic acids are NOT DETECTED.  The SARS-CoV-2 RNA is generally detectable in upper respiratory specimens during the acute phase of infection. The lowest concentration of SARS-CoV-2 viral copies this assay can detect is 138 copies/mL. A negative result does not preclude SARS-Cov-2 infection and should not be used as the sole basis for treatment or other patient management decisions. A negative result may occur with  improper specimen collection/handling, submission of specimen other than nasopharyngeal swab, presence of viral mutation(s) within the areas targeted by this assay, and inadequate number of viral copies(<138 copies/mL). A negative result must be combined with clinical observations, patient history, and epidemiological information. The expected result is Negative.  Fact Sheet for Patients:  BloggerCourse.com  Fact Sheet for Healthcare Providers:  SeriousBroker.it  This test is no t yet approved or cleared by the [place] FDA and  has been authorized for detection and/or diagnosis of SARS-CoV-2 by FDA under an Emergency Use Authorization (EUA). This EUA will remain  in effect (meaning this test can be used) for the duration of the COVID-19 declaration under Section 564(b)(1) of the Act, 21 U.S.C.section 360bbb-3(b)(1), unless the authorization is terminated  or revoked sooner.       Influenza A by PCR NEGATIVE NEGATIVE Final   Influenza B by PCR NEGATIVE NEGATIVE Final    Comment: (NOTE) The Xpert Xpress SARS-CoV-2/FLU/RSV plus assay is intended as an aid in the diagnosis of influenza from  Nasopharyngeal swab specimens and should not be used as a sole basis for treatment. Nasal washings and aspirates are unacceptable for Xpert Xpress SARS-CoV-2/FLU/RSV testing.  Fact Sheet for Patients: BloggerCourse.com  Fact Sheet for Healthcare Providers: SeriousBroker.it  This test is not yet approved or cleared by the [place] FDA and has been authorized for detection and/or diagnosis of SARS-CoV-2 by FDA under an Emergency Use Authorization (EUA). This EUA will remain in effect (meaning this test can be used) for the duration of the COVID-19 declaration under Section 564(b)(1) of the Act, 21 U.S.C. section 360bbb-3(b)(1), unless the authorization is terminated or revoked.  Performed at [facility], [address], [place], [place] [address]   Urine Culture     Status: Abnormal   Collection Time: 05/14/21  2:34 PM   Specimen: In/Out Cath Urine  Result Value Ref Range Status   Specimen Description   Final    IN/OUT CATH URINE Performed at [facility], [address], [place], [place] [address]    Special Requests   Final    NONE Performed at [facility], [address] [place], [place] [address]    Culture (A)  Final    >=100,000 COLONIES/mL MORGANELLA MORGANII 30,000 COLONIES/mL PROTEUS PENNERI    Report Status 05/17/2021 FINAL  Final   Organism ID, Bacteria MORGANELLA MORGANII (A)  Final   Organism ID, Bacteria PROTEUS PENNERI (A)  Final      Susceptibility   Morganella morganii - MIC*    AMPICILLIN >=32 RESISTANT Resistant     CEFAZOLIN >=64 RESISTANT Resistant     CIPROFLOXACIN <=0.25 SENSITIVE Sensitive     GENTAMICIN <=1 SENSITIVE Sensitive     IMIPENEM 2 SENSITIVE Sensitive     NITROFURANTOIN 128 RESISTANT Resistant     TRIMETH/SULFA <=20 SENSITIVE Sensitive     AMPICILLIN/SULBACTAM 16 INTERMEDIATE Intermediate     PIP/TAZO <=4 SENSITIVE Sensitive     * >=100,000 COLONIES/mL MORGANELLA MORGANII    Proteus penneri - MIC*    AMPICILLIN >=32 RESISTANT Resistant     CEFAZOLIN >=64 RESISTANT Resistant     CEFEPIME <=0.12 SENSITIVE Sensitive     CEFTRIAXONE <=0.25 SENSITIVE Sensitive     CIPROFLOXACIN <=0.25 SENSITIVE Sensitive     GENTAMICIN <=1 SENSITIVE Sensitive     IMIPENEM 2 SENSITIVE Sensitive     NITROFURANTOIN RESISTANT Resistant     TRIMETH/SULFA <=20 SENSITIVE Sensitive     AMPICILLIN/SULBACTAM 8 SENSITIVE Sensitive     PIP/TAZO <=4 SENSITIVE Sensitive     * 30,000 COLONIES/mL PROTEUS PENNERI  MRSA Next Gen by PCR, Nasal     Status: None   Collection Time: 05/14/21  6:00 PM   Specimen: Nasal Mucosa; Nasal Swab  Result Value Ref Range Status   MRSA by PCR Next  Gen NOT DETECTED NOT DETECTED Final    Comment: (NOTE) The GeneXpert MRSA Assay (FDA approved for NASAL specimens only), is one component of a comprehensive MRSA colonization surveillance program. It is not intended to diagnose MRSA infection nor to guide or monitor treatment for MRSA infections. Test performance is not FDA approved in patients less than 30 years old. Performed at [facility], [address] [place], [place] [address]   Culture, blood (routine x 2)     Status: None (Preliminary result)   Collection Time: 05/17/21  8:04 AM   Specimen: BLOOD RIGHT HAND  Result Value Ref Range Status   Specimen Description   Final    BLOOD RIGHT HAND BOTTLES DRAWN AEROBIC AND ANAEROBIC   Special Requests   Final    Blood Culture results may not be optimal due to an excessive volume of blood received in culture bottles   Culture   Final    NO GROWTH 2 DAYS Performed at [facility], [address], [place], [place] [address]    Report Status PENDING  Incomplete  Culture, blood (routine x 2)     Status: None (Preliminary result)   Collection Time: 05/17/21  8:08 AM   Specimen: Left Antecubital; Blood  Result Value Ref Range Status   Specimen Description   Final    LEFT ANTECUBITAL BOTTLES DRAWN AEROBIC AND  ANAEROBIC   Special Requests   Final    Blood Culture results may not be optimal due to an excessive volume of blood received in culture bottles   Culture   Final    NO GROWTH 2 DAYS Performed at [facility], [address] [place], [place] [address]    Report Status PENDING  Incomplete      Radiology Studies: MR ABDOMEN W WO CONTRAST  Result Date: 05/19/2021 CLINICAL DATA:  Indeterminate renal and hepatic lesions on recent CT. Sepsis. EXAM: MRI ABDOMEN WITHOUT AND WITH CONTRAST TECHNIQUE: Multiplanar multisequence MR imaging of the abdomen was performed both before and after the administration of intravenous contrast. CONTRAST:  7mL GADAVIST GADOBUTROL 1 MMOL/ML IV SOLN COMPARISON:  CT on 05/18/2021 FINDINGS: Lower chest: Right lower lobe airspace disease and small bilateral pleural effusions are again seen. Hepatobiliary: Image degradation by respiratory motion artifact noted. No hepatic masses identified. No evidence of steatosis. Gallbladder is unremarkable. No evidence of biliary ductal dilatation. Pancreas:  No mass or inflammatory changes. Spleen:  Within normal limits in size and appearance. Adrenals/Urinary Tract: Left kidney is normal in appearance. A subtle ill-defined area of swelling, decreased enhancement, and restricted diffusion is seen in the anterior mid pole of the right kidney, which most likely represents pyelonephritis. No solid renal mass or renal abscess identified. No evidence of hydronephrosis. Stomach/Bowel: Unremarkable. Vascular/Lymphatic: No pathologically enlarged lymph nodes identified. No acute vascular findings. Other:  None. Musculoskeletal:  No suspicious bone lesions identified. IMPRESSION: Ill-defined area of swelling and decreased enhancement in the right kidney, most likely due to pyelonephritis. No evidence of renal abscess or hydronephrosis. Recommend continued follow-up by abdomen CT without and with contrast to confirm resolution. Previously seen liver lesion  is not visualized on this study, possibly due to its small size and respiratory motion artifact on this exam. Recommend continued attention on follow-up CT. Stable right lower lobe airspace disease and small bilateral pleural effusions. Electronically Signed   By: Danae Orleans M.D.   On: 05/19/2021 16:37   CT ABDOMEN PELVIS W CONTRAST  Result Date: 05/18/2021 CLINICAL DATA:  Sepsis. EXAM:  CT ABDOMEN AND PELVIS WITH CONTRAST TECHNIQUE: Multidetector CT imaging of the abdomen and pelvis was performed using the standard protocol following bolus administration of intravenous contrast. RADIATION DOSE REDUCTION: This exam was performed according to the departmental dose-optimization program which includes automated exposure control, adjustment of the mA and/or kV according to patient size and/or use of iterative reconstruction technique. CONTRAST:  OMNIPAQUE IOHEXOL 300 MG/ML  SOLN COMPARISON:  May 14, 2021. FINDINGS: Lower chest: Right lower lobe airspace opacity is noted consistent with pneumonia. Small bilateral pleural effusions are noted. Hepatobiliary: Cholelithiasis is noted. No biliary dilatation is noted. 11 mm enhancing focus is noted anteriorly in medial segment of left hepatic lobe. Pancreas: Unremarkable. No pancreatic ductal dilatation or surrounding inflammatory changes. Spleen: Normal in size without focal abnormality. Adrenals/Urinary Tract: Adrenal glands appear normal. No renal or ureteral calculi are noted. No hydronephrosis or renal obstruction is noted. Urinary bladder is decompressed secondary to Foley catheter. Ill-defined hypodense area is noted anteriorly in the midpole the right kidney which may represent focal pyelonephritis, but neoplasm cannot be excluded. Stomach/Bowel: The stomach appears normal. There is no evidence of bowel obstruction or inflammation. Vascular/Lymphatic: No significant vascular findings are present. No enlarged abdominal or pelvic lymph nodes. Reproductive:  Uterus and bilateral adnexa are unremarkable. Other: No abdominal wall hernia or abnormality. No abdominopelvic ascites. Musculoskeletal: No acute or significant osseous findings. IMPRESSION: Right lower lobe airspace opacity is noted consistent with pneumonia. Small bilateral pleural effusions are noted as well. Ill-defined hyperdense area is noted anteriorly in midpole of the right kidney which may represent focal pyelonephritis, but neoplasm cannot be excluded. Also noted is 11 mm enhancing abnormality in left hepatic lobe which may represent hemangioma, but neoplasm once again cannot be excluded. When the patient is clinically stable and able to follow directions and hold their breath (preferably as an outpatient) further evaluation with dedicated abdominal MRI should be considered. Cholelithiasis. Electronically Signed   By: Lupita Raider M.D.   On: 05/18/2021 15:25     Scheduled Meds:  cefadroxil  1,000 mg Oral BID   Chlorhexidine Gluconate Cloth  6 each Topical Daily   enoxaparin (LOVENOX) injection  40 mg Subcutaneous Q24H   lactobacillus  1 g Oral TID WC   midodrine  10 mg Oral TID WC   pantoprazole  40 mg Oral Daily   polyethylene glycol  17 g Oral BID   Continuous Infusions:  sodium chloride Stopped (05/16/21 0802)     LOS: 5 days    Shon Hale M.D on 05/19/2021 at 5:47 PM  Go to [email or web address] - for contact info  [facility] - Office  [phone]  If 7PM-7AM, please contact night-coverage [email or web address] Password [email or web address] 05/19/2021, 5:47 PM

## 2021-05-19 NOTE — Progress Notes (Signed)
Virtual Visit via Video Note  I connected with Melissa West on 05/19/2021  by a video enabled telemedicine application and verified that I am speaking with the correct person using two identifiers.  Location: Patient: ICU bed 12 Provider: [facility]   I discussed the limitations of evaluation and management by telemedicine and the availability of in person appointments. The patient expressed understanding and agreed to proceed.     Subjective:  She is eager to go home   Antibiotics:  Anti-infectives (From admission, onward)    Start     Dose/Rate Route Frequency Ordered Stop   05/19/21 1530  cefadroxil (DURICEF) capsule 1,000 mg        1,000 mg Oral 2 times daily 05/19/21 1430     05/17/21 1545  ceFEPIme (MAXIPIME) 2 g in sodium chloride 0.9 % 100 mL IVPB  Status:  Discontinued        2 g 200 mL/hr over 30 Minutes Intravenous Every 8 hours 05/17/21 1448 05/19/21 1430   05/15/21 1800  cefTRIAXone (ROCEPHIN) 2 g in sodium chloride 0.9 % 100 mL IVPB  Status:  Discontinued        2 g 200 mL/hr over 30 Minutes Intravenous Every 24 hours 05/15/21 1224 05/17/21 1435   05/15/21 1000  ceFEPIme (MAXIPIME) 2 g in sodium chloride 0.9 % 100 mL IVPB  Status:  Discontinued        2 g 200 mL/hr over 30 Minutes Intravenous Every 8 hours 05/15/21 0902 05/15/21 1224   05/14/21 1800  ceFEPIme (MAXIPIME) 2 g in sodium chloride 0.9 % 100 mL IVPB  Status:  Discontinued        2 g 200 mL/hr over 30 Minutes Intravenous Every 12 hours 05/14/21 1652 05/15/21 0902   05/14/21 1415  cefTRIAXone (ROCEPHIN) 2 g in sodium chloride 0.9 % 100 mL IVPB  Status:  Discontinued        2 g 200 mL/hr over 30 Minutes Intravenous Every 24 hours 05/14/21 1407 05/14/21 1652       Medications: Scheduled Meds:  cefadroxil  1,000 mg Oral BID   Chlorhexidine Gluconate Cloth  6 each Topical Daily   enoxaparin (LOVENOX) injection  40 mg Subcutaneous Q24H   lactobacillus  1 g Oral TID WC   midodrine  10 mg Oral  TID WC   pantoprazole  40 mg Oral Daily   polyethylene glycol  17 g Oral BID   Continuous Infusions:  sodium chloride Stopped (05/16/21 0802)   PRN Meds:.acetaminophen **OR** acetaminophen, alum & mag hydroxide-simeth, nitroGLYCERIN, polyvinyl alcohol, traMADol    Objective: Weight change:   Intake/Output Summary (Last 24 hours) at 05/19/2021 1438 Last data filed at 05/19/2021 1332 Gross per 24 hour  Intake 891.78 ml  Output 3600 ml  Net -2708.22 ml    Blood pressure 113/68, pulse 66, temperature 98.8 F (37.1 C), temperature source Oral, resp. rate 16, height 5\' 3"  (1.6 m), weight 69.6 kg, SpO2 100 %. Temp:  [98 F (36.7 C)-99.4 F (37.4 C)] 98.8 F (37.1 C) (02/07 1113) Pulse Rate:  [66-71] 66 (02/07 1113) Resp:  [10-17] 16 (02/07 0800) BP: (113)/(68) 113/68 (02/06 1500) SpO2:  [94 %-100 %] 100 % (02/07 1113)  Physical Exam: Physical Exam Constitutional:      General: She is not in acute distress.    Appearance: She is well-developed. She is not diaphoretic.  HENT:     Head: Normocephalic and atraumatic.     Right Ear: External ear  normal.     Left Ear: External ear normal.     Mouth/Throat:     Pharynx: No oropharyngeal exudate.  Eyes:     General: No scleral icterus.    Extraocular Movements: Extraocular movements intact.     Conjunctiva/sclera: Conjunctivae normal.     Pupils: Pupils are equal, round, and reactive to light.  Cardiovascular:     Rate and Rhythm: Normal rate and regular rhythm.  Pulmonary:     Effort: Pulmonary effort is normal. No respiratory distress.     Breath sounds: No wheezing.  Abdominal:     General: There is no distension.     Palpations: Abdomen is soft.     Tenderness: There is no rebound.  Musculoskeletal:        General: Normal range of motion.  Skin:    General: Skin is warm and dry.     Coloration: Skin is not pale.     Findings: No erythema or rash.  Neurological:     General: No focal deficit present.     Mental  Status: She is alert and oriented to person, place, and time.     Motor: No abnormal muscle tone.     Coordination: Coordination normal.  Psychiatric:        Mood and Affect: Mood normal. Mood is not anxious or depressed.        Behavior: Behavior normal. Behavior is not agitated.        Thought Content: Thought content normal.        Cognition and Memory: Cognition is not impaired. Memory is not impaired.        Judgment: Judgment normal.     CBC:    BMET Recent Labs    05/17/21 0626 05/18/21 0513  NA 136 137  K 3.1* 3.2*  CL 103 105  CO2 27 26  GLUCOSE 91 87  BUN 8 8  CREATININE 0.60 0.63  CALCIUM 7.7* 7.8*      Liver Panel  Recent Labs    05/17/21 0626 05/18/21 0513  PROT 5.7* 6.1*  ALBUMIN 2.2* 2.4*  AST 140* 76*  ALT 125* 110*  ALKPHOS 114 140*  BILITOT 0.2* 0.2*        Sedimentation Rate No results for input(s): ESRSEDRATE in the last 72 hours. C-Reactive Protein No results for input(s): CRP in the last 72 hours.  Micro Results: Recent Results (from the past 720 hour(s))  Urine Culture     Status: Abnormal   Collection Time: 05/07/21  2:29 AM   Specimen: Urine, Catheterized  Result Value Ref Range Status   Specimen Description   Final    URINE, CATHETERIZED Performed at [facility], [address], [place], [place] [address]    Special Requests   Final    NONE Performed at [facility], [address], [place], [place] [address]    Culture 50,000 COLONIES/mL ESCHERICHIA COLI (A)  Final   Report Status 05/09/2021 FINAL  Final   Organism ID, Bacteria ESCHERICHIA COLI (A)  Final      Susceptibility   Escherichia coli - MIC*    AMPICILLIN 16 INTERMEDIATE Intermediate     CEFAZOLIN <=4 SENSITIVE Sensitive     CEFEPIME <=0.12 SENSITIVE Sensitive     CEFTRIAXONE <=0.25 SENSITIVE Sensitive     CIPROFLOXACIN >=4 RESISTANT Resistant     GENTAMICIN <=1 SENSITIVE Sensitive     IMIPENEM <=0.25 SENSITIVE Sensitive     NITROFURANTOIN <=16  SENSITIVE Sensitive  TRIMETH/SULFA <=20 SENSITIVE Sensitive     AMPICILLIN/SULBACTAM 8 SENSITIVE Sensitive     PIP/TAZO <=4 SENSITIVE Sensitive     * 50,000 COLONIES/mL ESCHERICHIA COLI  Blood Culture (routine x 2)     Status: Abnormal   Collection Time: 05/14/21  2:09 PM   Specimen: BLOOD LEFT HAND  Result Value Ref Range Status   Specimen Description   Final    BLOOD LEFT HAND Performed at [facility], [address], [place], [place] [address]    Special Requests   Final    BOTTLES DRAWN AEROBIC AND ANAEROBIC Blood Culture results may not be optimal due to an inadequate volume of blood received in culture bottles Performed at [facility] [place], [place] [address]    Culture  Setup Time   Final    GRAM NEGATIVE RODS IN BOTH AEROBIC AND ANAEROBIC BOTTLES Gram Stain Report Called to,Read Back By and Verified With: ALLEN,T@0559  BY MATTHEWS, B 2.3.23 CRITICAL RESULT CALLED TO, READ BACK BY AND VERIFIED WITH: S HURTH PHARMD @1110  05/15/21 EB Performed at [facility] [address], [place], [place] [address]    Culture ESCHERICHIA COLI (A)  Final   Report Status 05/17/2021 FINAL  Final   Organism ID, Bacteria ESCHERICHIA COLI  Final      Susceptibility   Escherichia coli - MIC*    AMPICILLIN 16 INTERMEDIATE Intermediate     CEFAZOLIN <=4 SENSITIVE Sensitive     CEFEPIME <=0.12 SENSITIVE Sensitive     CEFTAZIDIME <=1 SENSITIVE Sensitive     CEFTRIAXONE <=0.25 SENSITIVE Sensitive     CIPROFLOXACIN >=4 RESISTANT Resistant     GENTAMICIN <=1 SENSITIVE Sensitive     IMIPENEM <=0.25 SENSITIVE Sensitive     TRIMETH/SULFA <=20 SENSITIVE Sensitive     AMPICILLIN/SULBACTAM 8 SENSITIVE Sensitive     PIP/TAZO <=4 SENSITIVE Sensitive     * ESCHERICHIA COLI  Blood Culture ID Panel (Reflexed)     Status: Abnormal   Collection Time: 05/14/21  2:09 PM  Result Value Ref Range Status   Enterococcus faecalis NOT DETECTED NOT DETECTED Final   Enterococcus Faecium NOT  DETECTED NOT DETECTED Final   Listeria monocytogenes NOT DETECTED NOT DETECTED Final   Staphylococcus species NOT DETECTED NOT DETECTED Final   Staphylococcus aureus (BCID) NOT DETECTED NOT DETECTED Final   Staphylococcus epidermidis NOT DETECTED NOT DETECTED Final   Staphylococcus lugdunensis NOT DETECTED NOT DETECTED Final   Streptococcus species NOT DETECTED NOT DETECTED Final   Streptococcus agalactiae NOT DETECTED NOT DETECTED Final   Streptococcus pneumoniae NOT DETECTED NOT DETECTED Final   Streptococcus pyogenes NOT DETECTED NOT DETECTED Final   A.calcoaceticus-baumannii NOT DETECTED NOT DETECTED Final   Bacteroides fragilis NOT DETECTED NOT DETECTED Final   Enterobacterales DETECTED (A) NOT DETECTED Final    Comment: Enterobacterales represent a large order of gram negative bacteria, not a single organism. CRITICAL RESULT CALLED TO, READ BACK BY AND VERIFIED WITH: S HURTH PHARMD @1110  05/15/21 EB    Enterobacter cloacae complex NOT DETECTED NOT DETECTED Final   Escherichia coli DETECTED (A) NOT DETECTED Final    Comment: CRITICAL RESULT CALLED TO, READ BACK BY AND VERIFIED WITH: S HURTH PHARMD @1110  05/15/21 EB    Klebsiella aerogenes NOT DETECTED NOT DETECTED Final   Klebsiella oxytoca NOT DETECTED NOT DETECTED Final   Klebsiella pneumoniae NOT DETECTED NOT DETECTED Final   Proteus species NOT DETECTED NOT DETECTED Final   Salmonella species NOT DETECTED NOT DETECTED Final  Serratia marcescens NOT DETECTED NOT DETECTED Final   Haemophilus influenzae NOT DETECTED NOT DETECTED Final   Neisseria meningitidis NOT DETECTED NOT DETECTED Final   Pseudomonas aeruginosa NOT DETECTED NOT DETECTED Final   Stenotrophomonas maltophilia NOT DETECTED NOT DETECTED Final   Candida albicans NOT DETECTED NOT DETECTED Final   Candida auris NOT DETECTED NOT DETECTED Final   Candida glabrata NOT DETECTED NOT DETECTED Final   Candida krusei NOT DETECTED NOT DETECTED Final   Candida parapsilosis  NOT DETECTED NOT DETECTED Final   Candida tropicalis NOT DETECTED NOT DETECTED Final   Cryptococcus neoformans/gattii NOT DETECTED NOT DETECTED Final   CTX-M ESBL NOT DETECTED NOT DETECTED Final   Carbapenem resistance IMP NOT DETECTED NOT DETECTED Final   Carbapenem resistance KPC NOT DETECTED NOT DETECTED Final   Carbapenem resistance NDM NOT DETECTED NOT DETECTED Final   Carbapenem resist OXA 48 LIKE NOT DETECTED NOT DETECTED Final   Carbapenem resistance VIM NOT DETECTED NOT DETECTED Final    Comment: Performed at [facility], [address] [place], [place] [address]  Blood Culture (routine x 2)     Status: Abnormal   Collection Time: 05/14/21  2:23 PM   Specimen: BLOOD RIGHT HAND  Result Value Ref Range Status   Specimen Description   Final    BLOOD RIGHT HAND Performed at [facility], [address] [place], [place] [address]    Special Requests   Final    BOTTLES DRAWN AEROBIC AND ANAEROBIC Blood Culture results may not be optimal due to an inadequate volume of blood received in culture bottles Performed at [facility], [address] [place], [place] [address]    Culture  Setup Time   Final    GRAM NEGATIVE RODS AEROBIC BOTTLE ONLY CRITICAL VALUE NOTED.  VALUE IS CONSISTENT WITH PREVIOUSLY REPORTED AND CALLED VALUE. Performed at [facility], [address], [place], [place] [address]    Culture (A)  Final    ESCHERICHIA COLI SUSCEPTIBILITIES PERFORMED ON PREVIOUS CULTURE WITHIN THE LAST 5 DAYS. Performed at [facility], [address], [place], [place] [address]    Report Status 05/17/2021 FINAL  Final  Resp Panel by RT-PCR (Flu A&B, Covid) Nasopharyngeal Swab     Status: None   Collection Time: 05/14/21  2:30 PM   Specimen: Nasopharyngeal Swab; Nasopharyngeal(NP) swabs in vial transport medium  Result Value Ref Range Status   SARS Coronavirus 2 by RT PCR NEGATIVE NEGATIVE Final    Comment: (NOTE) SARS-CoV-2 target nucleic acids are NOT  DETECTED.  The SARS-CoV-2 RNA is generally detectable in upper respiratory specimens during the acute phase of infection. The lowest concentration of SARS-CoV-2 viral copies this assay can detect is 138 copies/mL. A negative result does not preclude SARS-Cov-2 infection and should not be used as the sole basis for treatment or other patient management decisions. A negative result may occur with  improper specimen collection/handling, submission of specimen other than nasopharyngeal swab, presence of viral mutation(s) within the areas targeted by this assay, and inadequate number of viral copies(<138 copies/mL). A negative result must be combined with clinical observations, patient history, and epidemiological information. The expected result is Negative.  Fact Sheet for Patients:  BloggerCourse.com  Fact Sheet for Healthcare Providers:  SeriousBroker.it  This test is no t yet approved or cleared by the [place] FDA and  has been authorized for detection and/or diagnosis of SARS-CoV-2 by FDA under an Emergency Use Authorization (EUA). This EUA will remain  in effect (meaning this test can be used) for the duration of the COVID-19 declaration under Section 564(b)(1) of the Act, 21 U.S.C.section 360bbb-3(b)(1), unless the authorization is terminated  or revoked sooner.       Influenza A by PCR NEGATIVE NEGATIVE Final   Influenza B by PCR NEGATIVE NEGATIVE Final    Comment: (NOTE) The Xpert Xpress SARS-CoV-2/FLU/RSV plus assay is intended as an aid in the diagnosis of influenza from Nasopharyngeal swab specimens and should not be used as a sole basis for treatment. Nasal washings and aspirates are unacceptable for Xpert Xpress SARS-CoV-2/FLU/RSV testing.  Fact Sheet for Patients: EntrepreneurPulse.com.au  Fact Sheet for Healthcare Providers: IncredibleEmployment.be  This test is not yet  approved or cleared by the [place] FDA and has been authorized for detection and/or diagnosis of SARS-CoV-2 by FDA under an Emergency Use Authorization (EUA). This EUA will remain in effect (meaning this test can be used) for the duration of the COVID-19 declaration under Section 564(b)(1) of the Act, 21 U.S.C. section 360bbb-3(b)(1), unless the authorization is terminated or revoked.  Performed at [facility], [address], [place], [place] [address]   Urine Culture     Status: Abnormal   Collection Time: 05/14/21  2:34 PM   Specimen: In/Out Cath Urine  Result Value Ref Range Status   Specimen Description   Final    IN/OUT CATH URINE Performed at [facility], [address], [place], [place] [address]    Special Requests   Final    NONE Performed at [facility], [address] [place],  [address]    Culture (A)  Final    >=100,000 COLONIES/mL MORGANELLA MORGANII 30,000 COLONIES/mL PROTEUS PENNERI    Report Status 05/17/2021 FINAL  Final   Organism ID, Bacteria MORGANELLA MORGANII (A)  Final   Organism ID, Bacteria PROTEUS PENNERI (A)  Final      Susceptibility   Morganella morganii - MIC*    AMPICILLIN >=32 RESISTANT Resistant     CEFAZOLIN >=64 RESISTANT Resistant     CIPROFLOXACIN <=0.25 SENSITIVE Sensitive     GENTAMICIN <=1 SENSITIVE Sensitive     IMIPENEM 2 SENSITIVE Sensitive     NITROFURANTOIN 128 RESISTANT Resistant     TRIMETH/SULFA <=20 SENSITIVE Sensitive     AMPICILLIN/SULBACTAM 16 INTERMEDIATE Intermediate     PIP/TAZO <=4 SENSITIVE Sensitive     * >=100,000 COLONIES/mL MORGANELLA MORGANII   Proteus penneri - MIC*    AMPICILLIN >=32 RESISTANT Resistant     CEFAZOLIN >=64 RESISTANT Resistant     CEFEPIME <=0.12 SENSITIVE Sensitive     CEFTRIAXONE <=0.25 SENSITIVE Sensitive     CIPROFLOXACIN <=0.25 SENSITIVE Sensitive     GENTAMICIN <=1 SENSITIVE Sensitive     IMIPENEM 2 SENSITIVE Sensitive     NITROFURANTOIN RESISTANT Resistant      TRIMETH/SULFA <=20 SENSITIVE Sensitive     AMPICILLIN/SULBACTAM 8 SENSITIVE Sensitive     PIP/TAZO <=4 SENSITIVE Sensitive     * 30,000 COLONIES/mL PROTEUS PENNERI  MRSA Next Gen by PCR, Nasal     Status: None   Collection Time: 05/14/21  6:00 PM   Specimen: Nasal Mucosa; Nasal Swab  Result Value Ref Range Status   MRSA by PCR Next Gen NOT DETECTED NOT DETECTED Final    Comment: (NOTE) The GeneXpert MRSA Assay (FDA approved for NASAL specimens only), is one component of a comprehensive MRSA colonization surveillance program. It is not intended to diagnose MRSA infection nor to guide or monitor treatment for  MRSA infections. Test performance is not FDA approved in patients less than 98 years old. Performed at [facility], [address], [place], [place] [address]   Culture, blood (routine x 2)     Status: None (Preliminary result)   Collection Time: 05/17/21  8:04 AM   Specimen: BLOOD RIGHT HAND  Result Value Ref Range Status   Specimen Description   Final    BLOOD RIGHT HAND BOTTLES DRAWN AEROBIC AND ANAEROBIC   Special Requests   Final    Blood Culture results may not be optimal due to an excessive volume of blood received in culture bottles   Culture   Final    NO GROWTH 2 DAYS Performed at [facility], [address] [place], [place] [address]    Report Status PENDING  Incomplete  Culture, blood (routine x 2)     Status: None (Preliminary result)   Collection Time: 05/17/21  8:08 AM   Specimen: Left Antecubital; Blood  Result Value Ref Range Status   Specimen Description   Final    LEFT ANTECUBITAL BOTTLES DRAWN AEROBIC AND ANAEROBIC   Special Requests   Final    Blood Culture results may not be optimal due to an excessive volume of blood received in culture bottles   Culture   Final    NO GROWTH 2 DAYS Performed at [facility], [address], [place], [place] [address]    Report Status PENDING  Incomplete    Studies/Results: CT ABDOMEN PELVIS W CONTRAST  Result  Date: 05/18/2021 CLINICAL DATA:  Sepsis. EXAM: CT ABDOMEN AND PELVIS WITH CONTRAST TECHNIQUE: Multidetector CT imaging of the abdomen and pelvis was performed using the standard protocol following bolus administration of intravenous contrast. RADIATION DOSE REDUCTION: This exam was performed according to the departmental dose-optimization program which includes automated exposure control, adjustment of the mA and/or kV according to patient size and/or use of iterative reconstruction technique. CONTRAST:  178mL OMNIPAQUE IOHEXOL 300 MG/ML  SOLN COMPARISON:  May 14, 2021. FINDINGS: Lower chest: Right lower lobe airspace opacity is noted consistent with pneumonia. Small bilateral pleural effusions are noted. Hepatobiliary: Cholelithiasis is noted. No biliary dilatation is noted. 11 mm enhancing focus is noted anteriorly in medial segment of left hepatic lobe. Pancreas: Unremarkable. No pancreatic ductal dilatation or surrounding inflammatory changes. Spleen: Normal in size without focal abnormality. Adrenals/Urinary Tract: Adrenal glands appear normal. No renal or ureteral calculi are noted. No hydronephrosis or renal obstruction is noted. Urinary bladder is decompressed secondary to Foley catheter. Ill-defined hypodense area is noted anteriorly in the midpole the right kidney which may represent focal pyelonephritis, but neoplasm cannot be excluded. Stomach/Bowel: The stomach appears normal. There is no evidence of bowel obstruction or inflammation. Vascular/Lymphatic: No significant vascular findings are present. No enlarged abdominal or pelvic lymph nodes. Reproductive: Uterus and bilateral adnexa are unremarkable. Other: No abdominal wall hernia or abnormality. No abdominopelvic ascites. Musculoskeletal: No acute or significant osseous findings. IMPRESSION: Right lower lobe airspace opacity is noted consistent with pneumonia. Small bilateral pleural effusions are noted as well. Ill-defined hyperdense area is  noted anteriorly in midpole of the right kidney which may represent focal pyelonephritis, but neoplasm cannot be excluded. Also noted is 11 mm enhancing abnormality in left hepatic lobe which may represent hemangioma, but neoplasm once again cannot be excluded. When the patient is clinically stable and able to follow directions and hold their breath (preferably as an outpatient) further evaluation with dedicated abdominal MRI should be considered. Cholelithiasis. Electronically Signed  By: Marijo Conception M.D.   On: 05/18/2021 15:25      Assessment/Plan:  INTERVAL HISTORY:   CT abdomen and pelvis complete   Active Problems:   MS (multiple sclerosis) (HCC)   Wheelchair dependence   AKI (acute kidney injury) ([place])   Leukocytosis   Acute urinary retention   Pressure injury of skin   Sepsis secondary to UTI ([place])   Hypotension   Chest pain   Bacteremia    Melissa West is a 31 y.o. female with relapsing remitting MS who is wheelchair-bound with neurogenic bladder chronic Foley catheter admitted for what was thought to be urosepsis is found to have E. coli bacteremia.  She has grown discordant organisms from urine and blood and was changed back to cefepime from ceftriaxone yesterday on Sunday though I do not think theorganisms in the urine are of signficance nor need targetting  Repeat blood cultures have been taken.  #1 E coli bacteremia  CT abdomen does not show any novel source. ? RLL infiltrate only new idea. The kidneys have area of possible radiographic pyelo vs (seems not likely) neoplasm  Would presume source is urine, regardless there is no deep undrained infection.  I will change her to cefadroxil and she can complete 7 days of total therapy between IV and oral (only need abx today and tomorrow)  #2 ? Hemangioma in liver vs neoplasm and other area in kidney  Pt is cabable of cooperating w MRI and would go ahead and get MRI to make sure exlude actual  neoplasm  . I spent  36 minutes with the patient including than 50% of the time in face to face counseling of the patient re her E coli bacteremia, findings on CT, reasons for MRI personally reviewing CT abdomen and pelvis from yesterday along with review of updated cultures, cbc bmp along with review of medical records in preparation for the visit and during the visit and in coordination of her care with Dr. Denton Brick.  I will sign off for now.  Please call with further questions.     LOS: 5 days   Alcide Evener 05/19/2021, 2:38 PM

## 2021-05-19 NOTE — Assessment & Plan Note (Addendum)
-   CT abdomen and pelvis reviewed with ID physician, Radiologist recommended MRI of the abdomen to rule out underlying neoplasm -MRI abdomen without additional new concerning findings specifically no evidence of malignancy abscesses or hydronephrosis

## 2021-05-19 NOTE — Progress Notes (Signed)
Patient lost IV access. One is occluded and other was removed by patient. At this time patient is angry and refusing to allow me to start another IV. She stated that we have stuck her too many times. Patient has scheduled labs that she has stuck for. The two Ivs removed were placed upon admission. IV antibiotics are due at this time but are unable to be given. MD is aware. Patient educated that it is her right to refuse and educated on why IV access is needed. Patient stated understanding. Patient is requesting to speak with MD at this time. MD paged.

## 2021-05-20 DIAGNOSIS — I959 Hypotension, unspecified: Secondary | ICD-10-CM

## 2021-05-20 DIAGNOSIS — R9389 Abnormal findings on diagnostic imaging of other specified body structures: Secondary | ICD-10-CM

## 2021-05-20 DIAGNOSIS — D649 Anemia, unspecified: Secondary | ICD-10-CM

## 2021-05-20 DIAGNOSIS — N179 Acute kidney failure, unspecified: Secondary | ICD-10-CM

## 2021-05-20 LAB — CBC
HCT: 32.4 % — ABNORMAL LOW (ref 36.0–46.0)
Hemoglobin: 10.3 g/dL — ABNORMAL LOW (ref 12.0–15.0)
MCH: 27.7 pg (ref 26.0–34.0)
MCHC: 31.8 g/dL (ref 30.0–36.0)
MCV: 87.1 fL (ref 80.0–100.0)
Platelets: 265 10*3/uL (ref 150–400)
RBC: 3.72 MIL/uL — ABNORMAL LOW (ref 3.87–5.11)
RDW: 14.8 % (ref 11.5–15.5)
WBC: 6.1 10*3/uL (ref 4.0–10.5)
nRBC: 0 % (ref 0.0–0.2)

## 2021-05-20 LAB — BASIC METABOLIC PANEL
Anion gap: 7 (ref 5–15)
BUN: 5 mg/dL — ABNORMAL LOW (ref 6–20)
CO2: 26 mmol/L (ref 22–32)
Calcium: 8.8 mg/dL — ABNORMAL LOW (ref 8.9–10.3)
Chloride: 105 mmol/L (ref 98–111)
Creatinine, Ser: 0.66 mg/dL (ref 0.44–1.00)
GFR, Estimated: >60 mL/min (ref >60)
Glucose, Bld: 99 mg/dL (ref 70–99)
Potassium: 3.9 mmol/L (ref 3.5–5.1)
Sodium: 138 mmol/L (ref 135–145)

## 2021-05-20 MED ORDER — CEFADROXIL 500 MG PO CAPS: 1000.0000 mg | ORAL_CAPSULE | Freq: Two times a day (BID) | ORAL | 0 refills | Status: AC

## 2021-05-20 MED ORDER — FLORANEX PO PACK: 1.0000 g | PACK | Freq: Three times a day (TID) | ORAL | 0 refills | Status: DC

## 2021-05-20 MED ORDER — PANTOPRAZOLE SODIUM 40 MG PO TBEC: 40.0000 mg | DELAYED_RELEASE_TABLET | Freq: Every day | ORAL | 3 refills | Status: DC

## 2021-05-20 MED ORDER — BACLOFEN 10 MG PO TABS: ORAL_TABLET | ORAL | 11 refills | Status: DC

## 2021-05-20 MED ORDER — SENNOSIDES-DOCUSATE SODIUM 8.6-50 MG PO TABS: 2.0000 | ORAL_TABLET | Freq: Every day | ORAL | 11 refills | Status: DC

## 2021-05-20 MED ORDER — POLYVINYL ALCOHOL 1.4 % OP SOLN: 1.0000 [drp] | OPHTHALMIC | 0 refills | Status: DC | PRN

## 2021-05-20 MED ORDER — ACETAMINOPHEN 325 MG PO TABS: 650.0000 mg | ORAL_TABLET | Freq: Four times a day (QID) | ORAL | 0 refills | Status: DC | PRN

## 2021-05-20 MED ORDER — MIDODRINE HCL 10 MG PO TABS: 10.0000 mg | ORAL_TABLET | Freq: Three times a day (TID) | ORAL | 1 refills | Status: DC

## 2021-05-20 MED ORDER — ALBUTEROL SULFATE HFA 108 (90 BASE) MCG/ACT IN AERS: 2.0000 | INHALATION_SPRAY | Freq: Four times a day (QID) | RESPIRATORY_TRACT | 1 refills | Status: DC | PRN

## 2021-05-20 NOTE — Discharge Summary (Signed)
Physician Discharge Summary   Patient: Kinley Dozier MRN: [id number] DOB: 12/05/1990  Admit date:     05/14/2021  Discharge date: 05/20/21  Discharge Physician: Shon Hale   PCP: Benita Stabile, MD   Recommendations at discharge:  1) routine Foley catheter care--- Foley catheter to be changed at least once every 3 to 4 weeks 2) repeat CBC and BMP blood test within a week  Discharge Diagnoses: Active Problems:   AKI (acute kidney injury) (HCC)   Sepsis secondary to UTI (HCC)   Bacteremia   Abnormal finding of diagnostic imaging--Abnormal CT AP   MS (multiple sclerosis) (HCC)   Wheelchair dependence   Leukocytosis   Acute on chronic urinary retention   Pressure injury of skin   Hypotension   Chest pain   Chronic anemia  Resolved Problems:   * No resolved hospital problems. *   Hospital Course:  Per HPI:  Kathlee Barnhardt  is a 31 y.o. 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 -ED patient was noted to be hypotensive, with significant leukocytosis at 47, repeat lactic acid was elevated at 3, her UA was strongly positive, Foley catheter was exchanged in ED, she was started empirically on vancomycin and Zosyn, hypotension responded to fluid boluses, as well creatinine elevated at 1.47  ED: Blood pressure (!) 81/56, pulse (!) 106, temperature 100.2 F (37.9 C), temperature source Oral, resp. rate (!) 21, height  (1.6 m), weight 81.6 kg, SpO2 100 %. WBC: 47.7   BMP BUN 32 creatinine 1.48 UA: Negative nitrite, large leukocyte esterase,  WBCs  Assessment and Plan: Abnormal finding of diagnostic imaging--Abnormal CT AP- (present on admission) - CT abdomen and pelvis reviewed with ID physician, Radiologist recommended MRI of the abdomen to rule out underlying neoplasm -MRI abdomen without additional new concerning findings specifically no evidence of malignancy abscesses or hydronephrosis  Bacteremia- (present on admission) - Blood cultures growing E. coli and Enterobacter from 05-14-21 -Repeated blood cultures 05/17/2021 >> no growth to date -Urine culture >> Morganella, Proteus----??? urine organisms contamination the patient with chronic indwelling Foley -Treated with Rocephin, transitioned to cefepime on 05/17/2021 -Discussion with ID physician Dr. Algis Liming recommends switching to [facility] on 05/19/2021 for additional 5 days to complete treatment course   Sepsis secondary to UTI (HCC) - - Blood cultures growing E. coli and Enterobacter from 05-14-21 -Repeated blood cultures 05/17/2021 >> no growth to date -Urine culture >> Morganella, Proteus----??? urine organisms contamination the patient with chronic indwelling Foley -Treated with Rocephin, transition to cefepime on 05/17/2021 -Discussion with ID physician Dr. Algis Liming recommends switching to [facility] on 05/19/2021 for additional 5 days to complete treatment course -Sepsis pathophysiology resolved   AKI (acute kidney injury) (HCC)- (present on admission) - Resolved - Likely due to sepsis, UTI, urinary retention -Foley catheter on initial presentation thought to be clogged, was replaced in ED on 05/15/21 -Status post IV fluid resuscitation -Creatinine -normalized  Chronic anemia Suspect multifactorial including nutritional etiology -Hgb stable at this time above 10  Chest pain - Brief episode-resolved -EKG without acute findings -Remains chest pain-free  Hypotension- (present on admission) -Blood pressure remained  soft -Off pressors-Levophed -Hypotensive likely related to  autonomic system due to advanced MS Was exacerbated by sepsis -Continue midodrine   Pressure injury of skin- (present on admission) - Per nurses evaluation, continue nursing care, wound care -Continue nursing care, -Foley in situ for urinary retention and to help prevent skin breakdown  Acute on chronic urinary retention- (present on admission) - Due to MS, neurogenic bladder -Chronic indwelling urine catheter, was changed in ER on  05/15/2021 -Clear urine with good output noted  -1routine Foley catheter care--- Foley catheter to be changed at least once every 3 to 4 weeks 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   albuterol 108 (90 Base) MCG/ACT inhaler Commonly known as: VENTOLIN HFA Inhale 2 puffs into the lungs every 6 (six) hours as needed for wheezing or shortness of breath (cough). What changed:  how much to take reasons to take this   baclofen 10 MG tablet Commonly known as: LIORESAL Take 1/2 to 1 pill po tid for Muscle spasm What changed: additional instructions   cefadroxil 500 MG capsule Commonly known as: DURICEF Take 2 capsules (1,000 mg total) by mouth 2 (two) times daily for 5 days.   lactobacillus Pack Take 1 packet (1 g total) by mouth 3 (three) times daily with meals.   midodrine 10 MG tablet Commonly known as: PROAMATINE Take 1 tablet (10 mg total) by mouth 3 (three) times daily with meals.   pantoprazole 40 MG tablet Commonly known as: PROTONIX Take 1 tablet (40 mg total) by mouth daily. Start taking on: May 21, 2021   polyvinyl alcohol 1.4 % ophthalmic solution Commonly known as: LIQUIFILM TEARS Place 1 drop into both eyes as needed for dry eyes.   senna-docusate 8.6-50 MG tablet Commonly known as: Senokot-S Take 2 tablets by mouth at bedtime.         Discharge Exam: Filed Weights   05/14/21 1030 05/15/21 0500 05/17/21 0400  Weight: 81.6 kg 65.7 kg 69.6 kg  Condition at discharge: good  The results of significant diagnostics from this hospitalization (including imaging, microbiology, ancillary and laboratory) are listed below for reference.   Imaging Studies: CT Head  Wo Contrast  Result Date: 05/14/2021 CLINICAL DATA:  Headache, new or worsening, neuro deficit (Age 25-49y) EXAM: CT HEAD WITHOUT CONTRAST TECHNIQUE: Contiguous axial images were obtained from the base of the skull through the vertex without intravenous contrast. RADIATION DOSE REDUCTION: This exam was performed according to the departmental dose-optimization program which includes automated exposure control, adjustment of the mA and/or kV according to patient  size and/or use of iterative reconstruction technique. COMPARISON:  2014 FINDINGS: Brain: There is no acute intracranial hemorrhage, mass effect, or edema. Gray-white differentiation is preserved. Patchy and confluent areas of low density are present in periventricular white matter compatible with history of multiple sclerosis. Burden has mildly increased in comparison to the remote study. There is no extra-axial fluid collection. Ventricles and sulci are stable in size and configuration. There is absence of the septum pellucidum. Vascular: No hyperdense vessel or unexpected calcification. Skull: Calvarium is unremarkable. Sinuses/Orbits: No acute finding. Other: None. IMPRESSION: No acute intracranial abnormality. White matter changes consistent with history of multiple sclerosis. Electronically Signed   By: Guadlupe Spanish M.D.   On: 05/14/2021 15:33   MR ABDOMEN W WO CONTRAST  Result Date: 05/19/2021 CLINICAL DATA:  Indeterminate renal and hepatic lesions on recent CT. Sepsis. EXAM: MRI ABDOMEN WITHOUT AND WITH CONTRAST TECHNIQUE: Multiplanar multisequence MR imaging of the abdomen was performed both before and after the administration of intravenous contrast. CONTRAST:  8mL GADAVIST GADOBUTROL 1 MMOL/ML IV SOLN COMPARISON:  CT on 05/18/2021 FINDINGS: Lower chest: Right lower lobe airspace disease and small bilateral pleural effusions are again seen. Hepatobiliary: Image degradation by respiratory motion artifact noted. No hepatic masses identified. No evidence of steatosis. Gallbladder is unremarkable. No evidence of biliary ductal dilatation. Pancreas:  No mass or inflammatory changes. Spleen:  Within normal limits in size and appearance. Adrenals/Urinary Tract: Left kidney is normal in appearance. A subtle ill-defined area of swelling, decreased enhancement, and restricted diffusion is seen in the anterior mid pole of the right kidney, which most likely represents pyelonephritis. No solid renal mass or renal  abscess identified. No evidence of hydronephrosis. Stomach/Bowel: Unremarkable. Vascular/Lymphatic: No pathologically enlarged lymph nodes identified. No acute vascular findings. Other:  None. Musculoskeletal:  No suspicious bone lesions identified. IMPRESSION: Ill-defined area of swelling and decreased enhancement in the right kidney, most likely due to pyelonephritis. No evidence of renal abscess or hydronephrosis. Recommend continued follow-up by abdomen CT without and with contrast to confirm resolution. Previously seen liver lesion is not visualized on this study, possibly due to its small size and respiratory motion artifact on this exam. Recommend continued attention on follow-up CT. Stable right lower lobe airspace disease and small bilateral pleural effusions. Electronically Signed   By: Danae Orleans M.D.   On: 05/19/2021 16:37   CT ABDOMEN PELVIS W CONTRAST  Result Date: 05/18/2021 CLINICAL DATA:  Sepsis. EXAM: CT ABDOMEN AND PELVIS WITH CONTRAST TECHNIQUE: Multidetector CT imaging of the abdomen and pelvis was performed using the standard protocol following bolus administration of intravenous contrast. RADIATION DOSE REDUCTION: This exam was performed according to the departmental dose-optimization program which includes automated exposure control, adjustment of the mA and/or kV according to patient size and/or use of iterative reconstruction technique. CONTRAST:  OMNIPAQUE IOHEXOL 300 MG/ML  SOLN COMPARISON:  May 14, 2021. FINDINGS: Lower chest: Right lower lobe airspace opacity is noted consistent with pneumonia. Small bilateral pleural effusions are noted. Hepatobiliary: Cholelithiasis is noted. No biliary dilatation is noted. 11 mm enhancing focus is noted anteriorly in medial  segment of left hepatic lobe. Pancreas: Unremarkable. No pancreatic ductal dilatation or surrounding inflammatory changes. Spleen: Normal in size without focal abnormality. Adrenals/Urinary Tract: Adrenal glands  appear normal. No renal or ureteral calculi are noted. No hydronephrosis or renal obstruction is noted. Urinary bladder is decompressed secondary to Foley catheter. Ill-defined hypodense area is noted anteriorly in the midpole the right kidney which may represent focal pyelonephritis, but neoplasm cannot be excluded. Stomach/Bowel: The stomach appears normal. There is no evidence of bowel obstruction or inflammation. Vascular/Lymphatic: No significant vascular findings are present. No enlarged abdominal or pelvic lymph nodes. Reproductive: Uterus and bilateral adnexa are unremarkable. Other: No abdominal wall hernia or abnormality. No abdominopelvic ascites. Musculoskeletal: No acute or significant osseous findings. IMPRESSION: Right lower lobe airspace opacity is noted consistent with pneumonia. Small bilateral pleural effusions are noted as well. Ill-defined hyperdense area is noted anteriorly in midpole of the right kidney which may represent focal pyelonephritis, but neoplasm cannot be excluded. Also noted is 11 mm enhancing abnormality in left hepatic lobe which may represent hemangioma, but neoplasm once again cannot be excluded. When the patient is clinically stable and able to follow directions and hold their breath (preferably as an outpatient) further evaluation with dedicated abdominal MRI should be considered. Cholelithiasis. Electronically Signed   By: Lupita Raider M.D.   On: 05/18/2021 15:25   DG CHEST PORT 1 VIEW  Result Date: 05/16/2021 CLINICAL DATA:  Chest pain EXAM: PORTABLE CHEST 1 VIEW COMPARISON:  05/14/2021 FINDINGS: New patchy infiltrates are seen in both lower lung fields, more so on the right side. Costophrenic angles are clear. There is no pneumothorax. IMPRESSION: There are new patchy infiltrates in both lower lung fields, more so on the right side suggesting multifocal pneumonia. Electronically Signed   By: Ernie Avena M.D.   On: 05/16/2021 12:35   DG Chest Port 1  View  Result Date: 05/14/2021 CLINICAL DATA:  Headaches for 2 days. EXAM: PORTABLE CHEST 1 VIEW COMPARISON:  02/23/2021 FINDINGS: The heart size and mediastinal contours are within normal limits. Both lungs are clear. The visualized skeletal structures are unremarkable. IMPRESSION: No active disease. Electronically Signed   By: Elige Ko M.D.   On: 05/14/2021 14:36   CT RENAL STONE STUDY  Result Date: 05/14/2021 CLINICAL DATA:  Sepsis, urinary retention, indwelling Foley catheter, generalized weakness and chills EXAM: CT ABDOMEN AND PELVIS WITHOUT CONTRAST TECHNIQUE: Multidetector CT imaging of the abdomen and pelvis was performed following the standard protocol without IV contrast. RADIATION DOSE REDUCTION: This exam was performed according to the departmental dose-optimization program which includes automated exposure control, adjustment of the mA and/or kV according to patient size and/or use of iterative reconstruction technique. COMPARISON:  03/07/2021 FINDINGS: Lower chest: No acute pleural or parenchymal lung disease. Hepatobiliary: Calcified gallstones without evidence of cholecystitis. Unremarkable unenhanced appearance of the liver. Pancreas: Unremarkable unenhanced appearance. Spleen: Unremarkable unenhanced appearance. Adrenals/Urinary Tract: Bladder is decompressed with a Foley catheter. No urinary tract calculi or obstructive uropathy within either kidney. Evaluation of the renal parenchyma is limited without IV contrast. The adrenals are stable. Stomach/Bowel: No bowel obstruction or ileus. Normal appendix right lower quadrant. No bowel wall thickening or inflammatory change. Vascular/Lymphatic: No significant vascular findings are present. No enlarged abdominal or pelvic lymph nodes. Reproductive: Uterus and bilateral adnexa are unremarkable. Other: Trace free fluid in the pelvis may be physiologic. No free intraperitoneal gas. No abdominal wall hernia. Musculoskeletal: No acute or destructive  bony lesions. Reconstructed images demonstrate no additional  findings. IMPRESSION: 1. Cholelithiasis without cholecystitis. 2. No evidence of urinary tract calculi or obstructive uropathy. Indwelling Foley catheter decompresses the bladder. 3. Trace pelvic free fluid, likely physiologic. Electronically Signed   By: Sharlet Salina M.D.   On: 05/14/2021 17:14    Microbiology: Results for orders placed or performed during the hospital encounter of 05/14/21  Blood Culture (routine x 2)     Status: Abnormal   Collection Time: 05/14/21  2:09 PM   Specimen: BLOOD LEFT HAND  Result Value Ref Range Status   Specimen Description   Final    BLOOD LEFT HAND Performed at [facility], [address] [place], [place] [address]    Special Requests   Final    BOTTLES DRAWN AEROBIC AND ANAEROBIC Blood Culture results may not be optimal due to an inadequate volume of blood received in culture bottles Performed at [facility], [address] [place], [place] [address]    Culture  Setup Time   Final    GRAM NEGATIVE RODS IN BOTH AEROBIC AND ANAEROBIC BOTTLES Gram Stain Report Called to,Read Back By and Verified With: ALLEN,T@[id number]  BY MATTHEWS, B 2.3.23 CRITICAL RESULT CALLED TO, READ BACK BY AND VERIFIED WITH: S HURTH PHARMD @1110  05/15/21 EB Performed at [facility], [address] [place], [place] [address]    Culture ESCHERICHIA COLI (A)  Final   Report Status 05/17/2021 FINAL  Final   Organism ID, Bacteria ESCHERICHIA COLI  Final      Susceptibility   Escherichia coli - MIC*    AMPICILLIN 16 INTERMEDIATE Intermediate     CEFAZOLIN <=4 SENSITIVE Sensitive     CEFEPIME <=0.12 SENSITIVE Sensitive     CEFTAZIDIME <=1 SENSITIVE Sensitive     CEFTRIAXONE <=0.25 SENSITIVE Sensitive     CIPROFLOXACIN >=4 RESISTANT Resistant     GENTAMICIN <=1 SENSITIVE Sensitive     IMIPENEM <=0.25 SENSITIVE Sensitive     TRIMETH/SULFA <=20 SENSITIVE Sensitive     AMPICILLIN/SULBACTAM 8 SENSITIVE Sensitive      PIP/TAZO <=4 SENSITIVE Sensitive     * ESCHERICHIA COLI  Blood Culture ID Panel (Reflexed)     Status: Abnormal   Collection Time: 05/14/21  2:09 PM  Result Value Ref Range Status   Enterococcus faecalis NOT DETECTED NOT DETECTED Final   Enterococcus Faecium NOT DETECTED NOT DETECTED Final   Listeria monocytogenes NOT DETECTED NOT DETECTED Final   Staphylococcus species NOT DETECTED NOT DETECTED Final   Staphylococcus aureus (BCID) NOT DETECTED NOT DETECTED Final   Staphylococcus epidermidis NOT DETECTED NOT DETECTED Final   Staphylococcus lugdunensis NOT DETECTED NOT DETECTED Final   Streptococcus species NOT DETECTED NOT DETECTED Final   Streptococcus agalactiae NOT DETECTED NOT DETECTED Final   Streptococcus pneumoniae NOT DETECTED NOT DETECTED Final   Streptococcus pyogenes NOT DETECTED NOT DETECTED Final   A.calcoaceticus-baumannii NOT DETECTED NOT DETECTED Final   Bacteroides fragilis NOT DETECTED NOT DETECTED Final   Enterobacterales DETECTED (A) NOT DETECTED Final    Comment: Enterobacterales represent a large order of gram negative bacteria, not a single organism. CRITICAL RESULT CALLED TO, READ BACK BY AND VERIFIED WITH: S HURTH PHARMD @1110  05/15/21 EB    Enterobacter cloacae complex NOT DETECTED NOT DETECTED Final   Escherichia coli DETECTED (A) NOT DETECTED Final    Comment: CRITICAL RESULT CALLED TO, READ BACK BY AND VERIFIED WITH: S HURTH PHARMD @1110  05/15/21 EB    Klebsiella aerogenes NOT DETECTED NOT DETECTED Final   Klebsiella oxytoca NOT DETECTED NOT  DETECTED Final   Klebsiella pneumoniae NOT DETECTED NOT DETECTED Final   Proteus species NOT DETECTED NOT DETECTED Final   Salmonella species NOT DETECTED NOT DETECTED Final   Serratia marcescens NOT DETECTED NOT DETECTED Final   Haemophilus influenzae NOT DETECTED NOT DETECTED Final   Neisseria meningitidis NOT DETECTED NOT DETECTED Final   Pseudomonas aeruginosa NOT DETECTED NOT DETECTED Final   Stenotrophomonas  maltophilia NOT DETECTED NOT DETECTED Final   Candida albicans NOT DETECTED NOT DETECTED Final   Candida auris NOT DETECTED NOT DETECTED Final   Candida glabrata NOT DETECTED NOT DETECTED Final   Candida krusei NOT DETECTED NOT DETECTED Final   Candida parapsilosis NOT DETECTED NOT DETECTED Final   Candida tropicalis NOT DETECTED NOT DETECTED Final   Cryptococcus neoformans/gattii NOT DETECTED NOT DETECTED Final   CTX-M ESBL NOT DETECTED NOT DETECTED Final   Carbapenem resistance IMP NOT DETECTED NOT DETECTED Final   Carbapenem resistance KPC NOT DETECTED NOT DETECTED Final   Carbapenem resistance NDM NOT DETECTED NOT DETECTED Final   Carbapenem resist OXA 48 LIKE NOT DETECTED NOT DETECTED Final   Carbapenem resistance VIM NOT DETECTED NOT DETECTED Final    Comment: Performed at [facility], [address], [place], [place] [address]  Blood Culture (routine x 2)     Status: Abnormal   Collection Time: 05/14/21  2:23 PM   Specimen: BLOOD RIGHT HAND  Result Value Ref Range Status   Specimen Description   Final    BLOOD RIGHT HAND Performed at [facility], [address] [place], [place] [address]    Special Requests   Final    BOTTLES DRAWN AEROBIC AND ANAEROBIC Blood Culture results may not be optimal due to an inadequate volume of blood received in culture bottles Performed at [facility], [address], [place], [place] [address]    Culture  Setup Time   Final    GRAM NEGATIVE RODS AEROBIC BOTTLE ONLY CRITICAL VALUE NOTED.  VALUE IS CONSISTENT WITH PREVIOUSLY REPORTED AND CALLED VALUE. Performed at [facility], [address], [place], [place] [address]    Culture (A)  Final    ESCHERICHIA COLI SUSCEPTIBILITIES PERFORMED ON PREVIOUS CULTURE WITHIN THE LAST 5 DAYS. Performed at [facility], [address] [place], [place] [address]    Report Status 05/17/2021 FINAL  Final  Resp Panel by RT-PCR (Flu A&B, Covid) Nasopharyngeal Swab     Status: None   Collection  Time: 05/14/21  2:30 PM   Specimen: Nasopharyngeal Swab; Nasopharyngeal(NP) swabs in vial transport medium  Result Value Ref Range Status   SARS Coronavirus 2 by RT PCR NEGATIVE NEGATIVE Final    Comment: (NOTE) SARS-CoV-2 target nucleic acids are NOT DETECTED.  The SARS-CoV-2 RNA is generally detectable in upper respiratory specimens during the acute phase of infection. The lowest concentration of SARS-CoV-2 viral copies this assay can detect is 138 copies/mL. A negative result does not preclude SARS-Cov-2 infection and should not be used as the sole basis for treatment or other patient management decisions. A negative result may occur with  improper specimen collection/handling, submission of specimen other than nasopharyngeal swab, presence of viral mutation(s) within the areas targeted by this assay, and inadequate number of viral copies(<138 copies/mL). A negative result must be combined with clinical observations, patient history, and epidemiological information. The expected result is Negative.  Fact Sheet for Patients:  BloggerCourse.com  Fact Sheet for Healthcare Providers:  SeriousBroker.it  This test is no t yet  approved or cleared by the [place] and  has been authorized for detection and/or diagnosis of SARS-CoV-2 by FDA under an Emergency Use Authorization (EUA). This EUA will remain  in effect (meaning this test can be used) for the duration of the COVID-19 declaration under Section 564(b)(1) of the Act, 21 U.S.C.section 360bbb-3(b)(1), unless the authorization is terminated  or revoked sooner.       Influenza A by PCR NEGATIVE NEGATIVE Final   Influenza B by PCR NEGATIVE NEGATIVE Final    Comment: (NOTE) The Xpert Xpress SARS-CoV-2/FLU/RSV plus assay is intended as an aid in the diagnosis of influenza from Nasopharyngeal swab specimens and should not be used as a sole basis for treatment. Nasal washings  and aspirates are unacceptable for Xpert Xpress SARS-CoV-2/FLU/RSV testing.  Fact Sheet for Patients: BloggerCourse.com  Fact Sheet for Healthcare Providers: SeriousBroker.it  This test is not yet approved or cleared by the [place] FDA and has been authorized for detection and/or diagnosis of SARS-CoV-2 by FDA under an Emergency Use Authorization (EUA). This EUA will remain in effect (meaning this test can be used) for the duration of the COVID-19 declaration under Section 564(b)(1) of the Act, 21 U.S.C. section 360bbb-3(b)(1), unless the authorization is terminated or revoked.  Performed at [facility], [address], [place], [place] [address]   Urine Culture     Status: Abnormal   Collection Time: 05/14/21  2:34 PM   Specimen: In/Out Cath Urine  Result Value Ref Range Status   Specimen Description   Final    IN/OUT CATH URINE Performed at [facility], [address], [place], [place] [address]    Special Requests   Final    NONE Performed at [facility], [address] [place], [place] [address]    Culture (A)  Final    >=100,000 COLONIES/mL MORGANELLA MORGANII 30,000 COLONIES/mL PROTEUS PENNERI    Report Status 05/17/2021 FINAL  Final   Organism ID, Bacteria MORGANELLA MORGANII (A)  Final   Organism ID, Bacteria PROTEUS PENNERI (A)  Final      Susceptibility   Morganella morganii - MIC*    AMPICILLIN >=32 RESISTANT Resistant     CEFAZOLIN >=64 RESISTANT Resistant     CIPROFLOXACIN <=0.25 SENSITIVE Sensitive     GENTAMICIN <=1 SENSITIVE Sensitive     IMIPENEM 2 SENSITIVE Sensitive     NITROFURANTOIN 128 RESISTANT Resistant     TRIMETH/SULFA <=20 SENSITIVE Sensitive     AMPICILLIN/SULBACTAM 16 INTERMEDIATE Intermediate     PIP/TAZO <=4 SENSITIVE Sensitive     * >=100,000 COLONIES/mL MORGANELLA MORGANII   Proteus penneri - MIC*    AMPICILLIN >=32 RESISTANT Resistant     CEFAZOLIN >=64 RESISTANT Resistant      CEFEPIME <=0.12 SENSITIVE Sensitive     CEFTRIAXONE <=0.25 SENSITIVE Sensitive     CIPROFLOXACIN <=0.25 SENSITIVE Sensitive     GENTAMICIN <=1 SENSITIVE Sensitive     IMIPENEM 2 SENSITIVE Sensitive     NITROFURANTOIN RESISTANT Resistant     TRIMETH/SULFA <=20 SENSITIVE Sensitive     AMPICILLIN/SULBACTAM 8 SENSITIVE Sensitive     PIP/TAZO <=4 SENSITIVE Sensitive     * 30,000 COLONIES/mL PROTEUS PENNERI  MRSA Next Gen by PCR, Nasal     Status: None   Collection Time: 05/14/21  6:00 PM   Specimen: Nasal Mucosa; Nasal Swab  Result Value Ref Range Status   MRSA by PCR Next Gen NOT DETECTED NOT DETECTED Final    Comment: (NOTE) The GeneXpert MRSA  Assay (FDA approved for NASAL specimens only), is one component of a comprehensive MRSA colonization surveillance program. It is not intended to diagnose MRSA infection nor to guide or monitor treatment for MRSA infections. Test performance is not FDA approved in patients less than 1 years old. Performed at [facility], [address] [place], [place] [address]   Culture, blood (routine x 2)     Status: None (Preliminary result)   Collection Time: 05/17/21  8:04 AM   Specimen: BLOOD RIGHT HAND  Result Value Ref Range Status   Specimen Description   Final    BLOOD RIGHT HAND BOTTLES DRAWN AEROBIC AND ANAEROBIC   Special Requests   Final    Blood Culture results may not be optimal due to an excessive volume of blood received in culture bottles   Culture   Final    NO GROWTH 3 DAYS Performed at [facility], [address] [place], [place] [address]    Report Status PENDING  Incomplete  Culture, blood (routine x 2)     Status: None (Preliminary result)   Collection Time: 05/17/21  8:08 AM   Specimen: Left Antecubital; Blood  Result Value Ref Range Status   Specimen Description   Final    LEFT ANTECUBITAL BOTTLES DRAWN AEROBIC AND ANAEROBIC   Special Requests   Final    Blood Culture results may not be optimal due to an excessive  volume of blood received in culture bottles   Culture   Final    NO GROWTH 3 DAYS Performed at [facility], [address], [place], [place] [address]    Report Status PENDING  Incomplete    Labs: CBC: Recent Labs  Lab 05/15/21 0415 05/16/21 0420 05/17/21 0626 05/18/21 0513 05/20/21 0937  WBC 25.1* 8.7 4.5 6.1 6.1  NEUTROABS  --  7.4 2.8 3.8  --   HGB 10.1* 10.7* 8.9* 9.4* 10.3*  HCT 30.8* 34.1* 27.7* 29.9* 32.4*  MCV 89.8 90.9 87.1 87.7 87.1  PLT 163 145* 144* 172 265   Basic Metabolic Panel: Recent Labs  Lab 05/15/21 0415 05/16/21 0420 05/17/21 0626 05/18/21 0513 05/20/21 0937  NA 137 136 136 137 138  K 3.4* 3.5 3.1* 3.2* 3.9  CL 107 106 103 105 105  CO2 GLUCOSE 106* 81 91 87 99  BUN 5*  CREATININE 0.74 0.72 0.60 0.63 0.66  CALCIUM 8.2* 8.3* 7.7* 7.8* 8.8*  MG  --   --   --  1.5*  --    Liver Function Tests: Recent Labs  Lab 05/14/21 1141 05/16/21 0420 05/17/21 0626 05/18/21 0513  AST 18 35 140* 76*  ALT 11 25 125* 110*  ALKPHOS 59 60 114 140*  BILITOT 0.7 0.3 0.2* 0.2*  PROT 8.2* 6.3* 5.7* 6.1*  ALBUMIN 3.7 2.6* 2.2* 2.4*   CBG: No results for input(s): GLUCAP in the last 168 hours.  Discharge time spent: greater than 30 minutes.  Signed: Shon Hale, MD [facility] 05/20/2021

## 2021-05-20 NOTE — TOC Transition Note (Addendum)
Transition of Care [facility]) - CM/SW Discharge Note   Patient Details  Name: Melissa West MRN: [id number] Date of Birth: Jan 22, 1991  [facility] CM/SW Contact:  Leitha Bleak, RN Phone Number: 05/20/2021, 11:01 AM   Clinical Narrative:   Patient discharging home today. After reaching out to multiple home health companies, patient denied for home heath. TOC called her mother to explain and suggested reaching out to family doctor for assistance. RN updated Mother is on her way to get education on how to care for Foley Cath.   Addendum: TOC was able to pair with another patient to get home health. Patient discharged home yesterday, orders have been placed. Misty Stanley with [facility] accepted the referral. TOC called to update, Victorino Dike- her mother.   Final next level of care: Other (comment) (Mother/Caregiver) Barriers to Discharge: No Home Care Agency will accept this patient   Patient Goals and CMS Choice Patient states their goals for this hospitalization and ongoing recovery are:: to go home.   Choice offered to / list presented to : Parent   Readmission Risk Interventions Readmission Risk Prevention Plan 05/20/2021 03/11/2021 03/10/2021  Post Dischage Appt - Complete -  Medication Screening - Complete Complete  Transportation Screening - Complete Complete  Home Care Screening Complete - -  Medication Review (RN CM) Complete - -  Some recent data might be hidden

## 2021-05-20 NOTE — Progress Notes (Signed)
Nurse educated patient and mother about foley catheter care, answered all questions; patient and mother acknowledged understanding.

## 2021-05-20 NOTE — Assessment & Plan Note (Signed)
Suspect multifactorial including nutritional etiology -Hgb stable at this time above 10

## 2021-05-20 NOTE — Discharge Instructions (Signed)
1) routine Foley catheter care--- Foley catheter to be changed at least once every 3 to 4 weeks 2) repeat CBC and BMP blood test within a week

## 2021-05-22 LAB — CULTURE, BLOOD (ROUTINE X 2)
Culture: NO GROWTH
Culture: NO GROWTH

## 2021-05-25 LAB — QUANTIFERON-TB GOLD PLUS
QuantiFERON Mitogen Value: >10 IU/mL
QuantiFERON Nil Value: 0.02 IU/mL
QuantiFERON TB1 Ag Value: 0.03 IU/mL
QuantiFERON TB2 Ag Value: 0.02 IU/mL
QuantiFERON-TB Gold Plus: NEGATIVE

## 2021-05-25 LAB — COMPREHENSIVE METABOLIC PANEL
ALT: 8 IU/L (ref 0–32)
AST: 13 IU/L (ref 0–40)
Albumin/Globulin Ratio: 1.2 (ref 1.2–2.2)
Albumin: 4.2 g/dL (ref 3.8–4.8)
Alkaline Phosphatase: 67 IU/L (ref 44–121)
BUN/Creatinine Ratio: 18 (ref 9–23)
BUN: 14 mg/dL (ref 6–20)
Bilirubin Total: <0.2 mg/dL (ref 0.0–1.2)
CO2: 18 mmol/L — ABNORMAL LOW (ref 20–29)
Calcium: 9.7 mg/dL (ref 8.7–10.2)
Chloride: 107 mmol/L — ABNORMAL HIGH (ref 96–106)
Creatinine, Ser: 0.77 mg/dL (ref 0.57–1.00)
Globulin, Total: 3.6 g/dL (ref 1.5–4.5)
Glucose: 78 mg/dL (ref 70–99)
Potassium: 4.3 mmol/L (ref 3.5–5.2)
Sodium: 143 mmol/L (ref 134–144)
Total Protein: 7.8 g/dL (ref 6.0–8.5)
eGFR: 105 mL/min/{1.73_m2} (ref >59)

## 2021-05-25 LAB — CBC WITH DIFFERENTIAL/PLATELET
Basophils Absolute: 0 10*3/uL (ref 0.0–0.2)
Basos: 0 %
EOS (ABSOLUTE): 0.1 10*3/uL (ref 0.0–0.4)
Eos: 1 %
Hematocrit: 40.2 % (ref 34.0–46.6)
Hemoglobin: 13.2 g/dL (ref 11.1–15.9)
Immature Grans (Abs): 0 10*3/uL (ref 0.0–0.1)
Immature Granulocytes: 0 %
Lymphocytes Absolute: 1.7 10*3/uL (ref 0.7–3.1)
Lymphs: 24 %
MCH: 28.4 pg (ref 26.6–33.0)
MCHC: 32.8 g/dL (ref 31.5–35.7)
MCV: 87 fL (ref 79–97)
Monocytes Absolute: 0.3 10*3/uL (ref 0.1–0.9)
Monocytes: 4 %
Neutrophils Absolute: 4.9 10*3/uL (ref 1.4–7.0)
Neutrophils: 71 %
Platelets: 260 10*3/uL (ref 150–450)
RBC: 4.64 x10E6/uL (ref 3.77–5.28)
RDW: 13.6 % (ref 11.7–15.4)
WBC: 6.9 10*3/uL (ref 3.4–10.8)

## 2021-05-25 LAB — HIV ANTIBODY (ROUTINE TESTING W REFLEX): HIV Screen 4th Generation wRfx: NONREACTIVE

## 2021-05-25 LAB — HEPATITIS B CORE ANTIBODY, TOTAL: Hep B Core Total Ab: NEGATIVE

## 2021-05-25 LAB — STRATIFY JCV(TM) AB W/INDEX
JCV Antibody by Inhibition: NEGATIVE
JCV Antibody: UNDETERMINED
JCV Index Value: 0.23

## 2021-05-25 LAB — IGG, IGA, IGM
IgA/Immunoglobulin A, Serum: 605 mg/dL — ABNORMAL HIGH (ref 87–352)
IgG (Immunoglobin G), Serum: 1765 mg/dL — ABNORMAL HIGH (ref 586–1602)
IgM (Immunoglobulin M), Srm: 102 mg/dL (ref 26–217)

## 2021-05-25 LAB — HEPATITIS B SURFACE ANTIBODY,QUALITATIVE: Hep B Surface Ab, Qual: NONREACTIVE

## 2021-05-25 LAB — HEPATITIS B SURFACE ANTIGEN: Hepatitis B Surface Ag: NEGATIVE

## 2021-05-25 NOTE — Telephone Encounter (Signed)
Just FYI no show again.

## 2021-05-27 ENCOUNTER — Telehealth: Payer: Self-pay | Admitting: *Deleted

## 2021-05-27 NOTE — Telephone Encounter (Signed)
Called and spoke w/ pt mother, Melissa West. Relayed results per Dr. Garth Bigness note. She verbalized understanding. They have decided to move forward with Kesimpta. Advised we will turn in start form/obtain insurance auth and to be on look out for phone call from company. She said they can contact her at [phone]. Aware we will reach out if any additional info needed throughout this process.

## 2021-05-27 NOTE — Telephone Encounter (Signed)
-----   Message from Asa Lente, MD sent at 05/26/2021  6:47 PM EST ----- She had been on Tysabri in the past.  The JCV antibody is negative so we can do this if she would like.    We had also discussed Kesimpta if she prefers to do self injectable instead of the infusions.  Lab work is fine to do this.  She was going to give the above 2 options more thought.  I would be fine for her to do either.

## 2021-05-29 ENCOUNTER — Emergency Department (HOSPITAL_COMMUNITY)
Admission: EM | Admit: 2021-05-29 | Discharge: 2021-05-30 | Disposition: A | Discharge status: Home / Self Care | Payer: Medicare HMO | Attending: Emergency Medicine | Admitting: Emergency Medicine

## 2021-05-29 ENCOUNTER — Other Ambulatory Visit: Payer: Self-pay

## 2021-05-29 ENCOUNTER — Emergency Department (HOSPITAL_COMMUNITY): Payer: Medicare HMO

## 2021-05-29 ENCOUNTER — Encounter (HOSPITAL_COMMUNITY): Payer: Self-pay

## 2021-05-29 DIAGNOSIS — Z79899 Other long term (current) drug therapy: Secondary | ICD-10-CM | POA: Insufficient documentation

## 2021-05-29 DIAGNOSIS — R109 Unspecified abdominal pain: Secondary | ICD-10-CM

## 2021-05-29 DIAGNOSIS — G4489 Other headache syndrome: Secondary | ICD-10-CM | POA: Diagnosis not present

## 2021-05-29 DIAGNOSIS — N1330 Unspecified hydronephrosis: Secondary | ICD-10-CM | POA: Insufficient documentation

## 2021-05-29 DIAGNOSIS — K8020 Calculus of gallbladder without cholecystitis without obstruction: Secondary | ICD-10-CM | POA: Insufficient documentation

## 2021-05-29 DIAGNOSIS — N134 Hydroureter: Secondary | ICD-10-CM | POA: Diagnosis not present

## 2021-05-29 DIAGNOSIS — R519 Headache, unspecified: Secondary | ICD-10-CM | POA: Diagnosis not present

## 2021-05-29 DIAGNOSIS — T839XXA Unspecified complication of genitourinary prosthetic device, implant and graft, initial encounter: Secondary | ICD-10-CM

## 2021-05-29 DIAGNOSIS — T83091A Other mechanical complication of indwelling urethral catheter, initial encounter: Secondary | ICD-10-CM | POA: Diagnosis not present

## 2021-05-29 DIAGNOSIS — R1084 Generalized abdominal pain: Secondary | ICD-10-CM | POA: Diagnosis not present

## 2021-05-29 DIAGNOSIS — Z743 Need for continuous supervision: Secondary | ICD-10-CM | POA: Diagnosis not present

## 2021-05-29 LAB — CBC
HCT: 38.1 % (ref 36.0–46.0)
Hemoglobin: 12.4 g/dL (ref 12.0–15.0)
MCH: 29 pg (ref 26.0–34.0)
MCHC: 32.5 g/dL (ref 30.0–36.0)
MCV: 89.2 fL (ref 80.0–100.0)
Platelets: 375 10*3/uL (ref 150–400)
RBC: 4.27 MIL/uL (ref 3.87–5.11)
RDW: 15.1 % (ref 11.5–15.5)
WBC: 7.7 10*3/uL (ref 4.0–10.5)
nRBC: 0 % (ref 0.0–0.2)

## 2021-05-29 LAB — COMPREHENSIVE METABOLIC PANEL
ALT: 26 U/L (ref 0–44)
AST: 24 U/L (ref 15–41)
Albumin: 3.6 g/dL (ref 3.5–5.0)
Alkaline Phosphatase: 82 U/L (ref 38–126)
Anion gap: 10 (ref 5–15)
BUN: 17 mg/dL (ref 6–20)
CO2: 22 mmol/L (ref 22–32)
Calcium: 9.4 mg/dL (ref 8.9–10.3)
Chloride: 103 mmol/L (ref 98–111)
Creatinine, Ser: 0.81 mg/dL (ref 0.44–1.00)
GFR, Estimated: >60 mL/min (ref >60)
Glucose, Bld: 96 mg/dL (ref 70–99)
Potassium: 4.5 mmol/L (ref 3.5–5.1)
Sodium: 135 mmol/L (ref 135–145)
Total Bilirubin: 1.3 mg/dL — ABNORMAL HIGH (ref 0.3–1.2)
Total Protein: 8.1 g/dL (ref 6.5–8.1)

## 2021-05-29 LAB — URINALYSIS, ROUTINE W REFLEX MICROSCOPIC
Bilirubin Urine: NEGATIVE
Glucose, UA: NEGATIVE mg/dL
Hgb urine dipstick: NEGATIVE
Ketones, ur: NEGATIVE mg/dL
Nitrite: NEGATIVE
Protein, ur: >=300 mg/dL — AB
Specific Gravity, Urine: 1.013 (ref 1.005–1.030)
pH: 8 (ref 5.0–8.0)

## 2021-05-29 LAB — LIPASE, BLOOD: Lipase: 27 U/L (ref 11–51)

## 2021-05-29 LAB — POC URINE PREG, ED: Preg Test, Ur: NEGATIVE

## 2021-05-29 MED ORDER — ACETAMINOPHEN 325 MG PO TABS
650.0000 mg | ORAL_TABLET | Freq: Once | ORAL | Status: AC
  Administered 2021-05-29: 650 mg via ORAL
  Filled 2021-05-29: qty 2

## 2021-05-29 MED ORDER — FENTANYL CITRATE PF 50 MCG/ML IJ SOSY
50.0000 ug | PREFILLED_SYRINGE | Freq: Once | INTRAMUSCULAR | Status: AC
  Administered 2021-05-29: 50 ug via INTRAVENOUS
  Filled 2021-05-29: qty 1

## 2021-05-29 MED ORDER — ONDANSETRON HCL 4 MG/2ML IJ SOLN
4.0000 mg | Freq: Once | INTRAMUSCULAR | Status: AC
Start: 2021-05-29 — End: 2021-05-29
  Administered 2021-05-29: 4 mg via INTRAVENOUS
  Filled 2021-05-29: qty 2

## 2021-05-29 MED ORDER — SODIUM CHLORIDE 0.9 % IV BOLUS
1000.0000 mL | Freq: Once | INTRAVENOUS | Status: AC
  Administered 2021-05-29: 1000 mL via INTRAVENOUS

## 2021-05-29 NOTE — ED Provider Notes (Signed)
Care assumed from [facility], patient with abdominal pain pending CT abdomen and pelvis.  CT scan is significant for hydronephrosis and bladder distention.  I have independently viewed the images, and agree with the radiologist's interpretation.  Patient was examined and bladder is noted to be quite distended.  I suspect that this is actually the source of her pain.  We will change her Foley catheter to see if she gets symptomatic improvement.  There was excellent drainage following new catheter placement, patient is feeling somewhat better.  She is discharged home with instructions to follow-up with PCP.  Results for orders placed or performed during the hospital encounter of 05/29/21  Lipase, blood  Result Value Ref Range   Lipase 27 11 - 51 U/L  Comprehensive metabolic panel  Result Value Ref Range   Sodium 135 135 - 145 mmol/L   Potassium 4.5 3.5 - 5.1 mmol/L   Chloride 103 98 - 111 mmol/L   CO2 22 22 - 32 mmol/L   Glucose, Bld 96 70 - 99 mg/dL   BUN 17 6 - 20 mg/dL   Creatinine, Ser 0.81 0.44 - 1.00 mg/dL   Calcium 9.4 8.9 - 10.3 mg/dL   Total Protein 8.1 6.5 - 8.1 g/dL   Albumin 3.6 3.5 - 5.0 g/dL   AST 24 15 - 41 U/L   ALT 26 0 - 44 U/L   Alkaline Phosphatase 82 38 - 126 U/L   Total Bilirubin 1.3 (H) 0.3 - 1.2 mg/dL   GFR, Estimated >60 >60 mL/min   Anion gap 10 5 - 15  CBC  Result Value Ref Range   WBC 7.7 4.0 - 10.5 K/uL   RBC 4.27 3.87 - 5.11 MIL/uL   Hemoglobin 12.4 12.0 - 15.0 g/dL   HCT 38.1 36.0 - 46.0 %   MCV 89.2 80.0 - 100.0 fL   MCH 29.0 26.0 - 34.0 pg   MCHC 32.5 30.0 - 36.0 g/dL   RDW 15.1 11.5 - 15.5 %   Platelets 375 150 - 400 K/uL   nRBC 0.0 0.0 - 0.2 %  Urinalysis, Routine w reflex microscopic Urine, Suprapubic  Result Value Ref Range   Color, Urine YELLOW YELLOW   APPearance TURBID (A) CLEAR   Specific Gravity, Urine 1.013 1.005 - 1.030   pH 8.0 5.0 - 8.0   Glucose, UA NEGATIVE NEGATIVE mg/dL   Hgb urine dipstick NEGATIVE NEGATIVE    Bilirubin Urine NEGATIVE NEGATIVE   Ketones, ur NEGATIVE NEGATIVE mg/dL   Protein, ur >=300 (A) NEGATIVE mg/dL   Nitrite NEGATIVE NEGATIVE   Leukocytes,Ua MODERATE (A) NEGATIVE   RBC / HPF 0-5 0 - 5 RBC/hpf   WBC, UA 6-10 0 - 5 WBC/hpf   Bacteria, UA RARE (A) NONE SEEN   Squamous Epithelial / LPF 0-5 0 - 5   Mucus PRESENT    Ca Oxalate Crys, UA PRESENT   POC urine preg, ED  Result Value Ref Range   Preg Test, Ur NEGATIVE NEGATIVE   CT ABDOMEN PELVIS W WO CONTRAST  Result Date: 05/30/2021 CLINICAL DATA:  Generalized abdominal cramping with history of recent sepsis secondary to UTI. EXAM: CT ABDOMEN AND PELVIS WITHOUT AND WITH CONTRAST TECHNIQUE: Multidetector CT imaging of the abdomen and pelvis was performed following the standard protocol before and following the bolus administration of intravenous contrast. RADIATION DOSE REDUCTION: This exam was performed according to the departmental dose-optimization program which includes automated exposure control, adjustment of the mA and/or kV according to patient  size and/or use of iterative reconstruction technique. CONTRAST:  164mL OMNIPAQUE IOHEXOL 300 MG/ML  SOLN COMPARISON:  May 18, 2021 FINDINGS: Lower chest: No acute abnormality. Hepatobiliary: No focal liver abnormality is seen. Multiple subcentimeter gallstones are seen within the lumen of an otherwise normal-appearing gallbladder. Pancreas: Unremarkable. No pancreatic ductal dilatation or surrounding inflammatory changes. Spleen: Normal in size without focal abnormality. Adrenals/Urinary Tract: Adrenal glands are unremarkable. Kidneys are normal in size, without focal lesions. Moderate severity bilateral hydronephrosis and hydroureter are seen, right greater than left. No renal calculi are identified. A Foley catheter is seen within the lumen of a markedly distended urinary bladder. Subsequent mass effect on the uterus is seen. Stomach/Bowel: Stomach is within normal limits. The appendix is  not clearly identified. No evidence of bowel wall thickening, distention, or inflammatory changes. Vascular/Lymphatic: No significant vascular findings are present. No enlarged abdominal or pelvic lymph nodes. Reproductive: The uterus is positioned to the right of midline with uterine fundus extending into the right lower quadrant. The bilateral adnexa are unremarkable. Other: No abdominal wall hernia or abnormality. No abdominopelvic ascites. Musculoskeletal: No acute or significant osseous findings. IMPRESSION: 1. Moderate severity bilateral hydronephrosis and hydroureter, right greater than left, without evidence of renal calculi. 2. Markedly distended urinary bladder. 3. Cholelithiasis. Electronically Signed   By: Virgina Norfolk M.D.   On: 05/30/2021 00:38   CT Head Wo Contrast  Result Date: 05/14/2021 CLINICAL DATA:  Headache, new or worsening, neuro deficit (Age 67-49y) EXAM: CT HEAD WITHOUT CONTRAST TECHNIQUE: Contiguous axial images were obtained from the base of the skull through the vertex without intravenous contrast. RADIATION DOSE REDUCTION: This exam was performed according to the departmental dose-optimization program which includes automated exposure control, adjustment of the mA and/or kV according to patient size and/or use of iterative reconstruction technique. COMPARISON:  2014 FINDINGS: Brain: There is no acute intracranial hemorrhage, mass effect, or edema. Gray-white differentiation is preserved. Patchy and confluent areas of low density are present in periventricular white matter compatible with history of multiple sclerosis. Burden has mildly increased in comparison to the remote study. There is no extra-axial fluid collection. Ventricles and sulci are stable in size and configuration. There is absence of the septum pellucidum. Vascular: No hyperdense vessel or unexpected calcification. Skull: Calvarium is unremarkable. Sinuses/Orbits: No acute finding. Other: None. IMPRESSION: No  acute intracranial abnormality. White matter changes consistent with history of multiple sclerosis. Electronically Signed   By: Macy Mis M.D.   On: 05/14/2021 15:33   MR ABDOMEN W WO CONTRAST  Result Date: 05/19/2021 CLINICAL DATA:  Indeterminate renal and hepatic lesions on recent CT. Sepsis. EXAM: MRI ABDOMEN WITHOUT AND WITH CONTRAST TECHNIQUE: Multiplanar multisequence MR imaging of the abdomen was performed both before and after the administration of intravenous contrast. CONTRAST:  51mL GADAVIST GADOBUTROL 1 MMOL/ML IV SOLN COMPARISON:  CT on 05/18/2021 FINDINGS: Lower chest: Right lower lobe airspace disease and small bilateral pleural effusions are again seen. Hepatobiliary: Image degradation by respiratory motion artifact noted. No hepatic masses identified. No evidence of steatosis. Gallbladder is unremarkable. No evidence of biliary ductal dilatation. Pancreas:  No mass or inflammatory changes. Spleen:  Within normal limits in size and appearance. Adrenals/Urinary Tract: Left kidney is normal in appearance. A subtle ill-defined area of swelling, decreased enhancement, and restricted diffusion is seen in the anterior mid pole of the right kidney, which most likely represents pyelonephritis. No solid renal mass or renal abscess identified. No evidence of hydronephrosis. Stomach/Bowel: Unremarkable. Vascular/Lymphatic: No pathologically enlarged  lymph nodes identified. No acute vascular findings. Other:  None. Musculoskeletal:  No suspicious bone lesions identified. IMPRESSION: Ill-defined area of swelling and decreased enhancement in the right kidney, most likely due to pyelonephritis. No evidence of renal abscess or hydronephrosis. Recommend continued follow-up by abdomen CT without and with contrast to confirm resolution. Previously seen liver lesion is not visualized on this study, possibly due to its small size and respiratory motion artifact on this exam. Recommend continued attention on  follow-up CT. Stable right lower lobe airspace disease and small bilateral pleural effusions. Electronically Signed   By: Marlaine Hind M.D.   On: 05/19/2021 16:37   CT ABDOMEN PELVIS W CONTRAST  Result Date: 05/18/2021 CLINICAL DATA:  Sepsis. EXAM: CT ABDOMEN AND PELVIS WITH CONTRAST TECHNIQUE: Multidetector CT imaging of the abdomen and pelvis was performed using the standard protocol following bolus administration of intravenous contrast. RADIATION DOSE REDUCTION: This exam was performed according to the departmental dose-optimization program which includes automated exposure control, adjustment of the mA and/or kV according to patient size and/or use of iterative reconstruction technique. CONTRAST:  139mL OMNIPAQUE IOHEXOL 300 MG/ML  SOLN COMPARISON:  May 14, 2021. FINDINGS: Lower chest: Right lower lobe airspace opacity is noted consistent with pneumonia. Small bilateral pleural effusions are noted. Hepatobiliary: Cholelithiasis is noted. No biliary dilatation is noted. 11 mm enhancing focus is noted anteriorly in medial segment of left hepatic lobe. Pancreas: Unremarkable. No pancreatic ductal dilatation or surrounding inflammatory changes. Spleen: Normal in size without focal abnormality. Adrenals/Urinary Tract: Adrenal glands appear normal. No renal or ureteral calculi are noted. No hydronephrosis or renal obstruction is noted. Urinary bladder is decompressed secondary to Foley catheter. Ill-defined hypodense area is noted anteriorly in the midpole the right kidney which may represent focal pyelonephritis, but neoplasm cannot be excluded. Stomach/Bowel: The stomach appears normal. There is no evidence of bowel obstruction or inflammation. Vascular/Lymphatic: No significant vascular findings are present. No enlarged abdominal or pelvic lymph nodes. Reproductive: Uterus and bilateral adnexa are unremarkable. Other: No abdominal wall hernia or abnormality. No abdominopelvic ascites. Musculoskeletal: No  acute or significant osseous findings. IMPRESSION: Right lower lobe airspace opacity is noted consistent with pneumonia. Small bilateral pleural effusions are noted as well. Ill-defined hyperdense area is noted anteriorly in midpole of the right kidney which may represent focal pyelonephritis, but neoplasm cannot be excluded. Also noted is 11 mm enhancing abnormality in left hepatic lobe which may represent hemangioma, but neoplasm once again cannot be excluded. When the patient is clinically stable and able to follow directions and hold their breath (preferably as an outpatient) further evaluation with dedicated abdominal MRI should be considered. Cholelithiasis. Electronically Signed   By: Marijo Conception M.D.   On: 05/18/2021 15:25   DG CHEST PORT 1 VIEW  Result Date: 05/16/2021 CLINICAL DATA:  Chest pain EXAM: PORTABLE CHEST 1 VIEW COMPARISON:  05/14/2021 FINDINGS: New patchy infiltrates are seen in both lower lung fields, more so on the right side. Costophrenic angles are clear. There is no pneumothorax. IMPRESSION: There are new patchy infiltrates in both lower lung fields, more so on the right side suggesting multifocal pneumonia. Electronically Signed   By: Elmer Picker M.D.   On: 05/16/2021 12:35   DG Chest Port 1 View  Result Date: 05/14/2021 CLINICAL DATA:  Headaches for 2 days. EXAM: PORTABLE CHEST 1 VIEW COMPARISON:  02/23/2021 FINDINGS: The heart size and mediastinal contours are within normal limits. Both lungs are clear. The visualized skeletal structures are unremarkable. IMPRESSION:  No active disease. Electronically Signed   By: Kathreen Devoid M.D.   On: 05/14/2021 14:36   CT RENAL STONE STUDY  Result Date: 05/14/2021 CLINICAL DATA:  Sepsis, urinary retention, indwelling Foley catheter, generalized weakness and chills EXAM: CT ABDOMEN AND PELVIS WITHOUT CONTRAST TECHNIQUE: Multidetector CT imaging of the abdomen and pelvis was performed following the standard protocol without IV  contrast. RADIATION DOSE REDUCTION: This exam was performed according to the departmental dose-optimization program which includes automated exposure control, adjustment of the mA and/or kV according to patient size and/or use of iterative reconstruction technique. COMPARISON:  03/07/2021 FINDINGS: Lower chest: No acute pleural or parenchymal lung disease. Hepatobiliary: Calcified gallstones without evidence of cholecystitis. Unremarkable unenhanced appearance of the liver. Pancreas: Unremarkable unenhanced appearance. Spleen: Unremarkable unenhanced appearance. Adrenals/Urinary Tract: Bladder is decompressed with a Foley catheter. No urinary tract calculi or obstructive uropathy within either kidney. Evaluation of the renal parenchyma is limited without IV contrast. The adrenals are stable. Stomach/Bowel: No bowel obstruction or ileus. Normal appendix right lower quadrant. No bowel wall thickening or inflammatory change. Vascular/Lymphatic: No significant vascular findings are present. No enlarged abdominal or pelvic lymph nodes. Reproductive: Uterus and bilateral adnexa are unremarkable. Other: Trace free fluid in the pelvis may be physiologic. No free intraperitoneal gas. No abdominal wall hernia. Musculoskeletal: No acute or destructive bony lesions. Reconstructed images demonstrate no additional findings. IMPRESSION: 1. Cholelithiasis without cholecystitis. 2. No evidence of urinary tract calculi or obstructive uropathy. Indwelling Foley catheter decompresses the bladder. 3. Trace pelvic free fluid, likely physiologic. Electronically Signed   By: Randa Ngo M.D.   On: [id number]      Delora Fuel, MD [id number] [phone]

## 2021-05-29 NOTE — ED Triage Notes (Addendum)
Pt brought here from home via [facility] with c/o abd pain and headache that started earlier today. Pt has hx of MS- pt has been non-ambulatory for a couple of years per pt. Pt has foley cath in place upon arrival. Pt has had several soft stools today.

## 2021-05-29 NOTE — ED Provider Notes (Signed)
[facility] EMERGENCY DEPARTMENT Provider Note   [name]: [id number] Arrival date & time: 05/29/21  1940     History  Chief Complaint  Patient presents with   Abdominal Pain    headache    Melissa West is a 31 y.o. 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  She describes generalized abdominal cramping like pain, waxing and waning in association with abdominal distention along with nausea but no emesis.  Additionally she reports generalized headache.  She denies fevers or chills, neck pain or stiffness, vision changes, chest pain, and has no shortness of breath.  She denies diarrhea or constipation.  She reports several soft stools prior to arrival today.  She has found no alleviators for her symptoms.  She does have a indwelling Foley catheter which was changed during her recent admission.  The history is provided by the patient.      Home Medications Prior to Admission medications   Medication Sig Start Date End Date Taking? Authorizing Provider  acetaminophen (TYLENOL) 325 MG tablet Take 2 tablets (650 mg total) by mouth every 6 (six) hours as needed for mild pain (or Fever >/= 101). 05/20/21  Yes Emokpae, Courage, MD  albuterol (VENTOLIN HFA) 108 (90 Base) MCG/ACT inhaler Inhale 2 puffs into the lungs every 6 (six) hours as needed for wheezing or shortness of breath (cough). 05/20/21  Yes Emokpae, Courage, MD  ergocalciferol (VITAMIN D2) 1.25 MG (50000 UT) capsule Take 50,000 Units by mouth once a week.   Yes [provider]  pantoprazole (PROTONIX) 40 MG tablet Take 1 tablet (40 mg total) by mouth daily. 05/21/21  Yes Shon Hale, MD  baclofen (LIORESAL) 10 MG tablet Take 1/2  to 1 pill po tid for Muscle spasm Patient not taking: Reported on 05/29/2021 05/20/21   Shon Hale, MD  lactobacillus (FLORANEX/LACTINEX) PACK Take 1 packet (1 g total) by mouth 3 (three) times daily with meals. Patient not taking: Reported on 05/29/2021 05/20/21   Shon Hale, MD  midodrine (PROAMATINE) 10 MG tablet Take 1 tablet (10 mg total) by mouth 3 (three) times daily with meals. 05/20/21   Shon Hale, MD  polyvinyl alcohol (LIQUIFILM TEARS) 1.4 % ophthalmic solution Place 1 drop into both eyes as needed for dry eyes. Patient not taking: Reported on 05/29/2021 05/20/21   Shon Hale, MD  senna-docusate (SENOKOT-S) 8.6-50 MG tablet Take 2 tablets by mouth at bedtime. Patient not taking: Reported on 05/29/2021 05/20/21 05/20/22  Shon Hale, MD      Allergies    Patient has no known allergies.    Review of Systems   Review of Systems  Constitutional:  Negative for chills and fever.  HENT:  Negative for congestion and sore throat.   Eyes: Negative.   Respiratory:  Negative for chest tightness and shortness of breath.   Cardiovascular:  Negative for chest pain.  Gastrointestinal:  Positive for abdominal distention, abdominal pain and nausea. Negative for diarrhea and vomiting.  Genitourinary: Negative.   Musculoskeletal:  Negative for arthralgias, joint swelling and neck pain.  Skin: Negative.  Negative for rash and wound.  Neurological:  Positive for headaches. Negative for dizziness, weakness, light-headedness and numbness.  Psychiatric/Behavioral: Negative.    All other systems reviewed and are negative.  Physical Exam Updated Vital Signs BP 92/79    Pulse 87    Temp 98.5 F (36.9 C) (Oral)    Resp 14    Ht 5\' 3"  (1.6 m)    Wt 69.6 kg    LMP 04/28/2021 (Approximate)    SpO2 99%    BMI 27.18 kg/m  Physical Exam Vitals and nursing note reviewed.  Constitutional:      Appearance: She is well-developed.  HENT:     Head: Normocephalic and atraumatic.  Eyes:      Conjunctiva/sclera: Conjunctivae normal.  Cardiovascular:     Rate and Rhythm: Normal rate and regular rhythm.     Heart sounds: Normal heart sounds.  Pulmonary:     Effort: Pulmonary effort is normal.     Breath sounds: Normal breath sounds. No wheezing.  Abdominal:     General: Bowel sounds are normal. There is distension.     Palpations: Abdomen is soft.     Tenderness: There is generalized abdominal tenderness. There is no guarding.  Musculoskeletal:        General: Normal range of motion.     Cervical back: Normal range of motion.  Skin:    General: Skin is warm and dry.  Neurological:     Mental Status: She is alert.    ED Results / Procedures / Treatments   Labs (all labs ordered are listed, but only abnormal results are displayed) Labs Reviewed  COMPREHENSIVE METABOLIC PANEL - Abnormal; Notable for the following components:      Result Value   Total Bilirubin 1.3 (*)    All other components within normal limits  URINALYSIS, ROUTINE W REFLEX MICROSCOPIC - Abnormal; Notable for the following components:   APPearance TURBID (*)    Protein, ur >=300 (*)    Leukocytes,Ua MODERATE (*)    Bacteria, UA RARE (*)    All other components within normal limits  LIPASE, BLOOD  CBC  POC URINE PREG, ED    EKG None  Radiology No results found.  Procedures Procedures    Medications Ordered in ED Medications  acetaminophen (TYLENOL) tablet 650 mg (650 mg Oral Given 05/29/21 2148)  ondansetron (ZOFRAN) injection 4 mg (4 mg Intravenous Given 05/29/21 2150)  fentaNYL (SUBLIMAZE) injection 50 mcg (50 mcg Intravenous Given 05/29/21 2151)  sodium chloride 0.9 % bolus 1,000 mL (1,000 mLs Intravenous New Bag/Given 05/29/21 2347)    ED Course/ Medical Decision Making/ A&P                           Medical Decision Making Pt with generalized abdominal pain and distention, history and exam raises concerns for possible small bowel obstruction versus intra-abdominal infection.  She  does have a history of recent pyelonephritis and with her recent admission and imaging findings it was recommended that she have a repeat CT to confirm resolution of this pyelonephritis.  This has been ordered here and is pending.   Discussed with Dr. 2348 who assumes care of pt.  Amount and/or Complexity of Data Reviewed Labs: ordered. Radiology: ordered.  Risk OTC drugs. Prescription drug management.           Final Clinical Impression(s) / ED Diagnoses Final diagnoses:  None    Rx / DC Orders ED Discharge Orders     None         Preston Fleeting 05/29/21 2351    05/31/21, MD 05/30/21 0800

## 2021-05-30 DIAGNOSIS — N1330 Unspecified hydronephrosis: Secondary | ICD-10-CM | POA: Diagnosis not present

## 2021-05-30 DIAGNOSIS — N134 Hydroureter: Secondary | ICD-10-CM | POA: Diagnosis not present

## 2021-05-30 MED ORDER — IOHEXOL 300 MG/ML  SOLN
100.0000 mL | Freq: Once | INTRAMUSCULAR | Status: AC | PRN
  Administered 2021-05-30: 100 mL via INTRAVENOUS

## 2021-05-30 NOTE — ED Notes (Signed)
Personal belongings given to EMS on transport; foley catheter emptied prior to d/c.

## 2021-05-30 NOTE — Discharge Instructions (Signed)
Return if you are having any problems. 

## 2021-06-01 NOTE — Telephone Encounter (Signed)
Dr. Epimenio Foot signed the [employer] start form. Faxed to [employer]. Received a receipt of confirmation.  Completed PA for [employer] via CMM. Sent to [facility]. Should have a determination within 1-3 business days. Key: [id number].

## 2021-06-04 ENCOUNTER — Encounter (HOSPITAL_COMMUNITY): Payer: Self-pay

## 2021-06-04 ENCOUNTER — Emergency Department (HOSPITAL_COMMUNITY)
Admission: EM | Admit: 2021-06-04 | Discharge: 2021-06-04 | Disposition: A | Discharge status: Home / Self Care | Payer: Medicare HMO | Attending: Emergency Medicine | Admitting: Emergency Medicine

## 2021-06-04 ENCOUNTER — Other Ambulatory Visit: Payer: Self-pay

## 2021-06-04 DIAGNOSIS — T83098A Other mechanical complication of other indwelling urethral catheter, initial encounter: Secondary | ICD-10-CM | POA: Diagnosis not present

## 2021-06-04 DIAGNOSIS — R519 Headache, unspecified: Secondary | ICD-10-CM | POA: Insufficient documentation

## 2021-06-04 DIAGNOSIS — Z743 Need for continuous supervision: Secondary | ICD-10-CM | POA: Diagnosis not present

## 2021-06-04 DIAGNOSIS — R279 Unspecified lack of coordination: Secondary | ICD-10-CM | POA: Diagnosis not present

## 2021-06-04 DIAGNOSIS — I499 Cardiac arrhythmia, unspecified: Secondary | ICD-10-CM | POA: Diagnosis not present

## 2021-06-04 DIAGNOSIS — R5381 Other malaise: Secondary | ICD-10-CM | POA: Diagnosis not present

## 2021-06-04 DIAGNOSIS — T839XXA Unspecified complication of genitourinary prosthetic device, implant and graft, initial encounter: Secondary | ICD-10-CM

## 2021-06-04 MED ORDER — ACETAMINOPHEN 325 MG PO TABS
650.0000 mg | ORAL_TABLET | Freq: Once | ORAL | Status: AC
  Administered 2021-06-04: 650 mg via ORAL
  Filled 2021-06-04: qty 2

## 2021-06-04 NOTE — Discharge Instructions (Signed)
It was a pleasure taking care of you today!  Your Foley catheter was replaced in the ED.  Maintain your follow-up appointment with the urologist, Dr. Ronne Binning on 06/12/2021.  You may follow-up with your primary care provider as needed.  Return to the ED if you are experiencing increasing/worsening abdominal pain, fever, vomiting, inability to urinate, worsening symptoms.

## 2021-06-04 NOTE — Telephone Encounter (Signed)
[employer] approved coverage of [employer]. "PA Case: [id number], Status: Approved, Coverage Starts on: 04/12/2021 12:00:00 AM, Coverage Ends on: 04/11/2022 12:00:00 AM. Questions? Contact [phone]."

## 2021-06-04 NOTE — ED Notes (Signed)
Unable to flush catheter at this time. Meeting resistance. Sediment noted in the tube. Per [place], [place] switch out the catheter.

## 2021-06-04 NOTE — ED Notes (Signed)
Mom updated at this time.  

## 2021-06-04 NOTE — ED Triage Notes (Addendum)
[facility] via EMS from home due to clogged foley catheter. Patient has home health nurse however they did not have an order to flush catheter. Also has complaints of headache.

## 2021-06-04 NOTE — ED Provider Notes (Signed)
[facility] EMERGENCY DEPARTMENT Provider Note   [name]: [id number] Arrival date & time: 06/04/21  0907     History  Chief Complaint  Patient presents with   Dysuria    Melissa West is a 31 y.o. female who presents to the ED complaining of clogged Foley catheter onset today.  Patient has a home health nurse who attempted to flush the catheter however was unable to.  Patient has not tried medication for symptoms.  Has associated headache.  Denies dysuria, hematuria, abdominal pain, nausea, vomiting, fever. Foley catheter was changed on 05/29/21 during recent ED visit for similar concerns.  Patient has a urology appointment with Dr. Alyson Ingles on 06/12/2021.   The history is provided by the patient. No language interpreter was used.      Home Medications Prior to Admission medications   Medication Sig Start Date End Date Taking? Authorizing Provider  acetaminophen (TYLENOL) 325 MG tablet Take 2 tablets (650 mg total) by mouth every 6 (six) hours as needed for mild pain (or Fever >/= 101). 05/20/21  Yes Emokpae, Courage, MD  albuterol (VENTOLIN HFA) 108 (90 Base) MCG/ACT inhaler Inhale 2 puffs into the lungs every 6 (six) hours as needed for wheezing or shortness of breath (cough). 05/20/21  Yes Emokpae, Courage, MD  ergocalciferol (VITAMIN D2) 1.25 MG (50000 UT) capsule Take 50,000 Units by mouth once a week.   Yes [provider]  midodrine (PROAMATINE) 10 MG tablet Take 1 tablet (10 mg total) by mouth 3 (three) times daily with meals. 05/20/21  Yes Roxan Hockey, MD  baclofen (LIORESAL) 10 MG tablet Take 1/2 to 1 pill po tid for Muscle spasm Patient not taking: Reported on 05/29/2021 05/20/21   Roxan Hockey, MD  lactobacillus (FLORANEX/LACTINEX) PACK Take 1 packet (1 g total) by mouth 3 (three) times daily with meals. Patient not taking: Reported on 05/29/2021 05/20/21   Roxan Hockey, MD  pantoprazole (PROTONIX) 40 MG tablet Take 1 tablet (40 mg total) by mouth daily. Patient  not taking: Reported on 06/04/2021 05/21/21   Roxan Hockey, MD  polyvinyl alcohol (LIQUIFILM TEARS) 1.4 % ophthalmic solution Place 1 drop into both eyes as needed for dry eyes. Patient not taking: Reported on 05/29/2021 05/20/21   Roxan Hockey, MD  senna-docusate (SENOKOT-S) 8.6-50 MG tablet Take 2 tablets by mouth at bedtime. Patient not taking: Reported on 05/29/2021 05/20/21 05/20/22  Roxan Hockey, MD       Allergies    Patient has no known allergies.    Review of Systems   Review of Systems  Constitutional:  Negative for chills and fever.  Gastrointestinal:  Positive for abdominal pain (Improved since last ED visit). Negative for nausea and vomiting.  Genitourinary:  Negative for dysuria and hematuria.  All other systems reviewed and are negative.  Physical Exam Updated Vital Signs BP 108/77    Pulse 100    Temp 98.3 F (36.8 C) (Oral)    Resp 18    Wt 69.4 kg    SpO2 99%    BMI 27.10 kg/m  Physical Exam Vitals and nursing note reviewed.  Constitutional:      General: She is not in acute distress.    Appearance: She is not diaphoretic.  HENT:     Head: Normocephalic and atraumatic.     Mouth/Throat:     Pharynx: No oropharyngeal exudate.  Eyes:     General: No scleral icterus.    Conjunctiva/sclera: Conjunctivae normal.  Cardiovascular:     Rate and Rhythm:  Normal rate and regular rhythm.     Pulses: Normal pulses.     Heart sounds: Normal heart sounds.  Pulmonary:     Effort: Pulmonary effort is normal. No respiratory distress.     Breath sounds: Normal breath sounds. No wheezing.  Abdominal:     General: Bowel sounds are normal.     Palpations: Abdomen is soft. There is no mass.     Tenderness: There is no abdominal tenderness. There is no guarding or rebound.  Musculoskeletal:        General: Normal range of motion.     Cervical back: Normal range of motion and neck supple.  Skin:    General: Skin is warm and dry.  Neurological:     Mental Status: She is  alert.  Psychiatric:        Behavior: Behavior normal.    ED Results / Procedures / Treatments   Labs (all labs ordered are listed, but only abnormal results are displayed) Labs Reviewed - No data to display  EKG None  Radiology No results found.  Procedures Procedures    Medications Ordered in ED Medications  acetaminophen (TYLENOL) tablet 650 mg (650 mg Oral Given 06/04/21 1010)    ED Course/ Medical Decision Making/ A&P Clinical Course as of 06/04/21 1428  Thu Jun 04, 2021  0921 RN notified that they will replace Foley catheter in the ED. [SB]    Clinical Course User Index [SB] Mavis Fichera A, PA-C                           Medical Decision Making Risk OTC drugs.   Patient presents to the ED with concerns for Foley catheter being clogged prior to arrival.  Catheter was initially placed during hospital admission on 05/14/2021 due to sepsis due to UTI.  It was last changed on 05/29/2021.  Patient notes that her symptoms have improved since being in the ED on 05/29/2021.  Patient has a follow-up appointment with urologist, Dr. Alyson Ingles on 06/12/2021.  Vital signs, patient afebrile, not tachycardic or hypoxic.  No acute cardiovascular, respiratory, abdominal exam findings.  No concerns for infection at this time.  Foley catheter replaced by RN in the ED, patient tolerated procedure well, urine noted in Foley bag.  Patient is asymptomatic today without dysuria or hematuria.   Additional history obtained:  External records from outside source obtained and reviewed including: Patient was evaluated in the ED on 05/29/2021  for similar concerns at that time Foley catheter was replaced.  CT abdomen pelvis noted moderate severity bilateral hydronephrosis and hydroureter, right greater than left without evidence of renal calculi.  Markedly distended urinary bladder and cholelithiasis.   Medications:  I ordered medication including Tylenol for headache Reevaluation of the patient after  these medicines showed that the patient improved I have reviewed the patients home medicines and have made adjustments as needed  Reevaluation: After the interventions noted above, I reevaluated the patient and found that they have :improved   Disposition: Patient presentation suspicious for Foley catheter concern due to clogging.  Foley catheter replaced in the ED today.  Patient asymptomatic today without dysuria, hematuria, fever.  Patient's abdominal pain is improving since recent ED visit on 05/29/2021.  After consideration of the diagnostic results and the patients response to treatment, I feel that the patient would benefit from discharge home.  Advised patient to maintain follow-up appointment with urologist on 06/12/2021.  Supportive care measures and  strict return precautions discussed with patient at bedside. Pt acknowledges and verbalizes understanding. Pt appears safe for discharge. Follow up as indicated in discharge paperwork.   This chart was dictated using voice recognition software, Dragon. Despite the best efforts of this provider to proofread and correct errors, errors may still occur which can change documentation meaning.  Final Clinical Impression(s) / ED Diagnoses Final diagnoses:  Foley catheter problem, initial encounter [facility])    Rx / DC Orders ED Discharge Orders     None         Aasha Dina A, PA-C 06/04/21 1428    Milton Ferguson, MD 06/05/21 1614

## 2021-06-08 ENCOUNTER — Ambulatory Visit (HOSPITAL_COMMUNITY)
Admission: RE | Admit: 2021-06-08 | Discharge: 2021-06-08 | Disposition: A | Discharge status: Home / Self Care | Payer: Medicare HMO | Source: Ambulatory Visit | Attending: Neurology | Admitting: Neurology

## 2021-06-08 ENCOUNTER — Other Ambulatory Visit: Payer: Self-pay

## 2021-06-08 DIAGNOSIS — R270 Ataxia, unspecified: Secondary | ICD-10-CM

## 2021-06-08 DIAGNOSIS — G35 Multiple sclerosis: Secondary | ICD-10-CM

## 2021-06-08 DIAGNOSIS — Z7689 Persons encountering health services in other specified circumstances: Secondary | ICD-10-CM | POA: Diagnosis not present

## 2021-06-08 MED ORDER — GADOBUTROL 1 MMOL/ML IV SOLN
7.0000 mL | Freq: Once | INTRAVENOUS | Status: AC | PRN
  Administered 2021-06-08: 7 mL via INTRAVENOUS

## 2021-06-09 DIAGNOSIS — Z7689 Persons encountering health services in other specified circumstances: Secondary | ICD-10-CM | POA: Diagnosis not present

## 2021-06-10 DIAGNOSIS — A419 Sepsis, unspecified organism: Secondary | ICD-10-CM | POA: Diagnosis not present

## 2021-06-10 DIAGNOSIS — E559 Vitamin D deficiency, unspecified: Secondary | ICD-10-CM | POA: Diagnosis not present

## 2021-06-10 DIAGNOSIS — G35 Multiple sclerosis: Secondary | ICD-10-CM | POA: Diagnosis not present

## 2021-06-10 DIAGNOSIS — N390 Urinary tract infection, site not specified: Secondary | ICD-10-CM | POA: Diagnosis not present

## 2021-06-10 DIAGNOSIS — I959 Hypotension, unspecified: Secondary | ICD-10-CM | POA: Diagnosis not present

## 2021-06-10 DIAGNOSIS — R339 Retention of urine, unspecified: Secondary | ICD-10-CM | POA: Diagnosis not present

## 2021-06-10 DIAGNOSIS — R21 Rash and other nonspecific skin eruption: Secondary | ICD-10-CM | POA: Diagnosis not present

## 2021-06-10 DIAGNOSIS — Z466 Encounter for fitting and adjustment of urinary device: Secondary | ICD-10-CM | POA: Diagnosis not present

## 2021-06-10 DIAGNOSIS — N179 Acute kidney failure, unspecified: Secondary | ICD-10-CM | POA: Diagnosis not present

## 2021-06-10 DIAGNOSIS — Z7689 Persons encountering health services in other specified circumstances: Secondary | ICD-10-CM | POA: Diagnosis not present

## 2021-06-10 DIAGNOSIS — R7881 Bacteremia: Secondary | ICD-10-CM | POA: Diagnosis not present

## 2021-06-10 DIAGNOSIS — D649 Anemia, unspecified: Secondary | ICD-10-CM | POA: Diagnosis not present

## 2021-06-10 DIAGNOSIS — Z993 Dependence on wheelchair: Secondary | ICD-10-CM | POA: Diagnosis not present

## 2021-06-11 ENCOUNTER — Telehealth: Payer: Self-pay | Admitting: Neurology

## 2021-06-11 DIAGNOSIS — N390 Urinary tract infection, site not specified: Secondary | ICD-10-CM | POA: Diagnosis not present

## 2021-06-11 DIAGNOSIS — A419 Sepsis, unspecified organism: Secondary | ICD-10-CM | POA: Diagnosis not present

## 2021-06-11 DIAGNOSIS — Z466 Encounter for fitting and adjustment of urinary device: Secondary | ICD-10-CM | POA: Diagnosis not present

## 2021-06-11 DIAGNOSIS — R339 Retention of urine, unspecified: Secondary | ICD-10-CM | POA: Diagnosis not present

## 2021-06-11 DIAGNOSIS — I959 Hypotension, unspecified: Secondary | ICD-10-CM | POA: Diagnosis not present

## 2021-06-11 DIAGNOSIS — G35 Multiple sclerosis: Secondary | ICD-10-CM | POA: Diagnosis not present

## 2021-06-11 DIAGNOSIS — Z993 Dependence on wheelchair: Secondary | ICD-10-CM | POA: Diagnosis not present

## 2021-06-11 DIAGNOSIS — D649 Anemia, unspecified: Secondary | ICD-10-CM | POA: Diagnosis not present

## 2021-06-11 DIAGNOSIS — R7881 Bacteremia: Secondary | ICD-10-CM | POA: Diagnosis not present

## 2021-06-11 DIAGNOSIS — N179 Acute kidney failure, unspecified: Secondary | ICD-10-CM | POA: Diagnosis not present

## 2021-06-11 NOTE — Telephone Encounter (Signed)
Pt's mother, Melissa West (on [place]) discussed at West office visit to send a prescription for muscle spasm. Do not remember the name of the medication. Would like a call from the nurse. ?

## 2021-06-11 NOTE — Telephone Encounter (Signed)
I called pt's mom back ( ok per dpr) and advised the muscle relaxer we have on file is: ?baclofen (LIORESAL) 10 MG tablet         ? Summary: Take 1/2 to 1 pill po tid for Muscle spasm  ?  ?I left this information on vm and advised mom to call back if she had any questions/concerns.  ?

## 2021-06-12 ENCOUNTER — Encounter: Payer: Self-pay | Admitting: Urology

## 2021-06-12 ENCOUNTER — Other Ambulatory Visit: Payer: Self-pay

## 2021-06-12 ENCOUNTER — Ambulatory Visit (INDEPENDENT_AMBULATORY_CARE_PROVIDER_SITE_OTHER): Payer: Medicare Other | Admitting: Urology

## 2021-06-12 VITALS — BP 90/63 | HR 69

## 2021-06-12 DIAGNOSIS — R339 Retention of urine, unspecified: Secondary | ICD-10-CM | POA: Diagnosis not present

## 2021-06-12 NOTE — Patient Instructions (Signed)
Acute Urinary Retention, Female ?Acute urinary retention is a condition in which a person is unable to pass urine or can only pass a little urine. This condition can happen suddenly and last for a short time. If left untreated, it can become long-term (chronic) and result in kidney damage or other serious complications. ?What are the causes? ?This condition may be caused by: ?Obstruction or narrowing of the tube that drains the bladder (urethra). This may be caused by surgery, problems with nearby organs, or injury to the bladder or urethra. ?Problems with the nerves in the bladder. ?Pelvic organ prolapse. ?Tumors in the area of the pelvis, bladder, or urethra. ?Vaginal childbirth. ?Bladder or urinary tract infection. ?Constipation. ?Certain medicines. ?What increases the risk? ?This condition is more likely to develop in women over age 33. Other chronic health conditions can increase the risk of acute urinary retention. These include: ?Diseases such as multiple sclerosis. ?Spinal cord injuries. ?Diabetes. ?Degenerative cognitive conditions, such as delirium or dementia. ?Psychological conditions. A woman may hold her urine due to trauma or because she does not want to use the bathroom. ?History of preexisting urinary retention. ?History of prior pelvic surgery, incontinence surgery, or radical pelvic surgery. ?What are the signs or symptoms? ?Symptoms of this condition include: ?Trouble urinating. ?Pain in the lower abdomen. ?How is this diagnosed? ?This condition is diagnosed based on a physical exam and your medical history. You may also have other tests, including: ?An ultrasound of the bladder or kidneys or both. ?Blood tests. ?A urine analysis. ?Additional tests may be needed, such as a CT scan, MRI, and kidney or bladder function tests. ?How is this treated? ?Treatment for this condition may include: ?Medicines. ?Placing a thin, sterile tube (catheter) into the bladder to drain urine out of the body. This is  called an indwelling urinary catheter. After it is inserted, the catheter is held in place with a small balloon that is filled with sterile water. Urine drains from the catheter into a collection bag outside of the body. ?Behavioral therapy. ?Treatment for other conditions. ?If needed, you may be treated in the hospital for kidney function problems or to manage other complications. ?Follow these instructions at home: ?Medicines ?Take over-the-counter and prescription medicines only as told by your health care provider. Avoid certain medicines, such as decongestants, antihistamines, and some prescription medicines. Do not take any medicine unless your health care provider approves. ?If you were prescribed an antibiotic medicine, take it as told by your health care provider. Do not stop using the antibiotic even if you start to feel better. ?General instructions ?Do not use any products that contain nicotine or tobacco. These products include cigarettes, chewing tobacco, and vaping devices, such as e-cigarettes. If you need help quitting, ask your health care provider. ?Drink enough fluid to keep your urine pale yellow. ?If you have an indwelling urinary catheter, follow the instructions from your health care provider. ?Monitor any changes in your symptoms. Tell your health care provider about any changes. ?If instructed, monitor your blood pressure at home. Report changes as told by your health care provider. ?Keep all follow-up visits. This is important. ?Contact a health care provider if: ?You have uncomfortable bladder contractions that you cannot control (spasms). ?You leak urine with the spasms. ?Get help right away if: ?You have chills or a fever. ?You have blood in your urine. ?You have a catheter and the following happens: ?Your catheter stops draining urine. ?Your catheter falls out. ?Summary ?Acute urinary retention is a condition   in which a person is unable to pass urine or can only pass a little urine. If  left untreated, this can result in kidney damage or other serious complications. ?One cause of this condition may be obstruction or narrowing of the tube that drains the bladder (urethra). This may be caused by surgery, problems with nearby organs, or injury to the bladder or urethra. ?Treatment may include medicines and placement of an indwelling urinary catheter. ?Monitor any changes in your symptoms. Tell your health care provider about any changes. ?This information is not intended to replace advice given to you by your health care provider. Make sure you discuss any questions you have with your health care provider. ?Document Revised: 12/19/2019 Document Reviewed: 12/19/2019 ?Elsevier Patient Education ? 2022 [employer] ? ?

## 2021-06-12 NOTE — Progress Notes (Signed)
? ?06/12/2021 ?11:28 AM  ? ?Melissa West ?Oct 20, 1990 ?[id number] ? ?Referring provider: Benita Stabile, MD ?[address] ?[place] ?[place],  [place] [address] ? ?Followup urinary retention ? ? ?HPI: ?Melissa West is a 30yo here for followup for urinary retention. She underwent UDS 2/28.2023. Bladder capacity 272cc. She was unable to generate a detrusor pressure and urinate even with valsalva. She had multiple unstable detrusor contractions during filling. She has been hospitalized once since last visit for sepsis from a UTI. She does not have good manual dexterity.   ? ? ?PMH: ?Past Medical History:  ?Diagnosis Date  ? Melissa (multiple sclerosis) (HCC)   ? No pertinent past medical history   ? ? ?Surgical History: ?Past Surgical History:  ?Procedure Laterality Date  ? CESAREAN SECTION  08/09/2011  ? Procedure: CESAREAN SECTION;  Surgeon: Melissa Dukes, MD;  Location: [facility];  Service: Gynecology;  Laterality: N/A;  ? ? ?Home Medications:  ?Allergies as of 06/12/2021   ?No Known Allergies ?  ? ?  ?Medication List  ?  ? ?  ? Accurate as of June 12, 2021 11:28 AM. If you have any questions, ask your nurse or doctor.  ?  ?  ? ?  ? ?acetaminophen 325 MG tablet ?Commonly known as: TYLENOL ?Take 2 tablets (650 mg total) by mouth every 6 (six) hours as needed for mild pain (or Fever >/= 101). ?  ?albuterol 108 (90 Base) MCG/ACT inhaler ?Commonly known as: VENTOLIN HFA ?Inhale 2 puffs into the lungs every 6 (six) hours as needed for wheezing or shortness of breath (cough). ?  ?baclofen 10 MG tablet ?Commonly known as: LIORESAL ?Take 1/2 to 1 pill po tid for Muscle spasm ?  ?ergocalciferol 1.25 MG (50000 UT) capsule ?Commonly known as: VITAMIN D2 ?Take 50,000 Units by mouth once a week. ?  ?lactobacillus Pack ?Take 1 packet (1 g total) by mouth 3 (three) times daily with meals. ?  ?midodrine 10 MG tablet ?Commonly known as: PROAMATINE ?Take 1 tablet (10 mg total) by mouth 3 (three) times daily with meals. ?  ?pantoprazole 40 MG  tablet ?Commonly known as: PROTONIX ?Take 1 tablet (40 mg total) by mouth daily. ?  ?polyvinyl alcohol 1.4 % ophthalmic solution ?Commonly known as: LIQUIFILM TEARS ?Place 1 drop into both eyes as needed for dry eyes. ?  ?senna-docusate 8.6-50 MG tablet ?Commonly known as: Senokot-S ?Take 2 tablets by mouth at bedtime. ?  ? ?  ? ? ?Allergies: No Known Allergies ? ?Family History: ?Family History  ?Problem Relation Age of Onset  ? Diabetes Maternal Grandmother   ? Hypertension Maternal Grandmother   ? Kidney disease Maternal Grandmother   ? ? ?Social History:  reports that she has never smoked. She has never used smokeless tobacco. She reports that she does not drink alcohol and does not use drugs. ? ?ROS: ?All other review of systems were reviewed and are negative except what is noted above in HPI ? ?Physical Exam: ?BP 90/63   Pulse 69   ?Constitutional:  Alert and oriented, No acute distress. ?HEENT: [place] AT, moist mucus membranes.  Trachea midline, no masses. ?Cardiovascular: No clubbing, cyanosis, or edema. ?Respiratory: Normal respiratory effort, no increased work of breathing. ?GI: Abdomen is soft, nontender, nondistended, no abdominal masses ?GU: No CVA tenderness.  ?Lymph: No cervical or inguinal lymphadenopathy. ?Skin: No rashes, bruises or suspicious lesions. ?Neurologic: Grossly intact, no focal deficits, moving all 4 extremities. ?Psychiatric: Normal mood and affect. ? ?Laboratory Data: ?Lab  Results  ?Component Value Date  ? WBC 7.7 05/29/2021  ? HGB 12.4 05/29/2021  ? HCT 38.1 05/29/2021  ? MCV 89.2 05/29/2021  ? PLT 375 05/29/2021  ? ? ?Lab Results  ?Component Value Date  ? CREATININE 0.81 05/29/2021  ? ? ?No results found for: PSA ? ?No results found for: TESTOSTERONE ? ?No results found for: HGBA1C ? ?Urinalysis ?   ?Component Value Date/Time  ? COLORURINE YELLOW 05/29/2021 2030  ? APPEARANCEUR TURBID (A) 05/29/2021 2030  ? LABSPEC 1.013 05/29/2021 2030  ? PHURINE 8.0 05/29/2021 2030  ? GLUCOSEU  NEGATIVE 05/29/2021 2030  ? HGBUR NEGATIVE 05/29/2021 2030  ? BILIRUBINUR NEGATIVE 05/29/2021 2030  ? KETONESUR NEGATIVE 05/29/2021 2030  ? PROTEINUR >=300 (A) 05/29/2021 2030  ? UROBILINOGEN 0.2 06/13/2009 1128  ? NITRITE NEGATIVE 05/29/2021 2030  ? LEUKOCYTESUR MODERATE (A) 05/29/2021 2030  ? ? ?Lab Results  ?Component Value Date  ? BACTERIA RARE (A) 05/29/2021  ? ? ?Pertinent Imaging: ?UDS 06/09/2021: Images reviewed and discussed with the patient  ?No results found for this or any previous visit. ? ?No results found for this or any previous visit. ? ?No results found for this or any previous visit. ? ?No results found for this or any previous visit. ? ?No results found for this or any previous visit. ? ?No results found for this or any previous visit. ? ?No results found for this or any previous visit. ? ?Results for orders placed during the hospital encounter of 05/14/21 ? ?CT RENAL STONE STUDY ? ?Narrative ?CLINICAL DATA:  Sepsis, urinary retention, indwelling Foley ?catheter, generalized weakness and chills ? ?EXAM: ?CT ABDOMEN AND PELVIS WITHOUT CONTRAST ? ?TECHNIQUE: ?Multidetector CT imaging of the abdomen and pelvis was performed ?following the standard protocol without IV contrast. ? ?RADIATION DOSE REDUCTION: This exam was performed according to the ?departmental dose-optimization program which includes automated ?exposure control, adjustment of the mA and/or kV according to ?patient size and/or use of iterative reconstruction technique. ? ?COMPARISON:  03/07/2021 ? ?FINDINGS: ?Lower chest: No acute pleural or parenchymal lung disease. ? ?Hepatobiliary: Calcified gallstones without evidence of ?cholecystitis. Unremarkable unenhanced appearance of the liver. ? ?Pancreas: Unremarkable unenhanced appearance. ? ?Spleen: Unremarkable unenhanced appearance. ? ?Adrenals/Urinary Tract: Bladder is decompressed with a Foley ?catheter. No urinary tract calculi or obstructive uropathy within ?either kidney.  Evaluation of the renal parenchyma is limited without ?IV contrast. The adrenals are stable. ? ?Stomach/Bowel: No bowel obstruction or ileus. Normal appendix right ?lower quadrant. No bowel wall thickening or inflammatory change. ? ?Vascular/Lymphatic: No significant vascular findings are present. No ?enlarged abdominal or pelvic lymph nodes. ? ?Reproductive: Uterus and bilateral adnexa are unremarkable. ? ?Other: Trace free fluid in the pelvis may be physiologic. No free ?intraperitoneal gas. No abdominal wall hernia. ? ?Musculoskeletal: No acute or destructive bony lesions. Reconstructed ?images demonstrate no additional findings. ? ?IMPRESSION: ?1. Cholelithiasis without cholecystitis. ?2. No evidence of urinary tract calculi or obstructive uropathy. ?Indwelling Foley catheter decompresses the bladder. ?3. Trace pelvic free fluid, likely physiologic. ? ? ?Electronically Signed ?By: Sharlet Salina M.D. ?On: 05/14/2021 17:14 ? ? ?Assessment & Plan:   ? ?1. Urinary retention ?-We discussed voiding trial, chronic indwelling foley, suprapubic tube, and CIC. After discussing the options the patient is undecided. We will maintain foley catheter for now and she will followup in 1 month for foley change.  ? ?No follow-ups on file. ? ?Wilkie Aye, MD ? ?[facility]  ?  ?

## 2021-06-15 DIAGNOSIS — A419 Sepsis, unspecified organism: Secondary | ICD-10-CM | POA: Diagnosis not present

## 2021-06-15 DIAGNOSIS — N390 Urinary tract infection, site not specified: Secondary | ICD-10-CM | POA: Diagnosis not present

## 2021-06-15 DIAGNOSIS — Z993 Dependence on wheelchair: Secondary | ICD-10-CM | POA: Diagnosis not present

## 2021-06-15 DIAGNOSIS — N179 Acute kidney failure, unspecified: Secondary | ICD-10-CM | POA: Diagnosis not present

## 2021-06-15 DIAGNOSIS — R7881 Bacteremia: Secondary | ICD-10-CM | POA: Diagnosis not present

## 2021-06-15 DIAGNOSIS — D649 Anemia, unspecified: Secondary | ICD-10-CM | POA: Diagnosis not present

## 2021-06-15 DIAGNOSIS — Z466 Encounter for fitting and adjustment of urinary device: Secondary | ICD-10-CM | POA: Diagnosis not present

## 2021-06-15 DIAGNOSIS — G35 Multiple sclerosis: Secondary | ICD-10-CM | POA: Diagnosis not present

## 2021-06-15 DIAGNOSIS — I959 Hypotension, unspecified: Secondary | ICD-10-CM | POA: Diagnosis not present

## 2021-06-15 DIAGNOSIS — R339 Retention of urine, unspecified: Secondary | ICD-10-CM | POA: Diagnosis not present

## 2021-06-15 NOTE — Telephone Encounter (Signed)
Pt ask if can get muscle relaxer to be able to do physical therapy. Would like a call from the nurse. Call patient at [phone] ?

## 2021-06-15 NOTE — Telephone Encounter (Signed)
Called the pt back and advised of the message that was on filed from last week. Explained there was a a medication at [facility] that should be available for her. Advised this was not prescribed by Dr Epimenio Foot, but Dr Epimenio Foot would recommend using that. Pt verbalized understanding. ? ?

## 2021-06-17 ENCOUNTER — Encounter (HOSPITAL_COMMUNITY): Payer: Self-pay

## 2021-06-17 ENCOUNTER — Other Ambulatory Visit: Payer: Self-pay

## 2021-06-17 ENCOUNTER — Inpatient Hospital Stay (HOSPITAL_COMMUNITY)
Admission: EM | Admit: 2021-06-17 | Discharge: 2021-06-20 | DRG: 698 | Disposition: A | Discharge status: Home Health | Payer: Medicare Other | Attending: Family Medicine | Admitting: Family Medicine

## 2021-06-17 ENCOUNTER — Emergency Department (HOSPITAL_COMMUNITY): Payer: Medicare Other

## 2021-06-17 DIAGNOSIS — Z87440 Personal history of urinary (tract) infections: Secondary | ICD-10-CM | POA: Diagnosis not present

## 2021-06-17 DIAGNOSIS — Z8249 Family history of ischemic heart disease and other diseases of the circulatory system: Secondary | ICD-10-CM | POA: Diagnosis not present

## 2021-06-17 DIAGNOSIS — N179 Acute kidney failure, unspecified: Secondary | ICD-10-CM | POA: Diagnosis present

## 2021-06-17 DIAGNOSIS — R000 Tachycardia, unspecified: Secondary | ICD-10-CM | POA: Diagnosis not present

## 2021-06-17 DIAGNOSIS — Z993 Dependence on wheelchair: Secondary | ICD-10-CM

## 2021-06-17 DIAGNOSIS — I959 Hypotension, unspecified: Secondary | ICD-10-CM | POA: Diagnosis not present

## 2021-06-17 DIAGNOSIS — R6520 Severe sepsis without septic shock: Secondary | ICD-10-CM | POA: Diagnosis not present

## 2021-06-17 DIAGNOSIS — D649 Anemia, unspecified: Secondary | ICD-10-CM | POA: Diagnosis not present

## 2021-06-17 DIAGNOSIS — A4150 Gram-negative sepsis, unspecified: Secondary | ICD-10-CM | POA: Diagnosis present

## 2021-06-17 DIAGNOSIS — Z91199 Patient's noncompliance with other medical treatment and regimen due to unspecified reason: Secondary | ICD-10-CM

## 2021-06-17 DIAGNOSIS — A419 Sepsis, unspecified organism: Principal | ICD-10-CM | POA: Diagnosis present

## 2021-06-17 DIAGNOSIS — Z833 Family history of diabetes mellitus: Secondary | ICD-10-CM | POA: Diagnosis not present

## 2021-06-17 DIAGNOSIS — T83518A Infection and inflammatory reaction due to other urinary catheter, initial encounter: Secondary | ICD-10-CM | POA: Diagnosis not present

## 2021-06-17 DIAGNOSIS — R509 Fever, unspecified: Secondary | ICD-10-CM | POA: Diagnosis not present

## 2021-06-17 DIAGNOSIS — R6521 Severe sepsis with septic shock: Secondary | ICD-10-CM | POA: Diagnosis not present

## 2021-06-17 DIAGNOSIS — R339 Retention of urine, unspecified: Secondary | ICD-10-CM | POA: Diagnosis not present

## 2021-06-17 DIAGNOSIS — E876 Hypokalemia: Secondary | ICD-10-CM | POA: Diagnosis not present

## 2021-06-17 DIAGNOSIS — T83511A Infection and inflammatory reaction due to indwelling urethral catheter, initial encounter: Secondary | ICD-10-CM | POA: Diagnosis present

## 2021-06-17 DIAGNOSIS — R059 Cough, unspecified: Secondary | ICD-10-CM | POA: Diagnosis not present

## 2021-06-17 DIAGNOSIS — N136 Pyonephrosis: Secondary | ICD-10-CM | POA: Diagnosis not present

## 2021-06-17 DIAGNOSIS — N390 Urinary tract infection, site not specified: Secondary | ICD-10-CM | POA: Diagnosis not present

## 2021-06-17 DIAGNOSIS — G4489 Other headache syndrome: Secondary | ICD-10-CM | POA: Diagnosis not present

## 2021-06-17 DIAGNOSIS — Z20822 Contact with and (suspected) exposure to covid-19: Secondary | ICD-10-CM | POA: Diagnosis not present

## 2021-06-17 DIAGNOSIS — Z79899 Other long term (current) drug therapy: Secondary | ICD-10-CM

## 2021-06-17 DIAGNOSIS — G35 Multiple sclerosis: Secondary | ICD-10-CM | POA: Diagnosis not present

## 2021-06-17 DIAGNOSIS — R279 Unspecified lack of coordination: Secondary | ICD-10-CM | POA: Diagnosis not present

## 2021-06-17 DIAGNOSIS — B964 Proteus (mirabilis) (morganii) as the cause of diseases classified elsewhere: Secondary | ICD-10-CM | POA: Diagnosis present

## 2021-06-17 DIAGNOSIS — Z7401 Bed confinement status: Secondary | ICD-10-CM | POA: Diagnosis not present

## 2021-06-17 LAB — URINALYSIS, ROUTINE W REFLEX MICROSCOPIC
Bilirubin Urine: NEGATIVE
Glucose, UA: NEGATIVE mg/dL
Ketones, ur: NEGATIVE mg/dL
Nitrite: POSITIVE — AB
Protein, ur: 100 mg/dL — AB
Specific Gravity, Urine: 1.013 (ref 1.005–1.030)
WBC, UA: >50 WBC/hpf — ABNORMAL HIGH (ref 0–5)
pH: 9 — ABNORMAL HIGH (ref 5.0–8.0)

## 2021-06-17 LAB — CBC WITH DIFFERENTIAL/PLATELET
Abs Immature Granulocytes: 0.08 10*3/uL — ABNORMAL HIGH (ref 0.00–0.07)
Basophils Absolute: 0 10*3/uL (ref 0.0–0.1)
Basophils Relative: 0 %
Eosinophils Absolute: 0 10*3/uL (ref 0.0–0.5)
Eosinophils Relative: 0 %
HCT: 38.6 % (ref 36.0–46.0)
Hemoglobin: 12.8 g/dL (ref 12.0–15.0)
Immature Granulocytes: 0 %
Lymphocytes Relative: 2 %
Lymphs Abs: 0.5 10*3/uL — ABNORMAL LOW (ref 0.7–4.0)
MCH: 29.2 pg (ref 26.0–34.0)
MCHC: 33.2 g/dL (ref 30.0–36.0)
MCV: 88.1 fL (ref 80.0–100.0)
Monocytes Absolute: 0.4 10*3/uL (ref 0.1–1.0)
Monocytes Relative: 2 %
Neutro Abs: 17.6 10*3/uL — ABNORMAL HIGH (ref 1.7–7.7)
Neutrophils Relative %: 96 %
Platelets: 203 10*3/uL (ref 150–400)
RBC: 4.38 MIL/uL (ref 3.87–5.11)
RDW: 15.2 % (ref 11.5–15.5)
WBC: 18.6 10*3/uL — ABNORMAL HIGH (ref 4.0–10.5)
nRBC: 0 % (ref 0.0–0.2)

## 2021-06-17 LAB — COMPREHENSIVE METABOLIC PANEL
ALT: 14 U/L (ref 0–44)
AST: 27 U/L (ref 15–41)
Albumin: 3.8 g/dL (ref 3.5–5.0)
Alkaline Phosphatase: 60 U/L (ref 38–126)
Anion gap: 9 (ref 5–15)
BUN: 27 mg/dL — ABNORMAL HIGH (ref 6–20)
CO2: 23 mmol/L (ref 22–32)
Calcium: 9.5 mg/dL (ref 8.9–10.3)
Chloride: 104 mmol/L (ref 98–111)
Creatinine, Ser: 1.39 mg/dL — ABNORMAL HIGH (ref 0.44–1.00)
GFR, Estimated: 52 mL/min — ABNORMAL LOW (ref >60)
Glucose, Bld: 112 mg/dL — ABNORMAL HIGH (ref 70–99)
Potassium: 3.9 mmol/L (ref 3.5–5.1)
Sodium: 136 mmol/L (ref 135–145)
Total Bilirubin: 0.6 mg/dL (ref 0.3–1.2)
Total Protein: 8.3 g/dL — ABNORMAL HIGH (ref 6.5–8.1)

## 2021-06-17 LAB — POC URINE PREG, ED: Preg Test, Ur: NEGATIVE

## 2021-06-17 LAB — LACTIC ACID, PLASMA
Lactic Acid, Venous: 1.2 mmol/L (ref 0.5–1.9)
Lactic Acid, Venous: 3.9 mmol/L (ref 0.5–1.9)
Lactic Acid, Venous: 1.1 mmol/L (ref 0.5–1.9)

## 2021-06-17 LAB — PROTIME-INR
INR: 1.2 (ref 0.8–1.2)
Prothrombin Time: 14.7 seconds (ref 11.4–15.2)

## 2021-06-17 LAB — APTT: aPTT: 26 seconds (ref 24–36)

## 2021-06-17 LAB — RESP PANEL BY RT-PCR (FLU A&B, COVID) ARPGX2
Influenza A by PCR: NEGATIVE
Influenza B by PCR: NEGATIVE
SARS Coronavirus 2 by RT PCR: NEGATIVE

## 2021-06-17 MED ORDER — SODIUM CHLORIDE 0.9 % IV SOLN: INTRAVENOUS | Status: DC

## 2021-06-17 MED ORDER — SODIUM CHLORIDE 0.9 % IV SOLN: 2.0000 g | Freq: Three times a day (TID) | INTRAVENOUS | Status: DC

## 2021-06-17 MED ORDER — NOREPINEPHRINE 4 MG/250ML-% IV SOLN
2.0000 ug/min | INTRAVENOUS | Status: DC
  Administered 2021-06-17: 2 ug/min via INTRAVENOUS
  Filled 2021-06-17 (×3): qty 250

## 2021-06-17 MED ORDER — LACTATED RINGERS IV SOLN: INTRAVENOUS | Status: DC

## 2021-06-17 MED ORDER — BISACODYL 10 MG RE SUPP: 10.0000 mg | Freq: Every day | RECTAL | Status: DC | PRN

## 2021-06-17 MED ORDER — SODIUM CHLORIDE 0.9 % IV SOLN
2.0000 g | Freq: Once | INTRAVENOUS | Status: AC
  Administered 2021-06-17: 2 g via INTRAVENOUS
  Filled 2021-06-17: qty 2

## 2021-06-17 MED ORDER — MORPHINE SULFATE (PF) 2 MG/ML IV SOLN
2.0000 mg | Freq: Once | INTRAVENOUS | Status: AC
  Administered 2021-06-17: 2 mg via INTRAVENOUS
  Filled 2021-06-17: qty 1

## 2021-06-17 MED ORDER — SODIUM CHLORIDE 0.9 % IV SOLN
250.0000 mL | INTRAVENOUS | Status: DC
  Administered 2021-06-17: 250 mL via INTRAVENOUS

## 2021-06-17 MED ORDER — LACTATED RINGERS IV BOLUS (SEPSIS)
1000.0000 mL | Freq: Once | INTRAVENOUS | Status: AC
  Administered 2021-06-17: 1000 mL via INTRAVENOUS

## 2021-06-17 MED ORDER — PIPERACILLIN-TAZOBACTAM 3.375 G IVPB
3.3750 g | Freq: Three times a day (TID) | INTRAVENOUS | Status: DC
  Administered 2021-06-17 – 2021-06-20 (×9): 3.375 g via INTRAVENOUS
  Filled 2021-06-17 (×9): qty 50

## 2021-06-17 MED ORDER — MIDODRINE HCL 5 MG PO TABS
10.0000 mg | ORAL_TABLET | Freq: Three times a day (TID) | ORAL | Status: DC
  Administered 2021-06-18 – 2021-06-20 (×7): 10 mg via ORAL
  Filled 2021-06-17 (×7): qty 2

## 2021-06-17 MED ORDER — SODIUM CHLORIDE 0.9% FLUSH: 3.0000 mL | INTRAVENOUS | Status: DC | PRN

## 2021-06-17 MED ORDER — PANTOPRAZOLE SODIUM 40 MG PO TBEC
40.0000 mg | DELAYED_RELEASE_TABLET | Freq: Every day | ORAL | Status: DC
  Administered 2021-06-18 – 2021-06-20 (×3): 40 mg via ORAL
  Filled 2021-06-17 (×3): qty 1

## 2021-06-17 MED ORDER — TRAZODONE HCL 50 MG PO TABS: 50.0000 mg | ORAL_TABLET | Freq: Every evening | ORAL | Status: DC | PRN

## 2021-06-17 MED ORDER — ACETAMINOPHEN 325 MG PO TABS
650.0000 mg | ORAL_TABLET | Freq: Four times a day (QID) | ORAL | Status: DC | PRN
  Administered 2021-06-17 – 2021-06-20 (×3): 650 mg via ORAL
  Filled 2021-06-17 (×4): qty 2

## 2021-06-17 MED ORDER — FLORANEX PO PACK
1.0000 g | PACK | Freq: Three times a day (TID) | ORAL | Status: DC
  Administered 2021-06-18 – 2021-06-20 (×6): 1 g via ORAL
  Filled 2021-06-17 (×14): qty 1

## 2021-06-17 MED ORDER — SODIUM CHLORIDE 0.9% FLUSH
3.0000 mL | Freq: Two times a day (BID) | INTRAVENOUS | Status: DC
  Administered 2021-06-17 – 2021-06-20 (×6): 3 mL via INTRAVENOUS

## 2021-06-17 MED ORDER — SODIUM CHLORIDE 0.9 % IV BOLUS
1000.0000 mL | Freq: Once | INTRAVENOUS | Status: AC
  Administered 2021-06-17: 1000 mL via INTRAVENOUS

## 2021-06-17 MED ORDER — ONDANSETRON HCL 4 MG/2ML IJ SOLN: 4.0000 mg | Freq: Four times a day (QID) | INTRAMUSCULAR | Status: DC | PRN

## 2021-06-17 MED ORDER — SODIUM CHLORIDE 0.9 % IV SOLN: 250.0000 mL | INTRAVENOUS | Status: DC | PRN

## 2021-06-17 MED ORDER — ACETAMINOPHEN 650 MG RE SUPP: 650.0000 mg | Freq: Four times a day (QID) | RECTAL | Status: DC | PRN

## 2021-06-17 MED ORDER — ACETAMINOPHEN 500 MG PO TABS
1000.0000 mg | ORAL_TABLET | Freq: Once | ORAL | Status: AC
  Administered 2021-06-17: 1000 mg via ORAL
  Filled 2021-06-17: qty 2

## 2021-06-17 MED ORDER — CHLORHEXIDINE GLUCONATE CLOTH 2 % EX PADS
6.0000 | MEDICATED_PAD | Freq: Every day | CUTANEOUS | Status: DC
  Administered 2021-06-18 – 2021-06-20 (×3): 6 via TOPICAL

## 2021-06-17 MED ORDER — SODIUM CHLORIDE 0.9% FLUSH
3.0000 mL | Freq: Two times a day (BID) | INTRAVENOUS | Status: DC
  Administered 2021-06-17 – 2021-06-19 (×4): 3 mL via INTRAVENOUS

## 2021-06-17 MED ORDER — POLYETHYLENE GLYCOL 3350 17 G PO PACK: 17.0000 g | PACK | Freq: Every day | ORAL | Status: DC | PRN

## 2021-06-17 MED ORDER — HEPARIN SODIUM (PORCINE) 5000 UNIT/ML IJ SOLN
5000.0000 [IU] | Freq: Three times a day (TID) | INTRAMUSCULAR | Status: DC
  Administered 2021-06-17 – 2021-06-20 (×8): 5000 [IU] via SUBCUTANEOUS
  Filled 2021-06-17 (×8): qty 1

## 2021-06-17 MED ORDER — ONDANSETRON HCL 4 MG PO TABS: 4.0000 mg | ORAL_TABLET | Freq: Four times a day (QID) | ORAL | Status: DC | PRN

## 2021-06-17 MED ORDER — LACTATED RINGERS IV BOLUS (SEPSIS)
250.0000 mL | Freq: Once | INTRAVENOUS | Status: AC
  Administered 2021-06-17: 250 mL via INTRAVENOUS

## 2021-06-17 MED ORDER — SODIUM CHLORIDE 0.9 % IV SOLN: 2.0000 g | Freq: Two times a day (BID) | INTRAVENOUS | Status: DC

## 2021-06-17 NOTE — ED Notes (Signed)
Pt verbalized MSE approval.  ?

## 2021-06-17 NOTE — ED Notes (Signed)
Dr aware of bp and lactic acid levels.  ?

## 2021-06-17 NOTE — ED Notes (Signed)
Date and time results received: 06/17/21 3:34 PM ? ? ?Test: Lactic Acid ?Critical Value: 3.9 ? ?Name of Provider Notified: Dr Marisa Severin ?Orders Received? Or Actions Taken?: See orders ?

## 2021-06-17 NOTE — Progress Notes (Addendum)
Pharmacy Antibiotic Note ? ?Melissa West is a 31 y.o. female admitted on 06/17/2021 with UTI.  Pharmacy has been consulted for Cefepime dosing. ? ?Plan: ?Zosyn 3.375g IV every 8 hours. ?Monitor labs, c/s, and patient improvement. ? ?Height: 5\' 3"  (160 cm) ?Weight: 69.4 kg (153 lb) ?IBW/kg (Calculated) : 52.4 ? ?Temp (24hrs), Avg:101 ?F (38.3 ?C), Min:101 ?F (38.3 ?C), Max:101 ?F (38.3 ?C) ? ?No results for input(s): WBC, CREATININE, LATICACIDVEN, VANCOTROUGH, VANCOPEAK, VANCORANDOM, GENTTROUGH, GENTPEAK, GENTRANDOM, TOBRATROUGH, TOBRAPEAK, TOBRARND, AMIKACINPEAK, AMIKACINTROU, AMIKACIN in the last 168 hours.  ?Estimated Creatinine Clearance: 93.2 mL/min (by C-G formula based on SCr of 0.81 mg/dL).   ? ?No Known Allergies ? ?Antimicrobials this admission: ?Zosyn 3/8 >> ?Cefepime 3/8  ? ?Microbiology results: ?3/8 BCx: pending ?3/8 Ucx: pending ?2/2 Ucx: morganella and proteus ?2/2 Bcx: e. coli ? ? ?Thank you for allowing pharmacy to be a part of this patient?s care. ? ?5/8, PharmD ?Clinical Pharmacist ?06/17/2021 10:41 AM ? ? ?

## 2021-06-17 NOTE — ED Triage Notes (Signed)
Pateint via EMS from home due to clogged foley catheter. Patient has home health nurse however they did not have an order to flush catheter. Also has complaints of headache.  °

## 2021-06-17 NOTE — Sepsis Progress Note (Signed)
Code sepsis protocol being followed by [facility] 

## 2021-06-17 NOTE — Sepsis Progress Note (Signed)
Initial lactic acid =1.2. Appropriate fluid resuscitation based on 69.4kg weight.and maxipime was given. Repeat lactic acid =3.9. Additional 1L NS  bolus and zosyn orders added. Will need follow up lactic acid order. ?

## 2021-06-17 NOTE — H&P (Signed)
Patient Demographics:    Melissa West, is a 31 y.o. female  MRN: [id number]   DOB - 1990-10-26  Admit Date - 06/17/2021  Outpatient Primary MD for the patient is Benita Stabile, MD   Assessment & Plan:   Assessment and Plan:  1)Severe Sepsis  with Septic shock due to presumed UTI in a patient with chronic indwelling Foley catheter in the setting of multiple sclerosis induced urinary retention--- -Recent urine cultures from 05/14/2021 had Morganella morganii and Proteus -Recent blood cultures from 05/14/2021 grew E. coli and Enterobacter -Patient received IV fluid boluses -Lactic acid improved from 3.9 to 1.1 -WBC is 18.6 --Iv Zosyn pending recent sensitivity report pending repeat urine and blood cultures from 06/17/2021 -Patient has a tendency towards chronic hypotension and supposed to be on midodrine but was not compliant with midodrine PTA -Despite IV fluids in the ED hypotension persisted patient was started on IV Levophed for pressor support -Patient meets sepsis criteria on admission---In the ED patient is found to have a fever of 101, heart rate in the 140s respiratory rate 24 blood pressure 70/48  2) multiple sclerosis with chronic indwelling Foley catheter --With recurrent UTIs -At baseline patient is wheelchair-bound   3)AKI--Creatinine 1.39 baseline between 0.6-0.8 - renally adjust medications, avoid nephrotoxic agents / dehydration  / hypotension  Disposition/Need for in-Hospital Stay- patient unable to be discharged at this time due to --sepsis with septic shock requiring IV fluids and IV antibiotics*  Status is: Inpatient  Remains inpatient appropriate because:   Dispo: The patient is from: Home              Anticipated d/c is to: Home              Anticipated d/c date is: > 3 days               Patient currently is not medically stable to d/c. Barriers: Not Clinically Stable-   With History of - Reviewed by me  Past Medical History:  Diagnosis Date   MS (multiple sclerosis) (HCC)    No pertinent past medical history       Past Surgical History:  Procedure Laterality Date   CESAREAN SECTION  08/09/2011   Procedure: CESAREAN SECTION;  Surgeon: Lesly Dukes, MD;  Location: [facility];  Service: Gynecology;  Laterality: N/A;    Chief Complaint  Patient presents with   Urinary Retention      HPI:    Melissa West  is a 31 y.o. female with past medical history relevant for multiple sclerosis, wheelchair-bound at baseline, with chronic urinary retention requiring Foley catheter with recurrent UTIs presents to the ED with concerns about headaches malaise and clogged Foley catheter - In the ED she is found to be hypotensive with EKG showing sinus tachycardia-- -Despite IV fluid boluses hypotension persisted patient was started on IV Levophed for pressure support, please note the patient does have tendency towards hypotension and  is supposed to be on midodrine at home but she was noncompliant with midodrine prior to admission -In the ED patient is found to have a fever of 101, heart rate in the 140s respiratory rate 24 blood pressure 70/48 -UA suggestive of UTI -Chest x-ray without acute cardiopulmonary findings -WBC 18 K -Creatinine is up to 1.39 -Lactic acid improved to 1.1 from 3.9 after IV fluids   Review of systems:    In addition to the HPI above,   A full Review of  Systems was done, all other systems reviewed are negative except as noted above in HPI , .    Social History:  Reviewed by me    Social History   Tobacco Use   Smoking status: Never   Smokeless tobacco: Never  Substance Use Topics   Alcohol use: No       Family History :  Reviewed by me    Family History  Problem Relation Age of Onset   Diabetes Maternal Grandmother     Hypertension Maternal Grandmother    Kidney disease Maternal Grandmother      Home Medications:   Prior to Admission medications   Medication Sig Start Date End Date Taking? Authorizing Provider  acetaminophen (TYLENOL) 325 MG tablet Take 2 tablets (650 mg total) by mouth every 6 (six) hours as needed for mild pain (or Fever >/= 101). 05/20/21  Yes Uchechi Denison, MD  albuterol (VENTOLIN HFA) 108 (90 Base) MCG/ACT inhaler Inhale 2 puffs into the lungs every 6 (six) hours as needed for wheezing or shortness of breath (cough). Patient not taking: Reported on 06/12/2021 05/20/21   Shon Hale, MD  baclofen (LIORESAL) 10 MG tablet Take 1/2 to 1 pill po tid for Muscle spasm Patient not taking: Reported on 05/29/2021 05/20/21   Shon Hale, MD  ergocalciferol (VITAMIN D2) 1.25 MG (50000 UT) capsule Take 50,000 Units by mouth once a week. Patient not taking: Reported on 06/12/2021    [provider]  lactobacillus (FLORANEX/LACTINEX) PACK Take 1 packet (1 g total) by mouth 3 (three) times daily with meals. Patient not taking: Reported on 05/29/2021 05/20/21   Shon Hale, MD  midodrine (PROAMATINE) 10 MG tablet Take 1 tablet (10 mg total) by mouth 3 (three) times daily with meals. Patient not taking: Reported on 06/12/2021 05/20/21   Shon Hale, MD  pantoprazole (PROTONIX) 40 MG tablet Take 1 tablet (40 mg total) by mouth daily. Patient not taking: Reported on 06/04/2021 05/21/21   Shon Hale, MD  polyvinyl alcohol (LIQUIFILM TEARS) 1.4 % ophthalmic solution Place 1 drop into both eyes as needed for dry eyes. Patient not taking: Reported on 05/29/2021 05/20/21   Shon Hale, MD  senna-docusate (SENOKOT-S) 8.6-50 MG tablet Take 2 tablets by mouth at bedtime. Patient not taking: Reported on 05/29/2021 05/20/21 05/20/22  Shon Hale, MD     Allergies:    No Known Allergies   Physical Exam:   Vitals  Blood pressure (!) 92/58, pulse (!) 119, temperature 98.7 F (37.1 C),  temperature source Oral, resp. rate 17, height 5\' 3"  (1.6 m), weight 65.8 kg, SpO2 98 %.  Physical Examination: General appearance - alert,  and in no distress  Mental status - alert, oriented to person, place, and time,  Eyes - sclera anicteric Neck - supple, no JVD elevation , Chest - clear  to auscultation bilaterally, symmetrical air movement,  Heart - S1 and S2 normal, regular , tachycardic Abdomen - soft, nontender, nondistended, no masses or organomegaly Neurological -  chronic neuromuscular deficits related to patient's underlying multiple sclerosis without additional new acute deficits Extremities - no pedal edema noted, intact peripheral pulses  Skin - warm, dry GU--foley catheter in situ     Data Review:    CBC Recent Labs  Lab 06/17/21 1050  WBC 18.6*  HGB 12.8  HCT 38.6  PLT 203  MCV 88.1  MCH 29.2  MCHC 33.2  RDW 15.2  LYMPHSABS 0.5*  MONOABS 0.4  EOSABS 0.0  BASOSABS 0.0   ------------------------------------------------------------------------------------------------------------------  Chemistries  Recent Labs  Lab 06/17/21 1050  NA 136  K 3.9  CL 104  CO2 23  GLUCOSE 112*  BUN 27*  CREATININE 1.39*  CALCIUM 9.5  AST 27  ALT 14  ALKPHOS 60  BILITOT 0.6   ------------------------------------------------------------------------------------------------------------------ estimated creatinine clearance is 53 mL/min (A) (by C-G formula based on SCr of 1.39 mg/dL (H)). ------------------------------------------------------------------------------------------------------------------ No results for input(s): TSH, T4TOTAL, T3FREE, THYROIDAB in the last 72 hours.  Invalid input(s): FREET3   Coagulation profile Recent Labs  Lab 06/17/21 1050  INR 1.2   ------------------------------------------------------------------------------------------------------------------- No results for input(s): DDIMER in the last 72  hours. -------------------------------------------------------------------------------------------------------------------  Cardiac Enzymes No results for input(s): CKMB, TROPONINI, MYOGLOBIN in the last 168 hours.  Invalid input(s): CK ------------------------------------------------------------------------------------------------------------------    Component Value Date/Time   BNP 297.0 (H) 05/16/2021 0420     ---------------------------------------------------------------------------------------------------------------  Urinalysis    Component Value Date/Time   COLORURINE YELLOW 06/17/2021 1303   APPEARANCEUR TURBID (A) 06/17/2021 1303   LABSPEC 1.013 06/17/2021 1303   PHURINE 9.0 (H) 06/17/2021 1303   GLUCOSEU NEGATIVE 06/17/2021 1303   HGBUR SMALL (A) 06/17/2021 1303   BILIRUBINUR NEGATIVE 06/17/2021 1303   KETONESUR NEGATIVE 06/17/2021 1303   PROTEINUR 100 (A) 06/17/2021 1303   UROBILINOGEN 0.2 06/13/2009 1128   NITRITE POSITIVE (A) 06/17/2021 1303   LEUKOCYTESUR MODERATE (A) 06/17/2021 1303    ----------------------------------------------------------------------------------------------------------------   Imaging Results:    DG Chest Port 1 View  Result Date: 06/17/2021 CLINICAL DATA:  Fever and cough EXAM: PORTABLE CHEST 1 VIEW COMPARISON:  05/16/2021 FINDINGS: Artifact from EKG leads. Normal heart size and mediastinal contours. No acute infiltrate or edema. No effusion or pneumothorax. No acute osseous findings. IMPRESSION: No active disease. Electronically Signed   By: Tiburcio Pea M.D.   On: 06/17/2021 11:31    Radiological Exams on Admission: DG Chest Port 1 View  Result Date: 06/17/2021 CLINICAL DATA:  Fever and cough EXAM: PORTABLE CHEST 1 VIEW COMPARISON:  05/16/2021 FINDINGS: Artifact from EKG leads. Normal heart size and mediastinal contours. No acute infiltrate or edema. No effusion or pneumothorax. No acute osseous findings. IMPRESSION: No active  disease. Electronically Signed   By: Tiburcio Pea M.D.   On: 06/17/2021 11:31    DVT Prophylaxis -SCD/heparin AM Labs Ordered, also please review Full Orders  Family Communication: Admission, patients condition and plan of care including tests being ordered have been discussed with the patient who indicate understanding and agree with the plan   Code Status - Full Code  Likely DC to home with home health  Condition   -fair  Shon Hale M.D on 06/17/2021 at 6:47 PM Go to [email or web address] -  for contact info  [facility] - Office  [phone]

## 2021-06-17 NOTE — ED Provider Notes (Signed)
[facility] EMERGENCY DEPARTMENT Provider Note   [name]: [id number] Arrival date & time: 06/17/21  1093     History  Chief Complaint  Patient presents with   Urinary Retention    Melissa West is a 31 y.o. female with a past medical history of MS, chronic indwelling catheter, and sepsis secondary to UTI presenting today with complaint of a headache.  Additionally, her home health nurse called EMS due to inability to flush her Foley catheter.  Patient denies any abdominal pain, dysuria or hematuria.  Denies any fevers or chills, just reports she has a 10 out of 10 headache.  No visual disturbances, nausea or vomiting.  Patient is nonambulatory secondary to her MS.  She reports that she lives at home by herself however is wheelchair-bound.  Home health nurse comes every day and her mother also visits and assists her.   Home Medications Prior to Admission medications   Medication Sig Start Date End Date Taking? Authorizing Provider  acetaminophen (TYLENOL) 325 MG tablet Take 2 tablets (650 mg total) by mouth every 6 (six) hours as needed for mild pain (or Fever >/= 101). Patient not taking: Reported on 06/12/2021 05/20/21   Shon Hale, MD  albuterol (VENTOLIN HFA) 108 (90 Base) MCG/ACT inhaler Inhale 2 puffs into the lungs every 6 (six) hours as needed for wheezing or shortness of breath (cough). Patient not taking: Reported on 06/12/2021 05/20/21   Shon Hale, MD  baclofen (LIORESAL) 10 MG tablet Take 1/2 to 1 pill po tid for Muscle spasm Patient not taking: Reported on 05/29/2021 05/20/21   Shon Hale, MD  ergocalciferol (VITAMIN D2) 1.25 MG (50000 UT) capsule Take 50,000 Units by mouth once a week. Patient not taking: Reported on 06/12/2021    [provider]  lactobacillus (FLORANEX/LACTINEX) PACK Take 1 packet (1 g total) by mouth 3 (three) times daily with meals. Patient not taking: Reported on 05/29/2021 05/20/21   Shon Hale, MD  midodrine (PROAMATINE) 10 MG  tablet Take 1 tablet (10 mg total) by mouth 3 (three) times daily with meals. Patient not taking: Reported on 06/12/2021 05/20/21   Shon Hale, MD  pantoprazole (PROTONIX) 40 MG tablet Take 1 tablet (40 mg total) by mouth daily. Patient not taking: Reported on 06/04/2021 05/21/21   Shon Hale, MD  polyvinyl alcohol (LIQUIFILM TEARS) 1.4 % ophthalmic solution Place 1 drop into both eyes as needed for dry eyes. Patient not taking: Reported on 05/29/2021 05/20/21   Shon Hale, MD  senna-docusate (SENOKOT-S) 8.6-50 MG tablet Take 2 tablets by mouth at bedtime. Patient not taking: Reported on 05/29/2021 05/20/21 05/20/22  Shon Hale, MD      Allergies    Patient has no known allergies.    Review of Systems   Review of Systems See HPI  Physical Exam Updated Vital Signs BP 106/73 (BP Location: Left Arm)    Pulse (!) 111    Temp (!) 101 F (38.3 C) (Oral)    Resp 18    Ht 5\' 3"  (1.6 m)    Wt 69.4 kg    SpO2 100%    BMI 27.10 kg/m  Physical Exam Vitals and nursing note reviewed.  Constitutional:      Appearance: Normal appearance. She is ill-appearing (Chronically ill-appearing). She is not diaphoretic.  HENT:     Head: Normocephalic and atraumatic.  Eyes:     General: No scleral icterus.    Conjunctiva/sclera: Conjunctivae normal.  Cardiovascular:     Rate and Rhythm: Regular rhythm.  Tachycardia present.  Pulmonary:     Effort: Pulmonary effort is normal. No respiratory distress.  Abdominal:     General: Abdomen is flat.     Palpations: Abdomen is soft.     Tenderness: There is no abdominal tenderness.  Genitourinary:    Comments: Patient noted to be on menstrual period.  Catheter appears to be in place Skin:    General: Skin is warm and dry.     Findings: No rash.  Neurological:     Mental Status: She is alert.  Psychiatric:        Mood and Affect: Mood normal.    ED Results / Procedures / Treatments   Labs (all labs ordered are listed, but only abnormal results  are displayed) Labs Reviewed  COMPREHENSIVE METABOLIC PANEL - Abnormal; Notable for the following components:      Result Value   Glucose, Bld 112 (*)    BUN 27 (*)    Creatinine, Ser 1.39 (*)    Total Protein 8.3 (*)    GFR, Estimated 52 (*)    All other components within normal limits  CBC WITH DIFFERENTIAL/PLATELET - Abnormal; Notable for the following components:   WBC 18.6 (*)    Neutro Abs 17.6 (*)    Lymphs Abs 0.5 (*)    Abs Immature Granulocytes 0.08 (*)    All other components within normal limits  URINALYSIS, ROUTINE W REFLEX MICROSCOPIC - Abnormal; Notable for the following components:   APPearance TURBID (*)    pH 9.0 (*)    Hgb urine dipstick SMALL (*)    Protein, ur 100 (*)    Nitrite POSITIVE (*)    Leukocytes,Ua MODERATE (*)    WBC, UA >50 (*)    Bacteria, UA FEW (*)    All other components within normal limits  RESP PANEL BY RT-PCR (FLU A&B, COVID) ARPGX2  CULTURE, BLOOD (ROUTINE X 2)  CULTURE, BLOOD (ROUTINE X 2)  URINE CULTURE  LACTIC ACID, PLASMA  PROTIME-INR  APTT  LACTIC ACID, PLASMA  POC URINE PREG, ED    EKG None  Radiology No results found.  Procedures .Critical Care Performed by: Saddie Benders, PA-C Authorized by: Saddie Benders, PA-C   Critical care provider statement:    Critical care time (minutes):  30   Critical care time was exclusive of:  Separately billable procedures and treating other patients   Critical care was necessary to treat or prevent imminent or life-threatening deterioration of the following conditions:  Sepsis   Critical care was time spent personally by me on the following activities:  Development of treatment plan with patient or surrogate, evaluation of patient's response to treatment, examination of patient, ordering and review of laboratory studies, ordering and review of radiographic studies, ordering and performing treatments and interventions, pulse oximetry, re-evaluation of patient's condition and  review of old charts   I assumed direction of critical care for this patient from another provider in my specialty: no     Care discussed with: admitting provider     Medications Ordered in ED Medications  lactated ringers infusion (has no administration in time range)  ceFEPIme (MAXIPIME) 2 g in sodium chloride 0.9 % 100 mL IVPB (has no administration in time range)  lactated ringers bolus 1,000 mL (has no administration in time range)    And  lactated ringers bolus 1,000 mL (has no administration in time range)    And  lactated ringers bolus 250 mL (has no administration in  time range)  ceFEPIme (MAXIPIME) 2 g in sodium chloride 0.9 % 100 mL IVPB (has no administration in time range)    ED Course/ Medical Decision Making/ A&P                           Medical Decision Making Amount and/or Complexity of Data Reviewed Labs: ordered. Radiology: ordered. ECG/medicine tests: ordered.  Risk OTC drugs. Prescription drug management. Decision regarding hospitalization.    Co morbidities that complicate the patient evaluation include: MS, wheelchair dependency and current foley-catheter placement.  Per internal/external chart review: patient has been seen multiple times with dysfunction of her Foley catheter.  Additionally, on 05/14/2021 patient was admitted to the hospital with sepsis secondary to a urinary tract infection.  That is when her Foley catheter was placed.  It was exchanged on 2/17 as well as 05/2021.  On 3/3 patient had a appointment with urology.  She was offered suprapubic catheter, CIC or continued chronic indwelling catheter.  It appears that she wanted more time to think so the plan was to exchange her Foley 1 month from her visit.   Additionally, throughout all of her February visits, patient complained of a headache however no abdominal pain or urinary symptoms.  Negative CT on 05/14/2021.   I performed a full physical exam, pertinent findings include: -Foley catheter  in place -Tachycardia   Diagnostics:  I ordered and viewed labs. The pertinent results include:  Leukocytosis 18.6 Creatinine 1.39, GFR 52, up from 0.81 and GFR >60 2 weeks ago Normal lactic acid Nitrite and leuk positive bacteriuria   I ordered and individually viewed patient's chest x-ray.  There are no signs of infection responsible for sepsis.  Radiologist with no acute cardiopulmonary abnormalities.   Cardiac Monitoring:  The patient was maintained on a cardiac monitor.  I personally viewed and interpreted the cardiac monitored which showed normal sinus rhythm with a normal rate.  Critical Interventions: Prompt initiation of sepsis work-up to include antibiotics for a presumed urinary source.   Treatment:  I ordered Tylenol for patient's headache and fever.   -cefepime for patient's presumed urinary tract infection -fluids for sepsis.  Dispostion:  I reevaluated the patient and found that she continues to have a headache.  Heart rate is down to 99 however still bounces to 110.  Temperature 98.5, blood pressures soft at 90s/60s.  We will refrain from further opioid treatment for her pain.  I believe patient would benefit from admission to the hospital for sepsis secondary to urinary infection.  She and her mother are understanding and agreeable to this.  Accepting physician, Dr. Mariea Clonts.   Final Clinical Impression(s) / ED Diagnoses Final diagnoses:  Sepsis secondary to UTI [facility])  AKI (acute kidney injury) [facility])    Rx / DC Orders Admit to hospitalist     Saddie Benders, PA-C 06/17/21 1445    Jacalyn Lefevre, MD 06/17/21 [phone]

## 2021-06-17 NOTE — ED Notes (Signed)
Bladder scan >999 ml.

## 2021-06-18 DIAGNOSIS — G35 Multiple sclerosis: Secondary | ICD-10-CM

## 2021-06-18 LAB — BLOOD CULTURE ID PANEL (REFLEXED) - BCID2

## 2021-06-18 LAB — BASIC METABOLIC PANEL
Anion gap: 7 (ref 5–15)
BUN: 14 mg/dL (ref 6–20)
CO2: 22 mmol/L (ref 22–32)
Calcium: 8 mg/dL — ABNORMAL LOW (ref 8.9–10.3)
Chloride: 111 mmol/L (ref 98–111)
Creatinine, Ser: 0.81 mg/dL (ref 0.44–1.00)
GFR, Estimated: >60 mL/min (ref >60)
Glucose, Bld: 110 mg/dL — ABNORMAL HIGH (ref 70–99)
Potassium: 3.1 mmol/L — ABNORMAL LOW (ref 3.5–5.1)
Sodium: 140 mmol/L (ref 135–145)

## 2021-06-18 LAB — CBC
HCT: 31.4 % — ABNORMAL LOW (ref 36.0–46.0)
Hemoglobin: 10.1 g/dL — ABNORMAL LOW (ref 12.0–15.0)
MCH: 28.5 pg (ref 26.0–34.0)
MCHC: 32.2 g/dL (ref 30.0–36.0)
MCV: 88.7 fL (ref 80.0–100.0)
Platelets: 153 10*3/uL (ref 150–400)
RBC: 3.54 MIL/uL — ABNORMAL LOW (ref 3.87–5.11)
RDW: 15.2 % (ref 11.5–15.5)
WBC: 17.2 10*3/uL — ABNORMAL HIGH (ref 4.0–10.5)
nRBC: 0 % (ref 0.0–0.2)

## 2021-06-18 LAB — MRSA NEXT GEN BY PCR, NASAL: MRSA by PCR Next Gen: NOT DETECTED

## 2021-06-18 MED ORDER — POTASSIUM CHLORIDE CRYS ER 20 MEQ PO TBCR
40.0000 meq | EXTENDED_RELEASE_TABLET | ORAL | Status: AC
  Administered 2021-06-18 (×2): 40 meq via ORAL
  Filled 2021-06-18 (×2): qty 2

## 2021-06-18 NOTE — TOC Transition Note (Signed)
Transition of Care (TOC) - CM/SW Discharge Note ? ? ?Patient Details  ?Name: Melissa West ?MRN: [id number] ?Date of Birth: [birth year] ? ?Transition of Care (TOC) CM/SW Contact:  ?Villa Herb, LCSWA ?Phone Number: ?06/18/2021, 10:50 AM ? ? ?Clinical Narrative:    ?Pt is high risk for readmission. Pt is from home with her mother who is her caregiver. CSW spoke to [name] with [facility] who states that they are active with pt for [facility] PT and RN services. Pt will need new HH orders prior to D/C. TOC to follow.  ? ?  ?Barriers to Discharge: Continued Medical Work up ? ? ?Patient Goals and CMS Choice ?Patient states their goals for this hospitalization and ongoing recovery are:: Return home ?CMS Medicare.gov Compare Post Acute Care list provided to:: Patient ?Choice offered to / list presented to : Patient, Parent ? ?Discharge Placement ?  ?           ?  ?  ?  ?  ? ?Discharge Plan and Services ?In-house Referral: Clinical Social Work ?Discharge Planning Services: CM Consult ?Post Acute Care Choice: Home Health          ?  ?  ?  ?  ?  ?  ?  ?  ?  ?  ? ?Social Determinants of Health (SDOH) Interventions ?  ? ? ?Readmission Risk Interventions ?Readmission Risk Prevention Plan 05/20/2021 03/11/2021 03/10/2021  ?Post Dischage Appt - Complete -  ?Medication Screening - Complete Complete  ?Transportation Screening - Complete Complete  ?Home Care Screening Complete - -  ?Medication Review (RN CM) Complete - -  ?Some recent data might be hidden  ? ? ? ? ? ?

## 2021-06-18 NOTE — Progress Notes (Signed)
PROGRESS NOTE     Melissa West, is a 31 y.o. female, DOB - [birth year], OFH:[id number]  Admit date - 06/17/2021   Admitting Physician Jossette Zirbel Denton Brick, MD  Outpatient Primary MD for the patient is Celene Squibb, MD  LOS - 1  Chief Complaint  Patient presents with   Urinary Retention        Brief Narrative:  31 y.o. female with past medical history relevant for multiple sclerosis, wheelchair-bound at baseline, with chronic urinary retention requiring Foley catheter with recurrent UTIs admitted on 06/17/21 due to Proteus/GNR Severe Sepsis  with Septic shock due to UTI in a patient with chronic indwelling Foley catheter in the setting of multiple sclerosis induced urinary retention---    -Assessment and Plan:  1)Proteus/GNR Severe Sepsis  with Septic shock due to UTI in a patient with chronic indwelling Foley catheter in the setting of multiple sclerosis induced urinary retention--- Blood cx from 06/17/21 growing Proteus -Recent urine cultures from 05/14/2021 had Morganella morganii and Proteus -Recent blood cultures from 05/14/2021 grew E. coli and Enterobacter -Patient received IV fluid boluses -Lactic acid improved from 3.9 to 1.1 -WBC is 18.6 >> 17.2 --c/n Iv Zosyn pending further culture Data -Patient has a tendency towards chronic hypotension and supposed to be on midodrine but was not compliant with midodrine PTA -Despite IV fluids in the ED hypotension persisted patient was started on IV Levophed for pressor support -Weaned off iv levophed around 08 am on 06/18/21 -Patient met sepsis criteria on admission---see H and P   2) multiple sclerosis with chronic indwelling Foley catheter --With recurrent UTIs -At baseline patient is wheelchair-bound   3)AKI-- Creatinine on admission was  1.39 - baseline between 0.6-0.8 -Creatinine has normalized, -AKI has resolved - renally adjust medications, avoid nephrotoxic agents / dehydration  / hypotension  4)Chronic Anemia-- Hgb is  10.1, suspect this is partly due to hemodilution -No bleeding concerns,   Disposition/Need for in-Hospital Stay- patient unable to be discharged at this time due to --sepsis with septic shock requiring IV fluids and IV antibiotics*   Status is: Inpatient   Remains inpatient appropriate because:    Dispo: The patient is from: Home              Anticipated d/c is to: Home              Anticipated d/c date is: > 2 days              Patient currently is not medically stable to d/c. Barriers: Not Clinically Stable-   Code Status :  -  Code Status: Full Code   Family Communication:    NA (patient is alert, awake and coherent)   DVT Prophylaxis  :   - SCDs  heparin injection 5,000 Units Start: 06/17/21 2200 SCDs Start: 06/17/21 1843 Place TED hose Start: 06/17/21 1843   Lab Results  Component Value Date   PLT 153 06/18/2021    Inpatient Medications  Scheduled Meds:  Chlorhexidine Gluconate Cloth  6 each Topical Daily   heparin  5,000 Units Subcutaneous Q8H   lactobacillus  1 g Oral TID WC   midodrine  10 mg Oral TID WC   pantoprazole  40 mg Oral Daily   sodium chloride flush  3 mL Intravenous Q12H   sodium chloride flush  3 mL Intravenous Q12H   Continuous Infusions:  sodium chloride 150 mL/hr at 06/18/21 1525   sodium chloride 250 mL (06/17/21 1644)   sodium chloride  norepinephrine (LEVOPHED) Adult infusion Stopped (06/18/21 0726)   piperacillin-tazobactam (ZOSYN)  IV 3.375 g (06/18/21 1608)   PRN Meds:.sodium chloride, acetaminophen **OR** acetaminophen, bisacodyl, ondansetron **OR** ondansetron (ZOFRAN) IV, polyethylene glycol, sodium chloride flush, traZODone   Anti-infectives (From admission, onward)    Start     Dose/Rate Route Frequency Ordered Stop   06/17/21 2200  ceFEPIme (MAXIPIME) 2 g in sodium chloride 0.9 % 100 mL IVPB  Status:  Discontinued        2 g 200 mL/hr over 30 Minutes Intravenous Every 12 hours 06/17/21 1336 06/17/21 1435   06/17/21 2000   ceFEPIme (MAXIPIME) 2 g in sodium chloride 0.9 % 100 mL IVPB  Status:  Discontinued        2 g 200 mL/hr over 30 Minutes Intravenous Every 8 hours 06/17/21 1038 06/17/21 1336   06/17/21 1600  piperacillin-tazobactam (ZOSYN) IVPB 3.375 g        3.375 g 12.5 mL/hr over 240 Minutes Intravenous Every 8 hours 06/17/21 1440     06/17/21 1030  ceFEPIme (MAXIPIME) 2 g in sodium chloride 0.9 % 100 mL IVPB        2 g 200 mL/hr over 30 Minutes Intravenous  Once 06/17/21 1029 06/17/21 1148         Subjective: Melissa West today has no fevers, no emesis,  No chest pain,    No Nausea, Vomiting or Diarrhea   Objective: Vitals:   06/18/21 1430 06/18/21 1500 06/18/21 1530 06/18/21 1600  BP: (!) 94/54 111/71 114/73 105/67  Pulse: 81 90 96 75  Resp: _0 Temp:      TempSrc:      SpO2: 100% 100% 100% 99%  Weight:      Height:        Intake/Output Summary (Last 24 hours) at 06/18/2021 1628 Last data filed at 06/18/2021 1525 Gross per 24 hour  Intake 2582.75 ml  Output 2550 ml  Net 32.75 ml   Filed Weights   06/17/21 1010 06/17/21 1623 06/18/21 0436  Weight: 69.4 kg 65.8 kg 63 kg   Physical Exam  Physical Examination: General appearance - alert,  and in no distress  Mental status - alert, oriented to person, place, and time,  Eyes - sclera anicteric Neck - supple, no JVD elevation , Chest - clear  to auscultation bilaterally, symmetrical air movement,  Heart - S1 and S2 normal, regular , tachycardic Abdomen - soft, nontender, nondistended, no masses or organomegaly Neurological -chronic neuromuscular deficits related to patient's underlying multiple sclerosis without additional new acute deficits Extremities - no pedal edema noted, intact peripheral pulses  Skin - warm, dry GU--foley catheter in situ  Data Reviewed: I have personally reviewed following labs and imaging studies  CBC: Recent Labs  Lab 06/17/21 1050 06/18/21 0415  WBC 18.6* 17.2*  NEUTROABS 17.6*   --   HGB 12.8 10.1*  HCT 38.6 31.4*  MCV 88.1 88.7  PLT 203 536   Basic Metabolic Panel: Recent Labs  Lab 06/17/21 1050 06/18/21 0415  NA 136 140  K 3.9 3.1*  CL 104 111  CO2 23 22  GLUCOSE 112* 110*  BUN 27* 14  CREATININE 1.39* 0.81  CALCIUM 9.5 8.0*   GFR: Estimated Creatinine Clearance: 89.1 mL/min (by C-G formula based on SCr of 0.81 mg/dL). Liver Function Tests: Recent Labs  Lab 06/17/21 1050  AST 27  ALT 14  ALKPHOS 60  BILITOT 0.6  PROT 8.3*  ALBUMIN 3.8   Cardiac  Enzymes: No results for input(s): CKTOTAL, CKMB, CKMBINDEX, TROPONINI in the last 168 hours. BNP (last 3 results) No results for input(s): PROBNP in the last 8760 hours. HbA1C: No results for input(s): HGBA1C in the last 72 hours. Sepsis Labs: _0 (procalcitonin:4,lacticidven:4) ) Recent Results (from the past 240 hour(s))  Blood Culture (routine x 2)     Status: None (Preliminary result)   Collection Time: 06/17/21 10:50 AM   Specimen: BLOOD  Result Value Ref Range Status   Specimen Description   Final    BLOOD RIGHT ANTECUBITAL Performed at [facility], [address] [place], [place] [address]    Special Requests   Final    BOTTLES DRAWN AEROBIC AND ANAEROBIC Blood Culture results may not be optimal due to an excessive volume of blood received in culture bottles Performed at [facility], [address], [place], [place] [address]    Culture  Setup Time   Final    GRAM NEGATIVE RODS BOTTLES DRAWN AEROBIC AND ANAEROBIC Gram Stain Report Called to,Read Back By and Verified With: KINDLEY,C_1  BY MATTHEWS, B 3.9.2023 CRITICAL RESULT CALLED TO, READ BACK BY AND VERIFIED WITH: RN EDGAR TINAJERO 06/18/21_2 :41 BY TW Performed at [facility], [place] [address] [place], [place] [address]    Culture GRAM NEGATIVE RODS  Final   Report Status PENDING  Incomplete  Blood Culture (routine x 2)     Status: None (Preliminary result)   Collection Time: 06/17/21 10:50 AM   Specimen: BLOOD   Result Value Ref Range Status   Specimen Description   Final    BLOOD LEFT ANTECUBITAL Performed at [facility], [address] [place], [place] [address]    Special Requests   Final    BOTTLES DRAWN AEROBIC AND ANAEROBIC Blood Culture adequate volume Performed at [facility], [address] [place], [place] [address]    Culture  Setup Time   Final    GRAM NEGATIVE RODS BOTTLES DRAWN AEROBIC AND ANAEROBIC Gram Stain Report Called to,Read Back By and Verified With: KINDLEY,C_3  BY MATTHEWS, B 3.9.2023 CRITICAL VALUE NOTED.  VALUE IS CONSISTENT WITH PREVIOUSLY REPORTED AND CALLED VALUE. Performed at [facility], [place] [address] [place], [place] [address]    Culture   Final    NO GROWTH 1 DAY Performed at [facility], [address], [place],  [address]    Report Status PENDING  Incomplete  Blood Culture ID Panel (Reflexed)     Status: Abnormal   Collection Time: 06/17/21 10:50 AM  Result Value Ref Range Status   Enterococcus faecalis NOT DETECTED NOT DETECTED Final   Enterococcus Faecium NOT DETECTED NOT DETECTED Final   Listeria monocytogenes NOT DETECTED NOT DETECTED Final   Staphylococcus species NOT DETECTED NOT DETECTED Final   Staphylococcus aureus (BCID) NOT DETECTED NOT DETECTED Final   Staphylococcus epidermidis NOT DETECTED NOT DETECTED Final   Staphylococcus lugdunensis NOT DETECTED NOT DETECTED Final   Streptococcus species NOT DETECTED NOT DETECTED Final   Streptococcus agalactiae NOT DETECTED NOT DETECTED Final   Streptococcus pneumoniae NOT DETECTED NOT DETECTED Final   Streptococcus pyogenes NOT DETECTED NOT DETECTED Final   A.calcoaceticus-baumannii NOT DETECTED NOT DETECTED Final   Bacteroides fragilis NOT DETECTED NOT DETECTED Final   Enterobacterales DETECTED (A) NOT DETECTED Final    Comment: Enterobacterales represent a large order of gram negative bacteria, not a single organism. CRITICAL RESULT CALLED TO, READ BACK BY AND VERIFIED  WITH: RN EDGAR TINAJERO 06/18/21_4 :41 BY TW  Enterobacter cloacae complex NOT DETECTED NOT DETECTED Final   Escherichia coli NOT DETECTED NOT DETECTED Final   Klebsiella aerogenes NOT DETECTED NOT DETECTED Final   Klebsiella oxytoca NOT DETECTED NOT DETECTED Final   Klebsiella pneumoniae NOT DETECTED NOT DETECTED Final   Proteus species DETECTED (A) NOT DETECTED Final    Comment: CRITICAL RESULT CALLED TO, READ BACK BY AND VERIFIED WITH: RN EDGAR TINAJERO 06/18/21_0 :41 BY TW    Salmonella species NOT DETECTED NOT DETECTED Final   Serratia marcescens NOT DETECTED NOT DETECTED Final   Haemophilus influenzae NOT DETECTED NOT DETECTED Final   Neisseria meningitidis NOT DETECTED NOT DETECTED Final   Pseudomonas aeruginosa NOT DETECTED NOT DETECTED Final   Stenotrophomonas maltophilia NOT DETECTED NOT DETECTED Final   Candida albicans NOT DETECTED NOT DETECTED Final   Candida auris NOT DETECTED NOT DETECTED Final   Candida glabrata NOT DETECTED NOT DETECTED Final   Candida krusei NOT DETECTED NOT DETECTED Final   Candida parapsilosis NOT DETECTED NOT DETECTED Final   Candida tropicalis NOT DETECTED NOT DETECTED Final   Cryptococcus neoformans/gattii NOT DETECTED NOT DETECTED Final   CTX-M ESBL NOT DETECTED NOT DETECTED Final   Carbapenem resistance IMP NOT DETECTED NOT DETECTED Final   Carbapenem resistance KPC NOT DETECTED NOT DETECTED Final   Carbapenem resistance NDM NOT DETECTED NOT DETECTED Final   Carbapenem resist OXA 48 LIKE NOT DETECTED NOT DETECTED Final   Carbapenem resistance VIM NOT DETECTED NOT DETECTED Final    Comment: Performed at [facility], [address], [place], [place] [address]  Resp Panel by RT-PCR (Flu A&B, Covid) Nasopharyngeal Swab     Status: None   Collection Time: 06/17/21 11:32 AM   Specimen: Nasopharyngeal Swab; Nasopharyngeal(NP) swabs in vial transport medium  Result Value Ref Range Status   SARS Coronavirus 2 by RT PCR NEGATIVE NEGATIVE Final     Comment: (NOTE) SARS-CoV-2 target nucleic acids are NOT DETECTED.  The SARS-CoV-2 RNA is generally detectable in upper respiratory specimens during the acute phase of infection. The lowest concentration of SARS-CoV-2 viral copies this assay can detect is 138 copies/mL. A negative result does not preclude SARS-Cov-2 infection and should not be used as the sole basis for treatment or other patient management decisions. A negative result may occur with  improper specimen collection/handling, submission of specimen other than nasopharyngeal swab, presence of viral mutation(s) within the areas targeted by this assay, and inadequate number of viral copies(<138 copies/mL). A negative result must be combined with clinical observations, patient history, and epidemiological information. The expected result is Negative.  Fact Sheet for Patients:  EntrepreneurPulse.com.au  Fact Sheet for Healthcare Providers:  IncredibleEmployment.be  This test is no t yet approved or cleared by the [place] FDA and  has been authorized for detection and/or diagnosis of SARS-CoV-2 by FDA under an Emergency Use Authorization (EUA). This EUA will remain  in effect (meaning this test can be used) for the duration of the COVID-19 declaration under Section 564(b)(1) of the Act, 21 U.S.C.section 360bbb-3(b)(1), unless the authorization is terminated  or revoked sooner.       Influenza A by PCR NEGATIVE NEGATIVE Final   Influenza B by PCR NEGATIVE NEGATIVE Final    Comment: (NOTE) The Xpert Xpress SARS-CoV-2/FLU/RSV plus assay is intended as an aid in the diagnosis of influenza from Nasopharyngeal swab specimens and should not be used as a sole basis for treatment. Nasal washings and aspirates are unacceptable for Xpert Xpress SARS-CoV-2/FLU/RSV testing.  Fact  Sheet for Patients: EntrepreneurPulse.com.au  Fact Sheet for Healthcare  Providers: IncredibleEmployment.be  This test is not yet approved or cleared by the [place] FDA and has been authorized for detection and/or diagnosis of SARS-CoV-2 by FDA under an Emergency Use Authorization (EUA). This EUA will remain in effect (meaning this test can be used) for the duration of the COVID-19 declaration under Section 564(b)(1) of the Act, 21 U.S.C. section 360bbb-3(b)(1), unless the authorization is terminated or revoked.  Performed at [facility], [address], [place], [place] [address]   Urine Culture     Status: None (Preliminary result)   Collection Time: 06/17/21  1:03 PM   Specimen: In/Out Cath Urine  Result Value Ref Range Status   Specimen Description   Final    IN/OUT CATH URINE Performed at [facility], [address], [place], [place] [address]    Special Requests   Final    NONE Performed at [facility], [address], [place], [place] [address]    Culture   Final    CULTURE REINCUBATED FOR BETTER GROWTH 50,000 COLONIES/mL PROTEUS PENNERI SUSCEPTIBILITIES TO FOLLOW Performed at [facility] [address], [place], [place] [address]    Report Status PENDING  Incomplete  MRSA Next Gen by PCR, Nasal     Status: None   Collection Time: 06/17/21  8:49 PM   Specimen: Nasal Mucosa; Nasal Swab  Result Value Ref Range Status   MRSA by PCR Next Gen NOT DETECTED NOT DETECTED Final    Comment: (NOTE) The GeneXpert MRSA Assay (FDA approved for NASAL specimens only), is one component of a comprehensive MRSA colonization surveillance program. It is not intended to diagnose MRSA infection nor to guide or monitor treatment for MRSA infections. Test performance is not FDA approved in patients less than 26 years old. Performed at [facility], [address], [place], [place] [address]     Radiology Studies: [facility] Chest [facility] 1 View  Result Date: 06/17/2021 CLINICAL DATA:  Fever and cough EXAM: PORTABLE CHEST 1 VIEW  COMPARISON:  05/16/2021 FINDINGS: Artifact from EKG leads. Normal heart size and mediastinal contours. No acute infiltrate or edema. No effusion or pneumothorax. No acute osseous findings. IMPRESSION: No active disease. Electronically Signed   By: Jorje Guild M.D.   On: 06/17/2021 11:31     Scheduled Meds:  Chlorhexidine Gluconate Cloth  6 each Topical Daily   heparin  5,000 Units Subcutaneous Q8H   lactobacillus  1 g Oral TID WC   midodrine  10 mg Oral TID WC   pantoprazole  40 mg Oral Daily   sodium chloride flush  3 mL Intravenous Q12H   sodium chloride flush  3 mL Intravenous Q12H   Continuous Infusions:  sodium chloride 150 mL/hr at 06/18/21 1525   sodium chloride 250 mL (06/17/21 1644)   sodium chloride     norepinephrine (LEVOPHED) Adult infusion Stopped (06/18/21 0726)   piperacillin-tazobactam (ZOSYN)  IV 3.375 g (06/18/21 1608)     LOS: 1 day    Roxan Hockey M.D on 06/18/2021 at 4:28 PM  Go to [email or web address] - for contact info  [facility] - Office  [phone]  If 7PM-7AM, please contact night-coverage [email or web address] Password [facility] 06/18/2021, 4:28 PM

## 2021-06-18 NOTE — Telephone Encounter (Signed)
Received PA request on CMM for Kesimpta. Key: [id number]. Submitted and waiting on determination from [employer]. ?

## 2021-06-18 NOTE — Plan of Care (Signed)

## 2021-06-18 NOTE — Telephone Encounter (Signed)
Request Reference Number: [id number]. KESIMPTA INJ 20/.4ML is approved through 04/11/2022. Your patient may now fill this prescription and it will be covered. ?

## 2021-06-19 LAB — URINE CULTURE: Culture: >=100000 — AB

## 2021-06-19 MED ORDER — POTASSIUM CHLORIDE CRYS ER 20 MEQ PO TBCR
40.0000 meq | EXTENDED_RELEASE_TABLET | Freq: Once | ORAL | Status: AC
  Administered 2021-06-19: 40 meq via ORAL
  Filled 2021-06-19: qty 2

## 2021-06-19 NOTE — Evaluation (Signed)
Physical Therapy Evaluation ?Patient Details ?Name: Melissa West ?MRN: [id number] ?DOB: 06-14-1990 ?Today's Date: 06/19/2021 ? ?History of Present Illness ? Melissa West  is a 31 y.o. female with past medical history relevant for multiple sclerosis, wheelchair-bound at baseline, with chronic urinary retention requiring Foley catheter with recurrent UTIs presents to the ED with concerns about headaches malaise and clogged Foley catheter ?  ?Clinical Impression ? Patient functioning at baseline for functional mobility which is non-ambulatory, total assist for transfers.  Patient advised to have family members, home aides use mechanical lift for transfers to avoid injury to themselves, presently family and home aides pick patient up and and transfers her into wheelchair manually per patient.  Plan:  Patient discharged from physical therapy to care of nursing for out of bed using mechanical lift as tolerated for length of stay.  ?   ?   ? ?Recommendations for follow up therapy are one component of a multi-disciplinary discharge planning process, led by the attending physician.  Recommendations may be updated based on patient status, additional functional criteria and insurance authorization. ? ?Follow Up Recommendations No PT follow up ? ?  ?Assistance Recommended at Discharge Intermittent Supervision/Assistance  ?Patient can return home with the following ? A lot of help with walking and/or transfers;Assistance with cooking/housework;Help with stairs or ramp for entrance;Two people to help with walking and/or transfers;A lot of help with bathing/dressing/bathroom ? ?  ?Equipment Recommendations None recommended by PT  ?Recommendations for Other Services ?    ?  ?Functional Status Assessment Patient has not had a recent decline in their functional status  ? ?  ?Precautions / Restrictions Precautions ?Precautions: Fall ?Restrictions ?Weight Bearing Restrictions: No  ? ?  ? ?Mobility ? Bed Mobility ?Overal bed  mobility: Needs Assistance ?Bed Mobility: Supine to Sit, Sit to Supine ?  ?  ?Supine to sit: Max assist, Total assist ?Sit to supine: Max assist ?  ?General bed mobility comments: patient unable to voluntarily flex knees due to spasticity and unable to maintain sitting balance ?  ? ?Transfers ?  ?  ?  ?  ?  ?  ?  ?  ?  ?  ?  ? ?Ambulation/Gait ?  ?  ?  ?  ?  ?  ?  ?  ? ?Stairs ?  ?  ?  ?  ?  ? ?Wheelchair Mobility ?  ? ?Modified Rankin (Stroke Patients Only) ?  ? ?  ? ?Balance Overall balance assessment: Needs assistance ?Sitting-balance support: No upper extremity supported, Bilateral upper extremity supported ?Sitting balance-Leahy Scale: Poor ?Sitting balance - Comments: sitting up at EOB ?Postural control: Posterior lean ?  ?  ?  ?  ?  ?  ?  ?  ?  ?  ?  ?  ?  ?  ?   ? ? ? ?Pertinent Vitals/Pain Pain Assessment ?Pain Assessment: No/denies pain  ? ? ?Home Living Family/patient expects to be discharged to:: Private residence ?Living Arrangements: Children ?Available Help at Discharge: Other (Comment) ?Type of Home: House ?Home Access: Ramped entrance ?  ?  ?  ?Home Layout: One level;Able to live on main level with bedroom/bathroom ?Home Equipment: Wheelchair - manual;BSC/3in1;Shower seat ?   ?  ?Prior Function Prior Level of Function : Needs assist ?  ?  ?  ?Physical Assist : Mobility (physical);ADLs (physical) ?Mobility (physical): Bed mobility;Transfers;Gait;Stairs ?  ?Mobility Comments: Patient reports that her home health aide assists her to transfer from bed to wheelchair as needed. ?ADLs  Comments: Assisted by home health aide and family ?  ? ? ?Hand Dominance  ? Dominant Hand: Left ? ?  ?Extremity/Trunk Assessment  ? Upper Extremity Assessment ?Upper Extremity Assessment: Defer to OT evaluation ?  ? ?Lower Extremity Assessment ?Lower Extremity Assessment: Generalized weakness ?  ? ?Cervical / Trunk Assessment ?Cervical / Trunk Assessment: Normal  ?Communication  ? Communication: No difficulties  ?Cognition  Arousal/Alertness: Awake/alert ?Behavior During Therapy: [facility] for tasks assessed/performed ?Overall Cognitive Status: No family/caregiver present to determine baseline cognitive functioning ?  ?  ?  ?  ?  ?  ?  ?  ?  ?  ?  ?  ?  ?  ?  ?  ?  ?  ?  ? ?  ?General Comments   ? ?  ?Exercises    ? ?Assessment/Plan  ?  ?PT Assessment Patient does not need any further PT services  ?PT Problem List   ? ?   ?  ?PT Treatment Interventions     ? ?PT Goals (Current goals can be found in the Care Plan section)  ?Acute Rehab PT Goals ?Patient Stated Goal: return home with family and home aides to assist ?PT Goal Formulation: With patient ?Time For Goal Achievement: 06/19/21 ?Potential to Achieve Goals: Good ? ?  ?Frequency   ?  ? ? ?Co-evaluation   ?  ?  ?  ?  ? ? ?  ?AM-PAC PT "6 Clicks" Mobility  ?Outcome Measure Help needed turning from your back to your side while in a flat bed without using bedrails?: A Lot ?Help needed moving from lying on your back to sitting on the side of a flat bed without using bedrails?: A Lot ?Help needed moving to and from a bed to a chair (including a wheelchair)?: Total ?Help needed standing up from a chair using your arms (e.g., wheelchair or bedside chair)?: Total ?Help needed to walk in hospital room?: Total ?Help needed climbing 3-5 steps with a railing? : Total ?6 Click Score: 8 ? ?  ?End of Session   ?Activity Tolerance: Patient tolerated treatment well;Patient limited by fatigue ?Patient left: in bed;with call bell/phone within reach ?Nurse Communication: Mobility status ?PT Visit Diagnosis: Unsteadiness on feet (R26.81);Other abnormalities of gait and mobility (R26.89);Muscle weakness (generalized) (M62.81) ?  ? ?Time: 3818-2993 ?PT Time Calculation (min) (ACUTE ONLY): 20 min ? ? ?Charges:   PT Evaluation ?$PT Eval Moderate Complexity: 1 Mod ?PT Treatments ?$Therapeutic Activity: 8-22 mins ?  ?   ? ? ?1:53 PM, 06/19/21 ?Ocie Bob, MPT ?Physical Therapist with [place] ?[facility] ?[phone] office ?[phone] mobile phone ? ? ?

## 2021-06-19 NOTE — Care Management Important Message (Signed)
Important Message ? ?Patient Details  ?Name: Melissa West ?MRN: [id number] ?Date of Birth: 08-Feb-1991 ? ? ?Medicare Important Message Given:  Yes ? ? ? ? ?Corey Harold ?06/19/2021, 4:17 PM ?

## 2021-06-19 NOTE — Progress Notes (Signed)
PROGRESS NOTE     Melissa West, is a 31 y.o. female, DOB - Apr 03, 1991, OEV:[id number]  Admit date - 06/17/2021   Admitting Physician Fatin Bachicha Denton Brick, MD  Outpatient Primary MD for the patient is Celene Squibb, MD  LOS - 2  Chief Complaint  Patient presents with   Urinary Retention        Brief Narrative:  31 y.o. female with past medical history relevant for multiple sclerosis, wheelchair-bound at baseline, with chronic urinary retention requiring Foley catheter with recurrent UTIs admitted on 06/17/21 due to Proteus/GNR Severe Sepsis  with Septic shock due to UTI in a patient with chronic indwelling Foley catheter in the setting of multiple sclerosis induced urinary retention---    -Assessment and Plan:  1)Proteus Penneri and Enterococcus faecalis Severe Sepsis  with Septic shock due to UTI in a patient with chronic indwelling Foley catheter in the setting of multiple sclerosis induced urinary retention--- Blood cx from 06/17/21 growing Proteus -Urine cultures from 06/17/2021 with both Enterococcus faecalis and Proteus -Recent urine cultures from 05/14/2021 had Morganella morganii and Proteus -Recent blood cultures from 05/14/2021 grew E. coli and Enterobacter -Patient received IV fluid boluses -Lactic acid improved from 3.9 to 1.1 -WBC is 18.6 >> 17.2 --c/n Iv Zosyn  -Patient has a tendency towards chronic hypotension and supposed to be on midodrine but was not compliant with midodrine PTA -Despite IV fluids in the ED hypotension persisted patient was started on IV Levophed for pressor support -Weaned off iv levophed around 08 am on 06/18/21 -Patient met sepsis criteria on admission---see H and P -Possible discharge on Augmentin if sepsis pathophysiology resolved over the next day or 2   2) multiple sclerosis with chronic indwelling Foley catheter --With recurrent UTIs -At baseline patient is wheelchair-bound   3)AKI-- Creatinine on admission was  1.39 - baseline between  0.6-0.8 -Creatinine has normalized, -AKI has resolved - renally adjust medications, avoid nephrotoxic agents / dehydration  / hypotension  4) acute on chronic Anemia-- Hgb is 10.1, suspect this is partly due to hemodilution -No bleeding concerns,   Disposition/Need for in-Hospital Stay- patient unable to be discharged at this time due to --sepsis with septic shock requiring IV fluids and IV antibiotics- --Possible discharge on Augmentin if sepsis pathophysiology resolved over the next day or 2   Status is: Inpatient   Remains inpatient appropriate because:    Dispo: The patient is from: Home              Anticipated d/c is to: Home with [facility]              Anticipated d/c date is: 1 day              Patient currently is not medically stable to d/c. Barriers: Not Clinically Stable-   Code Status :  -  Code Status: Full Code   Family Communication:    NA (patient is alert, awake and coherent)   DVT Prophylaxis  :   - SCDs  heparin injection 5,000 Units Start: 06/17/21 2200 SCDs Start: 06/17/21 1843 Place TED hose Start: 06/17/21 1843   Lab Results  Component Value Date   PLT 153 06/18/2021    Inpatient Medications  Scheduled Meds:  Chlorhexidine Gluconate Cloth  6 each Topical Daily   heparin  5,000 Units Subcutaneous Q8H   lactobacillus  1 g Oral TID WC   midodrine  10 mg Oral TID WC   pantoprazole  40 mg Oral Daily   potassium  chloride  40 mEq Oral Once   sodium chloride flush  3 mL Intravenous Q12H   sodium chloride flush  3 mL Intravenous Q12H   Continuous Infusions:  sodium chloride 150 mL/hr at 06/19/21 0217   sodium chloride 250 mL (06/17/21 1644)   sodium chloride     norepinephrine (LEVOPHED) Adult infusion Stopped (06/18/21 0726)   piperacillin-tazobactam (ZOSYN)  IV 3.375 g (06/19/21 0825)   PRN Meds:.sodium chloride, acetaminophen **OR** acetaminophen, bisacodyl, ondansetron **OR** ondansetron (ZOFRAN) IV, polyethylene glycol, sodium chloride flush,  traZODone   Anti-infectives (From admission, onward)    Start     Dose/Rate Route Frequency Ordered Stop   06/17/21 2200  ceFEPIme (MAXIPIME) 2 g in sodium chloride 0.9 % 100 mL IVPB  Status:  Discontinued        2 g 200 mL/hr over 30 Minutes Intravenous Every 12 hours 06/17/21 1336 06/17/21 1435   06/17/21 2000  ceFEPIme (MAXIPIME) 2 g in sodium chloride 0.9 % 100 mL IVPB  Status:  Discontinued        2 g 200 mL/hr over 30 Minutes Intravenous Every 8 hours 06/17/21 1038 06/17/21 1336   06/17/21 1600  piperacillin-tazobactam (ZOSYN) IVPB 3.375 g        3.375 g 12.5 mL/hr over 240 Minutes Intravenous Every 8 hours 06/17/21 1440     06/17/21 1030  ceFEPIme (MAXIPIME) 2 g in sodium chloride 0.9 % 100 mL IVPB        2 g 200 mL/hr over 30 Minutes Intravenous  Once 06/17/21 1029 06/17/21 1148         Subjective: Quintessa Hairfield today has no fevers, no emesis,  No chest pain,    -Tolerating oral intake okay -Complains of suprapubic area discomfort   Objective: Vitals:   06/19/21 0800 06/19/21 0900 06/19/21 1000 06/19/21 1100  BP: 118/76 121/72 116/76 122/75  Pulse: 61 (!) 57 (!) 49 (!) 57  Resp: 18 19 15 11   Temp:      TempSrc:      SpO2: 100% 100% 98% 100%  Weight:      Height:        Intake/Output Summary (Last 24 hours) at 06/19/2021 1123 Last data filed at 06/19/2021 0455 Gross per 24 hour  Intake 4274.22 ml  Output 4100 ml  Net 174.22 ml   Filed Weights   06/17/21 1623 06/18/21 0436 06/19/21 0455  Weight: 65.8 kg 63 kg 66.4 kg   Physical Exam  Physical Examination: General appearance - alert,  and in no distress  Mental status - alert, oriented to person, place, and time,  Eyes - sclera anicteric Neck - supple, no JVD elevation , Chest - clear  to auscultation bilaterally, symmetrical air movement,  Heart - S1 and S2 normal, regular , tachycardic Abdomen - soft,   nondistended, mild suprapubic area discomfort on palpation Neurological -chronic  neuromuscular deficits related to patient's underlying multiple sclerosis without additional new acute deficits Extremities - no pedal edema noted, intact peripheral pulses  Skin - warm, dry GU--foley catheter in situ  Data Reviewed: I have personally reviewed following labs and imaging studies  CBC: Recent Labs  Lab 06/17/21 1050 06/18/21 0415  WBC 18.6* 17.2*  NEUTROABS 17.6*  --   HGB 12.8 10.1*  HCT 38.6 31.4*  MCV 88.1 88.7  PLT 203 761   Basic Metabolic Panel: Recent Labs  Lab 06/17/21 1050 06/18/21 0415  NA 136 140  K 3.9 3.1*  CL 104 111  CO2 23 22  GLUCOSE 112* 110*  BUN 27* 14  CREATININE 1.39* 0.81  CALCIUM 9.5 8.0*   GFR: Estimated Creatinine Clearance: 91.3 mL/min (by C-G formula based on SCr of 0.81 mg/dL). Liver Function Tests: Recent Labs  Lab 06/17/21 1050  AST 27  ALT 14  ALKPHOS 60  BILITOT 0.6  PROT 8.3*  ALBUMIN 3.8   Cardiac Enzymes: No results for input(s): CKTOTAL, CKMB, CKMBINDEX, TROPONINI in the last 168 hours. BNP (last 3 results) No results for input(s): PROBNP in the last 8760 hours. HbA1C: No results for input(s): HGBA1C in the last 72 hours. Sepsis Labs: @LABRCNTIP (procalcitonin:4,lacticidven:4) ) Recent Results (from the past 240 hour(s))  Blood Culture (routine x 2)     Status: None (Preliminary result)   Collection Time: 06/17/21 10:50 AM   Specimen: BLOOD  Result Value Ref Range Status   Specimen Description   Final    BLOOD RIGHT ANTECUBITAL Performed at [facility], [address], [place], [place] [address]    Special Requests   Final    BOTTLES DRAWN AEROBIC AND ANAEROBIC Blood Culture results may not be optimal due to an excessive volume of blood received in culture bottles Performed at [place] [place], [place] [address]    Culture  Setup Time   Final    GRAM NEGATIVE RODS BOTTLES DRAWN AEROBIC AND ANAEROBIC Gram Stain Report Called to,Read Back By and Verified With: KINDLEY,C@0217  BY  MATTHEWS, B 3.9.2023 CRITICAL RESULT CALLED TO, READ BACK BY AND VERIFIED WITH: RN EDGAR TINAJERO 06/18/21@5 :41 BY TW Performed at [facility], [place] [address], [place], [place] [address]    Culture GRAM NEGATIVE RODS  Final   Report Status PENDING  Incomplete  Blood Culture (routine x 2)     Status: None (Preliminary result)   Collection Time: 06/17/21 10:50 AM   Specimen: BLOOD  Result Value Ref Range Status   Specimen Description   Final    BLOOD LEFT ANTECUBITAL Performed at [facility], [address], [place], [place] [address]    Special Requests   Final    BOTTLES DRAWN AEROBIC AND ANAEROBIC Blood Culture adequate volume Performed at [facility], [address] [place], [place] [address]    Culture  Setup Time   Final    GRAM NEGATIVE RODS BOTTLES DRAWN AEROBIC AND ANAEROBIC Gram Stain Report Called to,Read Back By and Verified With: KINDLEY,C@0217  BY MATTHEWS, B 3.9.2023 CRITICAL VALUE NOTED.  VALUE IS CONSISTENT WITH PREVIOUSLY REPORTED AND CALLED VALUE. Performed at [facility], [place] [address], [place], [place] [address]    Culture GRAM NEGATIVE RODS  Final   Report Status PENDING  Incomplete  Blood Culture ID Panel (Reflexed)     Status: Abnormal   Collection Time: 06/17/21 10:50 AM  Result Value Ref Range Status   Enterococcus faecalis NOT DETECTED NOT DETECTED Final   Enterococcus Faecium NOT DETECTED NOT DETECTED Final   Listeria monocytogenes NOT DETECTED NOT DETECTED Final   Staphylococcus species NOT DETECTED NOT DETECTED Final   Staphylococcus aureus (BCID) NOT DETECTED NOT DETECTED Final   Staphylococcus epidermidis NOT DETECTED NOT DETECTED Final   Staphylococcus lugdunensis NOT DETECTED NOT DETECTED Final   Streptococcus species NOT DETECTED NOT DETECTED Final   Streptococcus agalactiae NOT DETECTED NOT DETECTED Final   Streptococcus pneumoniae NOT DETECTED NOT DETECTED Final   Streptococcus pyogenes NOT DETECTED NOT DETECTED Final    A.calcoaceticus-baumannii NOT DETECTED NOT DETECTED Final   Bacteroides fragilis NOT DETECTED NOT DETECTED  Final   Enterobacterales DETECTED (A) NOT DETECTED Final    Comment: Enterobacterales represent a large order of gram negative bacteria, not a single organism. CRITICAL RESULT CALLED TO, READ BACK BY AND VERIFIED WITH: RN EDGAR TINAJERO 06/18/21@5 :41 BY TW    Enterobacter cloacae complex NOT DETECTED NOT DETECTED Final   Escherichia coli NOT DETECTED NOT DETECTED Final   Klebsiella aerogenes NOT DETECTED NOT DETECTED Final   Klebsiella oxytoca NOT DETECTED NOT DETECTED Final   Klebsiella pneumoniae NOT DETECTED NOT DETECTED Final   Proteus species DETECTED (A) NOT DETECTED Final    Comment: CRITICAL RESULT CALLED TO, READ BACK BY AND VERIFIED WITH: RN EDGAR TINAJERO 06/18/21@5 :41 BY TW    Salmonella species NOT DETECTED NOT DETECTED Final   Serratia marcescens NOT DETECTED NOT DETECTED Final   Haemophilus influenzae NOT DETECTED NOT DETECTED Final   Neisseria meningitidis NOT DETECTED NOT DETECTED Final   Pseudomonas aeruginosa NOT DETECTED NOT DETECTED Final   Stenotrophomonas maltophilia NOT DETECTED NOT DETECTED Final   Candida albicans NOT DETECTED NOT DETECTED Final   Candida auris NOT DETECTED NOT DETECTED Final   Candida glabrata NOT DETECTED NOT DETECTED Final   Candida krusei NOT DETECTED NOT DETECTED Final   Candida parapsilosis NOT DETECTED NOT DETECTED Final   Candida tropicalis NOT DETECTED NOT DETECTED Final   Cryptococcus neoformans/gattii NOT DETECTED NOT DETECTED Final   CTX-M ESBL NOT DETECTED NOT DETECTED Final   Carbapenem resistance IMP NOT DETECTED NOT DETECTED Final   Carbapenem resistance KPC NOT DETECTED NOT DETECTED Final   Carbapenem resistance NDM NOT DETECTED NOT DETECTED Final   Carbapenem resist OXA 48 LIKE NOT DETECTED NOT DETECTED Final   Carbapenem resistance VIM NOT DETECTED NOT DETECTED Final    Comment: Performed at [facility],  [address] [place], [place] [address]  Resp Panel by RT-PCR (Flu A&B, Covid) Nasopharyngeal Swab     Status: None   Collection Time: 06/17/21 11:32 AM   Specimen: Nasopharyngeal Swab; Nasopharyngeal(NP) swabs in vial transport medium  Result Value Ref Range Status   SARS Coronavirus 2 by RT PCR NEGATIVE NEGATIVE Final    Comment: (NOTE) SARS-CoV-2 target nucleic acids are NOT DETECTED.  The SARS-CoV-2 RNA is generally detectable in upper respiratory specimens during the acute phase of infection. The lowest concentration of SARS-CoV-2 viral copies this assay can detect is 138 copies/mL. A negative result does not preclude SARS-Cov-2 infection and should not be used as the sole basis for treatment or other patient management decisions. A negative result may occur with  improper specimen collection/handling, submission of specimen other than nasopharyngeal swab, presence of viral mutation(s) within the areas targeted by this assay, and inadequate number of viral copies(<138 copies/mL). A negative result must be combined with clinical observations, patient history, and epidemiological information. The expected result is Negative.  Fact Sheet for Patients:  EntrepreneurPulse.com.au  Fact Sheet for Healthcare Providers:  IncredibleEmployment.be  This test is no t yet approved or cleared by the [place] FDA and  has been authorized for detection and/or diagnosis of SARS-CoV-2 by FDA under an Emergency Use Authorization (EUA). This EUA will remain  in effect (meaning this test can be used) for the duration of the COVID-19 declaration under Section 564(b)(1) of the Act, 21 U.S.C.section 360bbb-3(b)(1), unless the authorization is terminated  or revoked sooner.       Influenza A by PCR NEGATIVE NEGATIVE Final   Influenza B by PCR NEGATIVE NEGATIVE Final    Comment: (  NOTE) The Xpert Xpress SARS-CoV-2/FLU/RSV plus assay is intended as an aid in  the diagnosis of influenza from Nasopharyngeal swab specimens and should not be used as a sole basis for treatment. Nasal washings and aspirates are unacceptable for Xpert Xpress SARS-CoV-2/FLU/RSV testing.  Fact Sheet for Patients: EntrepreneurPulse.com.au  Fact Sheet for Healthcare Providers: IncredibleEmployment.be  This test is not yet approved or cleared by the [place] FDA and has been authorized for detection and/or diagnosis of SARS-CoV-2 by FDA under an Emergency Use Authorization (EUA). This EUA will remain in effect (meaning this test can be used) for the duration of the COVID-19 declaration under Section 564(b)(1) of the Act, 21 U.S.C. section 360bbb-3(b)(1), unless the authorization is terminated or revoked.  Performed at [facility], [address], [place], [place] [address]   Urine Culture     Status: Abnormal   Collection Time: 06/17/21  1:03 PM   Specimen: In/Out Cath Urine  Result Value Ref Range Status   Specimen Description   Final    IN/OUT CATH URINE Performed at [facility], [address], [place], [place] [address]    Special Requests   Final    NONE Performed at [facility], [address] [place], [place] [address]    Culture (A)  Final    >=100,000 COLONIES/mL ENTEROCOCCUS FAECALIS 50,000 COLONIES/mL PROTEUS PENNERI    Report Status 06/19/2021 FINAL  Final   Organism ID, Bacteria ENTEROCOCCUS FAECALIS (A)  Final   Organism ID, Bacteria PROTEUS PENNERI (A)  Final      Susceptibility   Enterococcus faecalis - MIC*    AMPICILLIN <=2 SENSITIVE Sensitive     NITROFURANTOIN <=16 SENSITIVE Sensitive     VANCOMYCIN 1 SENSITIVE Sensitive     * >=100,000 COLONIES/mL ENTEROCOCCUS FAECALIS   Proteus penneri - MIC*    AMPICILLIN >=32 RESISTANT Resistant     CEFAZOLIN >=64 RESISTANT Resistant     CEFEPIME <=0.12 SENSITIVE Sensitive     CEFTRIAXONE <=0.25 SENSITIVE Sensitive     CIPROFLOXACIN <=0.25 SENSITIVE  Sensitive     GENTAMICIN <=1 SENSITIVE Sensitive     IMIPENEM 2 SENSITIVE Sensitive     NITROFURANTOIN RESISTANT Resistant     TRIMETH/SULFA <=20 SENSITIVE Sensitive     AMPICILLIN/SULBACTAM 8 SENSITIVE Sensitive     PIP/TAZO <=4 SENSITIVE Sensitive     * 50,000 COLONIES/mL PROTEUS PENNERI  MRSA Next Gen by PCR, Nasal     Status: None   Collection Time: 06/17/21  8:49 PM   Specimen: Nasal Mucosa; Nasal Swab  Result Value Ref Range Status   MRSA by PCR Next Gen NOT DETECTED NOT DETECTED Final    Comment: (NOTE) The GeneXpert MRSA Assay (FDA approved for NASAL specimens only), is one component of a comprehensive MRSA colonization surveillance program. It is not intended to diagnose MRSA infection nor to guide or monitor treatment for MRSA infections. Test performance is not FDA approved in patients less than 77 years old. Performed at [facility], [address], [place], [place] [address]     Radiology Studies: [facility] 1 View  Result Date: 06/17/2021 CLINICAL DATA:  Fever and cough EXAM: PORTABLE CHEST 1 VIEW COMPARISON:  05/16/2021 FINDINGS: Artifact from EKG leads. Normal heart size and mediastinal contours. No acute infiltrate or edema. No effusion or pneumothorax. No acute osseous findings. IMPRESSION: No active disease. Electronically Signed   By: Jorje Guild M.D.   On: 06/17/2021 11:31     Scheduled Meds:  Chlorhexidine Gluconate Cloth  6 each Topical Daily   heparin  5,000 Units Subcutaneous Q8H   lactobacillus  1 g Oral TID WC   midodrine  10 mg Oral TID WC   pantoprazole  40 mg Oral Daily   potassium chloride  40 mEq Oral Once   sodium chloride flush  3 mL Intravenous Q12H   sodium chloride flush  3 mL Intravenous Q12H   Continuous Infusions:  sodium chloride 150 mL/hr at 06/19/21 0217   sodium chloride 250 mL (06/17/21 1644)   sodium chloride     norepinephrine (LEVOPHED) Adult infusion Stopped (06/18/21 0726)   piperacillin-tazobactam (ZOSYN)  IV 3.375 g  (06/19/21 0825)     LOS: 2 days   Roxan Hockey M.D on 06/19/2021 at 11:23 AM  Go to [email or web address] - for contact info  [facility] - Office  [phone]  If 7PM-7AM, please contact night-coverage [email or web address] Password [facility] 06/19/2021, 11:23 AM

## 2021-06-19 NOTE — TOC Progression Note (Addendum)
Transition of Care (TOC) - Progression Note  ? ? ?Patient Details  ?Name: Melissa West ?MRN: [id number] ?Date of Birth: [birth year] ? ?Transition of Care (TOC) CM/SW Contact  ?Villa Herb, LCSWA ?Phone Number: ?06/19/2021, 10:20 AM ? ?Clinical Narrative:    ?CSW requested that MD place [facility] PT/RN/SW orders. CSW updated [facility] rep so they are aware orders will likely be placed today so they can pull the orders. TOC to follow.  ? ?Expected Discharge Plan: Home w Home Health Services ?Barriers to Discharge: Continued Medical Work up ? ?Expected Discharge Plan and Services ?Expected Discharge Plan: Home w Home Health Services ?In-house Referral: Clinical Social Work ?Discharge Planning Services: CM Consult ?Post Acute Care Choice: Home Health ?Living arrangements for the past 2 months: Single Family Home ?                ?  ?  ?  ?  ?  ?  ?  ?  ?  ?  ? ? ?Social Determinants of Health (SDOH) Interventions ?  ? ?Readmission Risk Interventions ?Readmission Risk Prevention Plan 05/20/2021 03/11/2021 03/10/2021  ?Post Dischage Appt - Complete -  ?Medication Screening - Complete Complete  ?Transportation Screening - Complete Complete  ?Home Care Screening Complete - -  ?Medication Review (RN CM) Complete - -  ?Some recent data might be hidden  ? ? ?

## 2021-06-20 LAB — RENAL FUNCTION PANEL
Albumin: 2.6 g/dL — ABNORMAL LOW (ref 3.5–5.0)
Anion gap: 5 (ref 5–15)
BUN: 7 mg/dL (ref 6–20)
CO2: 22 mmol/L (ref 22–32)
Calcium: 8.3 mg/dL — ABNORMAL LOW (ref 8.9–10.3)
Chloride: 112 mmol/L — ABNORMAL HIGH (ref 98–111)
Creatinine, Ser: 0.69 mg/dL (ref 0.44–1.00)
GFR, Estimated: >60 mL/min (ref >60)
Glucose, Bld: 88 mg/dL (ref 70–99)
Phosphorus: 2.9 mg/dL (ref 2.5–4.6)
Potassium: 3.9 mmol/L (ref 3.5–5.1)
Sodium: 139 mmol/L (ref 135–145)

## 2021-06-20 LAB — CBC
HCT: 28 % — ABNORMAL LOW (ref 36.0–46.0)
Hemoglobin: 8.8 g/dL — ABNORMAL LOW (ref 12.0–15.0)
MCH: 27.6 pg (ref 26.0–34.0)
MCHC: 31.4 g/dL (ref 30.0–36.0)
MCV: 87.8 fL (ref 80.0–100.0)
Platelets: 151 10*3/uL (ref 150–400)
RBC: 3.19 MIL/uL — ABNORMAL LOW (ref 3.87–5.11)
RDW: 15.2 % (ref 11.5–15.5)
WBC: 4.7 10*3/uL (ref 4.0–10.5)
nRBC: 0 % (ref 0.0–0.2)

## 2021-06-20 LAB — CULTURE, BLOOD (ROUTINE X 2): Special Requests: ADEQUATE

## 2021-06-20 MED ORDER — ALBUTEROL SULFATE HFA 108 (90 BASE) MCG/ACT IN AERS: 2.0000 | INHALATION_SPRAY | Freq: Four times a day (QID) | RESPIRATORY_TRACT | 1 refills | Status: DC | PRN

## 2021-06-20 MED ORDER — BACLOFEN 10 MG PO TABS: 10.0000 mg | ORAL_TABLET | Freq: Two times a day (BID) | ORAL | 11 refills | Status: DC

## 2021-06-20 MED ORDER — SENNOSIDES-DOCUSATE SODIUM 8.6-50 MG PO TABS: 2.0000 | ORAL_TABLET | Freq: Every day | ORAL | 11 refills | Status: DC

## 2021-06-20 MED ORDER — TRAZODONE HCL 50 MG PO TABS: 50.0000 mg | ORAL_TABLET | Freq: Every evening | ORAL | 2 refills | Status: DC | PRN

## 2021-06-20 MED ORDER — MIDODRINE HCL 10 MG PO TABS: 10.0000 mg | ORAL_TABLET | Freq: Three times a day (TID) | ORAL | 3 refills | Status: DC

## 2021-06-20 MED ORDER — ERGOCALCIFEROL 1.25 MG (50000 UT) PO CAPS: 50000.0000 [IU] | ORAL_CAPSULE | ORAL | 2 refills | Status: DC

## 2021-06-20 MED ORDER — POLYVINYL ALCOHOL 1.4 % OP SOLN: 1.0000 [drp] | OPHTHALMIC | 0 refills | Status: DC | PRN

## 2021-06-20 MED ORDER — AMOXICILLIN-POT CLAVULANATE 875-125 MG PO TABS: 1.0000 | ORAL_TABLET | Freq: Two times a day (BID) | ORAL | 0 refills | Status: AC

## 2021-06-20 NOTE — Discharge Summary (Signed)
Melissa West, is a 31 y.o. female  DOB 04-16-1990  MRN [id number].  Admission date:  06/17/2021  Admitting Physician  Roxan Hockey, MD  Discharge Date:  06/20/2021   Primary MD  Celene Squibb, MD  Recommendations for primary care physician for things to follow:   1)Please Take Midodrine 10 mg tablet 3 times a day to prevent low blood pressure 2)Please take Augmentin antibiotic pills as prescribed--for bacterial infection 3)Please have the home health nurse change your Foley catheter every 3 to 4 weeks 4)Please follow-up with urologist Dr. Alyson Ingles in about 4 weeks for recheck and reevaluation  Admission Diagnosis  AKI (acute kidney injury) ([place]) [N17.9] Sepsis secondary to UTI ([place]) [A41.9, N39.0] Sepsis ([place]) [A41.9]   Discharge Diagnosis  AKI (acute kidney injury) ([place]) [N17.9] Sepsis secondary to UTI ([place]) [A41.9, N39.0] Sepsis ([place]) [A41.9]    Principal Problem:   Sepsis ([place]) Active Problems:   Hypotension   MS (multiple sclerosis) ([place])   Wheelchair dependence   AKI (acute kidney injury) ([place])   Sepsis secondary to UTI [facility])      Past Medical History:  Diagnosis Date   MS (multiple sclerosis) ([place])    No pertinent past medical history     Past Surgical History:  Procedure Laterality Date   CESAREAN SECTION  08/09/2011   Procedure: CESAREAN SECTION;  Surgeon: Guss Bunde, MD;  Location: [facility];  Service: Gynecology;  Laterality: N/A;       HPI  from the history and physical done on the day of admission:    Melissa West  is a 31 y.o. female with past medical history relevant for multiple sclerosis, wheelchair-bound at baseline, with chronic urinary retention requiring Foley catheter with recurrent UTIs presents to the ED with concerns about headaches malaise and clogged Foley catheter - In the ED she is found to be hypotensive with EKG showing sinus  tachycardia-- -Despite IV fluid boluses hypotension persisted patient was started on IV Levophed for pressure support, please note the patient does have tendency towards hypotension and is supposed to be on midodrine at home but she was noncompliant with midodrine prior to admission -In the ED patient is found to have a fever of 101, heart rate in the 140s respiratory rate 24 blood pressure 70/48 -UA suggestive of UTI -Chest x-ray without acute cardiopulmonary findings -WBC 18 K -Creatinine is up to 1.39 -Lactic acid improved to 1.1 from 3.9 after IV fluids    Hospital Course:   Assessment and Plan:  Brief Narrative:  31 y.o. female with past medical history relevant for multiple sclerosis, wheelchair-bound at baseline, with chronic urinary retention requiring Foley catheter with recurrent UTIs admitted on 06/17/21 due to Proteus/GNR Severe Sepsis  with Septic shock due to UTI in a patient with chronic indwelling Foley catheter in the setting of multiple sclerosis induced urinary retention---     -Assessment and Plan:   1)Proteus Penneri and Enterococcus faecalis Severe Sepsis  with Septic shock due to UTI in a patient with chronic indwelling  Foley catheter in the setting of multiple sclerosis induced urinary retention--- Blood cx from 06/17/21 growing Proteus -Urine cultures from 06/17/2021 with both Enterococcus faecalis and Proteus -Recent urine cultures from 05/14/2021 had Morganella morganii and Proteus -Recent blood cultures from 05/14/2021 grew E. coli and Enterobacter -Patient received IV fluid boluses -Lactic acid improved from 3.9 to 1.1 -WBC is 18.6 >> 17.2>> 4.7 -Treated with IV Zosyn, okay to discharge on p.o. Augmentin -Patient has a tendency towards chronic hypotension and supposed to be on midodrine but was not compliant with midodrine PTA -Despite IV fluids in the ED hypotension persisted patient was started on IV Levophed for pressor support -Weaned off iv levophed around 08 am  on 06/18/21 -Patient met sepsis criteria on admission---see H and P -Sepsis pathophysiology has resolved   2) multiple sclerosis with chronic indwelling Foley catheter --With recurrent UTIs -At baseline patient is wheelchair-bound   3)AKI-- Creatinine on admission was  1.39, creatinine is Now 0.69 - baseline between 0.6-0.8 -Creatinine has normalized, -AKI has resolved - renally adjust medications, avoid nephrotoxic agents / dehydration  / hypotension   4) acute on chronic Anemia-- Hgb is down to 8.8 , suspect this is partly due to hemodilution due to IV fluids given as part of sepsis protocol -No bleeding concerns,   Disposition--discharge home with home health orders    Dispo: The patient is from: Home              Anticipated d/c is to: Home with [facility]              Code Status :  -  Code Status: Full Code    Family Communication:    NA (patient is alert, awake and coherent)   Discharge Condition: stable  Follow UP   Follow-up Information     [name], Candee Furbish, MD Follow up.   Specialty: Urology Contact information: [address] [place] [address] [phone]                 Diet and Activity recommendation:  As advised  Discharge Instructions    Discharge Instructions     Call MD for:  difficulty breathing, headache or visual disturbances   Complete by: As directed    Call MD for:  persistant dizziness or light-headedness   Complete by: As directed    Call MD for:  persistant nausea and vomiting   Complete by: As directed    Call MD for:  temperature >100.4   Complete by: As directed    Diet general   Complete by: As directed    Discharge instructions   Complete by: As directed    1)Please Take Midodrine 10 mg tablet 3 times a day to prevent low blood pressure 2)Please take Augmentin antibiotic pills as prescribed--for bacterial infection 3)Please have the home health nurse change your Foley catheter every 3 to 4 weeks 4)Please  follow-up with urologist Dr. Alyson Ingles in about 4 weeks for recheck and reevaluation   Increase activity slowly   Complete by: As directed          Discharge Medications     Allergies as of 06/20/2021   No Known Allergies      Medication List     STOP taking these medications    pantoprazole 40 MG tablet Commonly known as: PROTONIX       TAKE these medications    acetaminophen 325 MG tablet Commonly known as: TYLENOL Take 2 tablets (650 mg total)  by mouth every 6 (six) hours as needed for mild pain (or Fever >/= 101).   albuterol 108 (90 Base) MCG/ACT inhaler Commonly known as: VENTOLIN HFA Inhale 2 puffs into the lungs every 6 (six) hours as needed for wheezing or shortness of breath (cough).   amoxicillin-clavulanate 875-125 MG tablet Commonly known as: Augmentin Take 1 tablet by mouth 2 (two) times daily for 7 days.   baclofen 10 MG tablet Commonly known as: LIORESAL Take 1 tablet (10 mg total) by mouth 2 (two) times daily. Take 1/2 to 1 pill po tid for Muscle spasm What changed:  how much to take how to take this when to take this   ergocalciferol 1.25 MG (50000 UT) capsule Commonly known as: VITAMIN D2 Take 1 capsule (50,000 Units total) by mouth once a week.   lactobacillus Pack Take 1 packet (1 g total) by mouth 3 (three) times daily with meals.   midodrine 10 MG tablet Commonly known as: PROAMATINE Take 1 tablet (10 mg total) by mouth 3 (three) times daily with meals. To Prevent Low Blood Pressure What changed: additional instructions   polyvinyl alcohol 1.4 % ophthalmic solution Commonly known as: LIQUIFILM TEARS Place 1 drop into both eyes as needed for dry eyes.   senna-docusate 8.6-50 MG tablet Commonly known as: Senokot-S Take 2 tablets by mouth at bedtime.   traZODone 50 MG tablet Commonly known as: DESYREL Take 1 tablet (50 mg total) by mouth at bedtime as needed for sleep.        Major procedures and Radiology Reports - PLEASE  review detailed and final reports for all details, in brief -  CT ABDOMEN PELVIS W WO CONTRAST  Result Date: 05/30/2021 CLINICAL DATA:  Generalized abdominal cramping with history of recent sepsis secondary to UTI. EXAM: CT ABDOMEN AND PELVIS WITHOUT AND WITH CONTRAST TECHNIQUE: Multidetector CT imaging of the abdomen and pelvis was performed following the standard protocol before and following the bolus administration of intravenous contrast. RADIATION DOSE REDUCTION: This exam was performed according to the departmental dose-optimization program which includes automated exposure control, adjustment of the mA and/or kV according to patient size and/or use of iterative reconstruction technique. CONTRAST:  130m OMNIPAQUE IOHEXOL 300 MG/ML  SOLN COMPARISON:  May 18, 2021 FINDINGS: Lower chest: No acute abnormality. Hepatobiliary: No focal liver abnormality is seen. Multiple subcentimeter gallstones are seen within the lumen of an otherwise normal-appearing gallbladder. Pancreas: Unremarkable. No pancreatic ductal dilatation or surrounding inflammatory changes. Spleen: Normal in size without focal abnormality. Adrenals/Urinary Tract: Adrenal glands are unremarkable. Kidneys are normal in size, without focal lesions. Moderate severity bilateral hydronephrosis and hydroureter are seen, right greater than left. No renal calculi are identified. A Foley catheter is seen within the lumen of a markedly distended urinary bladder. Subsequent mass effect on the uterus is seen. Stomach/Bowel: Stomach is within normal limits. The appendix is not clearly identified. No evidence of bowel wall thickening, distention, or inflammatory changes. Vascular/Lymphatic: No significant vascular findings are present. No enlarged abdominal or pelvic lymph nodes. Reproductive: The uterus is positioned to the right of midline with uterine fundus extending into the right lower quadrant. The bilateral adnexa are unremarkable. Other: No  abdominal wall hernia or abnormality. No abdominopelvic ascites. Musculoskeletal: No acute or significant osseous findings. IMPRESSION: 1. Moderate severity bilateral hydronephrosis and hydroureter, right greater than left, without evidence of renal calculi. 2. Markedly distended urinary bladder. 3. Cholelithiasis. Electronically Signed   By: TVirgina NorfolkM.D.   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 Multiple sclerosis. EXAM: MRI HEAD WITHOUT AND WITH CONTRAST MRI CERVICAL SPINE WITHOUT AND WITH CONTRAST TECHNIQUE: Multiplanar, multiecho pulse sequences of the brain and surrounding structures, and cervical spine, to include the craniocervical junction and cervicothoracic junction, were obtained without and with intravenous contrast. CONTRAST:  69m GADAVIST GADOBUTROL 1 MMOL/ML IV SOLN COMPARISON:  02/02/2019 FINDINGS: MRI HEAD FINDINGS Brain: Pattern of severe chronic demyelination with T2 hyperintensities in the brainstem, deep cerebellum, periventricular white matter, juxtacortical regions. Multiple areas of black hole from dense chronic demyelination with paucity of central white matter with corpus callosum thinning which which is likely also related to congenital dysmorphism. 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 There is also polymicrogyria symmetrically along accentuated lateral frontal sulci. No complete brain cleft. No abnormal enhancement or restricted diffusion. No convincing change from prior, but the confluent disease limits detection of new areas. Vascular: Major flow voids and vascular enhancements are preserved. Skull and upper cervical spine: Normal marrow signal Sinuses/Orbits: Negative Other: Motion degraded postcontrast imaging. MRI CERVICAL SPINE FINDINGS Alignment: Normal Vertebrae: No fracture,  evidence of discitis, or bone lesion. Cord: Heterogeneously confluent T2 hyperintensity essentially involving the entire visualized cervical and upper thoracic cord, progressed from prior where there were areas of normal appearing spinal cord signal. No noted interval thinning or abnormal enhancement. Posterior Fossa, vertebral arteries, paraspinal tissues: Negative Disc levels: No degenerative changes or impingement IMPRESSION: Brain MRI: 1. Severe chronic demyelination without enhancing disease or clear progression. 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 Cervical MRI: Confluent demyelination of the visible cord, subjectively progressed from 2020. No enhancing disease. Electronically Signed   By: JJorje GuildM.D.   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 Multiple sclerosis. EXAM: MRI HEAD WITHOUT AND WITH CONTRAST MRI CERVICAL SPINE WITHOUT AND WITH CONTRAST TECHNIQUE: Multiplanar, multiecho pulse sequences of the brain and surrounding structures, and cervical spine, to include the craniocervical junction and cervicothoracic junction, were obtained without and with intravenous contrast. CONTRAST:  747mGADAVIST GADOBUTROL 1 MMOL/ML IV SOLN COMPARISON:  02/02/2019 FINDINGS: MRI HEAD FINDINGS Brain: Pattern of severe chronic demyelination with T2 hyperintensities in the brainstem, deep cerebellum, periventricular white matter, juxtacortical regions. Multiple areas of black hole from dense chronic demyelination with paucity of central white matter with corpus callosum thinning which which is likely also related to congenital dysmorphism. 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 There is also polymicrogyria symmetrically along accentuated lateral frontal sulci. No complete brain cleft. No  abnormal enhancement or restricted diffusion. No convincing change from prior, but the confluent disease limits detection of new areas. Vascular: Major flow voids and vascular enhancements are preserved. Skull and upper cervical spine: Normal marrow signal Sinuses/Orbits: Negative Other: Motion degraded postcontrast imaging. MRI CERVICAL SPINE FINDINGS Alignment: Normal Vertebrae: No fracture, evidence of discitis, or bone lesion. Cord: Heterogeneously confluent T2 hyperintensity essentially involving the entire visualized cervical and upper thoracic cord, progressed from prior where there were areas of normal appearing spinal cord signal. No noted interval thinning or abnormal enhancement. Posterior Fossa, vertebral arteries, paraspinal tissues: Negative Disc levels: No degenerative changes or impingement IMPRESSION: Brain MRI: 1. Severe chronic demyelination without enhancing  disease or clear progression. 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 Cervical MRI: Confluent demyelination of the visible cord, subjectively progressed from 2020. No enhancing disease. Electronically Signed   By: Jorje Guild M.D.   On: 06/09/2021 10:41   DG Chest Port 1 View  Result Date: 06/17/2021 CLINICAL DATA:  Fever and cough EXAM: PORTABLE CHEST 1 VIEW COMPARISON:  05/16/2021 FINDINGS: Artifact from EKG leads. Normal heart size and mediastinal contours. No acute infiltrate or edema. No effusion or pneumothorax. No acute osseous findings. IMPRESSION: No active disease. Electronically Signed   By: Jorje Guild M.D.   On: 06/17/2021 11:31    Micro Results  Recent Results (from the past 240 hour(s))  Blood Culture (routine x 2)     Status: None (Preliminary result)   Collection Time: 06/17/21 10:50 AM   Specimen: BLOOD  Result Value Ref Range Status   Specimen Description   Final    BLOOD RIGHT ANTECUBITAL Performed at [facility], [address], [place], [place] [address]    Special  Requests   Final    BOTTLES DRAWN AEROBIC AND ANAEROBIC Blood Culture results may not be optimal due to an excessive volume of blood received in culture bottles Performed at [facility], [address], [place], [place] [address]    Culture  Setup Time   Final    GRAM NEGATIVE RODS BOTTLES DRAWN AEROBIC AND ANAEROBIC Gram Stain Report Called to,Read Back By and Verified With: KINDLEY,C@0217  BY MATTHEWS, B 3.9.2023 CRITICAL RESULT CALLED TO, READ BACK BY AND VERIFIED WITH: RN EDGAR TINAJERO 06/18/21@5 :41 BY TW    Culture   Final    GRAM NEGATIVE RODS IDENTIFICATION AND SUSCEPTIBILITIES TO FOLLOW Performed at [facility], [place] [address] [place], [place] [address]    Report Status PENDING  Incomplete  Blood Culture (routine x 2)     Status: None (Preliminary result)   Collection Time: 06/17/21 10:50 AM   Specimen: BLOOD  Result Value Ref Range Status   Specimen Description   Final    BLOOD LEFT ANTECUBITAL Performed at [facility], [address] [place], [place] [address]    Special Requests   Final    BOTTLES DRAWN AEROBIC AND ANAEROBIC Blood Culture adequate volume Performed at [facility], [address] [place], [place] [address]    Culture  Setup Time   Final    GRAM NEGATIVE RODS BOTTLES DRAWN AEROBIC AND ANAEROBIC Gram Stain Report Called to,Read Back By and Verified With: KINDLEY,C@0217  BY MATTHEWS, B 3.9.2023 CRITICAL VALUE NOTED.  VALUE IS CONSISTENT WITH PREVIOUSLY REPORTED AND CALLED VALUE.    Culture   Final    GRAM NEGATIVE RODS IDENTIFICATION TO FOLLOW Performed at [facility], [place] [address], [place], [place] [address]    Report Status PENDING  Incomplete  Blood Culture ID Panel (Reflexed)     Status: Abnormal   Collection Time: 06/17/21 10:50 AM  Result Value Ref Range Status   Enterococcus faecalis NOT DETECTED NOT DETECTED Final   Enterococcus Faecium NOT DETECTED NOT DETECTED Final   Listeria monocytogenes NOT DETECTED NOT DETECTED Final    Staphylococcus species NOT DETECTED NOT DETECTED Final   Staphylococcus aureus (BCID) NOT DETECTED NOT DETECTED Final   Staphylococcus epidermidis NOT DETECTED NOT DETECTED Final   Staphylococcus lugdunensis NOT DETECTED NOT DETECTED Final   Streptococcus species NOT DETECTED NOT DETECTED Final   Streptococcus agalactiae NOT DETECTED NOT DETECTED Final   Streptococcus pneumoniae NOT DETECTED NOT  DETECTED Final   Streptococcus pyogenes NOT DETECTED NOT DETECTED Final   A.calcoaceticus-baumannii NOT DETECTED NOT DETECTED Final   Bacteroides fragilis NOT DETECTED NOT DETECTED Final   Enterobacterales DETECTED (A) NOT DETECTED Final    Comment: Enterobacterales represent a large order of gram negative bacteria, not a single organism. CRITICAL RESULT CALLED TO, READ BACK BY AND VERIFIED WITH: RN EDGAR TINAJERO 06/18/21@5 :41 BY TW    Enterobacter cloacae complex NOT DETECTED NOT DETECTED Final   Escherichia coli NOT DETECTED NOT DETECTED Final   Klebsiella aerogenes NOT DETECTED NOT DETECTED Final   Klebsiella oxytoca NOT DETECTED NOT DETECTED Final   Klebsiella pneumoniae NOT DETECTED NOT DETECTED Final   Proteus species DETECTED (A) NOT DETECTED Final    Comment: CRITICAL RESULT CALLED TO, READ BACK BY AND VERIFIED WITH: RN EDGAR TINAJERO 06/18/21@5 :41 BY TW    Salmonella species NOT DETECTED NOT DETECTED Final   Serratia marcescens NOT DETECTED NOT DETECTED Final   Haemophilus influenzae NOT DETECTED NOT DETECTED Final   Neisseria meningitidis NOT DETECTED NOT DETECTED Final   Pseudomonas aeruginosa NOT DETECTED NOT DETECTED Final   Stenotrophomonas maltophilia NOT DETECTED NOT DETECTED Final   Candida albicans NOT DETECTED NOT DETECTED Final   Candida auris NOT DETECTED NOT DETECTED Final   Candida glabrata NOT DETECTED NOT DETECTED Final   Candida krusei NOT DETECTED NOT DETECTED Final   Candida parapsilosis NOT DETECTED NOT DETECTED Final   Candida tropicalis NOT DETECTED NOT DETECTED  Final   Cryptococcus neoformans/gattii NOT DETECTED NOT DETECTED Final   CTX-M ESBL NOT DETECTED NOT DETECTED Final   Carbapenem resistance IMP NOT DETECTED NOT DETECTED Final   Carbapenem resistance KPC NOT DETECTED NOT DETECTED Final   Carbapenem resistance NDM NOT DETECTED NOT DETECTED Final   Carbapenem resist OXA 48 LIKE NOT DETECTED NOT DETECTED Final   Carbapenem resistance VIM NOT DETECTED NOT DETECTED Final    Comment: Performed at [facility], [address] [place], [place] [address]  Resp Panel by RT-PCR (Flu A&B, Covid) Nasopharyngeal Swab     Status: None   Collection Time: 06/17/21 11:32 AM   Specimen: Nasopharyngeal Swab; Nasopharyngeal(NP) swabs in vial transport medium  Result Value Ref Range Status   SARS Coronavirus 2 by RT PCR NEGATIVE NEGATIVE Final    Comment: (NOTE) SARS-CoV-2 target nucleic acids are NOT DETECTED.  The SARS-CoV-2 RNA is generally detectable in upper respiratory specimens during the acute phase of infection. The lowest concentration of SARS-CoV-2 viral copies this assay can detect is 138 copies/mL. A negative result does not preclude SARS-Cov-2 infection and should not be used as the sole basis for treatment or other patient management decisions. A negative result may occur with  improper specimen collection/handling, submission of specimen other than nasopharyngeal swab, presence of viral mutation(s) within the areas targeted by this assay, and inadequate number of viral copies(<138 copies/mL). A negative result must be combined with clinical observations, patient history, and epidemiological information. The expected result is Negative.  Fact Sheet for Patients:  EntrepreneurPulse.com.au  Fact Sheet for Healthcare Providers:  IncredibleEmployment.be  This test is no t yet approved or cleared by the [place] FDA and  has been authorized for detection and/or diagnosis of SARS-CoV-2 by FDA  under an Emergency Use Authorization (EUA). This EUA will remain  in effect (meaning this test can be used) for the duration of the COVID-19 declaration under Section 564(b)(1) of the Act, 21 U.S.C.section 360bbb-3(b)(1), unless the authorization is terminated  or  revoked sooner.       Influenza A by PCR NEGATIVE NEGATIVE Final   Influenza B by PCR NEGATIVE NEGATIVE Final    Comment: (NOTE) The Xpert Xpress SARS-CoV-2/FLU/RSV plus assay is intended as an aid in the diagnosis of influenza from Nasopharyngeal swab specimens and should not be used as a sole basis for treatment. Nasal washings and aspirates are unacceptable for Xpert Xpress SARS-CoV-2/FLU/RSV testing.  Fact Sheet for Patients: EntrepreneurPulse.com.au  Fact Sheet for Healthcare Providers: IncredibleEmployment.be  This test is not yet approved or cleared by the [place] FDA and has been authorized for detection and/or diagnosis of SARS-CoV-2 by FDA under an Emergency Use Authorization (EUA). This EUA will remain in effect (meaning this test can be used) for the duration of the COVID-19 declaration under Section 564(b)(1) of the Act, 21 U.S.C. section 360bbb-3(b)(1), unless the authorization is terminated or revoked.  Performed at [facility], [address], [place], [place] [address]   Urine Culture     Status: Abnormal   Collection Time: 06/17/21  1:03 PM   Specimen: In/Out Cath Urine  Result Value Ref Range Status   Specimen Description   Final    IN/OUT CATH URINE Performed at [facility], [address] [place], [place] [address]    Special Requests   Final    NONE Performed at [facility], [address], [place], [place] [address]    Culture (A)  Final    >=100,000 COLONIES/mL ENTEROCOCCUS FAECALIS 50,000 COLONIES/mL PROTEUS PENNERI    Report Status 06/19/2021 FINAL  Final   Organism ID, Bacteria ENTEROCOCCUS FAECALIS (A)  Final   Organism ID, Bacteria  PROTEUS PENNERI (A)  Final      Susceptibility   Enterococcus faecalis - MIC*    AMPICILLIN <=2 SENSITIVE Sensitive     NITROFURANTOIN <=16 SENSITIVE Sensitive     VANCOMYCIN 1 SENSITIVE Sensitive     * >=100,000 COLONIES/mL ENTEROCOCCUS FAECALIS   Proteus penneri - MIC*    AMPICILLIN >=32 RESISTANT Resistant     CEFAZOLIN >=64 RESISTANT Resistant     CEFEPIME <=0.12 SENSITIVE Sensitive     CEFTRIAXONE <=0.25 SENSITIVE Sensitive     CIPROFLOXACIN <=0.25 SENSITIVE Sensitive     GENTAMICIN <=1 SENSITIVE Sensitive     IMIPENEM 2 SENSITIVE Sensitive     NITROFURANTOIN RESISTANT Resistant     TRIMETH/SULFA <=20 SENSITIVE Sensitive     AMPICILLIN/SULBACTAM 8 SENSITIVE Sensitive     PIP/TAZO <=4 SENSITIVE Sensitive     * 50,000 COLONIES/mL PROTEUS PENNERI  MRSA Next Gen by PCR, Nasal     Status: None   Collection Time: 06/17/21  8:49 PM   Specimen: Nasal Mucosa; Nasal Swab  Result Value Ref Range Status   MRSA by PCR Next Gen NOT DETECTED NOT DETECTED Final    Comment: (NOTE) The GeneXpert MRSA Assay (FDA approved for NASAL specimens only), is one component of a comprehensive MRSA colonization surveillance program. It is not intended to diagnose MRSA infection nor to guide or monitor treatment for MRSA infections. Test performance is not FDA approved in patients less than 58 years old. Performed at [facility], [address], [place], [place] [address]     Today   Subjective    Pickrell today has no new complaints  No fever  Or chills   No Nausea, Vomiting or Diarrhea        Patient has been seen and examined prior to discharge   Objective   Blood pressure  123/69, pulse 61, temperature 98 F (36.7 C), resp. rate 19, height 5' 3"  (1.6 m), weight 73.4 kg, SpO2 100 %.   Intake/Output Summary (Last 24 hours) at 06/20/2021 1110 Last data filed at 06/20/2021 0900 Gross per 24 hour  Intake 600 ml  Output 3525 ml  Net -2925 ml    Exam   Physical Examination:  General appearance - alert,  and in no distress  Mental status - alert, oriented to person, place, and time,  Eyes - sclera anicteric Neck - supple, no JVD elevation , Chest - clear  to auscultation bilaterally, symmetrical air movement,  Heart - S1 and S2 normal, regular , tachycardic Abdomen - soft, ND, NT, +BS, no CVA area tenderness Neurological -chronic neuromuscular deficits related to patient's underlying multiple sclerosis without additional new acute deficits Extremities - no pedal edema noted, intact peripheral pulses  Skin - warm, dry GU--foley catheter in situ   Data Review   CBC w Diff:  Lab Results  Component Value Date   WBC 4.7 06/20/2021   HGB 8.8 (L) 06/20/2021   HGB 13.2 05/13/2021   HCT 28.0 (L) 06/20/2021   HCT 40.2 05/13/2021   PLT 151 06/20/2021   PLT 260 05/13/2021   LYMPHOPCT 2 06/17/2021   BANDSPCT 9 05/14/2021   MONOPCT 2 06/17/2021   EOSPCT 0 06/17/2021   BASOPCT 0 06/17/2021    CMP:  Lab Results  Component Value Date   NA 139 06/20/2021   NA 143 05/13/2021   K 3.9 06/20/2021   CL 112 (H) 06/20/2021   CO2 22 06/20/2021   BUN 7 06/20/2021   BUN 14 05/13/2021   CREATININE 0.69 06/20/2021   PROT 8.3 (H) 06/17/2021   PROT 7.8 05/13/2021   ALBUMIN 2.6 (L) 06/20/2021   ALBUMIN 4.2 05/13/2021   BILITOT 0.6 06/17/2021   BILITOT <0.2 05/13/2021   ALKPHOS 60 06/17/2021   AST 27 06/17/2021   ALT 14 06/17/2021  .  Total Discharge time is about 33 minutes  Roxan Hockey M.D on 06/20/2021 at 11:10 AM  Go to [email or web address] -  for contact info  [facility] - Office  [phone]

## 2021-06-20 NOTE — Progress Notes (Signed)
Nsg Discharge Note ? ?Admit Date:  06/17/2021 ?Discharge date: 06/20/2021 ?  ?Ceira Fickle to be D/C'd Home per MD order.  AVS completed.  Copy for chart, and copy for patient signed, and dated. ?Patient/caregiver able to verbalize understanding. ? ?Discharge Medication: ?Allergies as of 06/20/2021   ?No Known Allergies ?  ? ?  ?Medication List  ?  ? ?STOP taking these medications   ? ?pantoprazole 40 MG tablet ?Commonly known as: PROTONIX ?  ? ?  ? ?TAKE these medications   ? ?acetaminophen 325 MG tablet ?Commonly known as: TYLENOL ?Take 2 tablets (650 mg total) by mouth every 6 (six) hours as needed for mild pain (or Fever >/= 101). ?  ?albuterol 108 (90 Base) MCG/ACT inhaler ?Commonly known as: VENTOLIN HFA ?Inhale 2 puffs into the lungs every 6 (six) hours as needed for wheezing or shortness of breath (cough). ?  ?amoxicillin-clavulanate 875-125 MG tablet ?Commonly known as: Augmentin ?Take 1 tablet by mouth 2 (two) times daily for 7 days. ?  ?baclofen 10 MG tablet ?Commonly known as: LIORESAL ?Take 1 tablet (10 mg total) by mouth 2 (two) times daily. Take 1/2 to 1 pill po tid for Muscle spasm ?What changed:  ?how much to take ?how to take this ?when to take this ?  ?ergocalciferol 1.25 MG (50000 UT) capsule ?Commonly known as: VITAMIN D2 ?Take 1 capsule (50,000 Units total) by mouth once a week. ?  ?lactobacillus Pack ?Take 1 packet (1 g total) by mouth 3 (three) times daily with meals. ?  ?midodrine 10 MG tablet ?Commonly known as: PROAMATINE ?Take 1 tablet (10 mg total) by mouth 3 (three) times daily with meals. To Prevent Low Blood Pressure ?What changed: additional instructions ?  ?polyvinyl alcohol 1.4 % ophthalmic solution ?Commonly known as: LIQUIFILM TEARS ?Place 1 drop into both eyes as needed for dry eyes. ?  ?senna-docusate 8.6-50 MG tablet ?Commonly known as: Senokot-S ?Take 2 tablets by mouth at bedtime. ?  ?traZODone 50 MG tablet ?Commonly known as: DESYREL ?Take 1 tablet (50 mg total) by mouth  at bedtime as needed for sleep. ?  ? ?  ? ? ?Discharge Assessment: ?Vitals:  ? 06/19/21 2120 06/20/21 0443  ?BP: 113/69 123/69  ?Pulse: (!) 53 61  ?Resp: 19 19  ?Temp: 98 ?F (36.7 ?C) 98 ?F (36.7 ?C)  ?SpO2: 100% 100%  ? Skin clean, dry and intact without evidence of skin break down, no evidence of skin tears noted. ?IV catheter discontinued intact. Site without signs and symptoms of complications - no redness or edema noted at insertion site, patient denies c/o pain - only slight tenderness at site.  Dressing with slight pressure applied. ? ?D/c Instructions-Education: ?Discharge instructions given to patient/family with verbalized understanding. ?D/c education completed with patient/family including follow up instructions, medication list, d/c activities limitations if indicated, with other d/c instructions as indicated by MD - patient able to verbalize understanding, all questions fully answered. ?Patient instructed to return to ED, call 911, or call MD for any changes in condition.  ?Patient escorted via WC, and D/C home via private auto. ? ?Demetrio Lapping, LPN ?3/50/0938 18:29 AM  ?

## 2021-06-20 NOTE — Discharge Instructions (Signed)
1)Please Take Midodrine 10 mg tablet 3 times a day to prevent low blood pressure ?2)Please take Augmentin antibiotic pills as prescribed--for bacterial infection ?3)Please have the home health nurse change your Foley catheter every 3 to 4 weeks ?4)Please follow-up with urologist Dr. Ronne Binning in about 4 weeks for recheck and reevaluation ?

## 2021-06-20 NOTE — TOC Transition Note (Signed)
Transition of Care (TOC) - CM/SW Discharge Note ? ? ?Patient Details  ?Name: Melissa West ?MRN: [id number] ?Date of Birth: September 29, 1990 ? ?Transition of Care (TOC) CM/SW Contact:  ?Kerin Salen, RN ?Phone Number: ?06/20/2021, 11:07 AM  ? ? ? ?Final next level of care: [place] ?Barriers to Discharge: Barriers Resolved ? ? ?Patient Goals and CMS Choice ?Patient states their goals for this hospitalization and ongoing recovery are:: Return home ?CMS Medicare.gov Compare Post Acute Care list provided to:: Patient ?Choice offered to / list presented to : Patient, Parent ? ?Discharge Placement ?  ?           ?  ?  ?  ?Patient and family notified of of transfer: 06/20/21 ? ?Discharge Plan and Services ?In-house Referral: Clinical Social Work ?Discharge Planning Services: CM Consult ?Post Acute Care Choice: Home Health          ?DME Arranged: N/A ?DME Agency: NA ?  ?  ?  ?HH Arranged: PT, RN, Social Work ?[place] Agency: [facility] ?Date HH Agency Contacted: 06/19/21 ?  ?  ? ?Social Determinants of Health (SDOH) Interventions ?  ? ? ?Readmission Risk Interventions ?Readmission Risk Prevention Plan 05/20/2021 03/11/2021 03/10/2021  ?Post Dischage Appt - Complete -  ?Medication Screening - Complete Complete  ?Transportation Screening - Complete Complete  ?Home Care Screening Complete - -  ?Medication Review (RN CM) Complete - -  ?Some recent data might be hidden  ? ? ? ? ? ?

## 2021-06-23 ENCOUNTER — Telehealth: Payer: Self-pay

## 2021-06-23 NOTE — Telephone Encounter (Signed)
Patient called office- patient would like to move forward with suprapubic catheter placement. ?

## 2021-06-24 ENCOUNTER — Other Ambulatory Visit: Payer: Self-pay | Admitting: Urology

## 2021-06-24 DIAGNOSIS — T148XXA Other injury of unspecified body region, initial encounter: Secondary | ICD-10-CM | POA: Diagnosis not present

## 2021-06-24 DIAGNOSIS — R339 Retention of urine, unspecified: Secondary | ICD-10-CM

## 2021-06-24 DIAGNOSIS — W19XXXA Unspecified fall, initial encounter: Secondary | ICD-10-CM | POA: Diagnosis not present

## 2021-06-25 DIAGNOSIS — R7881 Bacteremia: Secondary | ICD-10-CM | POA: Diagnosis not present

## 2021-06-25 DIAGNOSIS — R339 Retention of urine, unspecified: Secondary | ICD-10-CM | POA: Diagnosis not present

## 2021-06-25 DIAGNOSIS — A419 Sepsis, unspecified organism: Secondary | ICD-10-CM | POA: Diagnosis not present

## 2021-06-25 DIAGNOSIS — N390 Urinary tract infection, site not specified: Secondary | ICD-10-CM | POA: Diagnosis not present

## 2021-06-25 DIAGNOSIS — I959 Hypotension, unspecified: Secondary | ICD-10-CM | POA: Diagnosis not present

## 2021-06-25 DIAGNOSIS — Z993 Dependence on wheelchair: Secondary | ICD-10-CM | POA: Diagnosis not present

## 2021-06-25 DIAGNOSIS — G35 Multiple sclerosis: Secondary | ICD-10-CM | POA: Diagnosis not present

## 2021-06-25 DIAGNOSIS — D649 Anemia, unspecified: Secondary | ICD-10-CM | POA: Diagnosis not present

## 2021-06-25 DIAGNOSIS — N179 Acute kidney failure, unspecified: Secondary | ICD-10-CM | POA: Diagnosis not present

## 2021-06-25 DIAGNOSIS — Z466 Encounter for fitting and adjustment of urinary device: Secondary | ICD-10-CM | POA: Diagnosis not present

## 2021-06-26 ENCOUNTER — Ambulatory Visit (INDEPENDENT_AMBULATORY_CARE_PROVIDER_SITE_OTHER): Payer: Medicare Other | Admitting: Urology

## 2021-06-26 ENCOUNTER — Other Ambulatory Visit: Payer: Self-pay

## 2021-06-26 VITALS — BP 96/66 | HR 121

## 2021-06-26 DIAGNOSIS — R339 Retention of urine, unspecified: Secondary | ICD-10-CM

## 2021-06-26 DIAGNOSIS — Z7689 Persons encountering health services in other specified circumstances: Secondary | ICD-10-CM | POA: Diagnosis not present

## 2021-06-26 NOTE — Progress Notes (Signed)
? ?06/26/2021 ?10:42 AM  ? ?Melissa West ?1990-05-18 ?[id number] ? ?Referring provider: Benita Stabile, MD ?[address] ?[place] ?[place],  [place] [address] ? ?Urinary retention ? ? ?HPI: ?Ms Melissa West is a 30yo here for followup for urinary retention. She currently has an indwelling foley which is uncomfortable. She has decided to pursue SP tube placement and I awaiting scheduling for her SP tube placement. No other complaints today.  ? ? ?PMH: ?Past Medical History:  ?Diagnosis Date  ? MS (multiple sclerosis) (HCC)   ? No pertinent past medical history   ? ? ?Surgical History: ?Past Surgical History:  ?Procedure Laterality Date  ? CESAREAN SECTION  08/09/2011  ? Procedure: CESAREAN SECTION;  Surgeon: Lesly Dukes, MD;  Location: [facility];  Service: Gynecology;  Laterality: N/A;  ? ? ?Home Medications:  ?Allergies as of 06/26/2021   ?No Known Allergies ?  ? ?  ?Medication List  ?  ? ?  ? Accurate as of June 26, 2021 10:42 AM. If you have any questions, ask your nurse or doctor.  ?  ?  ? ?  ? ?acetaminophen 325 MG tablet ?Commonly known as: TYLENOL ?Take 2 tablets (650 mg total) by mouth every 6 (six) hours as needed for mild pain (or Fever >/= 101). ?  ?albuterol 108 (90 Base) MCG/ACT inhaler ?Commonly known as: VENTOLIN HFA ?Inhale 2 puffs into the lungs every 6 (six) hours as needed for wheezing or shortness of breath (cough). ?  ?amoxicillin-clavulanate 875-125 MG tablet ?Commonly known as: Augmentin ?Take 1 tablet by mouth 2 (two) times daily for 7 days. ?  ?baclofen 10 MG tablet ?Commonly known as: LIORESAL ?Take 1 tablet (10 mg total) by mouth 2 (two) times daily. Take 1/2 to 1 pill po tid for Muscle spasm ?  ?ergocalciferol 1.25 MG (50000 UT) capsule ?Commonly known as: VITAMIN D2 ?Take 1 capsule (50,000 Units total) by mouth once a week. ?  ?lactobacillus Pack ?Take 1 packet (1 g total) by mouth 3 (three) times daily with meals. ?  ?midodrine 10 MG tablet ?Commonly known as: PROAMATINE ?Take 1 tablet (10 mg  total) by mouth 3 (three) times daily with meals. To Prevent Low Blood Pressure ?  ?polyvinyl alcohol 1.4 % ophthalmic solution ?Commonly known as: LIQUIFILM TEARS ?Place 1 drop into both eyes as needed for dry eyes. ?  ?senna-docusate 8.6-50 MG tablet ?Commonly known as: Senokot-S ?Take 2 tablets by mouth at bedtime. ?  ?traZODone 50 MG tablet ?Commonly known as: DESYREL ?Take 1 tablet (50 mg total) by mouth at bedtime as needed for sleep. ?  ? ?  ? ? ?Allergies: No Known Allergies ? ?Family History: ?Family History  ?Problem Relation Age of Onset  ? Diabetes Maternal Grandmother   ? Hypertension Maternal Grandmother   ? Kidney disease Maternal Grandmother   ? ? ?Social History:  reports that she has never smoked. She has never used smokeless tobacco. She reports that she does not drink alcohol and does not use drugs. ? ?ROS: ?All other review of systems were reviewed and are negative except what is noted above in HPI ? ?Physical Exam: ?BP 96/66   Pulse (!) 121   ?Constitutional:  Alert and oriented, No acute distress. ?HEENT: [place] AT, moist mucus membranes.  Trachea midline, no masses. ?Cardiovascular: No clubbing, cyanosis, or edema. ?Respiratory: Normal respiratory effort, no increased work of breathing. ?GI: Abdomen is soft, nontender, nondistended, no abdominal masses ?GU: No CVA tenderness.  ?Lymph: No cervical or  inguinal lymphadenopathy. ?Skin: No rashes, bruises or suspicious lesions. ?Neurologic: Grossly intact, no focal deficits, moving all 4 extremities. ?Psychiatric: Normal mood and affect. ? ?Laboratory Data: ?Lab Results  ?Component Value Date  ? WBC 4.7 06/20/2021  ? HGB 8.8 (L) 06/20/2021  ? HCT 28.0 (L) 06/20/2021  ? MCV 87.8 06/20/2021  ? PLT 151 06/20/2021  ? ? ?Lab Results  ?Component Value Date  ? CREATININE 0.69 06/20/2021  ? ? ?No results found for: PSA ? ?No results found for: TESTOSTERONE ? ?No results found for: HGBA1C ? ?Urinalysis ?   ?Component Value Date/Time  ? COLORURINE YELLOW  06/17/2021 1303  ? APPEARANCEUR TURBID (A) 06/17/2021 1303  ? LABSPEC 1.013 06/17/2021 1303  ? PHURINE 9.0 (H) 06/17/2021 1303  ? GLUCOSEU NEGATIVE 06/17/2021 1303  ? HGBUR SMALL (A) 06/17/2021 1303  ? BILIRUBINUR NEGATIVE 06/17/2021 1303  ? KETONESUR NEGATIVE 06/17/2021 1303  ? PROTEINUR 100 (A) 06/17/2021 1303  ? UROBILINOGEN 0.2 06/13/2009 1128  ? NITRITE POSITIVE (A) 06/17/2021 1303  ? LEUKOCYTESUR MODERATE (A) 06/17/2021 1303  ? ? ?Lab Results  ?Component Value Date  ? BACTERIA FEW (A) 06/17/2021  ? ? ?Pertinent Imaging: ? ?No results found for this or any previous visit. ? ?No results found for this or any previous visit. ? ?No results found for this or any previous visit. ? ?No results found for this or any previous visit. ? ?No results found for this or any previous visit. ? ?No results found for this or any previous visit. ? ?No results found for this or any previous visit. ? ?Results for orders placed during the hospital encounter of 05/14/21 ? ?CT RENAL STONE STUDY ? ?Narrative ?CLINICAL DATA:  Sepsis, urinary retention, indwelling Foley ?catheter, generalized weakness and chills ? ?EXAM: ?CT ABDOMEN AND PELVIS WITHOUT CONTRAST ? ?TECHNIQUE: ?Multidetector CT imaging of the abdomen and pelvis was performed ?following the standard protocol without IV contrast. ? ?RADIATION DOSE REDUCTION: This exam was performed according to the ?departmental dose-optimization program which includes automated ?exposure control, adjustment of the mA and/or kV according to ?patient size and/or use of iterative reconstruction technique. ? ?COMPARISON:  03/07/2021 ? ?FINDINGS: ?Lower chest: No acute pleural or parenchymal lung disease. ? ?Hepatobiliary: Calcified gallstones without evidence of ?cholecystitis. Unremarkable unenhanced appearance of the liver. ? ?Pancreas: Unremarkable unenhanced appearance. ? ?Spleen: Unremarkable unenhanced appearance. ? ?Adrenals/Urinary Tract: Bladder is decompressed with a Foley ?catheter.  No urinary tract calculi or obstructive uropathy within ?either kidney. Evaluation of the renal parenchyma is limited without ?IV contrast. The adrenals are stable. ? ?Stomach/Bowel: No bowel obstruction or ileus. Normal appendix right ?lower quadrant. No bowel wall thickening or inflammatory change. ? ?Vascular/Lymphatic: No significant vascular findings are present. No ?enlarged abdominal or pelvic lymph nodes. ? ?Reproductive: Uterus and bilateral adnexa are unremarkable. ? ?Other: Trace free fluid in the pelvis may be physiologic. No free ?intraperitoneal gas. No abdominal wall hernia. ? ?Musculoskeletal: No acute or destructive bony lesions. Reconstructed ?images demonstrate no additional findings. ? ?IMPRESSION: ?1. Cholelithiasis without cholecystitis. ?2. No evidence of urinary tract calculi or obstructive uropathy. ?Indwelling Foley catheter decompresses the bladder. ?3. Trace pelvic free fluid, likely physiologic. ? ? ?Electronically Signed ?By: Sharlet Salina M.D. ?On: 05/14/2021 17:14 ? ? ?Assessment & Plan:   ?Urinary retention ? ?No follow-ups on file. ? ?Wilkie Aye, MD ? ?[facility] [place] ?  ?

## 2021-06-29 NOTE — Telephone Encounter (Signed)
Received notice from [facility] that patient has a $0 copay for kesimpta but needs to return their calls at [phone] to schedule delivery. ? ?I called patient's mother, Victorino Dike, per [facility] and asked her to call [facility] back to schedule delivery of kesimpta. Patient's mother verbalized understanding. ?

## 2021-06-30 ENCOUNTER — Encounter: Payer: Self-pay | Admitting: Urology

## 2021-06-30 DIAGNOSIS — A419 Sepsis, unspecified organism: Secondary | ICD-10-CM | POA: Diagnosis not present

## 2021-06-30 DIAGNOSIS — Z466 Encounter for fitting and adjustment of urinary device: Secondary | ICD-10-CM | POA: Diagnosis not present

## 2021-06-30 DIAGNOSIS — R7881 Bacteremia: Secondary | ICD-10-CM | POA: Diagnosis not present

## 2021-06-30 DIAGNOSIS — N390 Urinary tract infection, site not specified: Secondary | ICD-10-CM | POA: Diagnosis not present

## 2021-06-30 DIAGNOSIS — G35 Multiple sclerosis: Secondary | ICD-10-CM | POA: Diagnosis not present

## 2021-06-30 DIAGNOSIS — Z993 Dependence on wheelchair: Secondary | ICD-10-CM | POA: Diagnosis not present

## 2021-06-30 DIAGNOSIS — D649 Anemia, unspecified: Secondary | ICD-10-CM | POA: Diagnosis not present

## 2021-06-30 DIAGNOSIS — I959 Hypotension, unspecified: Secondary | ICD-10-CM | POA: Diagnosis not present

## 2021-06-30 DIAGNOSIS — R339 Retention of urine, unspecified: Secondary | ICD-10-CM | POA: Diagnosis not present

## 2021-06-30 DIAGNOSIS — N179 Acute kidney failure, unspecified: Secondary | ICD-10-CM | POA: Diagnosis not present

## 2021-06-30 NOTE — Patient Instructions (Signed)
Suprapubic Catheter Home Guide A suprapubic catheter is a flexible tube that is used to drain urine from the bladder into a collection bag outside the body. The catheter is inserted into the bladder through a small opening in the lower abdomen, above the pubic bone (suprapubic area) and a few inches below your belly button (navel). A tiny balloon filled with germ-free (sterile) water helps to keep the catheter in place. The collection bag must be emptied at least once a day and cleaned at least every other day. The collection bag can be put beside your bed at night and attached to your leg during the day. You may have a large collection bag to use at night and a smaller one to use during the day. Your suprapubic catheter may need to be changed every 4-6 weeks, or as often as recommended by your health care provider. Healing of the tract where the catheter is placed can take 6 weeks to 6 months. During that time, your health care provider may change your catheter. Once the tract is well healed, you or a caregiver will change your suprapubic catheter at home. What are the risks? This catheter is safe to use. However, problems can occur, including: Blocked urine flow. This can occur if the catheter stops working, or if you have a blood clot in your bladder or in the catheter. Irritation of the skin around the catheter. Infection. This can happen if bacteria gets into your bladder. Supplies needed: Two pairs of sterile gloves. Paper towels. Catheter. Two syringes. Sterile water. Sterile cleaning solution. Lubricant. Collection bags. How to change the catheter  Drink plenty of fluids during the hours before you change the catheter. Wash your hands with soap and water. If soap and water are not available, use hand sanitizer. Draw up sterile water into a syringe to have ready to fill the new catheter balloon. The amount will depend on the size of the balloon. Have all of your supplies ready and close  to you on a paper towel. Lie on your back, sitting slightly upright so that you can see the catheter and opening. Put on sterile gloves. Clean the skin around the catheter opening using the sterile cleaning solution. Remove the water from the balloon in the catheter using a syringe. Slowly remove the catheter. If the catheter seems stuck, or if you have difficulty removing it: Do not pull on it. Call your health care provider right away. Place the old catheter on a paper towel to discard later. Take off the used gloves, and put on a new pair. Put lubricant on the end of the new catheter that will go into your bladder. Clean the skin around the catheter opening using the sterile cleaning solution. Gently slide the catheter through the opening in your abdomen and into the tract that leads to your bladder. Wait for some urine to start flowing through the catheter. When urine starts to flow through the catheter, attach the collection bag to the end of the catheter. Make sure the connection is tight. Use a syringe to fill the catheter balloon with sterile water. Fill to the amount directed by your health care provider. Remove the gloves and wash your hands with soap and water. How to care for the skin around the catheter Follow your health care provider's instructions on caring for your skin. Use a clean washcloth and soapy water to clean the skin around your catheter every day. Pat the area dry with a clean paper towel. Do not   pull on the catheter. Do not use ointment or lotion on this area, unless told by your health care provider. Check the skin around the catheter every day for signs of infection. Check for: Redness, swelling, or pain. Fluid or blood. Warmth. Pus or a bad smell. How to empty and clean the collection bag Empty the large collection bag every 8 hours. Empty the small collection bag when it is about ? full. Clean the collection bag every 2-3 days, or as often as told by your  health care provider. To do this: Wash your hands with soap and water. If soap and water are not available, use hand sanitizer. Disconnect the bag from the catheter and immediately attach a new bag to the catheter. Hold the used bag over the toilet or another container. Turn the valve (spigot) at the bottom of the bag to empty the urine. Empty the used bag completely. Do not touch the opening of the spigot. Do not let the opening touch the toilet or container. Close the spigot tightly when the bag is empty. Clean the used bag in one of the following methods: According to the manufacturer's instructions. As told by your health care provider. Let the bag dry completely. Put it in a clean plastic bag before storing it. General tips  Always wash your hands before and after caring for your catheter and collection bag. Use a mild, fragrance-free soap. If soap and water are not available, use hand sanitizer. Clean the outside of the catheter with soap and water as often as told by your health care provider. Always make sure there are no twists or kinks in the catheter tube. Always make sure there are no leaks in the catheter or collection bag. Always wear the leg bag below your knee. Make sure the overnight drainage bag is always lower than the level of your bladder, but do not let it touch the floor. Before you go to sleep, hang the bag inside a wastebasket that is covered by a clean plastic bag. Drink enough fluid to keep your urine pale yellow. Do not take baths, swim, or use a hot tub until your health care provider approves. Ask your health care provider if you may take showers. Contact a heath care provider if: You leak urine. You have redness, swelling, or pain around your catheter. You have fluid or blood coming from your catheter opening. Your catheter opening feels warm to the touch. You have pus or a bad smell coming from your catheter opening. You have a fever or chills. Your urine  flow slows down. Your urine becomes cloudy or smelly. Get help right away if: Your catheter comes out. You have: Nausea. Back pain. Difficulty changing your catheter. Blood in your urine. No urine flow for 1 hour. Summary A suprapubic catheter is a flexible tube that is used to drain urine from the bladder into a collection bag outside the body. Your suprapubic catheter may need to be changed every 4-6 weeks, or as recommended by your health care provider. Follow instructions on how to change the catheter and how to empty and clean the collection bag. Always wash your hands before and after caring for your catheter and collection bag. Drink enough fluid to keep your urine pale yellow. Get help right away if you have difficulty changing your catheter or if there is blood in your urine. This information is not intended to replace advice given to you by your health care provider. Make sure you discuss any questions   you have with your health care provider. Document Revised: 06/07/2019 Document Reviewed: 05/03/2018 Elsevier Patient Education  2022 Elsevier Inc.  

## 2021-07-01 ENCOUNTER — Encounter (HOSPITAL_COMMUNITY): Payer: Self-pay | Admitting: Radiology

## 2021-07-01 NOTE — Progress Notes (Signed)
Patient Name  ?Orvan July Legal Sex  ?Female DOB  ?Jan 16, 1991 SSN  ?[id number] Address  ?[address]  ?[place] [address] Phone  ?[phone] (Home) *Preferred*  ?[phone] [facility])  ? ? RE: CT IMAGE GUIDED DRAINAGE BY PERCUTANEOUS CATHETER ?Received: Today ?Oley Balm, MD  Henry Russel D ?Ok  ?CT suprapubic cath  ? ?[facility]   ?  ?   ?Previous Messages ?  ?----- Message -----  ?From: Henry Russel D  ?Sent: 07/01/2021  10:15 AM EDT  ?To: [facility] Procedure Requests  ?Subject: CT IMAGE GUIDED DRAINAGE BY PERCUTANEOUS CAT*  ? ?Procedure:  CT IMAGE GUIDED DRAINAGE BY PERCUTANEOUS CATHETER  ? ?Reason:  Urinary retention  ? ?History:  MR, CT in computer  ? ?Provider:  Wilkie Aye L  ? ?Provider Contact:  [phone]  ?

## 2021-07-02 DIAGNOSIS — Z993 Dependence on wheelchair: Secondary | ICD-10-CM | POA: Diagnosis not present

## 2021-07-02 DIAGNOSIS — Z466 Encounter for fitting and adjustment of urinary device: Secondary | ICD-10-CM | POA: Diagnosis not present

## 2021-07-02 DIAGNOSIS — N390 Urinary tract infection, site not specified: Secondary | ICD-10-CM | POA: Diagnosis not present

## 2021-07-02 DIAGNOSIS — R339 Retention of urine, unspecified: Secondary | ICD-10-CM | POA: Diagnosis not present

## 2021-07-02 DIAGNOSIS — D649 Anemia, unspecified: Secondary | ICD-10-CM | POA: Diagnosis not present

## 2021-07-02 DIAGNOSIS — G35 Multiple sclerosis: Secondary | ICD-10-CM | POA: Diagnosis not present

## 2021-07-02 DIAGNOSIS — R7881 Bacteremia: Secondary | ICD-10-CM | POA: Diagnosis not present

## 2021-07-02 DIAGNOSIS — I959 Hypotension, unspecified: Secondary | ICD-10-CM | POA: Diagnosis not present

## 2021-07-02 DIAGNOSIS — A419 Sepsis, unspecified organism: Secondary | ICD-10-CM | POA: Diagnosis not present

## 2021-07-02 DIAGNOSIS — N179 Acute kidney failure, unspecified: Secondary | ICD-10-CM | POA: Diagnosis not present

## 2021-07-03 ENCOUNTER — Emergency Department (HOSPITAL_COMMUNITY)
Admission: EM | Admit: 2021-07-03 | Discharge: 2021-07-03 | Disposition: A | Discharge status: Home / Self Care | Payer: Medicare Other | Attending: Emergency Medicine | Admitting: Emergency Medicine

## 2021-07-03 ENCOUNTER — Other Ambulatory Visit: Payer: Self-pay

## 2021-07-03 ENCOUNTER — Encounter (HOSPITAL_COMMUNITY): Payer: Self-pay | Admitting: Emergency Medicine

## 2021-07-03 ENCOUNTER — Emergency Department (HOSPITAL_COMMUNITY): Payer: Medicare Other

## 2021-07-03 DIAGNOSIS — T839XXD Unspecified complication of genitourinary prosthetic device, implant and graft, subsequent encounter: Secondary | ICD-10-CM

## 2021-07-03 DIAGNOSIS — T83091D Other mechanical complication of indwelling urethral catheter, subsequent encounter: Secondary | ICD-10-CM | POA: Diagnosis not present

## 2021-07-03 DIAGNOSIS — T83098A Other mechanical complication of other indwelling urethral catheter, initial encounter: Secondary | ICD-10-CM | POA: Diagnosis not present

## 2021-07-03 DIAGNOSIS — G4489 Other headache syndrome: Secondary | ICD-10-CM | POA: Diagnosis not present

## 2021-07-03 DIAGNOSIS — Z7401 Bed confinement status: Secondary | ICD-10-CM | POA: Diagnosis not present

## 2021-07-03 DIAGNOSIS — R531 Weakness: Secondary | ICD-10-CM | POA: Diagnosis not present

## 2021-07-03 DIAGNOSIS — R279 Unspecified lack of coordination: Secondary | ICD-10-CM | POA: Diagnosis not present

## 2021-07-03 DIAGNOSIS — R339 Retention of urine, unspecified: Secondary | ICD-10-CM | POA: Diagnosis present

## 2021-07-03 DIAGNOSIS — R7989 Other specified abnormal findings of blood chemistry: Secondary | ICD-10-CM | POA: Diagnosis not present

## 2021-07-03 DIAGNOSIS — Z743 Need for continuous supervision: Secondary | ICD-10-CM | POA: Diagnosis not present

## 2021-07-03 DIAGNOSIS — N390 Urinary tract infection, site not specified: Secondary | ICD-10-CM | POA: Diagnosis not present

## 2021-07-03 DIAGNOSIS — R519 Headache, unspecified: Secondary | ICD-10-CM | POA: Diagnosis not present

## 2021-07-03 DIAGNOSIS — T83511A Infection and inflammatory reaction due to indwelling urethral catheter, initial encounter: Secondary | ICD-10-CM | POA: Diagnosis not present

## 2021-07-03 LAB — URINALYSIS, ROUTINE W REFLEX MICROSCOPIC
Bilirubin Urine: NEGATIVE
Glucose, UA: NEGATIVE mg/dL
Ketones, ur: NEGATIVE mg/dL
Nitrite: POSITIVE — AB
Protein, ur: 100 mg/dL — AB
Specific Gravity, Urine: 1.017 (ref 1.005–1.030)
WBC, UA: >50 WBC/hpf — ABNORMAL HIGH (ref 0–5)
pH: 8 (ref 5.0–8.0)

## 2021-07-03 LAB — BASIC METABOLIC PANEL
Anion gap: 10 (ref 5–15)
BUN: 27 mg/dL — ABNORMAL HIGH (ref 6–20)
CO2: 24 mmol/L (ref 22–32)
Calcium: 9.8 mg/dL (ref 8.9–10.3)
Chloride: 103 mmol/L (ref 98–111)
Creatinine, Ser: 1.04 mg/dL — ABNORMAL HIGH (ref 0.44–1.00)
GFR, Estimated: >60 mL/min (ref >60)
Glucose, Bld: 102 mg/dL — ABNORMAL HIGH (ref 70–99)
Potassium: 4.2 mmol/L (ref 3.5–5.1)
Sodium: 137 mmol/L (ref 135–145)

## 2021-07-03 LAB — CBC WITH DIFFERENTIAL/PLATELET
Abs Immature Granulocytes: 0.02 10*3/uL (ref 0.00–0.07)
Basophils Absolute: 0 10*3/uL (ref 0.0–0.1)
Basophils Relative: 0 %
Eosinophils Absolute: 0 10*3/uL (ref 0.0–0.5)
Eosinophils Relative: 0 %
HCT: 41.1 % (ref 36.0–46.0)
Hemoglobin: 13.1 g/dL (ref 12.0–15.0)
Immature Granulocytes: 0 %
Lymphocytes Relative: 7 %
Lymphs Abs: 0.6 10*3/uL — ABNORMAL LOW (ref 0.7–4.0)
MCH: 27.2 pg (ref 26.0–34.0)
MCHC: 31.9 g/dL (ref 30.0–36.0)
MCV: 85.3 fL (ref 80.0–100.0)
Monocytes Absolute: 0.4 10*3/uL (ref 0.1–1.0)
Monocytes Relative: 5 %
Neutro Abs: 7 10*3/uL (ref 1.7–7.7)
Neutrophils Relative %: 88 %
Platelets: 344 10*3/uL (ref 150–400)
RBC: 4.82 MIL/uL (ref 3.87–5.11)
RDW: 15.2 % (ref 11.5–15.5)
WBC: 8.1 10*3/uL (ref 4.0–10.5)
nRBC: 0 % (ref 0.0–0.2)

## 2021-07-03 LAB — I-STAT BETA HCG BLOOD, ED (MC, WL, AP ONLY): I-stat hCG, quantitative: <5 m[IU]/mL (ref <5)

## 2021-07-03 MED ORDER — DEXAMETHASONE SODIUM PHOSPHATE 10 MG/ML IJ SOLN
10.0000 mg | Freq: Once | INTRAMUSCULAR | Status: AC
  Administered 2021-07-03: 10 mg via INTRAVENOUS
  Filled 2021-07-03: qty 1

## 2021-07-03 MED ORDER — CEFPODOXIME PROXETIL 200 MG PO TABS: 200.0000 mg | ORAL_TABLET | Freq: Two times a day (BID) | ORAL | 0 refills | Status: DC

## 2021-07-03 MED ORDER — ACETAMINOPHEN 500 MG PO TABS
1000.0000 mg | ORAL_TABLET | Freq: Once | ORAL | Status: AC
  Administered 2021-07-03: 1000 mg via ORAL
  Filled 2021-07-03: qty 2

## 2021-07-03 MED ORDER — SODIUM CHLORIDE 0.9 % IV BOLUS
1000.0000 mL | Freq: Once | INTRAVENOUS | Status: AC
  Administered 2021-07-03: 1000 mL via INTRAVENOUS

## 2021-07-03 MED ORDER — METOCLOPRAMIDE HCL 5 MG/ML IJ SOLN
10.0000 mg | Freq: Once | INTRAMUSCULAR | Status: AC
  Administered 2021-07-03: 10 mg via INTRAVENOUS
  Filled 2021-07-03: qty 2

## 2021-07-03 MED ORDER — DIPHENHYDRAMINE HCL 50 MG/ML IJ SOLN
50.0000 mg | Freq: Once | INTRAMUSCULAR | Status: AC
  Administered 2021-07-03: 50 mg via INTRAVENOUS
  Filled 2021-07-03: qty 1

## 2021-07-03 NOTE — ED Notes (Signed)
Spoke to pts sister Zeigler Sink) who stated that she will be at the pts home when EMS arrives. ?

## 2021-07-03 NOTE — ED Notes (Signed)
Bladder scan done on pt; showed 875 ml ?

## 2021-07-03 NOTE — ED Triage Notes (Signed)
Pt arrived via EMS for headache and foley replacement, pt incontinent of stool upon arrival. ?

## 2021-07-03 NOTE — ED Notes (Signed)
Pt given meal tray and 2 drinks. No other needs expressed at this time. ?

## 2021-07-03 NOTE — ED Notes (Signed)
Called [facility] to transport pt back home ?

## 2021-07-03 NOTE — ED Notes (Signed)
Pt mother informed of transport home. EMS here for transport at this time. ?

## 2021-07-03 NOTE — Discharge Instructions (Addendum)
Follow-up with your neurologist and let them know you are seen in the ED today.  Also follow-up with urologist regarding scheduling for the SP surgery.  Return to the ED if things change or worsen. ?Take Vantin twice daily for the next 10 days.Marland Kitchen ?

## 2021-07-03 NOTE — ED Provider Notes (Signed)
?[facility] DEPART[facility] ?Provider Note ? ? ?[name]: [id number] ?Arrival date & time: 07/03/21  1215 ? ?  ? ?History ? ?Chief Complaint  ?Patient presents with  ? Headache  ? ? ?Melissa West is a 31 y.o. female. ? ? ?Headache ? ? Patient with medical history notable for MS resulting in chronic Nation due to urinary retention presents today with headache and decreased output from catheter.  Patient states both problems started this morning, she states she has not had any output from her catheter bag.  Denies any fevers, vomiting.  No hematuria that she has noticed.  Also reports she woke up with a headache which has been intermittent, she feels it on the front of her head and the backside of her head.  No obvious triggers, denies any aggravating or alleviating factors.  Not having any abdominal pain, denies any changes in her vision. ? ?Home Medications ?Prior to Admission medications   ?Medication Sig Start Date End Date Taking? Authorizing Provider  ?baclofen (LIORESAL) 10 MG tablet Take 1 tablet (10 mg total) by mouth 2 (two) times daily. Take 1/2 to 1 pill po tid for Muscle spasm 06/20/21  Yes Shon Hale, MD  ?cefpodoxime (VANTIN) 200 MG tablet Take 1 tablet (200 mg total) by mouth 2 (two) times daily. 07/03/21  Yes Theron Arista, PA-C  ?ergocalciferol (VITAMIN D2) 1.25 MG (50000 UT) capsule Take 1 capsule (50,000 Units total) by mouth once a week. 06/20/21  Yes Emokpae, Courage, MD  ?KESIMPTA 20 MG/0.4ML SOAJ Inject 20 mg into the skin every 30 (thirty) days. 06/24/21  Yes [provider]  ?acetaminophen (TYLENOL) 325 MG tablet Take 2 tablets (650 mg total) by mouth every 6 (six) hours as needed for mild pain (or Fever >/= 101). ?Patient not taking: Reported on 06/26/2021 05/20/21   Shon Hale, MD  ?albuterol (VENTOLIN HFA) 108 (90 Base) MCG/ACT inhaler Inhale 2 puffs into the lungs every 6 (six) hours as needed for wheezing or shortness of breath (cough). ?Patient not taking: Reported on  06/26/2021 06/20/21   Shon Hale, MD  ?lactobacillus (FLORANEX/LACTINEX) PACK Take 1 packet (1 g total) by mouth 3 (three) times daily with meals. ?Patient not taking: Reported on 05/29/2021 05/20/21   Shon Hale, MD  ?midodrine (PROAMATINE) 10 MG tablet Take 1 tablet (10 mg total) by mouth 3 (three) times daily with meals. To Prevent Low Blood Pressure ?Patient not taking: Reported on 06/26/2021 06/20/21   Shon Hale, MD  ?polyvinyl alcohol (LIQUIFILM TEARS) 1.4 % ophthalmic solution Place 1 drop into both eyes as needed for dry eyes. ?Patient not taking: Reported on 06/26/2021 06/20/21   Shon Hale, MD  ?senna-docusate (SENOKOT-S) 8.6-50 MG tablet Take 2 tablets by mouth at bedtime. ?Patient not taking: Reported on 06/26/2021 06/20/21 06/20/22  Shon Hale, MD  ?traZODone (DESYREL) 50 MG tablet Take 1 tablet (50 mg total) by mouth at bedtime as needed for sleep. ?Patient not taking: Reported on 06/26/2021 06/20/21   Shon Hale, MD  ?   ? ?Allergies    ?Patient has no known allergies.   ? ?Review of Systems   ?Review of Systems  ?Neurological:  Positive for headaches.  ? ?Physical Exam ?Updated Vital Signs ?BP 101/67 (BP Location: Left Arm)   Pulse 82   Temp 98.3 ?F (36.8 ?C) (Oral)   Resp 20   Ht 5\' 3"  (1.6 m)   Wt 73.4 kg   SpO2 100%   BMI 28.66 kg/m?  ?Physical Exam ?Vitals and nursing note  reviewed. Exam conducted with a chaperone present.  ?Constitutional:   ?   General: She is not in acute distress. ?   Appearance: Normal appearance.  ?HENT:  ?   Head: Normocephalic and atraumatic.  ?Eyes:  ?   General: No scleral icterus. ?   Extraocular Movements: Extraocular movements intact.  ?   Pupils: Pupils are equal, round, and reactive to light.  ?   Comments: Disconjugate gaze with left exotropia. Nystagmus with all gazes  ?Cardiovascular:  ?   Rate and Rhythm: Normal rate and regular rhythm.  ?Pulmonary:  ?   Effort: Pulmonary effort is normal.  ?   Breath sounds: Normal breath sounds.   ?Musculoskeletal:  ?   Cervical back: Normal range of motion.  ?Skin: ?   Coloration: Skin is not jaundiced.  ?Neurological:  ?   Mental Status: She is alert. Mental status is at baseline.  ?   GCS: GCS eye subscore is 4. GCS verbal subscore is 5. GCS motor subscore is 6.  ?   Coordination: Coordination normal.  ?   Comments: Slight dysarthria   ? ? ?ED Results / Procedures / Treatments   ?Labs ?(all labs ordered are listed, but only abnormal results are displayed) ?Labs Reviewed  ?BASIC METABOLIC PANEL - Abnormal; Notable for the following components:  ?    Result Value  ? Glucose, Bld 102 (*)   ? BUN 27 (*)   ? Creatinine, Ser 1.04 (*)   ? All other components within normal limits  ?CBC WITH DIFFERENTIAL/PLATELET - Abnormal; Notable for the following components:  ? Lymphs Abs 0.6 (*)   ? All other components within normal limits  ?URINALYSIS, ROUTINE W REFLEX MICROSCOPIC - Abnormal; Notable for the following components:  ? APPearance CLOUDY (*)   ? Hgb urine dipstick SMALL (*)   ? Protein, ur 100 (*)   ? Nitrite POSITIVE (*)   ? Leukocytes,Ua LARGE (*)   ? WBC, UA >50 (*)   ? Bacteria, UA RARE (*)   ? Non Squamous Epithelial 0-5 (*)   ? All other components within normal limits  ?URINE CULTURE  ?I-STAT BETA HCG BLOOD, ED ([facility], [facility], [facility] ONLY)  ? ? ?EKG ?None ? ?Radiology ?CT Head Wo Contrast ? ?Result Date: 07/03/2021 ?CLINICAL DATA:  Headache. EXAM: CT HEAD WITHOUT CONTRAST TECHNIQUE: Contiguous axial images were obtained from the base of the skull through the vertex without intravenous contrast. RADIATION DOSE REDUCTION: This exam was performed according to the departmental dose-optimization program which includes automated exposure control, adjustment of the mA and/or kV according to patient size and/or use of iterative reconstruction technique. COMPARISON:  May 14, 2021. FINDINGS: Brain: Mild chronic ischemic white matter disease is noted. No mass effect or midline shift is noted. Ventricular size is within  normal limits. There is no evidence of mass lesion, hemorrhage or acute infarction. Vascular: No hyperdense vessel or unexpected calcification. Skull: Normal. Negative for fracture or focal lesion. Sinuses/Orbits: No acute finding. Other: None. IMPRESSION: Mild chronic ischemic white matter disease is noted which is advanced for age. No acute intracranial abnormality seen. Electronically Signed   By: Lupita Raider M.D.   On: 07/03/2021 13:59   ? ?Procedures ?Procedures  ? ? ?Medications Ordered in ED ?Medications  ?acetaminophen (TYLENOL) tablet 1,000 mg (1,000 mg Oral Given 07/03/21 1421)  ?sodium chloride 0.9 % bolus 1,000 mL (1,000 mLs Intravenous New Bag/Given 07/03/21 1409)  ?metoCLOPramide (REGLAN) injection 10 mg (10 mg Intravenous Given 07/03/21 1419)  ?  dexamethasone (DECADRON) injection 10 mg (10 mg Intravenous Given 07/03/21 1419)  ?diphenhydrAMINE (BENADRYL) injection 50 mg (50 mg Intravenous Given 07/03/21 1419)  ? ? ?ED Course/ Medical Decision Making/ A&P ?  ?                        ?Medical Decision Making ?Amount and/or Complexity of Data Reviewed ?Labs: ordered. ?Radiology: ordered. ? ?Risk ?OTC drugs. ?Prescription drug management. ? ? ?This patient presents to the ED for concern of headache and catheter issue, this involves an extensive number of treatment options, and is a complaint that carries with it a high risk of complications and morbidity.  The differential diagnosis includes renal impairment, SAH, intracranial bleed, MS complication, hydronephrosis or urinary obstruction, other ? ?Patient?s presentation is complicated by their history of MS resulting in chronic urinary retention and chronic indwelling Foley catheter. ? ? ?Additional history obtained:  ? ? ?Reviewed office visit with neurology on 05/13/2021.  Compared to today's neurologic exam roughly unchanged.  No new deficits noted.   ? ?Also reviewed patient's note with urology from 06/26/2021.  Patient has been having issues with  indwelling Foley, scheduling her for SP tube placement. ?  ?Lab Tests: ? ?I ordered, viewed, and personally interpreted labs.  The pertinent results include:   ?Bladder scan showed roughly 876 mL of fluid.   ?CBC witho

## 2021-07-05 LAB — URINE CULTURE: Culture: >=100000 — AB

## 2021-07-07 NOTE — Telephone Encounter (Addendum)
I called patient's mother, Victorino Dike, per DPR. She reports that patient received her Kesimpta and had her first dose last week. She tolerated it well. Patient will let us know if she has questions or concerns. ?

## 2021-07-08 ENCOUNTER — Telehealth: Payer: Self-pay | Admitting: Neurology

## 2021-07-08 DIAGNOSIS — A419 Sepsis, unspecified organism: Secondary | ICD-10-CM | POA: Diagnosis not present

## 2021-07-08 DIAGNOSIS — R339 Retention of urine, unspecified: Secondary | ICD-10-CM | POA: Diagnosis not present

## 2021-07-08 DIAGNOSIS — G35 Multiple sclerosis: Secondary | ICD-10-CM | POA: Diagnosis not present

## 2021-07-08 DIAGNOSIS — Z466 Encounter for fitting and adjustment of urinary device: Secondary | ICD-10-CM | POA: Diagnosis not present

## 2021-07-08 DIAGNOSIS — N179 Acute kidney failure, unspecified: Secondary | ICD-10-CM | POA: Diagnosis not present

## 2021-07-08 DIAGNOSIS — N390 Urinary tract infection, site not specified: Secondary | ICD-10-CM | POA: Diagnosis not present

## 2021-07-08 DIAGNOSIS — D649 Anemia, unspecified: Secondary | ICD-10-CM | POA: Diagnosis not present

## 2021-07-08 DIAGNOSIS — R7881 Bacteremia: Secondary | ICD-10-CM | POA: Diagnosis not present

## 2021-07-08 DIAGNOSIS — Z993 Dependence on wheelchair: Secondary | ICD-10-CM | POA: Diagnosis not present

## 2021-07-08 DIAGNOSIS — I959 Hypotension, unspecified: Secondary | ICD-10-CM | POA: Diagnosis not present

## 2021-07-08 NOTE — Telephone Encounter (Signed)
Called pt back. Advised Dr. Epimenio Foot does not prescribe this, not legal in [place]. Not comfortable in prescribing this. She verbalized understanding. ?

## 2021-07-08 NOTE — Telephone Encounter (Signed)
Pt would like to know what Dr Epimenio Foot thinks about her using medical marijuana, please call. ?

## 2021-07-09 ENCOUNTER — Ambulatory Visit: Payer: Medicare Other | Admitting: Physician Assistant

## 2021-07-09 DIAGNOSIS — I959 Hypotension, unspecified: Secondary | ICD-10-CM | POA: Diagnosis not present

## 2021-07-09 DIAGNOSIS — Z466 Encounter for fitting and adjustment of urinary device: Secondary | ICD-10-CM | POA: Diagnosis not present

## 2021-07-09 DIAGNOSIS — D649 Anemia, unspecified: Secondary | ICD-10-CM | POA: Diagnosis not present

## 2021-07-09 DIAGNOSIS — A419 Sepsis, unspecified organism: Secondary | ICD-10-CM | POA: Diagnosis not present

## 2021-07-09 DIAGNOSIS — Z993 Dependence on wheelchair: Secondary | ICD-10-CM | POA: Diagnosis not present

## 2021-07-09 DIAGNOSIS — N390 Urinary tract infection, site not specified: Secondary | ICD-10-CM | POA: Diagnosis not present

## 2021-07-09 DIAGNOSIS — R339 Retention of urine, unspecified: Secondary | ICD-10-CM | POA: Diagnosis not present

## 2021-07-09 DIAGNOSIS — R7881 Bacteremia: Secondary | ICD-10-CM | POA: Diagnosis not present

## 2021-07-09 DIAGNOSIS — N179 Acute kidney failure, unspecified: Secondary | ICD-10-CM | POA: Diagnosis not present

## 2021-07-09 DIAGNOSIS — G35 Multiple sclerosis: Secondary | ICD-10-CM | POA: Diagnosis not present

## 2021-07-13 ENCOUNTER — Other Ambulatory Visit (HOSPITAL_COMMUNITY): Payer: Self-pay | Admitting: Physician Assistant

## 2021-07-13 ENCOUNTER — Telehealth: Payer: Self-pay | Admitting: Neurology

## 2021-07-13 DIAGNOSIS — G35 Multiple sclerosis: Secondary | ICD-10-CM | POA: Diagnosis not present

## 2021-07-13 DIAGNOSIS — D649 Anemia, unspecified: Secondary | ICD-10-CM | POA: Diagnosis not present

## 2021-07-13 DIAGNOSIS — R339 Retention of urine, unspecified: Secondary | ICD-10-CM | POA: Diagnosis not present

## 2021-07-13 DIAGNOSIS — Z993 Dependence on wheelchair: Secondary | ICD-10-CM | POA: Diagnosis not present

## 2021-07-13 DIAGNOSIS — I959 Hypotension, unspecified: Secondary | ICD-10-CM | POA: Diagnosis not present

## 2021-07-13 DIAGNOSIS — N179 Acute kidney failure, unspecified: Secondary | ICD-10-CM | POA: Diagnosis not present

## 2021-07-13 DIAGNOSIS — Z466 Encounter for fitting and adjustment of urinary device: Secondary | ICD-10-CM | POA: Diagnosis not present

## 2021-07-13 DIAGNOSIS — R7881 Bacteremia: Secondary | ICD-10-CM | POA: Diagnosis not present

## 2021-07-13 DIAGNOSIS — N390 Urinary tract infection, site not specified: Secondary | ICD-10-CM | POA: Diagnosis not present

## 2021-07-13 DIAGNOSIS — A419 Sepsis, unspecified organism: Secondary | ICD-10-CM | POA: Diagnosis not present

## 2021-07-13 NOTE — Telephone Encounter (Signed)
Called and spoke w/ mother, Victorino Dike. She states patient was talking with her about going back to [facility] because she was walking while on this therapy. Mother reminded her this was years ago and MS has progressed since then. Encouraged her to stay on Kesimpta therapy. Reminded her to keep f/u 10/12/21 at 10:00a. If any new or worsening sx that last more than 24 hours, they should call prior to that. She will speak with her daughter again and call if any other questions arise. ?

## 2021-07-13 NOTE — Telephone Encounter (Signed)
Pt asking if can get infusion in [facility] instead of at home. Would like a call from the nurse. ?

## 2021-07-14 ENCOUNTER — Other Ambulatory Visit: Payer: Self-pay

## 2021-07-14 ENCOUNTER — Ambulatory Visit (HOSPITAL_COMMUNITY)
Admission: RE | Admit: 2021-07-14 | Discharge: 2021-07-14 | Disposition: A | Discharge status: Home / Self Care | Payer: Medicare Other | Source: Ambulatory Visit | Attending: Urology | Admitting: Urology

## 2021-07-14 DIAGNOSIS — R339 Retention of urine, unspecified: Secondary | ICD-10-CM | POA: Insufficient documentation

## 2021-07-14 DIAGNOSIS — Z7689 Persons encountering health services in other specified circumstances: Secondary | ICD-10-CM | POA: Diagnosis not present

## 2021-07-14 DIAGNOSIS — G35 Multiple sclerosis: Secondary | ICD-10-CM | POA: Insufficient documentation

## 2021-07-14 DIAGNOSIS — N320 Bladder-neck obstruction: Secondary | ICD-10-CM | POA: Diagnosis not present

## 2021-07-14 LAB — CBC
HCT: 37.1 % (ref 36.0–46.0)
Hemoglobin: 12.4 g/dL (ref 12.0–15.0)
MCH: 28.6 pg (ref 26.0–34.0)
MCHC: 33.4 g/dL (ref 30.0–36.0)
MCV: 85.5 fL (ref 80.0–100.0)
Platelets: 218 10*3/uL (ref 150–400)
RBC: 4.34 MIL/uL (ref 3.87–5.11)
RDW: 15.1 % (ref 11.5–15.5)
WBC: 3.6 10*3/uL — ABNORMAL LOW (ref 4.0–10.5)
nRBC: 0 % (ref 0.0–0.2)

## 2021-07-14 LAB — PROTIME-INR
INR: 1.2 (ref 0.8–1.2)
Prothrombin Time: 14.8 seconds (ref 11.4–15.2)

## 2021-07-14 LAB — PREGNANCY, URINE: Preg Test, Ur: NEGATIVE

## 2021-07-14 MED ORDER — MIDAZOLAM HCL 2 MG/2ML IJ SOLN
INTRAMUSCULAR | Status: AC
  Filled 2021-07-14: qty 4

## 2021-07-14 MED ORDER — FENTANYL CITRATE (PF) 100 MCG/2ML IJ SOLN
INTRAMUSCULAR | Status: AC
  Filled 2021-07-14: qty 4

## 2021-07-14 MED ORDER — SODIUM CHLORIDE 0.9 % IV SOLN: INTRAVENOUS | Status: DC

## 2021-07-14 MED ORDER — LIDOCAINE HCL 1 % IJ SOLN
INTRAMUSCULAR | Status: AC
  Filled 2021-07-14: qty 10

## 2021-07-14 MED ORDER — FENTANYL CITRATE (PF) 100 MCG/2ML IJ SOLN
INTRAMUSCULAR | Status: AC | PRN
Start: 2021-07-14 — End: 2021-07-14
  Administered 2021-07-14 (×4): 25 ug via INTRAVENOUS

## 2021-07-14 MED ORDER — MIDAZOLAM HCL 2 MG/2ML IJ SOLN
INTRAMUSCULAR | Status: AC | PRN
  Administered 2021-07-14: 1 mg via INTRAVENOUS
  Administered 2021-07-14: .5 mg via INTRAVENOUS

## 2021-07-14 NOTE — H&P (Signed)
? ?Chief Complaint: ?Patient was seen in consultation today for urinary retention at the request of [name],[name] ? ?Referring Physician(s): ?[name],[name] ? ?Supervising Physician: Roanna Banning ? ?Patient Status: [facility] - Out-pt ? ?History of Present Illness: ?Melissa West is a 31 y.o. female with history of progressive MS and urinary retention.  She has had a urinary catheter and has noticed decreased output.  She has been experiencing chronic conlonization with the foley.  IR has been asked to place a suprapubic catheter. ? ?Pt reports feeling well this morning with no complaints.   ? ?Past Medical History:  ?Diagnosis Date  ? MS (multiple sclerosis) (HCC)   ? No pertinent past medical history   ? ? ?Past Surgical History:  ?Procedure Laterality Date  ? CESAREAN SECTION  08/09/2011  ? Procedure: CESAREAN SECTION;  Surgeon: Lesly Dukes, MD;  Location: [facility] OR[facility];  Service: Gynecology;  Laterality: N/A;  ? ? ?Allergies: ?Patient has no known allergies. ? ?Medications: ?Prior to Admission medications   ?Medication Sig Start Date End Date Taking? Authorizing Provider  ?acetaminophen (TYLENOL) 325 MG tablet Take 2 tablets (650 mg total) by mouth every 6 (six) hours as needed for mild pain (or Fever >/= 101). ?Patient not taking: Reported on 06/26/2021 05/20/21   Shon Hale, MD  ?albuterol (VENTOLIN HFA) 108 (90 Base) MCG/ACT inhaler Inhale 2 puffs into the lungs every 6 (six) hours as needed for wheezing or shortness of breath (cough). ?Patient not taking: Reported on 06/26/2021 06/20/21   Shon Hale, MD  ?baclofen (LIORESAL) 10 MG tablet Take 1 tablet (10 mg total) by mouth 2 (two) times daily. Take 1/2 to 1 pill po tid for Muscle spasm 06/20/21   Shon Hale, MD  ?cefpodoxime (VANTIN) 200 MG tablet Take 1 tablet (200 mg total) by mouth 2 (two) times daily. 07/03/21   Theron Arista, PA-C  ?ergocalciferol (VITAMIN D2) 1.25 MG (50000 UT) capsule Take 1 capsule (50,000 Units total) by mouth once  a week. 06/20/21   Shon Hale, MD  ?KESIMPTA 20 MG/0.4ML SOAJ Inject 20 mg into the skin every 30 (thirty) days. 06/24/21   [provider]  ?lactobacillus (FLORANEX/LACTINEX) PACK Take 1 packet (1 g total) by mouth 3 (three) times daily with meals. ?Patient not taking: Reported on 05/29/2021 05/20/21   Shon Hale, MD  ?midodrine (PROAMATINE) 10 MG tablet Take 1 tablet (10 mg total) by mouth 3 (three) times daily with meals. To Prevent Low Blood Pressure ?Patient not taking: Reported on 06/26/2021 06/20/21   Shon Hale, MD  ?polyvinyl alcohol (LIQUIFILM TEARS) 1.4 % ophthalmic solution Place 1 drop into both eyes as needed for dry eyes. ?Patient not taking: Reported on 06/26/2021 06/20/21   Shon Hale, MD  ?senna-docusate (SENOKOT-S) 8.6-50 MG tablet Take 2 tablets by mouth at bedtime. ?Patient not taking: Reported on 06/26/2021 06/20/21 06/20/22  Shon Hale, MD  ?traZODone (DESYREL) 50 MG tablet Take 1 tablet (50 mg total) by mouth at bedtime as needed for sleep. ?Patient not taking: Reported on 06/26/2021 06/20/21   Shon Hale, MD  ?  ? ?Family History  ?Problem Relation Age of Onset  ? Diabetes Maternal Grandmother   ? Hypertension Maternal Grandmother   ? Kidney disease Maternal Grandmother   ? ? ?Social History  ? ?Socioeconomic History  ? Marital status: Single  ?  Spouse name: Not on file  ? Number of children: Not on file  ? Years of education: Not on file  ? Highest education level: Not on  file  ?Occupational History  ? Not on file  ?Tobacco Use  ? Smoking status: Never  ? Smokeless tobacco: Never  ?Vaping Use  ? Vaping Use: Never used  ?Substance and Sexual Activity  ? Alcohol use: No  ? Drug use: No  ? Sexual activity: Yes  ?  Birth control/protection: None  ?Other Topics Concern  ? Not on file  ?Social History Narrative  ? Left handed   ? Lives with kid  ? Caffeine use: none  ? ?Social Determinants of Health  ? ?Financial Resource Strain: Not on file  ?Food Insecurity: Not  on file  ?Transportation Needs: Not on file  ?Physical Activity: Not on file  ?Stress: Not on file  ?Social Connections: Not on file  ? ? ?Review of Systems: A 12 point ROS discussed and pertinent positives are indicated in the HPI above.  All other systems are negative. ? ?Review of Systems  ?Constitutional: Negative.   ?HENT: Negative.    ?Eyes: Negative.   ?Respiratory: Negative.    ?Cardiovascular: Negative.   ?Gastrointestinal: Negative.   ?Endocrine: Negative.   ?Genitourinary:  Positive for difficulty urinating.  ?Musculoskeletal: Negative.   ?Skin: Negative.   ?Allergic/Immunologic: Negative.   ?Neurological: Negative.   ?Hematological: Negative.   ?Psychiatric/Behavioral: Negative.    ? ?Vital Signs: ?BP 95/78 (BP Location: Right Arm)   Pulse 77   Temp 98.2 ?F (36.8 ?C) (Oral)   Resp 16   Ht 5\' 3"  (1.6 m)   Wt 161 lb 13.1 oz (73.4 kg)   LMP 06/15/2021   SpO2 98%   BMI 28.66 kg/m?  ? ?Physical Exam ?Vitals reviewed.  ?HENT:  ?   Head: Normocephalic and atraumatic.  ?   Mouth/Throat:  ?   Mouth: Mucous membranes are moist.  ?   Pharynx: Oropharynx is clear.  ?Cardiovascular:  ?   Rate and Rhythm: Normal rate and regular rhythm.  ?   Pulses: Normal pulses.  ?   Heart sounds: Normal heart sounds.  ?Pulmonary:  ?   Effort: Pulmonary effort is normal.  ?   Breath sounds: Normal breath sounds.  ?Abdominal:  ?   General: Abdomen is flat. Bowel sounds are normal.  ?   Palpations: Abdomen is soft.  ?Skin: ?   General: Skin is warm and dry.  ?   Capillary Refill: Capillary refill takes less than 2 seconds.  ?Neurological:  ?   Mental Status: She is alert and oriented to person, place, and time.  ?Psychiatric:     ?   Mood and Affect: Mood normal.     ?   Thought Content: Thought content normal.  ? ? ?Imaging: ?CT Head Wo Contrast ? ?Result Date: 07/03/2021 ?CLINICAL DATA:  Headache. EXAM: CT HEAD WITHOUT CONTRAST TECHNIQUE: Contiguous axial images were obtained from the base of the skull through the vertex  without intravenous contrast. RADIATION DOSE REDUCTION: This exam was performed according to the departmental dose-optimization program which includes automated exposure control, adjustment of the mA and/or kV according to patient size and/or use of iterative reconstruction technique. COMPARISON:  May 14, 2021. FINDINGS: Brain: Mild chronic ischemic white matter disease is noted. No mass effect or midline shift is noted. Ventricular size is within normal limits. There is no evidence of mass lesion, hemorrhage or acute infarction. Vascular: No hyperdense vessel or unexpected calcification. Skull: Normal. Negative for fracture or focal lesion. Sinuses/Orbits: No acute finding. Other: None. IMPRESSION: Mild chronic ischemic white matter disease is noted which is  advanced for age. No acute intracranial abnormality seen. Electronically Signed   By: Lupita Raider M.D.   On: 07/03/2021 13:59  ? ?DG Chest Port 1 View ? ?Result Date: 06/17/2021 ?CLINICAL DATA:  Fever and cough EXAM: PORTABLE CHEST 1 VIEW COMPARISON:  05/16/2021 FINDINGS: Artifact from EKG leads. Normal heart size and mediastinal contours. No acute infiltrate or edema. No effusion or pneumothorax. No acute osseous findings. IMPRESSION: No active disease. Electronically Signed   By: Tiburcio Pea M.D.   On: 06/17/2021 11:31   ? ?Labs: ? ?CBC: ?Recent Labs  ?  06/18/21 ?0415 06/20/21 ?2620 07/03/21 ?1312 07/14/21 ?3559  ?WBC 17.2* 4.7 8.1 3.6*  ?HGB 10.1* 8.8* 13.1 12.4  ?HCT 31.4* 28.0* 41.1 37.1  ?PLT 153 151 344 218  ? ? ?COAGS: ?Recent Labs  ?  05/14/21 ?1423 05/15/21 ?0415 06/17/21 ?1050  ?INR 1.3* 1.5* 1.2  ?APTT 26  --  26  ? ? ?BMP: ?Recent Labs  ?  06/17/21 ?1050 06/18/21 ?0415 06/20/21 ?7416 07/03/21 ?1312  ?NA 136 140 139 137  ?K 3.9 3.1* 3.9 4.2  ?CL 104 111 112* 103  ?CO2 23 22 22 24   ?GLUCOSE 112* 110* 88 102*  ?BUN 27* 14 7 27*  ?CALCIUM 9.5 8.0* 8.3* 9.8  ?CREATININE 1.39* 0.81 0.69 1.04*  ?GFRNONAA 52* >60 >60 >60  ? ? ?LIVER FUNCTION  TESTS: ?Recent Labs  ?  05/17/21 ?0626 05/18/21 ?07/16/21 05/29/21 ?2000 06/17/21 ?1050 06/20/21 ?08/20/21  ?BILITOT 0.2* 0.2* 1.3* 0.6  --   ?AST 140* 76* 24 27  --   ?ALT 125* 110* 26 14  --   ?ALKPHOS 114 140* 82 60  --

## 2021-07-14 NOTE — Procedures (Signed)
Vascular and Interventional Radiology Procedure Note ? ?Patient: Melissa West ?DOB: Sep 23, 1990 ?Medical Record Number: [id number] ?Note Date/Time: 07/14/21 11:19 AM  ? ?Performing Physician: Roanna Banning, MD ?Assistant(s): None ? ?Diagnosis: BOO ? ?Procedure: SUPRAPUBIC DRAINAGE CATHETER PLACEMENT  ? ?Anesthesia: Conscious Sedation ?Complications: None ?Estimated Blood Loss: Minimal ?Specimens: Sent for None ? ?Findings:  ?Successful CT-guided placement of 14 F suprapubic catheter into urinary bladder. ? ?Plan:  ?- Pt to return to VIR for drain upsize to 16 F Council catheter in 2 week(s). ? ?See detailed procedure note with images in PACS. ?The patient tolerated the procedure well without incident or complication and was returned to Recovery in stable condition.  ? ? ?Roanna Banning, MD ?[facility] ?[facility] ? ? ?Pager. [phone] ?Clinic. [phone]  ?

## 2021-07-15 DIAGNOSIS — R519 Headache, unspecified: Secondary | ICD-10-CM | POA: Diagnosis not present

## 2021-07-15 DIAGNOSIS — R339 Retention of urine, unspecified: Secondary | ICD-10-CM | POA: Diagnosis not present

## 2021-07-16 DIAGNOSIS — Z466 Encounter for fitting and adjustment of urinary device: Secondary | ICD-10-CM | POA: Diagnosis not present

## 2021-07-16 DIAGNOSIS — G35 Multiple sclerosis: Secondary | ICD-10-CM | POA: Diagnosis not present

## 2021-07-16 DIAGNOSIS — D649 Anemia, unspecified: Secondary | ICD-10-CM | POA: Diagnosis not present

## 2021-07-16 DIAGNOSIS — R7881 Bacteremia: Secondary | ICD-10-CM | POA: Diagnosis not present

## 2021-07-16 DIAGNOSIS — Z993 Dependence on wheelchair: Secondary | ICD-10-CM | POA: Diagnosis not present

## 2021-07-16 DIAGNOSIS — R339 Retention of urine, unspecified: Secondary | ICD-10-CM | POA: Diagnosis not present

## 2021-07-16 DIAGNOSIS — A419 Sepsis, unspecified organism: Secondary | ICD-10-CM | POA: Diagnosis not present

## 2021-07-16 DIAGNOSIS — N179 Acute kidney failure, unspecified: Secondary | ICD-10-CM | POA: Diagnosis not present

## 2021-07-16 DIAGNOSIS — I959 Hypotension, unspecified: Secondary | ICD-10-CM | POA: Diagnosis not present

## 2021-07-16 DIAGNOSIS — N390 Urinary tract infection, site not specified: Secondary | ICD-10-CM | POA: Diagnosis not present

## 2021-07-19 ENCOUNTER — Other Ambulatory Visit: Payer: Self-pay

## 2021-07-19 ENCOUNTER — Emergency Department (HOSPITAL_COMMUNITY)
Admission: EM | Admit: 2021-07-19 | Discharge: 2021-07-20 | Disposition: A | Discharge status: Home / Self Care | Payer: Medicare Other | Attending: Emergency Medicine | Admitting: Emergency Medicine

## 2021-07-19 ENCOUNTER — Encounter (HOSPITAL_COMMUNITY): Payer: Self-pay | Admitting: Emergency Medicine

## 2021-07-19 DIAGNOSIS — N390 Urinary tract infection, site not specified: Secondary | ICD-10-CM | POA: Insufficient documentation

## 2021-07-19 DIAGNOSIS — T83518A Infection and inflammatory reaction due to other urinary catheter, initial encounter: Secondary | ICD-10-CM | POA: Insufficient documentation

## 2021-07-19 DIAGNOSIS — T83511A Infection and inflammatory reaction due to indwelling urethral catheter, initial encounter: Secondary | ICD-10-CM | POA: Diagnosis not present

## 2021-07-19 DIAGNOSIS — R109 Unspecified abdominal pain: Secondary | ICD-10-CM | POA: Diagnosis not present

## 2021-07-19 DIAGNOSIS — E860 Dehydration: Secondary | ICD-10-CM | POA: Diagnosis not present

## 2021-07-19 DIAGNOSIS — Z79899 Other long term (current) drug therapy: Secondary | ICD-10-CM | POA: Insufficient documentation

## 2021-07-19 DIAGNOSIS — R1030 Lower abdominal pain, unspecified: Secondary | ICD-10-CM | POA: Diagnosis present

## 2021-07-19 DIAGNOSIS — Z743 Need for continuous supervision: Secondary | ICD-10-CM | POA: Diagnosis not present

## 2021-07-19 DIAGNOSIS — T83010A Breakdown (mechanical) of cystostomy catheter, initial encounter: Secondary | ICD-10-CM

## 2021-07-19 LAB — COMPREHENSIVE METABOLIC PANEL
ALT: 16 U/L (ref 0–44)
AST: 16 U/L (ref 15–41)
Albumin: 3.7 g/dL (ref 3.5–5.0)
Alkaline Phosphatase: 61 U/L (ref 38–126)
Anion gap: 10 (ref 5–15)
BUN: 24 mg/dL — ABNORMAL HIGH (ref 6–20)
CO2: 22 mmol/L (ref 22–32)
Calcium: 9.7 mg/dL (ref 8.9–10.3)
Chloride: 106 mmol/L (ref 98–111)
Creatinine, Ser: 1.17 mg/dL — ABNORMAL HIGH (ref 0.44–1.00)
GFR, Estimated: >60 mL/min (ref >60)
Glucose, Bld: 100 mg/dL — ABNORMAL HIGH (ref 70–99)
Potassium: 4.2 mmol/L (ref 3.5–5.1)
Sodium: 138 mmol/L (ref 135–145)
Total Bilirubin: 0.5 mg/dL (ref 0.3–1.2)
Total Protein: 8.6 g/dL — ABNORMAL HIGH (ref 6.5–8.1)

## 2021-07-19 LAB — CBC
HCT: 40.3 % (ref 36.0–46.0)
Hemoglobin: 13.5 g/dL (ref 12.0–15.0)
MCH: 28.2 pg (ref 26.0–34.0)
MCHC: 33.5 g/dL (ref 30.0–36.0)
MCV: 84.1 fL (ref 80.0–100.0)
Platelets: 255 10*3/uL (ref 150–400)
RBC: 4.79 MIL/uL (ref 3.87–5.11)
RDW: 15.1 % (ref 11.5–15.5)
WBC: 7.5 10*3/uL (ref 4.0–10.5)
nRBC: 0 % (ref 0.0–0.2)

## 2021-07-19 LAB — URINALYSIS, ROUTINE W REFLEX MICROSCOPIC
Bacteria, UA: NONE SEEN
Bilirubin Urine: NEGATIVE
Glucose, UA: NEGATIVE mg/dL
Ketones, ur: NEGATIVE mg/dL
Nitrite: POSITIVE — AB
Protein, ur: >=300 mg/dL — AB
RBC / HPF: >50 RBC/hpf — ABNORMAL HIGH (ref 0–5)
Specific Gravity, Urine: 1.014 (ref 1.005–1.030)
WBC, UA: >50 WBC/hpf — ABNORMAL HIGH (ref 0–5)
pH: 8 (ref 5.0–8.0)

## 2021-07-19 LAB — LIPASE, BLOOD: Lipase: 28 U/L (ref 11–51)

## 2021-07-19 NOTE — ED Triage Notes (Signed)
Pt brought in via EMS for c/o severe abdominal pain and urinary retention. Pt recently had suprapubic catheter placed on 07/14/21 @ [facility]. Urine in catheter is all pt has put out in 24hrs which is 100cc and turbid. ?

## 2021-07-20 DIAGNOSIS — N390 Urinary tract infection, site not specified: Secondary | ICD-10-CM | POA: Diagnosis not present

## 2021-07-20 DIAGNOSIS — I499 Cardiac arrhythmia, unspecified: Secondary | ICD-10-CM | POA: Diagnosis not present

## 2021-07-20 DIAGNOSIS — Z743 Need for continuous supervision: Secondary | ICD-10-CM | POA: Diagnosis not present

## 2021-07-20 MED ORDER — CIPROFLOXACIN HCL 500 MG PO TABS: 500.0000 mg | ORAL_TABLET | Freq: Two times a day (BID) | ORAL | 0 refills | Status: DC

## 2021-07-20 MED ORDER — CIPROFLOXACIN HCL 250 MG PO TABS
500.0000 mg | ORAL_TABLET | Freq: Once | ORAL | Status: AC
  Administered 2021-07-20: 500 mg via ORAL
  Filled 2021-07-20: qty 2

## 2021-07-20 NOTE — ED Provider Notes (Signed)
? ?[facility] EMERGENCY DEPARTMENT  ?Provider Note ? ?[name]: [id number] ?Arrival date & time: 07/19/21 1924 ? ?History ?Chief Complaint  ?Patient presents with  ? Abdominal Pain  ? ? ?Melissa West is a 31 y.o. female with history of MS and chronic urinary retention previously had indwelling foley with frequent UTI, had a suprapubic catheter placed 6 days ago and was doing well but reports increased lower abdominal pain and decreased urine output at home today. She had a bladder scan in triage showing >700cc urine. Unfortunately her catheter bag/system does not have a way to flush. On my arrival to room there was a large amount of urine in her bag. She is feeling better.  ? ? ?Home Medications ?Prior to Admission medications   ?Medication Sig Start Date End Date Taking? Authorizing Provider  ?ciprofloxacin (CIPRO) 500 MG tablet Take 1 tablet (500 mg total) by mouth 2 (two) times daily. 07/20/21  Yes Pollyann Savoy, MD  ?acetaminophen (TYLENOL) 325 MG tablet Take 2 tablets (650 mg total) by mouth every 6 (six) hours as needed for mild pain (or Fever >/= 101). 05/20/21   Shon Hale, MD  ?albuterol (VENTOLIN HFA) 108 (90 Base) MCG/ACT inhaler Inhale 2 puffs into the lungs every 6 (six) hours as needed for wheezing or shortness of breath (cough). ?Patient not taking: Reported on 06/26/2021 06/20/21   Shon Hale, MD  ?baclofen (LIORESAL) 10 MG tablet Take 1 tablet (10 mg total) by mouth 2 (two) times daily. Take 1/2 to 1 pill po tid for Muscle spasm 06/20/21   Shon Hale, MD  ?cefpodoxime (VANTIN) 200 MG tablet Take 1 tablet (200 mg total) by mouth 2 (two) times daily. 07/03/21   Theron Arista, PA-C  ?ergocalciferol (VITAMIN D2) 1.25 MG (50000 UT) capsule Take 1 capsule (50,000 Units total) by mouth once a week. 06/20/21   Shon Hale, MD  ?KESIMPTA 20 MG/0.4ML SOAJ Inject 20 mg into the skin every 30 (thirty) days. 06/24/21   [provider]  ?lactobacillus (FLORANEX/LACTINEX) PACK Take 1  packet (1 g total) by mouth 3 (three) times daily with meals. ?Patient not taking: Reported on 05/29/2021 05/20/21   Shon Hale, MD  ?midodrine (PROAMATINE) 10 MG tablet Take 1 tablet (10 mg total) by mouth 3 (three) times daily with meals. To Prevent Low Blood Pressure ?Patient not taking: Reported on 06/26/2021 06/20/21   Shon Hale, MD  ?polyvinyl alcohol (LIQUIFILM TEARS) 1.4 % ophthalmic solution Place 1 drop into both eyes as needed for dry eyes. ?Patient not taking: Reported on 06/26/2021 06/20/21   Shon Hale, MD  ?senna-docusate (SENOKOT-S) 8.6-50 MG tablet Take 2 tablets by mouth at bedtime. ?Patient not taking: Reported on 06/26/2021 06/20/21 06/20/22  Shon Hale, MD  ?traZODone (DESYREL) 50 MG tablet Take 1 tablet (50 mg total) by mouth at bedtime as needed for sleep. ?Patient not taking: Reported on 06/26/2021 06/20/21   Shon Hale, MD  ? ? ? ?Allergies    ?Patient has no known allergies. ? ? ?Review of Systems   ?Review of Systems ?Please see HPI for pertinent positives and negatives ? ?Physical Exam ?BP 97/74   Pulse (!) 106   Temp 98.2 ?F (36.8 ?C) (Oral)   Resp 18   Ht 5\' 3"  (1.6 m)   Wt 73 kg   SpO2 99%   BMI 28.51 kg/m?  ? ?Physical Exam ?Vitals and nursing note reviewed.  ?Constitutional:   ?   Appearance: Normal appearance.  ?HENT:  ?   Head: Normocephalic  and atraumatic.  ?   Nose: Nose normal.  ?   Mouth/Throat:  ?   Mouth: Mucous membranes are moist.  ?Eyes:  ?   Extraocular Movements: Extraocular movements intact.  ?   Conjunctiva/sclera: Conjunctivae normal.  ?Cardiovascular:  ?   Rate and Rhythm: Normal rate.  ?Pulmonary:  ?   Effort: Pulmonary effort is normal.  ?   Breath sounds: Normal breath sounds.  ?Abdominal:  ?   General: Abdomen is flat.  ?   Palpations: Abdomen is soft.  ?   Tenderness: There is no abdominal tenderness.  ?   Comments: ? Enlarged bladder on exam  ?Musculoskeletal:     ?   General: No swelling. Normal range of motion.  ?   Cervical back:  Neck supple.  ?Skin: ?   General: Skin is warm and dry.  ?Neurological:  ?   General: No focal deficit present.  ?   Mental Status: She is alert.  ?Psychiatric:     ?   Mood and Affect: Mood normal.  ? ? ?ED Results / Procedures / Treatments   ?EKG ?None ? ?Procedures ?Procedures ? ?Medications Ordered in the ED ?Medications  ?ciprofloxacin (CIPRO) tablet 500 mg (has no administration in time range)  ? ? ?Initial Impression and Plan ? Patient with urinary retention after recent suprapubic catheter placement. Possibly still an enlarged bladder on exam, will ask RN to repeat bladder scan now that she appears to have drained into her bag. UA is concerning for UTI given this change in her urine symptoms. Previous cultures positive for Proteus, sensitive to Cipro  ? ?ED Course  ? ?Clinical Course as of 07/20/21 0150  ?Mon Jul 20, 2021  ?0133 CBC and CMP are unremarkable.  [CS]  ?0148 Repeat bladder scan is <100cc.Will give a dose of Cipro and discharge home with Rx for same. Urology follow up. [CS]  ?  ?Clinical Course User Index ?[CS] Pollyann Savoy, MD  ? ? ? ?MDM Rules/Calculators/A&P ?Medical Decision Making ?Problems Addressed: ?Lower urinary tract infectious disease: acute illness or injury ?Suprapubic catheter dysfunction, initial encounter [facility]): acute illness or injury ? ?Amount and/or Complexity of Data Reviewed ?Labs: ordered. ? ?Risk ?Prescription drug management. ? ? ? ?Final Clinical Impression(s) / ED Diagnoses ?Final diagnoses:  ?Lower urinary tract infectious disease  ?Suprapubic catheter dysfunction, initial encounter [facility])  ? ? ?Rx / DC Orders ?ED Discharge Orders   ? ?      Ordered  ?  ciprofloxacin (CIPRO) 500 MG tablet  2 times daily       ? 07/20/21 0150  ? ?  ?  ? ?  ? ?  ?Pollyann Savoy, MD ?07/20/21 0150 ? ?

## 2021-07-20 NOTE — ED Notes (Signed)
Pt discharged home with ems.  ?

## 2021-07-20 NOTE — ED Notes (Signed)
[employer] notified of patient needing transportation home. °

## 2021-07-21 ENCOUNTER — Other Ambulatory Visit (HOSPITAL_COMMUNITY): Payer: Self-pay | Admitting: Diagnostic Radiology

## 2021-07-21 DIAGNOSIS — G35 Multiple sclerosis: Secondary | ICD-10-CM | POA: Diagnosis not present

## 2021-07-21 DIAGNOSIS — R339 Retention of urine, unspecified: Secondary | ICD-10-CM

## 2021-07-22 ENCOUNTER — Other Ambulatory Visit (HOSPITAL_COMMUNITY): Payer: Self-pay | Admitting: Physician Assistant

## 2021-07-22 ENCOUNTER — Ambulatory Visit (HOSPITAL_COMMUNITY)
Admission: RE | Admit: 2021-07-22 | Discharge: 2021-07-22 | Disposition: A | Discharge status: Home / Self Care | Payer: Medicare Other | Source: Ambulatory Visit | Attending: Diagnostic Radiology | Admitting: Diagnostic Radiology

## 2021-07-22 DIAGNOSIS — R7881 Bacteremia: Secondary | ICD-10-CM | POA: Diagnosis not present

## 2021-07-22 DIAGNOSIS — Z435 Encounter for attention to cystostomy: Secondary | ICD-10-CM | POA: Insufficient documentation

## 2021-07-22 DIAGNOSIS — T83092A Other mechanical complication of nephrostomy catheter, initial encounter: Secondary | ICD-10-CM | POA: Diagnosis not present

## 2021-07-22 DIAGNOSIS — G35 Multiple sclerosis: Secondary | ICD-10-CM | POA: Diagnosis not present

## 2021-07-22 DIAGNOSIS — Z993 Dependence on wheelchair: Secondary | ICD-10-CM | POA: Diagnosis not present

## 2021-07-22 DIAGNOSIS — N390 Urinary tract infection, site not specified: Secondary | ICD-10-CM | POA: Diagnosis not present

## 2021-07-22 DIAGNOSIS — N179 Acute kidney failure, unspecified: Secondary | ICD-10-CM | POA: Diagnosis not present

## 2021-07-22 DIAGNOSIS — I959 Hypotension, unspecified: Secondary | ICD-10-CM | POA: Diagnosis not present

## 2021-07-22 DIAGNOSIS — D649 Anemia, unspecified: Secondary | ICD-10-CM | POA: Diagnosis not present

## 2021-07-22 DIAGNOSIS — R339 Retention of urine, unspecified: Secondary | ICD-10-CM

## 2021-07-22 DIAGNOSIS — Z466 Encounter for fitting and adjustment of urinary device: Secondary | ICD-10-CM | POA: Diagnosis not present

## 2021-07-22 DIAGNOSIS — A419 Sepsis, unspecified organism: Secondary | ICD-10-CM | POA: Diagnosis not present

## 2021-07-22 HISTORY — PX: IR CATHETER TUBE CHANGE: IMG717

## 2021-07-22 MED ORDER — IOHEXOL 300 MG/ML  SOLN
100.0000 mL | Freq: Once | INTRAMUSCULAR | Status: AC | PRN
  Administered 2021-07-22: 15 mL

## 2021-07-22 MED ORDER — LIDOCAINE HCL (PF) 1 % IJ SOLN
INTRAMUSCULAR | Status: DC | PRN
  Administered 2021-07-22: 5 mL

## 2021-07-22 MED ORDER — LIDOCAINE HCL 1 % IJ SOLN
INTRAMUSCULAR | Status: AC
  Filled 2021-07-22: qty 20

## 2021-07-23 DIAGNOSIS — R7881 Bacteremia: Secondary | ICD-10-CM | POA: Diagnosis not present

## 2021-07-23 DIAGNOSIS — I959 Hypotension, unspecified: Secondary | ICD-10-CM | POA: Diagnosis not present

## 2021-07-23 DIAGNOSIS — A419 Sepsis, unspecified organism: Secondary | ICD-10-CM | POA: Diagnosis not present

## 2021-07-23 DIAGNOSIS — G35 Multiple sclerosis: Secondary | ICD-10-CM | POA: Diagnosis not present

## 2021-07-23 DIAGNOSIS — Z466 Encounter for fitting and adjustment of urinary device: Secondary | ICD-10-CM | POA: Diagnosis not present

## 2021-07-23 DIAGNOSIS — N179 Acute kidney failure, unspecified: Secondary | ICD-10-CM | POA: Diagnosis not present

## 2021-07-23 DIAGNOSIS — N390 Urinary tract infection, site not specified: Secondary | ICD-10-CM | POA: Diagnosis not present

## 2021-07-23 DIAGNOSIS — R339 Retention of urine, unspecified: Secondary | ICD-10-CM | POA: Diagnosis not present

## 2021-07-23 DIAGNOSIS — D649 Anemia, unspecified: Secondary | ICD-10-CM | POA: Diagnosis not present

## 2021-07-23 DIAGNOSIS — Z993 Dependence on wheelchair: Secondary | ICD-10-CM | POA: Diagnosis not present

## 2021-07-24 DIAGNOSIS — Z466 Encounter for fitting and adjustment of urinary device: Secondary | ICD-10-CM | POA: Diagnosis not present

## 2021-07-24 DIAGNOSIS — I959 Hypotension, unspecified: Secondary | ICD-10-CM | POA: Diagnosis not present

## 2021-07-24 DIAGNOSIS — R7881 Bacteremia: Secondary | ICD-10-CM | POA: Diagnosis not present

## 2021-07-24 DIAGNOSIS — R339 Retention of urine, unspecified: Secondary | ICD-10-CM | POA: Diagnosis not present

## 2021-07-24 DIAGNOSIS — Z993 Dependence on wheelchair: Secondary | ICD-10-CM | POA: Diagnosis not present

## 2021-07-24 DIAGNOSIS — N179 Acute kidney failure, unspecified: Secondary | ICD-10-CM | POA: Diagnosis not present

## 2021-07-24 DIAGNOSIS — A419 Sepsis, unspecified organism: Secondary | ICD-10-CM | POA: Diagnosis not present

## 2021-07-24 DIAGNOSIS — G35 Multiple sclerosis: Secondary | ICD-10-CM | POA: Diagnosis not present

## 2021-07-24 DIAGNOSIS — N390 Urinary tract infection, site not specified: Secondary | ICD-10-CM | POA: Diagnosis not present

## 2021-07-24 DIAGNOSIS — D649 Anemia, unspecified: Secondary | ICD-10-CM | POA: Diagnosis not present

## 2021-07-29 ENCOUNTER — Telehealth: Payer: Self-pay

## 2021-07-29 NOTE — Telephone Encounter (Signed)
Patient called and left voicemail today advising she needed a clinical staff member to giver her a call she had some medical questions. ? ? ?Thank you. ?

## 2021-07-30 ENCOUNTER — Telehealth: Payer: Self-pay

## 2021-07-30 NOTE — Telephone Encounter (Signed)
Patient called to ask how long until after having her SP tube placed will she have to wait to have intercourse.  Per Dr. Pete Glatter patient should wait at least a month until she has the tube upsized so there are not any issues with healing.  Instructed her it would be best to discuss at her next appointment.  Patient aware and voiced understanding.     ?

## 2021-07-30 NOTE — Telephone Encounter (Signed)
Returned call and mother stated she was not around patient at the time but will let her know we returned her call. ?

## 2021-08-01 DIAGNOSIS — Z435 Encounter for attention to cystostomy: Secondary | ICD-10-CM | POA: Diagnosis not present

## 2021-08-01 DIAGNOSIS — Z87440 Personal history of urinary (tract) infections: Secondary | ICD-10-CM | POA: Diagnosis not present

## 2021-08-01 DIAGNOSIS — D649 Anemia, unspecified: Secondary | ICD-10-CM | POA: Diagnosis not present

## 2021-08-01 DIAGNOSIS — Z466 Encounter for fitting and adjustment of urinary device: Secondary | ICD-10-CM | POA: Diagnosis not present

## 2021-08-01 DIAGNOSIS — G35 Multiple sclerosis: Secondary | ICD-10-CM | POA: Diagnosis not present

## 2021-08-01 DIAGNOSIS — N179 Acute kidney failure, unspecified: Secondary | ICD-10-CM | POA: Diagnosis not present

## 2021-08-01 DIAGNOSIS — Z993 Dependence on wheelchair: Secondary | ICD-10-CM | POA: Diagnosis not present

## 2021-08-01 DIAGNOSIS — R339 Retention of urine, unspecified: Secondary | ICD-10-CM | POA: Diagnosis not present

## 2021-08-01 DIAGNOSIS — I959 Hypotension, unspecified: Secondary | ICD-10-CM | POA: Diagnosis not present

## 2021-08-05 DIAGNOSIS — N179 Acute kidney failure, unspecified: Secondary | ICD-10-CM | POA: Diagnosis not present

## 2021-08-05 DIAGNOSIS — R339 Retention of urine, unspecified: Secondary | ICD-10-CM | POA: Diagnosis not present

## 2021-08-05 DIAGNOSIS — Z993 Dependence on wheelchair: Secondary | ICD-10-CM | POA: Diagnosis not present

## 2021-08-05 DIAGNOSIS — I959 Hypotension, unspecified: Secondary | ICD-10-CM | POA: Diagnosis not present

## 2021-08-05 DIAGNOSIS — G35 Multiple sclerosis: Secondary | ICD-10-CM | POA: Diagnosis not present

## 2021-08-05 DIAGNOSIS — Z87440 Personal history of urinary (tract) infections: Secondary | ICD-10-CM | POA: Diagnosis not present

## 2021-08-05 DIAGNOSIS — Z466 Encounter for fitting and adjustment of urinary device: Secondary | ICD-10-CM | POA: Diagnosis not present

## 2021-08-05 DIAGNOSIS — D649 Anemia, unspecified: Secondary | ICD-10-CM | POA: Diagnosis not present

## 2021-08-05 DIAGNOSIS — Z435 Encounter for attention to cystostomy: Secondary | ICD-10-CM | POA: Diagnosis not present

## 2021-08-11 DIAGNOSIS — N179 Acute kidney failure, unspecified: Secondary | ICD-10-CM | POA: Diagnosis not present

## 2021-08-11 DIAGNOSIS — Z466 Encounter for fitting and adjustment of urinary device: Secondary | ICD-10-CM | POA: Diagnosis not present

## 2021-08-11 DIAGNOSIS — Z435 Encounter for attention to cystostomy: Secondary | ICD-10-CM | POA: Diagnosis not present

## 2021-08-11 DIAGNOSIS — D649 Anemia, unspecified: Secondary | ICD-10-CM | POA: Diagnosis not present

## 2021-08-11 DIAGNOSIS — Z993 Dependence on wheelchair: Secondary | ICD-10-CM | POA: Diagnosis not present

## 2021-08-11 DIAGNOSIS — G35 Multiple sclerosis: Secondary | ICD-10-CM | POA: Diagnosis not present

## 2021-08-11 DIAGNOSIS — I959 Hypotension, unspecified: Secondary | ICD-10-CM | POA: Diagnosis not present

## 2021-08-11 DIAGNOSIS — R339 Retention of urine, unspecified: Secondary | ICD-10-CM | POA: Diagnosis not present

## 2021-08-11 DIAGNOSIS — Z87440 Personal history of urinary (tract) infections: Secondary | ICD-10-CM | POA: Diagnosis not present

## 2021-08-12 ENCOUNTER — Ambulatory Visit (INDEPENDENT_AMBULATORY_CARE_PROVIDER_SITE_OTHER): Payer: Medicare Other | Admitting: Urology

## 2021-08-12 VITALS — BP 112/72 | HR 114

## 2021-08-12 DIAGNOSIS — R339 Retention of urine, unspecified: Secondary | ICD-10-CM

## 2021-08-12 DIAGNOSIS — Z9350 Unspecified cystostomy status: Secondary | ICD-10-CM

## 2021-08-12 DIAGNOSIS — Z9889 Other specified postprocedural states: Secondary | ICD-10-CM

## 2021-08-12 NOTE — Patient Instructions (Signed)
Interventional Radiology ?[id number] ?

## 2021-08-12 NOTE — Progress Notes (Signed)
? ?08/12/2021 ?10:54 AM  ? ?Melissa West ?[birth year] ?[id number] ? ?Referring provider: Benita Stabile, MD ?[address] ?[place] ?[place],  [place] [address] ? ?Followup urinary retention ? ? ?HPI: ?Mr Melissa West is a 30yo here for followup for urinary retention. She is currently managed with an SP tube. The SP dressing has not been changed in several weeks. She currently does not have an appointment to upsize her SP tube.  ? ? ?PMH: ?Past Medical History:  ?Diagnosis Date  ? MS (multiple sclerosis) (HCC)   ? No pertinent past medical history   ? ? ?Surgical History: ?Past Surgical History:  ?Procedure Laterality Date  ? CESAREAN SECTION  08/09/2011  ? Procedure: CESAREAN SECTION;  Surgeon: Melissa Dukes, MD;  Location: [facility] ORS;  Service: Gynecology;  Laterality: N/A;  ? IR CATHETER TUBE CHANGE  07/22/2021  ? ? ?Home Medications:  ?Allergies as of 08/12/2021   ?No Known Allergies ?  ? ?  ?Medication List  ?  ? ?  ? Accurate as of Aug 12, 2021 10:54 AM. If you have any questions, ask your nurse or doctor.  ?  ?  ? ?  ? ?acetaminophen 325 MG tablet ?Commonly known as: TYLENOL ?Take 2 tablets (650 mg total) by mouth every 6 (six) hours as needed for mild pain (or Fever >/= 101). ?  ?albuterol 108 (90 Base) MCG/ACT inhaler ?Commonly known as: VENTOLIN HFA ?Inhale 2 puffs into the lungs every 6 (six) hours as needed for wheezing or shortness of breath (cough). ?  ?baclofen 10 MG tablet ?Commonly known as: LIORESAL ?Take 1 tablet (10 mg total) by mouth 2 (two) times daily. Take 1/2 to 1 pill po tid for Muscle spasm ?  ?cefpodoxime 200 MG tablet ?Commonly known as: VANTIN ?Take 1 tablet (200 mg total) by mouth 2 (two) times daily. ?  ?ciprofloxacin 500 MG tablet ?Commonly known as: CIPRO ?Take 1 tablet (500 mg total) by mouth 2 (two) times daily. ?  ?ergocalciferol 1.25 MG (50000 UT) capsule ?Commonly known as: VITAMIN D2 ?Take 1 capsule (50,000 Units total) by mouth once a week. ?  ?Kesimpta 20 MG/0.4ML Soaj ?Generic drug:  Ofatumumab ?Inject 20 mg into the skin every 30 (thirty) days. ?  ?lactobacillus Pack ?Take 1 packet (1 g total) by mouth 3 (three) times daily with meals. ?  ?midodrine 10 MG tablet ?Commonly known as: PROAMATINE ?Take 1 tablet (10 mg total) by mouth 3 (three) times daily with meals. To Prevent Low Blood Pressure ?  ?polyvinyl alcohol 1.4 % ophthalmic solution ?Commonly known as: LIQUIFILM TEARS ?Place 1 drop into both eyes as needed for dry eyes. ?  ?senna-docusate 8.6-50 MG tablet ?Commonly known as: Senokot-S ?Take 2 tablets by mouth at bedtime. ?  ?traZODone 50 MG tablet ?Commonly known as: DESYREL ?Take 1 tablet (50 mg total) by mouth at bedtime as needed for sleep. ?  ? ?  ? ? ?Allergies: No Known Allergies ? ?Family History: ?Family History  ?Problem Relation Age of Onset  ? Diabetes Maternal Grandmother   ? Hypertension Maternal Grandmother   ? Kidney disease Maternal Grandmother   ? ? ?Social History:  reports that she has never smoked. She has never used smokeless tobacco. She reports that she does not drink alcohol and does not use drugs. ? ?ROS: ?All other review of systems were reviewed and are negative except what is noted above in HPI ? ?Physical Exam: ?BP 112/72   Pulse (!) 114   ?Constitutional:  Alert and oriented, No acute distress. ?HEENT: [place] AT, moist mucus membranes.  Trachea midline, no masses. ?Cardiovascular: No clubbing, cyanosis, or edema. ?Respiratory: Normal respiratory effort, no increased work of breathing. ?GI: Abdomen is soft, nontender, nondistended, no abdominal masses ?GU: No CVA tenderness.  ?Lymph: No cervical or inguinal lymphadenopathy. ?Skin: No rashes, bruises or suspicious lesions. ?Neurologic: Grossly intact, no focal deficits, moving all 4 extremities. ?Psychiatric: Normal mood and affect. ? ?Laboratory Data: ?Lab Results  ?Component Value Date  ? WBC 7.5 07/19/2021  ? HGB 13.5 07/19/2021  ? HCT 40.3 07/19/2021  ? MCV 84.1 07/19/2021  ? PLT 255 07/19/2021  ? ? ?Lab  Results  ?Component Value Date  ? CREATININE 1.17 (H) 07/19/2021  ? ? ?No results found for: PSA ? ?No results found for: TESTOSTERONE ? ?No results found for: HGBA1C ? ?Urinalysis ?   ?Component Value Date/Time  ? COLORURINE YELLOW 07/19/2021 2000  ? APPEARANCEUR TURBID (A) 07/19/2021 2000  ? LABSPEC 1.014 07/19/2021 2000  ? PHURINE 8.0 07/19/2021 2000  ? GLUCOSEU NEGATIVE 07/19/2021 2000  ? HGBUR SMALL (A) 07/19/2021 2000  ? BILIRUBINUR NEGATIVE 07/19/2021 2000  ? KETONESUR NEGATIVE 07/19/2021 2000  ? PROTEINUR >=300 (A) 07/19/2021 2000  ? UROBILINOGEN 0.2 06/13/2009 1128  ? NITRITE POSITIVE (A) 07/19/2021 2000  ? LEUKOCYTESUR MODERATE (A) 07/19/2021 2000  ? ? ?Lab Results  ?Component Value Date  ? BACTERIA NONE SEEN 07/19/2021  ? ? ?Pertinent Imaging: ? ?No results found for this or any previous visit. ? ?No results found for this or any previous visit. ? ?No results found for this or any previous visit. ? ?No results found for this or any previous visit. ? ?No results found for this or any previous visit. ? ?No results found for this or any previous visit. ? ?No results found for this or any previous visit. ? ?Results for orders placed during the hospital encounter of 05/14/21 ? ?CT RENAL STONE STUDY ? ?Narrative ?CLINICAL DATA:  Sepsis, urinary retention, indwelling Foley ?catheter, generalized weakness and chills ? ?EXAM: ?CT ABDOMEN AND PELVIS WITHOUT CONTRAST ? ?TECHNIQUE: ?Multidetector CT imaging of the abdomen and pelvis was performed ?following the standard protocol without IV contrast. ? ?RADIATION DOSE REDUCTION: This exam was performed according to the ?departmental dose-optimization program which includes automated ?exposure control, adjustment of the mA and/or kV according to ?patient size and/or use of iterative reconstruction technique. ? ?COMPARISON:  03/07/2021 ? ?FINDINGS: ?Lower chest: No acute pleural or parenchymal lung disease. ? ?Hepatobiliary: Calcified gallstones without evidence  of ?cholecystitis. Unremarkable unenhanced appearance of the liver. ? ?Pancreas: Unremarkable unenhanced appearance. ? ?Spleen: Unremarkable unenhanced appearance. ? ?Adrenals/Urinary Tract: Bladder is decompressed with a Foley ?catheter. No urinary tract calculi or obstructive uropathy within ?either kidney. Evaluation of the renal parenchyma is limited without ?IV contrast. The adrenals are stable. ? ?Stomach/Bowel: No bowel obstruction or ileus. Normal appendix right ?lower quadrant. No bowel wall thickening or inflammatory change. ? ?Vascular/Lymphatic: No significant vascular findings are present. No ?enlarged abdominal or pelvic lymph nodes. ? ?Reproductive: Uterus and bilateral adnexa are unremarkable. ? ?Other: Trace free fluid in the pelvis may be physiologic. No free ?intraperitoneal gas. No abdominal wall hernia. ? ?Musculoskeletal: No acute or destructive bony lesions. Reconstructed ?images demonstrate no additional findings. ? ?IMPRESSION: ?1. Cholelithiasis without cholecystitis. ?2. No evidence of urinary tract calculi or obstructive uropathy. ?Indwelling Foley catheter decompresses the bladder. ?3. Trace pelvic free fluid, likely physiologic. ? ? ?Electronically Signed ?By: Sharlet Salina M.D. ?On:  05/14/2021 17:14 ? ? ?Assessment & Plan:   ? ?1. History of suprapubic catheter ?-Patient to be schedule for SP tube replacement ? ?2. Urinary retention ?COntinue SP tube ? ? ?No follow-ups on file. ? ?Wilkie Aye, MD ? ?[facility]  ?  ?

## 2021-08-19 DIAGNOSIS — G35 Multiple sclerosis: Secondary | ICD-10-CM | POA: Diagnosis not present

## 2021-08-19 DIAGNOSIS — N179 Acute kidney failure, unspecified: Secondary | ICD-10-CM | POA: Diagnosis not present

## 2021-08-19 DIAGNOSIS — R339 Retention of urine, unspecified: Secondary | ICD-10-CM | POA: Diagnosis not present

## 2021-08-19 DIAGNOSIS — I959 Hypotension, unspecified: Secondary | ICD-10-CM | POA: Diagnosis not present

## 2021-08-19 DIAGNOSIS — D649 Anemia, unspecified: Secondary | ICD-10-CM | POA: Diagnosis not present

## 2021-08-19 DIAGNOSIS — Z87440 Personal history of urinary (tract) infections: Secondary | ICD-10-CM | POA: Diagnosis not present

## 2021-08-19 DIAGNOSIS — Z993 Dependence on wheelchair: Secondary | ICD-10-CM | POA: Diagnosis not present

## 2021-08-19 DIAGNOSIS — Z435 Encounter for attention to cystostomy: Secondary | ICD-10-CM | POA: Diagnosis not present

## 2021-08-19 DIAGNOSIS — Z466 Encounter for fitting and adjustment of urinary device: Secondary | ICD-10-CM | POA: Diagnosis not present

## 2021-08-20 DIAGNOSIS — R339 Retention of urine, unspecified: Secondary | ICD-10-CM | POA: Diagnosis not present

## 2021-08-20 DIAGNOSIS — Z87440 Personal history of urinary (tract) infections: Secondary | ICD-10-CM | POA: Diagnosis not present

## 2021-08-20 DIAGNOSIS — N179 Acute kidney failure, unspecified: Secondary | ICD-10-CM | POA: Diagnosis not present

## 2021-08-20 DIAGNOSIS — Z993 Dependence on wheelchair: Secondary | ICD-10-CM | POA: Diagnosis not present

## 2021-08-20 DIAGNOSIS — G35 Multiple sclerosis: Secondary | ICD-10-CM | POA: Diagnosis not present

## 2021-08-20 DIAGNOSIS — I959 Hypotension, unspecified: Secondary | ICD-10-CM | POA: Diagnosis not present

## 2021-08-20 DIAGNOSIS — Z435 Encounter for attention to cystostomy: Secondary | ICD-10-CM | POA: Diagnosis not present

## 2021-08-20 DIAGNOSIS — D649 Anemia, unspecified: Secondary | ICD-10-CM | POA: Diagnosis not present

## 2021-08-20 DIAGNOSIS — Z466 Encounter for fitting and adjustment of urinary device: Secondary | ICD-10-CM | POA: Diagnosis not present

## 2021-08-23 DIAGNOSIS — G35 Multiple sclerosis: Secondary | ICD-10-CM | POA: Diagnosis not present

## 2021-08-24 ENCOUNTER — Encounter: Payer: Self-pay | Admitting: Urology

## 2021-08-25 ENCOUNTER — Ambulatory Visit (HOSPITAL_COMMUNITY)
Admission: RE | Admit: 2021-08-25 | Discharge: 2021-08-25 | Disposition: A | Discharge status: Home / Self Care | Payer: Medicare Other | Source: Ambulatory Visit | Attending: Physician Assistant | Admitting: Physician Assistant

## 2021-08-25 ENCOUNTER — Encounter (HOSPITAL_COMMUNITY): Payer: Self-pay

## 2021-08-25 DIAGNOSIS — Z7689 Persons encountering health services in other specified circumstances: Secondary | ICD-10-CM | POA: Diagnosis not present

## 2021-08-25 DIAGNOSIS — R339 Retention of urine, unspecified: Secondary | ICD-10-CM

## 2021-08-26 DIAGNOSIS — Z466 Encounter for fitting and adjustment of urinary device: Secondary | ICD-10-CM | POA: Diagnosis not present

## 2021-08-26 DIAGNOSIS — Z87440 Personal history of urinary (tract) infections: Secondary | ICD-10-CM | POA: Diagnosis not present

## 2021-08-26 DIAGNOSIS — D649 Anemia, unspecified: Secondary | ICD-10-CM | POA: Diagnosis not present

## 2021-08-26 DIAGNOSIS — R339 Retention of urine, unspecified: Secondary | ICD-10-CM | POA: Diagnosis not present

## 2021-08-26 DIAGNOSIS — I959 Hypotension, unspecified: Secondary | ICD-10-CM | POA: Diagnosis not present

## 2021-08-26 DIAGNOSIS — G35 Multiple sclerosis: Secondary | ICD-10-CM | POA: Diagnosis not present

## 2021-08-26 DIAGNOSIS — Z435 Encounter for attention to cystostomy: Secondary | ICD-10-CM | POA: Diagnosis not present

## 2021-08-26 DIAGNOSIS — Z993 Dependence on wheelchair: Secondary | ICD-10-CM | POA: Diagnosis not present

## 2021-08-26 DIAGNOSIS — N179 Acute kidney failure, unspecified: Secondary | ICD-10-CM | POA: Diagnosis not present

## 2021-08-27 DIAGNOSIS — N179 Acute kidney failure, unspecified: Secondary | ICD-10-CM | POA: Diagnosis not present

## 2021-08-27 DIAGNOSIS — Z993 Dependence on wheelchair: Secondary | ICD-10-CM | POA: Diagnosis not present

## 2021-08-27 DIAGNOSIS — Z87440 Personal history of urinary (tract) infections: Secondary | ICD-10-CM | POA: Diagnosis not present

## 2021-08-27 DIAGNOSIS — Z466 Encounter for fitting and adjustment of urinary device: Secondary | ICD-10-CM | POA: Diagnosis not present

## 2021-08-27 DIAGNOSIS — R339 Retention of urine, unspecified: Secondary | ICD-10-CM | POA: Diagnosis not present

## 2021-08-27 DIAGNOSIS — Z435 Encounter for attention to cystostomy: Secondary | ICD-10-CM | POA: Diagnosis not present

## 2021-08-27 DIAGNOSIS — D649 Anemia, unspecified: Secondary | ICD-10-CM | POA: Diagnosis not present

## 2021-08-27 DIAGNOSIS — I959 Hypotension, unspecified: Secondary | ICD-10-CM | POA: Diagnosis not present

## 2021-08-27 DIAGNOSIS — G35 Multiple sclerosis: Secondary | ICD-10-CM | POA: Diagnosis not present

## 2021-09-01 DIAGNOSIS — D649 Anemia, unspecified: Secondary | ICD-10-CM | POA: Diagnosis not present

## 2021-09-01 DIAGNOSIS — Z87440 Personal history of urinary (tract) infections: Secondary | ICD-10-CM | POA: Diagnosis not present

## 2021-09-01 DIAGNOSIS — N179 Acute kidney failure, unspecified: Secondary | ICD-10-CM | POA: Diagnosis not present

## 2021-09-01 DIAGNOSIS — Z466 Encounter for fitting and adjustment of urinary device: Secondary | ICD-10-CM | POA: Diagnosis not present

## 2021-09-01 DIAGNOSIS — Z993 Dependence on wheelchair: Secondary | ICD-10-CM | POA: Diagnosis not present

## 2021-09-01 DIAGNOSIS — Z435 Encounter for attention to cystostomy: Secondary | ICD-10-CM | POA: Diagnosis not present

## 2021-09-01 DIAGNOSIS — G35 Multiple sclerosis: Secondary | ICD-10-CM | POA: Diagnosis not present

## 2021-09-01 DIAGNOSIS — I959 Hypotension, unspecified: Secondary | ICD-10-CM | POA: Diagnosis not present

## 2021-09-01 DIAGNOSIS — R339 Retention of urine, unspecified: Secondary | ICD-10-CM | POA: Diagnosis not present

## 2021-09-04 ENCOUNTER — Emergency Department (HOSPITAL_COMMUNITY)
Admission: EM | Admit: 2021-09-04 | Discharge: 2021-09-04 | Disposition: A | Discharge status: Home / Self Care | Payer: Medicare Other | Attending: Emergency Medicine | Admitting: Emergency Medicine

## 2021-09-04 ENCOUNTER — Other Ambulatory Visit: Payer: Self-pay

## 2021-09-04 ENCOUNTER — Encounter (HOSPITAL_COMMUNITY): Payer: Self-pay | Admitting: Emergency Medicine

## 2021-09-04 DIAGNOSIS — Z466 Encounter for fitting and adjustment of urinary device: Secondary | ICD-10-CM | POA: Diagnosis not present

## 2021-09-04 DIAGNOSIS — R58 Hemorrhage, not elsewhere classified: Secondary | ICD-10-CM | POA: Diagnosis not present

## 2021-09-04 DIAGNOSIS — Z743 Need for continuous supervision: Secondary | ICD-10-CM | POA: Diagnosis not present

## 2021-09-04 DIAGNOSIS — R279 Unspecified lack of coordination: Secondary | ICD-10-CM | POA: Diagnosis not present

## 2021-09-04 DIAGNOSIS — R52 Pain, unspecified: Secondary | ICD-10-CM | POA: Diagnosis not present

## 2021-09-04 DIAGNOSIS — T83098A Other mechanical complication of other indwelling urethral catheter, initial encounter: Secondary | ICD-10-CM | POA: Diagnosis not present

## 2021-09-04 DIAGNOSIS — Z7401 Bed confinement status: Secondary | ICD-10-CM | POA: Diagnosis not present

## 2021-09-04 DIAGNOSIS — R339 Retention of urine, unspecified: Secondary | ICD-10-CM | POA: Diagnosis not present

## 2021-09-04 DIAGNOSIS — Z435 Encounter for attention to cystostomy: Secondary | ICD-10-CM

## 2021-09-04 DIAGNOSIS — R531 Weakness: Secondary | ICD-10-CM | POA: Diagnosis not present

## 2021-09-04 NOTE — ED Provider Notes (Signed)
[facility] EMERGENCY DEPARTMENT Provider Note   [name]: [id number] Arrival date & time: 09/04/21  1015     History  Chief Complaint  Patient presents with   Foley Problem    Suprapubic Catheter bleeding and painful    Melissa West is a 31 y.o. female.  HPI  Patient with medical history of MS and chronic urinary retention presents today due to suprapubic catheter issue.  She was seen last by Dr. Alyson Ingles at urology on 08/12/2021.  She states the catheter is working normally and draining normally with normal output.  Denies noticing any hematuria.  She states yesterday her aide "moved her" and she started having pain at the Foley site.  The pain was constant, it improved after this morning.  She currently has no pain, there is no bleeding along the site.  She denies any fevers, abdominal pain, vomiting.  Home Medications Prior to Admission medications   Medication Sig Start Date End Date Taking? Authorizing Provider  baclofen (LIORESAL) 10 MG tablet Take 1 tablet (10 mg total) by mouth 2 (two) times daily. Take 1/2 to 1 pill po tid for Muscle spasm 06/20/21  Yes Emokpae, Courage, MD  ergocalciferol (VITAMIN D2) 1.25 MG (50000 UT) capsule Take 1 capsule (50,000 Units total) by mouth once a week. 06/20/21  Yes Emokpae, Courage, MD  KESIMPTA 20 MG/0.4ML SOAJ Inject 20 mg into the skin every 30 (thirty) days. 06/24/21  Yes [provider]  acetaminophen (TYLENOL) 325 MG tablet Take 2 tablets (650 mg total) by mouth every 6 (six) hours as needed for mild pain (or Fever >/= 101). Patient not taking: Reported on 08/12/2021 05/20/21   Roxan Hockey, MD  albuterol (VENTOLIN HFA) 108 (90 Base) MCG/ACT inhaler Inhale 2 puffs into the lungs every 6 (six) hours as needed for wheezing or shortness of breath (cough). Patient not taking: Reported on 06/26/2021 06/20/21   Roxan Hockey, MD  cefpodoxime (VANTIN) 200 MG tablet Take 1 tablet (200 mg total) by mouth 2 (two) times daily. Patient  not taking: Reported on 08/12/2021 07/03/21   Sherrill Raring, PA-C  ciprofloxacin (CIPRO) 500 MG tablet Take 1 tablet (500 mg total) by mouth 2 (two) times daily. Patient not taking: Reported on 08/12/2021 07/20/21   Truddie Hidden, MD  lactobacillus (FLORANEX/LACTINEX) PACK Take 1 packet (1 g total) by mouth 3 (three) times daily with meals. Patient not taking: Reported on 05/29/2021 05/20/21   Roxan Hockey, MD  midodrine (PROAMATINE) 10 MG tablet Take 1 tablet (10 mg total) by mouth 3 (three) times daily with meals. To Prevent Low Blood Pressure Patient not taking: Reported on 06/26/2021 06/20/21   Roxan Hockey, MD  polyvinyl alcohol (LIQUIFILM TEARS) 1.4 % ophthalmic solution Place 1 drop into both eyes as needed for dry eyes. Patient not taking: Reported on 06/26/2021 06/20/21   Roxan Hockey, MD  senna-docusate (SENOKOT-S) 8.6-50 MG tablet Take 2 tablets by mouth at bedtime. Patient not taking: Reported on 06/26/2021 06/20/21 06/20/22  Roxan Hockey, MD  traZODone (DESYREL) 50 MG tablet Take 1 tablet (50 mg total) by mouth at bedtime as needed for sleep. Patient not taking: Reported on 06/26/2021 06/20/21   Roxan Hockey, MD      Allergies    Patient has no known allergies.    Review of Systems   Review of Systems  Physical Exam Updated Vital Signs BP 101/72 (BP Location: Right Arm)   Pulse (!) 59   Temp 98.5 F (36.9 C) (Oral)   Resp 16  Ht 5\' 3"  (1.6 m)   Wt 73 kg   LMP 08/10/2021 (Approximate)   SpO2 94%   BMI 28.51 kg/m  Physical Exam Vitals and nursing note reviewed. Exam conducted with a chaperone present.  Constitutional:      General: She is not in acute distress.    Appearance: Normal appearance.  HENT:     Head: Normocephalic and atraumatic.  Eyes:     General: No scleral icterus.    Extraocular Movements: Extraocular movements intact.     Pupils: Pupils are equal, round, and reactive to light.  Cardiovascular:     Rate and Rhythm: Normal rate and regular  rhythm.     Pulses: Normal pulses.  Pulmonary:     Effort: Pulmonary effort is normal.     Breath sounds: Normal breath sounds.  Abdominal:     General: Abdomen is flat.     Tenderness: There is no abdominal tenderness.     Comments: SP catheter in place.  Actively draining, Foley bag with clear urine without hematuria.  There is no surrounding erythema or warmth to the Foley site.  Abdomen is soft nontender.  Skin:    Coloration: Skin is not jaundiced.  Neurological:     Mental Status: She is alert. Mental status is at baseline.     Coordination: Coordination normal.    ED Results / Procedures / Treatments   Labs (all labs ordered are listed, but only abnormal results are displayed) Labs Reviewed - No data to display  EKG None  Radiology No results found.  Procedures Procedures    Medications Ordered in ED Medications - No data to display  ED Course/ Medical Decision Making/ A&P                           Medical Decision Making  Patient is a 31 year old female with history of MS and chronic urinary retention presenting today due to SP catheter pain.  She is followed by Dr. Alyson Ingles with urology, per the last visit she has an appointment to [facility] the catheter.  She started having pain last night when an aide turned her wrong, there is no issue with catheter output.  There is no blood in the urine nor surrounding the Foley site.  She is having normal output, initially had some tenderness where the Foley inserts but she is symptom-free upon my examination.  She states the pain resolved when she arrived to the ED.  She has no systemic symptoms, her vital signs are stable and she does not meet SIRS criteria.  She is not having any dysuria, given chronic indwelling site do not feel we need to check urine.  I considered labs but given she is pain-free, there is normal output and no systemic symptoms I do not think would be particularly beneficial at this point.  Will discharge back  to home and have her return if pain returns, there is issues with the Foley function or if she has new systemic symptoms.  Otherwise follow-up with urology as needed.        Final Clinical Impression(s) / ED Diagnoses Final diagnoses:  None    Rx / DC Orders ED Discharge Orders     None         Sherrill Raring, PA-C 09/04/21 [place], [place], MD 09/06/21 [phone]

## 2021-09-04 NOTE — Discharge Instructions (Signed)
Your catheter appears to be functioning well.  I do not see any blood in your urine or surrounding the Foley insertion.  Please follow-up with your urologist as needed, if you have returning pain, fevers, blood in the urine, decreased output from the catheter return back to the ED for evaluation.

## 2021-09-04 NOTE — ED Triage Notes (Signed)
Pt was being turned last night to be changed and suprapubic catheter currently bleeding and painful.

## 2021-09-09 ENCOUNTER — Other Ambulatory Visit (HOSPITAL_COMMUNITY): Payer: Self-pay | Admitting: Physician Assistant

## 2021-09-10 ENCOUNTER — Ambulatory Visit (HOSPITAL_COMMUNITY)
Admission: RE | Admit: 2021-09-10 | Discharge: 2021-09-10 | Disposition: A | Discharge status: Home / Self Care | Payer: Medicare Other | Source: Ambulatory Visit | Attending: Physician Assistant | Admitting: Physician Assistant

## 2021-09-10 ENCOUNTER — Other Ambulatory Visit: Payer: Self-pay

## 2021-09-10 ENCOUNTER — Encounter (HOSPITAL_COMMUNITY): Payer: Self-pay

## 2021-09-10 DIAGNOSIS — Z435 Encounter for attention to cystostomy: Secondary | ICD-10-CM | POA: Diagnosis not present

## 2021-09-10 DIAGNOSIS — R339 Retention of urine, unspecified: Secondary | ICD-10-CM | POA: Diagnosis not present

## 2021-09-10 DIAGNOSIS — G35 Multiple sclerosis: Secondary | ICD-10-CM | POA: Diagnosis not present

## 2021-09-10 DIAGNOSIS — T83028A Displacement of other indwelling urethral catheter, initial encounter: Secondary | ICD-10-CM | POA: Diagnosis not present

## 2021-09-10 HISTORY — PX: IR CATHETER TUBE CHANGE: IMG717

## 2021-09-10 MED ORDER — SODIUM CHLORIDE 0.9% FLUSH: 5.0000 mL | Freq: Three times a day (TID) | INTRAVENOUS | Status: DC

## 2021-09-10 MED ORDER — SODIUM CHLORIDE 0.9 % IV SOLN: INTRAVENOUS | Status: DC

## 2021-09-10 MED ORDER — LIDOCAINE HCL 1 % IJ SOLN
INTRAMUSCULAR | Status: AC
  Administered 2021-09-10: 10 mL
  Filled 2021-09-10: qty 20

## 2021-09-10 MED ORDER — IOHEXOL 300 MG/ML  SOLN
100.0000 mL | Freq: Once | INTRAMUSCULAR | Status: AC | PRN
  Administered 2021-09-10: 15 mL

## 2021-09-10 MED ORDER — FENTANYL CITRATE (PF) 100 MCG/2ML IJ SOLN
INTRAMUSCULAR | Status: AC
  Filled 2021-09-10: qty 2

## 2021-09-10 MED ORDER — MIDAZOLAM HCL 2 MG/2ML IJ SOLN
INTRAMUSCULAR | Status: AC | PRN
  Administered 2021-09-10 (×2): .5 mg via INTRAVENOUS

## 2021-09-10 MED ORDER — MIDAZOLAM HCL 2 MG/2ML IJ SOLN
INTRAMUSCULAR | Status: AC
  Filled 2021-09-10: qty 2

## 2021-09-10 MED ORDER — FENTANYL CITRATE (PF) 100 MCG/2ML IJ SOLN
INTRAMUSCULAR | Status: AC | PRN
  Administered 2021-09-10: 50 ug via INTRAVENOUS

## 2021-09-10 NOTE — Procedures (Signed)
  Procedure:  Exchange suprapubic catheter 1f Foley Preprocedure diagnosis: Neurogenic bladder Postprocedure diagnosis: same EBL:    minimal Complications:   none immediate  See full dictation in [employer].  Thora Lance MD Main # [phone] Pager  [phone] Mobile [phone]

## 2021-09-10 NOTE — H&P (Signed)
Chief Complaint: Patient was seen in consultation today for suprapubic catheter upsize  at the request of Abundio Miu  Referring Physician(s): Roanna Banning, MD  Supervising Physician: Oley Balm  Patient Status: Melissa West - Out-pt  History of Present Illness: Melissa West is a 31 y.o. female with PMH significant for progressive MS and urinary retention. Pt had 28F suprapubic catheter placed 07/14/21 with Dr. Milford Cage. Pt returns today for SP catheter upsize.   Past Medical History:  Diagnosis Date   MS (multiple sclerosis) (HCC)    No pertinent past medical history     Past Surgical History:  Procedure Laterality Date   CESAREAN SECTION  08/09/2011   Procedure: CESAREAN SECTION;  Surgeon: Lesly Dukes, MD;  Location: [facility];  Service: Gynecology;  Laterality: N/A;   IR CATHETER TUBE CHANGE  07/22/2021    Allergies: Patient has no known allergies.  Medications: Prior to Admission medications   Medication Sig Start Date End Date Taking? Authorizing Provider  baclofen (LIORESAL) 10 MG tablet Take 1 tablet (10 mg total) by mouth 2 (two) times daily. Take 1/2 to 1 pill po tid for Muscle spasm 06/20/21  Yes Emokpae, Courage, MD  ergocalciferol (VITAMIN D2) 1.25 MG (50000 UT) capsule Take 1 capsule (50,000 Units total) by mouth once a week. 06/20/21  Yes Emokpae, Courage, MD  KESIMPTA 20 MG/0.4ML SOAJ Inject 20 mg into the skin every 30 (thirty) days. 06/24/21  Yes [provider]  acetaminophen (TYLENOL) 325 MG tablet Take 2 tablets (650 mg total) by mouth every 6 (six) hours as needed for mild pain (or Fever >/= 101). Patient not taking: Reported on 08/12/2021 05/20/21   Shon Hale, MD  albuterol (VENTOLIN HFA) 108 (90 Base) MCG/ACT inhaler Inhale 2 puffs into the lungs every 6 (six) hours as needed for wheezing or shortness of breath (cough). Patient not taking: Reported on 06/26/2021 06/20/21   Shon Hale, MD  cefpodoxime (VANTIN) 200 MG tablet Take 1  tablet (200 mg total) by mouth 2 (two) times daily. Patient not taking: Reported on 08/12/2021 07/03/21   Theron Arista, PA-C  ciprofloxacin (CIPRO) 500 MG tablet Take 1 tablet (500 mg total) by mouth 2 (two) times daily. Patient not taking: Reported on 08/12/2021 07/20/21   Pollyann Savoy, MD  lactobacillus (FLORANEX/LACTINEX) PACK Take 1 packet (1 g total) by mouth 3 (three) times daily with meals. Patient not taking: Reported on 05/29/2021 05/20/21   Shon Hale, MD  midodrine (PROAMATINE) 10 MG tablet Take 1 tablet (10 mg total) by mouth 3 (three) times daily with meals. To Prevent Low Blood Pressure Patient not taking: Reported on 06/26/2021 06/20/21   Shon Hale, MD  polyvinyl alcohol (LIQUIFILM TEARS) 1.4 % ophthalmic solution Place 1 drop into both eyes as needed for dry eyes. Patient not taking: Reported on 06/26/2021 06/20/21   Shon Hale, MD  senna-docusate (SENOKOT-S) 8.6-50 MG tablet Take 2 tablets by mouth at bedtime. Patient not taking: Reported on 06/26/2021 06/20/21 06/20/22  Shon Hale, MD  traZODone (DESYREL) 50 MG tablet Take 1 tablet (50 mg total) by mouth at bedtime as needed for sleep. Patient not taking: Reported on 06/26/2021 06/20/21   Shon Hale, MD     Family History  Problem Relation Age of Onset   Diabetes Maternal Grandmother    Hypertension Maternal Grandmother    Kidney disease Maternal Grandmother     Social History   Socioeconomic History   Marital status: Single    Spouse name: Not on file  Number of children: Not on file   Years of education: Not on file   Highest education level: Not on file  Occupational History   Not on file  Tobacco Use   Smoking status: Never   Smokeless tobacco: Never  Vaping Use   Vaping Use: Never used  Substance and Sexual Activity   Alcohol use: No   Drug use: No   Sexual activity: Yes    Birth control/protection: None  Other Topics Concern   Not on file  Social History Narrative   Left handed     Lives with kid   Caffeine use: none   Social Determinants of Health   Financial Resource Strain: Not on file  Food Insecurity: Not on file  Transportation Needs: Not on file  Physical Activity: Not on file  Stress: Not on file  Social Connections: Not on file    Review of Systems: A 12 point ROS discussed and pertinent positives are indicated in the HPI above.  All other systems are negative.  Review of Systems  All other systems reviewed and are negative.  Vital Signs: BP 108/73   Pulse 72   Temp 97.8 F (36.6 C) (Oral)   Resp 18   Ht 5\' 3"  (1.6 m)   Wt 180 lb (81.6 kg)   SpO2 99%   BMI 31.89 kg/m   Physical Exam Vitals reviewed.  Constitutional:      General: She is not in acute distress.    Appearance: Normal appearance. She is not ill-appearing.  HENT:     Head: Normocephalic and atraumatic.     Mouth/Throat:     Mouth: Mucous membranes are dry.     Pharynx: Oropharynx is clear.  Cardiovascular:     Rate and Rhythm: Normal rate and regular rhythm.     Pulses: Normal pulses.     Heart sounds: Normal heart sounds.  Pulmonary:     Effort: Pulmonary effort is normal. No respiratory distress.     Breath sounds: Normal breath sounds.  Abdominal:     General: Bowel sounds are normal. There is no distension.     Tenderness: There is no abdominal tenderness. There is no guarding.  Musculoskeletal:     Right lower leg: No edema.     Left lower leg: No edema.  Skin:    General: Skin is warm.  Neurological:     Mental Status: She is alert and oriented to person, place, and time.  Psychiatric:        Mood and Affect: Mood normal.        Behavior: Behavior normal.        Thought Content: Thought content normal.        Judgment: Judgment normal.    Imaging: No results found.  Labs:  CBC: Recent Labs    06/20/21 0536 07/03/21 1312 07/14/21 0836 07/19/21 1959  WBC 4.7 8.1 3.6* 7.5  HGB 8.8* 13.1 12.4 13.5  HCT 28.0* 41.1 37.1 40.3  PLT 151 344 218  255    COAGS: Recent Labs    05/14/21 1423 05/15/21 0415 06/17/21 1050 07/14/21 0836  INR 1.3* 1.5* 1.2 1.2  APTT 26  --  26  --     BMP: Recent Labs    06/18/21 0415 06/20/21 0536 07/03/21 1312 07/19/21 1959  NA 140 139 137 138  K 3.1* 3.9 4.2 4.2  CL 111 112* 103 106  CO2 22 22 24 22   GLUCOSE 110* 88 102* 100*  BUN  14 7 27* 24*  CALCIUM 8.0* 8.3* 9.8 9.7  CREATININE 0.81 0.69 1.04* 1.17*  GFRNONAA >60 >60 >60 >60    LIVER FUNCTION TESTS: Recent Labs    05/18/21 0513 05/29/21 2000 06/17/21 1050 06/20/21 0536 07/19/21 1959  BILITOT 0.2* 1.3* 0.6  --  0.5  AST 76* 24 27  --  16  ALT 110* 26 14  --  16  ALKPHOS 140* 82 60  --  61  PROT 6.1* 8.1 8.3*  --  8.6*  ALBUMIN 2.4* 3.6 3.8 2.6* 3.7    TUMOR MARKERS: No results for input(s): AFPTM, CEA, CA199, CHROMGRNA in the last 8760 hours.  Assessment and Plan: History of progressive MS and urinary retention. Pt had 82F suprapubic catheter placed 07/14/21 with Dr. Milford Cage. Pt returns today for SP catheter upsize.   Pt resting on stretcher with mother at bedside.  Pt is A&O, calm and pleasant. She is in no distress.  Pt states she is NPO per order.   Risks and benefits discussed with the patient and her mother including bleeding, infection, damage to adjacent structures, bowel perforation/fistula connection, and sepsis.  All of the patient's and her mother's questions were answered, patient is agreeable to proceed. Consent signed and in chart.   Thank you for this interesting consult.  I greatly enjoyed meeting Melissa West and look forward to participating in their care.  A copy of this report was sent to the requesting provider on this date.  Electronically Signed: Shon Hough, NP 09/10/2021, 11:13 AM   I spent a total of 20 minutes in face to face in clinical consultation, greater than 50% of which was counseling/coordinating care for suprapubic catheter upsize.

## 2021-09-20 DIAGNOSIS — G35 Multiple sclerosis: Secondary | ICD-10-CM | POA: Diagnosis not present

## 2021-09-21 ENCOUNTER — Encounter (HOSPITAL_COMMUNITY): Payer: Self-pay

## 2021-09-21 ENCOUNTER — Other Ambulatory Visit: Payer: Self-pay

## 2021-09-21 ENCOUNTER — Emergency Department (HOSPITAL_COMMUNITY)
Admission: EM | Admit: 2021-09-21 | Discharge: 2021-09-21 | Disposition: A | Discharge status: Home / Self Care | Payer: Medicare Other | Attending: Emergency Medicine | Admitting: Emergency Medicine

## 2021-09-21 DIAGNOSIS — R339 Retention of urine, unspecified: Secondary | ICD-10-CM | POA: Diagnosis present

## 2021-09-21 DIAGNOSIS — T83518A Infection and inflammatory reaction due to other urinary catheter, initial encounter: Secondary | ICD-10-CM | POA: Diagnosis not present

## 2021-09-21 DIAGNOSIS — Y732 Prosthetic and other implants, materials and accessory gastroenterology and urology devices associated with adverse incidents: Secondary | ICD-10-CM | POA: Insufficient documentation

## 2021-09-21 DIAGNOSIS — R69 Illness, unspecified: Secondary | ICD-10-CM | POA: Diagnosis not present

## 2021-09-21 DIAGNOSIS — T83198A Other mechanical complication of other urinary devices and implants, initial encounter: Secondary | ICD-10-CM | POA: Diagnosis not present

## 2021-09-21 DIAGNOSIS — Z743 Need for continuous supervision: Secondary | ICD-10-CM | POA: Diagnosis not present

## 2021-09-21 DIAGNOSIS — R279 Unspecified lack of coordination: Secondary | ICD-10-CM | POA: Diagnosis not present

## 2021-09-21 DIAGNOSIS — R5381 Other malaise: Secondary | ICD-10-CM | POA: Diagnosis not present

## 2021-09-21 DIAGNOSIS — T83090A Other mechanical complication of cystostomy catheter, initial encounter: Secondary | ICD-10-CM

## 2021-09-21 LAB — CBC WITH DIFFERENTIAL/PLATELET
Abs Immature Granulocytes: 0.01 10*3/uL (ref 0.00–0.07)
Basophils Absolute: 0 10*3/uL (ref 0.0–0.1)
Basophils Relative: 0 %
Eosinophils Absolute: 0.2 10*3/uL (ref 0.0–0.5)
Eosinophils Relative: 4 %
HCT: 35.6 % — ABNORMAL LOW (ref 36.0–46.0)
Hemoglobin: 11.6 g/dL — ABNORMAL LOW (ref 12.0–15.0)
Immature Granulocytes: 0 %
Lymphocytes Relative: 40 %
Lymphs Abs: 2 10*3/uL (ref 0.7–4.0)
MCH: 28.6 pg (ref 26.0–34.0)
MCHC: 32.6 g/dL (ref 30.0–36.0)
MCV: 87.7 fL (ref 80.0–100.0)
Monocytes Absolute: 0.4 10*3/uL (ref 0.1–1.0)
Monocytes Relative: 7 %
Neutro Abs: 2.5 10*3/uL (ref 1.7–7.7)
Neutrophils Relative %: 49 %
Platelets: 279 10*3/uL (ref 150–400)
RBC: 4.06 MIL/uL (ref 3.87–5.11)
RDW: 14.2 % (ref 11.5–15.5)
WBC: 5.1 10*3/uL (ref 4.0–10.5)
nRBC: 0 % (ref 0.0–0.2)

## 2021-09-21 LAB — BASIC METABOLIC PANEL
Anion gap: 4 — ABNORMAL LOW (ref 5–15)
BUN: 19 mg/dL (ref 6–20)
CO2: 28 mmol/L (ref 22–32)
Calcium: 8.9 mg/dL (ref 8.9–10.3)
Chloride: 106 mmol/L (ref 98–111)
Creatinine, Ser: 0.72 mg/dL (ref 0.44–1.00)
GFR, Estimated: >60 mL/min (ref >60)
Glucose, Bld: 95 mg/dL (ref 70–99)
Potassium: 3.7 mmol/L (ref 3.5–5.1)
Sodium: 138 mmol/L (ref 135–145)

## 2021-09-21 LAB — URINALYSIS, ROUTINE W REFLEX MICROSCOPIC
Bilirubin Urine: NEGATIVE
Glucose, UA: NEGATIVE mg/dL
Ketones, ur: NEGATIVE mg/dL
Nitrite: POSITIVE — AB
Protein, ur: 100 mg/dL — AB
RBC / HPF: >50 RBC/hpf — ABNORMAL HIGH (ref 0–5)
Specific Gravity, Urine: 1.021 (ref 1.005–1.030)
WBC, UA: >50 WBC/hpf — ABNORMAL HIGH (ref 0–5)
pH: 6 (ref 5.0–8.0)

## 2021-09-21 NOTE — ED Notes (Signed)
[employer] notified of patient needing transportation back home. 

## 2021-09-21 NOTE — ED Notes (Signed)
Pt with suprapubic cath. Denies any pain around site, and denies bladder fullness. States she has not had much output today. Bladder scan shows 121 mL. Pt has a small amount of urine noted in catheter bag.

## 2021-09-21 NOTE — ED Notes (Signed)
Pt provided discharge instructions and prescription information. Pt was given the opportunity to ask questions and questions were answered.   

## 2021-09-21 NOTE — ED Provider Notes (Signed)
SUPRAPUBIC TUBE PLACEMENT  Date/Time: 09/21/2021 9:09 PM  Performed by: Cheryll Cockayne, MD Authorized by: Cheryll Cockayne, MD   Comments:     Suprapubic catheter replaced myself.  Balloon was let down and myself. subsequently replaced with a new good amount of yellow appearing urine outflow noted. 16 French Foley catheter.  20 cc instilled into the balloon and patient tolerated procedure without difficulty.  Discharged home in stable condition.     Cheryll Cockayne, MD 09/21/21 2110

## 2021-09-21 NOTE — ED Notes (Signed)
Attempted to irrigate urinary cath and met resistance. EDP notified.

## 2021-09-21 NOTE — ED Triage Notes (Signed)
Patient via EMS from home due to urinary cath problem.

## 2021-09-21 NOTE — Discharge Instructions (Signed)
Your suprapubic catheter was clogged this was exchanged today.  Please follow-up closely with your urologist.  Return to the ED or call your urologist if you have additional issues with your catheter.

## 2021-09-23 DIAGNOSIS — G35 Multiple sclerosis: Secondary | ICD-10-CM | POA: Diagnosis not present

## 2021-09-24 LAB — URINE CULTURE: Culture: >=100000 — AB

## 2021-09-25 ENCOUNTER — Telehealth: Payer: Self-pay | Admitting: *Deleted

## 2021-09-25 NOTE — Telephone Encounter (Signed)
Post ED Visit - Positive Culture Follow-up  Culture report reviewed by antimicrobial stewardship pharmacist: [facility] []  , Pharm.D. []  Enzo Bi, Pharm.D., BCPS AQ-ID []  , Pharm.D., BCPS []  Celedonio Miyamoto, Pharm.D., BCPS []  Garfield, Garvin Fila.D., BCPS, AAHIVP []  , Pharm.D., BCPS, AAHIVP []  Georgina Pillion, PharmD, BCPS []  , PharmD, BCPS []  [place], PharmD, BCPS []  [address], PharmD []  , PharmD, BCPS []  Estella Husk, PharmD  Pharmacy Team []  Lysle Pearl, PharmD []  , PharmD []  Phillips Climes, PharmD []  , Rph []  Agapito Games) , PharmD []  Verlan Friends, PharmD []  , PharmD []  Mervyn Gay, PharmD []  , PharmD []  Vinnie Level, PharmD []  [facility], PharmD []  , PharmD []  Len Childs, PharmD   Positive urine culture Asymptomatic and no further patient follow-up is required at this time. Lorin Roemhildt, PA-C  [place] 09/25/2021, 11:42 AM

## 2021-09-25 NOTE — Progress Notes (Signed)
ED Antimicrobial Stewardship Positive Culture Follow Up   Melissa West is an 31 y.o. female who presented to [facility] on 09/21/2021 with a chief complaint of  Chief Complaint  Patient presents with   Urinary Retention    Recent Results (from the past 720 hour(s))  Urine Culture     Status: Abnormal   Collection Time: 09/21/21  8:16 PM   Specimen: Urine, Suprapubic  Result Value Ref Range Status   Specimen Description   Final    URINE, SUPRAPUBIC Performed at [facility], [address] [place], [place] [address]    Special Requests   Final    NONE Performed at [facility], [address], [place], [place] [address]    Culture >=100,000 COLONIES/mL ESCHERICHIA COLI (A)  Final   Report Status 09/24/2021 FINAL  Final   Organism ID, Bacteria ESCHERICHIA COLI (A)  Final      Susceptibility   Escherichia coli - MIC*    AMPICILLIN 16 INTERMEDIATE Intermediate     CEFAZOLIN <=4 SENSITIVE Sensitive     CEFEPIME <=0.12 SENSITIVE Sensitive     CEFTRIAXONE <=0.25 SENSITIVE Sensitive     CIPROFLOXACIN >=4 RESISTANT Resistant     GENTAMICIN <=1 SENSITIVE Sensitive     IMIPENEM <=0.25 SENSITIVE Sensitive     NITROFURANTOIN <=16 SENSITIVE Sensitive     TRIMETH/SULFA <=20 SENSITIVE Sensitive     AMPICILLIN/SULBACTAM 8 SENSITIVE Sensitive     PIP/TAZO <=4 SENSITIVE Sensitive     * >=100,000 COLONIES/mL ESCHERICHIA COLI    Urine culture positive, however pt presented with clogged suprapubic catheter with no symptoms of UTI. Suprapubic catheter was exchanged in the ED. No further treatment indicated.   New antibiotic prescription: N/A  ED Provider: Aline August, PA-C   [name] 09/25/2021, 11:40 AM Clinical Pharmacist Monday - Friday phone -  [phone] Saturday - Sunday phone - [phone]

## 2021-09-30 ENCOUNTER — Telehealth: Payer: Self-pay

## 2021-09-30 NOTE — Telephone Encounter (Signed)
Patient called advising that she spoke to the Physical Therapist at [facility] and they advised they needed an order for her to complete Therapy. She was calling to see if that could be sent over to them.

## 2021-10-02 ENCOUNTER — Other Ambulatory Visit: Payer: Self-pay

## 2021-10-02 ENCOUNTER — Emergency Department (HOSPITAL_COMMUNITY)
Admission: EM | Admit: 2021-10-02 | Discharge: 2021-10-02 | Disposition: A | Discharge status: Home / Self Care | Payer: Medicare Other | Attending: Emergency Medicine | Admitting: Emergency Medicine

## 2021-10-02 ENCOUNTER — Encounter (HOSPITAL_COMMUNITY): Payer: Self-pay | Admitting: Emergency Medicine

## 2021-10-02 DIAGNOSIS — E161 Other hypoglycemia: Secondary | ICD-10-CM | POA: Diagnosis not present

## 2021-10-02 DIAGNOSIS — E162 Hypoglycemia, unspecified: Secondary | ICD-10-CM | POA: Diagnosis not present

## 2021-10-02 DIAGNOSIS — T839XXA Unspecified complication of genitourinary prosthetic device, implant and graft, initial encounter: Secondary | ICD-10-CM

## 2021-10-02 DIAGNOSIS — Z7401 Bed confinement status: Secondary | ICD-10-CM | POA: Diagnosis not present

## 2021-10-02 DIAGNOSIS — Z79899 Other long term (current) drug therapy: Secondary | ICD-10-CM | POA: Diagnosis not present

## 2021-10-02 DIAGNOSIS — T83098A Other mechanical complication of other indwelling urethral catheter, initial encounter: Secondary | ICD-10-CM | POA: Insufficient documentation

## 2021-10-02 DIAGNOSIS — R5381 Other malaise: Secondary | ICD-10-CM | POA: Diagnosis not present

## 2021-10-02 DIAGNOSIS — Z743 Need for continuous supervision: Secondary | ICD-10-CM | POA: Diagnosis not present

## 2021-10-02 DIAGNOSIS — R69 Illness, unspecified: Secondary | ICD-10-CM | POA: Diagnosis not present

## 2021-10-02 DIAGNOSIS — Y732 Prosthetic and other implants, materials and accessory gastroenterology and urology devices associated with adverse incidents: Secondary | ICD-10-CM | POA: Insufficient documentation

## 2021-10-02 LAB — URINALYSIS, ROUTINE W REFLEX MICROSCOPIC
Bilirubin Urine: NEGATIVE
Glucose, UA: NEGATIVE mg/dL
Hgb urine dipstick: NEGATIVE
Ketones, ur: NEGATIVE mg/dL
Nitrite: NEGATIVE
Protein, ur: >=300 mg/dL — AB
Specific Gravity, Urine: 1.025 (ref 1.005–1.030)
WBC, UA: >50 WBC/hpf — ABNORMAL HIGH (ref 0–5)
pH: 6 (ref 5.0–8.0)

## 2021-10-02 NOTE — ED Triage Notes (Signed)
Pt w/c bound. Mother called ems due to unable to flush chronic foley cath. A/o. Pt c/o "a little" pain to suprapubic area. Nad.

## 2021-10-03 ENCOUNTER — Encounter (HOSPITAL_COMMUNITY): Payer: Self-pay

## 2021-10-03 ENCOUNTER — Emergency Department (HOSPITAL_COMMUNITY)
Admission: EM | Admit: 2021-10-03 | Discharge: 2021-10-04 | Disposition: A | Discharge status: Home / Self Care | Payer: Medicare Other | Attending: Student | Admitting: Student

## 2021-10-03 ENCOUNTER — Other Ambulatory Visit: Payer: Self-pay

## 2021-10-03 DIAGNOSIS — T83098D Other mechanical complication of other indwelling urethral catheter, subsequent encounter: Secondary | ICD-10-CM | POA: Diagnosis not present

## 2021-10-03 DIAGNOSIS — Y732 Prosthetic and other implants, materials and accessory gastroenterology and urology devices associated with adverse incidents: Secondary | ICD-10-CM | POA: Diagnosis not present

## 2021-10-03 DIAGNOSIS — R6889 Other general symptoms and signs: Secondary | ICD-10-CM | POA: Diagnosis not present

## 2021-10-03 DIAGNOSIS — T83010D Breakdown (mechanical) of cystostomy catheter, subsequent encounter: Secondary | ICD-10-CM

## 2021-10-03 DIAGNOSIS — Z743 Need for continuous supervision: Secondary | ICD-10-CM | POA: Diagnosis not present

## 2021-10-03 DIAGNOSIS — T83091A Other mechanical complication of indwelling urethral catheter, initial encounter: Secondary | ICD-10-CM | POA: Insufficient documentation

## 2021-10-03 DIAGNOSIS — T83198D Other mechanical complication of other urinary devices and implants, subsequent encounter: Secondary | ICD-10-CM | POA: Diagnosis not present

## 2021-10-03 MED ORDER — CEPHALEXIN 500 MG PO CAPS: 500.0000 mg | ORAL_CAPSULE | Freq: Four times a day (QID) | ORAL | 0 refills | Status: DC

## 2021-10-03 MED ORDER — CEPHALEXIN 500 MG PO CAPS
500.0000 mg | ORAL_CAPSULE | Freq: Once | ORAL | Status: AC
Start: 2021-10-03 — End: 2021-10-03
  Administered 2021-10-03: 500 mg via ORAL
  Filled 2021-10-03: qty 1

## 2021-10-03 NOTE — ED Triage Notes (Signed)
Patient presents to ED via [facility] with c/o pain in suprapubic area. Foley catheter obstructed and not flushing well. Patient has a diagnoses of Multiple Sclerosis. No acute distress noted.

## 2021-10-04 DIAGNOSIS — T83091A Other mechanical complication of indwelling urethral catheter, initial encounter: Secondary | ICD-10-CM | POA: Diagnosis not present

## 2021-10-04 LAB — URINE CULTURE: Culture: >=100000 — AB

## 2021-10-04 NOTE — ED Provider Notes (Signed)
SUPRAPUBIC TUBE PLACEMENT  Date/Time: 10/04/2021 12:45 PM  Performed by: Glendora Score, MD Authorized by: Glendora Score, MD   Consent:    Consent obtained:  Verbal   Consent given by:  Patient   Risks discussed:  Bleeding, bowel perforation, infection and pain   Alternatives discussed:  No treatment, delayed treatment and alternative treatment Sedation:    Sedation type:  None Anesthesia:    Anesthesia method:  None Procedure details:    Complexity:  Simple   Catheter type:  Foley   Catheter size:  18 Fr   Ultrasound guidance: no     Number of attempts:  1   Urine characteristics:  Blood-tinged and mildly cloudy Post-procedure details:    Procedure completion:  Tolerated well, no immediate complications     Glendora Score, MD 10/04/21 1246

## 2021-10-05 ENCOUNTER — Telehealth: Payer: Self-pay | Admitting: Emergency Medicine

## 2021-10-05 NOTE — ED Provider Notes (Signed)
[facility] EMERGENCY DEPARTMENT Provider Note   [name]: [id number] Arrival date & time: 09/21/21  1751     History  Chief Complaint  Patient presents with   Urinary Retention    Joselyn Keltner is a 31 y.o. female.  Kendyl Festa is a 31 y.o. female with a history of MS and chronic urinary retention, who presents to the ED reporting urinary catheter issue.  Patient had a suprapubic catheter placed on 4/4 by Dr. Alyson Ingles with urology prior to this patient had been using a Foley catheter for chronic urinary retention related to her MS.  Patient presents today reporting that her suprapubic catheter has not drained at all today.  She reports yesterday it was functioning normally and she had typical urinary output but she reports today she has noticed no urine moving into the bag.  She reports that her catheter was most recently changed on 6/1 and this was done with interventional radiology.  She denies any lower abdominal or suprapubic pain.  Denies any fevers or chills, no flank pain and yesterday did not notice any change in urine color.  The history is provided by the patient and medical records.       Home Medications Prior to Admission medications   Medication Sig Start Date End Date Taking? Authorizing Provider  ergocalciferol (VITAMIN D2) 1.25 MG (50000 UT) capsule Take 1 capsule (50,000 Units total) by mouth once a week. Patient not taking: Reported on 10/02/2021 06/20/21  Yes Emokpae, Courage, MD  KESIMPTA 20 MG/0.4ML SOAJ Inject 20 mg into the skin every 30 (thirty) days. 06/24/21  Yes [provider]  acetaminophen (TYLENOL) 325 MG tablet Take 2 tablets (650 mg total) by mouth every 6 (six) hours as needed for mild pain (or Fever >/= 101). Patient not taking: Reported on 08/12/2021 05/20/21   Roxan Hockey, MD  albuterol (VENTOLIN HFA) 108 (90 Base) MCG/ACT inhaler Inhale 2 puffs into the lungs every 6 (six) hours as needed for wheezing or shortness of breath  (cough). Patient not taking: Reported on 06/26/2021 06/20/21   Roxan Hockey, MD  baclofen (LIORESAL) 10 MG tablet Take 1 tablet (10 mg total) by mouth 2 (two) times daily. Take 1/2 to 1 pill po tid for Muscle spasm Patient not taking: Reported on 09/21/2021 06/20/21   Roxan Hockey, MD  cefpodoxime (VANTIN) 200 MG tablet Take 1 tablet (200 mg total) by mouth 2 (two) times daily. Patient not taking: Reported on 08/12/2021 07/03/21   Sherrill Raring, PA-C  cephALEXin (KEFLEX) 500 MG capsule Take 1 capsule (500 mg total) by mouth 4 (four) times daily. 10/03/21   Azucena Cecil, PA-C  ciprofloxacin (CIPRO) 500 MG tablet Take 1 tablet (500 mg total) by mouth 2 (two) times daily. Patient not taking: Reported on 08/12/2021 07/20/21   Truddie Hidden, MD  lactobacillus (FLORANEX/LACTINEX) PACK Take 1 packet (1 g total) by mouth 3 (three) times daily with meals. Patient not taking: Reported on 05/29/2021 05/20/21   Roxan Hockey, MD  midodrine (PROAMATINE) 10 MG tablet Take 1 tablet (10 mg total) by mouth 3 (three) times daily with meals. To Prevent Low Blood Pressure Patient not taking: Reported on 06/26/2021 06/20/21   Roxan Hockey, MD  polyvinyl alcohol (LIQUIFILM TEARS) 1.4 % ophthalmic solution Place 1 drop into both eyes as needed for dry eyes. Patient not taking: Reported on 06/26/2021 06/20/21   Roxan Hockey, MD  senna-docusate (SENOKOT-S) 8.6-50 MG tablet Take 2 tablets by mouth at bedtime. Patient not taking: Reported on  06/26/2021 06/20/21 06/20/22  Roxan Hockey, MD  traZODone (DESYREL) 50 MG tablet Take 1 tablet (50 mg total) by mouth at bedtime as needed for sleep. Patient not taking: Reported on 06/26/2021 06/20/21   Roxan Hockey, MD      Allergies    Patient has no allergy information on record.    Review of Systems   Review of Systems  Constitutional:  Negative for chills and fever.  Gastrointestinal:  Negative for abdominal pain, nausea and vomiting.  Genitourinary:   Positive for decreased urine volume and difficulty urinating. Negative for dysuria, hematuria and pelvic pain.    Physical Exam Updated Vital Signs BP 110/88   Pulse 73   Temp 97.9 F (36.6 C) (Oral)   Resp 16   Ht _0  (1.6 m)   Wt 81.6 kg   LMP 08/10/2021 (Approximate)   SpO2 99%   BMI 31.89 kg/m  Physical Exam Vitals and nursing note reviewed.  Constitutional:      General: She is not in acute distress.    Appearance: Normal appearance. She is well-developed. She is not ill-appearing or diaphoretic.     Comments: Alert, chronically ill-appearing but in no acute distress.  HENT:     Head: Normocephalic and atraumatic.  Eyes:     General:        Right eye: No discharge.        Left eye: No discharge.  Cardiovascular:     Rate and Rhythm: Normal rate and regular rhythm.  Pulmonary:     Effort: Pulmonary effort is normal. No respiratory distress.     Breath sounds: Normal breath sounds.  Abdominal:     General: Bowel sounds are normal. There is no distension.     Palpations: Abdomen is soft.     Tenderness: There is no abdominal tenderness. There is no guarding.     Comments: Abdomen is soft, nondistended, bowel sounds present, nontender to palpation throughout.  Suprapubic catheter noted without surrounding palpable distended bladder or tenderness, skin surrounding the suprapubic catheter is clean and dry without erythema or breakdown.  No urine draining into the bag, very small amount of amber urine noted in the tubing.  No flank tenderness bilaterally.  Musculoskeletal:        General: No deformity.  Skin:    General: Skin is warm and dry.  Neurological:     Mental Status: She is alert and oriented to person, place, and time.     Coordination: Coordination normal.  Psychiatric:        Mood and Affect: Mood normal.        Behavior: Behavior normal.     ED Results / Procedures / Treatments   Labs (all labs ordered are listed, but only abnormal results are  displayed) Labs Reviewed  URINE CULTURE   BASIC METABOLIC PANEL - Abnormal; Notable for the following components:   Anion gap 4 (*)    All other components within normal limits  CBC WITH DIFFERENTIAL/PLATELET - Abnormal; Notable for the following components:   Hemoglobin 11.6 (*)    HCT 35.6 (*)    All other components within normal limits  URINALYSIS, ROUTINE W REFLEX MICROSCOPIC - Abnormal; Notable for the following components:   APPearance TURBID (*)    Hgb urine dipstick MODERATE (*)    Protein, ur 100 (*)    Nitrite POSITIVE (*)    Leukocytes,Ua LARGE (*)    RBC / HPF >50 (*)    WBC, UA >50 (*)  Bacteria, UA MANY (*)    All other components within normal limits    EKG None  Radiology No results found.  Procedures Procedures    Medications Ordered in ED Medications - No data to display  ED Course/ Medical Decision Making/ A&P                           Medical Decision Making Amount and/or Complexity of Data Reviewed Labs: ordered.   31 y.o. female presents to the ED with complaints of suprapubic catheter issue, this involves an extensive number of treatment options, and is a complaint that carries with it a high risk of complications and morbidity.  The differential diagnosis includes suprapubic catheter clogged or displaced, urinary tract infection, decreased urinary output, AKI   On arrival pt is nontoxic, vitals WNL. Exam significant for suprapubic catheter without surrounding tenderness or skin changes, no palpable distended bladder  Additional history obtained from medical records. Previous records obtained and reviewed notes from initial placement of suprapubic catheter as well as 2 most recent exchanges done by IR   Lab Tests:  I Ordered, reviewed, and interpreted labs, which included: No leukocytosis and normal hemoglobin, no electrolyte derangements and normal renal function.  UA nitrite positive with large leukocytes, WBCs, RBCs and many bacteria.   Culture sent.    ED Course:   Nursing attempted to flush catheter and check to ensure that balloon was fully inflated, they were not able to flush catheter and met resistance, suggesting that catheter is clogged.  I requested consultation with the urologist and discussed case including pertinent labs with Dr. Cain Sieve, he reviewed case, and reports that given that most recent catheter exchanges have both been done with IR that I discussed with them, if unable to exchange catheter tonight, could potentially place Foley and have patient follow-up with urology or IR as an outpatient.  Discussed urinalysis with urology, they feel this is all likely due to colonization from suprapubic catheter, I can send for culture but do not recommend treating with antibiotics at this time.  I discussed case with Dr. Markus Daft with IR, he reports now that the tube has been upsized, most recently on 6/1 and should be able to be exchanged at the bedside and not under fluoroscopy.   Dr. Almyra Free was able to exchange suprapubic catheter at bedside without complication, and to be now draining appropriately.  At this time patient's catheter is functioning appropriately and she is stable for discharge home, recommended follow-up closely with urology and return precautions provided.  Portions of this note were generated with [employer]. Dictation errors may occur despite best attempts at proofreading.         Final Clinical Impression(s) / ED Diagnoses Final diagnoses:  Obstruction of suprapubic catheter, initial encounter [facility])    Rx / [place] Orders ED Discharge Orders     None         Janet Berlin 10/05/21 1220    Luna Fuse, MD 10/10/21 [phone]

## 2021-10-11 ENCOUNTER — Encounter (HOSPITAL_COMMUNITY): Payer: Self-pay

## 2021-10-11 ENCOUNTER — Emergency Department (HOSPITAL_COMMUNITY)
Admission: EM | Admit: 2021-10-11 | Discharge: 2021-10-11 | Disposition: A | Discharge status: Home / Self Care | Payer: Medicare Other | Attending: Emergency Medicine | Admitting: Emergency Medicine

## 2021-10-11 ENCOUNTER — Other Ambulatory Visit: Payer: Self-pay

## 2021-10-11 DIAGNOSIS — T83031A Leakage of indwelling urethral catheter, initial encounter: Secondary | ICD-10-CM | POA: Insufficient documentation

## 2021-10-11 DIAGNOSIS — Z743 Need for continuous supervision: Secondary | ICD-10-CM | POA: Diagnosis not present

## 2021-10-11 DIAGNOSIS — Z9359 Other cystostomy status: Secondary | ICD-10-CM

## 2021-10-11 DIAGNOSIS — T83098A Other mechanical complication of other indwelling urethral catheter, initial encounter: Secondary | ICD-10-CM | POA: Diagnosis not present

## 2021-10-11 DIAGNOSIS — R69 Illness, unspecified: Secondary | ICD-10-CM | POA: Diagnosis not present

## 2021-10-11 DIAGNOSIS — R339 Retention of urine, unspecified: Secondary | ICD-10-CM | POA: Diagnosis not present

## 2021-10-11 DIAGNOSIS — R5381 Other malaise: Secondary | ICD-10-CM | POA: Diagnosis not present

## 2021-10-11 LAB — URINALYSIS, ROUTINE W REFLEX MICROSCOPIC
Bacteria, UA: NONE SEEN
Bilirubin Urine: NEGATIVE
Glucose, UA: NEGATIVE mg/dL
Ketones, ur: NEGATIVE mg/dL
Nitrite: NEGATIVE
Protein, ur: 100 mg/dL — AB
RBC / HPF: >50 RBC/hpf — ABNORMAL HIGH (ref 0–5)
Specific Gravity, Urine: 1.02 (ref 1.005–1.030)
pH: 6 (ref 5.0–8.0)

## 2021-10-11 NOTE — ED Notes (Signed)
Dc instructions reviewed with pt. Pt denies any questions at thsi time. Pt assisted into wheelchair and into vehicle to be transported home by family.

## 2021-10-11 NOTE — ED Notes (Signed)
[facility] called to set up transport at this time. 

## 2021-10-11 NOTE — Discharge Instructions (Addendum)
Catheter was changed today, follow-up with urology next week for reevaluation.

## 2021-10-11 NOTE — ED Provider Notes (Signed)
[facility] EMERGENCY DEPARTMENT Provider Note   [name]: [id number] Arrival date & time: 10/11/21  1357     History  Chief Complaint  Patient presents with   Urinary Retention    Urinary catheter leaking    Melissa West is a 31 y.o. female.  HPI  Patient with medical history of MS and urinary retention presents today due to Foley catheter issue.  States it is leaking since this morning., denies any abdominal pain, fevers.  Has not followed up with urology recently.  Home Medications Prior to Admission medications   Medication Sig Start Date End Date Taking? Authorizing Provider  acetaminophen (TYLENOL) 325 MG tablet Take 2 tablets (650 mg total) by mouth every 6 (six) hours as needed for mild pain (or Fever >/= 101). Patient not taking: Reported on 08/12/2021 05/20/21   Shon Hale, MD  albuterol (VENTOLIN HFA) 108 (90 Base) MCG/ACT inhaler Inhale 2 puffs into the lungs every 6 (six) hours as needed for wheezing or shortness of breath (cough). Patient not taking: Reported on 06/26/2021 06/20/21   Shon Hale, MD  baclofen (LIORESAL) 10 MG tablet Take 1 tablet (10 mg total) by mouth 2 (two) times daily. Take 1/2 to 1 pill po tid for Muscle spasm Patient not taking: Reported on 09/21/2021 06/20/21   Shon Hale, MD  cefpodoxime (VANTIN) 200 MG tablet Take 1 tablet (200 mg total) by mouth 2 (two) times daily. Patient not taking: Reported on 08/12/2021 07/03/21   Theron Arista, PA-C  cephALEXin (KEFLEX) 500 MG capsule Take 1 capsule (500 mg total) by mouth 4 (four) times daily. 10/03/21   Al Decant, PA-C  ciprofloxacin (CIPRO) 500 MG tablet Take 1 tablet (500 mg total) by mouth 2 (two) times daily. Patient not taking: Reported on 08/12/2021 07/20/21   Pollyann Savoy, MD  ergocalciferol (VITAMIN D2) 1.25 MG (50000 UT) capsule Take 1 capsule (50,000 Units total) by mouth once a week. Patient not taking: Reported on 10/02/2021 06/20/21   Shon Hale, MD  KESIMPTA 20  MG/0.4ML SOAJ Inject 20 mg into the skin every 30 (thirty) days. 06/24/21   [provider]  lactobacillus (FLORANEX/LACTINEX) PACK Take 1 packet (1 g total) by mouth 3 (three) times daily with meals. Patient not taking: Reported on 05/29/2021 05/20/21   Shon Hale, MD  midodrine (PROAMATINE) 10 MG tablet Take 1 tablet (10 mg total) by mouth 3 (three) times daily with meals. To Prevent Low Blood Pressure Patient not taking: Reported on 06/26/2021 06/20/21   Shon Hale, MD  polyvinyl alcohol (LIQUIFILM TEARS) 1.4 % ophthalmic solution Place 1 drop into both eyes as needed for dry eyes. Patient not taking: Reported on 06/26/2021 06/20/21   Shon Hale, MD  senna-docusate (SENOKOT-S) 8.6-50 MG tablet Take 2 tablets by mouth at bedtime. Patient not taking: Reported on 06/26/2021 06/20/21 06/20/22  Shon Hale, MD  traZODone (DESYREL) 50 MG tablet Take 1 tablet (50 mg total) by mouth at bedtime as needed for sleep. Patient not taking: Reported on 06/26/2021 06/20/21   Shon Hale, MD      Allergies    Patient has no known allergies.    Review of Systems   Review of Systems  Physical Exam Updated Vital Signs BP 101/73   Pulse 72   Ht 5\' 2"  (1.575 m)   Wt 81.6 kg   LMP 10/05/2021 (Approximate) Comment: states usually irregular  SpO2 100%   BMI 32.92 kg/m  Physical Exam Vitals and nursing note reviewed. Exam conducted with a  chaperone present.  Constitutional:      Appearance: Normal appearance.  HENT:     Head: Normocephalic and atraumatic.  Eyes:     General: No scleral icterus.       Right eye: No discharge.        Left eye: No discharge.     Extraocular Movements: Extraocular movements intact.     Pupils: Pupils are equal, round, and reactive to light.  Cardiovascular:     Rate and Rhythm: Normal rate and regular rhythm.     Pulses: Normal pulses.     Heart sounds: Normal heart sounds. No murmur heard.    No friction rub. No gallop.  Pulmonary:      Effort: Pulmonary effort is normal. No respiratory distress.     Breath sounds: Normal breath sounds.  Abdominal:     General: Abdomen is flat. Bowel sounds are normal. There is no distension.     Palpations: Abdomen is soft.     Tenderness: There is no abdominal tenderness.     Comments: Suprapubic catheter is in place, no surrounding erythema, purulent drainage or tenderness to the abdomen  Skin:    General: Skin is warm and dry.     Coloration: Skin is not jaundiced.  Neurological:     Mental Status: She is alert. Mental status is at baseline.     Coordination: Coordination normal.     ED Results / Procedures / Treatments   Labs (all labs ordered are listed, but only abnormal results are displayed) Labs Reviewed - No data to display  EKG None  Radiology No results found.  Procedures SUPRAPUBIC TUBE PLACEMENT  Date/Time: 10/11/2021 6:07 PM  Performed by: Theron Arista, PA-C Authorized by: Theron Arista, PA-C   Consent:    Consent obtained:  Verbal   Consent given by:  Patient   Risks, benefits, and alternatives were discussed: yes     Risks discussed:  Bleeding, bowel perforation, infection and pain   Alternatives discussed:  No treatment and delayed treatment Universal protocol:    Procedure explained and questions answered to patient or proxy's satisfaction: yes     Relevant documents present and verified: yes     Imaging studies available: yes     Required blood products, implants, devices, and special equipment available: yes     Site/side marked: yes     Immediately prior to procedure, a time out was called: yes     Patient identity confirmed:  Verbally with patient Sedation:    Sedation type:  None Anesthesia:    Anesthesia method:  None Procedure details:    Complexity:  Simple   Catheter type:  Foley   Catheter size:  16 Fr   Ultrasound guidance: no     Number of attempts:  1   Urine characteristics:  Cloudy Post-procedure details:    Procedure  completion:  Tolerated well, no immediate complications     Medications Ordered in ED Medications - No data to display  ED Course/ Medical Decision Making/ A&P                           Medical Decision Making Amount and/or Complexity of Data Reviewed Labs: ordered.   Patient presents due to suprapubic catheter leaking.  Catheter is leaking on exam, unable to flush by nursing staff.  There is no surrounding tenderness or erythema or discharge from the site, she is not having any fevers or chills.  She is currently on Keflex for recently diagnosed UTI.  Suprapubic catheter changed out by myself.  Patient tolerated well without any complications, draining urine.  UA obtained, urine culture collected and will be pending.  Given patient is already on antibiotics, will not change.  Culture is pending, at this time patient is appropriate for discharge home and follow-up with urology as needed.        Final Clinical Impression(s) / ED Diagnoses Final diagnoses:  None    Rx / DC Orders ED Discharge Orders     None         Theron Arista, Cordelia Poche 10/11/21 2253    Jacalyn Lefevre, MD 10/12/21 [phone]

## 2021-10-11 NOTE — ED Triage Notes (Signed)
Pt had catheter placed last week and woke this morning with urine in bed from catheter leaking. Pt denies any pain or blood in urine.

## 2021-10-12 ENCOUNTER — Ambulatory Visit: Payer: Medicare Other | Admitting: Neurology

## 2021-10-13 LAB — URINE CULTURE: Culture: <10000 — AB

## 2021-10-16 ENCOUNTER — Telehealth: Payer: Self-pay

## 2021-10-16 NOTE — Telephone Encounter (Signed)
Patient called with questions about her catheter.  Called patient back with no answer, left a voicemail informing her we close early today but to return our call with questions.

## 2021-10-20 ENCOUNTER — Telehealth: Payer: Self-pay

## 2021-10-20 DIAGNOSIS — G35 Multiple sclerosis: Secondary | ICD-10-CM | POA: Diagnosis not present

## 2021-10-20 MED ORDER — STERILE WATER FOR IRRIGATION IR SOLN: 50.0000 mL | Freq: Every day | 15 refills | Status: DC

## 2021-10-20 NOTE — Addendum Note (Signed)
Addended by: Grier Rocher on: 10/20/2021 02:42 PM   Modules accepted: Orders

## 2021-10-20 NOTE — Telephone Encounter (Signed)
Patient called to see if she could get a rx for supplies to flush her catheter.  Returned her call for more information as to if she is having any issues with her catheter with no answer.  Left a voicemail asking patient to return our call.

## 2021-10-20 NOTE — Telephone Encounter (Signed)
Patient states that she is having to flush her catheter daily due to sediment.  She was asking for a rx for sterile water.  Sterile water and 2 syringes provided in office for the patient to pick up at her convenience.  Rx sent to pharmacy  and patient aware.

## 2021-10-23 DIAGNOSIS — G35 Multiple sclerosis: Secondary | ICD-10-CM | POA: Diagnosis not present

## 2021-10-26 ENCOUNTER — Encounter (HOSPITAL_COMMUNITY): Payer: Self-pay

## 2021-10-26 ENCOUNTER — Other Ambulatory Visit: Payer: Self-pay

## 2021-10-26 ENCOUNTER — Emergency Department (HOSPITAL_COMMUNITY)
Admission: EM | Admit: 2021-10-26 | Discharge: 2021-10-26 | Disposition: A | Discharge status: Home / Self Care | Payer: Medicare Other | Attending: Emergency Medicine | Admitting: Emergency Medicine

## 2021-10-26 DIAGNOSIS — Z743 Need for continuous supervision: Secondary | ICD-10-CM | POA: Diagnosis not present

## 2021-10-26 DIAGNOSIS — T83010A Breakdown (mechanical) of cystostomy catheter, initial encounter: Secondary | ICD-10-CM

## 2021-10-26 DIAGNOSIS — Y732 Prosthetic and other implants, materials and accessory gastroenterology and urology devices associated with adverse incidents: Secondary | ICD-10-CM | POA: Insufficient documentation

## 2021-10-26 DIAGNOSIS — I499 Cardiac arrhythmia, unspecified: Secondary | ICD-10-CM | POA: Diagnosis not present

## 2021-10-26 DIAGNOSIS — R6889 Other general symptoms and signs: Secondary | ICD-10-CM | POA: Diagnosis not present

## 2021-10-26 DIAGNOSIS — R339 Retention of urine, unspecified: Secondary | ICD-10-CM | POA: Insufficient documentation

## 2021-10-26 DIAGNOSIS — T83098A Other mechanical complication of other indwelling urethral catheter, initial encounter: Secondary | ICD-10-CM | POA: Insufficient documentation

## 2021-10-26 NOTE — ED Notes (Signed)
Suprapubic catheter removed and replaced by Chrissie Noa PA.

## 2021-10-26 NOTE — Discharge Instructions (Signed)
Everything is reassuring, please continue with all home medications.  Follow-up with your PCP and/or urology for further evaluation.  Come back to the emergency department if you develop chest pain, shortness of breath, severe abdominal pain, uncontrolled nausea, vomiting, diarrhea.

## 2021-10-26 NOTE — ED Notes (Signed)
Pt given graham crackers and peanut butter.

## 2021-10-26 NOTE — ED Notes (Signed)
Snack given. No complaints. Awaiting [facility] for transport home.

## 2021-10-26 NOTE — ED Provider Notes (Signed)
[facility] EMERGENCY DEPARTMENT Provider Note   [name]: [id number] Arrival date & time: 10/26/21  1254     History  Chief Complaint  Patient presents with   Possible Foley Blockage    Melissa West is a 31 y.o. female.  HPI  Medical history including MS with urinary retention suprapubic catheter in place presents with complaints of dislodged suprapubic catheter.  Patient states that she went to bed with it in place but then woke up with it out of place, family members try to put it back in but since being back in she has not been able to urinate.  She has no complaints at this time denies any stomach pain fevers chills nausea or vomiting, states this happened the past and they decided placing a new one.    Home Medications Prior to Admission medications   Medication Sig Start Date End Date Taking? Authorizing Provider  acetaminophen (TYLENOL) 325 MG tablet Take 2 tablets (650 mg total) by mouth every 6 (six) hours as needed for mild pain (or Fever >/= 101). Patient not taking: Reported on 08/12/2021 05/20/21   Shon Hale, MD  albuterol (VENTOLIN HFA) 108 (90 Base) MCG/ACT inhaler Inhale 2 puffs into the lungs every 6 (six) hours as needed for wheezing or shortness of breath (cough). Patient not taking: Reported on 06/26/2021 06/20/21   Shon Hale, MD  baclofen (LIORESAL) 10 MG tablet Take 1 tablet (10 mg total) by mouth 2 (two) times daily. Take 1/2 to 1 pill po tid for Muscle spasm Patient not taking: Reported on 09/21/2021 06/20/21   Shon Hale, MD  cefpodoxime (VANTIN) 200 MG tablet Take 1 tablet (200 mg total) by mouth 2 (two) times daily. Patient not taking: Reported on 08/12/2021 07/03/21   Theron Arista, PA-C  cephALEXin (KEFLEX) 500 MG capsule Take 1 capsule (500 mg total) by mouth 4 (four) times daily. 10/03/21   Al Decant, PA-C  ciprofloxacin (CIPRO) 500 MG tablet Take 1 tablet (500 mg total) by mouth 2 (two) times daily. Patient not taking: Reported  on 08/12/2021 07/20/21   Pollyann Savoy, MD  ergocalciferol (VITAMIN D2) 1.25 MG (50000 UT) capsule Take 1 capsule (50,000 Units total) by mouth once a week. Patient not taking: Reported on 10/02/2021 06/20/21   Shon Hale, MD  ergocalciferol (VITAMIN D2) 1.25 MG (50000 UT) capsule Take 1 capsule by mouth once a week.    [provider]  KESIMPTA 20 MG/0.4ML SOAJ Inject 20 mg into the skin every 30 (thirty) days. 06/24/21   [provider]  lactobacillus (FLORANEX/LACTINEX) PACK Take 1 packet (1 g total) by mouth 3 (three) times daily with meals. Patient not taking: Reported on 05/29/2021 05/20/21   Shon Hale, MD  midodrine (PROAMATINE) 10 MG tablet Take 1 tablet (10 mg total) by mouth 3 (three) times daily with meals. To Prevent Low Blood Pressure Patient not taking: Reported on 06/26/2021 06/20/21   Shon Hale, MD  polyvinyl alcohol (LIQUIFILM TEARS) 1.4 % ophthalmic solution Place 1 drop into both eyes as needed for dry eyes. Patient not taking: Reported on 06/26/2021 06/20/21   Shon Hale, MD  senna-docusate (SENOKOT-S) 8.6-50 MG tablet Take 2 tablets by mouth at bedtime. Patient not taking: Reported on 06/26/2021 06/20/21 06/20/22  Shon Hale, MD  traZODone (DESYREL) 50 MG tablet Take 1 tablet (50 mg total) by mouth at bedtime as needed for sleep. Patient not taking: Reported on 06/26/2021 06/20/21   Shon Hale, MD  triamcinolone cream (KENALOG) 0.1 % Apply  1 Application topically 2 (two) times daily.    [provider]  Water For Irrigation, Sterile (STERILE WATER FOR IRRIGATION) Irrigate with 50 mLs as directed daily. Irrigate catheter daily with 50cc of sterile water. 10/20/21   [name], Mardene Celeste, MD      Allergies    Patient has no known allergies.    Review of Systems   Review of Systems  Constitutional:  Negative for chills and fever.  Respiratory:  Negative for shortness of breath.   Cardiovascular:  Negative for chest pain.   Gastrointestinal:  Negative for abdominal pain.  Neurological:  Negative for headaches.    Physical Exam Updated Vital Signs BP 108/90   Pulse 89   Temp 98.1 F (36.7 C) (Oral)   Resp 17   Ht 5\' 3"  (1.6 m)   Wt 72.6 kg   LMP 10/05/2021 (Approximate) Comment: states usually irregular  SpO2 100%   BMI 28.34 kg/m  Physical Exam Vitals and nursing note reviewed.  Constitutional:      General: She is not in acute distress.    Appearance: She is not ill-appearing.  HENT:     Head: Normocephalic and atraumatic.     Nose: No congestion.  Eyes:     Conjunctiva/sclera: Conjunctivae normal.  Cardiovascular:     Rate and Rhythm: Normal rate and regular rhythm.  Pulmonary:     Effort: Pulmonary effort is normal.  Abdominal:     General: There is distension.     Palpations: Abdomen is soft.     Tenderness: There is no abdominal tenderness. There is no right CVA tenderness or left CVA tenderness.     Comments: Chaperone presents abdominal exam was performed, soft nontender, does have slight bulge in the suprapubic region which was nontender during my exam, suprapubic catheter was in place no evidence of infection around the area, there is no urine within the Foley itself.  Skin:    General: Skin is warm and dry.  Neurological:     Mental Status: She is alert.  Psychiatric:        Mood and Affect: Mood normal.     ED Results / Procedures / Treatments   Labs (all labs ordered are listed, but only abnormal results are displayed) Labs Reviewed  URINE CULTURE    EKG None  Radiology No results found.  Procedures BLADDER CATHETERIZATION  Date/Time: 10/26/2021 2:13 PM  Performed by: 10/28/2021, PA-C Authorized by: Carroll Sage, PA-C   Consent:    Consent obtained:  Verbal   Consent given by:  Patient   Risks discussed:  False passage, incomplete procedure, pain and infection   Alternatives discussed:  No treatment, delayed treatment, alternative  treatment, observation and referral Universal protocol:    Patient identity confirmed:  Verbally with patient Pre-procedure details:    Procedure purpose:  Therapeutic   Preparation: Patient was prepped and draped in usual sterile fashion   Anesthesia:    Anesthesia method:  None Procedure details:    Provider performed due to:  Complicated insertion   Catheter insertion:  Indwelling   Catheter type:  Foley   Catheter size:  16 Fr   Bladder irrigation: no     Number of attempts:  1   Urine characteristics:  Clear Post-procedure details:    Procedure completion:  Tolerated well, no immediate complications     Medications Ordered in ED Medications - No data to display  ED Course/ Medical Decision Making/ A&P  Medical Decision Making Amount and/or Complexity of Data Reviewed Labs: ordered.   This patient presents to the ED for concern of suprapubic pubic Foley malfunction, this involves an extensive number of treatment options, and is a complaint that carries with it a high risk of complications and morbidity.  The differential diagnosis includes UTI, Foley blockage, closure of the suprapubic canal    Additional history obtained:  Additional history obtained from N/A External records from outside source obtained and reviewed including previous ED notes   Co morbidities that complicate the patient evaluation  MS  Social Determinants of Health:  Bedbound    Lab Tests:  I Ordered, and personally interpreted labs.  The pertinent results include: Urine culture   Imaging Studies ordered:  I ordered imaging studies including bladder scan I independently visualized and interpreted imaging which showed 80 cc of urine I agree with the radiologist interpretation   Cardiac Monitoring:  The patient was maintained on a cardiac monitor.  I personally viewed and interpreted the cardiac monitored which showed an underlying rhythm of:  N/A   Medicines ordered and prescription drug management:  I ordered medication including N/A I have reviewed the patients home medicines and have made adjustments as needed  Critical Interventions:  N/A   Reevaluation:  Presents with concerns of Foley malfunction, my exam felt slight bulge in the suprapubic region, suspect blockage, will recommend new Foley placement patient agreement this plan  Patient tolerated procedure well urine was immediately flowing out of the Foley catheter.   will collect a urine culture.  Reassessed the patient resting comfortably, Foley catheter is draining without difficulty, agreeable discharge at this time.    Consultations Obtained:  N/A    Test Considered:  Defer on urine analysis-this patient has an indwelling suprapubic catheter, will likely be abnormal, will obtain cultures to see if bacterial growth is present.    Rule out Low suspicion for false lumen as suprapubic catheter is draining without difficulty reassessed with suprapubic ultrasound shows only 80 mL of fluids in it.  I have suspicion for UTI [place], kidney stone denies any urinary symptoms abdomen soft nontender she is nontoxic-appearing.    Dispostion and problem list  After consideration of the diagnostic results and the patients response to treatment, I feel that the patent would benefit from discharge.  Suprapubic malfunction-likely suprapubic catheter was clogged, new catheter was in place, drained out difficulty, follow-up with urology as needed.            Final Clinical Impression(s) / ED Diagnoses Final diagnoses:  Suprapubic catheter dysfunction, initial encounter [facility])    Rx / DC Orders ED Discharge Orders     None         Carroll Sage, PA-C 10/26/21 1521    Bethann Berkshire, MD 10/29/21 1023

## 2021-10-26 NOTE — ED Triage Notes (Signed)
Patient brought in by [facility] for foley.  Patients foley was disconnected sometime last night while she was sleeping.  Patient states that she has had little urine come out since.  Family tried to flush and unable to.  Patient has no other complaints.

## 2021-10-28 LAB — URINE CULTURE: Culture: >=100000 — AB

## 2021-10-29 ENCOUNTER — Telehealth: Payer: Self-pay | Admitting: *Deleted

## 2021-10-29 NOTE — Telephone Encounter (Signed)
Post ED Visit - Positive Culture Follow-up  Culture report reviewed by antimicrobial stewardship pharmacist: [facility] []  , Pharm.D. []  Enzo Bi, Pharm.D., BCPS AQ-ID []  , Pharm.D., BCPS []  Celedonio Miyamoto, Pharm.D., BCPS []  [place], Garvin Fila.D., BCPS, AAHIVP []  , Pharm.D., BCPS, AAHIVP []  Georgina Pillion, PharmD, BCPS []  , PharmD, BCPS []  [place], PharmD, BCPS []  [address], PharmD []  , PharmD, BCPS []  Estella Husk, PharmD  Pharmacy Team []  Lysle Pearl, PharmD []  , PharmD []  Phillips Climes, PharmD []  , Rph []  Agapito Games) , PharmD []  Verlan Friends, PharmD []  , PharmD []  Mervyn Gay, PharmD []  , PharmD []  Vinnie Level, PharmD []  [facility], PharmD []  , PharmD []  Len Childs, PharmD   Positive urine culture Colonization, asymptomatic and no further patient follow-up is required at this time.  , MD  Greer Pickerel Talley 10/29/2021, 11:58 AM

## 2021-10-29 NOTE — ED Provider Notes (Signed)
31 yo female ho ms with suprapubic catheter presented 7/17 with dislodgement of catheter.  Urine replaced and urine culture sent Culture grew out E. coli Given patient was asymptomatic and previous cultures positive, do not feel treatment is indicated at this time Patient was discharged with urology follow-up   Margarita Grizzle, MD 10/29/21 1019

## 2021-10-29 NOTE — Progress Notes (Addendum)
ED Antimicrobial Stewardship Positive Culture Follow Up   Melissa West is an 31 y.o. female who presented to [facility] on 10/26/2021 with a chief complaint of  Chief Complaint  Patient presents with   Possible Foley Blockage    Recent Results (from the past 720 hour(s))  Urine Culture     Status: Abnormal   Collection Time: 10/02/21  2:17 PM   Specimen: Urine, Clean Catch  Result Value Ref Range Status   Specimen Description   Final    URINE, CLEAN CATCH Performed at [facility], [address], [place], [place] [address]    Special Requests   Final    NONE Performed at [facility], [address] [place], [place] [address]    Culture >=100,000 COLONIES/mL ESCHERICHIA COLI (A)  Final   Report Status 10/04/2021 FINAL  Final   Organism ID, Bacteria ESCHERICHIA COLI (A)  Final      Susceptibility   Escherichia coli - MIC*    AMPICILLIN 16 INTERMEDIATE Intermediate     CEFAZOLIN <=4 SENSITIVE Sensitive     CEFEPIME <=0.12 SENSITIVE Sensitive     CEFTRIAXONE <=0.25 SENSITIVE Sensitive     CIPROFLOXACIN >=4 RESISTANT Resistant     GENTAMICIN <=1 SENSITIVE Sensitive     IMIPENEM <=0.25 SENSITIVE Sensitive     NITROFURANTOIN <=16 SENSITIVE Sensitive     TRIMETH/SULFA <=20 SENSITIVE Sensitive     AMPICILLIN/SULBACTAM 4 SENSITIVE Sensitive     PIP/TAZO <=4 SENSITIVE Sensitive     * >=100,000 COLONIES/mL ESCHERICHIA COLI  Urine Culture     Status: Abnormal   Collection Time: 10/11/21  4:36 PM   Specimen: Urine, Suprapubic  Result Value Ref Range Status   Specimen Description   Final    URINE, SUPRAPUBIC Performed at [facility], [address], [place], [place] [address]    Special Requests   Final    NONE Performed at [facility], [address] [place], [place] [address]    Culture (A)  Final    <10,000 COLONIES/mL INSIGNIFICANT GROWTH Performed at [facility], [address], [place], [place] [address]    Report Status 10/13/2021 FINAL  Final  Urine  Culture     Status: Abnormal   Collection Time: 10/26/21  1:35 PM   Specimen: Urine, Suprapubic  Result Value Ref Range Status   Specimen Description   Final    URINE, SUPRAPUBIC Performed at [facility], [address], [place], [place] [address]    Special Requests   Final    NONE Performed at [facility], [address], [place], [place] [address]    Culture >=100,000 COLONIES/mL ESCHERICHIA COLI (A)  Final   Report Status 10/28/2021 FINAL  Final   Organism ID, Bacteria ESCHERICHIA COLI (A)  Final      Susceptibility   Escherichia coli - MIC*    AMPICILLIN 16 INTERMEDIATE Intermediate     CEFAZOLIN <=4 SENSITIVE Sensitive     CEFEPIME <=0.12 SENSITIVE Sensitive     CEFTRIAXONE <=0.25 SENSITIVE Sensitive     CIPROFLOXACIN >=4 RESISTANT Resistant     GENTAMICIN <=1 SENSITIVE Sensitive     IMIPENEM <=0.25 SENSITIVE Sensitive     NITROFURANTOIN <=16 SENSITIVE Sensitive     TRIMETH/SULFA <=20 SENSITIVE Sensitive     AMPICILLIN/SULBACTAM 4 SENSITIVE Sensitive     PIP/TAZO <=4 SENSITIVE Sensitive     * >=100,000 COLONIES/mL ESCHERICHIA COLI    [x]  Patient discharged originally without antimicrobial agent and no further treatment is  indicated at this time for asymptomatic bacteruria in setting of clogged suprapubic catheter.  New antibiotic prescription: N/A  ED Provider: Margarita Grizzle, MD   [name] 10/29/2021, 10:40 AM Clinical Pharmacist Monday - Friday phone -  [phone] Saturday - Sunday phone - [phone]

## 2021-11-03 ENCOUNTER — Other Ambulatory Visit: Payer: Self-pay

## 2021-11-03 MED ORDER — STERILE WATER FOR IRRIGATION IR SOLN: 50.0000 mL | Freq: Every day | 15 refills | Status: DC

## 2021-11-04 ENCOUNTER — Encounter (HOSPITAL_COMMUNITY): Payer: Self-pay

## 2021-11-04 ENCOUNTER — Inpatient Hospital Stay (HOSPITAL_COMMUNITY)
Admission: EM | Admit: 2021-11-04 | Discharge: 2021-11-08 | DRG: 699 | Disposition: A | Discharge status: Home / Self Care | Payer: Medicare Other | Attending: Family Medicine | Admitting: Family Medicine

## 2021-11-04 ENCOUNTER — Emergency Department (HOSPITAL_COMMUNITY): Payer: Medicare Other

## 2021-11-04 ENCOUNTER — Other Ambulatory Visit: Payer: Self-pay

## 2021-11-04 DIAGNOSIS — Z841 Family history of disorders of kidney and ureter: Secondary | ICD-10-CM | POA: Diagnosis not present

## 2021-11-04 DIAGNOSIS — N3000 Acute cystitis without hematuria: Secondary | ICD-10-CM | POA: Diagnosis not present

## 2021-11-04 DIAGNOSIS — M79674 Pain in right toe(s): Secondary | ICD-10-CM | POA: Diagnosis not present

## 2021-11-04 DIAGNOSIS — T83098A Other mechanical complication of other indwelling urethral catheter, initial encounter: Secondary | ICD-10-CM | POA: Diagnosis not present

## 2021-11-04 DIAGNOSIS — Z87440 Personal history of urinary (tract) infections: Secondary | ICD-10-CM

## 2021-11-04 DIAGNOSIS — T83090A Other mechanical complication of cystostomy catheter, initial encounter: Secondary | ICD-10-CM | POA: Diagnosis not present

## 2021-11-04 DIAGNOSIS — N390 Urinary tract infection, site not specified: Secondary | ICD-10-CM | POA: Diagnosis present

## 2021-11-04 DIAGNOSIS — G35 Multiple sclerosis: Secondary | ICD-10-CM | POA: Diagnosis not present

## 2021-11-04 DIAGNOSIS — Z993 Dependence on wheelchair: Secondary | ICD-10-CM | POA: Diagnosis not present

## 2021-11-04 DIAGNOSIS — R69 Illness, unspecified: Secondary | ICD-10-CM | POA: Diagnosis not present

## 2021-11-04 DIAGNOSIS — Z79899 Other long term (current) drug therapy: Secondary | ICD-10-CM

## 2021-11-04 DIAGNOSIS — T83518A Infection and inflammatory reaction due to other urinary catheter, initial encounter: Secondary | ICD-10-CM | POA: Diagnosis not present

## 2021-11-04 DIAGNOSIS — M86171 Other acute osteomyelitis, right ankle and foot: Secondary | ICD-10-CM

## 2021-11-04 DIAGNOSIS — B9620 Unspecified Escherichia coli [E. coli] as the cause of diseases classified elsewhere: Secondary | ICD-10-CM | POA: Diagnosis present

## 2021-11-04 DIAGNOSIS — Y846 Urinary catheterization as the cause of abnormal reaction of the patient, or of later complication, without mention of misadventure at the time of the procedure: Secondary | ICD-10-CM | POA: Diagnosis present

## 2021-11-04 DIAGNOSIS — N319 Neuromuscular dysfunction of bladder, unspecified: Secondary | ICD-10-CM | POA: Diagnosis present

## 2021-11-04 DIAGNOSIS — M869 Osteomyelitis, unspecified: Secondary | ICD-10-CM

## 2021-11-04 DIAGNOSIS — Y738 Miscellaneous gastroenterology and urology devices associated with adverse incidents, not elsewhere classified: Secondary | ICD-10-CM | POA: Diagnosis present

## 2021-11-04 DIAGNOSIS — R5381 Other malaise: Secondary | ICD-10-CM | POA: Diagnosis not present

## 2021-11-04 DIAGNOSIS — M868X7 Other osteomyelitis, ankle and foot: Secondary | ICD-10-CM | POA: Diagnosis present

## 2021-11-04 DIAGNOSIS — Z743 Need for continuous supervision: Secondary | ICD-10-CM | POA: Diagnosis not present

## 2021-11-04 DIAGNOSIS — T83010A Breakdown (mechanical) of cystostomy catheter, initial encounter: Principal | ICD-10-CM

## 2021-11-04 DIAGNOSIS — I9589 Other hypotension: Secondary | ICD-10-CM | POA: Diagnosis not present

## 2021-11-04 DIAGNOSIS — M7989 Other specified soft tissue disorders: Secondary | ICD-10-CM | POA: Diagnosis not present

## 2021-11-04 LAB — URINALYSIS, ROUTINE W REFLEX MICROSCOPIC
Bilirubin Urine: NEGATIVE
Glucose, UA: NEGATIVE mg/dL
Ketones, ur: NEGATIVE mg/dL
Nitrite: POSITIVE — AB
Protein, ur: >=300 mg/dL — AB
RBC / HPF: >50 RBC/hpf — ABNORMAL HIGH (ref 0–5)
Specific Gravity, Urine: 1.022 (ref 1.005–1.030)
WBC, UA: >50 WBC/hpf — ABNORMAL HIGH (ref 0–5)
pH: 6 (ref 5.0–8.0)

## 2021-11-04 LAB — CBC WITH DIFFERENTIAL/PLATELET
Abs Immature Granulocytes: 0.01 10*3/uL (ref 0.00–0.07)
Basophils Absolute: 0 10*3/uL (ref 0.0–0.1)
Basophils Relative: 0 %
Eosinophils Absolute: 0.1 10*3/uL (ref 0.0–0.5)
Eosinophils Relative: 2 %
HCT: 37.3 % (ref 36.0–46.0)
Hemoglobin: 12.2 g/dL (ref 12.0–15.0)
Immature Granulocytes: 0 %
Lymphocytes Relative: 39 %
Lymphs Abs: 1.8 10*3/uL (ref 0.7–4.0)
MCH: 28.5 pg (ref 26.0–34.0)
MCHC: 32.7 g/dL (ref 30.0–36.0)
MCV: 87.1 fL (ref 80.0–100.0)
Monocytes Absolute: 0.2 10*3/uL (ref 0.1–1.0)
Monocytes Relative: 5 %
Neutro Abs: 2.4 10*3/uL (ref 1.7–7.7)
Neutrophils Relative %: 54 %
Platelets: 227 10*3/uL (ref 150–400)
RBC: 4.28 MIL/uL (ref 3.87–5.11)
RDW: 13.7 % (ref 11.5–15.5)
WBC: 4.6 10*3/uL (ref 4.0–10.5)
nRBC: 0 % (ref 0.0–0.2)

## 2021-11-04 LAB — BASIC METABOLIC PANEL
Anion gap: 5 (ref 5–15)
BUN: 15 mg/dL (ref 6–20)
CO2: 27 mmol/L (ref 22–32)
Calcium: 8.6 mg/dL — ABNORMAL LOW (ref 8.9–10.3)
Chloride: 106 mmol/L (ref 98–111)
Creatinine, Ser: 0.7 mg/dL (ref 0.44–1.00)
GFR, Estimated: >60 mL/min (ref >60)
Glucose, Bld: 96 mg/dL (ref 70–99)
Potassium: 3.5 mmol/L (ref 3.5–5.1)
Sodium: 138 mmol/L (ref 135–145)

## 2021-11-04 LAB — SEDIMENTATION RATE: Sed Rate: 13 mm/hr (ref 0–22)

## 2021-11-04 LAB — LACTIC ACID, PLASMA
Lactic Acid, Venous: 1.4 mmol/L (ref 0.5–1.9)
Lactic Acid, Venous: 0.7 mmol/L (ref 0.5–1.9)

## 2021-11-04 MED ORDER — SODIUM CHLORIDE 0.9 % IV SOLN
1.0000 g | INTRAVENOUS | Status: DC
  Administered 2021-11-05 – 2021-11-07 (×3): 1 g via INTRAVENOUS
  Filled 2021-11-04 (×3): qty 10

## 2021-11-04 MED ORDER — ACETAMINOPHEN 325 MG PO TABS
650.0000 mg | ORAL_TABLET | Freq: Four times a day (QID) | ORAL | Status: DC | PRN
  Administered 2021-11-06: 650 mg via ORAL
  Filled 2021-11-04: qty 2

## 2021-11-04 MED ORDER — ENOXAPARIN SODIUM 40 MG/0.4ML IJ SOSY
40.0000 mg | PREFILLED_SYRINGE | INTRAMUSCULAR | Status: DC
  Administered 2021-11-05 – 2021-11-07 (×3): 40 mg via SUBCUTANEOUS
  Filled 2021-11-04 (×3): qty 0.4

## 2021-11-04 MED ORDER — OXYCODONE HCL 5 MG PO TABS: 5.0000 mg | ORAL_TABLET | ORAL | Status: DC | PRN

## 2021-11-04 MED ORDER — CEFTRIAXONE SODIUM 1 G IJ SOLR
1.0000 g | Freq: Once | INTRAMUSCULAR | Status: AC
Start: 2021-11-04 — End: 2021-11-04
  Administered 2021-11-04: 1 g via INTRAMUSCULAR
  Filled 2021-11-04: qty 10

## 2021-11-04 MED ORDER — LIDOCAINE HCL (PF) 1 % IJ SOLN
5.0000 mL | Freq: Once | INTRAMUSCULAR | Status: AC
  Administered 2021-11-04: 5 mL via INTRADERMAL
  Filled 2021-11-04: qty 5

## 2021-11-04 MED ORDER — ACETAMINOPHEN 650 MG RE SUPP: 650.0000 mg | Freq: Four times a day (QID) | RECTAL | Status: DC | PRN

## 2021-11-04 NOTE — H&P (Signed)
History and Physical    Patient: Melissa West PPJ:[id number] DOB: 1990-10-12 DOA: 11/04/2021 DOS: the patient was seen and examined on 11/04/2021 PCP: Benita Stabile, MD  Patient coming from: Home  Chief Complaint:  Chief Complaint  Patient presents with   Suprapubic Catheter Problem    HPI: Melissa West is a 31 y.o. female with medical history significant of multiple sclerosis, chronic urinary retention requiring Foley catheter with recurrent UTIs, wheelchair-bound at baseline who presents to the emergency department due to leaking urinary catheter.  Patient states that she woke up this morning with leaked urine from the catheter all around her, family thought it was due to clogged catheter, so an attempt was made to flush the catheter without any success.  She also complained of right great toe intermittent pain which started several days ago, she states that oftentimes while on wheelchair, foot drags on the floor.  She denies fever, chills, suprapubic pain, nausea, vomiting, headache. Patient was recently seen in the ED on 7/17 with similar presentation, urinary catheter was replaced without difficulty at that time.  Urine culture obtained grew E. Coli > 100,000 and patient is yet to be treated for this.   ED Course:  In the emergency department, she was hemodynamically stable, BP was 105/79, other vital signs are within normal range.  Work-up in the ED showed normal CBC and BMP, lactic acid 1.4  > 0.7, sed rate 13.  Urinalysis was positive for nitrite, large leukocytes, RBC and WBC > 50, proteinuria and many bacteria.  Blood culture pending. Right foot x-ray showed no recent fracture or dislocation is seen. There are patchy areas of low attenuation in the bony structures which may be due to osteopenia. Possibility of infectious process is not excluded. There is no definite demonstrable break in the cortical margins. If there is clinical suspicion for osteomyelitis, follow-up  three-phase bone scan or MRI should be considered. She was treated with IV ceftriaxone for UTI.  Hospitalist was asked to admit patient for further evaluation and management.  Review of Systems: Review of systems as noted in the HPI. All other systems reviewed and are negativ  Past Medical History:  Diagnosis Date   MS (multiple sclerosis) (HCC)    No pertinent past medical history    Past Surgical History:  Procedure Laterality Date   CESAREAN SECTION  08/09/2011   Procedure: CESAREAN SECTION;  Surgeon: Lesly Dukes, MD;  Location: [facility];  Service: Gynecology;  Laterality: N/A;   IR CATHETER TUBE CHANGE  07/22/2021   IR CATHETER TUBE CHANGE  09/10/2021    Social History:  reports that she has never smoked. She has never used smokeless tobacco. She reports that she does not drink alcohol and does not use drugs.   No Known Allergies  Family History  Problem Relation Age of Onset   Diabetes Maternal Grandmother    Hypertension Maternal Grandmother    Kidney disease Maternal Grandmother      Prior to Admission medications   Medication Sig Start Date End Date Taking? Authorizing Provider  ergocalciferol (VITAMIN D2) 1.25 MG (50000 UT) capsule Take 1 capsule by mouth once a week.   Yes [provider]  KESIMPTA 20 MG/0.4ML SOAJ Inject 20 mg into the skin every 30 (thirty) days. 06/24/21  Yes [provider]  Water For Irrigation, Sterile (STERILE WATER FOR IRRIGATION) Irrigate with 50 mLs as directed daily. Irrigate catheter daily with 50cc of sterile water. 11/03/21  Yes [name], Mardene Celeste, MD  acetaminophen (TYLENOL) 325 MG tablet Take 2 tablets (650 mg total) by mouth every 6 (six) hours as needed for mild pain (or Fever >/= 101). Patient not taking: Reported on 08/12/2021 05/20/21   Shon Hale, MD  albuterol (VENTOLIN HFA) 108 (90 Base) MCG/ACT inhaler Inhale 2 puffs into the lungs every 6 (six) hours as needed for wheezing or shortness of breath  (cough). Patient not taking: Reported on 06/26/2021 06/20/21   Shon Hale, MD  baclofen (LIORESAL) 10 MG tablet Take 1 tablet (10 mg total) by mouth 2 (two) times daily. Take 1/2 to 1 pill po tid for Muscle spasm Patient not taking: Reported on 09/21/2021 06/20/21   Shon Hale, MD  cefpodoxime (VANTIN) 200 MG tablet Take 1 tablet (200 mg total) by mouth 2 (two) times daily. Patient not taking: Reported on 08/12/2021 07/03/21   Theron Arista, PA-C  cephALEXin (KEFLEX) 500 MG capsule Take 1 capsule (500 mg total) by mouth 4 (four) times daily. Patient not taking: Reported on 11/04/2021 10/03/21   Al Decant, PA-C  ciprofloxacin (CIPRO) 500 MG tablet Take 1 tablet (500 mg total) by mouth 2 (two) times daily. Patient not taking: Reported on 08/12/2021 07/20/21   Pollyann Savoy, MD  ergocalciferol (VITAMIN D2) 1.25 MG (50000 UT) capsule Take 1 capsule (50,000 Units total) by mouth once a week. Patient not taking: Reported on 10/02/2021 06/20/21   Shon Hale, MD  lactobacillus (FLORANEX/LACTINEX) PACK Take 1 packet (1 g total) by mouth 3 (three) times daily with meals. Patient not taking: Reported on 05/29/2021 05/20/21   Shon Hale, MD  midodrine (PROAMATINE) 10 MG tablet Take 1 tablet (10 mg total) by mouth 3 (three) times daily with meals. To Prevent Low Blood Pressure Patient not taking: Reported on 06/26/2021 06/20/21   Shon Hale, MD  polyvinyl alcohol (LIQUIFILM TEARS) 1.4 % ophthalmic solution Place 1 drop into both eyes as needed for dry eyes. Patient not taking: Reported on 06/26/2021 06/20/21   Shon Hale, MD  senna-docusate (SENOKOT-S) 8.6-50 MG tablet Take 2 tablets by mouth at bedtime. Patient not taking: Reported on 06/26/2021 06/20/21 06/20/22  Shon Hale, MD  traZODone (DESYREL) 50 MG tablet Take 1 tablet (50 mg total) by mouth at bedtime as needed for sleep. Patient not taking: Reported on 06/26/2021 06/20/21   Shon Hale, MD  triamcinolone cream  (KENALOG) 0.1 % Apply 1 Application topically 2 (two) times daily. Patient not taking: Reported on 11/04/2021    [provider]    Physical Exam: BP 118/74 (BP Location: Left Arm)   Pulse 75   Temp 97.9 F (36.6 C)   Resp 17   Ht 5\' 3"  (1.6 m)   Wt 72 kg   LMP 10/05/2021 (Approximate) Comment: states usually irregular  SpO2 100%   BMI 28.12 kg/m   General: 31 y.o. year-old female well developed well nourished in no acute distress.  Alert and oriented x3. HEENT: NCAT, EOMI Neck: Supple, trachea medial Cardiovascular: Regular rate and rhythm with no rubs or gallops.  No thyromegaly or JVD noted.  No lower extremity edema. 2/4 pulses in all 4 extremities. Respiratory: Clear to auscultation with no wheezes or rales. Good inspiratory effort. Abdomen: Soft, nontender nondistended with normal bowel sounds x4 quadrants. Genitourinary: Suprapubic catheter in place. Muskuloskeletal: Mild erythema of plantar aspect of right great toe with mild tenderness to palpation.   Neuro: CN II-XII intact, strength 5/5 x 4, sensation, reflexes intact Skin: No ulcerative lesions noted or rashes Psychiatry: Judgement and  insight appear normal. Mood is appropriate for condition and setting          Labs on Admission:  Basic Metabolic Panel: Recent Labs  Lab 11/04/21 1925  NA 138  K 3.5  CL 106  CO2 27  GLUCOSE 96  BUN 15  CREATININE 0.70  CALCIUM 8.6*   Liver Function Tests: No results for input(s): "AST", "ALT", "ALKPHOS", "BILITOT", "PROT", "ALBUMIN" in the last 168 hours. No results for input(s): "LIPASE", "AMYLASE" in the last 168 hours. No results for input(s): "AMMONIA" in the last 168 hours. CBC: Recent Labs  Lab 11/04/21 1925  WBC 4.6  NEUTROABS 2.4  HGB 12.2  HCT 37.3  MCV 87.1  PLT 227   Cardiac Enzymes: No results for input(s): "CKTOTAL", "CKMB", "CKMBINDEX", "TROPONINI" in the last 168 hours.  BNP (last 3 results) Recent Labs    05/16/21 0420  BNP 297.0*     ProBNP (last 3 results) No results for input(s): "PROBNP" in the last 8760 hours.  CBG: No results for input(s): "GLUCAP" in the last 168 hours.  Radiological Exams on Admission: DG Foot Complete Right  Result Date: 11/04/2021 CLINICAL DATA:  Pain and swelling EXAM: RIGHT FOOT COMPLETE - 3+ VIEW COMPARISON:  06/03/2016 FINDINGS: No recent fracture or dislocation is seen. There are patchy areas of low attenuation in the bony structures, possibly due to osteoporosis. There is no definite focal break in the cortical margins. If there is clinical suspicion for osteomyelitis, follow-up three-phase bone scan or MRI should be considered. Degenerative changes are noted in first metatarsophalangeal joint. IMPRESSION: No recent fracture or dislocation is seen. There are patchy areas of low attenuation in the bony structures which may be due to osteopenia. Possibility of infectious process is not excluded. There is no definite demonstrable break in the cortical margins. If there is clinical suspicion for osteomyelitis, follow-up three-phase bone scan or MRI should be considered. Electronically Signed   By: Ernie Avena M.D.   On: 11/04/2021 18:15    EKG: I independently viewed the EKG done and my findings are as followed: EKG was not done in the ED  Assessment/Plan Present on Admission:  UTI (urinary tract infection)  MS (multiple sclerosis) (HCC)  Principal Problem:   Osteomyelitis (HCC) Active Problems:   MS (multiple sclerosis) (HCC)   UTI (urinary tract infection)  Questionable right great toe osteomyelitis Right foot x-ray was suggestive of osteomyelitis MRI will be done in the morning to confirm this.  UTI POA Urinalysis was positive for UTI Prior urine culture was positive for E. coli (patient was yet to be treated) IV ceftriaxone was started, we shall continue same at this time Urine culture pending  Multiple sclerosis with chronic indwelling Foley catheter Foley catheter  was replaced Continue treatment as described above for UTI   DVT prophylaxis: Lovenox  Code Status: Full code  Consults: None  Family Communication: None at bedside  Severity of Illness: The appropriate patient status for this patient is OBSERVATION. Observation status is judged to be reasonable and necessary in order to provide the required intensity of service to ensure the patient's safety. The patient's presenting symptoms, physical exam findings, and initial radiographic and laboratory data in the context of their medical condition is felt to place them at decreased risk for further clinical deterioration. Furthermore, it is anticipated that the patient will be medically stable for discharge from the hospital within 2 midnights of admission.   Author: Frankey Shown, DO 11/04/2021 11:34 PM  For on  call review [email or web address].

## 2021-11-04 NOTE — ED Notes (Signed)
100cc of urine from new suprapubic catheter.

## 2021-11-04 NOTE — ED Notes (Signed)
Patient given grape juice at this time.

## 2021-11-04 NOTE — ED Triage Notes (Signed)
Patient has suprapubic cathter in place and patient states last cathter placed about 2 weeks ago. Patient has sediment at the end of cathter and catheter is unable to be flushed.

## 2021-11-04 NOTE — ED Provider Notes (Signed)
Patient signed out to me from Berle Mull, PA-C at shift change.  Patient is a 31 year old with a history of MS, has an indwelling suprapubic catheter and was initially here with complaints of leaking around the catheter.  The catheter was replaced and it is clearly draining at this time.  She had been seen on the 17th here during which she also had a catheter change and urine culture grew E. coli but she had not been placed on any antibiotics.  She was given a dose of Rocephin here which was compatible with urine culture results from that visit.  Prior to discharge she mentioned that she had pain at the distal aspect of her right great toe.  It was found to be modestly erythematous, plain film imaging suggesting possible osteomyelitis at the site.  She denies any obvious trauma, but states she often drags this foot while she is moving in her wheelchair.  Additional labs have been obtained, she will require admission for MRI of her foot in the morning to further assess whether this is truly osteomyelitis.    Labs obtained, some still pending at this time.  Discussed with Dr. Thomes Dinning who accepts patient for admission.  He has asked that we order the MRI of her foot for in the morning.   Burgess Amor, PA-C 11/04/21 2244    Vanetta Mulders, MD 11/10/21 [phone]

## 2021-11-04 NOTE — ED Provider Notes (Addendum)
[facility] EMERGENCY DEPARTMENT Provider Note   [name]: [id number] Arrival date & time: 11/04/21  1350     History  Chief Complaint  Patient presents with   Suprapubic Catheter Problem     Melissa West is a 31 y.o. female.  HPI   Medical history including MS with urinary retention suprapubic catheter in place presents with complaints of leaking urinary catheter.  Patient states that she woke up today with urine all around her, family tried to flush her catheter without much success.  She denies any suprapubic pain stomach pains nausea vomiting general body aches.  She has no fevers or chills she has no other complaints.  She does note that she is having some right great toe pain going on for last week, denies any injury to the area  Review patient's chart was seen by myself on the 17th, urinary catheter was replaced without difficulty, cultures were obtained shows that she grew E. coli greater than 100,000, she has not been on any antibiotics.  Home Medications Prior to Admission medications   Medication Sig Start Date End Date Taking? Authorizing Provider  ergocalciferol (VITAMIN D2) 1.25 MG (50000 UT) capsule Take 1 capsule by mouth once a week.   Yes [provider]  KESIMPTA 20 MG/0.4ML SOAJ Inject 20 mg into the skin every 30 (thirty) days. 06/24/21  Yes [provider]  Water For Irrigation, Sterile (STERILE WATER FOR IRRIGATION) Irrigate with 50 mLs as directed daily. Irrigate catheter daily with 50cc of sterile water. 11/03/21  Yes [name], Candee Furbish, MD  acetaminophen (TYLENOL) 325 MG tablet Take 2 tablets (650 mg total) by mouth every 6 (six) hours as needed for mild pain (or Fever >/= 101). Patient not taking: Reported on 08/12/2021 05/20/21   Roxan Hockey, MD  albuterol (VENTOLIN HFA) 108 (90 Base) MCG/ACT inhaler Inhale 2 puffs into the lungs every 6 (six) hours as needed for wheezing or shortness of breath (cough). Patient not taking: Reported on  06/26/2021 06/20/21   Roxan Hockey, MD  baclofen (LIORESAL) 10 MG tablet Take 1 tablet (10 mg total) by mouth 2 (two) times daily. Take 1/2 to 1 pill po tid for Muscle spasm Patient not taking: Reported on 09/21/2021 06/20/21   Roxan Hockey, MD  cefpodoxime (VANTIN) 200 MG tablet Take 1 tablet (200 mg total) by mouth 2 (two) times daily. Patient not taking: Reported on 08/12/2021 07/03/21   Sherrill Raring, PA-C  cephALEXin (KEFLEX) 500 MG capsule Take 1 capsule (500 mg total) by mouth 4 (four) times daily. Patient not taking: Reported on 11/04/2021 10/03/21   Azucena Cecil, PA-C  ciprofloxacin (CIPRO) 500 MG tablet Take 1 tablet (500 mg total) by mouth 2 (two) times daily. Patient not taking: Reported on 08/12/2021 07/20/21   Truddie Hidden, MD  ergocalciferol (VITAMIN D2) 1.25 MG (50000 UT) capsule Take 1 capsule (50,000 Units total) by mouth once a week. Patient not taking: Reported on 10/02/2021 06/20/21   Roxan Hockey, MD  lactobacillus (FLORANEX/LACTINEX) PACK Take 1 packet (1 g total) by mouth 3 (three) times daily with meals. Patient not taking: Reported on 05/29/2021 05/20/21   Roxan Hockey, MD  midodrine (PROAMATINE) 10 MG tablet Take 1 tablet (10 mg total) by mouth 3 (three) times daily with meals. To Prevent Low Blood Pressure Patient not taking: Reported on 06/26/2021 06/20/21   Roxan Hockey, MD  polyvinyl alcohol (LIQUIFILM TEARS) 1.4 % ophthalmic solution Place 1 drop into both eyes as needed for dry eyes. Patient not  taking: Reported on 06/26/2021 06/20/21   Roxan Hockey, MD  senna-docusate (SENOKOT-S) 8.6-50 MG tablet Take 2 tablets by mouth at bedtime. Patient not taking: Reported on 06/26/2021 06/20/21 06/20/22  Roxan Hockey, MD  traZODone (DESYREL) 50 MG tablet Take 1 tablet (50 mg total) by mouth at bedtime as needed for sleep. Patient not taking: Reported on 06/26/2021 06/20/21   Roxan Hockey, MD  triamcinolone cream (KENALOG) 0.1 % Apply 1 Application  topically 2 (two) times daily. Patient not taking: Reported on 11/04/2021    [provider]      Allergies    Patient has no known allergies.    Review of Systems   Review of Systems  Constitutional:  Negative for chills and fever.  Respiratory:  Negative for shortness of breath.   Cardiovascular:  Negative for chest pain.  Gastrointestinal:  Negative for abdominal pain.  Neurological:  Negative for headaches.    Physical Exam Updated Vital Signs BP 90/69   Pulse 86   Temp 98.1 F (36.7 C) (Oral)   Resp 18   Ht 5\' 3"  (1.6 m)   Wt 72 kg   LMP 10/05/2021 (Approximate) Comment: states usually irregular  SpO2 100%   BMI 28.12 kg/m  Physical Exam Vitals and nursing note reviewed. Exam conducted with a chaperone present.  Constitutional:      General: She is not in acute distress.    Appearance: She is not ill-appearing.  HENT:     Head: Normocephalic and atraumatic.     Nose: No congestion.  Eyes:     Conjunctiva/sclera: Conjunctivae normal.  Cardiovascular:     Rate and Rhythm: Normal rate and regular rhythm.     Pulses: Normal pulses.     Heart sounds: No murmur heard.    No friction rub. No gallop.  Pulmonary:     Effort: No respiratory distress.     Breath sounds: No wheezing, rhonchi or rales.  Abdominal:     Palpations: Abdomen is soft.     Tenderness: There is no abdominal tenderness.  Genitourinary:    Comments: Suprapubic catheter in place, no evidence of infection around the area, no urine noted in the bag.  Unable to flush catheter. Musculoskeletal:     Comments: Focused exam of right foot, no evidence of deformity ecchymosis or infection present during my exam, she had point tenderness noted on the distal aspect of her great toe on the palmar dorsum, decent capillary refill, neurovascularly intact  Skin:    General: Skin is warm and dry.  Neurological:     Mental Status: She is alert.  Psychiatric:        Mood and Affect: Mood normal.           ED Results / Procedures / Treatments   Labs (all labs ordered are listed, but only abnormal results are displayed) Labs Reviewed  URINALYSIS, ROUTINE W REFLEX MICROSCOPIC - Abnormal; Notable for the following components:      Result Value   APPearance CLOUDY (*)    Hgb urine dipstick LARGE (*)    Protein, ur >=300 (*)    Nitrite POSITIVE (*)    Leukocytes,Ua LARGE (*)    RBC / HPF >50 (*)    WBC, UA >50 (*)    Bacteria, UA MANY (*)    All other components within normal limits  URINE CULTURE  CULTURE, BLOOD (ROUTINE X 2)  CULTURE, BLOOD (ROUTINE X 2)  CBC WITH DIFFERENTIAL/PLATELET  BASIC METABOLIC PANEL  C-REACTIVE  PROTEIN  SEDIMENTATION RATE  LACTIC ACID, PLASMA  LACTIC ACID, PLASMA    EKG None  Radiology DG Foot Complete Right  Result Date: 11/04/2021 CLINICAL DATA:  Pain and swelling EXAM: RIGHT FOOT COMPLETE - 3+ VIEW COMPARISON:  06/03/2016 FINDINGS: No recent fracture or dislocation is seen. There are patchy areas of low attenuation in the bony structures, possibly due to osteoporosis. There is no definite focal break in the cortical margins. If there is clinical suspicion for osteomyelitis, follow-up three-phase bone scan or MRI should be considered. Degenerative changes are noted in first metatarsophalangeal joint. IMPRESSION: No recent fracture or dislocation is seen. There are patchy areas of low attenuation in the bony structures which may be due to osteopenia. Possibility of infectious process is not excluded. There is no definite demonstrable break in the cortical margins. If there is clinical suspicion for osteomyelitis, follow-up three-phase bone scan or MRI should be considered. Electronically Signed   By: Ernie Avena M.D.   On: 11/04/2021 18:15    Procedures BLADDER CATHETERIZATION  Date/Time: 11/04/2021 7:16 PM  Performed by: Carroll Sage, PA-C Authorized by: Carroll Sage, PA-C   Consent:    Consent obtained:  Verbal    Consent given by:  Patient   Risks, benefits, and alternatives were discussed: yes     Risks discussed:  False passage, infection, urethral injury, incomplete procedure and pain Universal protocol:    Patient identity confirmed:  Verbally with patient Pre-procedure details:    Procedure purpose:  Therapeutic Anesthesia:    Anesthesia method:  None Procedure details:    Provider performed due to:  Nurse unable to complete   Catheter insertion:  Indwelling   Catheter type:  Foley   Catheter size:  16 Fr   Bladder irrigation: yes     Number of attempts:  1   Urine characteristics:  Clear and blood-tinged Post-procedure details:    Procedure completion:  Tolerated well, no immediate complications     Medications Ordered in ED Medications  cefTRIAXone (ROCEPHIN) injection 1 g (1 g Intramuscular Given 11/04/21 1824)  lidocaine (PF) (XYLOCAINE) 1 % injection 5 mL (5 mLs Intradermal Given 11/04/21 1824)    ED Course/ Medical Decision Making/ A&P                           Medical Decision Making Amount and/or Complexity of Data Reviewed Labs: ordered. Radiology: ordered.  Risk Prescription drug management.   This patient presents to the ED for concern of suprapubic difficulty, this involves an extensive number of treatment options, and is a complaint that carries with it a high risk of complications and morbidity.  The differential diagnosis includes UTI, clogged catheter, fracture of the toe    Additional history obtained:  Additional history obtained from N/A External records from outside source obtained and reviewed including previous ED notes   Co morbidities that complicate the patient evaluation  MS  Social Determinants of Health:  Bedbound    Lab Tests:  I Ordered, and personally interpreted labs.  The pertinent results include: UA shows nitrates leukocytes red blood cells white blood cells many bacteria   Imaging Studies ordered:  I ordered imaging  studies including x-ray of right foot I independently visualized and interpreted imaging which showed evidence of possible osteopenia versus muscle osteomyelitis I agree with the radiologist interpretation   Cardiac Monitoring:  The patient was maintained on a cardiac monitor.  I personally viewed and interpreted  the cardiac monitored which showed an underlying rhythm of: N/A   Medicines ordered and prescription drug management:  I ordered medication including ceftriaxone I have reviewed the patients home medicines and have made adjustments as needed  Critical Interventions:  N/A   Reevaluation:  Presents with difficulty with indwelling suprapubic catheter, urine was noted leaking around the area, i.e. change at the suprapubic catheter difficulty with good drainage.  Will obtain UA as well as x-ray of right foot and reassess  Cultures grew E. coli 10 days ago, UA is consistent with UTI, will start on ceftriaxone and likely discharge home with Macrobid  X-ray shows concerns of possible osteomyelitis, on my reexamination her foot the pads on great toe is slightly redder than the pads on her left great toe no I am concerned for possible osteomyelitis, will obtain lab work and reassess likely will need admission for MRI  Consultations Obtained:  N/A    Test Considered:  N/A    Rule out Low suspicion for sepsis as is nontoxic-appearing vital signs reassuring does not meet sepsis or SIRS criteria.  Is no that her blood pressure is soft but this is her baseline generally in the mid to low 90s.  I have low suspicion for fracture dislocation is imaging for this.    Dispostion and problem list  Due to shift change patient be handed off to Burgess Amor, PA-C  Follow-up on lab work, and would recommend admission for observation and obtain MRI of foot tomorrow for exclusion of possible osteomyelitis.  Continue treat patient for UTI sensitive to  ceftriaxone.            Final Clinical Impression(s) / ED Diagnoses Final diagnoses:  Suprapubic catheter dysfunction, initial encounter [facility])  Great toe pain, right    Rx / DC Orders ED Discharge Orders     None         Carroll Sage, PA-C 11/04/21 1906    Carroll Sage, PA-C 11/04/21 1917    Vanetta Mulders, MD 11/10/21 1630

## 2021-11-04 NOTE — ED Notes (Signed)
Area around suprapubic is red and edematous

## 2021-11-05 ENCOUNTER — Observation Stay (HOSPITAL_COMMUNITY): Payer: Medicare Other

## 2021-11-05 ENCOUNTER — Inpatient Hospital Stay (HOSPITAL_COMMUNITY): Payer: Medicare Other

## 2021-11-05 DIAGNOSIS — R600 Localized edema: Secondary | ICD-10-CM | POA: Diagnosis not present

## 2021-11-05 DIAGNOSIS — R279 Unspecified lack of coordination: Secondary | ICD-10-CM | POA: Diagnosis not present

## 2021-11-05 DIAGNOSIS — I9589 Other hypotension: Secondary | ICD-10-CM | POA: Diagnosis present

## 2021-11-05 DIAGNOSIS — Z993 Dependence on wheelchair: Secondary | ICD-10-CM | POA: Diagnosis not present

## 2021-11-05 DIAGNOSIS — M85871 Other specified disorders of bone density and structure, right ankle and foot: Secondary | ICD-10-CM | POA: Diagnosis not present

## 2021-11-05 DIAGNOSIS — N3000 Acute cystitis without hematuria: Secondary | ICD-10-CM | POA: Diagnosis not present

## 2021-11-05 DIAGNOSIS — G35 Multiple sclerosis: Secondary | ICD-10-CM | POA: Diagnosis not present

## 2021-11-05 DIAGNOSIS — R5381 Other malaise: Secondary | ICD-10-CM | POA: Diagnosis not present

## 2021-11-05 DIAGNOSIS — T83090A Other mechanical complication of cystostomy catheter, initial encounter: Secondary | ICD-10-CM | POA: Diagnosis present

## 2021-11-05 DIAGNOSIS — R601 Generalized edema: Secondary | ICD-10-CM | POA: Diagnosis not present

## 2021-11-05 DIAGNOSIS — N319 Neuromuscular dysfunction of bladder, unspecified: Secondary | ICD-10-CM | POA: Diagnosis present

## 2021-11-05 DIAGNOSIS — M86171 Other acute osteomyelitis, right ankle and foot: Secondary | ICD-10-CM | POA: Diagnosis not present

## 2021-11-05 DIAGNOSIS — N390 Urinary tract infection, site not specified: Secondary | ICD-10-CM | POA: Diagnosis present

## 2021-11-05 DIAGNOSIS — Z7401 Bed confinement status: Secondary | ICD-10-CM | POA: Diagnosis not present

## 2021-11-05 DIAGNOSIS — M868X7 Other osteomyelitis, ankle and foot: Secondary | ICD-10-CM | POA: Diagnosis present

## 2021-11-05 DIAGNOSIS — Z87440 Personal history of urinary (tract) infections: Secondary | ICD-10-CM | POA: Diagnosis not present

## 2021-11-05 DIAGNOSIS — Y846 Urinary catheterization as the cause of abnormal reaction of the patient, or of later complication, without mention of misadventure at the time of the procedure: Secondary | ICD-10-CM | POA: Diagnosis present

## 2021-11-05 DIAGNOSIS — Y738 Miscellaneous gastroenterology and urology devices associated with adverse incidents, not elsewhere classified: Secondary | ICD-10-CM | POA: Diagnosis present

## 2021-11-05 DIAGNOSIS — Z79899 Other long term (current) drug therapy: Secondary | ICD-10-CM | POA: Diagnosis not present

## 2021-11-05 DIAGNOSIS — T83518A Infection and inflammatory reaction due to other urinary catheter, initial encounter: Secondary | ICD-10-CM | POA: Diagnosis present

## 2021-11-05 DIAGNOSIS — Z841 Family history of disorders of kidney and ureter: Secondary | ICD-10-CM | POA: Diagnosis not present

## 2021-11-05 DIAGNOSIS — B9620 Unspecified Escherichia coli [E. coli] as the cause of diseases classified elsewhere: Secondary | ICD-10-CM | POA: Diagnosis present

## 2021-11-05 LAB — COMPREHENSIVE METABOLIC PANEL
ALT: 12 U/L (ref 0–44)
AST: 13 U/L — ABNORMAL LOW (ref 15–41)
Albumin: 3 g/dL — ABNORMAL LOW (ref 3.5–5.0)
Alkaline Phosphatase: 47 U/L (ref 38–126)
Anion gap: 4 — ABNORMAL LOW (ref 5–15)
BUN: 13 mg/dL (ref 6–20)
CO2: 24 mmol/L (ref 22–32)
Calcium: 8.3 mg/dL — ABNORMAL LOW (ref 8.9–10.3)
Chloride: 110 mmol/L (ref 98–111)
Creatinine, Ser: 0.52 mg/dL (ref 0.44–1.00)
GFR, Estimated: >60 mL/min (ref >60)
Glucose, Bld: 87 mg/dL (ref 70–99)
Potassium: 3.5 mmol/L (ref 3.5–5.1)
Sodium: 138 mmol/L (ref 135–145)
Total Bilirubin: 0.4 mg/dL (ref 0.3–1.2)
Total Protein: 6 g/dL — ABNORMAL LOW (ref 6.5–8.1)

## 2021-11-05 LAB — APTT: aPTT: 28 seconds (ref 24–36)

## 2021-11-05 LAB — CBC
HCT: 34.8 % — ABNORMAL LOW (ref 36.0–46.0)
Hemoglobin: 11.3 g/dL — ABNORMAL LOW (ref 12.0–15.0)
MCH: 28.3 pg (ref 26.0–34.0)
MCHC: 32.5 g/dL (ref 30.0–36.0)
MCV: 87.2 fL (ref 80.0–100.0)
Platelets: 190 10*3/uL (ref 150–400)
RBC: 3.99 MIL/uL (ref 3.87–5.11)
RDW: 13.5 % (ref 11.5–15.5)
WBC: 4.1 10*3/uL (ref 4.0–10.5)
nRBC: 0 % (ref 0.0–0.2)

## 2021-11-05 LAB — C-REACTIVE PROTEIN: CRP: 0.5 mg/dL (ref <1.0)

## 2021-11-05 MED ORDER — GADOBUTROL 1 MMOL/ML IV SOLN
7.0000 mL | Freq: Once | INTRAVENOUS | Status: AC | PRN
  Administered 2021-11-05: 7 mL via INTRAVENOUS

## 2021-11-05 MED ORDER — MIDODRINE HCL 5 MG PO TABS
5.0000 mg | ORAL_TABLET | Freq: Three times a day (TID) | ORAL | Status: DC
  Administered 2021-11-05 – 2021-11-07 (×6): 5 mg via ORAL
  Filled 2021-11-05 (×7): qty 1

## 2021-11-05 MED ORDER — SODIUM CHLORIDE 0.9 % IV SOLN: INTRAVENOUS | Status: AC

## 2021-11-05 MED ORDER — SENNOSIDES-DOCUSATE SODIUM 8.6-50 MG PO TABS
2.0000 | ORAL_TABLET | Freq: Every day | ORAL | Status: DC
  Administered 2021-11-05 – 2021-11-07 (×3): 2 via ORAL
  Filled 2021-11-05 (×3): qty 2

## 2021-11-05 MED ORDER — SODIUM CHLORIDE 0.9 % IV BOLUS
1000.0000 mL | Freq: Once | INTRAVENOUS | Status: AC
  Administered 2021-11-05: 1000 mL via INTRAVENOUS

## 2021-11-05 MED ORDER — IOHEXOL 300 MG/ML  SOLN
75.0000 mL | Freq: Once | INTRAMUSCULAR | Status: AC | PRN
  Administered 2021-11-05: 75 mL via INTRAVENOUS

## 2021-11-05 NOTE — Progress Notes (Signed)
OT Cancellation Note  Patient Details Name: Melissa West MRN: [id number] DOB: 29-Mar-1991   Cancelled Treatment:    Reason Eval/Treat Not Completed: OT screened, no needs identified, will sign off. Pt reports using mechanical lift at baseline with family assisting with ADL's. No functional change stated. Pt educated to keep w/c foot rest on to prevent foot injury. Pt reported she does not always have the foot support attached. Pt appears to be at baseline and will be removed from OT list.   Danie Chandler OT, MOT  Danie Chandler 11/05/2021, 9:44 AM

## 2021-11-05 NOTE — TOC Progression Note (Signed)
  Transition of Care (TOC) Screening Note   Patient Details  Name: Melissa West Date of Birth: Nov 09, 1990   Transition of Care [facility]) CM/SW Contact:    Leitha Bleak, RN Phone Number: 11/05/2021, 11:01 AM    Transition of Care Department [facility]) has reviewed patient and no TOC needs have been identified at this time. We will continue to monitor patient advancement through interdisciplinary progression rounds. If new patient transition needs arise, please place a TOC consult.      Barriers to Discharge: Continued Medical Work up

## 2021-11-05 NOTE — Plan of Care (Signed)

## 2021-11-05 NOTE — Progress Notes (Signed)
PROGRESS NOTE     Melissa West, is a 31 y.o. female, DOB - Jun 02, 1990, [id number]:[id number]  Admit date - 11/04/2021   Admitting Physician Kynedi Profitt Mariea Clonts, MD  Outpatient Primary MD for the patient is Benita Stabile, MD  LOS - 0  Chief Complaint  Patient presents with   Suprapubic Catheter Problem        Brief Narrative:  31 y.o. female with medical history significant of multiple sclerosis, chronic urinary retention requiring Foley catheter with recurrent UTIs, wheelchair-bound at baseline admitted on 11/04/2021 with concerns for possible UTI as well as possible right great toe infection   -Assessment and Plan: 1)CAUTI--POA--- history of recurrent UTI -Urine culture from 10/26/2021 grew E. Coli--patient was yet to get treatment for this --Patient  has neurogenic bladder with chronic urinary retention due to multiple sclerosis--- she has chronic indwelling suprapubic catheter--POA... -Continue IV Rocephin pending repeat urine culture data  2)Right foot/Great toe pain--- -right great toe exam unremarkable, no erythema , no warmth, no swelling... Very mild tenderness (??  If she hit the right big toe on the wheelchair--however no obvious ecchymosis or other abnormal findings on exam) -No gouty type findings  CT of the right foot without acute findings specifically no osteomyelitis or other infectious concerns  3)MS/Generalized Weakness---Pt reports using mechanical lift at baseline with family assisting with ADL's. No functional change stated. Pt educated to keep w/c foot rest on to prevent foot injury. Pt reported she does not always have the foot support attached. Pt appears to be at baseline and will be removed  -Wheelchair bound at baseline  4)Chronic Hypotension--- previously treated with midodrine -Give IV fluids -Check a.m. cortisol -Will restart midodrine 5 mg tid  Disposition/Need for in-Hospital Stay- patient unable to be discharged at this time due to -- -CAUTI---  requiring IV Rocephin, Soft BP requiring IV fluids  Status is: Inpatient   Disposition: The patient is from: Home              Anticipated d/c is to: Home              Anticipated d/c date is: 2 days              Patient currently is not medically stable to d/c. Barriers: Not Clinically Stable-   Code Status :  -  Code Status: Full Code   Family Communication:    NA (patient is alert, awake and coherent)   DVT Prophylaxis  :   - SCDs   enoxaparin (LOVENOX) injection 40 mg Start: 11/05/21 1000 SCDs Start: 11/04/21 2340   Lab Results  Component Value Date   PLT 190 11/05/2021   Inpatient Medications  Scheduled Meds:  enoxaparin (LOVENOX) injection  40 mg Subcutaneous Q24H   Continuous Infusions:  sodium chloride     cefTRIAXone (ROCEPHIN)  IV 1 g (11/05/21 1037)   sodium chloride     PRN Meds:.acetaminophen **OR** acetaminophen, oxyCODONE   Anti-infectives (From admission, onward)    Start     Dose/Rate Route Frequency Ordered Stop   11/05/21 1000  cefTRIAXone (ROCEPHIN) 1 g in sodium chloride 0.9 % 100 mL IVPB        1 g 200 mL/hr over 30 Minutes Intravenous Every 24 hours 11/04/21 2330     11/04/21 1815  cefTRIAXone (ROCEPHIN) injection 1 g        1 g Intramuscular  Once 11/04/21 1811 11/04/21 1824      Subjective: Melissa West today has no fevers, no  emesis,  No chest pain,   - Complains of right great toe pain  Objective: Vitals:   11/05/21 0303 11/05/21 0721 11/05/21 1057 11/05/21 1406  BP: (!) 90/58 (!) 89/65 (!) 94/55 (!) 86/59  Pulse: 72 76 78 77  Resp: 17     Temp: 98 F (36.7 C)  98.7 F (37.1 C)   TempSrc:   Oral   SpO2: 100% 100% 100% 100%  Weight:      Height:        Intake/Output Summary (Last 24 hours) at 11/05/2021 1432 Last data filed at 11/05/2021 0900 Gross per 24 hour  Intake 120 ml  Output 250 ml  Net -130 ml   Filed Weights   11/04/21 1413 11/05/21 0000  Weight: 72 kg 57.5 kg    Physical Exam  Gen:- Awake Alert,   in no apparent distress  HEENT:- [place].AT, No sclera icterus, reduced visual acuity Neck-Supple Neck,No JVD,.  Lungs-  CTAB , fair symmetrical air movement CV- S1, S2 normal, regular  Abd-  +ve B.Sounds, Abd Soft, No tenderness,    Extremity/Skin:- No  edema, pedal pulses present , right great toe exam unremarkable, no erythema , no warmth, no swelling... Very mild tenderness (??  If she hit the right big toe on the wheelchair--however no obvious ecchymosis or other abnormal findings on exam) -No gouty type findings Psych-affect is appropriate, oriented x3 Neuro-no new focal deficits, no tremors GU-chronic indwelling suprapubic catheter--POA...  Data Reviewed: I have personally reviewed following labs and imaging studies  CBC: Recent Labs  Lab 11/04/21 1925 11/05/21 0600  WBC 4.6 4.1  NEUTROABS 2.4  --   HGB 12.2 11.3*  HCT 37.3 34.8*  MCV 87.1 87.2  PLT 227 190   Basic Metabolic Panel: Recent Labs  Lab 11/04/21 1925 11/05/21 0600  NA 138 138  K 3.5 3.5  CL 106 110  CO2 27 24  GLUCOSE 96 87  BUN 15 13  CREATININE 0.70 0.52  CALCIUM 8.6* 8.3*   GFR: Estimated Creatinine Clearance: 83.5 mL/min (by C-G formula based on SCr of 0.52 mg/dL). Liver Function Tests: Recent Labs  Lab 11/05/21 0600  AST 13*  ALT 12  ALKPHOS 47  BILITOT 0.4  PROT 6.0*  ALBUMIN 3.0*   Cardiac Enzymes: No results for input(s): "CKTOTAL", "CKMB", "CKMBINDEX", "TROPONINI" in the last 168 hours. BNP (last 3 results) No results for input(s): "PROBNP" in the last 8760 hours. HbA1C: No results for input(s): "HGBA1C" in the last 72 hours. Sepsis Labs: @LABRCNTIP (procalcitonin:4,lacticidven:4) ) Recent Results (from the past 240 hour(s))  Blood culture (routine x 2)     Status: None (Preliminary result)   Collection Time: 11/04/21  7:25 PM   Specimen: Left Antecubital; Blood  Result Value Ref Range Status   Specimen Description   Final    LEFT ANTECUBITAL BLOOD Performed at [facility], [address], [place], [place]    Special Requests   Final    BOTTLES DRAWN AEROBIC AND ANAEROBIC Blood Culture results may not be optimal due to an excessive volume of blood received in culture bottles   Culture   Final    NO GROWTH < 12 HOURS Performed at [facility], [address] [place], [place]    Report Status PENDING  Incomplete  Blood culture (routine x 2)     Status: None (Preliminary result)   Collection Time: 11/04/21  8:05 PM   Specimen: BLOOD LEFT ARM  Result Value Ref  Range Status   Specimen Description BLOOD LEFT ARM  Final   Special Requests   Final    BOTTLES DRAWN AEROBIC AND ANAEROBIC Blood Culture adequate volume   Culture   Final    NO GROWTH < 12 HOURS Performed at [facility], [address] [place], [place] [address]    Report Status PENDING  Incomplete      Radiology Studies: CT FOOT RIGHT W CONTRAST  Result Date: 11/05/2021 CLINICAL DATA:  Limping.  Acute foot pain, infection suspected. EXAM: CT OF THE LOWER RIGHT EXTREMITY WITH CONTRAST TECHNIQUE: Multidetector CT imaging of the lower right extremity was performed according to the standard protocol following intravenous contrast administration. RADIATION DOSE REDUCTION: This exam was performed according to the departmental dose-optimization program which includes automated exposure control, adjustment of the mA and/or kV according to patient size and/or use of iterative reconstruction technique. CONTRAST:  66mL OMNIPAQUE IOHEXOL 300 MG/ML  SOLN COMPARISON:  None Available. FINDINGS: Bones/Joint/Cartilage Diffuse osteopenia. No fracture or dislocation. Normal alignment. No joint effusion. Evaluation is somewhat limited due to motion. Ligaments Ligaments are suboptimally evaluated by CT. Muscles and Tendons Muscles are normal in bulk. No intramuscular hematoma or fluid collection. Tendons of the flexor, extensor and peroneal compartments are intact. Screws at Soft tissue Mild  generalized subcutaneous soft tissue edema. No fluid collection or abscess. Skin thickening and skin irregularity about the lateral aspect of the fifth metatarsophalangeal joint, which may represent small wound, clinical correlation is suggested. No fluid collection or hematoma. No soft tissue mass. IMPRESSION: 1. No evidence of fracture or dislocation. No cortical erosion or periosteal reaction to suggest osteomyelitis. 2.  Diffuse osteopenia. 3. No fluid collection or abscess. Mild generalized subcutaneous soft tissue edema. 4. Muscles are normal in bulk. No intramuscular collection. Tendons of the flexor, extensor and peroneal compartment appear intact. Electronically Signed   By: Larose Hires D.O.   On: 11/05/2021 14:14   DG Foot Complete Right  Result Date: 11/04/2021 CLINICAL DATA:  Pain and swelling EXAM: RIGHT FOOT COMPLETE - 3+ VIEW COMPARISON:  06/03/2016 FINDINGS: No recent fracture or dislocation is seen. There are patchy areas of low attenuation in the bony structures, possibly due to osteoporosis. There is no definite focal break in the cortical margins. If there is clinical suspicion for osteomyelitis, follow-up three-phase bone scan or MRI should be considered. Degenerative changes are noted in first metatarsophalangeal joint. IMPRESSION: No recent fracture or dislocation is seen. There are patchy areas of low attenuation in the bony structures which may be due to osteopenia. Possibility of infectious process is not excluded. There is no definite demonstrable break in the cortical margins. If there is clinical suspicion for osteomyelitis, follow-up three-phase bone scan or MRI should be considered. Electronically Signed   By: Ernie Avena M.D.   On: 11/04/2021 18:15    Scheduled Meds:  enoxaparin (LOVENOX) injection  40 mg Subcutaneous Q24H   Continuous Infusions:  sodium chloride     cefTRIAXone (ROCEPHIN)  IV 1 g (11/05/21 1037)   sodium chloride      LOS: 0 days    Shon Hale M.D on 11/05/2021 at 2:32 PM  Go to [email or web address] - for contact info  [facility] - Office  [phone]  If 7PM-7AM, please contact night-coverage [email or web address] 11/05/2021, 2:32 PM

## 2021-11-05 NOTE — Progress Notes (Signed)
PT Cancellation Note  Patient Details Name: Melissa West MRN: [id number] DOB: 12-06-1990   Cancelled Treatment:    Reason Eval/Treat Not Completed: PT screened, no needs identified, will sign off. Consulted with patient. Pt reports using mechanical lift at baseline with family assisting with ADL's. No functional change stated. Contractures of bilateral lower extremities noted. When asked how long it had been since patient had stood, patient reported "a long while" to cues cues of "more or less than one year or 5 years" by therapist.Thank you for the referral.   Britta Mccreedy D. Hartnett-Rands, MS, PT [place] [facility] [phone]  11/05/2021, 1:52 PM

## 2021-11-05 NOTE — Progress Notes (Signed)
Dr. Courage ordered a new foley to be placed (suprapubic cath). Patient stated that a new foley was placed yesterday in the ED. I tried to confirm via secure chat with the RN that took care of patient in ED but no response. I told Dr. Courage that patient stated she had a new foley placed yesterday and was refusing a new one.  

## 2021-11-06 DIAGNOSIS — N3000 Acute cystitis without hematuria: Secondary | ICD-10-CM | POA: Diagnosis not present

## 2021-11-06 DIAGNOSIS — M86171 Other acute osteomyelitis, right ankle and foot: Secondary | ICD-10-CM | POA: Diagnosis not present

## 2021-11-06 LAB — BASIC METABOLIC PANEL
Anion gap: 4 — ABNORMAL LOW (ref 5–15)
BUN: 12 mg/dL (ref 6–20)
CO2: 21 mmol/L — ABNORMAL LOW (ref 22–32)
Calcium: 8.1 mg/dL — ABNORMAL LOW (ref 8.9–10.3)
Chloride: 112 mmol/L — ABNORMAL HIGH (ref 98–111)
Creatinine, Ser: 0.5 mg/dL (ref 0.44–1.00)
GFR, Estimated: >60 mL/min (ref >60)
Glucose, Bld: 86 mg/dL (ref 70–99)
Potassium: 3.8 mmol/L (ref 3.5–5.1)
Sodium: 137 mmol/L (ref 135–145)

## 2021-11-06 LAB — CBC
HCT: 32.1 % — ABNORMAL LOW (ref 36.0–46.0)
Hemoglobin: 10.5 g/dL — ABNORMAL LOW (ref 12.0–15.0)
MCH: 28.8 pg (ref 26.0–34.0)
MCHC: 32.7 g/dL (ref 30.0–36.0)
MCV: 87.9 fL (ref 80.0–100.0)
Platelets: 171 10*3/uL (ref 150–400)
RBC: 3.65 MIL/uL — ABNORMAL LOW (ref 3.87–5.11)
RDW: 13.7 % (ref 11.5–15.5)
WBC: 4 10*3/uL (ref 4.0–10.5)
nRBC: 0 % (ref 0.0–0.2)

## 2021-11-06 LAB — CORTISOL-AM, BLOOD: Cortisol - AM: 10.6 ug/dL (ref 6.7–22.6)

## 2021-11-06 NOTE — Progress Notes (Signed)
PROGRESS NOTE     Melissa West, is a 31 y.o. female, DOB - 01-18-1991, XVQ:[id number]  Admit date - 11/04/2021   Admitting Physician Keona Sheffler Mariea Clonts, MD  Outpatient Primary MD for the patient is Benita Stabile, MD  LOS - 1  Chief Complaint  Patient presents with   Suprapubic Catheter Problem        Brief Narrative:  31 y.o. female with medical history significant of multiple sclerosis, chronic urinary retention requiring Foley catheter with recurrent UTIs, wheelchair-bound at baseline admitted on 11/04/2021 with concerns for possible UTI as well as possible right great toe infection   -Assessment and Plan: 1)CAUTI--POA--- history of recurrent UTI -Urine culture from 10/26/2021 grew E. Coli--patient was yet to get treatment for this --Patient  has neurogenic bladder with chronic urinary retention due to multiple sclerosis--- she has chronic indwelling suprapubic catheter--POA... -Repeat urine culture from 11/04/2021 also growing E. coli sensitivities are pending -Continue IV Rocephin pending repeat urine culture data -Appears that suprapubic catheter was last changed on 11/04/2021  2)Right foot/Great toe pain--- -right great toe exam unremarkable, no erythema , no warmth, no swelling... Very mild tenderness (??  If she hit the right big toe on the wheelchair--however no obvious ecchymosis or other abnormal findings on exam) -No gouty type findings  CT of the right foot without acute findings specifically no osteomyelitis or other infectious concerns  3)MS/Generalized Weakness---Pt reports using mechanical lift at baseline with family assisting with ADL's. No functional change stated. Pt educated to keep w/c foot rest on to prevent foot injury. Pt reported she does not always have the foot support attached. Pt appears to be at baseline and will be removed  -Wheelchair bound at baseline  4)Chronic Hypotension--- previously treated with midodrine -Give IV fluids  a.m. cortisol is  not low -Continue midodrine 5 mg tid  Disposition/Need for in-Hospital Stay- patient unable to be discharged at this time due to -- -CAUTI POA--- requiring IV Rocephin, Soft BP requiring IV fluids -Anticipate discharge in a.m. if sensitivities available  Status is: Inpatient   Disposition: The patient is from: Home              Anticipated d/c is to: Home              Anticipated d/c date is: 1 day              Patient currently is not medically stable to d/c. Barriers: Not Clinically Stable-   Code Status :  -  Code Status: Full Code   Family Communication:    NA (patient is alert, awake and coherent)   DVT Prophylaxis  :   - SCDs   enoxaparin (LOVENOX) injection 40 mg Start: 11/05/21 1000 SCDs Start: 11/04/21 2340   Lab Results  Component Value Date   PLT 171 11/06/2021   Inpatient Medications  Scheduled Meds:  enoxaparin (LOVENOX) injection  40 mg Subcutaneous Q24H   midodrine  5 mg Oral TID WC   senna-docusate  2 tablet Oral QHS   Continuous Infusions:  cefTRIAXone (ROCEPHIN)  IV 1 g (11/06/21 0941)   PRN Meds:.acetaminophen **OR** acetaminophen, oxyCODONE   Anti-infectives (From admission, onward)    Start     Dose/Rate Route Frequency Ordered Stop   11/05/21 1000  cefTRIAXone (ROCEPHIN) 1 g in sodium chloride 0.9 % 100 mL IVPB        1 g 200 mL/hr over 30 Minutes Intravenous Every 24 hours 11/04/21 2330     11/04/21  1815  cefTRIAXone (ROCEPHIN) injection 1 g        1 g Intramuscular  Once 11/04/21 1811 11/04/21 1824      Subjective: Melissa West today has no fevers, no emesis,  No chest pain,   Complains of fatigue and malaise  Objective: Vitals:   11/05/21 2115 11/06/21 0105 11/06/21 0531 11/06/21 1332  BP: (!) 91/59 (!) 88/55 96/65 113/72  Pulse: 68 68 87 74  Resp: 18  16 18   Temp: 98.4 F (36.9 C)  98.1 F (36.7 C) 98.5 F (36.9 C)  TempSrc:   Oral   SpO2: 99%  100% 100%  Weight:      Height:        Intake/Output Summary (Last 24  hours) at 11/06/2021 1940 Last data filed at 11/06/2021 1700 Gross per 24 hour  Intake 1430.59 ml  Output 1550 ml  Net -119.41 ml   Filed Weights   11/04/21 1413 11/05/21 0000  Weight: 72 kg 57.5 kg    Physical Exam  Gen:- Awake Alert,  in no apparent distress  HEENT:- [place].AT, No sclera icterus, reduced visual acuity Neck-Supple Neck,No JVD,.  Lungs-  CTAB , fair symmetrical air movement CV- S1, S2 normal, regular  Abd-  +ve B.Sounds, Abd Soft, No tenderness, suprapubic catheter Extremity/Skin:- No  edema, pedal pulses present , right great toe exam unremarkable, no erythema , no warmth, no swelling... Very mild tenderness (??  If she hit the right big toe on the wheelchair--however no obvious ecchymosis or other abnormal findings on exam) -No gouty type findings Psych-affect is appropriate, oriented x3 Neuro-no new focal deficits, no tremors GU-chronic indwelling suprapubic catheter--POA...  Data Reviewed: I have personally reviewed following labs and imaging studies  CBC: Recent Labs  Lab 11/04/21 1925 11/05/21 0600 11/06/21 0549  WBC 4.6 4.1 4.0  NEUTROABS 2.4  --   --   HGB 12.2 11.3* 10.5*  HCT 37.3 34.8* 32.1*  MCV 87.1 87.2 87.9  PLT 227 190 171   Basic Metabolic Panel: Recent Labs  Lab 11/04/21 1925 11/05/21 0600 11/06/21 0549  NA 138 138 137  K 3.5 3.5 3.8  CL 106 110 112*  CO2 27 24 21*  GLUCOSE 96 87 86  BUN 15 13 12   CREATININE 0.70 0.52 0.50  CALCIUM 8.6* 8.3* 8.1*   GFR: Estimated Creatinine Clearance: 83.5 mL/min (by C-G formula based on SCr of 0.5 mg/dL). Liver Function Tests: Recent Labs  Lab 11/05/21 0600  AST 13*  ALT 12  ALKPHOS 47  BILITOT 0.4  PROT 6.0*  ALBUMIN 3.0*   Cardiac Enzymes: No results for input(s): "CKTOTAL", "CKMB", "CKMBINDEX", "TROPONINI" in the last 168 hours. BNP (last 3 results) No results for input(s): "PROBNP" in the last 8760 hours. HbA1C: No results for input(s): "HGBA1C" in the last 72 hours. Sepsis  Labs: @LABRCNTIP (procalcitonin:4,lacticidven:4) ) Recent Results (from the past 240 hour(s))  Urine Culture     Status: Abnormal (Preliminary result)   Collection Time: 11/04/21  5:21 PM   Specimen: Urine, Suprapubic  Result Value Ref Range Status   Specimen Description   Final    URINE, SUPRAPUBIC Performed at [facility], [address], [place], [facility] [address]    Special Requests   Final    NONE Performed at [facility], [address] [place], [facility] [address]    Culture >=100,000 COLONIES/mL ESCHERICHIA COLI (A)  Final   Report Status PENDING  Incomplete  Blood culture (routine x 2)     Status:  None (Preliminary result)   Collection Time: 11/04/21  7:25 PM   Specimen: Left Antecubital; Blood  Result Value Ref Range Status   Specimen Description   Final    LEFT ANTECUBITAL BLOOD Performed at [facility], [address] [place], [place] [address]    Special Requests   Final    BOTTLES DRAWN AEROBIC AND ANAEROBIC Blood Culture results may not be optimal due to an excessive volume of blood received in culture bottles   Culture   Final    NO GROWTH 2 DAYS Performed at [facility], [address] [place], [place] [address]    Report Status PENDING  Incomplete  Blood culture (routine x 2)     Status: None (Preliminary result)   Collection Time: 11/04/21  8:05 PM   Specimen: BLOOD LEFT ARM  Result Value Ref Range Status   Specimen Description BLOOD LEFT ARM  Final   Special Requests   Final    BOTTLES DRAWN AEROBIC AND ANAEROBIC Blood Culture adequate volume   Culture   Final    NO GROWTH 2 DAYS Performed at [facility], [address] [place], [place] [address]    Report Status PENDING  Incomplete      Radiology Studies: CT FOOT RIGHT W CONTRAST  Result Date: 11/05/2021 CLINICAL DATA:  [place].  Acute foot pain, infection suspected. EXAM: CT OF THE LOWER RIGHT EXTREMITY WITH CONTRAST TECHNIQUE: Multidetector CT imaging of the lower right extremity was  performed according to the standard protocol following intravenous contrast administration. RADIATION DOSE REDUCTION: This exam was performed according to the departmental dose-optimization program which includes automated exposure control, adjustment of the mA and/or kV according to patient size and/or use of iterative reconstruction technique. CONTRAST:  58mL OMNIPAQUE IOHEXOL 300 MG/ML  SOLN COMPARISON:  None Available. FINDINGS: Bones/Joint/Cartilage Diffuse osteopenia. No fracture or dislocation. Normal alignment. No joint effusion. Evaluation is somewhat limited due to motion. Ligaments Ligaments are suboptimally evaluated by CT. Muscles and Tendons Muscles are normal in bulk. No intramuscular hematoma or fluid collection. Tendons of the flexor, extensor and peroneal compartments are intact. Screws at Soft tissue Mild generalized subcutaneous soft tissue edema. No fluid collection or abscess. Skin thickening and skin irregularity about the lateral aspect of the fifth metatarsophalangeal joint, which may represent small wound, clinical correlation is suggested. No fluid collection or hematoma. No soft tissue mass. IMPRESSION: 1. No evidence of fracture or dislocation. No cortical erosion or periosteal reaction to suggest osteomyelitis. 2.  Diffuse osteopenia. 3. No fluid collection or abscess. Mild generalized subcutaneous soft tissue edema. 4. Muscles are normal in bulk. No intramuscular collection. Tendons of the flexor, extensor and peroneal compartment appear intact. Electronically Signed   By: Larose Hires D.O.   On: 11/05/2021 14:14    Scheduled Meds:  enoxaparin (LOVENOX) injection  40 mg Subcutaneous Q24H   midodrine  5 mg Oral TID WC   senna-docusate  2 tablet Oral QHS   Continuous Infusions:  cefTRIAXone (ROCEPHIN)  IV 1 g (11/06/21 0941)    LOS: 1 day    Shon Hale M.D on 11/06/2021 at 7:40 PM  Go to [email or web address] - for contact info  [facility] - Office   [phone]  If 7PM-7AM, please contact night-coverage [email or web address] 11/06/2021, 7:40 PM

## 2021-11-06 NOTE — Plan of Care (Signed)
  Problem: Education: Goal: Knowledge of General Education information will improve Description Including pain rating scale, medication(s)/side effects and non-pharmacologic comfort measures Outcome: Progressing   Problem: Health Behavior/Discharge Planning: Goal: Ability to manage health-related needs will improve Outcome: Progressing   

## 2021-11-06 NOTE — TOC Initial Note (Signed)
Transition of Care [facility]) - Initial/Assessment Note    Patient Details  Name: Melissa West MRN: [id number] Date of Birth: [birth year]  Transition of Care [facility]) CM/SW Contact:    Leitha Bleak, RN Phone Number: 11/06/2021, 1:08 PM  Clinical Narrative:  Patient admitted with osteomyelitis. TOC spoke with patient, she is not currently active with any home health agencies. She states she had rather go to out patient PT to use their equipment if that is needed. She uses EMS transportation to get home. For appointments she schedules her transportation with the company that will take her to the area needed. Planning to DC home tomorrow, no new needs.                  Expected Discharge Plan: Home/Self Care Barriers to Discharge: Continued Medical Work up   Patient Goals and CMS Choice Patient states their goals for this hospitalization and ongoing recovery are:: to go home. CMS Medicare.gov Compare Post Acute Care list provided to:: Patient Choice offered to / list presented to : Patient  Expected Discharge Plan and Services Expected Discharge Plan: Home/Self Care     Post Acute Care Choice: Durable Medical Equipment Living arrangements for the past 2 months: Single Family Home                    Prior Living Arrangements/Services Living arrangements for the past 2 months: Single Family Home Lives with:: Parents Patient language and need for interpreter reviewed:: Yes        Need for Family Participation in Patient Care: Yes (Comment) Care giver support system in place?: Yes (comment) Current home services: DME Criminal Activity/Legal Involvement Pertinent to Current Situation/Hospitalization: No - Comment as needed  Activities of Daily Living Home Assistive Devices/Equipment: Nurse, adult, Hospital bed, Shower chair with back ADL Screening (condition at time of admission) Patient's cognitive ability adequate to safely complete daily activities?: No Is the patient deaf or  have difficulty hearing?: No Does the patient have difficulty seeing, even when wearing glasses/contacts?: No Does the patient have difficulty concentrating, remembering, or making decisions?: No Patient able to express need for assistance with ADLs?: Yes Does the patient have difficulty dressing or bathing?: Yes Independently performs ADLs?: Yes (appropriate for developmental age) Does the patient have difficulty walking or climbing stairs?: Yes Weakness of Legs: Both Weakness of Arms/Hands: Both   Emotional Assessment   Attitude/Demeanor/Rapport: Engaged Affect (typically observed): Accepting Orientation: : Oriented to Self, Oriented to Place, Oriented to  Time, Oriented to Situation Alcohol / Substance Use: Not Applicable Psych Involvement: No (comment)  Admission diagnosis:  UTI (urinary tract infection) [N39.0] Suprapubic catheter dysfunction, initial encounter (HCC) [T83.010A] Great toe pain, right [L24.401] Patient Active Problem List   Diagnosis Date Noted   UTI (urinary tract infection) 11/04/2021   Osteomyelitis (HCC) 11/04/2021   Sepsis (HCC) 06/17/2021   Chronic anemia 05/20/2021   Abnormal finding of diagnostic imaging--Abnormal CT AP 05/19/2021   Bacteremia 05/17/2021    Class: Acute   Chest pain 05/16/2021   Hypotension 05/15/2021    Class: Acute   Sepsis secondary to UTI (HCC) 05/14/2021   Leukocytosis 03/08/2021   Acute on chronic urinary retention 03/08/2021   Pressure injury of skin 03/08/2021   AKI (acute kidney injury) (HCC) 03/07/2021   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   Ataxia 11/29/2018   Nystagmus 11/29/2018   MS (multiple sclerosis) (HCC)    PCP:  Benita Stabile, MD Pharmacy:   [facility] - [place], [place] -  [address]  [place] [address] Phone: [phone] Fax: [phone]  Readmission Risk Interventions    11/06/2021    1:05 PM 05/20/2021   10:58 AM 03/11/2021    2:08 PM  Readmission Risk Prevention Plan   Post Dischage Appt   Complete  Medication Screening   Complete  Transportation Screening Complete  Complete  Home Care Screening  Complete   Medication Review (RN CM)  Complete   Medication Review (RN Care Manager) Complete    PCP or Specialist appointment within 3-5 days of discharge Not Complete    HRI or Home Care Consult Complete    SW Recovery Care/Counseling Consult Complete    Palliative Care Screening Not Applicable    Skilled Nursing Facility Not Applicable

## 2021-11-07 DIAGNOSIS — N3000 Acute cystitis without hematuria: Secondary | ICD-10-CM | POA: Diagnosis not present

## 2021-11-07 DIAGNOSIS — G35 Multiple sclerosis: Secondary | ICD-10-CM | POA: Diagnosis not present

## 2021-11-07 DIAGNOSIS — M86171 Other acute osteomyelitis, right ankle and foot: Secondary | ICD-10-CM | POA: Diagnosis not present

## 2021-11-07 LAB — URINE CULTURE: Culture: >=100000 — AB

## 2021-11-07 MED ORDER — STERILE WATER FOR IRRIGATION IR SOLN: 50.0000 mL | Freq: Every day | 15 refills | Status: DC

## 2021-11-07 MED ORDER — ERGOCALCIFEROL 1.25 MG (50000 UT) PO CAPS: 50000.0000 [IU] | ORAL_CAPSULE | ORAL | 5 refills | Status: DC

## 2021-11-07 MED ORDER — MIDODRINE HCL 5 MG PO TABS: 5.0000 mg | ORAL_TABLET | Freq: Three times a day (TID) | ORAL | 3 refills | Status: DC

## 2021-11-07 MED ORDER — SENNOSIDES-DOCUSATE SODIUM 8.6-50 MG PO TABS: 2.0000 | ORAL_TABLET | Freq: Every day | ORAL | 11 refills | Status: DC

## 2021-11-07 MED ORDER — CEPHALEXIN 500 MG PO CAPS
500.0000 mg | ORAL_CAPSULE | Freq: Three times a day (TID) | ORAL | 0 refills | Status: AC
Start: 2021-11-07 — End: 2021-11-12

## 2021-11-07 NOTE — Plan of Care (Signed)
  Problem: Clinical Measurements: Goal: Ability to maintain clinical measurements within normal limits will improve Outcome: Progressing   

## 2021-11-07 NOTE — Plan of Care (Signed)

## 2021-11-07 NOTE — Discharge Summary (Signed)
Melissa West, is a 31 y.o. female  DOB 1990/07/04  MRN [id number].  Admission date:  11/04/2021  Admitting Physician  Shon Hale, MD  Discharge Date:  11/07/2021   Primary MD  Benita Stabile, MD  Recommendations for primary care physician for things to follow:   1) your suprapubic catheter was changed on 11/04/2021--- it needs to be changed at least once a month 2) please note that there has been a few changes to your medications 3) outpatient physical therapy to help you get stronger as discussed  Admission Diagnosis  UTI (urinary tract infection) [N39.0] Suprapubic catheter dysfunction, initial encounter (HCC) [T83.010A] Great toe pain, right [Z32.992]   Discharge Diagnosis  UTI (urinary tract infection) [N39.0] Suprapubic catheter dysfunction, initial encounter (HCC) [T83.010A] Great toe pain, right [E26.834]    Principal Problem:   Osteomyelitis (HCC) Active Problems:   MS (multiple sclerosis) (HCC)   UTI (urinary tract infection)      Past Medical History:  Diagnosis Date   MS (multiple sclerosis) (HCC)    No pertinent past medical history     Past Surgical History:  Procedure Laterality Date   CESAREAN SECTION  08/09/2011   Procedure: CESAREAN SECTION;  Surgeon: Lesly Dukes, MD;  Location: [facility];  Service: Gynecology;  Laterality: N/A;   IR CATHETER TUBE CHANGE  07/22/2021   IR CATHETER TUBE CHANGE  09/10/2021       HPI  from the history and physical done on the day of admission:   HPI: Melissa West is a 31 y.o. female with medical history significant of multiple sclerosis, chronic urinary retention requiring Foley catheter with recurrent UTIs, wheelchair-bound at baseline who presents to the emergency department due to leaking urinary catheter.  Patient states that she woke up this morning with leaked urine from the catheter all around her, family thought it was  due to clogged catheter, so an attempt was made to flush the catheter without any success.  She also complained of right great toe intermittent pain which started several days ago, she states that oftentimes while on wheelchair, foot drags on the floor.  She denies fever, chills, suprapubic pain, nausea, vomiting, headache. Patient was recently seen in the ED on 7/17 with similar presentation, urinary catheter was replaced without difficulty at that time.  Urine culture obtained grew E. Coli > 100,000 and patient is yet to be treated for this.    ED Course:  In the emergency department, she was hemodynamically stable, BP was 105/79, other vital signs are within normal range.  Work-up in the ED showed normal CBC and BMP, lactic acid 1.4  > 0.7, sed rate 13.  Urinalysis was positive for nitrite, large leukocytes, RBC and WBC > 50, proteinuria and many bacteria.  Blood culture pending. Right foot x-ray showed no recent fracture or dislocation is seen. There are patchy areas of low attenuation in the bony structures which may be due to osteopenia. Possibility of infectious process is not excluded. There is no definite demonstrable break in  the cortical margins. If there is clinical suspicion for osteomyelitis, follow-up three-phase bone scan or MRI should be considered. She was treated with IV ceftriaxone for UTI.  Hospitalist was asked to admit patient for further evaluation and management.     Hospital Course:   Brief Narrative:  31 y.o. female with medical history significant of multiple sclerosis, chronic urinary retention requiring Foley catheter with recurrent UTIs, wheelchair-bound at baseline admitted on 11/04/2021 with concerns for possible UTI as well as possible right great toe infection   -Assessment and Plan: 1)CAUTI--POA--- history of recurrent CAUTI -Urine culture from 10/26/2021 grew E. Coli--patient was yet to get treatment for this --Patient  has neurogenic bladder with chronic  urinary retention due to multiple sclerosis--- she has chronic indwelling suprapubic catheter--POA... -Repeat urine culture from 11/04/2021 also growing E. coli  -Treated with IV Rocephin  -Okay to discharge home on p.o. Keflex -Appears that suprapubic catheter was last changed on 11/04/2021   2)Right foot/Great toe pain--- -right great toe exam unremarkable, no erythema , no warmth, no swelling... Very mild tenderness (??  If she hit the right big toe on the wheelchair--however no obvious ecchymosis or other abnormal findings on exam) -No gouty type findings  CT of the right foot without acute findings specifically no osteomyelitis or other infectious concerns   3)MS/Generalized Weakness---Pt reports using mechanical lift at baseline with family assisting with ADL's. No functional change stated. Pt educated to keep w/c foot rest on to prevent foot injury. Pt reported she does not always have the foot support attached. Pt appears to be at baseline and will be removed  -Wheelchair bound at baseline -Outpatient physical therapy requested   4)Chronic Hypotension--- previously treated with midodrine  a.m. cortisol is not low -Compliance with midodrine 5 mg tid strongly advised   Disposition--- Home  Disposition: The patient is from: Home              Anticipated d/c is to: Home  Discharge Condition: stable   Follow UP--- PCP and urologist as advised   Diet and Activity recommendation:  As advised  Discharge Instructions    Discharge Instructions     Ambulatory referral to Physical Therapy   Complete by: As directed    Call MD for:  difficulty breathing, headache or visual disturbances   Complete by: As directed    Call MD for:  persistant dizziness or light-headedness   Complete by: As directed    Call MD for:  persistant nausea and vomiting   Complete by: As directed    Call MD for:  temperature >100.4   Complete by: As directed    Diet general   Complete by: As directed     Discharge instructions   Complete by: As directed    1) your suprapubic catheter was changed on 11/04/2021--- it needs to be changed at least once a month 2) please note that there has been a few changes to your medications 3) outpatient physical therapy to help you get stronger as discussed   Increase activity slowly   Complete by: As directed          Discharge Medications     Allergies as of 11/07/2021   No Known Allergies      Medication List     STOP taking these medications    albuterol 108 (90 Base) MCG/ACT inhaler Commonly known as: VENTOLIN HFA   baclofen 10 MG tablet Commonly known as: LIORESAL   cefpodoxime 200 MG tablet Commonly known  as: VANTIN   ciprofloxacin 500 MG tablet Commonly known as: CIPRO   lactobacillus Pack   traZODone 50 MG tablet Commonly known as: DESYREL   triamcinolone cream 0.1 % Commonly known as: KENALOG       TAKE these medications    acetaminophen 325 MG tablet Commonly known as: TYLENOL Take 2 tablets (650 mg total) by mouth every 6 (six) hours as needed for mild pain (or Fever >/= 101).   cephALEXin 500 MG capsule Commonly known as: Keflex Take 1 capsule (500 mg total) by mouth 3 (three) times daily for 5 days. What changed: when to take this   ergocalciferol 1.25 MG (50000 UT) capsule Commonly known as: VITAMIN D2 Take 1 capsule (50,000 Units total) by mouth once a week. What changed: Another medication with the same name was removed. Continue taking this medication, and follow the directions you see here.   Kesimpta 20 MG/0.4ML Soaj Generic drug: Ofatumumab Inject 20 mg into the skin every 30 (thirty) days.   midodrine 5 MG tablet Commonly known as: PROAMATINE Take 1 tablet (5 mg total) by mouth 3 (three) times daily with meals. To Help Prevent your Blood Pressure from dropping What changed:  medication strength how much to take additional instructions   polyvinyl alcohol 1.4 % ophthalmic  solution Commonly known as: LIQUIFILM TEARS Place 1 drop into both eyes as needed for dry eyes.   senna-docusate 8.6-50 MG tablet Commonly known as: Senokot-S Take 2 tablets by mouth at bedtime.   sterile water for irrigation Irrigate with 50 mLs as directed daily. Irrigate catheter daily with 50cc of sterile water.        Major procedures and Radiology Reports - PLEASE review detailed and final reports for all details, in brief -   CT FOOT RIGHT W CONTRAST  Result Date: 11/05/2021 CLINICAL DATA:  Limping.  Acute foot pain, infection suspected. EXAM: CT OF THE LOWER RIGHT EXTREMITY WITH CONTRAST TECHNIQUE: Multidetector CT imaging of the lower right extremity was performed according to the standard protocol following intravenous contrast administration. RADIATION DOSE REDUCTION: This exam was performed according to the departmental dose-optimization program which includes automated exposure control, adjustment of the mA and/or kV according to patient size and/or use of iterative reconstruction technique. CONTRAST:  32mL OMNIPAQUE IOHEXOL 300 MG/ML  SOLN COMPARISON:  None Available. FINDINGS: Bones/Joint/Cartilage Diffuse osteopenia. No fracture or dislocation. Normal alignment. No joint effusion. Evaluation is somewhat limited due to motion. Ligaments Ligaments are suboptimally evaluated by CT. Muscles and Tendons Muscles are normal in bulk. No intramuscular hematoma or fluid collection. Tendons of the flexor, extensor and peroneal compartments are intact. Screws at Soft tissue Mild generalized subcutaneous soft tissue edema. No fluid collection or abscess. Skin thickening and skin irregularity about the lateral aspect of the fifth metatarsophalangeal joint, which may represent small wound, clinical correlation is suggested. No fluid collection or hematoma. No soft tissue mass. IMPRESSION: 1. No evidence of fracture or dislocation. No cortical erosion or periosteal reaction to suggest  osteomyelitis. 2.  Diffuse osteopenia. 3. No fluid collection or abscess. Mild generalized subcutaneous soft tissue edema. 4. Muscles are normal in bulk. No intramuscular collection. Tendons of the flexor, extensor and peroneal compartment appear intact. Electronically Signed   By: Larose Hires D.O.   On: 11/05/2021 14:14   DG Foot Complete Right  Result Date: 11/04/2021 CLINICAL DATA:  Pain and swelling EXAM: RIGHT FOOT COMPLETE - 3+ VIEW COMPARISON:  06/03/2016 FINDINGS: No recent fracture or dislocation is seen. There  are patchy areas of low attenuation in the bony structures, possibly due to osteoporosis. There is no definite focal break in the cortical margins. If there is clinical suspicion for osteomyelitis, follow-up three-phase bone scan or MRI should be considered. Degenerative changes are noted in first metatarsophalangeal joint. IMPRESSION: No recent fracture or dislocation is seen. There are patchy areas of low attenuation in the bony structures which may be due to osteopenia. Possibility of infectious process is not excluded. There is no definite demonstrable break in the cortical margins. If there is clinical suspicion for osteomyelitis, follow-up three-phase bone scan or MRI should be considered. Electronically Signed   By: Elmer Picker M.D.   On: 11/04/2021 18:15    Micro Results  Recent Results (from the past 240 hour(s))  Urine Culture     Status: Abnormal   Collection Time: 11/04/21  5:21 PM   Specimen: Urine, Suprapubic  Result Value Ref Range Status   Specimen Description   Final    URINE, SUPRAPUBIC Performed at [facility], [address] [place], [place] [address]    Special Requests   Final    NONE Performed at [facility], [address], [place], [place] [address]    Culture >=100,000 COLONIES/mL ESCHERICHIA COLI (A)  Final   Report Status 11/07/2021 FINAL  Final   Organism ID, Bacteria ESCHERICHIA COLI (A)  Final      Susceptibility   Escherichia coli -  MIC*    AMPICILLIN 16 INTERMEDIATE Intermediate     CEFAZOLIN <=4 SENSITIVE Sensitive     CEFEPIME <=0.12 SENSITIVE Sensitive     CEFTRIAXONE <=0.25 SENSITIVE Sensitive     CIPROFLOXACIN >=4 RESISTANT Resistant     GENTAMICIN <=1 SENSITIVE Sensitive     IMIPENEM <=0.25 SENSITIVE Sensitive     NITROFURANTOIN <=16 SENSITIVE Sensitive     TRIMETH/SULFA <=20 SENSITIVE Sensitive     AMPICILLIN/SULBACTAM 4 SENSITIVE Sensitive     PIP/TAZO <=4 SENSITIVE Sensitive     * >=100,000 COLONIES/mL ESCHERICHIA COLI  Blood culture (routine x 2)     Status: None (Preliminary result)   Collection Time: 11/04/21  7:25 PM   Specimen: Left Antecubital; Blood  Result Value Ref Range Status   Specimen Description   Final    LEFT ANTECUBITAL BLOOD Performed at [facility], [place] [address], [place], [place] [address]    Special Requests   Final    BOTTLES DRAWN AEROBIC AND ANAEROBIC Blood Culture results may not be optimal due to an excessive volume of blood received in culture bottles   Culture   Final    NO GROWTH 3 DAYS Performed at [facility], [address], [place], [place] [address]    Report Status PENDING  Incomplete  Blood culture (routine x 2)     Status: None (Preliminary result)   Collection Time: 11/04/21  8:05 PM   Specimen: BLOOD LEFT ARM  Result Value Ref Range Status   Specimen Description BLOOD LEFT ARM  Final   Special Requests   Final    BOTTLES DRAWN AEROBIC AND ANAEROBIC Blood Culture adequate volume   Culture   Final    NO GROWTH 3 DAYS Performed at [facility], [address], [place], [place] [address]    Report Status PENDING  Incomplete    Today   Subjective    Melissa West today has no new complaints  - Aunt at bedside -No fevers no chills no vomiting or diarrhea  Patient has been seen and examined prior to discharge   Objective   Blood pressure 108/68, pulse 79, temperature 98.7 F (37.1 C), resp. rate 18, height 5\' 3"  (1.6 m),  weight 57.5 kg, last menstrual period 10/05/2021, SpO2 100 %.   Intake/Output Summary (Last 24 hours) at 11/07/2021 1243 Last data filed at 11/07/2021 0451 Gross per 24 hour  Intake 220 ml  Output 2200 ml  Net -1980 ml    Exam Gen:- Awake Alert,  in no apparent distress  HEENT:- [place].AT, No sclera icterus, reduced visual acuity Neck-Supple Neck,No JVD,.  Lungs-  CTAB , fair symmetrical air movement CV- S1, S2 normal, regular  Abd-  +ve B.Sounds, Abd Soft, No tenderness, suprapubic catheter in situ Extremity/Skin:- No  edema, pedal pulses present , right great toe exam unremarkable, no erythema , no warmth, no swelling... No significant tenderness (??  If she hit the right big toe on the wheelchair--however no obvious ecchymosis or other abnormal findings on exam) -No gouty type findings Psych-affect is appropriate, oriented x3 Neuro-generalized weakness and deconditioning , old neuromuscular deficits due to multiple sclerosis but no new/additional focal deficits, no tremors GU-chronic indwelling suprapubic catheter--POA...   Data Review   CBC w Diff:  Lab Results  Component Value Date   WBC 4.0 11/06/2021   HGB 10.5 (L) 11/06/2021   HGB 13.2 05/13/2021   HCT 32.1 (L) 11/06/2021   HCT 40.2 05/13/2021   PLT 171 11/06/2021   PLT 260 05/13/2021   LYMPHOPCT 39 11/04/2021   BANDSPCT 9 05/14/2021   MONOPCT 5 11/04/2021   EOSPCT 2 11/04/2021   BASOPCT 0 11/04/2021    CMP:  Lab Results  Component Value Date   NA 137 11/06/2021   NA 143 05/13/2021   K 3.8 11/06/2021   CL 112 (H) 11/06/2021   CO2 21 (L) 11/06/2021   BUN 12 11/06/2021   BUN 14 05/13/2021   CREATININE 0.50 11/06/2021   PROT 6.0 (L) 11/05/2021   PROT 7.8 05/13/2021   ALBUMIN 3.0 (L) 11/05/2021   ALBUMIN 4.2 05/13/2021   BILITOT 0.4 11/05/2021   BILITOT <0.2 05/13/2021   ALKPHOS 47 11/05/2021   AST 13 (L) 11/05/2021   ALT 12 11/05/2021  .  Total Discharge time is about 33 minutes  Roxan Hockey M.D  on 11/07/2021 at 12:43 PM  Go to [email or web address] -  for contact info  [facility] - Office  [phone]

## 2021-11-07 NOTE — Progress Notes (Signed)
CSW spoke with dispatch at [facility] to schedule transport for patient to return home. Dispatch states it will be a while before they can transport patient home as there are other patient's on the list.  Edwin Dada, MSW, LCSW Transitions of Care  Clinical Social Worker II [phone]

## 2021-11-07 NOTE — Progress Notes (Signed)
Nsg Discharge Note  Admit Date:  11/04/2021 Discharge date: 11/07/2021   Envy Meno to be D/C'd Home per MD order.  AVS completed.  Patient/caregiver able to verbalize understanding.  Discharge Medication: Allergies as of 11/07/2021   No Known Allergies      Medication List     STOP taking these medications    albuterol 108 (90 Base) MCG/ACT inhaler Commonly known as: VENTOLIN HFA   baclofen 10 MG tablet Commonly known as: LIORESAL   cefpodoxime 200 MG tablet Commonly known as: VANTIN   ciprofloxacin 500 MG tablet Commonly known as: CIPRO   lactobacillus Pack   traZODone 50 MG tablet Commonly known as: DESYREL   triamcinolone cream 0.1 % Commonly known as: KENALOG       TAKE these medications    acetaminophen 325 MG tablet Commonly known as: TYLENOL Take 2 tablets (650 mg total) by mouth every 6 (six) hours as needed for mild pain (or Fever >/= 101).   cephALEXin 500 MG capsule Commonly known as: Keflex Take 1 capsule (500 mg total) by mouth 3 (three) times daily for 5 days. What changed: when to take this   ergocalciferol 1.25 MG (50000 UT) capsule Commonly known as: VITAMIN D2 Take 1 capsule (50,000 Units total) by mouth once a week. What changed: Another medication with the same name was removed. Continue taking this medication, and follow the directions you see here.   Kesimpta 20 MG/0.4ML Soaj Generic drug: Ofatumumab Inject 20 mg into the skin every 30 (thirty) days.   midodrine 5 MG tablet Commonly known as: PROAMATINE Take 1 tablet (5 mg total) by mouth 3 (three) times daily with meals. To Help Prevent your Blood Pressure from dropping What changed:  medication strength how much to take additional instructions   polyvinyl alcohol 1.4 % ophthalmic solution Commonly known as: LIQUIFILM TEARS Place 1 drop into both eyes as needed for dry eyes.   senna-docusate 8.6-50 MG tablet Commonly known as: Senokot-S Take 2 tablets by mouth  at bedtime.   sterile water for irrigation Irrigate with 50 mLs as directed daily. Irrigate catheter daily with 50cc of sterile water.        Discharge Assessment: Vitals:   11/06/21 2047 11/07/21 0450  BP: 97/67 108/68  Pulse: 64 79  Resp: 18 18  Temp: 98.3 F (36.8 C) 98.7 F (37.1 C)  SpO2: 99% 100%   Skin clean, dry and intact without evidence of skin break down, no evidence of skin tears noted. IV catheter discontinued intact. Site without signs and symptoms of complications - no redness or edema noted at insertion site, patient denies c/o pain - only slight tenderness at site.  Dressing with slight pressure applied.  D/c Instructions-Education: Discharge instructions given to patient/family with verbalized understanding. D/c education completed with patient/family including follow up instructions, medication list, d/c activities limitations if indicated, with other d/c instructions as indicated by MD - patient able to verbalize understanding, all questions fully answered. Patient instructed to return to ED, call 911, or call MD for any changes in condition.  Patient escorted via WC, and D/C home via private auto.  Verl Dicker, RN 11/07/2021 12:55 PM

## 2021-11-08 NOTE — Progress Notes (Signed)
EMS at the bedside to transport the patient home. We were able to verify that her aunt would be at her home to receive her upon arrival. IV was removed by this RN, suprapubic catheter bag emptied prior to transport, and all personal belongings were taken with the patient.

## 2021-11-09 ENCOUNTER — Telehealth: Payer: Self-pay

## 2021-11-09 LAB — CULTURE, BLOOD (ROUTINE X 2)
Culture: NO GROWTH
Culture: NO GROWTH
Special Requests: ADEQUATE

## 2021-11-09 NOTE — Telephone Encounter (Signed)
Patient called advising she needed sterile saline solution to flush her cath sent to the pharmacy below.   Pharmacy: [facility] - [place], [place] - [address]

## 2021-11-09 NOTE — Telephone Encounter (Signed)
Patient has sterile saline at pharmacy with 15 refills.  I informed her that the refills are there and she just has to contact pharmacy when she needs a refill.  Patient voiced understanding.  Nothing further needed at this time.

## 2021-11-10 ENCOUNTER — Telehealth: Payer: Self-pay

## 2021-11-10 NOTE — Telephone Encounter (Signed)
Patient calling back to let nurse know the  Water For Irrigation, Sterile (STERILE WATER FOR IRRIGATION) Is not in stock at patient's pharmacy.  [facility] - [place], [place] - [address] Phone:  [phone]  Fax:  [phone]     Thanks, Rosey Bath

## 2021-11-10 NOTE — Telephone Encounter (Signed)
Carlena Sax spoke to patient, she is aware that supplies are at pharmacy.  Nothing further needed.

## 2021-11-12 ENCOUNTER — Other Ambulatory Visit: Payer: Self-pay

## 2021-11-12 MED ORDER — STERILE WATER FOR IRRIGATION IR SOLN: 50.0000 mL | Freq: Every day | 15 refills | Status: DC

## 2021-11-12 MED ORDER — AMBULATORY NON FORMULARY MEDICATION: 30.0000 mL | Freq: Every day | 11 refills | Status: AC | PRN

## 2021-11-12 MED ORDER — AMBULATORY NON FORMULARY MEDICATION: 30.0000 mL | Freq: Every day | 11 refills | Status: DC | PRN

## 2021-11-12 NOTE — Progress Notes (Signed)
Refill of sterile water sent to pharmacy

## 2021-11-12 NOTE — Telephone Encounter (Signed)
Spoke to [facility] [employer], they do not cary saline water.  Rx sent to [facility] pharmacy, pt aware

## 2021-11-12 NOTE — Telephone Encounter (Signed)
Patient went to pharmacy to pick up  Water For Irrigation, Sterile (STERILE WATER FOR IRRIGATION) Pharmacy is telling patient her Doctor needs to give approval to have RX filled.  Patient wants a call back asap to know if approval was sent and her Rx has been filled.  Call back: [phone]  Thanks, Melissa West

## 2021-11-13 NOTE — Telephone Encounter (Signed)
Patient called advising that [facility] contacted her and advised that they got the sterile water in and someone from the pharmacy will be delivering it to her house today.

## 2021-11-18 ENCOUNTER — Other Ambulatory Visit: Payer: Self-pay

## 2021-11-18 ENCOUNTER — Emergency Department (HOSPITAL_COMMUNITY)
Admission: EM | Admit: 2021-11-18 | Discharge: 2021-11-18 | Disposition: A | Discharge status: Home / Self Care | Payer: Medicare Other | Attending: Emergency Medicine | Admitting: Emergency Medicine

## 2021-11-18 ENCOUNTER — Encounter (HOSPITAL_COMMUNITY): Payer: Self-pay

## 2021-11-18 DIAGNOSIS — Z743 Need for continuous supervision: Secondary | ICD-10-CM | POA: Diagnosis not present

## 2021-11-18 DIAGNOSIS — T83098A Other mechanical complication of other indwelling urethral catheter, initial encounter: Secondary | ICD-10-CM | POA: Insufficient documentation

## 2021-11-18 DIAGNOSIS — I9589 Other hypotension: Secondary | ICD-10-CM | POA: Insufficient documentation

## 2021-11-18 DIAGNOSIS — Y69 Unspecified misadventure during surgical and medical care: Secondary | ICD-10-CM | POA: Insufficient documentation

## 2021-11-18 DIAGNOSIS — Z7401 Bed confinement status: Secondary | ICD-10-CM | POA: Diagnosis not present

## 2021-11-18 DIAGNOSIS — R69 Illness, unspecified: Secondary | ICD-10-CM | POA: Diagnosis not present

## 2021-11-18 DIAGNOSIS — R5381 Other malaise: Secondary | ICD-10-CM | POA: Diagnosis not present

## 2021-11-18 DIAGNOSIS — I499 Cardiac arrhythmia, unspecified: Secondary | ICD-10-CM | POA: Diagnosis not present

## 2021-11-18 DIAGNOSIS — T83010A Breakdown (mechanical) of cystostomy catheter, initial encounter: Secondary | ICD-10-CM

## 2021-11-18 NOTE — ED Notes (Signed)
[employer] called to set up transportation at this time. 

## 2021-11-18 NOTE — ED Provider Notes (Signed)
[facility] EMERGENCY DEPARTMENT Provider Note   [name]: [id number] Arrival date & time: 11/18/21  1314     History  Chief Complaint  Patient presents with   Suprapubic Catheter Problem     Melissa West is a 31 y.o. female.  With PMH of MS and urinary retention with indwelling suprapubic catheter who presents with suprapubic catheter problems.  She is here because she said since yesterday night she has had no drainage.  She tried flushing it at home but had no pee come out which made her come to the ER today.  She had last her suprapubic catheter last exchanged mid July when she had issues in the past.  She denies any abdominal pain, flank pain, nausea, vomiting, fevers.  She has no other complaints.  HPI     Home Medications Prior to Admission medications   Medication Sig Start Date End Date Taking? Authorizing Provider  acetaminophen (TYLENOL) 325 MG tablet Take 2 tablets (650 mg total) by mouth every 6 (six) hours as needed for mild pain (or Fever >/= 101). Patient not taking: Reported on 08/12/2021 05/20/21   Shon Hale, MD  AMBULATORY NON FORMULARY MEDICATION 30 mLs by Intracatheter route daily as needed. Medication Name: Sodium Chloride 0.9% 11/12/21   [name], Mardene Celeste, MD  ergocalciferol (VITAMIN D2) 1.25 MG (50000 UT) capsule Take 1 capsule (50,000 Units total) by mouth once a week. 11/07/21   Emokpae, Courage, MD  KESIMPTA 20 MG/0.4ML SOAJ Inject 20 mg into the skin every 30 (thirty) days. 06/24/21   [provider]  midodrine (PROAMATINE) 5 MG tablet Take 1 tablet (5 mg total) by mouth 3 (three) times daily with meals. To Help Prevent your Blood Pressure from dropping 11/07/21   Shon Hale, MD  polyvinyl alcohol (LIQUIFILM TEARS) 1.4 % ophthalmic solution Place 1 drop into both eyes as needed for dry eyes. Patient not taking: Reported on 06/26/2021 06/20/21   Shon Hale, MD  senna-docusate (SENOKOT-S) 8.6-50 MG tablet Take 2 tablets by mouth at bedtime.  11/07/21 11/07/22  Shon Hale, MD  Water For Irrigation, Sterile (STERILE WATER FOR IRRIGATION) Irrigate with 50 mLs as directed daily. Irrigate catheter daily with 50cc of sterile water. 11/12/21   [name], Mardene Celeste, MD      Allergies    Patient has no known allergies.    Review of Systems   Review of Systems  Physical Exam Updated Vital Signs BP 108/76   Pulse 84   Temp 98.1 F (36.7 C) (Oral)   Resp 18   Ht 5\' 3"  (1.6 m)   Wt 60 kg   SpO2 99%   BMI 23.43 kg/m  Physical Exam Constitutional: Alert and oriented. Well appearing and in no distress. Eyes: Conjunctivae are normal. ENT      Head: Normocephalic and atraumatic.      Nose: No congestion.      Mouth/Throat: Mucous membranes are moist.      Neck: No stridor. Cardiovascular: S1, S2,  Normal and symmetric distal pulses are present in all extremities.Warm and well perfused. Respiratory: Normal respiratory effort. Breath sounds are normal. Gastrointestinal: Soft and nontender. There is no CVA tenderness.  Suprapubic catheter in place with no surrounding erythema or tenderness, no drainage, yellow clear urine draining into Foley catheter bag, 100 cc yellow clear urine present. 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 No gross focal neurologic deficits are appreciated. Skin: Skin is warm, dry and intact. No rash  noted. Psychiatric: Mood and affect are normal. Speech and behavior are normal.  ED Results / Procedures / Treatments   Labs (all labs ordered are listed, but only abnormal results are displayed) Labs Reviewed - No data to display  EKG None  Radiology No results found.  Procedures Procedures    Medications Ordered in ED Medications - No data to display  ED Course/ Medical Decision Making/ A&P                           Medical Decision Making Melissa West is a 31 y.o. female.  With PMH of MS and urinary  retention with indwelling suprapubic catheter who presents with suprapubic catheter problems.   Upon arrival, nursing staff was able to successfully flush patient's suprapubic urine catheter.  She has been draining clear yellow urine with no infectious symptoms and no abdominal pain, no concern for UTI and will hold off from obtaining urine as it will historically appear affected due to coming from chronic indwelling catheter.  She was observed for a period of 1 hour and was able to successfully tolerate po and produce another 100 cc of urine.  Do not think labs are required at this time since she is successfully making urine and Foley catheter has only been blocked for less than 12 hours.  Safe for discharge home with strict return precautions and follow-up with PCP.      Final Clinical Impression(s) / ED Diagnoses Final diagnoses:  Suprapubic catheter dysfunction, initial encounter [facility])    Rx / DC Orders ED Discharge Orders     None         Mardene Sayer, MD 11/18/21 1431

## 2021-11-18 NOTE — ED Notes (Signed)
Cath flushed with ease, urine is draining into foley bag

## 2021-11-18 NOTE — ED Triage Notes (Signed)
Patient has suprapubic cathter in place chronically and states cathter is clogged and will not flush. Patient states her bed was wet.

## 2021-11-18 NOTE — Discharge Instructions (Signed)
You were seen in the emergency department today for suprapubic catheter issues.  We were able to successfully flush your catheter and your catheter was freely draining urine after this.  Come back with any further issues with your suprapubic catheter, fevers, vomiting, severe abdominal pain or any other symptoms concerning to you.

## 2021-11-20 ENCOUNTER — Telehealth: Payer: Self-pay

## 2021-11-20 DIAGNOSIS — G35 Multiple sclerosis: Secondary | ICD-10-CM | POA: Diagnosis not present

## 2021-11-20 NOTE — Telephone Encounter (Signed)
Patient called office c/o of bed being wet in the morning when she wakes up. Patient states that it only happens at night.  Patient asked if her catheter could possibly be  kinked off while she is sleeping.  Patient states, "Well ya'll didn't tell me to sleep a certain way."  Patient informed that the suggestion was just made to see what could be causing the catheter to leak at night.  Patient began talking about the pharmacy having her supplies to flush her catheter and not giving them to her. Patient informed that writer knew nothing about that.  Patient states ,"I know ya'll have other options to her patients with catheters."  Patient made aware if the clinic was unable to provide the care/supplies needed the option would be to send patient somewhere that could provide the care/supplies needed. Patient states,"Well I'm not going to argue today, but I get what you're saying" Patient hung up phone.

## 2021-11-23 DIAGNOSIS — G35 Multiple sclerosis: Secondary | ICD-10-CM | POA: Diagnosis not present

## 2021-11-24 ENCOUNTER — Encounter (HOSPITAL_COMMUNITY): Payer: Self-pay

## 2021-11-24 ENCOUNTER — Other Ambulatory Visit: Payer: Self-pay

## 2021-11-24 ENCOUNTER — Emergency Department (HOSPITAL_COMMUNITY)
Admission: EM | Admit: 2021-11-24 | Discharge: 2021-11-24 | Disposition: A | Discharge status: Home / Self Care | Payer: Medicare Other | Attending: Emergency Medicine | Admitting: Emergency Medicine

## 2021-11-24 DIAGNOSIS — Z7401 Bed confinement status: Secondary | ICD-10-CM | POA: Diagnosis not present

## 2021-11-24 DIAGNOSIS — K029 Dental caries, unspecified: Secondary | ICD-10-CM | POA: Insufficient documentation

## 2021-11-24 DIAGNOSIS — R531 Weakness: Secondary | ICD-10-CM | POA: Diagnosis not present

## 2021-11-24 DIAGNOSIS — K0889 Other specified disorders of teeth and supporting structures: Secondary | ICD-10-CM | POA: Diagnosis not present

## 2021-11-24 DIAGNOSIS — R52 Pain, unspecified: Secondary | ICD-10-CM | POA: Diagnosis not present

## 2021-11-24 DIAGNOSIS — Z743 Need for continuous supervision: Secondary | ICD-10-CM | POA: Diagnosis not present

## 2021-11-24 DIAGNOSIS — R5381 Other malaise: Secondary | ICD-10-CM | POA: Diagnosis not present

## 2021-11-24 DIAGNOSIS — I499 Cardiac arrhythmia, unspecified: Secondary | ICD-10-CM | POA: Diagnosis not present

## 2021-11-24 MED ORDER — AMOXICILLIN 500 MG PO CAPS: 500.0000 mg | ORAL_CAPSULE | Freq: Three times a day (TID) | ORAL | 0 refills | Status: DC

## 2021-11-24 NOTE — ED Triage Notes (Signed)
Pt c/o of left side dental pain that started yesterday

## 2021-11-24 NOTE — Discharge Instructions (Addendum)
Return if any problems.   Schedule to see your dentist for recheck.

## 2021-11-25 NOTE — ED Provider Notes (Signed)
[facility] EMERGENCY DEPARTMENT Provider Note   [name]: [id number] Arrival date & time: 11/24/21  1116     History  Chief Complaint  Patient presents with   Dental Pain    Melissa West is a 31 y.o. female.  The history is provided by the patient. No language interpreter was used.  Dental Pain Location:  Upper Upper teeth location:  12/LU 1st bicuspid Quality:  Aching Severity:  Moderate Duration:  3 days Timing:  Constant Progression:  Worsening Chronicity:  New Previous work-up:  Dental exam Relieved by:  Nothing Worsened by:  Nothing Ineffective treatments:  None tried Associated symptoms: facial pain        Home Medications Prior to Admission medications   Medication Sig Start Date End Date Taking? Authorizing Provider  amoxicillin (AMOXIL) 500 MG capsule Take 1 capsule (500 mg total) by mouth 3 (three) times daily. 11/24/21  Yes Elson Areas, PA-C  acetaminophen (TYLENOL) 325 MG tablet Take 2 tablets (650 mg total) by mouth every 6 (six) hours as needed for mild pain (or Fever >/= 101). Patient not taking: Reported on 08/12/2021 05/20/21   Shon Hale, MD  AMBULATORY NON FORMULARY MEDICATION 30 mLs by Intracatheter route daily as needed. Medication Name: Sodium Chloride 0.9% 11/12/21   [name], Mardene Celeste, MD  ergocalciferol (VITAMIN D2) 1.25 MG (50000 UT) capsule Take 1 capsule (50,000 Units total) by mouth once a week. 11/07/21   Emokpae, Courage, MD  KESIMPTA 20 MG/0.4ML SOAJ Inject 20 mg into the skin every 30 (thirty) days. 06/24/21   [provider]  midodrine (PROAMATINE) 5 MG tablet Take 1 tablet (5 mg total) by mouth 3 (three) times daily with meals. To Help Prevent your Blood Pressure from dropping 11/07/21   Shon Hale, MD  polyvinyl alcohol (LIQUIFILM TEARS) 1.4 % ophthalmic solution Place 1 drop into both eyes as needed for dry eyes. Patient not taking: Reported on 06/26/2021 06/20/21   Shon Hale, MD  senna-docusate (SENOKOT-S)  8.6-50 MG tablet Take 2 tablets by mouth at bedtime. 11/07/21 11/07/22  Shon Hale, MD  Water For Irrigation, Sterile (STERILE WATER FOR IRRIGATION) Irrigate with 50 mLs as directed daily. Irrigate catheter daily with 50cc of sterile water. 11/12/21   [name], Mardene Celeste, MD      Allergies    Patient has no known allergies.    Review of Systems   Review of Systems  All other systems reviewed and are negative.   Physical Exam Updated Vital Signs BP 128/80 (BP Location: Right Arm)   Pulse 94   Temp 98.7 F (37.1 C) (Oral)   Resp 16   Ht 5\' 2"  (1.575 m)   Wt 58.1 kg   SpO2 100%   BMI 23.41 kg/m  Physical Exam Vitals and nursing note reviewed.  Constitutional:      General: She is not in acute distress.    Appearance: She is well-developed.  HENT:     Head: Normocephalic and atraumatic.     Mouth/Throat:     Mouth: Mucous membranes are moist.     Comments: Dental decay Eyes:     Conjunctiva/sclera: Conjunctivae normal.  Cardiovascular:     Rate and Rhythm: Normal rate and regular rhythm.     Heart sounds: No murmur heard. Pulmonary:     Effort: Pulmonary effort is normal. No respiratory distress.     Breath sounds: Normal breath sounds.  Musculoskeletal:        General: No swelling.     Cervical  back: Neck supple.  Skin:    General: Skin is warm and dry.     Capillary Refill: Capillary refill takes less than 2 seconds.  Neurological:     Mental Status: She is alert.  Psychiatric:        Mood and Affect: Mood normal.     ED Results / Procedures / Treatments   Labs (all labs ordered are listed, but only abnormal results are displayed) Labs Reviewed - No data to display  EKG None  Radiology No results found.  Procedures Procedures    Medications Ordered in ED Medications - No data to display  ED Course/ Medical Decision Making/ A&P                           Medical Decision Making Risk Prescription drug management.           Final  Clinical Impression(s) / ED Diagnoses Final diagnoses:  Pain, dental    Rx / DC Orders ED Discharge Orders          Ordered    amoxicillin (AMOXIL) 500 MG capsule  3 times daily        11/24/21 1223           An After Visit Summary was printed and given to the patient.    Elson Areas, [place] 11/25/21 1026    Mancel Bale, MD 11/25/21 1135

## 2021-11-26 ENCOUNTER — Telehealth: Payer: Self-pay

## 2021-11-26 ENCOUNTER — Ambulatory Visit: Payer: Medicare Other | Admitting: Physician Assistant

## 2021-11-26 NOTE — Telephone Encounter (Signed)
        Patient  visited [facility] on 11/24/2021  for dental pain   Telephone encounter attempt :  1st  Cant leave a message   [facility] Guide, Embedded Care Coordination [facility], Care Management  [phone] [address] [place], [place], [place] [address] Phone: [phone] Email: [email or web address]

## 2021-11-29 ENCOUNTER — Emergency Department (HOSPITAL_COMMUNITY)
Admission: EM | Admit: 2021-11-29 | Discharge: 2021-11-30 | Disposition: A | Discharge status: Home / Self Care | Payer: Medicare Other | Attending: Emergency Medicine | Admitting: Emergency Medicine

## 2021-11-29 ENCOUNTER — Encounter (HOSPITAL_COMMUNITY): Payer: Self-pay

## 2021-11-29 ENCOUNTER — Other Ambulatory Visit: Payer: Self-pay

## 2021-11-29 DIAGNOSIS — Z743 Need for continuous supervision: Secondary | ICD-10-CM | POA: Diagnosis not present

## 2021-11-29 DIAGNOSIS — R6884 Jaw pain: Secondary | ICD-10-CM | POA: Insufficient documentation

## 2021-11-29 DIAGNOSIS — K0889 Other specified disorders of teeth and supporting structures: Secondary | ICD-10-CM | POA: Diagnosis not present

## 2021-11-29 DIAGNOSIS — R519 Headache, unspecified: Secondary | ICD-10-CM

## 2021-11-29 DIAGNOSIS — R609 Edema, unspecified: Secondary | ICD-10-CM | POA: Diagnosis not present

## 2021-11-29 DIAGNOSIS — Z9359 Other cystostomy status: Secondary | ICD-10-CM

## 2021-11-29 DIAGNOSIS — R52 Pain, unspecified: Secondary | ICD-10-CM | POA: Diagnosis not present

## 2021-11-29 NOTE — ED Triage Notes (Signed)
Pt arrived from home via [facility] w c/o catheter issue. Pt states that it will not flush.

## 2021-11-29 NOTE — ED Provider Notes (Signed)
[facility] EMERGENCY DEPARTMENT Provider Note   [name]: [id number] Arrival date & time: 11/29/21  2152     History  Chief Complaint  Patient presents with   catheter issue    Melissa West is a 31 y.o. female.  HPI Patient states her Foley has not been draining much.  States will not flush.  History of suprapubic catheter.  No fevers.  Slight abdominal pain.  Also has left-sided facial pain.  Near the teeth.  States she thinks she may have an abscess in her cheek.  Recently had been seen for same and had been on antibiotics.   Past Medical History:  Diagnosis Date   MS (multiple sclerosis) (HCC)    No pertinent past medical history     Home Medications Prior to Admission medications   Medication Sig Start Date End Date Taking? Authorizing Provider  acetaminophen (TYLENOL) 325 MG tablet Take 2 tablets (650 mg total) by mouth every 6 (six) hours as needed for mild pain (or Fever >/= 101). Patient not taking: Reported on 08/12/2021 05/20/21   Shon Hale, MD  AMBULATORY NON FORMULARY MEDICATION 30 mLs by Intracatheter route daily as needed. Medication Name: Sodium Chloride 0.9% 11/12/21   [name], Mardene Celeste, MD  amoxicillin (AMOXIL) 500 MG capsule Take 1 capsule (500 mg total) by mouth 3 (three) times daily. 11/24/21   Elson Areas, PA-C  ergocalciferol (VITAMIN D2) 1.25 MG (50000 UT) capsule Take 1 capsule (50,000 Units total) by mouth once a week. 11/07/21   Emokpae, Courage, MD  KESIMPTA 20 MG/0.4ML SOAJ Inject 20 mg into the skin every 30 (thirty) days. 06/24/21   [provider]  midodrine (PROAMATINE) 5 MG tablet Take 1 tablet (5 mg total) by mouth 3 (three) times daily with meals. To Help Prevent your Blood Pressure from dropping 11/07/21   Shon Hale, MD  polyvinyl alcohol (LIQUIFILM TEARS) 1.4 % ophthalmic solution Place 1 drop into both eyes as needed for dry eyes. Patient not taking: Reported on 06/26/2021 06/20/21   Shon Hale, MD  senna-docusate  (SENOKOT-S) 8.6-50 MG tablet Take 2 tablets by mouth at bedtime. 11/07/21 11/07/22  Shon Hale, MD  Water For Irrigation, Sterile (STERILE WATER FOR IRRIGATION) Irrigate with 50 mLs as directed daily. Irrigate catheter daily with 50cc of sterile water. 11/12/21   [name], Mardene Celeste, MD      Allergies    Patient has no known allergies.    Review of Systems   Review of Systems  Physical Exam Updated Vital Signs BP 93/61   Pulse 78   Temp 98 F (36.7 C) (Oral)   SpO2 99%  Physical Exam Vitals and nursing note reviewed.  HENT:     Mouth/Throat:     Comments: Some tenderness over teeth on left upper jaw.  No swelling of left cheek or on the jaw. Abdominal:     Tenderness: There is no abdominal tenderness.  Skin:    General: Skin is warm.  Neurological:     Mental Status: She is alert.     Comments: Some chronic contractions.     ED Results / Procedures / Treatments   Labs (all labs ordered are listed, but only abnormal results are displayed) Labs Reviewed - No data to display  EKG None  Radiology No results found.  Procedures Procedures    Medications Ordered in ED Medications - No data to display  ED Course/ Medical Decision Making/ A&P  Medical Decision Making  Patient with reported difficulty having her Foley draining.  Bedside bladder scan done by nursing and states that was 14 mL.  Does not appear to be a primary Foley issue at this time.  There is urine in the bag.  Will flush however to keep Foley flowing freely. Is a left-sided dental/facial pain.  Some tenderness on the jaw but no swelling.  Is currently on antibiotics.  Blood pressure slightly low but appears to be baseline.  Doubt severe infection.  No swelling of the jaw or cheek.  We will continue with current management.  Do not think we need to change antibiotics at this time.  Will discharge home        Final Clinical Impression(s) / ED Diagnoses Final diagnoses:   None    Rx / DC Orders ED Discharge Orders     None         Benjiman Core, MD 11/29/21 2354

## 2021-11-29 NOTE — Discharge Instructions (Signed)
Since you are already on antibiotics continue the antibiotics you are on.  You can follow-up with a dentist as needed.  Your catheter was not obstructed although we have irrigated.  Follow-up with Dr. Margo Aye.

## 2021-11-30 ENCOUNTER — Telehealth: Payer: Self-pay

## 2021-11-30 DIAGNOSIS — Z7401 Bed confinement status: Secondary | ICD-10-CM | POA: Diagnosis not present

## 2021-11-30 DIAGNOSIS — R29898 Other symptoms and signs involving the musculoskeletal system: Secondary | ICD-10-CM | POA: Diagnosis not present

## 2021-11-30 DIAGNOSIS — Z743 Need for continuous supervision: Secondary | ICD-10-CM | POA: Diagnosis not present

## 2021-11-30 NOTE — Telephone Encounter (Signed)
     Patient  visit on 8/21  at [facility] was for headache  Have you been able to follow up with your primary care physician?y  The patient was or was not able to obtain any needed medicine or equipment.y  Are there diet recommendations that you are having difficulty following?na  Patient expresses understanding of discharge instructions and education provided has no other needs at this time. yes     Lenard Forth Care Guide, Embedded Care Coordination [facility], Care Management  [phone] [address] [place], [place], [place] [address] Phone: [phone] Email: [email or web address]

## 2021-12-03 ENCOUNTER — Ambulatory Visit: Payer: Medicare Other | Admitting: Physician Assistant

## 2021-12-08 ENCOUNTER — Ambulatory Visit (HOSPITAL_COMMUNITY): Payer: Medicare Other | Attending: Family Medicine | Admitting: Physical Therapy

## 2021-12-09 ENCOUNTER — Ambulatory Visit: Payer: Medicare Other | Admitting: Physician Assistant

## 2021-12-15 ENCOUNTER — Encounter (HOSPITAL_COMMUNITY): Payer: Self-pay

## 2021-12-15 ENCOUNTER — Emergency Department (HOSPITAL_COMMUNITY)
Admission: EM | Admit: 2021-12-15 | Discharge: 2021-12-16 | Disposition: A | Discharge status: Home / Self Care | Payer: Medicare Other | Attending: Emergency Medicine | Admitting: Emergency Medicine

## 2021-12-15 ENCOUNTER — Other Ambulatory Visit: Payer: Self-pay

## 2021-12-15 DIAGNOSIS — Z743 Need for continuous supervision: Secondary | ICD-10-CM | POA: Diagnosis not present

## 2021-12-15 DIAGNOSIS — T83098A Other mechanical complication of other indwelling urethral catheter, initial encounter: Secondary | ICD-10-CM | POA: Diagnosis not present

## 2021-12-15 DIAGNOSIS — R6889 Other general symptoms and signs: Secondary | ICD-10-CM | POA: Diagnosis not present

## 2021-12-15 DIAGNOSIS — R509 Fever, unspecified: Secondary | ICD-10-CM | POA: Diagnosis not present

## 2021-12-15 DIAGNOSIS — N3001 Acute cystitis with hematuria: Secondary | ICD-10-CM | POA: Insufficient documentation

## 2021-12-15 DIAGNOSIS — T83010A Breakdown (mechanical) of cystostomy catheter, initial encounter: Secondary | ICD-10-CM

## 2021-12-15 DIAGNOSIS — R339 Retention of urine, unspecified: Secondary | ICD-10-CM | POA: Diagnosis not present

## 2021-12-15 LAB — URINALYSIS, ROUTINE W REFLEX MICROSCOPIC
Bilirubin Urine: NEGATIVE
Glucose, UA: NEGATIVE mg/dL
Ketones, ur: NEGATIVE mg/dL
Nitrite: POSITIVE — AB
Protein, ur: >=300 mg/dL — AB
RBC / HPF: >50 RBC/hpf — ABNORMAL HIGH (ref 0–5)
Specific Gravity, Urine: 1.018 (ref 1.005–1.030)
WBC, UA: >50 WBC/hpf — ABNORMAL HIGH (ref 0–5)
pH: 8 (ref 5.0–8.0)

## 2021-12-15 LAB — COMPREHENSIVE METABOLIC PANEL
ALT: 10 U/L (ref 0–44)
AST: 13 U/L — ABNORMAL LOW (ref 15–41)
Albumin: 3.3 g/dL — ABNORMAL LOW (ref 3.5–5.0)
Alkaline Phosphatase: 51 U/L (ref 38–126)
Anion gap: 5 (ref 5–15)
BUN: 15 mg/dL (ref 6–20)
CO2: 28 mmol/L (ref 22–32)
Calcium: 9 mg/dL (ref 8.9–10.3)
Chloride: 108 mmol/L (ref 98–111)
Creatinine, Ser: 0.69 mg/dL (ref 0.44–1.00)
GFR, Estimated: >60 mL/min (ref >60)
Glucose, Bld: 85 mg/dL (ref 70–99)
Potassium: 3.7 mmol/L (ref 3.5–5.1)
Sodium: 141 mmol/L (ref 135–145)
Total Bilirubin: 0.5 mg/dL (ref 0.3–1.2)
Total Protein: 6.9 g/dL (ref 6.5–8.1)

## 2021-12-15 LAB — CBC WITH DIFFERENTIAL/PLATELET
Abs Immature Granulocytes: 0.01 10*3/uL (ref 0.00–0.07)
Basophils Absolute: 0 10*3/uL (ref 0.0–0.1)
Basophils Relative: 0 %
Eosinophils Absolute: 0.1 10*3/uL (ref 0.0–0.5)
Eosinophils Relative: 2 %
HCT: 35.9 % — ABNORMAL LOW (ref 36.0–46.0)
Hemoglobin: 11.7 g/dL — ABNORMAL LOW (ref 12.0–15.0)
Immature Granulocytes: 0 %
Lymphocytes Relative: 48 %
Lymphs Abs: 1.5 10*3/uL (ref 0.7–4.0)
MCH: 28.4 pg (ref 26.0–34.0)
MCHC: 32.6 g/dL (ref 30.0–36.0)
MCV: 87.1 fL (ref 80.0–100.0)
Monocytes Absolute: 0.2 10*3/uL (ref 0.1–1.0)
Monocytes Relative: 6 %
Neutro Abs: 1.4 10*3/uL — ABNORMAL LOW (ref 1.7–7.7)
Neutrophils Relative %: 44 %
Platelets: 269 10*3/uL (ref 150–400)
RBC: 4.12 MIL/uL (ref 3.87–5.11)
RDW: 14.3 % (ref 11.5–15.5)
WBC: 3.1 10*3/uL — ABNORMAL LOW (ref 4.0–10.5)
nRBC: 0 % (ref 0.0–0.2)

## 2021-12-15 MED ORDER — CEFTRIAXONE SODIUM 1 G IJ SOLR
1.0000 g | Freq: Once | INTRAMUSCULAR | Status: AC
  Administered 2021-12-15: 1 g via INTRAMUSCULAR
  Filled 2021-12-15: qty 10

## 2021-12-15 MED ORDER — SODIUM CHLORIDE 0.9 % IV BOLUS: 500.0000 mL | Freq: Once | INTRAVENOUS | Status: DC

## 2021-12-15 MED ORDER — LIDOCAINE HCL (PF) 1 % IJ SOLN: 2.0000 mL | Freq: Once | INTRAMUSCULAR | Status: AC

## 2021-12-15 MED ORDER — NITROFURANTOIN MONOHYD MACRO 100 MG PO CAPS: 100.0000 mg | ORAL_CAPSULE | Freq: Two times a day (BID) | ORAL | 0 refills | Status: DC

## 2021-12-15 MED ORDER — LIDOCAINE HCL (PF) 1 % IJ SOLN
INTRAMUSCULAR | Status: AC
  Administered 2021-12-15: 1.2 mL
  Filled 2021-12-15: qty 5

## 2021-12-15 NOTE — ED Notes (Signed)
Patient states she does not want to be stuck for IV fluid therapy. MD made aware. Patient is tolerating PO intake.

## 2021-12-15 NOTE — ED Provider Notes (Signed)
[facility] EMERGENCY DEPARTMENT Provider Note   [name]: [id number] Arrival date & time: 12/15/21  1251     History  Chief Complaint  Patient presents with       Melissa West is a 31 y.o. female.  HPI   31 year old female presents emergency department with complaints of catheter being blocked.  Patient has had difficulty with her suprapubic catheter for the past 2 days.  She states she tried to flush it at home with minimal to no success.  She states that her urine has been coming around where the catheter has been in place.  Denies malodorous urine or grossly discolored urine.  Denies fever, chills, night sweats, abdominal pain, flank pain, nausea, vomiting, urinary/vaginal symptoms, change in bowel habits.  Past medical history significant for multiple sclerosis, urinary retention, chronic anemia,  Home Medications Prior to Admission medications   Medication Sig Start Date End Date Taking? Authorizing Provider  KESIMPTA 20 MG/0.4ML SOAJ Inject 20 mg into the skin every 30 (thirty) days. 06/24/21  Yes [provider]  senna-docusate (SENOKOT-S) 8.6-50 MG tablet Take 2 tablets by mouth at bedtime. 11/07/21 11/07/22 Yes Shon Hale, MD  Water For Irrigation, Sterile (STERILE WATER FOR IRRIGATION) Irrigate with 50 mLs as directed daily. Irrigate catheter daily with 50cc of sterile water. 11/12/21  Yes [name], Mardene Celeste, MD  acetaminophen (TYLENOL) 325 MG tablet Take 2 tablets (650 mg total) by mouth every 6 (six) hours as needed for mild pain (or Fever >/= 101). Patient not taking: Reported on 08/12/2021 05/20/21   Shon Hale, MD  AMBULATORY NON FORMULARY MEDICATION 30 mLs by Intracatheter route daily as needed. Medication Name: Sodium Chloride 0.9% 11/12/21   [name], Mardene Celeste, MD  amoxicillin (AMOXIL) 500 MG capsule Take 1 capsule (500 mg total) by mouth 3 (three) times daily. Patient not taking: Reported on 12/15/2021 11/24/21   Elson Areas, PA-C  ergocalciferol  (VITAMIN D2) 1.25 MG (50000 UT) capsule Take 1 capsule (50,000 Units total) by mouth once a week. Patient not taking: Reported on 12/15/2021 11/07/21   Shon Hale, MD  midodrine (PROAMATINE) 5 MG tablet Take 1 tablet (5 mg total) by mouth 3 (three) times daily with meals. To Help Prevent your Blood Pressure from dropping Patient not taking: Reported on 12/15/2021 11/07/21   Shon Hale, MD  polyvinyl alcohol (LIQUIFILM TEARS) 1.4 % ophthalmic solution Place 1 drop into both eyes as needed for dry eyes. Patient not taking: Reported on 06/26/2021 06/20/21   Shon Hale, MD      Allergies    Patient has no known allergies.    Review of Systems   Review of Systems  All other systems reviewed and are negative.   Physical Exam Updated Vital Signs BP 111/80   Pulse 78   Temp 98.2 F (36.8 C) (Oral)   Resp 14   SpO2 95%  Physical Exam Vitals and nursing note reviewed.  Constitutional:      General: She is not in acute distress.    Appearance: Normal appearance. She is well-developed.  HENT:     Head: Normocephalic and atraumatic.  Eyes:     Conjunctiva/sclera: Conjunctivae normal.  Cardiovascular:     Rate and Rhythm: Normal rate and regular rhythm.     Heart sounds: No murmur heard. Pulmonary:     Effort: Pulmonary effort is normal. No respiratory distress.     Breath sounds: Normal breath sounds. No wheezing or rales.  Abdominal:     General: Abdomen is  flat.     Palpations: Abdomen is soft.     Tenderness: There is no abdominal tenderness. There is no right CVA tenderness or left CVA tenderness.     Comments: No obvious discharge from around suprapubic catheter.  No erythematic, indurated, fluctuant skin around suprapubic catheter placement.  No tenderness in affected area.  Musculoskeletal:        General: No swelling.     Cervical back: Neck supple.     Comments: Patient's lower extremities are contracted at the hip, knee secondary to known MS.  Skin:    General:  Skin is warm and dry.     Capillary Refill: Capillary refill takes less than 2 seconds.  Neurological:     Mental Status: She is alert.  Psychiatric:        Mood and Affect: Mood normal.     ED Results / Procedures / Treatments   Labs (all labs ordered are listed, but only abnormal results are displayed) Labs Reviewed  URINALYSIS, ROUTINE W REFLEX MICROSCOPIC - Abnormal; Notable for the following components:      Result Value   APPearance CLOUDY (*)    Hgb urine dipstick MODERATE (*)    Protein, ur >=300 (*)    Nitrite POSITIVE (*)    Leukocytes,Ua LARGE (*)    RBC / HPF >50 (*)    WBC, UA >50 (*)    Bacteria, UA MANY (*)    All other components within normal limits  COMPREHENSIVE METABOLIC PANEL - Abnormal; Notable for the following components:   Albumin 3.3 (*)    AST 13 (*)    All other components within normal limits  CBC WITH DIFFERENTIAL/PLATELET - Abnormal; Notable for the following components:   WBC 3.1 (*)    Hemoglobin 11.7 (*)    HCT 35.9 (*)    Neutro Abs 1.4 (*)    All other components within normal limits  URINE CULTURE    EKG None  Radiology No results found.  Procedures Procedures    Medications Ordered in ED Medications  sodium chloride 0.9 % bolus 500 mL (500 mLs Intravenous Patient Refused/Not Given 12/15/21 1708)  cefTRIAXone (ROCEPHIN) injection 1 g (has no administration in time range)    ED Course/ Medical Decision Making/ A&P                           Medical Decision Making Amount and/or Complexity of Data Reviewed Labs: ordered.  Risk Prescription drug management.   This patient presents to the ED for concern of suprapubic catheter blockage, this involves an extensive number of treatment options, and is a complaint that carries with it a high risk of complications and morbidity.  The differential diagnosis includes suprapubic catheter blockage, UTI, sepsis, pyelonephritis.   Co morbidities that complicate the patient  evaluation  See HPI   Additional history obtained:  Additional history obtained from EMR External records from outside source obtained and reviewed including IR catheter tube change on 09/10/2021   Lab Tests:  I Ordered, and personally interpreted labs.  The pertinent results include: No leukocytosis noted.  Mild anemia with hemoglobin 11.7 which is near her baseline.  Platelets within normal range.  No electrolyte abnormalities noted.  Renal function within normal limits.  No transaminitis noted.  UA significant for positive nitrite, large leukocyte, greater than 50 WBC and many bacteria indicative of urinary tract infection with hematuria.  Patient treated with 1 g of Rocephin while  in the emergency department and DC'd with oral antibiotics.  Prior urine cultures grew out E. coli which was sensitive to both Rocephin as well as Macrobid.   Imaging Studies ordered:  N/a   Cardiac Monitoring: / EKG:  The patient was maintained on a cardiac monitor.  I personally viewed and interpreted the cardiac monitored which showed an underlying rhythm of: Sinus rhythm   Consultations Obtained:  N/a   Problem List / ED Course / Critical interventions / Medication management  Urinary retention I ordered medication including 1 g of Rocephin for empiric antibiotic therapy. Reevaluation of the patient after these medicines showed that the patient improved I have reviewed the patients home medicines and have made adjustments as needed   Social Determinants of Health:  Denies tobacco, illicit drug use   Test / Admission - Considered:  Vitals signs within normal range and stable throughout visit. Laboratory studies significant for: See above Patient's symptoms likely secondary to clogged suprapubic catheter.  This was replaced while in the emergency department.  Urinalysis was performed which showed evidence of urinary tract infection.  This was treated with 1 g of Rocephin while emergency  department and patient was discharged with 5 days of Macrobid with oral antibiotic therapy.  Urine culture was sent.  Patient recommended close follow-up with urology as well as again educated regarding catheter care.  Treatment plan was discussed at length with patient and she knowledge understanding was agreeable to said plan. Worrisome signs and symptoms were discussed with the patient, and the patient acknowledged understanding to return to the ED if noticed. Patient was stable upon discharge.        Final Clinical Impression(s) / ED Diagnoses Final diagnoses:  Suprapubic catheter dysfunction, initial encounter [facility])  Acute cystitis with hematuria    Rx / DC Orders ED Discharge Orders     None         Peter Garter, [place] 12/15/21 1743    Jacalyn Lefevre, MD 12/16/21 [phone]

## 2021-12-15 NOTE — ED Notes (Signed)
Foley Cath changed by MD

## 2021-12-15 NOTE — ED Notes (Signed)
Patient eating sprite and eating crackers as requested

## 2021-12-15 NOTE — Discharge Instructions (Addendum)
Note your work-up today was overall consistent with urinary tract infection.  We will treat this with 1 shot of antibiotics here in the emergency department as well as 5 days of oral antibiotics outpatient.  I have attached instructions for proper catheter care.  I recommend close follow-up with urology to ensure catheter is functioning properly.  Please do not hesitate to return to the emergency department for worrisome signs symptoms we discussed become apparent.

## 2021-12-15 NOTE — ED Notes (Signed)
Verified with [facility] that patient is on convo list and is waiting on transportation back home.

## 2021-12-15 NOTE — ED Notes (Signed)
Patient provided with frozen meal and drink as requested

## 2021-12-16 DIAGNOSIS — Z743 Need for continuous supervision: Secondary | ICD-10-CM | POA: Diagnosis not present

## 2021-12-16 DIAGNOSIS — Z7401 Bed confinement status: Secondary | ICD-10-CM | POA: Diagnosis not present

## 2021-12-16 DIAGNOSIS — R5381 Other malaise: Secondary | ICD-10-CM | POA: Diagnosis not present

## 2021-12-16 DIAGNOSIS — R279 Unspecified lack of coordination: Secondary | ICD-10-CM | POA: Diagnosis not present

## 2021-12-17 ENCOUNTER — Ambulatory Visit: Payer: Medicare Other | Admitting: Physician Assistant

## 2021-12-18 LAB — URINE CULTURE: Culture: >=100000 — AB

## 2021-12-19 ENCOUNTER — Telehealth: Payer: Self-pay | Admitting: Emergency Medicine

## 2021-12-19 NOTE — Telephone Encounter (Signed)
Post ED Visit - Positive Culture Follow-up  Culture report reviewed by antimicrobial stewardship pharmacist: [facility] []  , Pharm.D. []  Enzo Bi, Pharm.D., BCPS AQ-ID []  , Pharm.D., BCPS []  Celedonio Miyamoto, Pharm.D., BCPS []  [place], Garvin Fila.D., BCPS, AAHIVP []  , Pharm.D., BCPS, AAHIVP []  [place]ina Pillion, PharmD, BCPS []  , PharmD, BCPS []  [place], PharmD, BCPS []  [address], PharmD []  , PharmD, BCPS []  Estella Husk, PharmD  Pharmacy Team []  Lysle Pearl, PharmD []  , PharmD []  Phillips Climes, PharmD []  , Rph []  Agapito Games) , PharmD []  Verlan Friends, PharmD []  , PharmD []  Mervyn Gay, PharmD []  , PharmD []  Vinnie Level, PharmD []  [facility], PharmD []  , PharmD []  Len Childs, PharmD   Positive urine culture Treated with nitrofurantoin, organism sensitive to the same and no further patient follow-up is required at this time.  12/19/2021, 1:55 PM

## 2021-12-21 DIAGNOSIS — G35 Multiple sclerosis: Secondary | ICD-10-CM | POA: Diagnosis not present

## 2021-12-24 DIAGNOSIS — G35 Multiple sclerosis: Secondary | ICD-10-CM | POA: Diagnosis not present

## 2022-01-11 ENCOUNTER — Telehealth: Payer: Self-pay

## 2022-01-11 NOTE — Telephone Encounter (Signed)
Patient needing a call back regarding refills on medication and has a cath question.  Call back:  [phone]  Please advise.  Thanks, Helene Kelp

## 2022-01-12 ENCOUNTER — Telehealth: Payer: Self-pay

## 2022-01-12 NOTE — Telephone Encounter (Signed)
Patient called expressing that her cathter is becoming clogging and it's not free flowing. Patient states that she has contact [place] and she voiced that [place] told her that the office need to call regarding cath supplies. Made patient aware that to increase fluids, flush her cathter everyday and I will contact [facility] regarding her cath supplies. Patient voiced understanding.

## 2022-01-14 ENCOUNTER — Emergency Department (HOSPITAL_COMMUNITY)
Admission: EM | Admit: 2022-01-14 | Discharge: 2022-01-15 | Disposition: A | Discharge status: Home / Self Care | Payer: Medicare Other | Attending: Emergency Medicine | Admitting: Emergency Medicine

## 2022-01-14 ENCOUNTER — Other Ambulatory Visit: Payer: Self-pay

## 2022-01-14 DIAGNOSIS — T83091A Other mechanical complication of indwelling urethral catheter, initial encounter: Secondary | ICD-10-CM | POA: Insufficient documentation

## 2022-01-14 DIAGNOSIS — L89322 Pressure ulcer of left buttock, stage 2: Secondary | ICD-10-CM | POA: Insufficient documentation

## 2022-01-14 DIAGNOSIS — L89312 Pressure ulcer of right buttock, stage 2: Secondary | ICD-10-CM | POA: Insufficient documentation

## 2022-01-14 DIAGNOSIS — T83010D Breakdown (mechanical) of cystostomy catheter, subsequent encounter: Secondary | ICD-10-CM

## 2022-01-14 DIAGNOSIS — T83098A Other mechanical complication of other indwelling urethral catheter, initial encounter: Secondary | ICD-10-CM | POA: Diagnosis not present

## 2022-01-14 DIAGNOSIS — R6889 Other general symptoms and signs: Secondary | ICD-10-CM | POA: Diagnosis not present

## 2022-01-14 DIAGNOSIS — Z743 Need for continuous supervision: Secondary | ICD-10-CM | POA: Diagnosis not present

## 2022-01-14 NOTE — ED Provider Notes (Signed)
[facility] EMERGENCY DEPARTMENT Provider Note   [name]: [id number] Arrival date & time: 01/14/22  1338     History  Chief Complaint  Patient presents with   foley catheter clogged    Melissa West is a 31 y.o. female with past medical history of multiple sclerosis, urinary retention, chronic anemia, presents to the emergency department with complaints of suprapubic catheter being blocked x2 days.  She states that the fluid in her suprapubic catheter tube appeared to be cloudy.  She denies any abdominal pain.  Denies discolored urine, fever, chills, night sweats, flank pain, nausea, vomiting, vaginal symptoms, change in bowel habits.  Patient also has concern for possible bedsore to the left buttock.  She states it was brought up to her by [facility] whenever they do her brief changes.  Patient is able to roll herself in bed to change positions.  She was seen in ED 12/15/2021 for same problem.       Home Medications Prior to Admission medications   Medication Sig Start Date End Date Taking? Authorizing Provider  acetaminophen (TYLENOL) 325 MG tablet Take 2 tablets (650 mg total) by mouth every 6 (six) hours as needed for mild pain (or Fever >/= 101). Patient not taking: Reported on 08/12/2021 05/20/21   Roxan Hockey, MD  AMBULATORY NON FORMULARY MEDICATION 30 mLs by Intracatheter route daily as needed. Medication Name: Sodium Chloride 0.9% 11/12/21   [name], Candee Furbish, MD  amoxicillin (AMOXIL) 500 MG capsule Take 1 capsule (500 mg total) by mouth 3 (three) times daily. Patient not taking: Reported on 12/15/2021 11/24/21   Fransico Meadow, PA-C  ergocalciferol (VITAMIN D2) 1.25 MG (50000 UT) capsule Take 1 capsule (50,000 Units total) by mouth once a week. Patient not taking: Reported on 12/15/2021 11/07/21   Roxan Hockey, MD  KESIMPTA 20 MG/0.4ML SOAJ Inject 20 mg into the skin every 30 (thirty) days. 06/24/21   [provider]  midodrine (PROAMATINE) 5 MG tablet Take 1 tablet (5 mg  total) by mouth 3 (three) times daily with meals. To Help Prevent your Blood Pressure from dropping Patient not taking: Reported on 12/15/2021 11/07/21   Roxan Hockey, MD  nitrofurantoin, macrocrystal-monohydrate, (MACROBID) 100 MG capsule Take 1 capsule (100 mg total) by mouth 2 (two) times daily. 12/15/21   Wilnette Kales, PA  polyvinyl alcohol (LIQUIFILM TEARS) 1.4 % ophthalmic solution Place 1 drop into both eyes as needed for dry eyes. Patient not taking: Reported on 06/26/2021 06/20/21   Roxan Hockey, MD  senna-docusate (SENOKOT-S) 8.6-50 MG tablet Take 2 tablets by mouth at bedtime. 11/07/21 11/07/22  Roxan Hockey, MD  Water For Irrigation, Sterile (STERILE WATER FOR IRRIGATION) Irrigate with 50 mLs as directed daily. Irrigate catheter daily with 50cc of sterile water. 11/12/21   [name], Candee Furbish, MD      Allergies    Patient has no known allergies.    Review of Systems   Review of Systems  Constitutional:  Negative for chills and fever.  Gastrointestinal:  Negative for abdominal pain.  Genitourinary:  Negative for flank pain and hematuria.       Catheter dysfunction    Physical Exam Updated Vital Signs BP 109/66 (BP Location: Right Arm)   Pulse 65   Temp 98.7 F (37.1 C) (Oral)   Resp 18   Ht 5\' 3"  (1.6 m)   SpO2 100%   BMI 22.67 kg/m  Physical Exam Vitals and nursing note reviewed.  Constitutional:      General: She is  not in acute distress.    Appearance: Normal appearance. She is not ill-appearing or diaphoretic.  Cardiovascular:     Rate and Rhythm: Normal rate.  Pulmonary:     Effort: Pulmonary effort is normal.  Abdominal:     Comments: No erythema or discharge noted around suprapubic catheter entry site.  No tenderness, erythema, swelling around site.  Genitourinary:    Comments: Patient is wearing an adult brief.  Suprapubic catheter tubing noted to be exiting from LLQ.  Tubing appears to be blocked with yellow-brown substance.  Malodorous urine odor  noted.  Exam limited due to patient being in a hallway bed. Skin:    General: Skin is warm and dry.     Comments: Multiple small stage 2 decubitus ulcers to both buttocks.  No visible fat, muscle, or bone.  Skin around gluteal folds does also appear erythematous and irritated.  Neurological:     Mental Status: She is alert. Mental status is at baseline.  Psychiatric:        Mood and Affect: Mood normal.        Behavior: Behavior normal.     ED Results / Procedures / Treatments   Labs (all labs ordered are listed, but only abnormal results are displayed) Labs Reviewed - No data to display  EKG None  Radiology No results found.  Procedures Procedures    Medications Ordered in ED Medications - No data to display  ED Course/ Medical Decision Making/ A&P                           Medical Decision Making Amount and/or Complexity of Data Reviewed Labs: ordered.   This patient presents to the ED for concern of suprapubic catheter dysfunction differential diagnosis includes UTI, suprapubic catheter dysfunction.     Additional history obtained:  External records from outside source obtained and reviewed including telephone call from 01/12/2022 expressing concerns about catheter problem   Lab Tests:  I ordered a UA and a urine culture due to pungent urine odor.  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  Want to determine if growth of pathogenic bacteria occurs requires treatment.  Problem List / ED Course:  Suprapubic catheter dysfunction Possible UTI   Social Determinants of Health:  Requires home health, inability to complete ADLs independently due to chronic health conditions  Discussed assessment and plan with Dr. Sabra Heck who agreed.  Patient has been seen multiple times for suprapubic catheter dysfunction.  Catheter will be changed out today.  I ordered a UA and urine culture due to pungent, foul  smelling urine odor coming from patient.  Once results return, can determine if patient requires antibiotics or if patient develops symptoms such as fever, chills, abdominal pain.  Patient suprapubic catheter was a 57 French coud tip that was replaced with an 51 French standard suprapubic catheter.  There was crystallization noted around the tip of the catheter and it appeared collapsed.  New catheter flowed freely with urine.  Urine sample was collected for UA and urine culture.  Patient is stable and appropriate for discharge at this time.        Final Clinical Impression(s) / ED Diagnoses Final diagnoses:  None    Rx / DC Orders ED Discharge Orders     None         Pat Kocher, [place] 01/14/22 Evalee Jefferson    Noemi Chapel, MD  01/15/22 1241  

## 2022-01-14 NOTE — ED Triage Notes (Signed)
Pt arrived via [facility] c/o a clogged foley catheter x 2 days, denies abdominal pain

## 2022-01-14 NOTE — Discharge Instructions (Addendum)
You were seen in the ED for dysfunction of your suprapubic catheter.  We have replaced your old catheter with a new one.  We have also tested your urine for infection and will treat if the urine culture comes back positive.  If you develop signs and symptoms of a UTI such as abdominal pain, fever, chills follow-up with your PCP for treatment.  Return to ED for new or worsening symptoms or if your catheter has a problem.

## 2022-01-14 NOTE — ED Provider Notes (Signed)
I provided a substantive portion of the care of this patient.  I personally performed the entirety of the history, exam, and medical decision making for this encounter.      Patient with longstanding suprapubic catheter.  Catheter had been come clogged.  It was very difficult to draw back on.  So we changed it out and same side catheter.  We will remove the old catheter was coud and it was rockhard.  But able to get out get the new catheter in.  We will irrigate the with the new catheter send urine for culture.  Urinalysis probably does not necessarily matter because it will looked very abnormal.  Patient should be stable for discharge home     Fredia Sorrow, MD 01/14/22 1728

## 2022-01-15 ENCOUNTER — Telehealth: Payer: Self-pay

## 2022-01-15 DIAGNOSIS — R531 Weakness: Secondary | ICD-10-CM | POA: Diagnosis not present

## 2022-01-15 DIAGNOSIS — Z7401 Bed confinement status: Secondary | ICD-10-CM | POA: Diagnosis not present

## 2022-01-15 DIAGNOSIS — R5381 Other malaise: Secondary | ICD-10-CM | POA: Diagnosis not present

## 2022-01-15 DIAGNOSIS — Z743 Need for continuous supervision: Secondary | ICD-10-CM | POA: Diagnosis not present

## 2022-01-15 MED ORDER — SYRINGE 50-60 ML 60 ML MISC: 11 refills | Status: DC

## 2022-01-15 MED ORDER — STERILE WATER FOR IRRIGATION IR SOLN: 50.0000 mL | Freq: Every day | 15 refills | Status: DC

## 2022-01-15 NOTE — Telephone Encounter (Signed)
Opened in error

## 2022-01-15 NOTE — Telephone Encounter (Signed)
Patient states that pharmacy could not fill her syringe and sterile water rx and she wanted to know why.  I spoke to the pharmacist at [facility] and he states Dr.Hall had been supplying the rx for supplies and she had since been dismissed from the practice.  I informed pharmacist we would send a rx for syringe and sterile water and I would inform the patient.  Patient informed.

## 2022-01-18 ENCOUNTER — Ambulatory Visit (HOSPITAL_COMMUNITY): Payer: Medicare Other | Attending: Family Medicine | Admitting: Physical Therapy

## 2022-01-18 ENCOUNTER — Encounter (HOSPITAL_COMMUNITY): Payer: Self-pay | Admitting: Physical Therapy

## 2022-01-18 DIAGNOSIS — R262 Difficulty in walking, not elsewhere classified: Secondary | ICD-10-CM | POA: Insufficient documentation

## 2022-01-18 DIAGNOSIS — G35 Multiple sclerosis: Secondary | ICD-10-CM | POA: Diagnosis not present

## 2022-01-18 DIAGNOSIS — M6281 Muscle weakness (generalized): Secondary | ICD-10-CM | POA: Diagnosis not present

## 2022-01-18 NOTE — Therapy (Signed)
OUTPATIENT PHYSICAL THERAPY NEURO EVALUATION   Patient Name: Melissa West MRN: [id number] DOB:06/04/1990, 31 y.o., female Today's Date: 01/18/2022   PCP: Nita Sells, MD REFERRING PROVIDER: Shon Hale, MD    PT End of Session - 01/18/22 1051     Visit Number 1    Number of Visits 12    Date for PT Re-Evaluation 03/01/22    Authorization Type [employer] Medicare - Medicaid [place] secondary    Authorization Time Period "No auth, no VL"    Progress Note Due on Visit 10    PT Start Time 1052    PT Stop Time 1159    PT Time Calculation (min) 67 min    Activity Tolerance Other (comment)   Significant limitations in functional mobility   Behavior During Therapy WFL for tasks assessed/performed             Past Medical History:  Diagnosis Date   MS (multiple sclerosis) (HCC)    No pertinent past medical history    Past Surgical History:  Procedure Laterality Date   CESAREAN SECTION  08/09/2011   Procedure: CESAREAN SECTION;  Surgeon: Lesly Dukes, MD;  Location: [facility];  Service: Gynecology;  Laterality: N/A;   IR CATHETER TUBE CHANGE  07/22/2021   IR CATHETER TUBE CHANGE  09/10/2021   Patient Active Problem List   Diagnosis Date Noted   UTI (urinary tract infection) 11/04/2021   Osteomyelitis (HCC) 11/04/2021   Sepsis (HCC) 06/17/2021   Chronic anemia 05/20/2021   Abnormal finding of diagnostic imaging--Abnormal CT AP 05/19/2021   Bacteremia 05/17/2021    Class: Acute   Chest pain 05/16/2021   Hypotension 05/15/2021    Class: Acute   Sepsis secondary to UTI (HCC) 05/14/2021   Leukocytosis 03/08/2021   Acute on chronic urinary retention 03/08/2021   Pressure injury of skin 03/08/2021   AKI (acute kidney injury) (HCC) 03/07/2021   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 Significant decline in function approx 3 months ago  REFERRING DIAG: G35 (ICD-10-CM) -  MS (multiple sclerosis) (HCC)   THERAPY DIAG:  Muscle weakness (generalized) - Plan: PT plan of care cert/re-cert  Difficulty in walking, not elsewhere classified - Plan: PT plan of care cert/re-cert  Multiple sclerosis (HCC) - Plan: PT plan of care cert/re-cert  Rationale for Evaluation and Treatment Rehabilitation  SUBJECTIVE:  SUBJECTIVE STATEMENT: Patient reports about 3 months ago she noticed an increase in difficulty with mobility, specifically more trouble pushing her wheel chair around. Believes that she was able to transfer herself earlier this year but has declined and aide now uses a hoyer lift to transfer her. States she has not ambulated in 2 years.  Pt accompanied by:  Dropped off my transport van  PERTINENT HISTORY: MS, foley bag/cath  PAIN:  Are you having pain? No  PRECAUTIONS: Fall  WEIGHT BEARING RESTRICTIONS No  FALLS: Has patient fallen in last 6 months? No  LIVING ENVIRONMENT: Lives with: lives with their family and lives with their son Lives in: House/apartment Stairs: No Has following equipment at home: Environmental consultant - 2 wheeled, Wheelchair (manual), shower chair, and bed side commode  PLOF: Needs assistance with ADLs, Needs assistance with homemaking, Needs assistance with transfers, and Aide assists with all ADLs  PATIENT GOALS  Walk again, Transfer without a lift, sit on edge of bed.  OBJECTIVE:   DIAGNOSTIC FINDINGS:  IMPRESSION: Brain MRI:   1. Severe chronic demyelination without enhancing disease or clear progression. 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   Cervical MRI:   Confluent demyelination of the visible cord, subjectively progressed from 2020. No enhancing disease  COGNITION: Overall cognitive status: No  family/caregiver present to determine baseline cognitive functioning  Slow processing, short term memory deficits Oriented to month, unsure of year Oriented to situation, and place SENSATION: Light touch: Impaired   COORDINATION: Grossly abnormal with LEs; spasticity and tone FNF abnormal    MUSCLE TONE: LLE: Severe RLE Severe  MUSCLE LENGTH: In supine; Rt knee 74 degrees from full extension; Lt knee 78 deg from full extension Limited by tone and spasticity BIL    LOWER EXTREMITY ROM:     Limited assessment due to spasticity and tone; inability to maintain position.  LOWER EXTREMITY MMT:    MMT Right Eval Left Eval  Hip flexion    Hip extension    Hip abduction    Hip adduction    Hip internal rotation    Hip external rotation    Knee flexion    Knee extension    Ankle dorsiflexion    Ankle plantarflexion    Ankle inversion    Ankle eversion    (Blank rows = not tested)  BED MOBILITY:  Sit to supine Mod A Supine to sit Max A Rolling to Right Min A Rolling to Left Min A  TRANSFERS: Assistive device utilized: Wheelchair (manual)  Sit to stand: Total A Stand to sit:  Chair to chair: Total ATotal A     FUNCTIONAL TESTs:  See transfers abolve  PATIENT SURVEYS:   SF-36 Score Results Scale Score Result Physical functioning 5% Role limitations due to physical health 50% Role limitations due to emotional problems 100% Energy/fatigue 60% Emotional well-being 76% Social functioning 75% Pain 90% General health 25%   TODAY'S TREATMENT:  Eval SF-36 Education Transfer and bed mobility Seated balance Relaxation techniques, supine stretch for LE extension HEP    PATIENT EDUCATION: Education details: Findings, POC, positioning, PT role Person educated: Patient Education method: Explanation Education comprehension: verbalized understanding   HOME EXERCISE PROGRAM: Access Code: [email or web address] URL: [email or web address] Date:  01/18/2022 Prepared by: Candie Mile  Exercises - Seated Balance Activity: Lateral Reaching  - 3 x daily - 7 x weekly - 1 sets - 10 reps - Seated Balance Activity: Rotating at Trunk  - 3 x daily - 7 x weekly -  1 sets - 10 reps - Seated Balance Activity: Lateral Leans Elbow to Mat  - 1 x daily - 7 x weekly - 3 sets - 10 reps    GOALS: Goals reviewed with patient? Yes  SHORT TERM GOALS: Target date: 02/08/22  Patient will be independent with initial HEP and self-management strategies to improve functional outcomes Baseline: Initiated Goal status: INITIAL    LONG TERM GOALS: Target date: 03/01/22  Patient will be independent with advanced HEP and self-management strategies to improve functional outcomes Baseline:  Goal status: INITIAL  2.  Patient will Demonstrate ability to perform sit to sidelying/supine and roll at Mod I level to reduce burden of care and improve independence with mobility. Baseline: Max assist Goal status: INITIAL  3.  Patient will demonstrate ability to actively extend knees in supine to 0 degrees bilaterally for maintenance and prevention of contractures. Baseline: Rt 74 from zero, Lt 78 from zero Goal status: INITIAL  4. Patient will demonstrate improved seated balance for reduced burden of care from caregivers while bathing, by demonstrating ability to sit EOB x2 minutes with single UE support. Baseline: requires Min assist for seated balance. Goal status: INITIAL  5. Patient will demonstrate ability to self propel wheel chair >50 feet including 2 turns for greater independence with mobility.  Baseline: pt unable to push  ASSESSMENT:  CLINICAL IMPRESSION: Patient is a 31 y.o. female who was seen today for physical therapy evaluation and treatment for deficits related to her multiple sclerosis. Patient presents with deficits including wheelchair bound, reduced strength, limited range of motion, severe tone and spasticity, decreased endurance, limited  activity tolerance,  impaired seated balance, dependence with transfers,, contributing to impaired functional mobility with ADLs and IADLs. Patient is currently restricted in ADLs as indicated by objective and subjective functional outcome measures, as well as reported history and objective measures taken during this exam. Patient will benefit from skilled physical therapy intervention in order to improve function and reduce the impairments listed above.    OBJECTIVE IMPAIRMENTS decreased activity tolerance, decreased balance, decreased cognition, decreased coordination, decreased endurance, decreased knowledge of condition, decreased knowledge of use of DME, decreased mobility, difficulty walking, decreased ROM, decreased strength, hypomobility, increased fascial restrictions, impaired perceived functional ability, increased muscle spasms, impaired flexibility, impaired sensation, impaired tone, impaired UE functional use, and postural dysfunction.   ACTIVITY LIMITATIONS carrying, lifting, bending, sitting, standing, squatting, stairs, transfers, bed mobility, continence, bathing, toileting, dressing, hygiene/grooming, and locomotion level  PARTICIPATION LIMITATIONS: meal prep, cleaning, laundry, medication management, and community activity  PERSONAL FACTORS Time since onset of injury/illness/exacerbation, Transportation, and 1 comorbidity: Multiple sclerosis  are also affecting patient's functional outcome.   REHAB POTENTIAL: Fair progressive disease  CLINICAL DECISION MAKING: Unstable/unpredictable  EVALUATION COMPLEXITY: High  PLAN: PT FREQUENCY: 2x/week  PT DURATION: 6 weeks  PLANNED INTERVENTIONS: Therapeutic exercises, Therapeutic activity, Neuromuscular re-education, Balance training, Gait training, Patient/Family education, Self Care, Joint mobilization, Joint manipulation, Orthotic/Fit training, DME instructions, Wheelchair mobility training, Cryotherapy, Moist heat, Taping,  Biofeedback, Manual therapy, and Re-evaluation  PLAN FOR NEXT SESSION: (Consider other subjective and functional tests as appropriate - limited day of eval to assess multiple deficits.) Seated balance - trunk control; Gentle ROM, HEP advancement; Bed mobility, weight shifting/unloading techniques;  Progress with transfer training as tolerated. Strengthen as range/tone improves. Consider modification to w/c to better stabilize LEs (frequently falling off foot rest). Consider power w/c.   Kathlyn Sacramento, PT, DPT Physical Therapist Acute Rehabilitation Services [facility]  Outpatient Rehabilitation Services [facility]   01/18/2022, 1:01 PM

## 2022-01-20 DIAGNOSIS — G35 Multiple sclerosis: Secondary | ICD-10-CM | POA: Diagnosis not present

## 2022-01-23 DIAGNOSIS — G35 Multiple sclerosis: Secondary | ICD-10-CM | POA: Diagnosis not present

## 2022-01-27 ENCOUNTER — Telehealth: Payer: Self-pay

## 2022-01-27 NOTE — Telephone Encounter (Signed)
Patient states that she keeps having to go to the ER to have her catheter flushed because it keeps getting clogged and will not drain.  She is still flushing her catheter is on her own regularly.  Do you have any advice for the patient or do you want her to come in to be evaluated.

## 2022-01-27 NOTE — Telephone Encounter (Signed)
Patient scheduled with MD on 10/25 at 1120, pt aware.

## 2022-01-28 ENCOUNTER — Other Ambulatory Visit: Payer: Self-pay

## 2022-01-28 ENCOUNTER — Emergency Department (HOSPITAL_COMMUNITY)
Admission: EM | Admit: 2022-01-28 | Discharge: 2022-01-28 | Disposition: A | Discharge status: Home / Self Care | Payer: Medicare Other | Attending: Emergency Medicine | Admitting: Emergency Medicine

## 2022-01-28 ENCOUNTER — Encounter (HOSPITAL_COMMUNITY): Payer: Self-pay

## 2022-01-28 DIAGNOSIS — Y829 Unspecified medical devices associated with adverse incidents: Secondary | ICD-10-CM | POA: Diagnosis not present

## 2022-01-28 DIAGNOSIS — R5381 Other malaise: Secondary | ICD-10-CM | POA: Diagnosis not present

## 2022-01-28 DIAGNOSIS — Z743 Need for continuous supervision: Secondary | ICD-10-CM | POA: Diagnosis not present

## 2022-01-28 DIAGNOSIS — R279 Unspecified lack of coordination: Secondary | ICD-10-CM | POA: Diagnosis not present

## 2022-01-28 DIAGNOSIS — Z7401 Bed confinement status: Secondary | ICD-10-CM | POA: Diagnosis not present

## 2022-01-28 DIAGNOSIS — T83091A Other mechanical complication of indwelling urethral catheter, initial encounter: Secondary | ICD-10-CM | POA: Diagnosis not present

## 2022-01-28 DIAGNOSIS — T83010A Breakdown (mechanical) of cystostomy catheter, initial encounter: Secondary | ICD-10-CM

## 2022-01-28 DIAGNOSIS — T83098A Other mechanical complication of other indwelling urethral catheter, initial encounter: Secondary | ICD-10-CM | POA: Diagnosis not present

## 2022-01-28 DIAGNOSIS — T83198A Other mechanical complication of other urinary devices and implants, initial encounter: Secondary | ICD-10-CM | POA: Diagnosis not present

## 2022-01-28 DIAGNOSIS — R6889 Other general symptoms and signs: Secondary | ICD-10-CM | POA: Diagnosis not present

## 2022-01-28 NOTE — Discharge Instructions (Addendum)
18 French suprapubic catheter was replaced in the ER.  If she continues to have significant issues with blockage, consider using irrigation supplements such as Renacidin or flush the catheter more frequently.

## 2022-01-28 NOTE — ED Notes (Signed)
Called for the urology cart again.

## 2022-01-28 NOTE — ED Triage Notes (Signed)
Pt arrived [facility] c/o clogged foley catheter x 3 days.

## 2022-01-28 NOTE — ED Notes (Signed)
Called for the urology cart.

## 2022-01-28 NOTE — ED Provider Notes (Signed)
[facility] Provider Note   [name]: [id number] Arrival date & time: 01/28/22  1127     History  No chief complaint on file.   Melissa West is a 31 y.o. female.  HPI     31 year old female comes in with chief complaint of Foley catheter issues. Patient has history of multiple sclerosis, urinary retention and has a suprapubic catheter in place.  She states that the suprapubic catheter has not been draining for the last day or 2.  She suspects that it is likely clogged.  She has had recurrent issues with the suprapubic catheter, requiring ER visit at least once a month.  Last ED visit for the same complaint was 10-5.  She denies any pain in the back, pain in the abdomen, fevers, chills, nausea, vomiting.  Patient has low suspicion for UTI.  Home Medications Prior to Admission medications   Medication Sig Start Date End Date Taking? Authorizing Provider  KESIMPTA 20 MG/0.4ML SOAJ Inject 20 mg into the skin every 30 (thirty) days. 06/24/21  Yes [provider]  Syringe, Disposable, (50-60CC SYRINGE) 60 ML MISC Flush 30-50cc of sterile water into catheter daily as needed 01/15/22  Yes [name], Candee Furbish, MD  Water For Irrigation, Sterile (STERILE WATER FOR IRRIGATION) Irrigate with 50 mLs as directed daily. Irrigate catheter daily with 50cc of sterile water. 01/15/22  Yes [name], Candee Furbish, MD  acetaminophen (TYLENOL) 325 MG tablet Take 2 tablets (650 mg total) by mouth every 6 (six) hours as needed for mild pain (or Fever >/= 101). Patient not taking: Reported on 08/12/2021 05/20/21   Roxan Hockey, MD  AMBULATORY NON FORMULARY MEDICATION 30 mLs by Intracatheter route daily as needed. Medication Name: Sodium Chloride 0.9% 11/12/21   [name], Candee Furbish, MD  amoxicillin (AMOXIL) 500 MG capsule Take 1 capsule (500 mg total) by mouth 3 (three) times daily. Patient not taking: Reported on 12/15/2021 11/24/21   Fransico Meadow, PA-C  ergocalciferol (VITAMIN D2)  1.25 MG (50000 UT) capsule Take 1 capsule (50,000 Units total) by mouth once a week. Patient not taking: Reported on 12/15/2021 11/07/21   Roxan Hockey, MD  midodrine (PROAMATINE) 5 MG tablet Take 1 tablet (5 mg total) by mouth 3 (three) times daily with meals. To Help Prevent your Blood Pressure from dropping Patient not taking: Reported on 12/15/2021 11/07/21   Roxan Hockey, MD  nitrofurantoin, macrocrystal-monohydrate, (MACROBID) 100 MG capsule Take 1 capsule (100 mg total) by mouth 2 (two) times daily. Patient not taking: Reported on 01/28/2022 12/15/21   Dion Saucier A, PA  polyvinyl alcohol (LIQUIFILM TEARS) 1.4 % ophthalmic solution Place 1 drop into both eyes as needed for dry eyes. Patient not taking: Reported on 06/26/2021 06/20/21   Roxan Hockey, MD  senna-docusate (SENOKOT-S) 8.6-50 MG tablet Take 2 tablets by mouth at bedtime. Patient not taking: Reported on 01/28/2022 11/07/21 11/07/22  Roxan Hockey, MD      Allergies    Patient has no known allergies.    Review of Systems   Review of Systems  Physical Exam Updated Vital Signs BP 98/80   Pulse 94   Temp 97.7 F (36.5 C) (Oral)   Resp 17   Ht 5\' 3"  (1.6 m)   Wt 58.1 kg   SpO2 100%   BMI 22.69 kg/m  Physical Exam Vitals and nursing note reviewed.  Constitutional:      Appearance: She is well-developed.  HENT:     Head: Atraumatic.  Cardiovascular:  Rate and Rhythm: Normal rate.  Pulmonary:     Effort: Pulmonary effort is normal.  Abdominal:     Tenderness: There is no abdominal tenderness.  Musculoskeletal:     Cervical back: Normal range of motion and neck supple.  Skin:    General: Skin is warm and dry.  Neurological:     Mental Status: She is alert and oriented to person, place, and time.     ED Results / Procedures / Treatments   Labs (all labs ordered are listed, but only abnormal results are displayed) Labs Reviewed - No data to display  EKG None  Radiology No results  found.  Procedures SUPRAPUBIC TUBE PLACEMENT  Date/Time: 01/28/2022 3:08 PM  Performed by: Derwood Kaplan, MD Authorized by: Derwood Kaplan, MD   Consent:    Consent obtained:  Verbal   Consent given by:  Patient   Risks, benefits, and alternatives were discussed: yes     Risks discussed:  Bleeding, bowel perforation, infection and pain   Alternatives discussed:  No treatment Universal protocol:    Procedure explained and questions answered to patient or proxy's satisfaction: yes     Immediately prior to procedure, a time out was called: yes     Patient identity confirmed:  Arm band Sedation:    Sedation type:  None Anesthesia:    Anesthesia method:  None Procedure details:    Complexity:  Simple   Catheter type:  Foley   Catheter size:  18 Fr   Ultrasound guidance: no     Number of attempts:  1   Urine characteristics:  Clear Post-procedure details:    Procedure completion:  Tolerated well, no immediate complications     Medications Ordered in ED Medications - No data to display  ED Course/ Medical Decision Making/ A&P                           Medical Decision Making This patient presents to the ED with chief complaint(s) of acute urinary retention with pertinent past medical history of multiple sclerosis with chronic urinary retention requiring suprapubic catheter.The complaint involves an extensive differential diagnosis and also carries with it a high risk of complications and morbidity.    Patient was last seen in the ER on 10-5 with same complaint.  Prior to that she was seen on 9-5.  The differential diagnosis includes urinary retention due to mechanical obstruction of the suprapubic catheter, worsening MS, suprapubic catheter not being in appropriate space anatomically. Although I considered UTI in the differential, patient denies any lower quadrant abdominal pain, back pain, nausea, vomiting.  She denies being suspicious of UTI. I reviewed patient's prior  cultures, she always has grown E. coli that has been pansensitive.  Clinical suspicion for UTI is low, we will not get urine analysis.  The initial plan is to replace the suprapubic catheter.   Additional history obtained: Additional history obtained from EMS  Records reviewed  previous culture results, ER visit results.   Treatment and Reassessment: Suprapubic catheter placed without any complications.  18 French Foley catheter placed.  We had appropriate urine output.  Nursing staff have secure the catheter.  Final Clinical Impression(s) / ED Diagnoses Final diagnoses:  Suprapubic catheter dysfunction, initial encounter [facility])    Rx / DC Orders ED Discharge Orders     None         Derwood Kaplan, MD 01/28/22 1513

## 2022-02-03 ENCOUNTER — Ambulatory Visit: Payer: Medicare Other | Admitting: Physician Assistant

## 2022-02-03 ENCOUNTER — Ambulatory Visit (INDEPENDENT_AMBULATORY_CARE_PROVIDER_SITE_OTHER): Payer: Medicare Other | Admitting: Urology

## 2022-02-03 ENCOUNTER — Encounter: Payer: Self-pay | Admitting: Urology

## 2022-02-03 VITALS — BP 105/70 | HR 97

## 2022-02-03 DIAGNOSIS — R339 Retention of urine, unspecified: Secondary | ICD-10-CM | POA: Diagnosis not present

## 2022-02-03 DIAGNOSIS — Z9889 Other specified postprocedural states: Secondary | ICD-10-CM

## 2022-02-03 MED ORDER — ACETIC ACID 0.25 % IR SOLN: Freq: Every day | 12 refills | Status: DC

## 2022-02-03 NOTE — Addendum Note (Signed)
Addended by: Cleon Gustin on: 02/03/2022 12:44 PM   Modules accepted: Orders

## 2022-02-03 NOTE — Patient Instructions (Signed)
Acute Urinary Retention, Female  Acute urinary retention is when a person cannot pee (urinate) at all, or can only pee a little. This can come on all of a sudden. If it is not treated, it can lead to kidney problems or other serious problems. What are the causes? A problem with the tube that drains the bladder (urethra). Problems with the nerves in the bladder. The organs in the area between your hip bones (pelvis) slipping out of place (prolapse). Tumors. The birth of a baby through the vagina. An infection. Having trouble pooping (constipation). Certain medicines. What increases the risk? Women over age 50 are more at risk. Other conditions also can increase risk. These include: Diseases, such as multiple sclerosis. Injury to the spinal cord. Diabetes. A condition that affects the way the brain works, such as dementia. Holding back urine due to trauma or because you do not want to use the bathroom. History of not being able to pee or peeing too little. Having had surgery in the area between your hip bones. What are the signs or symptoms? Trouble peeing. Pain in the lower belly. How is this treated? Treatment for this condition may include: Medicines. Placing a thin, germ-free tube (catheter) into the bladder to drain pee out of the body. Therapy to treat mental health conditions. Treatment for conditions that may cause this. If needed, you may be treated in the hospital for kidney problems or to manage other problems. Follow these instructions at home: Medicines Take over-the-counter and prescription medicines only as told by your doctor. Ask your doctor what medicines you should stay away from. If you were given an antibiotic medicine, take it as told by your doctor. Do not stop taking it even if you start to feel better. General instructions Do not smoke or use any products that contain nicotine or tobacco. If you need help quitting, ask your doctor. Drink enough fluid to  keep your pee pale yellow. If you were sent home with a tube that drains the bladder, take care of it as told by your doctor. Watch for changes in your symptoms. Tell your doctor about them. If told, keep track of changes in your blood pressure at home. Tell your doctor about them. Keep all follow-up visits. Contact a doctor if: You have spasms in your bladder that you cannot stop. You leak pee when you have spasms. Get help right away if: You have chills or a fever. You have blood in your pee. You have a tube that drains pee from the bladder and these things happen: The tube stops draining pee. The tube falls out. Summary Acute urinary retention is when you cannot pee at all or you pee too little. If this is not treated, it can cause kidney problems or other serious problems. If you were sent home with a tube (catheter) that drains pee from the bladder, take care of it as told by your doctor. Watch for changes in your symptoms. Tell your doctor about them. This information is not intended to replace advice given to you by your health care provider. Make sure you discuss any questions you have with your health care provider. Document Revised: 12/19/2019 Document Reviewed: 12/19/2019 Elsevier Patient Education  2023 [employer]  

## 2022-02-03 NOTE — Progress Notes (Signed)
02/03/2022 12:19 PM   Melissa West [birth year] [id number]  Referring provider: Celene Squibb, MD [address] Melissa West,  [place] [address]  Suprapubic tube dysfunction   HPI: Ms Melissa West is a 31yo here for followup for urinary retention and chronic indwelling foley. She is currently flushing the catheter with 10cc of normal saline daily. She has been to the ER multiple times in the past 3 months for a clogged SP tube.    PMH: Past Medical History:  Diagnosis Date   MS (multiple sclerosis) ([place])    No pertinent past medical history     Surgical History: Past Surgical History:  Procedure Laterality Date   CESAREAN SECTION  08/09/2011   Procedure: CESAREAN SECTION;  Surgeon: Melissa Bunde, MD;  Location: [facility];  Service: Gynecology;  Laterality: N/A;   IR CATHETER TUBE CHANGE  07/22/2021   IR CATHETER TUBE CHANGE  09/10/2021    Home Medications:  Allergies as of 02/03/2022   No Known Allergies      Medication List        Accurate as of February 03, 2022 12:19 PM. If you have any questions, ask your nurse or doctor.          50-60CC SYRINGE 60 ML Misc Flush 30-50cc of sterile water into catheter daily as needed   acetaminophen 325 MG tablet Commonly known as: TYLENOL Take 2 tablets (650 mg total) by mouth every 6 (six) hours as needed for mild pain (or Fever >/= 101).   AMBULATORY NON FORMULARY MEDICATION 30 mLs by Intracatheter route daily as needed. Medication Name: Sodium Chloride 0.9%   amoxicillin 500 MG capsule Commonly known as: AMOXIL Take 1 capsule (500 mg total) by mouth 3 (three) times daily.   ergocalciferol 1.25 MG (50000 UT) capsule Commonly known as: VITAMIN D2 Take 1 capsule (50,000 Units total) by mouth once a week.   Kesimpta 20 MG/0.4ML Soaj Generic drug: Ofatumumab Inject 20 mg into the skin every 30 (thirty) days.   midodrine 5 MG tablet Commonly known as: PROAMATINE Take 1 tablet (5 mg total) by mouth 3 (three) times  daily with meals. To Help Prevent your Blood Pressure from dropping   nitrofurantoin (macrocrystal-monohydrate) 100 MG capsule Commonly known as: MACROBID Take 1 capsule (100 mg total) by mouth 2 (two) times daily.   polyvinyl alcohol 1.4 % ophthalmic solution Commonly known as: LIQUIFILM TEARS Place 1 drop into both eyes as needed for dry eyes.   senna-docusate 8.6-50 MG tablet Commonly known as: Senokot-S Take 2 tablets by mouth at bedtime.   sterile water for irrigation Irrigate with 50 mLs as directed daily. Irrigate catheter daily with 50cc of sterile water.        Allergies: No Known Allergies  Family History: Family History  Problem Relation Age of Onset   Diabetes Maternal Grandmother    Hypertension Maternal Grandmother    Kidney disease Maternal Grandmother     Social History:  reports that she has never smoked. She has never used smokeless tobacco. She reports that she does not drink alcohol and does not use drugs.  ROS: All other review of systems were reviewed and are negative except what is noted above in HPI  Physical Exam: BP 105/70   Pulse 97   Constitutional:  Alert and oriented, No acute distress. HEENT: Melissa [place] AT, moist mucus membranes.  Trachea midline, no masses. Cardiovascular: No clubbing, cyanosis, or edema. Respiratory: Normal respiratory effort, no increased work of breathing. GI: Abdomen  is soft, nontender, nondistended, no abdominal masses GU: No CVA tenderness.  Lymph: No cervical or inguinal lymphadenopathy. Skin: No rashes, bruises or suspicious lesions. Neurologic: Grossly intact, no focal deficits, moving all 4 extremities. Psychiatric: Normal mood and affect.  Laboratory Data: Lab Results  Component Value Date   WBC 3.1 (L) 12/15/2021   HGB 11.7 (L) 12/15/2021   HCT 35.9 (L) 12/15/2021   MCV 87.1 12/15/2021   PLT 269 12/15/2021    Lab Results  Component Value Date   CREATININE 0.69 12/15/2021    No results found for:  "PSA"  No results found for: "TESTOSTERONE"  No results found for: "HGBA1C"  Urinalysis    Component Value Date/Time   COLORURINE YELLOW 12/15/2021 1630   APPEARANCEUR CLOUDY (A) 12/15/2021 1630   LABSPEC 1.018 12/15/2021 1630   PHURINE 8.0 12/15/2021 1630   GLUCOSEU NEGATIVE 12/15/2021 1630   HGBUR MODERATE (A) 12/15/2021 1630   BILIRUBINUR NEGATIVE 12/15/2021 1630   KETONESUR NEGATIVE 12/15/2021 1630   PROTEINUR >=300 (A) 12/15/2021 1630   UROBILINOGEN 0.2 06/13/2009 1128   NITRITE POSITIVE (A) 12/15/2021 1630   LEUKOCYTESUR LARGE (A) 12/15/2021 1630    Lab Results  Component Value Date   BACTERIA MANY (A) 12/15/2021    Pertinent Imaging:  No results found for this or any previous visit.  No results found for this or any previous visit.  No results found for this or any previous visit.  No results found for this or any previous visit.  No results found for this or any previous visit.  No valid procedures specified. No results found for this or any previous visit.  Results for orders placed during the hospital encounter of 05/14/21  CT RENAL STONE STUDY  Narrative CLINICAL DATA:  Sepsis, urinary retention, indwelling Foley catheter, generalized weakness and chills  EXAM: CT ABDOMEN AND PELVIS WITHOUT CONTRAST  TECHNIQUE: Multidetector CT imaging of the abdomen and pelvis was performed following the standard protocol without IV contrast.  RADIATION DOSE REDUCTION: This exam was performed according to the departmental dose-optimization program which includes automated exposure control, adjustment of the mA and/or kV according to patient size and/or use of iterative reconstruction technique.  COMPARISON:  03/07/2021  FINDINGS: Lower chest: No acute pleural or parenchymal lung disease.  Hepatobiliary: Calcified gallstones without evidence of cholecystitis. Unremarkable unenhanced appearance of the liver.  Pancreas: Unremarkable unenhanced  appearance.  Spleen: Unremarkable unenhanced appearance.  Adrenals/Urinary Tract: Bladder is decompressed with a Foley catheter. No urinary tract calculi or obstructive uropathy within either kidney. Evaluation of the renal parenchyma is limited without IV contrast. The adrenals are stable.  Stomach/Bowel: No bowel obstruction or ileus. Normal appendix right lower quadrant. No bowel wall thickening or inflammatory change.  Vascular/Lymphatic: No significant vascular findings are present. No enlarged abdominal or pelvic lymph nodes.  Reproductive: Uterus and bilateral adnexa are unremarkable.  Other: Trace free fluid in the pelvis may be physiologic. No free intraperitoneal gas. No abdominal wall hernia.  Musculoskeletal: No acute or destructive bony lesions. Reconstructed images demonstrate no additional findings.  IMPRESSION: 1. Cholelithiasis without cholecystitis. 2. No evidence of urinary tract calculi or obstructive uropathy. Indwelling Foley catheter decompresses the bladder. 3. Trace pelvic free fluid, likely physiologic.   Electronically Signed By: Sharlet Salina M.D. On: 05/14/2021 17:14   Assessment & Plan:    1. History of suprapubic catheter -We will start acetic acid irrigation 30cc daily  2. Urinary retention -continue SP tube   No follow-ups on file.  Luisa Hart  [place], [facility] [place]

## 2022-02-12 ENCOUNTER — Ambulatory Visit (HOSPITAL_COMMUNITY): Payer: Medicare Other

## 2022-02-16 ENCOUNTER — Telehealth (HOSPITAL_COMMUNITY): Payer: Self-pay | Admitting: Physical Therapy

## 2022-02-16 ENCOUNTER — Ambulatory Visit (HOSPITAL_COMMUNITY): Payer: Medicare Other | Admitting: Physical Therapy

## 2022-02-16 NOTE — Telephone Encounter (Signed)
Patient no show, front desk staff stated patient phoned in to state that she had transportation issues.  11:56 AM, 02/16/22 Mearl Latin PT, DPT Physical Therapist at [facility]

## 2022-02-18 ENCOUNTER — Ambulatory Visit (HOSPITAL_COMMUNITY): Payer: Medicare Other | Attending: Family Medicine | Admitting: Physical Therapy

## 2022-02-18 ENCOUNTER — Encounter (HOSPITAL_COMMUNITY): Payer: Self-pay | Admitting: Physical Therapy

## 2022-02-18 DIAGNOSIS — M6281 Muscle weakness (generalized): Secondary | ICD-10-CM | POA: Diagnosis not present

## 2022-02-18 DIAGNOSIS — R262 Difficulty in walking, not elsewhere classified: Secondary | ICD-10-CM | POA: Diagnosis not present

## 2022-02-18 DIAGNOSIS — G35 Multiple sclerosis: Secondary | ICD-10-CM | POA: Diagnosis not present

## 2022-02-18 NOTE — Therapy (Signed)
OUTPATIENT PHYSICAL THERAPY NEURO EVALUATION   Patient Name: Melissa West MRN: [id number] DOB:1990/10/07, 31 y.o., female Today's Date: 02/18/2022   PCP: Nita Sells, MD REFERRING PROVIDER: Shon Hale, MD    PT End of Session - 02/18/22 1350     Visit Number 2    Number of Visits 12    Date for PT Re-Evaluation 03/01/22    Authorization Type [employer] Medicare - Medicaid [place] secondary    Authorization Time Period "No auth, no VL"    Progress Note Due on Visit 10    PT Start Time 1349    PT Stop Time 1427    PT Time Calculation (min) 38 min    Activity Tolerance Other (comment)   Significant limitations in functional mobility   Behavior During Therapy WFL for tasks assessed/performed             Past Medical History:  Diagnosis Date   MS (multiple sclerosis) ([facility])    No pertinent past medical history    Past Surgical History:  Procedure Laterality Date   CESAREAN SECTION  08/09/2011   Procedure: CESAREAN SECTION;  Surgeon: Lesly Dukes, MD;  Location: [facility];  Service: Gynecology;  Laterality: N/A;   IR CATHETER TUBE CHANGE  07/22/2021   IR CATHETER TUBE CHANGE  09/10/2021   Patient Active Problem List   Diagnosis Date Noted   UTI (urinary tract infection) 11/04/2021   Osteomyelitis ([facility]CC) 11/04/2021   Sepsis ([facility]) 06/17/2021   Chronic anemia 05/20/2021   Abnormal finding of diagnostic imaging--Abnormal CT AP 05/19/2021   Bacteremia 05/17/2021    Class: Acute   Chest pain 05/16/2021   Hypotension 05/15/2021    Class: Acute   Sepsis secondary to UTI ([facility]CC) 05/14/2021   Leukocytosis 03/08/2021   Acute on chronic urinary retention 03/08/2021   Pressure injury of skin 03/08/2021   AKI (acute kidney injury) ([facility]CC) 03/07/2021   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 Significant decline in function approx 3 months ago  REFERRING DIAG: G35 (ICD-10-CM) -  MS (multiple sclerosis) ([facility])   THERAPY DIAG:  Muscle weakness (generalized)  Difficulty in walking, not elsewhere classified  Multiple sclerosis ([facility]CC)  Rationale for Evaluation and Treatment Rehabilitation  SUBJECTIVE:                                                                                                                                                                                              SUBJECTIVE STATEMENT: Everything is fine. Wants to walk again.  PERTINENT HISTORY: MS, foley bag/cath  PAIN:  Are you having pain? No  PRECAUTIONS: Fall  WEIGHT BEARING RESTRICTIONS No  FALLS: Has patient fallen in last 6 months? No  LIVING ENVIRONMENT: Lives with: lives with their family and lives with their son Lives in: House/apartment Stairs: No Has following equipment at home: Environmental consultant - 2 wheeled, Wheelchair (manual), shower chair, and bed side commode  PLOF: Needs assistance with ADLs, Needs assistance with homemaking, Needs assistance with transfers, and Aide assists with all ADLs  PATIENT GOALS  Walk again, Transfer without a lift, sit on edge of bed.  OBJECTIVE:   DIAGNOSTIC FINDINGS:  IMPRESSION: Brain MRI:   1. Severe chronic demyelination without enhancing disease or clear progression. 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   Cervical MRI:   Confluent demyelination of the visible cord, subjectively progressed from 2020. No enhancing disease  COGNITION: Overall cognitive status: No family/caregiver present to determine baseline cognitive functioning  Slow processing, short term memory deficits Oriented to month, unsure of year Oriented to situation, and place SENSATION: Light touch: Impaired   COORDINATION: Grossly abnormal with LEs; spasticity and tone FNF abnormal    MUSCLE TONE: LLE: Severe RLE Severe  MUSCLE LENGTH: In supine; Rt knee 74 degrees from full extension; Lt knee 78 deg from full  extension Limited by tone and spasticity BIL    LOWER EXTREMITY ROM:     Limited assessment due to spasticity and tone; inability to maintain position.  LOWER EXTREMITY MMT:    MMT Right Eval Left Eval  Hip flexion    Hip extension    Hip abduction    Hip adduction    Hip internal rotation    Hip external rotation    Knee flexion    Knee extension    Ankle dorsiflexion    Ankle plantarflexion    Ankle inversion    Ankle eversion    (Blank rows = not tested)  BED MOBILITY:  Sit to supine Mod A Supine to sit Max A Rolling to Right Min A Rolling to Left Min A  TRANSFERS: Assistive device utilized: Wheelchair (manual)  Sit to stand: Total A Stand to sit:  Chair to chair: Total ATotal A     FUNCTIONAL TESTs:  See transfers abolve  PATIENT SURVEYS:   SF-36 Score Results Scale Score Result Physical functioning 5% Role limitations due to physical health 50% Role limitations due to emotional problems 100% Energy/fatigue 60% Emotional well-being 76% Social functioning 75% Pain 90% General health 25%   TODAY'S TREATMENT:  12/21/21 Seated lateral reaching 2x 10 bilateral Seated rotation with reaching 2 x 10 bilateral  Lateral leaning with min assist for balance 2x 10 bilateral  Row RTB 2x10 Shoulder extension RTB 2x 10 Overhead reach 2x 10 bilateral   Eval SF-36 Education Transfer and bed mobility Seated balance Relaxation techniques, supine stretch for LE extension HEP    PATIENT EDUCATION: Education details: 02/18/22: HEP pressure relief; Eval Findings, POC, positioning, PT role Person educated: Patient Education method: Explanation Education comprehension: verbalized understanding   HOME EXERCISE PROGRAM: Access Code: [email or web address] URL: [email or web address] 02/18/22 - Seated Shoulder Row with Anchored Resistance  - 1 x daily - 7 x weekly - 2 sets - 10 reps - Seated Shoulder Extension and Scapular Retraction with Resistance  - 1 x  daily - 7 x weekly - 2 sets - 10 reps  Date: 01/18/2022 - Seated Balance Activity: Lateral Reaching  - 3 x daily - 7 x weekly -  1 sets - 10 reps - Seated Balance Activity: Rotating at Trunk  - 3 x daily - 7 x weekly - 1 sets - 10 reps - Seated Balance Activity: Lateral Leans Elbow to Mat  - 1 x daily - 7 x weekly - 3 sets - 10 reps    GOALS: Goals reviewed with patient? Yes  SHORT TERM GOALS: Target date: 02/08/22  Patient will be independent with initial HEP and self-management strategies to improve functional outcomes Baseline: Initiated Goal status: INITIAL    LONG TERM GOALS: Target date: 03/01/22  Patient will be independent with advanced HEP and self-management strategies to improve functional outcomes Baseline:  Goal status: INITIAL  2.  Patient will Demonstrate ability to perform sit to sidelying/supine and roll at Mod I level to reduce burden of care and improve independence with mobility. Baseline: Max assist Goal status: INITIAL  3.  Patient will demonstrate ability to actively extend knees in supine to 0 degrees bilaterally for maintenance and prevention of contractures. Baseline: Rt 74 from zero, Lt 78 from zero Goal status: INITIAL  4. Patient will demonstrate improved seated balance for reduced burden of care from caregivers while bathing, by demonstrating ability to sit EOB x2 minutes with single UE support. Baseline: requires Min assist for seated balance. Goal status: INITIAL  5. Patient will demonstrate ability to self propel wheel chair >50 feet including 2 turns for greater independence with mobility.  Baseline: pt unable to push  ASSESSMENT:  CLINICAL IMPRESSION: Session with focus on seated balance and postural control. Patient with impaired sitting balance requiring assist or pillows for sitting balance and posture. Began session with previously completed exercises with intermittent cueing for mechanics. Added resisted postural and UE strengthening  with bands which is tolerated well. Patient LE mobility limited by spasticity and tone. Patient will continue to benefit from physical therapy in order to improve function and reduce impairment.     OBJECTIVE IMPAIRMENTS decreased activity tolerance, decreased balance, decreased cognition, decreased coordination, decreased endurance, decreased knowledge of condition, decreased knowledge of use of DME, decreased mobility, difficulty walking, decreased ROM, decreased strength, hypomobility, increased fascial restrictions, impaired perceived functional ability, increased muscle spasms, impaired flexibility, impaired sensation, impaired tone, impaired UE functional use, and postural dysfunction.   ACTIVITY LIMITATIONS carrying, lifting, bending, sitting, standing, squatting, stairs, transfers, bed mobility, continence, bathing, toileting, dressing, hygiene/grooming, and locomotion level  PARTICIPATION LIMITATIONS: meal prep, cleaning, laundry, medication management, and community activity  PERSONAL FACTORS Time since onset of injury/illness/exacerbation, Transportation, and 1 comorbidity: Multiple sclerosis  are also affecting patient's functional outcome.   REHAB POTENTIAL: Fair progressive disease  CLINICAL DECISION MAKING: Unstable/unpredictable  EVALUATION COMPLEXITY: High  PLAN: PT FREQUENCY: 2x/week  PT DURATION: 6 weeks  PLANNED INTERVENTIONS: Therapeutic exercises, Therapeutic activity, Neuromuscular re-education, Balance training, Gait training, Patient/Family education, Self Care, Joint mobilization, Joint manipulation, Orthotic/Fit training, DME instructions, Wheelchair mobility training, Cryotherapy, Moist heat, Taping, Biofeedback, Manual therapy, and Re-evaluation  PLAN FOR NEXT SESSION: (Consider other subjective and functional tests as appropriate - limited day of eval to assess multiple deficits.) Seated balance - trunk control; Gentle ROM, HEP advancement; Bed mobility, weight  shifting/unloading techniques;  Progress with transfer training as tolerated. Strengthen as range/tone improves. Consider modification to w/c to better stabilize LEs (frequently falling off foot rest). Consider power w/c.  1:51 PM, 02/18/22 Wyman Songster PT, DPT Physical Therapist at [facility]

## 2022-02-20 DIAGNOSIS — G35 Multiple sclerosis: Secondary | ICD-10-CM | POA: Diagnosis not present

## 2022-02-22 ENCOUNTER — Other Ambulatory Visit: Payer: Self-pay

## 2022-02-22 ENCOUNTER — Encounter (HOSPITAL_COMMUNITY): Payer: Self-pay | Admitting: Emergency Medicine

## 2022-02-22 ENCOUNTER — Emergency Department (HOSPITAL_COMMUNITY)
Admission: EM | Admit: 2022-02-22 | Discharge: 2022-02-22 | Disposition: A | Discharge status: Home / Self Care | Payer: Medicare Other | Attending: Emergency Medicine | Admitting: Emergency Medicine

## 2022-02-22 DIAGNOSIS — T83098A Other mechanical complication of other indwelling urethral catheter, initial encounter: Secondary | ICD-10-CM | POA: Diagnosis not present

## 2022-02-22 DIAGNOSIS — R339 Retention of urine, unspecified: Secondary | ICD-10-CM | POA: Insufficient documentation

## 2022-02-22 DIAGNOSIS — R0689 Other abnormalities of breathing: Secondary | ICD-10-CM | POA: Diagnosis not present

## 2022-02-22 DIAGNOSIS — Z743 Need for continuous supervision: Secondary | ICD-10-CM | POA: Diagnosis not present

## 2022-02-22 DIAGNOSIS — R279 Unspecified lack of coordination: Secondary | ICD-10-CM | POA: Diagnosis not present

## 2022-02-22 DIAGNOSIS — R52 Pain, unspecified: Secondary | ICD-10-CM | POA: Diagnosis not present

## 2022-02-22 DIAGNOSIS — T83091A Other mechanical complication of indwelling urethral catheter, initial encounter: Secondary | ICD-10-CM | POA: Diagnosis not present

## 2022-02-22 DIAGNOSIS — Z7401 Bed confinement status: Secondary | ICD-10-CM | POA: Diagnosis not present

## 2022-02-22 LAB — URINALYSIS, ROUTINE W REFLEX MICROSCOPIC
Bilirubin Urine: NEGATIVE
Glucose, UA: NEGATIVE mg/dL
Ketones, ur: NEGATIVE mg/dL
Nitrite: NEGATIVE
Protein, ur: >=300 mg/dL — AB
RBC / HPF: >50 RBC/hpf — ABNORMAL HIGH (ref 0–5)
Specific Gravity, Urine: 1.022 (ref 1.005–1.030)
pH: 6 (ref 5.0–8.0)

## 2022-02-22 NOTE — ED Provider Notes (Cosign Needed Addendum)
[facility] EMERGENCY DEPARTMENT Provider Note   [name]: [id number] Arrival date & time: 02/22/22  1157     History  Chief Complaint  Patient presents with   Urinary Retention    Melissa West is a 31 y.o. female with a history of MS, wheelchair dependent, history of urinary retention requiring the use of a suprapubic catheter presenting for evaluation of blockage of her catheter.  She reports frequent episodes of this, most recently was seen by her urologist Dr. Alyson Ingles last month at which time he recommended she uses an acetic acid flush to help keep the tube clean.  She reports she has not had any urine in the bag x2 days but it has been leaking around the catheter tube.  She denies fevers or chills, nausea or vomiting.  She does have a history of UTIs, was most recently treated for UTI with Macrobid early September.  The history is provided by the patient.       Home Medications Prior to Admission medications   Medication Sig Start Date End Date Taking? Authorizing Provider  ergocalciferol (VITAMIN D2) 1.25 MG (50000 UT) capsule Take 1 capsule (50,000 Units total) by mouth once a week. 11/07/21  Yes Emokpae, Courage, MD  KESIMPTA 20 MG/0.4ML SOAJ Inject 20 mg into the skin every 30 (thirty) days. 06/24/21  Yes [provider]  acetaminophen (TYLENOL) 325 MG tablet Take 2 tablets (650 mg total) by mouth every 6 (six) hours as needed for mild pain (or Fever >/= 101). Patient not taking: Reported on 08/12/2021 05/20/21   Roxan Hockey, MD  acetic acid 0.25 % irrigation Irrigate with as directed daily. Irrigate 60 daily 02/03/22   [name], Candee Furbish, MD  AMBULATORY NON FORMULARY MEDICATION 30 mLs by Intracatheter route daily as needed. Medication Name: Sodium Chloride 0.9% 11/12/21   [name], Candee Furbish, MD  amoxicillin (AMOXIL) 500 MG capsule Take 1 capsule (500 mg total) by mouth 3 (three) times daily. Patient not taking: Reported on 12/15/2021 11/24/21   Fransico Meadow,  PA-C  midodrine (PROAMATINE) 5 MG tablet Take 1 tablet (5 mg total) by mouth 3 (three) times daily with meals. To Help Prevent your Blood Pressure from dropping Patient not taking: Reported on 12/15/2021 11/07/21   Roxan Hockey, MD  nitrofurantoin, macrocrystal-monohydrate, (MACROBID) 100 MG capsule Take 1 capsule (100 mg total) by mouth 2 (two) times daily. Patient not taking: Reported on 01/28/2022 12/15/21   Dion Saucier A, PA  polyvinyl alcohol (LIQUIFILM TEARS) 1.4 % ophthalmic solution Place 1 drop into both eyes as needed for dry eyes. Patient not taking: Reported on 06/26/2021 06/20/21   Roxan Hockey, MD  senna-docusate (SENOKOT-S) 8.6-50 MG tablet Take 2 tablets by mouth at bedtime. Patient not taking: Reported on 01/28/2022 11/07/21 11/07/22  Roxan Hockey, MD  Syringe, Disposable, (50-60CC SYRINGE) 60 ML MISC Flush 30-50cc of sterile water into catheter daily as needed 01/15/22   Alyson Ingles Candee Furbish, MD  Water For Irrigation, Sterile (STERILE WATER FOR IRRIGATION) Irrigate with 50 mLs as directed daily. Irrigate catheter daily with 50cc of sterile water. 01/15/22   [name], Candee Furbish, MD      Allergies    Patient has no known allergies.    Review of Systems   Review of Systems  Constitutional:  Negative for chills and fever.  HENT:  Negative for congestion and sore throat.   Eyes: Negative.   Respiratory:  Negative for chest tightness and shortness of breath.   Cardiovascular:  Negative for chest pain.  Gastrointestinal:  Negative for abdominal pain, nausea and vomiting.  Genitourinary:  Positive for difficulty urinating and hematuria. Negative for dysuria, flank pain and pelvic pain.  Musculoskeletal:  Negative for arthralgias, joint swelling and neck pain.  Skin: Negative.  Negative for rash and wound.  Neurological:  Negative for dizziness, weakness, light-headedness, numbness and headaches.  Psychiatric/Behavioral: Negative.      Physical Exam Updated Vital  Signs BP (!) 113/102   Pulse 80   Temp 97.6 F (36.4 C) (Oral)   Resp 18   LMP 01/25/2022 (Approximate)   SpO2 100%  Physical Exam Vitals and nursing note reviewed.  Constitutional:      Appearance: She is well-developed.  HENT:     Head: Normocephalic and atraumatic.  Eyes:     Conjunctiva/sclera: Conjunctivae normal.  Cardiovascular:     Rate and Rhythm: Normal rate and regular rhythm.     Heart sounds: Normal heart sounds.  Pulmonary:     Effort: Pulmonary effort is normal.     Breath sounds: Normal breath sounds. No wheezing.  Abdominal:     General: Bowel sounds are normal.     Palpations: Abdomen is soft.     Tenderness: There is no abdominal tenderness.     Comments: Suprapubic catheter is in place.  No urine within the bag.  The proximal tube does appear somewhat corroded.  There is no erythema or drainage currently from around the ostomy site.  Musculoskeletal:        General: Normal range of motion.     Cervical back: Normal range of motion.  Skin:    General: Skin is warm and dry.  Neurological:     Mental Status: She is alert.     ED Results / Procedures / Treatments   Labs (all labs ordered are listed, but only abnormal results are displayed) Labs Reviewed  URINALYSIS, ROUTINE W REFLEX MICROSCOPIC - Abnormal; Notable for the following components:      Result Value   Color, Urine AMBER (*)    APPearance CLOUDY (*)    Hgb urine dipstick LARGE (*)    Protein, ur >=300 (*)    Leukocytes,Ua MODERATE (*)    RBC / HPF >50 (*)    Bacteria, UA RARE (*)    All other components within normal limits    EKG None  Radiology No results found.  Procedures SUPRAPUBIC TUBE PLACEMENT  Date/Time: 02/22/2022 4:54 PM  Performed by: Burgess Amor, PA-C Authorized by: Burgess Amor, PA-C   Consent:    Consent obtained:  Verbal   Consent given by:  Patient   Risks, benefits, and alternatives were discussed: yes     Risks discussed:  Bleeding, bowel perforation  and infection Universal protocol:    Patient identity confirmed:  Verbally with patient Sedation:    Sedation type:  None Procedure details:    Complexity:  Simple   Catheter type:  Foley   Catheter size:  16 Fr   Ultrasound guidance: no     Number of attempts:  1   Urine characteristics:  Blood-tinged Post-procedure details:    Procedure completion:  Tolerated well, no immediate complications     Medications Ordered in ED Medications - No data to display  ED Course/ Medical Decision Making/ A&P                           Medical Decision Making Patient with a return of her blocked suprapubic catheter,  this appears to be occurring frequently at least once per month.  She is recently started using acetic acid flushes at the request of Dr. Alyson Ingles.  There is no obvious UTI on today's urine sample, although there is a fair amount of red blood cells which I suspect is related to the catheter change, she is nitrite negative and there is no bacteria present in the new urine sample collected after the catheter change.  A urine culture has been sent however to confirm patient does not need an antibiotic at this time.  He was advised close follow-up with Dr. Alyson Ingles for any ongoing problems or concerns, return precautions were outlined as well.  Of note, patient also had several low blood pressures while here, however patient states she generally runs a low blood pressure.  Review of chart confirms this.  She is afebrile, today's vital signs are consistent with prior ED visits.  Amount and/or Complexity of Data Reviewed Labs: ordered.    Details: Per above.           Final Clinical Impression(s) / ED Diagnoses Final diagnoses:  Obstruction of Foley catheter, initial encounter [facility])    Rx / [place] Orders ED Discharge Orders     None         Landis Martins 02/22/22 1657    Evalee Jefferson, PA-C 02/22/22 1658    Tretha Sciara, MD 02/23/22 [phone]

## 2022-02-22 NOTE — Discharge Instructions (Signed)
Follow-up with Dr. Ronne Binning as needed.  I do recommend continuing with the acetic acid flushes which he recommended at your last office visit with him to help reduce the risk of your catheter getting blocked again.  There is no obvious infection in your urine today, however a culture has been sent and if it grows a suspicious bacteria we will call you and call you in a prescription if needed at that time.

## 2022-02-22 NOTE — ED Notes (Signed)
Attempted to flush cath, would not flush and shot back out of distal port. Materials called for 57ffoley cath kit.

## 2022-02-22 NOTE — ED Triage Notes (Signed)
Pt c/o urinary cath blockage, last seen urine in bag 2 days ago.

## 2022-02-23 ENCOUNTER — Ambulatory Visit (HOSPITAL_COMMUNITY): Payer: Medicare Other | Admitting: Physical Therapy

## 2022-02-23 DIAGNOSIS — G35 Multiple sclerosis: Secondary | ICD-10-CM | POA: Diagnosis not present

## 2022-02-25 ENCOUNTER — Ambulatory Visit (HOSPITAL_COMMUNITY): Payer: Medicare Other | Admitting: Physical Therapy

## 2022-02-25 ENCOUNTER — Telehealth (HOSPITAL_COMMUNITY): Payer: Self-pay | Admitting: Physical Therapy

## 2022-02-25 NOTE — Telephone Encounter (Signed)
Pt did not show for appt today.  Called and spoke to patient who states she was unaware of an appt and did not get an reminder call.  Pt informed of next appt on Monday, which she wrote down the date and time.  Pt will need a new schedule when she arrives for next appt.   Lurena Nida, PTA/CLT [facility] Ph: [phone]

## 2022-03-01 ENCOUNTER — Encounter (HOSPITAL_COMMUNITY): Payer: Self-pay | Admitting: *Deleted

## 2022-03-01 ENCOUNTER — Other Ambulatory Visit: Payer: Self-pay

## 2022-03-01 ENCOUNTER — Emergency Department (HOSPITAL_COMMUNITY)
Admission: EM | Admit: 2022-03-01 | Discharge: 2022-03-01 | Disposition: A | Discharge status: Home / Self Care | Payer: Medicare Other | Attending: Emergency Medicine | Admitting: Emergency Medicine

## 2022-03-01 ENCOUNTER — Ambulatory Visit (HOSPITAL_COMMUNITY): Payer: Medicare Other | Admitting: Physical Therapy

## 2022-03-01 DIAGNOSIS — R52 Pain, unspecified: Secondary | ICD-10-CM | POA: Diagnosis not present

## 2022-03-01 DIAGNOSIS — R339 Retention of urine, unspecified: Secondary | ICD-10-CM | POA: Insufficient documentation

## 2022-03-01 DIAGNOSIS — R6889 Other general symptoms and signs: Secondary | ICD-10-CM | POA: Diagnosis not present

## 2022-03-01 DIAGNOSIS — Z743 Need for continuous supervision: Secondary | ICD-10-CM | POA: Diagnosis not present

## 2022-03-01 DIAGNOSIS — T83098A Other mechanical complication of other indwelling urethral catheter, initial encounter: Secondary | ICD-10-CM | POA: Insufficient documentation

## 2022-03-01 DIAGNOSIS — R5381 Other malaise: Secondary | ICD-10-CM | POA: Diagnosis not present

## 2022-03-01 DIAGNOSIS — T83010A Breakdown (mechanical) of cystostomy catheter, initial encounter: Secondary | ICD-10-CM

## 2022-03-01 MED ORDER — SULFAMETHOXAZOLE-TRIMETHOPRIM 800-160 MG PO TABS: 1.0000 | ORAL_TABLET | Freq: Two times a day (BID) | ORAL | 0 refills | Status: AC

## 2022-03-01 MED ORDER — SULFAMETHOXAZOLE-TRIMETHOPRIM 800-160 MG PO TABS
1.0000 | ORAL_TABLET | Freq: Once | ORAL | Status: AC
  Administered 2022-03-01: 1 via ORAL
  Filled 2022-03-01: qty 1

## 2022-03-01 NOTE — ED Notes (Signed)
Supra pubic cath irrigated with 60cc of sterile water, flushed with some difficulty noted, cloudy urine with sediment and flush returned. MD notified.

## 2022-03-01 NOTE — ED Provider Notes (Signed)
[facility] EMERGENCY DEPARTMENT Provider Note   [name]: [id number] Arrival date & time: 03/01/22  1303     History  Chief Complaint  Patient presents with   Urinary Retention    Pubic cath clogged     Melissa West is a 31 y.o. female.  With history of MS and chronic suprapubic catheter presents ED for evaluation of clogged suprapubic catheter.  She states it became clogged 1 to 2 days ago.  States this occurs often and she usually has to get the catheter replaced.  Also states that the urine that she has noticed in the tube is a little cloudier than it usually is.  Patient denies other symptoms including nausea, vomiting, abdominal pain, purulent drainage, back pain, pelvic pain  HPI     Home Medications Prior to Admission medications   Medication Sig Start Date End Date Taking? Authorizing Provider  sulfamethoxazole-trimethoprim (BACTRIM DS) 800-160 MG tablet Take 1 tablet by mouth 2 (two) times daily for 3 days. 03/01/22 03/04/22 Yes Claressa Hughley, Edsel Petrin, PA-C  acetaminophen (TYLENOL) 325 MG tablet Take 2 tablets (650 mg total) by mouth every 6 (six) hours as needed for mild pain (or Fever >/= 101). Patient not taking: Reported on 08/12/2021 05/20/21   Shon Hale, MD  acetic acid 0.25 % irrigation Irrigate with as directed daily. Irrigate 60 daily 02/03/22   [name], Mardene Celeste, MD  AMBULATORY NON FORMULARY MEDICATION 30 mLs by Intracatheter route daily as needed. Medication Name: Sodium Chloride 0.9% 11/12/21   [name], Mardene Celeste, MD  amoxicillin (AMOXIL) 500 MG capsule Take 1 capsule (500 mg total) by mouth 3 (three) times daily. Patient not taking: Reported on 12/15/2021 11/24/21   Elson Areas, PA-C  ergocalciferol (VITAMIN D2) 1.25 MG (50000 UT) capsule Take 1 capsule (50,000 Units total) by mouth once a week. 11/07/21   Emokpae, Courage, MD  KESIMPTA 20 MG/0.4ML SOAJ Inject 20 mg into the skin every 30 (thirty) days. 06/24/21   [provider]  midodrine  (PROAMATINE) 5 MG tablet Take 1 tablet (5 mg total) by mouth 3 (three) times daily with meals. To Help Prevent your Blood Pressure from dropping Patient not taking: Reported on 12/15/2021 11/07/21   Shon Hale, MD  nitrofurantoin, macrocrystal-monohydrate, (MACROBID) 100 MG capsule Take 1 capsule (100 mg total) by mouth 2 (two) times daily. Patient not taking: Reported on 01/28/2022 12/15/21   Sherian Maroon A, PA  polyvinyl alcohol (LIQUIFILM TEARS) 1.4 % ophthalmic solution Place 1 drop into both eyes as needed for dry eyes. Patient not taking: Reported on 06/26/2021 06/20/21   Shon Hale, MD  senna-docusate (SENOKOT-S) 8.6-50 MG tablet Take 2 tablets by mouth at bedtime. Patient not taking: Reported on 01/28/2022 11/07/21 11/07/22  Shon Hale, MD  Syringe, Disposable, (50-60CC SYRINGE) 60 ML MISC Flush 30-50cc of sterile water into catheter daily as needed 01/15/22   Ronne Binning Mardene Celeste, MD  Water For Irrigation, Sterile (STERILE WATER FOR IRRIGATION) Irrigate with 50 mLs as directed daily. Irrigate catheter daily with 50cc of sterile water. 01/15/22   [name], Mardene Celeste, MD      Allergies    Patient has no known allergies.    Review of Systems   Review of Systems  Genitourinary:  Positive for difficulty urinating.  All other systems reviewed and are negative.   Physical Exam Updated Vital Signs BP 108/77 (BP Location: Right Arm)   Pulse 84   Temp 98.7 F (37.1 C) (Oral)   Resp 18   LMP 01/25/2022 (  Approximate)   SpO2 100%  Physical Exam Vitals and nursing note reviewed.  Constitutional:      General: She is not in acute distress.    Appearance: Normal appearance. She is normal weight. She is not ill-appearing.  HENT:     Head: Normocephalic and atraumatic.  Cardiovascular:     Rate and Rhythm: Normal rate and regular rhythm.  Pulmonary:     Effort: Pulmonary effort is normal. No respiratory distress.  Abdominal:     General: Abdomen is flat.     Tenderness:  There is no abdominal tenderness. There is no right CVA tenderness, left CVA tenderness or guarding.  Musculoskeletal:        General: Normal range of motion.     Cervical back: Normal range of motion and neck supple.  Skin:    General: Skin is warm and dry.     Comments: Suprapubic catheter in place without purulent drainage or surrounding erythema.  There is a small amount of cloudy urine in the catheter bag.  Ostomy site is nontender  Neurological:     Mental Status: She is alert and oriented to person, place, and time.  Psychiatric:        Mood and Affect: Mood normal.        Behavior: Behavior normal.     ED Results / Procedures / Treatments   Labs (all labs ordered are listed, but only abnormal results are displayed) Labs Reviewed - No data to display  EKG None  Radiology No results found.  Procedures Procedures    Medications Ordered in ED Medications  sulfamethoxazole-trimethoprim (BACTRIM DS) 800-160 MG per tablet 1 tablet (has no administration in time range)    ED Course/ Medical Decision Making/ A&P Clinical Course as of 03/01/22 1652  Mon Mar 01, 2022  1538 Catheter was flushed with difficulty, urine did appear cloudy.  Will replace catheter at this time [AS]  1643 Suprapubic catheter successfully replaced by Loletta Specter, RN  [AS]    Clinical Course User Index [AS] Lula Olszewski Edsel Petrin, PA-C                           Medical Decision Making Risk Prescription drug management.  This patient presents to the ED for concern of clogged suprapubic catheter, this involves an extensive number of treatment options, and is a complaint that carries with it a high risk of complications and morbidity.  The differential diagnosis includes clogged suprapubic catheter, cystitis, cellulitis   Co morbidities that complicate the patient evaluation   MS  My initial workup includes replacing catheter  Additional history obtained from: Nursing notes from this  visit. Previous records within EMR system ED visit on 02/22/2022 for same  Afebrile, hemodynamically stable.  31 year old female presenting to the ED for evaluation of clogged suprapubic catheter.  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  Catheter does chronically get plugged.  Most recent visit was 7 days ago for same.  Patient has no other complaints at this time other than cloudy urine.  Patient is a chronic colonizer we will obtain urinalysis with culture to this.  We will start patient on Bactrim due to urine appearance.  Patient gets her prescriptions delivered, will give her the first dose today.  Gave patient strict return precautions.  Stable at discharge.  At this time there does not appear to be any evidence of an acute emergency medical condition and  the patient appears stable for discharge with appropriate outpatient follow up. Diagnosis was discussed with patient who verbalizes understanding of care plan and is agreeable to discharge. I have discussed return precautions with patient who verbalizes understanding. Patient encouraged to follow-up with their PCP within 1 week. All questions answered.  Patient's case discussed with Dr. Renaye Rakers who agrees with plan to discharge with follow-up.   Note: Portions of this report may have been transcribed using voice recognition software. Every effort was made to ensure accuracy; however, inadvertent computerized transcription errors may still be present.          Final Clinical Impression(s) / ED Diagnoses Final diagnoses:  Suprapubic catheter dysfunction, initial encounter [facility])    Rx / DC Orders ED Discharge Orders          Ordered    sulfamethoxazole-trimethoprim (BACTRIM DS) 800-160 MG tablet  2 times daily        03/01/22 1649              Michelle Piper, Cordelia Poche 03/01/22 1652    Terald Sleeper, MD 03/01/22 [phone]

## 2022-03-03 ENCOUNTER — Ambulatory Visit (HOSPITAL_COMMUNITY): Payer: Medicare Other | Admitting: Physical Therapy

## 2022-03-03 DIAGNOSIS — M6281 Muscle weakness (generalized): Secondary | ICD-10-CM

## 2022-03-03 DIAGNOSIS — R262 Difficulty in walking, not elsewhere classified: Secondary | ICD-10-CM

## 2022-03-03 DIAGNOSIS — G35 Multiple sclerosis: Secondary | ICD-10-CM | POA: Diagnosis not present

## 2022-03-03 NOTE — Therapy (Signed)
OUTPATIENT PHYSICAL THERAPY TREATMENT   Patient Name: Melissa West MRN: [id number] DOB:1990-06-01, 31 y.o., female Today's Date: 03/03/2022   PCP: Nita Sells, MD REFERRING PROVIDER: Shon Hale, MD    PT End of Session - 03/03/22 1344     Visit Number 3    Number of Visits 12    Date for PT Re-Evaluation 03/01/22    Authorization Type [employer] Medicare - Medicaid [place] secondary    Authorization Time Period "No auth, no VL"    Progress Note Due on Visit 10    PT Start Time 1300    PT Stop Time 1345    PT Time Calculation (min) 45 min    Activity Tolerance Other (comment)   Significant limitations in functional mobility   Behavior During Therapy WFL for tasks assessed/performed              Past Medical History:  Diagnosis Date   MS (multiple sclerosis) (HCC)    No pertinent past medical history    Past Surgical History:  Procedure Laterality Date   CESAREAN SECTION  08/09/2011   Procedure: CESAREAN SECTION;  Surgeon: Lesly Dukes, MD;  Location: [facility];  Service: Gynecology;  Laterality: N/A;   IR CATHETER TUBE CHANGE  07/22/2021   IR CATHETER TUBE CHANGE  09/10/2021   Patient Active Problem List   Diagnosis Date Noted   UTI (urinary tract infection) 11/04/2021   Osteomyelitis (HCC) 11/04/2021   Sepsis (HCC) 06/17/2021   Chronic anemia 05/20/2021   Abnormal finding of diagnostic imaging--Abnormal CT AP 05/19/2021   Bacteremia 05/17/2021    Class: Acute   Chest pain 05/16/2021   Hypotension 05/15/2021    Class: Acute   Sepsis secondary to UTI (HCC) 05/14/2021   Leukocytosis 03/08/2021   Acute on chronic urinary retention 03/08/2021   Pressure injury of skin 03/08/2021   AKI (acute kidney injury) (HCC) 03/07/2021   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   Ataxia 11/29/2018   Nystagmus 11/29/2018   MS (multiple sclerosis) (HCC)     ONSET DATE: MS diagnosed at 20. Significant decline in function approx 3 months ago  REFERRING DIAG: G35 (ICD-10-CM) - MS  (multiple sclerosis) (HCC)   THERAPY DIAG:  Muscle weakness (generalized)  Difficulty in walking, not elsewhere classified  Multiple sclerosis (HCC)  Rationale for Evaluation and Treatment Rehabilitation  SUBJECTIVE:                                                                                                                                                                                              SUBJECTIVE STATEMENT: Pt states she doesn't understand why she's so  stiff.  Reports she's been working on her UE exercises at home with the band.  Pt is still getting hoyer lifted at home. States the wheelchair she is in is not even hers.  PERTINENT HISTORY: MS, foley bag/cath  PAIN:  Are you having pain? No  PRECAUTIONS: Fall  WEIGHT BEARING RESTRICTIONS No  FALLS: Has patient fallen in last 6 months? No  LIVING ENVIRONMENT: Lives with: lives with their family and lives with their son Lives in: House/apartment Stairs: No Has following equipment at home: Environmental consultant - 2 wheeled, Wheelchair (manual), shower chair, and bed side commode  PLOF: Needs assistance with ADLs, Needs assistance with homemaking, Needs assistance with transfers, and Aide assists with all ADLs  PATIENT GOALS  Walk again, Transfer without a lift, sit on edge of bed.  OBJECTIVE:   DIAGNOSTIC FINDINGS:  IMPRESSION: Brain MRI:   1. Severe chronic demyelination without enhancing disease or clear progression. 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   Cervical MRI:   Confluent demyelination of the visible cord, subjectively progressed from 2020. No enhancing disease  COGNITION: Overall cognitive status: No family/caregiver present to determine baseline cognitive functioning  Slow processing, short term memory deficits Oriented to month, unsure of year Oriented to situation, and place SENSATION: Light touch: Impaired   COORDINATION: Grossly abnormal with LEs; spasticity  and tone FNF abnormal    MUSCLE TONE: LLE: Severe RLE Severe  MUSCLE LENGTH: In supine; Rt knee 74 degrees from full extension; Lt knee 78 deg from full extension Limited by tone and spasticity BIL    LOWER EXTREMITY ROM:     Limited assessment due to spasticity and tone; inability to maintain position.  LOWER EXTREMITY MMT:    MMT Right Eval Left Eval  Hip flexion    Hip extension    Hip abduction    Hip adduction    Hip internal rotation    Hip external rotation    Knee flexion    Knee extension    Ankle dorsiflexion    Ankle plantarflexion    Ankle inversion    Ankle eversion    (Blank rows = not tested)  BED MOBILITY:  Sit to supine Mod A Supine to sit Max A Rolling to Right Min A Rolling to Left Min A  TRANSFERS: Assistive device utilized: Wheelchair (manual)  Sit to stand: Total A Stand to sit:  Chair to chair: Total ATotal A     FUNCTIONAL TESTs:  See transfers abolve  PATIENT SURVEYS:   SF-36 Score Results Scale Score Result Physical functioning 5% Role limitations due to physical health 50% Role limitations due to emotional problems 100% Energy/fatigue 60% Emotional well-being 76% Social functioning 75% Pain 90% General health 25%   TODAY'S TREATMENT:  03/03/22 Attempted transfer, unable due to tone Seated:  PROM stretches for bil LE, PNF patterns, limited by  tone  Forward and lateral reaching 2X10   Scooting back into chair mod-max assist  Working on sitting upright 12/21/21 Seated lateral reaching 2x 10 bilateral Seated rotation with reaching 2 x 10 bilateral  Lateral leaning with min assist for balance 2x 10 bilateral  Row RTB 2x10 Shoulder extension RTB 2x 10 Overhead reach 2x 10 bilateral   Eval SF-36 Education Transfer and bed mobility Seated balance Relaxation techniques, supine stretch for LE extension HEP    PATIENT EDUCATION: Education details: 02/18/22: HEP pressure relief; Eval Findings, POC,  positioning, PT role Person educated: Patient Education method: Explanation Education comprehension: verbalized understanding  HOME EXERCISE PROGRAM: Access Code: [email or web address] URL: [email or web address] 02/18/22 - Seated Shoulder Row with Anchored Resistance  - 1 x daily - 7 x weekly - 2 sets - 10 reps - Seated Shoulder Extension and Scapular Retraction with Resistance  - 1 x daily - 7 x weekly - 2 sets - 10 reps  Date: 01/18/2022 - Seated Balance Activity: Lateral Reaching  - 3 x daily - 7 x weekly - 1 sets - 10 reps - Seated Balance Activity: Rotating at Trunk  - 3 x daily - 7 x weekly - 1 sets - 10 reps - Seated Balance Activity: Lateral Leans Elbow to Mat  - 1 x daily - 7 x weekly - 3 sets - 10 reps    GOALS: Goals reviewed with patient? Yes  SHORT TERM GOALS: Target date: 02/08/22  Patient will be independent with initial HEP and self-management strategies to improve functional outcomes Baseline: Initiated Goal status: IN PROGRESS    LONG TERM GOALS: Target date: 03/01/22  Patient will be independent with advanced HEP and self-management strategies to improve functional outcomes Baseline:  Goal status: IN PROGRESS  2.  Patient will Demonstrate ability to perform sit to sidelying/supine and roll at Mod I level to reduce burden of care and improve independence with mobility. Baseline: Max assist Goal status: IN PROGRESS  3.  Patient will demonstrate ability to actively extend knees in supine to 0 degrees bilaterally for maintenance and prevention of contractures. Baseline: Rt 74 from zero, Lt 78 from zero Goal status: IN PROGRESS  4. Patient will demonstrate improved seated balance for reduced burden of care from caregivers while bathing, by demonstrating ability to sit EOB x2 minutes with single UE support. Baseline: requires Min assist for seated balance. Goal status: IN PROGRESS  5. Patient will demonstrate ability to self propel wheel chair >50 feet  including 2 turns for greater independence with mobility.  Baseline: pt unable to push  ASSESSMENT:  CLINICAL IMPRESSION: Attempted transfer to bed, however pt with too much tone to complete safely.  Session completed seated in wheelchair today.  Focus on seated balance and postural control. Challenged reaching out of BOS sitting away from back of chair.  Scooting back into the chair with mod-max assist.  PROM for bil LE very challenging due to extreme tone.  Pt could only acquire 70 degrees towards extension.  Continued with resisted postural and UE strengthening with bands which is tolerated well. Patient LE mobility limited by spasticity and tone. Patient will benefit from wheelchair consult and will need supine/mat exercises when able to complete transfer.  Family training would be beneficial as well and discussed this with patient who has low family availability.      OBJECTIVE IMPAIRMENTS decreased activity tolerance, decreased balance, decreased cognition, decreased coordination, decreased endurance, decreased knowledge of condition, decreased knowledge of use of DME, decreased mobility, difficulty walking, decreased ROM, decreased strength, hypomobility, increased fascial restrictions, impaired perceived functional ability, increased muscle spasms, impaired flexibility, impaired sensation, impaired tone, impaired UE functional use, and postural dysfunction.   ACTIVITY LIMITATIONS carrying, lifting, bending, sitting, standing, squatting, stairs, transfers, bed mobility, continence, bathing, toileting, dressing, hygiene/grooming, and locomotion level  PARTICIPATION LIMITATIONS: meal prep, cleaning, laundry, medication management, and community activity  PERSONAL FACTORS Time since onset of injury/illness/exacerbation, Transportation, and 1 comorbidity: Multiple sclerosis  are also affecting patient's functional outcome.   REHAB POTENTIAL: Fair progressive disease  CLINICAL DECISION MAKING:  Unstable/unpredictable  EVALUATION COMPLEXITY: High  PLAN: PT FREQUENCY: 2x/week  PT DURATION:  6 weeks  PLANNED INTERVENTIONS: Therapeutic exercises, Therapeutic activity, Neuromuscular re-education, Balance training, Gait training, Patient/Family education, Self Care, Joint mobilization, Joint manipulation, Orthotic/Fit training, DME instructions, Wheelchair mobility training, Cryotherapy, Moist heat, Taping, Biofeedback, Manual therapy, and Re-evaluation  PLAN FOR NEXT SESSION: (Consider other subjective and functional tests as appropriate - limited day of eval to assess multiple deficits.) Seated balance - trunk control; Gentle ROM, HEP advancement; Bed mobility, weight shifting/unloading techniques;  Progress with transfer training as tolerated. Strengthen as range/tone improves. Consider modification to w/c to better stabilize LEs (frequently falling off foot rest). Consider power w/c. Patient will benefit from wheelchair consult and will need supine/mat exercises when able to complete transfer.  Family training would be beneficial as well.     1:45 PM, 03/03/22 Lurena Nida, PTA/CLT [facility] Ph: [phone]

## 2022-03-10 ENCOUNTER — Encounter (HOSPITAL_COMMUNITY): Payer: Medicare Other | Admitting: Physical Therapy

## 2022-03-10 ENCOUNTER — Telehealth: Payer: Self-pay

## 2022-03-10 NOTE — Telephone Encounter (Signed)
        Patient  visited [facility] on 11/20   Telephone encounter attempt :  2nd  A HIPAA compliant voice message was left requesting a return call.  Instructed patient to call back    Lenard Forth [facility] Guide, [facility], Care Management  [phone] [address] [place], [place], [place] [address] Phone: [phone] Email: [email or web address]

## 2022-03-11 ENCOUNTER — Telehealth: Payer: Self-pay

## 2022-03-11 NOTE — Telephone Encounter (Signed)
        Patient  visited [facility] on 02/22/2022  for Other mechanical complication of indwelling urethral catheter, initial encounter.   Telephone encounter attempt :  1st  Unable to leave message voicemail is full.   Jamey Harman [facility]  [facility] Population Health Community Resource Care Guide   [email or web address]  ?? [phone]   Website: [email or web address]  [email or web address]

## 2022-03-11 NOTE — Telephone Encounter (Signed)
     Patient  visit on 11/20  at [facility] Have you been able to follow up with your primary care physician? NO  The patient was or was not able to obtain any needed medicine or equipment. YES   Are there diet recommendations that you are having difficulty following? NA  Patient expresses understanding of discharge instructions and education provided has no other needs at this time. YES     Lenard Forth [facility] Guide, [facility], Care Management  [phone] [address] [place], [place], [place] [address] Phone: [phone] Email: [email or web address]

## 2022-03-12 ENCOUNTER — Telehealth: Payer: Self-pay

## 2022-03-12 NOTE — Telephone Encounter (Signed)
     Patient  visit on 02/22/2022  at [facility] was for Other mechanical complication of indwelling urethral catheter, initial encounter.  Have you been able to follow up with your primary care physician? No  The patient was or was not able to obtain any needed medicine or equipment. No medications prescribed.  Are there diet recommendations that you are having difficulty following? No  Patient expresses understanding of discharge instructions and education provided has no other needs at this time.    Melissa West [facility]  [facility] Population Health Community Resource Care Guide   [email or web address]  ?? [phone]   Website: [email or web address]  [email or web address]

## 2022-03-16 ENCOUNTER — Ambulatory Visit: Payer: Medicare Other

## 2022-03-17 ENCOUNTER — Encounter (HOSPITAL_COMMUNITY): Payer: Self-pay | Admitting: Emergency Medicine

## 2022-03-17 ENCOUNTER — Other Ambulatory Visit: Payer: Self-pay

## 2022-03-17 ENCOUNTER — Emergency Department (HOSPITAL_COMMUNITY)
Admission: EM | Admit: 2022-03-17 | Discharge: 2022-03-17 | Disposition: A | Discharge status: Home / Self Care | Payer: Medicare Other | Attending: Emergency Medicine | Admitting: Emergency Medicine

## 2022-03-17 DIAGNOSIS — N390 Urinary tract infection, site not specified: Secondary | ICD-10-CM | POA: Diagnosis not present

## 2022-03-17 DIAGNOSIS — R0689 Other abnormalities of breathing: Secondary | ICD-10-CM | POA: Diagnosis not present

## 2022-03-17 DIAGNOSIS — R69 Illness, unspecified: Secondary | ICD-10-CM | POA: Diagnosis not present

## 2022-03-17 DIAGNOSIS — Z743 Need for continuous supervision: Secondary | ICD-10-CM | POA: Diagnosis not present

## 2022-03-17 DIAGNOSIS — T839XXA Unspecified complication of genitourinary prosthetic device, implant and graft, initial encounter: Secondary | ICD-10-CM | POA: Diagnosis present

## 2022-03-17 DIAGNOSIS — T83510A Infection and inflammatory reaction due to cystostomy catheter, initial encounter: Secondary | ICD-10-CM | POA: Insufficient documentation

## 2022-03-17 DIAGNOSIS — T83091A Other mechanical complication of indwelling urethral catheter, initial encounter: Secondary | ICD-10-CM | POA: Diagnosis not present

## 2022-03-17 DIAGNOSIS — R6889 Other general symptoms and signs: Secondary | ICD-10-CM | POA: Diagnosis not present

## 2022-03-17 DIAGNOSIS — Z7401 Bed confinement status: Secondary | ICD-10-CM | POA: Diagnosis not present

## 2022-03-17 LAB — CBC WITH DIFFERENTIAL/PLATELET
Abs Immature Granulocytes: 0 10*3/uL (ref 0.00–0.07)
Basophils Absolute: 0 10*3/uL (ref 0.0–0.1)
Basophils Relative: 1 %
Eosinophils Absolute: 0.1 10*3/uL (ref 0.0–0.5)
Eosinophils Relative: 2 %
HCT: 37.7 % (ref 36.0–46.0)
Hemoglobin: 12.2 g/dL (ref 12.0–15.0)
Immature Granulocytes: 0 %
Lymphocytes Relative: 46 %
Lymphs Abs: 1.4 10*3/uL (ref 0.7–4.0)
MCH: 28 pg (ref 26.0–34.0)
MCHC: 32.4 g/dL (ref 30.0–36.0)
MCV: 86.7 fL (ref 80.0–100.0)
Monocytes Absolute: 0.2 10*3/uL (ref 0.1–1.0)
Monocytes Relative: 5 %
Neutro Abs: 1.5 10*3/uL — ABNORMAL LOW (ref 1.7–7.7)
Neutrophils Relative %: 46 %
Platelets: 182 10*3/uL (ref 150–400)
RBC: 4.35 MIL/uL (ref 3.87–5.11)
RDW: 15.2 % (ref 11.5–15.5)
WBC: 3.1 10*3/uL — ABNORMAL LOW (ref 4.0–10.5)
nRBC: 0 % (ref 0.0–0.2)

## 2022-03-17 LAB — BASIC METABOLIC PANEL
Anion gap: 6 (ref 5–15)
BUN: 13 mg/dL (ref 6–20)
CO2: 24 mmol/L (ref 22–32)
Calcium: 8.7 mg/dL — ABNORMAL LOW (ref 8.9–10.3)
Chloride: 107 mmol/L (ref 98–111)
Creatinine, Ser: 0.66 mg/dL (ref 0.44–1.00)
GFR, Estimated: >60 mL/min (ref >60)
Glucose, Bld: 86 mg/dL (ref 70–99)
Potassium: 3.9 mmol/L (ref 3.5–5.1)
Sodium: 137 mmol/L (ref 135–145)

## 2022-03-17 LAB — URINALYSIS, ROUTINE W REFLEX MICROSCOPIC
Bilirubin Urine: NEGATIVE
Glucose, UA: NEGATIVE mg/dL
Ketones, ur: NEGATIVE mg/dL
Nitrite: NEGATIVE
Protein, ur: 100 mg/dL — AB
Specific Gravity, Urine: 1.013 (ref 1.005–1.030)
WBC, UA: >50 WBC/hpf — ABNORMAL HIGH (ref 0–5)
pH: 7 (ref 5.0–8.0)

## 2022-03-17 LAB — LACTIC ACID, PLASMA: Lactic Acid, Venous: 0.7 mmol/L (ref 0.5–1.9)

## 2022-03-17 MED ORDER — SODIUM CHLORIDE 0.9 % IV BOLUS: 1000.0000 mL | Freq: Once | INTRAVENOUS | Status: DC

## 2022-03-17 MED ORDER — LEVOFLOXACIN 500 MG PO TABS: 500.0000 mg | ORAL_TABLET | Freq: Every day | ORAL | 0 refills | Status: DC

## 2022-03-17 MED ORDER — LEVOFLOXACIN 500 MG PO TABS: 500.0000 mg | ORAL_TABLET | Freq: Once | ORAL | Status: DC

## 2022-03-17 NOTE — Discharge Instructions (Addendum)
You do not clearly have a urinary infection based on today's urine test, however a urine culture has been ordered, if this culture suggests you need to be on an antibiotic we will call this in for you.

## 2022-03-17 NOTE — ED Triage Notes (Signed)
Pt c/o clogged catheter. Pt went to urologist but was told to come to the ED. Urine has been leaking but no urine in bag X 2 days

## 2022-03-17 NOTE — ED Notes (Signed)
[facility] scheduler called to setup transport at this time.

## 2022-03-17 NOTE — ED Provider Notes (Addendum)
[facility] EMERGENCY DEPARTMENT Provider Note   [name]: [id number] Arrival date & time: 03/17/22  4315     History  Chief Complaint  Patient presents with   Urinary Retention    Catheter is colgged    Melissa West is a 31 y.o. female with a history significant for MS requiring wheelchair dependence, has a chronic indwelling suprapubic Foley catheter which once again is not draining.  She states has been several days since she has seen any urine in the bag but is having leakage from around the tube as she wakes up every morning with completely wet bedsheets.  She has had no fevers or chills, denies any other complaints, stating she is feeling well.  She was last seen for this on November 20 at which time her urine was very cloudy, urine culture was sent and upon review she did have an E. coli infection.  At that time she was treated with Bactrim, she completed the course and per the culture, Bactrim was effective.  The history is provided by the patient.       Home Medications Prior to Admission medications   Medication Sig Start Date End Date Taking? Authorizing Provider  acetic acid 0.25 % irrigation Irrigate with as directed daily. Irrigate 60 daily 02/03/22  Yes [name], Mardene Celeste, MD  AMBULATORY NON FORMULARY MEDICATION 30 mLs by Intracatheter route daily as needed. Medication Name: Sodium Chloride 0.9% 11/12/21  Yes [name], Mardene Celeste, MD  ergocalciferol (VITAMIN D2) 1.25 MG (50000 UT) capsule Take 1 capsule (50,000 Units total) by mouth once a week. 11/07/21  Yes Emokpae, Courage, MD  KESIMPTA 20 MG/0.4ML SOAJ Inject 20 mg into the skin every 30 (thirty) days. 06/24/21  Yes [provider]  levofloxacin (LEVAQUIN) 500 MG tablet Take 1 tablet (500 mg total) by mouth daily. 03/17/22  Yes Garey Alleva, Raynelle Fanning, PA-C  Syringe, Disposable, (50-60CC SYRINGE) 60 ML MISC Flush 30-50cc of sterile water into catheter daily as needed 01/15/22  Yes [name], Mardene Celeste, MD  acetaminophen  (TYLENOL) 325 MG tablet Take 2 tablets (650 mg total) by mouth every 6 (six) hours as needed for mild pain (or Fever >/= 101). Patient not taking: Reported on 08/12/2021 05/20/21   Shon Hale, MD  midodrine (PROAMATINE) 5 MG tablet Take 1 tablet (5 mg total) by mouth 3 (three) times daily with meals. To Help Prevent your Blood Pressure from dropping Patient not taking: Reported on 12/15/2021 11/07/21   Shon Hale, MD  nitrofurantoin, macrocrystal-monohydrate, (MACROBID) 100 MG capsule Take 1 capsule (100 mg total) by mouth 2 (two) times daily. Patient not taking: Reported on 01/28/2022 12/15/21   Sherian Maroon A, PA  polyvinyl alcohol (LIQUIFILM TEARS) 1.4 % ophthalmic solution Place 1 drop into both eyes as needed for dry eyes. Patient not taking: Reported on 06/26/2021 06/20/21   Shon Hale, MD  senna-docusate (SENOKOT-S) 8.6-50 MG tablet Take 2 tablets by mouth at bedtime. Patient not taking: Reported on 01/28/2022 11/07/21 11/07/22  Shon Hale, MD  Water For Irrigation, Sterile (STERILE WATER FOR IRRIGATION) Irrigate with 50 mLs as directed daily. Irrigate catheter daily with 50cc of sterile water. Patient not taking: Reported on 03/17/2022 01/15/22   Malen Gauze, MD      Allergies    Patient has no known allergies.    Review of Systems   Review of Systems  Constitutional:  Negative for chills and fever.  Eyes: Negative.   Respiratory:  Negative for chest tightness and shortness of breath.  Cardiovascular:  Negative for chest pain.  Gastrointestinal:  Negative for abdominal pain, nausea and vomiting.  Genitourinary:  Positive for difficulty urinating.  Musculoskeletal:  Negative for arthralgias.  Skin: Negative.  Negative for rash and wound.  Neurological:  Negative for headaches.  Psychiatric/Behavioral: Negative.    All other systems reviewed and are negative.   Physical Exam Updated Vital Signs BP 93/62   Pulse 91   Temp 98.2 F (36.8 C) (Oral)   Resp  18   Ht 5\' 3"  (1.6 m)   Wt 58 kg   LMP 01/25/2022 (Approximate)   SpO2 99%   BMI 22.65 kg/m  Physical Exam Vitals and nursing note reviewed. Exam conducted with a chaperone present.  Constitutional:      Appearance: She is well-developed.  HENT:     Head: Normocephalic and atraumatic.  Eyes:     Conjunctiva/sclera: Conjunctivae normal.  Cardiovascular:     Rate and Rhythm: Normal rate and regular rhythm.     Heart sounds: Normal heart sounds.  Pulmonary:     Effort: Pulmonary effort is normal.     Breath sounds: Normal breath sounds. No wheezing.  Abdominal:     General: Bowel sounds are normal.     Palpations: Abdomen is soft.     Tenderness: There is no abdominal tenderness.  Genitourinary:    Comments: Suprapubic catheter in place,  small amount of exudate around the tubing, no surrounding erythema.  No urine in tubing or bag. Musculoskeletal:        General: Normal range of motion.     Cervical back: Normal range of motion.  Skin:    General: Skin is warm and dry.  Neurological:     Mental Status: She is alert.     ED Results / Procedures / Treatments   Labs (all labs ordered are listed, but only abnormal results are displayed) Labs Reviewed  CBC WITH DIFFERENTIAL/PLATELET - Abnormal; Notable for the following components:      Result Value   WBC 3.1 (*)    Neutro Abs 1.5 (*)    All other components within normal limits  BASIC METABOLIC PANEL - Abnormal; Notable for the following components:   Calcium 8.7 (*)    All other components within normal limits  URINALYSIS, ROUTINE W REFLEX MICROSCOPIC - Abnormal; Notable for the following components:   APPearance HAZY (*)    Hgb urine dipstick SMALL (*)    Protein, ur 100 (*)    Leukocytes,Ua LARGE (*)    WBC, UA >50 (*)    Bacteria, UA RARE (*)    All other components within normal limits  CULTURE, BLOOD (ROUTINE X 2)  CULTURE, BLOOD (ROUTINE X 2)  URINE CULTURE  LACTIC ACID, PLASMA     EKG None  Radiology No results found.  Procedures SUPRAPUBIC TUBE PLACEMENT  Date/Time: 03/17/2022 4:00 PM  Performed by: 14/09/2021, PA-C Authorized by: Burgess Amor, PA-C   Consent:    Consent obtained:  Verbal   Risks, benefits, and alternatives were discussed: yes     Risks discussed:  Bleeding, bowel perforation, infection and pain   Alternatives discussed:  No treatment Universal protocol:    Patient identity confirmed:  Verbally with patient Sedation:    Sedation type:  None Anesthesia:    Anesthesia method:  None Procedure details:    Complexity:  Simple   Catheter type:  Foley   Catheter size:  16 Fr   Ultrasound guidance: no  Number of attempts:  1   Urine characteristics:  Mildly cloudy and yellow Post-procedure details:    Procedure completion:  Tolerated well, no immediate complications     Medications Ordered in ED Medications  sodium chloride 0.9 % bolus 1,000 mL (1,000 mLs Intravenous Patient Refused/Not Given 03/17/22 1100)  levofloxacin (LEVAQUIN) tablet 500 mg (has no administration in time range)    ED Course/ Medical Decision Making/ A&P                           Medical Decision Making Patient presenting with a blocked suprapubic Foley catheter with she is a frequent complication for this patient.  There has been no urine collecting in the bag for the past several days instead she has been soiling her bedding.  She has had no fevers, has no other physical complaints.  She is bed and wheelchair bound secondary to advanced MS.  She was seen for similar symptoms last month at which time she had her catheter replaced, her urine was very cloudy at that time, urine culture collected then grew out greater than 100,000 E. coli, she had been placed on a course of Bactrim which per the culture should have been an adequate choice.  However her urine remains very cloudy today as well.  This was a new specimen collected after the catheter was changed.   Culture was again ordered she does have a large number of leukocytes in her urine and RBCs, however rare bacteria.  Urine culture pending at this time.  We will hold on antibiotics pending culture results.     Amount and/or Complexity of Data Reviewed Labs: ordered.  Risk Prescription drug management.           Final Clinical Impression(s) / ED Diagnoses Final diagnoses:  Problem with Foley catheter, initial encounter (HCC)  Urinary tract infection associated with cystostomy catheter, initial encounter [facility])    Rx / DC Orders ED Discharge Orders          Ordered    levofloxacin (LEVAQUIN) 500 MG tablet  Daily        03/17/22 1557              Burgess Amor, PA-C 03/17/22 1606    Burgess Amor, PA-C 03/17/22 1651    Gloris Manchester, MD 03/20/22 1108

## 2022-03-19 LAB — URINE CULTURE
Culture: >=100000 — AB
Special Requests: NORMAL

## 2022-03-20 ENCOUNTER — Telehealth (HOSPITAL_BASED_OUTPATIENT_CLINIC_OR_DEPARTMENT_OTHER): Payer: Self-pay | Admitting: *Deleted

## 2022-03-20 NOTE — Telephone Encounter (Signed)
Post ED Visit - Positive Culture Follow-up  Culture report reviewed by antimicrobial stewardship pharmacist: [facility] []  , Pharm.D. []  Enzo Bi, Pharm.D., BCPS AQ-ID []  , Pharm.D., BCPS []  Celedonio Miyamoto, Pharm.D., BCPS []  [place], Garvin Fila.D., BCPS, AAHIVP []  , Pharm.D., BCPS, AAHIVP []  Georgina Pillion, PharmD, BCPS []  , PharmD, BCPS []  [place], PharmD, BCPS []  [address], PharmD []  , PharmD, BCPS [x]  Estella Husk, PharmD  Pharmacy Team []  Lysle Pearl, PharmD []  , PharmD []  Phillips Climes, PharmD []  , Rph []  Agapito Games) , PharmD []  Verlan Friends, PharmD []  , PharmD []  Mervyn Gay, PharmD []  , PharmD []  Eldridge Scot, PharmD []  [facility], PharmD []  , PharmD []  Len Childs, PharmD   Positive urine culture Treated with Levofloxacin, organism sensitive to the same and no further patient follow-up is required at this time.  03/20/2022, 11:38 AM

## 2022-03-22 DIAGNOSIS — G35 Multiple sclerosis: Secondary | ICD-10-CM | POA: Diagnosis not present

## 2022-03-22 LAB — CULTURE, BLOOD (ROUTINE X 2)
Culture: NO GROWTH
Culture: NO GROWTH

## 2022-03-24 ENCOUNTER — Ambulatory Visit (HOSPITAL_COMMUNITY): Payer: Medicare Other | Attending: Family Medicine | Admitting: Physical Therapy

## 2022-03-24 ENCOUNTER — Encounter (HOSPITAL_COMMUNITY): Payer: Self-pay | Admitting: Physical Therapy

## 2022-03-24 DIAGNOSIS — R262 Difficulty in walking, not elsewhere classified: Secondary | ICD-10-CM | POA: Diagnosis not present

## 2022-03-24 DIAGNOSIS — G35 Multiple sclerosis: Secondary | ICD-10-CM

## 2022-03-24 DIAGNOSIS — M6281 Muscle weakness (generalized): Secondary | ICD-10-CM

## 2022-03-24 NOTE — Therapy (Signed)
OUTPATIENT PHYSICAL THERAPY TREATMENT   Patient Name: Melissa West MRN: [id number] DOB:30-Jan-1991, 31 y.o., female Today's Date: 03/24/2022  PHYSICAL THERAPY DISCHARGE SUMMARY  Visits from Start of Care: 4  Current functional level related to goals / functional outcomes: See below   Remaining deficits: See below   Education / Equipment: See below   Patient agrees to discharge. Patient goals were not met. Patient is being discharged due to lack of progress.   PCP: Allyn Kenner, MD REFERRING PROVIDER: Roxan Hockey, MD    PT End of Session - 03/24/22 1035     Visit Number 4    Number of Visits 12    Date for PT Re-Evaluation 03/01/22    Authorization Type UHC Medicare - Medicaid [facility] secondary    Authorization Time Period "No auth, no VL"    Progress Note Due on Visit 10    PT Start Time 1035    PT Stop Time 1107    PT Time Calculation (min) 32 min    Activity Tolerance Other (comment)   Significant limitations in functional mobility   Behavior During Therapy WFL for tasks assessed/performed              Past Medical History:  Diagnosis Date   MS (multiple sclerosis) ([place])    No pertinent past medical history    Past Surgical History:  Procedure Laterality Date   CESAREAN SECTION  08/09/2011   Procedure: CESAREAN SECTION;  Surgeon: Guss Bunde, MD;  Location: [facility];  Service: Gynecology;  Laterality: N/A;   IR CATHETER TUBE CHANGE  07/22/2021   IR CATHETER TUBE CHANGE  09/10/2021   Patient Active Problem List   Diagnosis Date Noted   UTI (urinary tract infection) 11/04/2021   Osteomyelitis ([place]) 11/04/2021   Sepsis ([place]) 06/17/2021   Chronic anemia 05/20/2021   Abnormal finding of diagnostic imaging--Abnormal CT AP 05/19/2021   Bacteremia 05/17/2021    Class: Acute   Chest pain 05/16/2021   Hypotension 05/15/2021    Class: Acute   Sepsis secondary to UTI ([place]) 05/14/2021   Leukocytosis 03/08/2021   Acute on chronic urinary retention  03/08/2021   Pressure injury of skin 03/08/2021   AKI (acute kidney injury) ([place]) 03/07/2021   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   Ataxia 11/29/2018   Nystagmus 11/29/2018   MS (multiple sclerosis) ([place])     ONSET DATE: MS diagnosed at 39. Significant decline in function approx 3 months ago  REFERRING DIAG: G35 (ICD-10-CM) - MS (multiple sclerosis) ([place])   THERAPY DIAG:  Muscle weakness (generalized)  Difficulty in walking, not elsewhere classified  Multiple sclerosis ([place])  Rationale for Evaluation and Treatment Rehabilitation  SUBJECTIVE:  SUBJECTIVE STATEMENT: Pt states she doesn't feel like therapy as been helpful. Has been able to work on home exercises. Still having a lot of trouble with mobility.  PERTINENT HISTORY: MS, foley bag/cath  PAIN:  Are you having pain? No  PRECAUTIONS: Fall  WEIGHT BEARING RESTRICTIONS No  FALLS: Has patient fallen in last 6 months? No  LIVING ENVIRONMENT: Lives with: lives with their family and lives with their son Lives in: House/apartment Stairs: No Has following equipment at home: Environmental consultant - 2 wheeled, Wheelchair (manual), shower chair, and bed side commode  PLOF: Needs assistance with ADLs, Needs assistance with homemaking, Needs assistance with transfers, and Aide assists with all ADLs  PATIENT GOALS  Walk again, Transfer without a lift, sit on edge of bed.  OBJECTIVE:   DIAGNOSTIC FINDINGS:  IMPRESSION: Brain MRI:   1. Severe chronic demyelination without enhancing disease or clear progression. 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   Cervical MRI:   Confluent demyelination of the visible cord, subjectively progressed from 2020. No enhancing disease  COGNITION: Overall cognitive status: No  family/caregiver present to determine baseline cognitive functioning  Slow processing, short term memory deficits Oriented to month, unsure of year Oriented to situation, and place SENSATION: Light touch: Impaired   COORDINATION: Grossly abnormal with LEs; spasticity and tone FNF abnormal    MUSCLE TONE: LLE: Severe RLE Severe  MUSCLE LENGTH: In supine; Rt knee 74 degrees from full extension; Lt knee 78 deg from full extension Limited by tone and spasticity BIL    LOWER EXTREMITY ROM:     Limited assessment due to spasticity and tone; inability to maintain position.  LOWER EXTREMITY MMT:    MMT Right Eval Left Eval  Hip flexion    Hip extension    Hip abduction    Hip adduction    Hip internal rotation    Hip external rotation    Knee flexion    Knee extension    Ankle dorsiflexion    Ankle plantarflexion    Ankle inversion    Ankle eversion    (Blank rows = not tested)  BED MOBILITY:  Sit to supine Mod A Supine to sit Max A Rolling to Right Min A Rolling to Left Min A  TRANSFERS: Assistive device utilized: Wheelchair (manual)  Sit to stand: Total A Stand to sit:  Chair to chair: Total ATotal A     FUNCTIONAL TESTs:  See transfers abolve  PATIENT SURVEYS:   SF-36 Score Results Scale Score Result Physical functioning 5% Role limitations due to physical health 50% Role limitations due to emotional problems 100% Energy/fatigue 60% Emotional well-being 76% Social functioning 75% Pain 90% General health 25%   TODAY'S TREATMENT:  03/24/22 Reassessment ROM, MMT limited by spasticity and tone, inability to maintain position. Transfer attempt with total assist x 2 unable due to safety and significant mobility impairment LE positioning on leg rests of WC - total assist several times during session Education on benefits of HHPT and aides for more frequent mobility  03/03/22 Attempted transfer, unable due to tone Seated:  PROM stretches for  bil LE, PNF patterns, limited by  tone  Forward and lateral reaching 2X10   Scooting back into chair mod-max assist  Working on sitting upright 12/21/21 Seated lateral reaching 2x 10 bilateral Seated rotation with reaching 2 x 10 bilateral  Lateral leaning with min assist for balance 2x 10 bilateral  Row RTB 2x10 Shoulder extension RTB 2x 10 Overhead reach 2x 10 bilateral  Eval SF-36 Education Transfer and bed mobility Seated balance Relaxation techniques, supine stretch for LE extension HEP    PATIENT EDUCATION: Education details: 02/18/22: HEP pressure relief; Eval Findings, POC, positioning, PT role Person educated: Patient Education method: Explanation Education comprehension: verbalized understanding   HOME EXERCISE PROGRAM: Access Code: [email or web address] URL: [email or web address] 02/18/22 - Seated Shoulder Row with Anchored Resistance  - 1 x daily - 7 x weekly - 2 sets - 10 reps - Seated Shoulder Extension and Scapular Retraction with Resistance  - 1 x daily - 7 x weekly - 2 sets - 10 reps  Date: 01/18/2022 - Seated Balance Activity: Lateral Reaching  - 3 x daily - 7 x weekly - 1 sets - 10 reps - Seated Balance Activity: Rotating at Trunk  - 3 x daily - 7 x weekly - 1 sets - 10 reps - Seated Balance Activity: Lateral Leans Elbow to Mat  - 1 x daily - 7 x weekly - 3 sets - 10 reps    GOALS: Goals reviewed with patient? Yes  SHORT TERM GOALS: Target date: 02/08/22  Patient will be independent with initial HEP and self-management strategies to improve functional outcomes Baseline: Initiated 12/13 states completing HEP Goal status: MET    LONG TERM GOALS: Target date: 03/01/22  Patient will be independent with advanced HEP and self-management strategies to improve functional outcomes Baseline:  Goal status: NOT MET  2.  Patient will Demonstrate ability to perform sit to sidelying/supine and roll at Mod I level to reduce burden of care and improve  independence with mobility. Baseline: Max assist Goal status: NOT MET  3.  Patient will demonstrate ability to actively extend knees in supine to 0 degrees bilaterally for maintenance and prevention of contractures. Baseline: Rt 74 from zero, Lt 78 from zero Goal status: NOT MET  4. Patient will demonstrate improved seated balance for reduced burden of care from caregivers while bathing, by demonstrating ability to sit EOB x2 minutes with single UE support. Baseline: requires Min assist for seated balance. Goal status: NOT MET  5. Patient will demonstrate ability to self propel wheel chair >50 feet including 2 turns for greater independence with mobility.  Baseline:NOT MET  ASSESSMENT:  CLINICAL IMPRESSION: Patient has met 1/1 short term goal reporting compliance with HEP. Patient has met 0/5 long term goals with severe deficits in mobility, strength, ROM, tone, spasticity. Patient with very limited attendance completed during prior certification period and her certification actually ended over 3 weeks ago with 4 visits attended from 01/18/22 to 03/24/22. Lack of progress likely due to poor attendance, significantly limited mobility with progressive disease, and limited caregiver assist at home. Discussed with patient that outpatient PT may not be the best option for her rehab due to inconsistent attendance as she has had several cancellations/no shows due to transportation or other issues limiting attendance and educated her that she would likely benefit from home health PT as she may be able to have more consistent care. Patient stating she wanted to attend outpatient PT to use the machines and that she would go elsewhere for PT. Educated patient that her level of mobility is not appropriate for machines at this time and that therapists tried to provide the best care possible with her current mobility and limitations. Attempted to transfer patient to bed from [facility] to assess bed mobility but unable  with max/total assist x 2 due to spasticity/tone. Patient would possibly benefit from home health PT and possibly an aid for more  consistant mobility and caregiver education. Patient discharged from PT at this time.     OBJECTIVE IMPAIRMENTS decreased activity tolerance, decreased balance, decreased cognition, decreased coordination, decreased endurance, decreased knowledge of condition, decreased knowledge of use of DME, decreased mobility, difficulty walking, decreased ROM, decreased strength, hypomobility, increased fascial restrictions, impaired perceived functional ability, increased muscle spasms, impaired flexibility, impaired sensation, impaired tone, impaired UE functional use, and postural dysfunction.   ACTIVITY LIMITATIONS carrying, lifting, bending, sitting, standing, squatting, stairs, transfers, bed mobility, continence, bathing, toileting, dressing, hygiene/grooming, and locomotion level  PARTICIPATION LIMITATIONS: meal prep, cleaning, laundry, medication management, and community activity  PERSONAL FACTORS Time since onset of injury/illness/exacerbation, Transportation, and 1 comorbidity: Multiple sclerosis  are also affecting patient's functional outcome.   REHAB POTENTIAL: Fair progressive disease  CLINICAL DECISION MAKING: Unstable/unpredictable  EVALUATION COMPLEXITY: High  PLAN: PT FREQUENCY: 2x/week  PT DURATION: 6 weeks  PLANNED INTERVENTIONS: Therapeutic exercises, Therapeutic activity, Neuromuscular re-education, Balance training, Gait training, Patient/Family education, Self Care, Joint mobilization, Joint manipulation, Orthotic/Fit training, DME instructions, Wheelchair mobility training, Cryotherapy, Moist heat, Taping, Biofeedback, Manual therapy, and Re-evaluation  PLAN FOR NEXT SESSION: n/a   12:17 PM, 03/24/22 Mearl Latin PT, DPT Physical Therapist at [facility]

## 2022-03-25 DIAGNOSIS — G35 Multiple sclerosis: Secondary | ICD-10-CM | POA: Diagnosis not present

## 2022-03-29 ENCOUNTER — Telehealth: Payer: Self-pay

## 2022-03-29 NOTE — Telephone Encounter (Signed)
     Patient  visit on 12/6  at [facility]   Have you been able to follow up with your primary care physician? NO  The patient was or  able to obtain any needed medicine or equipment. YES   Are there diet recommendations that you are having difficulty following? NA   Patient expresses understanding of discharge instructions and education provided has no other needs at this time.  YES     [facility] Guide, [facility], Care Management  [phone] [address] [place], [place], [place] [address] Phone: [phone] Email: [email or web address]

## 2022-03-31 ENCOUNTER — Emergency Department (HOSPITAL_COMMUNITY)
Admission: EM | Admit: 2022-03-31 | Discharge: 2022-03-31 | Disposition: A | Discharge status: Home / Self Care | Payer: Medicare Other | Attending: Emergency Medicine | Admitting: Emergency Medicine

## 2022-03-31 ENCOUNTER — Other Ambulatory Visit: Payer: Self-pay

## 2022-03-31 DIAGNOSIS — Z743 Need for continuous supervision: Secondary | ICD-10-CM | POA: Diagnosis not present

## 2022-03-31 DIAGNOSIS — R279 Unspecified lack of coordination: Secondary | ICD-10-CM | POA: Diagnosis not present

## 2022-03-31 DIAGNOSIS — R5381 Other malaise: Secondary | ICD-10-CM | POA: Diagnosis not present

## 2022-03-31 DIAGNOSIS — I499 Cardiac arrhythmia, unspecified: Secondary | ICD-10-CM | POA: Diagnosis not present

## 2022-03-31 DIAGNOSIS — T83010A Breakdown (mechanical) of cystostomy catheter, initial encounter: Secondary | ICD-10-CM

## 2022-03-31 DIAGNOSIS — R0902 Hypoxemia: Secondary | ICD-10-CM | POA: Diagnosis not present

## 2022-03-31 DIAGNOSIS — T83098A Other mechanical complication of other indwelling urethral catheter, initial encounter: Secondary | ICD-10-CM | POA: Diagnosis not present

## 2022-03-31 DIAGNOSIS — Y69 Unspecified misadventure during surgical and medical care: Secondary | ICD-10-CM | POA: Insufficient documentation

## 2022-03-31 DIAGNOSIS — R109 Unspecified abdominal pain: Secondary | ICD-10-CM | POA: Diagnosis not present

## 2022-03-31 DIAGNOSIS — T83090A Other mechanical complication of cystostomy catheter, initial encounter: Secondary | ICD-10-CM | POA: Diagnosis not present

## 2022-03-31 LAB — URINALYSIS, ROUTINE W REFLEX MICROSCOPIC
Bacteria, UA: NONE SEEN
Bilirubin Urine: NEGATIVE
Glucose, UA: NEGATIVE mg/dL
Ketones, ur: NEGATIVE mg/dL
Nitrite: NEGATIVE
Protein, ur: >=300 mg/dL — AB
RBC / HPF: >50 RBC/hpf — ABNORMAL HIGH (ref 0–5)
Specific Gravity, Urine: 1.016 (ref 1.005–1.030)
WBC, UA: >50 WBC/hpf — ABNORMAL HIGH (ref 0–5)
pH: 7 (ref 5.0–8.0)

## 2022-03-31 NOTE — ED Provider Notes (Signed)
[facility] EMERGENCY DEPARTMENT Provider Note   [name]: [id number] Arrival date & time: 03/31/22  1342     History  Chief Complaint  Patient presents with   catheter clogged    Melissa West is a 31 y.o. female with history of multiple sclerosis, wheelchair dependence, indwelling suprapubic catheter who presents emergency department complaining of issues with her catheter.  She states that it has been clogged since yesterday.  Denies any pain, fever, nausea or vomiting.  HPI     Home Medications Prior to Admission medications   Medication Sig Start Date End Date Taking? Authorizing Provider  levofloxacin (LEVAQUIN) 500 MG tablet Take 500 mg by mouth daily. 03/17/22  Yes [provider]  acetaminophen (TYLENOL) 325 MG tablet Take 2 tablets (650 mg total) by mouth every 6 (six) hours as needed for mild pain (or Fever >/= 101). Patient not taking: Reported on 08/12/2021 05/20/21   Shon Hale, MD  acetic acid 0.25 % irrigation Irrigate with as directed daily. Irrigate 60 daily 02/03/22   [name], Mardene Celeste, MD  AMBULATORY NON FORMULARY MEDICATION 30 mLs by Intracatheter route daily as needed. Medication Name: Sodium Chloride 0.9% 11/12/21   [name], Mardene Celeste, MD  ergocalciferol (VITAMIN D2) 1.25 MG (50000 UT) capsule Take 1 capsule (50,000 Units total) by mouth once a week. 11/07/21   Emokpae, Courage, MD  KESIMPTA 20 MG/0.4ML SOAJ Inject 20 mg into the skin every 30 (thirty) days. 06/24/21   [provider]  midodrine (PROAMATINE) 5 MG tablet Take 1 tablet (5 mg total) by mouth 3 (three) times daily with meals. To Help Prevent your Blood Pressure from dropping Patient not taking: Reported on 12/15/2021 11/07/21   Shon Hale, MD  nitrofurantoin, macrocrystal-monohydrate, (MACROBID) 100 MG capsule Take 1 capsule (100 mg total) by mouth 2 (two) times daily. Patient not taking: Reported on 01/28/2022 12/15/21   Sherian Maroon A, PA  polyvinyl alcohol (LIQUIFILM  TEARS) 1.4 % ophthalmic solution Place 1 drop into both eyes as needed for dry eyes. Patient not taking: Reported on 06/26/2021 06/20/21   Shon Hale, MD  senna-docusate (SENOKOT-S) 8.6-50 MG tablet Take 2 tablets by mouth at bedtime. Patient not taking: Reported on 01/28/2022 11/07/21 11/07/22  Shon Hale, MD  Syringe, Disposable, (50-60CC SYRINGE) 60 ML MISC Flush 30-50cc of sterile water into catheter daily as needed 01/15/22   Ronne Binning Mardene Celeste, MD  Water For Irrigation, Sterile (STERILE WATER FOR IRRIGATION) Irrigate with 50 mLs as directed daily. Irrigate catheter daily with 50cc of sterile water. Patient not taking: Reported on 03/17/2022 01/15/22   Malen Gauze, MD      Allergies    Patient has no known allergies.    Review of Systems   Review of Systems  Genitourinary:  Positive for difficulty urinating.  All other systems reviewed and are negative.   Physical Exam Updated Vital Signs BP 105/78   Pulse (!) 56   Temp 97.7 F (36.5 C) (Oral)   Resp 16   Ht 5\' 3"  (1.6 m)   Wt 58 kg   SpO2 100%   BMI 22.65 kg/m  Physical Exam Vitals and nursing note reviewed.  Constitutional:      Appearance: Normal appearance.  HENT:     Head: Normocephalic and atraumatic.  Eyes:     Conjunctiva/sclera: Conjunctivae normal.  Pulmonary:     Effort: Pulmonary effort is normal. No respiratory distress.  Abdominal:     General: Abdomen is flat.     Tenderness: There  is abdominal tenderness. There is no guarding.       Comments: Area around suprapubic catheter with mild swelling, small amount of blood noted.  Tenderness with movement of the catheter tubing.  Skin:    General: Skin is warm and dry.  Neurological:     Mental Status: She is alert.  Psychiatric:        Mood and Affect: Mood normal.        Behavior: Behavior normal.     ED Results / Procedures / Treatments   Labs (all labs ordered are listed, but only abnormal results are displayed) Labs Reviewed   URINALYSIS, ROUTINE W REFLEX MICROSCOPIC - Abnormal; Notable for the following components:      Result Value   Color, Urine RED (*)    APPearance CLOUDY (*)    Hgb urine dipstick LARGE (*)    Protein, ur >=300 (*)    Leukocytes,Ua MODERATE (*)    RBC / HPF >50 (*)    WBC, UA >50 (*)    All other components within normal limits  URINE CULTURE    EKG None  Radiology No results found.  Procedures SUPRAPUBIC TUBE PLACEMENT  Date/Time: 03/31/2022 9:08 PM  Performed by: Su Monks, PA-C Authorized by: Su Monks, PA-C   Consent:    Consent obtained:  Verbal   Consent given by:  Patient   Risks, benefits, and alternatives were discussed: yes     Risks discussed:  Bleeding and pain Universal protocol:    Patient identity confirmed:  Provided demographic data Sedation:    Sedation type:  None Anesthesia:    Anesthesia method:  None Procedure details:    Complexity:  Simple   Catheter type:  Foley   Catheter size:  16 Fr   Ultrasound guidance: no     Number of attempts:  3 Post-procedure details:    Procedure completion:  Procedure terminated electively by provider Comments:     Patient had significant pain while attempting removal of current suprapubic catheter. Procedure terminated and urology consulted for assistance.      Medications Ordered in ED Medications - No data to display  ED Course/ Medical Decision Making/ A&P                           Medical Decision Making Amount and/or Complexity of Data Reviewed Labs: ordered.  This patient is a 31 y.o. female who presents to the ED for concern of clogged of suprapubic catheter.   Past Medical History / Social History / Additional history: Chart reviewed. Pertinent results include: multiple sclerosis, wheelchair dependence, indwelling suprapubic catheter  Physical Exam: Physical exam performed. The pertinent findings include: Normal vital signs.  Site of suprapubic catheter with mild  erythema and edema.  Small amount of blood noted.  No abdominal tenderness, but significant pain with trying to manipulate the catheter.  Labs: Urinalysis and urine culture collected and results pending. Recommended patient follow up with primary doctor and urologist for any necessary treatment.  Procedures: Catheter unable to be flushed due to significant sediment. Attempted removal and replacement of catheter, terminated due to significant patient discomfort.   Consultations obtained: I consulted with on-call urologist Dr. Sherron Monday who was able to come to the bedside to evaluate the patient and replace the suprapubic catheter.   Disposition: After consideration of the diagnostic results and the patients response to treatment, I feel that emergency department workup does not suggest an emergent  condition requiring admission or immediate intervention beyond what has been performed at this time. The plan is: Discharged home with recommended follow-up with urology about persistent suprapubic catheter issues. The patient is safe for discharge and has been instructed to return immediately for worsening symptoms, change in symptoms or any other concerns.  Final Clinical Impression(s) / ED Diagnoses Final diagnoses:  Suprapubic catheter dysfunction, initial encounter [facility])    Rx / DC Orders ED Discharge Orders     None      Portions of this report may have been transcribed using voice recognition software. Every effort was made to ensure accuracy; however, inadvertent computerized transcription errors may be present.    Jeanella Flattery 03/31/22 2112    Bethann Berkshire, MD 04/08/22 1553

## 2022-03-31 NOTE — ED Triage Notes (Signed)
Pt arrive [facility] for clogged catheter since yesterday.

## 2022-03-31 NOTE — Consult Note (Signed)
Urology Consult  Referring physician: Bethann Berkshire Reason for referral: blocked sp tube  Chief Complaint: blocked s/p tube  History of Present Illness: seen multiple times with blocked s/p tube; followed by urology; normal CT oct 23; has ms; bladder capacity 275 cc UDS; pos urine c/s dec 6; seen for same Dec 6th;  Leak around catheter for 2 days No fever or pain  Past Medical History:  Diagnosis Date   MS (multiple sclerosis) ([facility])    No pertinent past medical history    Past Surgical History:  Procedure Laterality Date   CESAREAN SECTION  08/09/2011   Procedure: CESAREAN SECTION;  Surgeon: Lesly Dukes, MD;  Location: [facility];  Service: Gynecology;  Laterality: N/A;   IR CATHETER TUBE CHANGE  07/22/2021   IR CATHETER TUBE CHANGE  09/10/2021    Medications: I have reviewed the patient's current medications. Allergies: No Known Allergies  Family History  Problem Relation Age of Onset   Diabetes Maternal Grandmother    Hypertension Maternal Grandmother    Kidney disease Maternal Grandmother    Social History:  reports that she has never smoked. She has never used smokeless tobacco. She reports that she does not drink alcohol and does not use drugs.  ROS: All systems are reviewed and negative except as noted. Rest negative  Physical Exam:  Vital signs in last 24 hours: Temp:  [97.7 F (36.5 C)] 97.7 F (36.5 C) (12/20 1350) Pulse Rate:  [101] 101 (12/20 1350) Resp:  [16] 16 (12/20 1350) BP: (96)/(73) 96/73 (12/20 1350) SpO2:  [100 %] 100 % (12/20 1350) Weight:  [58 kg] 58 kg (12/20 1348)  Cardiovascular: Skin warm; not flushed Respiratory: Breaths quiet; no shortness of breath Abdomen: No masses Neurological: Normal sensation to touch Musculoskeletal: Normal motor function arms and legs Lymphatics: No inguinal adenopathy Skin: No rashes Genitourinary:s/p in place  Laboratory Data:  No results found for this or any previous visit (from the past 72 hour(s)). No  results found for this or any previous visit (from the past 240 hour(s)). Creatinine: No results for input(s): "CREATININE" in the last 168 hours.  Xrays: See report/chart noted  Impression/Assessment:  Blocked s/p tube  Plan:  Easily changed s/p tube 16 Fr Pink/clear OK to send home  Rendy Lazard A Desjuan Stearns 03/31/2022, 6:21 PM

## 2022-03-31 NOTE — Discharge Instructions (Addendum)
You were seen in the emergency department for clogging of your suprapubic catheter.  Please follow up with your urologist about your continued issues.

## 2022-03-31 NOTE — ED Notes (Signed)
Called for urology cart.

## 2022-04-03 LAB — URINE CULTURE: Culture: 30000 — AB

## 2022-04-04 ENCOUNTER — Telehealth (HOSPITAL_BASED_OUTPATIENT_CLINIC_OR_DEPARTMENT_OTHER): Payer: Self-pay | Admitting: Emergency Medicine

## 2022-04-04 NOTE — Telephone Encounter (Signed)
Post ED Visit - Positive Culture Follow-up: Successful Patient Follow-Up  Culture assessed and recommendations reviewed by:  []  , Pharm.D. []  Enzo Bi, Pharm.D., BCPS AQ-ID []  , Pharm.D., BCPS []  Celedonio Miyamoto, Pharm.D., BCPS []  [place], Garvin Fila.D., BCPS, AAHIVP []  , Pharm.D., BCPS, AAHIVP []  Georgina Pillion, PharmD, BCPS []  , PharmD, BCPS []  [place], PharmD, BCPS [x]  [place], PharmD  Positive urine culture  []  Patient discharged without antimicrobial prescription and treatment is now indicated []  Organism is resistant to prescribed ED discharge antimicrobial []  Patient with positive blood cultures  Changes discussed with ED provider:  Plan: call patient for symptom check.  Contacted patient, date 04/04/22 by , Pharmacist. Per Lysle Pearl, pt contacted for symptom check. No symptoms reported, instructed to stop Levofloxacin. No further treatment.   Milinda Sweeney 04/04/2022, 1:45 PM

## 2022-04-15 ENCOUNTER — Other Ambulatory Visit: Payer: Self-pay

## 2022-04-15 ENCOUNTER — Emergency Department (HOSPITAL_COMMUNITY)
Admission: EM | Admit: 2022-04-15 | Discharge: 2022-04-15 | Disposition: A | Discharge status: Home / Self Care | Payer: Medicare Other | Attending: Emergency Medicine | Admitting: Emergency Medicine

## 2022-04-15 ENCOUNTER — Encounter (HOSPITAL_COMMUNITY): Payer: Self-pay

## 2022-04-15 DIAGNOSIS — T83198A Other mechanical complication of other urinary devices and implants, initial encounter: Secondary | ICD-10-CM | POA: Diagnosis not present

## 2022-04-15 DIAGNOSIS — Z7401 Bed confinement status: Secondary | ICD-10-CM | POA: Diagnosis not present

## 2022-04-15 DIAGNOSIS — G35 Multiple sclerosis: Secondary | ICD-10-CM | POA: Diagnosis not present

## 2022-04-15 DIAGNOSIS — T83098A Other mechanical complication of other indwelling urethral catheter, initial encounter: Secondary | ICD-10-CM | POA: Diagnosis not present

## 2022-04-15 DIAGNOSIS — Z743 Need for continuous supervision: Secondary | ICD-10-CM | POA: Diagnosis not present

## 2022-04-15 DIAGNOSIS — R279 Unspecified lack of coordination: Secondary | ICD-10-CM | POA: Diagnosis not present

## 2022-04-15 DIAGNOSIS — T83091A Other mechanical complication of indwelling urethral catheter, initial encounter: Secondary | ICD-10-CM | POA: Insufficient documentation

## 2022-04-15 DIAGNOSIS — T83010A Breakdown (mechanical) of cystostomy catheter, initial encounter: Secondary | ICD-10-CM

## 2022-04-15 DIAGNOSIS — R5381 Other malaise: Secondary | ICD-10-CM | POA: Diagnosis not present

## 2022-04-15 NOTE — ED Provider Notes (Signed)
[facility] EMERGENCY DEPARTMENT Provider Note   [name]: [id number] Arrival date & time: 04/15/22  1257     History  Chief Complaint  Patient presents with   foley catheter not draining    Melissa West is a 32 y.o. female.  32 year old female with history of MS with indwelling suprapubic catheter presents with concern for obstructed catheter, no output today, states her urine was cloudy yesterday.  She denies fevers, vomiting, abdominal pain.  No other complaints or concerns today.       Home Medications Prior to Admission medications   Medication Sig Start Date End Date Taking? Authorizing Provider  acetaminophen (TYLENOL) 325 MG tablet Take 2 tablets (650 mg total) by mouth every 6 (six) hours as needed for mild pain (or Fever >/= 101). Patient not taking: Reported on 08/12/2021 05/20/21   Roxan Hockey, MD  acetic acid 0.25 % irrigation Irrigate with as directed daily. Irrigate 60 daily 02/03/22   [name], Candee Furbish, MD  AMBULATORY NON FORMULARY MEDICATION 30 mLs by Intracatheter route daily as needed. Medication Name: Sodium Chloride 0.9% 11/12/21   [name], Candee Furbish, MD  ergocalciferol (VITAMIN D2) 1.25 MG (50000 UT) capsule Take 1 capsule (50,000 Units total) by mouth once a week. 11/07/21   Emokpae, Courage, MD  KESIMPTA 20 MG/0.4ML SOAJ Inject 20 mg into the skin every 30 (thirty) days. 06/24/21   [provider]  levofloxacin (LEVAQUIN) 500 MG tablet Take 500 mg by mouth daily. 03/17/22   [provider]  midodrine (PROAMATINE) 5 MG tablet Take 1 tablet (5 mg total) by mouth 3 (three) times daily with meals. To Help Prevent your Blood Pressure from dropping Patient not taking: Reported on 12/15/2021 11/07/21   Roxan Hockey, MD  nitrofurantoin, macrocrystal-monohydrate, (MACROBID) 100 MG capsule Take 1 capsule (100 mg total) by mouth 2 (two) times daily. Patient not taking: Reported on 01/28/2022 12/15/21   Dion Saucier A, PA  polyvinyl alcohol  (LIQUIFILM TEARS) 1.4 % ophthalmic solution Place 1 drop into both eyes as needed for dry eyes. Patient not taking: Reported on 06/26/2021 06/20/21   Roxan Hockey, MD  senna-docusate (SENOKOT-S) 8.6-50 MG tablet Take 2 tablets by mouth at bedtime. Patient not taking: Reported on 01/28/2022 11/07/21 11/07/22  Roxan Hockey, MD  Syringe, Disposable, (50-60CC SYRINGE) 60 ML MISC Flush 30-50cc of sterile water into catheter daily as needed 01/15/22   Alyson Ingles Candee Furbish, MD  Water For Irrigation, Sterile (STERILE WATER FOR IRRIGATION) Irrigate with 50 mLs as directed daily. Irrigate catheter daily with 50cc of sterile water. Patient not taking: Reported on 03/17/2022 01/15/22   Cleon Gustin, MD      Allergies    Patient has no known allergies.    Review of Systems   Review of Systems Negative except as per HPI Physical Exam Updated Vital Signs BP 93/71   Pulse 75   Temp 98 F (36.7 C)   Resp 16   Ht 5\' 3"  (1.6 m)   Wt 57.6 kg   LMP 03/24/2022 (Approximate)   SpO2 99%   BMI 22.50 kg/m  Physical Exam Vitals and nursing note reviewed.  Constitutional:      General: She is not in acute distress.    Appearance: She is well-developed. She is not diaphoretic.  HENT:     Head: Normocephalic and atraumatic.  Pulmonary:     Effort: Pulmonary effort is normal.  Abdominal:     Palpations: Abdomen is soft.     Tenderness: There is no  abdominal tenderness.     Comments: Suprapubic catheter present  Skin:    General: Skin is warm and dry.     Findings: No erythema or rash.  Neurological:     Mental Status: She is alert and oriented to person, place, and time.  Psychiatric:        Behavior: Behavior normal.     ED Results / Procedures / Treatments   Labs (all labs ordered are listed, but only abnormal results are displayed) Labs Reviewed - No data to display   EKG None  Radiology No results found.  Procedures BLADDER CATHETERIZATION  Date/Time: 04/15/2022 3:21  PM  Performed by: Tacy Learn, PA-C Authorized by: Tacy Learn, PA-C   Consent:    Consent obtained:  Verbal   Consent given by:  Patient   Risks discussed:  False passage, infection, pain and incomplete procedure   Alternatives discussed:  No treatment Universal protocol:    Patient identity confirmed:  Verbally with patient Pre-procedure details:    Preparation: Patient was prepped and draped in usual sterile fashion   Anesthesia:    Anesthesia method:  None Procedure details:    Catheter insertion:  Indwelling   Catheter type:  Foley   Catheter size:  16 Fr   Bladder irrigation: no     Number of attempts:  1   Urine appearance: pink tinged, cloudy. Post-procedure details:    Procedure completion:  Tolerated well, no immediate complications     Medications Ordered in ED Medications - No data to display  ED Course/ Medical Decision Making/ A&P                           Medical Decision Making Amount and/or Complexity of Data Reviewed Labs: ordered.   32 year old female with indwelling suprapubic catheter secondary to MS presents with concern for catheter not draining.  This is a recurring problem for her, last exchanged to this emergency room on March 31, 2022, has not been exchanged since that time, does not have regularly scheduled catheter changes.  Catheter in place is dirty, urine in bag has pink-tinged cloudy.  Catheter was exchanged without difficulty, urine is pink-tinged and cloudy.  No symptoms, prior culture reviewed, colonized.  Patient is discharged to recheck with her urologist.        Final Clinical Impression(s) / ED Diagnoses Final diagnoses:  Suprapubic catheter dysfunction, initial encounter [facility])    Rx / DC Orders ED Discharge Orders     None         Tacy Learn, PA-C 04/15/22 [place], [place], DO 04/16/22 [phone]

## 2022-04-15 NOTE — ED Notes (Signed)
Suprapubic catheter replaced by provider, securement device applied by this RN. Pt ao x 4, grape juice provided per pt's request. Per secretary, [facility] was already called for ride home.

## 2022-04-15 NOTE — Discharge Instructions (Signed)
Follow up with your urologist to schedule regular catheter changes.

## 2022-04-15 NOTE — ED Triage Notes (Signed)
Pt reports catheter blocked today.  Unsure of what time.   Reports urine was cloudy yesterday.  Denies any pain.

## 2022-04-16 ENCOUNTER — Telehealth: Payer: Self-pay

## 2022-04-16 NOTE — Telephone Encounter (Addendum)
Patient called to see why she has not been having her SP tube changed in office.  Patient has been to the hospital due to catheter not draining and the hospital has been changing her sp tube.  Returned her call, she had just been to the hospital and had her sp tube changed on 04/16/2021.  I scheduled patient a f/u for sp change on 02/05.  I informed her that if she is unable to make the apt she would have to call our office and reschedule and apt.  Patient voiced understanding.  Apt reminder mailed to patient.

## 2022-04-21 ENCOUNTER — Ambulatory Visit: Payer: Medicare Other

## 2022-04-21 ENCOUNTER — Telehealth: Payer: Self-pay

## 2022-04-21 NOTE — Telephone Encounter (Signed)
Patient called to see if she is able to come in and have her sp catheter changed due to sediment clogging the catheter.  Verbal from Dr. Alyson Ingles to have patient come in and upsize her sp tube from a 22f to an 63f.  Called patient to scheduled nurse visit for today, patient agreed.

## 2022-04-22 ENCOUNTER — Ambulatory Visit (INDEPENDENT_AMBULATORY_CARE_PROVIDER_SITE_OTHER): Payer: Medicare Other | Admitting: Urology

## 2022-04-22 DIAGNOSIS — R339 Retention of urine, unspecified: Secondary | ICD-10-CM

## 2022-04-22 DIAGNOSIS — G35 Multiple sclerosis: Secondary | ICD-10-CM | POA: Diagnosis not present

## 2022-04-22 NOTE — Progress Notes (Signed)
Suprapubic Cath Change  Patient is present today for a suprapubic catheter change due to urinary retention.  10ml of water was drained from the balloon, a 16FR foley cath was removed from the tract with out difficulty.  Site was cleaned and prepped in a sterile fashion with betadine.  A 18FR foley cath was replaced into the tract no complications were noted. Catheter was flushed with 60cc of sterile water with return.  10 ml of sterile water was inflated into the balloon and a bed bag was attached for drainage.  Patient tolerated well. A night bag was given to patient and proper instruction was given on how to switch bags.    Performed by: Levi Aland, CMA  Follow up: Patient provided with extra catheter supplies and aware she will follow up as scheduled.

## 2022-04-25 DIAGNOSIS — G35 Multiple sclerosis: Secondary | ICD-10-CM | POA: Diagnosis not present

## 2022-04-30 ENCOUNTER — Other Ambulatory Visit: Payer: Self-pay

## 2022-04-30 ENCOUNTER — Emergency Department (HOSPITAL_COMMUNITY)
Admission: EM | Admit: 2022-04-30 | Discharge: 2022-04-30 | Disposition: A | Discharge status: Home / Self Care | Payer: 59 | Attending: Emergency Medicine | Admitting: Emergency Medicine

## 2022-04-30 ENCOUNTER — Encounter (HOSPITAL_COMMUNITY): Payer: Self-pay | Admitting: *Deleted

## 2022-04-30 DIAGNOSIS — R531 Weakness: Secondary | ICD-10-CM | POA: Diagnosis not present

## 2022-04-30 DIAGNOSIS — Z743 Need for continuous supervision: Secondary | ICD-10-CM | POA: Diagnosis not present

## 2022-04-30 DIAGNOSIS — L02412 Cutaneous abscess of left axilla: Secondary | ICD-10-CM | POA: Diagnosis not present

## 2022-04-30 DIAGNOSIS — R5381 Other malaise: Secondary | ICD-10-CM | POA: Diagnosis not present

## 2022-04-30 DIAGNOSIS — M79603 Pain in arm, unspecified: Secondary | ICD-10-CM | POA: Diagnosis not present

## 2022-04-30 DIAGNOSIS — Z7401 Bed confinement status: Secondary | ICD-10-CM | POA: Diagnosis not present

## 2022-04-30 MED ORDER — LIDOCAINE HCL (PF) 1 % IJ SOLN
10.0000 mL | Freq: Once | INTRAMUSCULAR | Status: AC
  Administered 2022-04-30: 10 mL
  Filled 2022-04-30: qty 10

## 2022-04-30 MED ORDER — DOXYCYCLINE HYCLATE 100 MG PO CAPS: 100.0000 mg | ORAL_CAPSULE | Freq: Two times a day (BID) | ORAL | 0 refills | Status: DC

## 2022-04-30 NOTE — ED Provider Notes (Signed)
[place] EMERGENCY DEPARTMENT AT [facility] Provider Note   [name]: [id number] Arrival date & time: 04/30/22  1138     History  Chief Complaint  Patient presents with   Abscess    Melissa West is a 32 y.o. female.  Patient with history of MS presents today with complaints of abscess. She states that same is located in her left underarm and has been draining. States she first noticed this about 1 week ago and it has been worsening since. She denies any fevers or chills. Denies any history of abscesses previously. She is also concerned that she has a developing abscess around her suprapubic catheter site as it is sore but not draining. She denies any abdominal pain, nausea, vomiting, or diarrhea.  The history is provided by the patient. No language interpreter was used.  Abscess      Home Medications Prior to Admission medications   Medication Sig Start Date End Date Taking? Authorizing Provider  acetaminophen (TYLENOL) 325 MG tablet Take 2 tablets (650 mg total) by mouth every 6 (six) hours as needed for mild pain (or Fever >/= 101). Patient not taking: Reported on 04/30/2022 05/20/21   Shon Hale, MD  acetic acid 0.25 % irrigation Irrigate with as directed daily. Irrigate 60 daily Patient not taking: Reported on 04/30/2022 02/03/22   Malen Gauze, MD  AMBULATORY NON FORMULARY MEDICATION 30 mLs by Intracatheter route daily as needed. Medication Name: Sodium Chloride 0.9% Patient not taking: Reported on 04/30/2022 11/12/21   Malen Gauze, MD  ergocalciferol (VITAMIN D2) 1.25 MG (50000 UT) capsule Take 1 capsule (50,000 Units total) by mouth once a week. Patient not taking: Reported on 04/30/2022 11/07/21   Shon Hale, MD  KESIMPTA 20 MG/0.4ML SOAJ Inject 20 mg into the skin every 30 (thirty) days. Patient not taking: Reported on 04/30/2022 06/24/21   [provider]  levofloxacin (LEVAQUIN) 500 MG tablet Take 500 mg by mouth daily. Patient  not taking: Reported on 04/30/2022 03/17/22   [provider]  midodrine (PROAMATINE) 5 MG tablet Take 1 tablet (5 mg total) by mouth 3 (three) times daily with meals. To Help Prevent your Blood Pressure from dropping Patient not taking: Reported on 04/30/2022 11/07/21   Shon Hale, MD  nitrofurantoin, macrocrystal-monohydrate, (MACROBID) 100 MG capsule Take 1 capsule (100 mg total) by mouth 2 (two) times daily. Patient not taking: Reported on 04/30/2022 12/15/21   Sherian Maroon A, PA  polyvinyl alcohol (LIQUIFILM TEARS) 1.4 % ophthalmic solution Place 1 drop into both eyes as needed for dry eyes. Patient not taking: Reported on 04/30/2022 06/20/21   Shon Hale, MD  senna-docusate (SENOKOT-S) 8.6-50 MG tablet Take 2 tablets by mouth at bedtime. Patient not taking: Reported on 04/30/2022 11/07/21 11/07/22  Shon Hale, MD  Syringe, Disposable, (50-60CC SYRINGE) 60 ML MISC Flush 30-50cc of sterile water into catheter daily as needed Patient not taking: Reported on 04/30/2022 01/15/22   Malen Gauze, MD  Water For Irrigation, Sterile (STERILE WATER FOR IRRIGATION) Irrigate with 50 mLs as directed daily. Irrigate catheter daily with 50cc of sterile water. Patient not taking: Reported on 04/30/2022 01/15/22   Malen Gauze, MD      Allergies    Patient has no known allergies.    Review of Systems   Review of Systems  Skin:  Positive for wound.  All other systems reviewed and are negative.   Physical Exam Updated Vital Signs BP 106/74   Pulse (!) 119  Temp 98 F (36.7 C) (Oral)   Ht 5\' 3"  (1.6 m)   Wt 57.6 kg   LMP 03/24/2022 (Approximate)   SpO2 100%   BMI 22.49 kg/m  Physical Exam Vitals and nursing note reviewed.  Constitutional:      General: She is not in acute distress.    Appearance: Normal appearance. She is normal weight. She is not ill-appearing, toxic-appearing or diaphoretic.  HENT:     Head: Normocephalic and atraumatic.  Cardiovascular:      Rate and Rhythm: Normal rate.  Pulmonary:     Effort: Pulmonary effort is normal. No respiratory distress.  Abdominal:     General: Abdomen is flat.     Palpations: Abdomen is soft.     Tenderness: There is no abdominal tenderness.     Comments: Small area of erythema noted underneath the suprapubic catheter site which appears to be skin irritation from the catheter laying on it. No fluctuance or induration. No drainage  Musculoskeletal:        General: Normal range of motion.     Cervical back: Normal range of motion.  Skin:    General: Skin is warm and dry.     Comments: Large 3 cm x 3 cm area of fluctuance with surrounding induration noted to the left axilla with punctate opening draining purulence.  Neurological:     General: No focal deficit present.     Mental Status: She is alert.  Psychiatric:        Mood and Affect: Mood normal.        Behavior: Behavior normal.     ED Results / Procedures / Treatments   Labs (all labs ordered are listed, but only abnormal results are displayed) Labs Reviewed - No data to display  EKG None  Radiology No results found.  Procedures .Marland KitchenIncision and Drainage  Date/Time: 04/30/2022 3:30 PM  Performed by: Bud Face, PA-C Authorized by: Bud Face, PA-C   Consent:    Consent obtained:  Verbal   Consent given by:  Patient   Risks, benefits, and alternatives were discussed: yes     Risks discussed:  Bleeding, damage to other organs, infection, incomplete drainage and pain   Alternatives discussed:  No treatment, delayed treatment, alternative treatment, referral and observation Universal protocol:    Procedure explained and questions answered to patient or proxy's satisfaction: yes     Patient identity confirmed:  Verbally with patient Location:    Type:  Abscess   Size:  3 cm x 3 cm   Location:  Upper extremity   Upper extremity location: left axilla. Pre-procedure details:    Skin preparation:   Povidone-iodine Anesthesia:    Anesthesia method:  Local infiltration   Local anesthetic:  Lidocaine 1% w/o epi Procedure type:    Complexity:  Simple Procedure details:    Incision types:  Cruciate   Wound management:  Probed and deloculated   Drainage:  Bloody and purulent   Drainage amount:  Moderate   Wound treatment:  Wound left open   Packing materials:  None Post-procedure details:    Procedure completion:  Tolerated well, no immediate complications     Medications Ordered in ED Medications  lidocaine (PF) (XYLOCAINE) 1 % injection 10 mL (has no administration in time range)    ED Course/ Medical Decision Making/ A&P  Medical Decision Making Risk Prescription drug management.   Patient presents today with complaints of left axilla abscess. She is afebrile, non-toxic appearing, and in no acute distress with reassuring vital signs. Physical exam reveals area of fluctuance with surrounding induration noted to the left axilla. Patient with skin abscess amenable to incision and drainage per above procedure which was well tolerated.  Abscess was not large enough to warrant packing or drain, plan for wound recheck in 2 days. Encouraged home warm soaks and flushing.  Mild signs of cellulitis is surrounding skin.  Will d/c to home with doxycycline. She did also have an area of skin irritation under her suprapubic catheter which is likely due to the way her catheter sits on her skin. No signs of abscess or infection. Catheter care recommended and discussed. Patient is stable for discharge. She is understanding and amenable with plan, educated on red flag symptoms that would prompt immediate return. Patient discharged in stable condition.   Final Clinical Impression(s) / ED Diagnoses Final diagnoses:  Abscess of left axilla    Rx / DC Orders ED Discharge Orders          Ordered    doxycycline (VIBRAMYCIN) 100 MG capsule  2 times daily         04/30/22 1538          An After Visit Summary was printed and given to the patient.     Nestor Lewandowsky 04/30/22 1541    Milton Ferguson, MD 05/04/22 1004

## 2022-04-30 NOTE — ED Triage Notes (Signed)
Pt c/o abscess to left axilla and pain to right great toe; pt has large abscess to left axilla and pt states it has been draining because her clothes have been wet

## 2022-04-30 NOTE — Discharge Instructions (Addendum)
You were seen in the emergency department for an abscess.  We have drained the area and cleaned it. I would like you to have the wound checked in 2-3 days. This can be done by any doctor's office, urgent care, or emergency department. This is to make sure the area hasn't closed too soon. Try to keep the area as clean and dry as possible. I recommend rinsing the area with warm soap and water as often as possible to ensure that it stays open and heals appropriately.  I am placing you on a course of antibiotics. It is important you finish the entire course! You can take ibuprofen or tylenol as needed for pain.   Additionally, you mentioned concern about an area under your catheter. This appears to be skin irritation likely from the catheter lying on your skin. Please place a barrier cream under this area and keep it clean and dry to ensure resolution of this.   Continue to monitor how you're doing and return to the ER for new or worsening symptoms.

## 2022-05-03 ENCOUNTER — Telehealth: Payer: Self-pay

## 2022-05-03 ENCOUNTER — Other Ambulatory Visit (HOSPITAL_COMMUNITY): Payer: Self-pay

## 2022-05-03 NOTE — Telephone Encounter (Signed)
Tried calling patient with no answer. Left vm for return call 

## 2022-05-03 NOTE — Telephone Encounter (Signed)
Received a request from [facility] for PA for Kesimpta 2MG /0.4ML-

## 2022-05-04 ENCOUNTER — Telehealth: Payer: Self-pay

## 2022-05-04 NOTE — Telephone Encounter (Signed)
     Patient  visit on 04/30/2022  at [facility] was for Abscess of left axilla.   Have you been able to follow up with your primary care physician? No. Patient will follow up as needed.  The patient was or was not able to obtain any needed medicine or equipment. Patient obtained medication.  Are there diet recommendations that you are having difficulty following? No  Patient expresses understanding of discharge instructions and education provided has no other needs at this time.    [place] [facility] Guide   [email or web address]  ?? [phone]   Website: [email or web address]  [email or web address]

## 2022-05-10 ENCOUNTER — Emergency Department (HOSPITAL_COMMUNITY)
Admission: EM | Admit: 2022-05-10 | Discharge: 2022-05-11 | Disposition: A | Discharge status: Home / Self Care | Payer: 59 | Attending: Emergency Medicine | Admitting: Emergency Medicine

## 2022-05-10 ENCOUNTER — Encounter (HOSPITAL_COMMUNITY): Payer: Self-pay | Admitting: *Deleted

## 2022-05-10 ENCOUNTER — Other Ambulatory Visit: Payer: Self-pay

## 2022-05-10 DIAGNOSIS — T83091A Other mechanical complication of indwelling urethral catheter, initial encounter: Secondary | ICD-10-CM | POA: Diagnosis not present

## 2022-05-10 DIAGNOSIS — T839XXA Unspecified complication of genitourinary prosthetic device, implant and graft, initial encounter: Secondary | ICD-10-CM

## 2022-05-10 DIAGNOSIS — R9431 Abnormal electrocardiogram [ECG] [EKG]: Secondary | ICD-10-CM | POA: Diagnosis not present

## 2022-05-10 DIAGNOSIS — Z466 Encounter for fitting and adjustment of urinary device: Secondary | ICD-10-CM | POA: Diagnosis not present

## 2022-05-10 DIAGNOSIS — T83098A Other mechanical complication of other indwelling urethral catheter, initial encounter: Secondary | ICD-10-CM | POA: Diagnosis not present

## 2022-05-10 LAB — BASIC METABOLIC PANEL
Anion gap: 13 (ref 5–15)
BUN: 14 mg/dL (ref 6–20)
CO2: 18 mmol/L — ABNORMAL LOW (ref 22–32)
Calcium: 8.9 mg/dL (ref 8.9–10.3)
Chloride: 108 mmol/L (ref 98–111)
Creatinine, Ser: 0.67 mg/dL (ref 0.44–1.00)
GFR, Estimated: >60 mL/min (ref >60)
Glucose, Bld: 78 mg/dL (ref 70–99)
Potassium: 4.1 mmol/L (ref 3.5–5.1)
Sodium: 139 mmol/L (ref 135–145)

## 2022-05-10 LAB — MAGNESIUM: Magnesium: 2.4 mg/dL (ref 1.7–2.4)

## 2022-05-10 NOTE — ED Notes (Signed)
Pt refusing blood work

## 2022-05-10 NOTE — Discharge Instructions (Addendum)
Your catheter was replaced today.  You did not want to repeat lab work to assess for any sepsis.  You requested discharge prior to also obtaining a urine sample as well. Please return to the emergency department with any worsening symptoms, especially fever, chills, increased heart rate, confusion or lack of output in your urinary catheter bag.  It was a pleasure to meet you and we hope you feel better!

## 2022-05-10 NOTE — ED Triage Notes (Signed)
Pt here for wound recheck to left axilla.  Pt states her foley catheter is clogged again.

## 2022-05-10 NOTE — ED Notes (Signed)
Pt refusing blood work . Just wants to go home.

## 2022-05-10 NOTE — ED Provider Notes (Signed)
EMERGENCY DEPARTMENT AT [facility] Provider Note   [name]: [id number] Arrival date & time: 05/10/22  1254     History  Chief Complaint  Patient presents with   Wound Check    Melissa West is a 32 y.o. female with a past medical history of MS, wheelchair-bound presenting today with a clogged Foley catheter for the past 3 days.  No abdominal pain.  No fevers or chills.  Also reports wanting a wound to the left axilla reexamined  Wound Check       Home Medications Prior to Admission medications   Medication Sig Start Date End Date Taking? Authorizing Provider  acetaminophen (TYLENOL) 325 MG tablet Take 2 tablets (650 mg total) by mouth every 6 (six) hours as needed for mild pain (or Fever >/= 101). Patient not taking: Reported on 04/30/2022 05/20/21   Shon Hale, MD  acetic acid 0.25 % irrigation Irrigate with as directed daily. Irrigate 60 daily Patient not taking: Reported on 04/30/2022 02/03/22   Malen Gauze, MD  AMBULATORY NON FORMULARY MEDICATION 30 mLs by Intracatheter route daily as needed. Medication Name: Sodium Chloride 0.9% Patient not taking: Reported on 04/30/2022 11/12/21   Malen Gauze, MD  doxycycline (VIBRAMYCIN) 100 MG capsule Take 1 capsule (100 mg total) by mouth 2 (two) times daily. 04/30/22   Smoot, Shawn Route, PA-C  ergocalciferol (VITAMIN D2) 1.25 MG (50000 UT) capsule Take 1 capsule (50,000 Units total) by mouth once a week. Patient not taking: Reported on 04/30/2022 11/07/21   Shon Hale, MD  KESIMPTA 20 MG/0.4ML SOAJ Inject 20 mg into the skin every 30 (thirty) days. Patient not taking: Reported on 04/30/2022 06/24/21   [provider]  levofloxacin (LEVAQUIN) 500 MG tablet Take 500 mg by mouth daily. Patient not taking: Reported on 04/30/2022 03/17/22   [provider]  midodrine (PROAMATINE) 5 MG tablet Take 1 tablet (5 mg total) by mouth 3 (three) times daily with meals. To Help Prevent your  Blood Pressure from dropping Patient not taking: Reported on 04/30/2022 11/07/21   Shon Hale, MD  nitrofurantoin, macrocrystal-monohydrate, (MACROBID) 100 MG capsule Take 1 capsule (100 mg total) by mouth 2 (two) times daily. Patient not taking: Reported on 04/30/2022 12/15/21   Sherian Maroon A, PA  polyvinyl alcohol (LIQUIFILM TEARS) 1.4 % ophthalmic solution Place 1 drop into both eyes as needed for dry eyes. Patient not taking: Reported on 04/30/2022 06/20/21   Shon Hale, MD  senna-docusate (SENOKOT-S) 8.6-50 MG tablet Take 2 tablets by mouth at bedtime. Patient not taking: Reported on 04/30/2022 11/07/21 11/07/22  Shon Hale, MD  Syringe, Disposable, (50-60CC SYRINGE) 60 ML MISC Flush 30-50cc of sterile water into catheter daily as needed Patient not taking: Reported on 04/30/2022 01/15/22   Malen Gauze, MD  Water For Irrigation, Sterile (STERILE WATER FOR IRRIGATION) Irrigate with 50 mLs as directed daily. Irrigate catheter daily with 50cc of sterile water. Patient not taking: Reported on 04/30/2022 01/15/22   Malen Gauze, MD      Allergies    Patient has no known allergies.    Review of Systems   Review of Systems  Physical Exam Updated Vital Signs BP 106/76 (BP Location: Left Arm)   Pulse (!) 144   Temp 98 F (36.7 C) (Oral)   Resp 20   LMP 05/09/2022 (Approximate)   SpO2 99%  Physical Exam Vitals and nursing note reviewed.  Constitutional:      General: She is not in acute  distress.    Appearance: Normal appearance. She is not ill-appearing.  HENT:     Head: Normocephalic and atraumatic.  Eyes:     General: No scleral icterus.    Conjunctiva/sclera: Conjunctivae normal.  Pulmonary:     Effort: Pulmonary effort is normal. No respiratory distress.  Abdominal:     General: Abdomen is flat.     Palpations: Abdomen is soft.     Tenderness: There is no abdominal tenderness.     Comments: Suprapubic catheter in place.  Very small amount of  purulent drainage from the insertion site.  No surrounding cellulitis.  Skin:    General: Skin is warm and dry.     Findings: No rash.  Neurological:     Mental Status: She is alert.  Psychiatric:        Mood and Affect: Mood normal.     ED Results / Procedures / Treatments   Labs (all labs ordered are listed, but only abnormal results are displayed) Labs Reviewed  CBC WITH DIFFERENTIAL/PLATELET  BASIC METABOLIC PANEL  MAGNESIUM  URINALYSIS, W/ REFLEX TO CULTURE (INFECTION SUSPECTED)    EKG EKG Interpretation  Date/Time:  Monday May 10 2022 13:40:38 EST Ventricular Rate:  128 PR Interval:  140 QRS Duration: 64 QT Interval:  290 QTC Calculation: 423 R Axis:   92 Text Interpretation:  Suspect arm lead reversal, interpretation assumes no reversal Sinus tachycardia Rightward axis Borderline ECG When compared with ECG of 17-Jun-2021 11:11,  rate increased Confirmed by Benjiman Core [phone]) on 05/10/2022 3:56:40 PM  Radiology No results found.  Procedures ASPIRATION BLADDER INSERT SUPRAPUBIC CATHETER  Date/Time: 05/10/2022 10:07 PM  Performed by: Saddie Benders, PA-C Authorized by: Saddie Benders, PA-C  Consent: Verbal consent obtained. Consent given by: patient Patient identity confirmed: verbally with patient Local anesthesia used: no  Anesthesia: Local anesthesia used: no  Sedation: Patient sedated: no  Patient tolerance: patient tolerated the procedure well with no immediate complications Comments: First patient's Foley catheter was inserted and appeared to have come out of her urethra.  Second insertion confirmed in the bladder.  Very small amount of urine output.  Secured in place on patient's leg.      Medications Ordered in ED Medications - No data to display  ED Course/ Medical Decision Making/ A&P                             Medical Decision Making Amount and/or Complexity of Data Reviewed Labs: ordered.   32 year old female  with a past medical history of MS, wheelchair-bound presenting today with a clogged urinary catheter.    Past Medical History / Co-morbidities / Social History: Indwelling Foley catheter secondary to her MS and paraplegia.   Additional history: Patient recently seen for an abscess to the left axilla 10 days ago.  Well-healed on my exam.  Has multiple visits nearly every 2 weeks for suprapubic catheter issue.   Physical Exam: Pertinent physical exam findings include Well-appearing  Lab Tests: I ordered, and personally interpreted labs.  The pertinent results include: Normal kidney function CBC clotted and patient refused second draw.  Imaging Studies: Considered CT imaging however patient is nontender, do not believe this is necessary at this time.  MDM/Disposition: This is a 32 year old female presenting today due to a clogged Foley catheter for the past 3 days.  She says that there has been very minimal output.  Nursing staff unable to flush urinary catheter.  Suprapubic catheter was.  Initially appeared to come out of her urethra however this was pulled back and very small amount of urine came into the Foley bag.  Nursing staff BladderScan the patient after insertion and her bladder read 0.  No urinalysis obtained however patient does not want to wait to be able to provide 1.  We got a chemistry back that showed a normal kidney function.  Unfortunately, her CBC clotted and she refused a second stick.  She is borderline hypotensive, tachycardic and has multiple previous visits for sepsis secondary to catheter problem.  She says that she understands the risk and would prefer to be discharged because "this is not what I came for."  She also asked me to reexamine a wound in the left axilla which was healing well.  Will be discharged at this time per patient request.  She reports having a PCP follow-up this week.  Return precautions discussed.  ED Diagnoses Final diagnoses:  Problem with Foley  catheter, initial encounter [facility])    Rx / DC Orders ED Discharge Orders     None      Results and diagnoses were explained to the patient. Return precautions discussed in full. Patient had no additional questions and expressed complete understanding.   This chart was dictated using voice recognition software.  Despite best efforts to proofread,  errors can occur which can change the documentation meaning.    Woodroe Chen 05/10/22 2211    Benjiman Core, MD 05/11/22 [phone]

## 2022-05-10 NOTE — ED Provider Triage Note (Signed)
Emergency Medicine Provider Triage Evaluation Note  Cassady Stanczak , a 32 y.o. female  was evaluated in triage.  Patient presenting today with concern for a clogged urinary catheter.  Reports it has been that way for "a couple days."  Denies any pain.  Review of Systems  Positive:  Negative:   Physical Exam  BP 106/76 (BP Location: Left Arm)   Pulse (!) 144   Temp 98 F (36.7 C) (Oral)   Resp 20   LMP 05/09/2022 (Approximate)   SpO2 99%  Gen:   Awake, no distress   Resp:  Normal effort  MSK:   Moves extremities without difficulty  Other:  Urinary catheter bag with some urine.  Cloudy with sediment  Medical Decision Making  Medically screening exam initiated at 4:08 PM.  Appropriate orders placed.  Kelaiah Kinder was informed that the remainder of the evaluation will be completed by another provider, this initial triage assessment does not replace that evaluation, and the importance of remaining in the ED until their evaluation is complete.    Tachycardic, EKG performed in triage.  Will add basic labs.   Rhae Hammock, PA-C 05/10/22 1609

## 2022-05-11 DIAGNOSIS — R5381 Other malaise: Secondary | ICD-10-CM | POA: Diagnosis not present

## 2022-05-11 DIAGNOSIS — R531 Weakness: Secondary | ICD-10-CM | POA: Diagnosis not present

## 2022-05-11 DIAGNOSIS — Z7401 Bed confinement status: Secondary | ICD-10-CM | POA: Diagnosis not present

## 2022-05-12 ENCOUNTER — Ambulatory Visit: Payer: 59

## 2022-05-12 ENCOUNTER — Telehealth: Payer: Self-pay

## 2022-05-12 NOTE — Telephone Encounter (Signed)
        Patient  visited [facility] on 1/30     Telephone encounter attempt :  1st  A HIPAA compliant voice message was left requesting a return call.  Instructed patient to call back .    [facility] [phone] [address], [place], [place] [address] Phone: [phone] Email: [email or web address]

## 2022-05-17 ENCOUNTER — Other Ambulatory Visit (HOSPITAL_COMMUNITY): Payer: Self-pay

## 2022-05-17 ENCOUNTER — Ambulatory Visit (INDEPENDENT_AMBULATORY_CARE_PROVIDER_SITE_OTHER): Payer: 59 | Admitting: Urology

## 2022-05-17 DIAGNOSIS — R5381 Other malaise: Secondary | ICD-10-CM | POA: Diagnosis not present

## 2022-05-17 DIAGNOSIS — R339 Retention of urine, unspecified: Secondary | ICD-10-CM

## 2022-05-17 DIAGNOSIS — Z9889 Other specified postprocedural states: Secondary | ICD-10-CM

## 2022-05-17 DIAGNOSIS — R69 Illness, unspecified: Secondary | ICD-10-CM | POA: Diagnosis not present

## 2022-05-17 DIAGNOSIS — Z743 Need for continuous supervision: Secondary | ICD-10-CM | POA: Diagnosis not present

## 2022-05-17 NOTE — Progress Notes (Signed)
Suprapubic Cath Change  Patient is present today for a suprapubic catheter change due to urinary retention.  10 ml of water was drained from the balloon, a 18 FR foley cath was removed from the tract with out difficulty.  Site was cleaned and prepped in a sterile fashion with betadine.  A 18 FR foley cath was replaced into the tract and flushed with 200 ml sterile water no complications were noted. Urine return was noted, 10 ml of sterile water was inflated into the balloon and a leg bag was attached for drainage.  Patient tolerated well. A night bag was given to patient and proper instruction was given on how to switch bags.    Performed by: Marisue Brooklyn, CMA  Follow up: Keep NV

## 2022-05-18 ENCOUNTER — Telehealth: Payer: Self-pay

## 2022-05-18 NOTE — Telephone Encounter (Signed)
        Patient  visited [facility] on 1/30   Telephone encounter attempt :  2nd  A HIPAA compliant voice message was left requesting a return call.  Instructed patient to call back.    [facility] [phone] [address], [place], [place] [address] Phone: [phone] Email: [email or web address]

## 2022-06-10 ENCOUNTER — Encounter: Payer: Self-pay | Admitting: Radiology

## 2022-06-16 ENCOUNTER — Ambulatory Visit (INDEPENDENT_AMBULATORY_CARE_PROVIDER_SITE_OTHER): Payer: 59 | Admitting: Urology

## 2022-06-16 DIAGNOSIS — R339 Retention of urine, unspecified: Secondary | ICD-10-CM | POA: Diagnosis not present

## 2022-06-16 DIAGNOSIS — R5381 Other malaise: Secondary | ICD-10-CM | POA: Diagnosis not present

## 2022-06-16 DIAGNOSIS — Z7401 Bed confinement status: Secondary | ICD-10-CM | POA: Diagnosis not present

## 2022-06-16 DIAGNOSIS — R531 Weakness: Secondary | ICD-10-CM | POA: Diagnosis not present

## 2022-06-16 NOTE — Progress Notes (Addendum)
Suprapubic Cath Change  Patient is present today for a suprapubic catheter change due to urinary retention.  10ml of water was drained from the balloon, a 18FR foley cath was removed from the tract with out difficulty.  Site was cleaned and prepped in a sterile fashion with betadine.  A 18FR foley cath was replaced into the tract no complications were noted. Urine return was noted, 10 ml of sterile water was inflated into the balloon and a leg bag was attached for drainage.  Patient tolerated well. A night bag was given to patient and proper instruction was given on how to switch bags. Patient to receive legs bags from [facility]   Performed by: Guss Bunde, CMA  Follow up: Follow up as scheduled.

## 2022-07-12 ENCOUNTER — Ambulatory Visit (INDEPENDENT_AMBULATORY_CARE_PROVIDER_SITE_OTHER): Payer: 59 | Admitting: Urology

## 2022-07-12 DIAGNOSIS — R339 Retention of urine, unspecified: Secondary | ICD-10-CM

## 2022-07-12 NOTE — Progress Notes (Signed)
Suprapubic Cath Change  Patient is present today for a suprapubic catheter change due to urinary retention.  8ml of water was drained from the balloon, a 18FR foley cath was removed from the tract with out difficulty.  Site was cleaned and prepped in a sterile fashion with betadine.  A 18FR foley cath was replaced into the tract no complications were noted. Urine return was noted, 10 ml of sterile water was inflated into the balloon and a leg bag was attached for drainage.  Patient tolerated well. A night bag was given to patient and proper instruction was given on how to switch bags.    Performed by: Asako Saliba LPN  Follow up: Keep scheduled NV

## 2022-08-05 DIAGNOSIS — G35 Multiple sclerosis: Secondary | ICD-10-CM | POA: Diagnosis not present

## 2022-08-12 ENCOUNTER — Ambulatory Visit (INDEPENDENT_AMBULATORY_CARE_PROVIDER_SITE_OTHER): Payer: 59

## 2022-08-12 DIAGNOSIS — R339 Retention of urine, unspecified: Secondary | ICD-10-CM | POA: Diagnosis not present

## 2022-08-12 MED ORDER — AMBULATORY NON FORMULARY MEDICATION: 12 refills | Status: AC

## 2022-08-12 NOTE — Progress Notes (Signed)
Suprapubic Cath Change  Patient is present today for a suprapubic catheter change due to urinary retention.  10 ml of water was drained from the balloon, a 18 FR foley cath was removed from the tract with out difficulty.  Site was cleaned and prepped in a sterile fashion with betadine.  A 18 FR foley cath was replaced into the tract no complications were noted. 50 cc flushed return was noted, 10 ml of sterile water was inflated into the balloon and a leg bag was attached for drainage.  Patient tolerated well. A night bag was given to patient and proper instruction was given on how to switch bags.    Performed by: Kennyth Lose, CMA  Follow up: No follow-ups on file.

## 2022-08-17 ENCOUNTER — Telehealth: Payer: Self-pay

## 2022-08-17 NOTE — Telephone Encounter (Signed)
Patient called advising that [facility] no longer supplies leg bags. The patient wanted to know if we were aware of another pharmacy that would be able to fill that prescription.

## 2022-08-18 NOTE — Telephone Encounter (Signed)
Open in error

## 2022-08-23 ENCOUNTER — Encounter (HOSPITAL_COMMUNITY): Payer: Self-pay

## 2022-08-23 ENCOUNTER — Emergency Department (HOSPITAL_COMMUNITY)
Admission: EM | Admit: 2022-08-23 | Discharge: 2022-08-23 | Disposition: A | Discharge status: Home / Self Care | Payer: 59 | Attending: Emergency Medicine | Admitting: Emergency Medicine

## 2022-08-23 ENCOUNTER — Other Ambulatory Visit: Payer: Self-pay

## 2022-08-23 DIAGNOSIS — T83091A Other mechanical complication of indwelling urethral catheter, initial encounter: Secondary | ICD-10-CM | POA: Diagnosis not present

## 2022-08-23 DIAGNOSIS — N99511 Cystostomy infection: Secondary | ICD-10-CM | POA: Insufficient documentation

## 2022-08-23 DIAGNOSIS — R279 Unspecified lack of coordination: Secondary | ICD-10-CM | POA: Diagnosis not present

## 2022-08-23 DIAGNOSIS — N390 Urinary tract infection, site not specified: Secondary | ICD-10-CM | POA: Insufficient documentation

## 2022-08-23 DIAGNOSIS — T83098A Other mechanical complication of other indwelling urethral catheter, initial encounter: Secondary | ICD-10-CM | POA: Diagnosis not present

## 2022-08-23 DIAGNOSIS — Z7401 Bed confinement status: Secondary | ICD-10-CM | POA: Diagnosis not present

## 2022-08-23 DIAGNOSIS — R9431 Abnormal electrocardiogram [ECG] [EKG]: Secondary | ICD-10-CM | POA: Diagnosis not present

## 2022-08-23 DIAGNOSIS — Z743 Need for continuous supervision: Secondary | ICD-10-CM | POA: Diagnosis not present

## 2022-08-23 DIAGNOSIS — T83010A Breakdown (mechanical) of cystostomy catheter, initial encounter: Secondary | ICD-10-CM

## 2022-08-23 DIAGNOSIS — T83198A Other mechanical complication of other urinary devices and implants, initial encounter: Secondary | ICD-10-CM | POA: Diagnosis not present

## 2022-08-23 DIAGNOSIS — R531 Weakness: Secondary | ICD-10-CM | POA: Diagnosis not present

## 2022-08-23 DIAGNOSIS — R5381 Other malaise: Secondary | ICD-10-CM | POA: Diagnosis not present

## 2022-08-23 LAB — URINALYSIS, ROUTINE W REFLEX MICROSCOPIC
Bilirubin Urine: NEGATIVE
Glucose, UA: NEGATIVE mg/dL
Ketones, ur: NEGATIVE mg/dL
Nitrite: POSITIVE — AB
Protein, ur: 100 mg/dL — AB
RBC / HPF: >50 RBC/hpf (ref 0–5)
Specific Gravity, Urine: 1.017 (ref 1.005–1.030)
WBC, UA: >50 WBC/hpf (ref 0–5)
pH: 7 (ref 5.0–8.0)

## 2022-08-23 MED ORDER — NITROFURANTOIN MONOHYD MACRO 100 MG PO CAPS: 100.0000 mg | ORAL_CAPSULE | Freq: Two times a day (BID) | ORAL | Status: DC

## 2022-08-23 MED ORDER — CEFDINIR 300 MG PO CAPS: 300.0000 mg | ORAL_CAPSULE | Freq: Two times a day (BID) | ORAL | 0 refills | Status: AC

## 2022-08-23 NOTE — ED Triage Notes (Signed)
Pt called EMS for suprapubic cath clogged for 2 days. Pt states she tried to call the urologist office and with no response. Pt denies pain at this time.

## 2022-08-23 NOTE — ED Provider Notes (Signed)
[place] EMERGENCY DEPARTMENT AT [facility] Provider Note   [name]: [id number] Arrival date & time: 08/23/22  1359     History  Chief Complaint  Patient presents with   clogged catheter    Melissa West is a 32 y.o. female.  History of MS, she has a suprapubic catheter due to neurogenic bladder.  Placed about a month ago but over the past 2 days has not been draining well.  Denies fevers or abdominal pain, no nausea or vomiting.  No other complaints.  Tried to call her urologist office but they did not answer the phone so she came to the ER.  She notes that her urine looks to be much more cloudy and her mother noted some sediment floating in her urine which is not normal for her.  HPI     Home Medications Prior to Admission medications   Medication Sig Start Date End Date Taking? Authorizing Provider  cefdinir (OMNICEF) 300 MG capsule Take 1 capsule (300 mg total) by mouth 2 (two) times daily for 7 days. 08/23/22 08/30/22 Yes Shayna Eblen A, PA-C  acetaminophen (TYLENOL) 325 MG tablet Take 2 tablets (650 mg total) by mouth every 6 (six) hours as needed for mild pain (or Fever >/= 101). 05/20/21   Shon Hale, MD  acetic acid 0.25 % irrigation Irrigate with as directed daily. Irrigate 60 daily 02/03/22   [name], Mardene Emmalia Heyboer, MD  AMBULATORY NON FORMULARY MEDICATION 30 mLs by Intracatheter route daily as needed. Medication Name: Sodium Chloride 0.9% 11/12/21   [name], Mardene Kashden Deboy, MD  AMBULATORY NON FORMULARY MEDICATION Medication Name: Bedside catheter bag 08/12/22   Bjorn Pippin, MD  doxycycline (VIBRAMYCIN) 100 MG capsule Take 1 capsule (100 mg total) by mouth 2 (two) times daily. 04/30/22   Smoot, Shawn Route, PA-C  ergocalciferol (VITAMIN D2) 1.25 MG (50000 UT) capsule Take 1 capsule (50,000 Units total) by mouth once a week. 11/07/21   Emokpae, Courage, MD  KESIMPTA 20 MG/0.4ML SOAJ Inject 20 mg into the skin every 30 (thirty) days. 06/24/21   [provider]   levofloxacin (LEVAQUIN) 500 MG tablet Take 500 mg by mouth daily. 03/17/22   [provider]  midodrine (PROAMATINE) 5 MG tablet Take 1 tablet (5 mg total) by mouth 3 (three) times daily with meals. To Help Prevent your Blood Pressure from dropping 11/07/21   Shon Hale, MD  nitrofurantoin, macrocrystal-monohydrate, (MACROBID) 100 MG capsule Take 1 capsule (100 mg total) by mouth 2 (two) times daily. 12/15/21   Peter Garter, PA  polyvinyl alcohol (LIQUIFILM TEARS) 1.4 % ophthalmic solution Place 1 drop into both eyes as needed for dry eyes. 06/20/21   Shon Hale, MD  senna-docusate (SENOKOT-S) 8.6-50 MG tablet Take 2 tablets by mouth at bedtime. 11/07/21 11/07/22  Shon Hale, MD  Syringe, Disposable, (50-60CC SYRINGE) 60 ML MISC Flush 30-50cc of sterile water into catheter daily as needed 01/15/22   [name], Mardene Tashiya Souders, MD  Water For Irrigation, Sterile (STERILE WATER FOR IRRIGATION) Irrigate with 50 mLs as directed daily. Irrigate catheter daily with 50cc of sterile water. 01/15/22   [name], Mardene Masaji Billups, MD      Allergies    Patient has no known allergies.    Review of Systems   Review of Systems  Physical Exam Updated Vital Signs BP 109/82   Pulse 77   Temp 97.7 F (36.5 C) (Oral)   Resp 16   Ht 5\' 2"  (1.575 m)   Wt 45.4 kg  LMP 08/09/2022 (Approximate)   SpO2 100%   BMI 18.29 kg/m  Physical Exam  ED Results / Procedures / Treatments   Labs (all labs ordered are listed, but only abnormal results are displayed) Labs Reviewed  URINALYSIS, ROUTINE W REFLEX MICROSCOPIC - Abnormal; Notable for the following components:      Result Value   APPearance CLOUDY (*)    Hgb urine dipstick LARGE (*)    Protein, ur 100 (*)    Nitrite POSITIVE (*)    Leukocytes,Ua LARGE (*)    Bacteria, UA RARE (*)    All other components within normal limits  URINE CULTURE    EKG EKG Interpretation  Date/Time:  Monday Aug 23 2022 15:10:15 EDT Ventricular Rate:  94 PR  Interval:  147 QRS Duration: 77 QT Interval:  345 QTC Calculation: 432 R Axis:   81 Text Interpretation: Sinus rhythm nonspecific t waves anterior Confirmed by Meridee Score [phone]) on 08/23/2022 3:17:23 PM  Radiology No results found.  Procedures BLADDER CATHETERIZATION  Date/Time: 08/23/2022 3:36 PM  Performed by: Ma Rings, PA-C Authorized by: Ma Rings, PA-C   Consent:    Consent obtained:  Verbal   Consent given by:  Patient   Risks, benefits, and alternatives were discussed: yes     Risks discussed:  False passage, infection, incomplete procedure and pain   Alternatives discussed:  Referral Universal protocol:    Patient identity confirmed:  Verbally with patient Pre-procedure details:    Procedure purpose:  Therapeutic   Preparation: Patient was prepped and draped in usual sterile fashion   Anesthesia:    Anesthesia method:  None Procedure details:    Procedure performed by provider due to: suprapubic catheter.   Catheter type:  Foley   Catheter size:  18 Fr   Number of attempts:  1   Urine characteristics:  Foul smelling and cloudy Post-procedure details:    Procedure completion:  Tolerated well, no immediate complications     Medications Ordered in ED Medications - No data to display  ED Course/ Medical Decision Making/ A&P Clinical Course as of 08/23/22 1635  Mon Aug 23, 2022  1621 Urinalysis, Routine w reflex microscopic -Urine, Suprapubic(!) [CB]    Clinical Course User Index [CB] Ma Rings, PA-C                             Medical Decision Making Ddx: UTI, foley catheter malfuction, other ED course: Patient has history of MS and was brought into the ER today for clogged Foley catheter x 2 days with unusually cloudy urine with sediment floating in the urine which is not normal for her.  Catheter was removed without difficulty and replaced by myself as noted in procedure note.  Very cloudy urine with large amount of  particulates noted.  Likely UTI as this is not normal for her.  Antibiotics per recent culture data and have her follow-up closely with urology.  Amount and/or Complexity of Data Reviewed Labs: ordered. Decision-making details documented in ED Course.  Risk Prescription drug management.           Final Clinical Impression(s) / ED Diagnoses Final diagnoses:  Suprapubic catheter dysfunction, initial encounter (HCC)  Urinary tract infection associated with cystostomy catheter, initial encounter (HCC)    Rx / DC Orders ED Discharge Orders          Ordered    cefdinir (OMNICEF) 300 MG capsule  2 times  daily        08/23/22 1622              Josem Kaufmann 08/23/22 1635    Terrilee Files, MD 08/24/22 [phone]

## 2022-08-23 NOTE — ED Notes (Signed)
Pt d/c instructions went over. Transportation called to take pt home.

## 2022-08-23 NOTE — Discharge Instructions (Signed)
Seen today because your catheter was no longer working.  Since your urine looks different than usual for you we will start you on antibiotics.  But you need to talk to your urologist.  They can follow-up on your culture in a couple of days to see if you need to continue the antibiotics.   Come back to the ER for new or worsening symptoms.

## 2022-08-25 ENCOUNTER — Encounter (HOSPITAL_COMMUNITY): Payer: Self-pay | Admitting: Emergency Medicine

## 2022-08-25 ENCOUNTER — Ambulatory Visit: Payer: 59

## 2022-08-25 ENCOUNTER — Emergency Department (HOSPITAL_COMMUNITY)
Admission: EM | Admit: 2022-08-25 | Discharge: 2022-08-25 | Disposition: A | Discharge status: Home / Self Care | Payer: 59 | Attending: Emergency Medicine | Admitting: Emergency Medicine

## 2022-08-25 DIAGNOSIS — R5381 Other malaise: Secondary | ICD-10-CM | POA: Diagnosis not present

## 2022-08-25 DIAGNOSIS — T83098A Other mechanical complication of other indwelling urethral catheter, initial encounter: Secondary | ICD-10-CM | POA: Insufficient documentation

## 2022-08-25 DIAGNOSIS — Z466 Encounter for fitting and adjustment of urinary device: Secondary | ICD-10-CM | POA: Diagnosis not present

## 2022-08-25 DIAGNOSIS — T83091D Other mechanical complication of indwelling urethral catheter, subsequent encounter: Secondary | ICD-10-CM | POA: Diagnosis not present

## 2022-08-25 DIAGNOSIS — Y69 Unspecified misadventure during surgical and medical care: Secondary | ICD-10-CM | POA: Diagnosis not present

## 2022-08-25 DIAGNOSIS — Z7401 Bed confinement status: Secondary | ICD-10-CM | POA: Diagnosis not present

## 2022-08-25 DIAGNOSIS — R69 Illness, unspecified: Secondary | ICD-10-CM | POA: Diagnosis not present

## 2022-08-25 DIAGNOSIS — Z743 Need for continuous supervision: Secondary | ICD-10-CM | POA: Diagnosis not present

## 2022-08-25 DIAGNOSIS — T83098D Other mechanical complication of other indwelling urethral catheter, subsequent encounter: Secondary | ICD-10-CM | POA: Diagnosis not present

## 2022-08-25 DIAGNOSIS — R531 Weakness: Secondary | ICD-10-CM | POA: Diagnosis not present

## 2022-08-25 NOTE — ED Notes (Signed)
Pt given crackers and something to drink 

## 2022-08-25 NOTE — ED Notes (Signed)
Bladder scanner revealed 49mL of urine in bladder, repeated scanner twice, MD made aware

## 2022-08-25 NOTE — ED Provider Notes (Signed)
[place] EMERGENCY DEPARTMENT AT [facility] Provider Note   [name]: [id number] Arrival date & time: 08/25/22  1336     History {Add pertinent medical, surgical, social history, OB history to HPI:1} Chief Complaint  Patient presents with   Urinary Retention    Melissa West is a 32 y.o. female.  32 year old female with history of MS status post suprapubic catheter who presented to the emergency department with difficulty with her suprapubic catheter.  Was in the emergency department yesterday and had her suprapubic catheter exchanged because it was reportedly obstructed.  Says that today her nursing aides attempted to flush her Foley catheter as they have been instructed to do with acetic acid and that they had difficulty flushing it so she came to the emergency department.  Denies any abdominal pain or the urge to void at this time.  No flank pain or fevers recently.       Home Medications Prior to Admission medications   Medication Sig Start Date End Date Taking? Authorizing Provider  acetaminophen (TYLENOL) 325 MG tablet Take 2 tablets (650 mg total) by mouth every 6 (six) hours as needed for mild pain (or Fever >/= 101). 05/20/21   Shon Hale, MD  acetic acid 0.25 % irrigation Irrigate with as directed daily. Irrigate 60 daily 02/03/22   [name], Mardene Celeste, MD  AMBULATORY NON FORMULARY MEDICATION 30 mLs by Intracatheter route daily as needed. Medication Name: Sodium Chloride 0.9% 11/12/21   [name], Mardene Celeste, MD  AMBULATORY NON FORMULARY MEDICATION Medication Name: Bedside catheter bag 08/12/22   Bjorn Pippin, MD  cefdinir (OMNICEF) 300 MG capsule Take 1 capsule (300 mg total) by mouth 2 (two) times daily for 7 days. 08/23/22 08/30/22  Carmel Sacramento A, PA-C  doxycycline (VIBRAMYCIN) 100 MG capsule Take 1 capsule (100 mg total) by mouth 2 (two) times daily. 04/30/22   Smoot, Shawn Route, PA-C  ergocalciferol (VITAMIN D2) 1.25 MG (50000 UT) capsule Take 1 capsule (50,000  Units total) by mouth once a week. 11/07/21   Emokpae, Courage, MD  KESIMPTA 20 MG/0.4ML SOAJ Inject 20 mg into the skin every 30 (thirty) days. 06/24/21   [provider]  levofloxacin (LEVAQUIN) 500 MG tablet Take 500 mg by mouth daily. 03/17/22   [provider]  midodrine (PROAMATINE) 5 MG tablet Take 1 tablet (5 mg total) by mouth 3 (three) times daily with meals. To Help Prevent your Blood Pressure from dropping 11/07/21   Shon Hale, MD  nitrofurantoin, macrocrystal-monohydrate, (MACROBID) 100 MG capsule Take 1 capsule (100 mg total) by mouth 2 (two) times daily. 12/15/21   Peter Garter, PA  polyvinyl alcohol (LIQUIFILM TEARS) 1.4 % ophthalmic solution Place 1 drop into both eyes as needed for dry eyes. 06/20/21   Shon Hale, MD  senna-docusate (SENOKOT-S) 8.6-50 MG tablet Take 2 tablets by mouth at bedtime. 11/07/21 11/07/22  Shon Hale, MD  Syringe, Disposable, (50-60CC SYRINGE) 60 ML MISC Flush 30-50cc of sterile water into catheter daily as needed 01/15/22   [name], Mardene Celeste, MD  Water For Irrigation, Sterile (STERILE WATER FOR IRRIGATION) Irrigate with 50 mLs as directed daily. Irrigate catheter daily with 50cc of sterile water. 01/15/22   [name], Mardene Celeste, MD      Allergies    Patient has no known allergies.    Review of Systems   Review of Systems  Physical Exam Updated Vital Signs BP 91/61   Pulse (!) 59   Temp 98.4 F (36.9 C) (Oral)  Resp 19   LMP 08/09/2022 (Approximate)   SpO2 96%  Physical Exam  ED Results / Procedures / Treatments   Labs (all labs ordered are listed, but only abnormal results are displayed) Labs Reviewed - No data to display  EKG None  Radiology No results found.  Procedures Procedures  {Document cardiac monitor, telemetry assessment procedure when appropriate:1}  Medications Ordered in ED Medications - No data to display  ED Course/ Medical Decision Making/ A&P   {   Click here for ABCD2,  HEART and other calculatorsREFRESH Note before signing :1}                          Medical Decision Making  ***  {Document critical care time when appropriate:1} {Document review of labs and clinical decision tools ie heart score, Chads2Vasc2 etc:1}  {Document your independent review of radiology images, and any outside records:1} {Document your discussion with family members, caretakers, and with consultants:1} {Document social determinants of health affecting pt's care:1} {Document your decision making why or why not admission, treatments were needed:1} Final Clinical Impression(s) / ED Diagnoses Final diagnoses:  None    Rx / DC Orders ED Discharge Orders     None

## 2022-08-25 NOTE — Discharge Instructions (Signed)
You were seen for urinary retention in the emergency department.   At home, please continue your foley care as directed by the urologists.    Check your MyChart online for the results of any tests that had not resulted by the time you left the emergency department.   Follow-up with your primary doctor in 2-3 days regarding your visit.    Return immediately to the emergency department if you experience any of the following: Urinary retention, flank pain, fever, or any other concerning symptoms.    Thank you for visiting our Emergency Department. It was a pleasure taking care of you today.

## 2022-08-25 NOTE — ED Triage Notes (Signed)
Pt arrived via [facility] from home c/o foley catheter bag not draining properly. States she was here for the same problem yesterday and had her foley replaced here yesterday, but this one is not draining urine either

## 2022-08-25 NOTE — ED Notes (Signed)
Irrigated suprapubic catheter with 30mL of NaCl per verbal given by MD, was able to irrigate 30mL of saline and pull back 30mL of NaCl

## 2022-08-25 NOTE — ED Notes (Signed)
Pt on the list to go back home by EMS

## 2022-08-26 ENCOUNTER — Emergency Department (HOSPITAL_COMMUNITY)
Admission: EM | Admit: 2022-08-26 | Discharge: 2022-08-26 | Disposition: A | Discharge status: Home / Self Care | Payer: 59 | Attending: Emergency Medicine | Admitting: Emergency Medicine

## 2022-08-26 ENCOUNTER — Telehealth: Payer: Self-pay

## 2022-08-26 ENCOUNTER — Encounter (HOSPITAL_COMMUNITY): Payer: Self-pay

## 2022-08-26 ENCOUNTER — Other Ambulatory Visit: Payer: Self-pay

## 2022-08-26 DIAGNOSIS — R5381 Other malaise: Secondary | ICD-10-CM | POA: Diagnosis not present

## 2022-08-26 DIAGNOSIS — T83098A Other mechanical complication of other indwelling urethral catheter, initial encounter: Secondary | ICD-10-CM | POA: Diagnosis not present

## 2022-08-26 DIAGNOSIS — R339 Retention of urine, unspecified: Secondary | ICD-10-CM | POA: Diagnosis not present

## 2022-08-26 DIAGNOSIS — T849XXA Unspecified complication of internal orthopedic prosthetic device, implant and graft, initial encounter: Secondary | ICD-10-CM | POA: Diagnosis not present

## 2022-08-26 DIAGNOSIS — T83091A Other mechanical complication of indwelling urethral catheter, initial encounter: Secondary | ICD-10-CM | POA: Diagnosis not present

## 2022-08-26 DIAGNOSIS — Z743 Need for continuous supervision: Secondary | ICD-10-CM | POA: Diagnosis not present

## 2022-08-26 LAB — URINALYSIS, ROUTINE W REFLEX MICROSCOPIC
Bilirubin Urine: NEGATIVE
Glucose, UA: NEGATIVE mg/dL
Ketones, ur: NEGATIVE mg/dL
Nitrite: POSITIVE — AB
Protein, ur: 30 mg/dL — AB
Specific Gravity, Urine: 1.011 (ref 1.005–1.030)
pH: 7 (ref 5.0–8.0)

## 2022-08-26 LAB — URINE CULTURE: Culture: 80000 — AB

## 2022-08-26 LAB — PREGNANCY, URINE: Preg Test, Ur: NEGATIVE

## 2022-08-26 NOTE — Telephone Encounter (Signed)
Patient's mother stopped by office to let the office know patient could not come to recent visits due to no transportation.  She will have patient taken to emergency room.  She stated she wasn't sure what the emergency room was doing to care for patient's catheter being clogged up.  She stated she could not go with patient to emergency visits. She will continue to have patient to go to emergency room.

## 2022-08-26 NOTE — Discharge Instructions (Addendum)
Evaluation today revealed that your Foley catheter was clogged.  New Foley catheter placed and procedure well.  Advised to follow-up with PCP.  If you have new flank or abdominal pain, urine is cloudy, you develop fever or any other concerning symptom please return emerged part for further evaluation.

## 2022-08-26 NOTE — ED Notes (Signed)
Received report from [employer]. Pt resting. Nad. EMS called to take pt back home.

## 2022-08-26 NOTE — ED Notes (Signed)
Blood work delayed due to patient refusal

## 2022-08-26 NOTE — ED Triage Notes (Signed)
Pt arrived via [facility] from home c/o foley catheter bag not draining properly. States she was here for the same problem 2 days ago and had her foley replaced, pt also seen here last night for the same and discharged. Pt here for the same issue today.

## 2022-08-26 NOTE — Telephone Encounter (Signed)
Patient called to state that her SP tube is not draining.  She is wetting the bed at night and little is draining in her catheter bag.  She is scheduled for a NV on 05/20 for a catheter check and irrigation.  She has been to the hospital several times and had her catheter changed and is still having issues.  Does she need to follow up with you or NP soon?

## 2022-08-26 NOTE — ED Provider Notes (Signed)
[place] EMERGENCY DEPARTMENT AT [facility] Provider Note   [name]: [id number] Arrival date & time: 08/26/22  1614     History  Chief Complaint  Patient presents with   Urinary Retention   HPI Melissa West is a 32 y.o. female with MS status post indwelling suprapubic catheter presenting for urinary retention.  States this morning she noticed there was hardly any urinary output in her bag.  Denies suprapubic or abdominal tenderness.  Denies fever or chills.  States she was here yesterday and her catheter was flushed and she was discharged. Also 2 days prior was seen for same and Foley was exchanged at that time. Denies hematuria. Denies fever and chills. States her mother attempted to flush her Foley this morning and was unable to do so. States last bowel movement was today and she has had no issues with constipation her last week.  HPI     Home Medications Prior to Admission medications   Medication Sig Start Date End Date Taking? Authorizing Provider  acetaminophen (TYLENOL) 325 MG tablet Take 2 tablets (650 mg total) by mouth every 6 (six) hours as needed for mild pain (or Fever >/= 101). 05/20/21   Shon Hale, MD  acetic acid 0.25 % irrigation Irrigate with as directed daily. Irrigate 60 daily 02/03/22   [name], Mardene Celeste, MD  AMBULATORY NON FORMULARY MEDICATION 30 mLs by Intracatheter route daily as needed. Medication Name: Sodium Chloride 0.9% 11/12/21   [name], Mardene Celeste, MD  AMBULATORY NON FORMULARY MEDICATION Medication Name: Bedside catheter bag 08/12/22   Bjorn Pippin, MD  cefdinir (OMNICEF) 300 MG capsule Take 1 capsule (300 mg total) by mouth 2 (two) times daily for 7 days. 08/23/22 08/30/22  Carmel Sacramento A, PA-C  doxycycline (VIBRAMYCIN) 100 MG capsule Take 1 capsule (100 mg total) by mouth 2 (two) times daily. Patient not taking: Reported on 08/25/2022 04/30/22   Smoot, Shawn Route, PA-C  ergocalciferol (VITAMIN D2) 1.25 MG (50000 UT) capsule Take 1 capsule  (50,000 Units total) by mouth once a week. Patient not taking: Reported on 08/25/2022 11/07/21   Shon Hale, MD  KESIMPTA 20 MG/0.4ML SOAJ Inject 20 mg into the skin every 30 (thirty) days. 06/24/21   [provider]  Syringe, Disposable, (50-60CC SYRINGE) 60 ML MISC Flush 30-50cc of sterile water into catheter daily as needed 01/15/22   [name], Mardene Celeste, MD  Water For Irrigation, Sterile (STERILE WATER FOR IRRIGATION) Irrigate with 50 mLs as directed daily. Irrigate catheter daily with 50cc of sterile water. 01/15/22   [name], Mardene Celeste, MD      Allergies    Patient has no known allergies.    Review of Systems   Review of Systems  Genitourinary:  Positive for decreased urine volume.    Physical Exam  There were no vitals filed for this visit.   CONSTITUTIONAL:  well-appearing, NAD NEURO:  Alert and oriented x 3, CN 3-12 grossly intact EYES:  eyes equal and reactive ENT/NECK:  Supple, no stridor  CARDIO:  regular rate and rhythm, appears well-perfused  PULM:  No respiratory distress GI/GU:  non-distended, soft, catheter insertion site without oozing, notable clear yellowish tinged urine noted in the bag, approximately 30 mL.  No abdominal tenderness, no CVA tenderness. MSK/SPINE:  No gross deformities, no edema, moves all extremities SKIN:  no rash, atraumatic  *Additional and/or pertinent findings included in MDM below  ED Results / Procedures / Treatments   Labs (all labs ordered are listed, but only abnormal  results are displayed) Labs Reviewed  URINALYSIS, ROUTINE W REFLEX MICROSCOPIC  PREGNANCY, URINE    EKG None  Radiology No results found.  Procedures BLADDER CATHETERIZATION  Date/Time: 08/26/2022 6:46 PM  Performed by: Gareth Eagle, PA-C Authorized by: Gareth Eagle, PA-C   Consent:    Consent obtained:  Verbal   Consent given by:  Patient   Risks discussed:  Infection   Alternatives discussed:  No treatment Universal protocol:     Procedure explained and questions answered to patient or proxy's satisfaction: yes     Relevant documents present and verified: yes     Patient identity confirmed:  Verbally with patient and arm band Pre-procedure details:    Procedure purpose:  Therapeutic Anesthesia:    Anesthesia method:  None Procedure details:    Provider performed due to:  Complicated insertion   Catheter insertion:  Indwelling   Catheter type:  Foley   Catheter size:  16 Fr   Bladder irrigation: no     Number of attempts:  1   Urine characteristics:  Yellow and clear Post-procedure details:    Procedure completion:  Tolerated well, no immediate complications     Medications Ordered in ED Medications - No data to display  ED Course/ Medical Decision Making/ A&P Clinical Course as of 08/26/22 1852  Thu Aug 26, 2022  1733 Bladder scanned: 81 mL [JR]  1756 Patient refusing blood work stating that "kidneys are fine" [JR]    Clinical Course User Index [JR] Vaughan Browner                             Medical Decision Making  32 year old female who is well-appearing presenting for urinary retention.  Exam was unremarkable.  DDx includes Foley catheter obstruction, kidney infection or failure, UTI.  Patient refused labs.  At this point, attempted to flush the Foley.  Nurse stated that she was able to advance any fluid into the catheter.  At this point, discontinue the catheter.  Noticed that the proximal end of the catheter appeared to be clogged. new catheter insertion procedure went well without complication.  After insertion, immediately voided into the bag.  Urine appeared yellow and clear.  Suspect that UTI is improving.  Advised patient to continue taking her cefdinir as prescribed few days ago.  Considered renal pathology but unlikely given no tenderness, no fever.  Discussed pertinent return precautions.  Vital stable at discharge.  Advised to follow-up with her PCP.        Final Clinical  Impression(s) / ED Diagnoses Final diagnoses:  Obstruction of Foley catheter, initial encounter [facility])    Rx / DC Orders ED Discharge Orders     None         Gareth Eagle, PA-C 08/26/22 1853    Eber Hong, MD 08/28/22 [id number]

## 2022-08-27 ENCOUNTER — Telehealth (HOSPITAL_BASED_OUTPATIENT_CLINIC_OR_DEPARTMENT_OTHER): Payer: Self-pay | Admitting: Emergency Medicine

## 2022-08-27 NOTE — Progress Notes (Signed)
ED Antimicrobial Stewardship Positive Culture Follow Up   Melissa West is an 32 y.o. female who presented to [facility] on 08/26/2022 with a chief complaint of urinary retention in setting of obstructed suprapubic catheter. Patient was reporting no other urinary symptoms or systemic symptoms of infection.    Recent Results (from the past 720 hour(s))  Urine Culture     Status: Abnormal   Collection Time: 08/23/22  3:40 PM   Specimen: Urine, Suprapubic  Result Value Ref Range Status   Specimen Description   Final    URINE, SUPRAPUBIC Performed at [facility], [address], [place], [place] [address]    Special Requests   Final    NONE Performed at [facility], [address] [place], [place] [address]    Culture (A)  Final    80,000 COLONIES/mL METHICILLIN RESISTANT STAPHYLOCOCCUS AUREUS 80,000 COLONIES/mL PROVIDENCIA STUARTII    Report Status 08/26/2022 FINAL  Final   Organism ID, Bacteria METHICILLIN RESISTANT STAPHYLOCOCCUS AUREUS (A)  Final   Organism ID, Bacteria PROVIDENCIA STUARTII (A)  Final      Susceptibility   Methicillin resistant staphylococcus aureus - MIC*    CIPROFLOXACIN >=8 RESISTANT Resistant     GENTAMICIN <=0.5 SENSITIVE Sensitive     NITROFURANTOIN <=16 SENSITIVE Sensitive     OXACILLIN >=4 RESISTANT Resistant     TETRACYCLINE <=1 SENSITIVE Sensitive     VANCOMYCIN <=0.5 SENSITIVE Sensitive     TRIMETH/SULFA >=320 RESISTANT Resistant     CLINDAMYCIN <=0.25 SENSITIVE Sensitive     RIFAMPIN <=0.5 SENSITIVE Sensitive     Inducible Clindamycin NEGATIVE Sensitive     LINEZOLID 2 SENSITIVE Sensitive     * 80,000 COLONIES/mL METHICILLIN RESISTANT STAPHYLOCOCCUS AUREUS   Providencia stuartii - MIC*    AMPICILLIN RESISTANT Resistant     CEFEPIME <=0.12 SENSITIVE Sensitive     CEFTRIAXONE <=0.25 SENSITIVE Sensitive     CIPROFLOXACIN <=0.25 SENSITIVE Sensitive     GENTAMICIN RESISTANT Resistant     IMIPENEM 0.5 SENSITIVE Sensitive     NITROFURANTOIN  128 RESISTANT Resistant     TRIMETH/SULFA <=20 SENSITIVE Sensitive     AMPICILLIN/SULBACTAM 16 INTERMEDIATE Intermediate     PIP/TAZO <=4 SENSITIVE Sensitive     * 80,000 COLONIES/mL PROVIDENCIA STUARTII    [x]  Treated with cefdinir, organism resistant to prescribed antimicrobial  New antibiotic prescription: Call patient about urinary symptoms (not difficulty flushing catheter). If urinary symptoms are present, advise patient to return to ED for treatment. If symptoms are not present, discontinue cefdinir.   ED Provider: Meridee Score, MD   Jani Gravel, PharmD PGY-2 Infectious Diseases Resident  08/27/2022 9:16 AM

## 2022-08-27 NOTE — Telephone Encounter (Signed)
Post ED Visit - Positive Culture Follow-up: Unsuccessful Patient Follow-up  Culture assessed and recommendations reviewed by:  []  Enzo Bi, Pharm.D. []  Celedonio Miyamoto, Pharm.D., BCPS AQ-ID []  Garvin Fila, Pharm.D., BCPS []  Georgina Pillion, Pharm.D., BCPS []  [place], [address].D., BCPS, AAHIVP []  Estella Husk, Pharm.D., BCPS, AAHIVP [x]  Jani Gravel, PharmD []  Pollyann Samples, PharmD, BCPS  Positive urine culture  []  Patient discharged without antimicrobial prescription and treatment is now indicated []  Organism is resistant to prescribed ED discharge antimicrobial []  Patient with positive blood cultures   Unable to contact patient by phone, letter will be sent to address on file.  Plan: Call patient about urinary symptoms. If symptoms are present advise patient to return to ED.  If no symptoms, stop Cefdinir.  Pamala Hurry 08/27/2022, 4:52 PM

## 2022-08-30 ENCOUNTER — Ambulatory Visit: Payer: 59

## 2022-09-01 NOTE — Telephone Encounter (Signed)
Verbal from Dr. Ronne Binning to have patient come in to upsize her catheter.  I tried to call patient with no answer and no way to leave a voicemail.

## 2022-09-03 DIAGNOSIS — G35 Multiple sclerosis: Secondary | ICD-10-CM | POA: Diagnosis not present

## 2022-09-08 DIAGNOSIS — R69 Illness, unspecified: Secondary | ICD-10-CM | POA: Diagnosis not present

## 2022-09-09 ENCOUNTER — Ambulatory Visit: Payer: 59

## 2022-09-13 ENCOUNTER — Emergency Department (HOSPITAL_COMMUNITY)
Admission: EM | Admit: 2022-09-13 | Discharge: 2022-09-13 | Disposition: A | Discharge status: Home / Self Care | Payer: 59 | Attending: Emergency Medicine | Admitting: Emergency Medicine

## 2022-09-13 ENCOUNTER — Encounter (HOSPITAL_COMMUNITY): Payer: Self-pay

## 2022-09-13 ENCOUNTER — Other Ambulatory Visit: Payer: Self-pay

## 2022-09-13 ENCOUNTER — Telehealth: Payer: Self-pay

## 2022-09-13 DIAGNOSIS — T83098A Other mechanical complication of other indwelling urethral catheter, initial encounter: Secondary | ICD-10-CM | POA: Diagnosis not present

## 2022-09-13 DIAGNOSIS — Y732 Prosthetic and other implants, materials and accessory gastroenterology and urology devices associated with adverse incidents: Secondary | ICD-10-CM | POA: Insufficient documentation

## 2022-09-13 DIAGNOSIS — T83091A Other mechanical complication of indwelling urethral catheter, initial encounter: Secondary | ICD-10-CM | POA: Diagnosis not present

## 2022-09-13 DIAGNOSIS — Z743 Need for continuous supervision: Secondary | ICD-10-CM | POA: Diagnosis not present

## 2022-09-13 DIAGNOSIS — T83010A Breakdown (mechanical) of cystostomy catheter, initial encounter: Secondary | ICD-10-CM

## 2022-09-13 DIAGNOSIS — R6889 Other general symptoms and signs: Secondary | ICD-10-CM | POA: Diagnosis not present

## 2022-09-13 DIAGNOSIS — Z7401 Bed confinement status: Secondary | ICD-10-CM | POA: Diagnosis not present

## 2022-09-13 DIAGNOSIS — R5381 Other malaise: Secondary | ICD-10-CM | POA: Diagnosis not present

## 2022-09-13 DIAGNOSIS — I499 Cardiac arrhythmia, unspecified: Secondary | ICD-10-CM | POA: Diagnosis not present

## 2022-09-13 DIAGNOSIS — T83198A Other mechanical complication of other urinary devices and implants, initial encounter: Secondary | ICD-10-CM | POA: Diagnosis not present

## 2022-09-13 NOTE — ED Triage Notes (Signed)
Pt reports her suprapubic catheter is not draining.

## 2022-09-13 NOTE — Telephone Encounter (Signed)
Patient called to request an apt, her SP tube is clogging up.  She went to the ER and they put a 19f catheter in.  Patient should have a 5f SP cath per Dr. Dimas Millin previous recommendation documented on 04/21/2022.  Patient offered and apt today for a NV or Wednesday.  She will call back to confirm apt.  Patient aware that if she still has issues with the 10f cath clogging after it is placed Dr. Ronne Binning recommends up sizing her catheter from and 95f to a 57f.  See documentation on 05/22.

## 2022-09-13 NOTE — ED Provider Notes (Signed)
[place] EMERGENCY DEPARTMENT AT [facility] Provider Note   [name]: [id number] Arrival date & time: 09/13/22  1206     History  Chief Complaint  Patient presents with   supra pubic cath problem    Melissa West is a 32 y.o. female.  Presents the ER today because she noticed her suprapubic catheter was leaking around the catheter, not draining into her bag this morning. 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  She was seen for this on 08/23/2022 by myself, diagnosed with UTI.   HPI     Home Medications Prior to Admission medications   Medication Sig Start Date End Date Taking? Authorizing Provider  acetaminophen (TYLENOL) 325 MG tablet Take 2 tablets (650 mg total) by mouth every 6 (six) hours as needed for mild pain (or Fever >/= 101). 05/20/21   Shon Hale, MD  acetic acid 0.25 % irrigation Irrigate with as directed daily. Irrigate 60 daily 02/03/22   [name], Mardene , MD  AMBULATORY NON FORMULARY MEDICATION 30 mLs by Intracatheter route daily as needed. Medication Name: Sodium Chloride 0.9% 11/12/21   [name], Mardene , MD  AMBULATORY NON FORMULARY MEDICATION Medication Name: Bedside catheter bag 08/12/22   Bjorn Pippin, MD  doxycycline (VIBRAMYCIN) 100 MG capsule Take 1 capsule (100 mg total) by mouth 2 (two) times daily. Patient not taking: Reported on 08/25/2022 04/30/22   Smoot, Shawn Route, PA-C  ergocalciferol (VITAMIN D2) 1.25 MG (50000 UT) capsule Take 1 capsule (50,000 Units total) by mouth once a week. Patient not taking: Reported on 08/25/2022 11/07/21   Shon Hale, MD  KESIMPTA 20 MG/0.4ML SOAJ Inject 20 mg into the skin every 30 (thirty) days. 06/24/21   [provider]  Syringe, Disposable, (50-60CC SYRINGE) 60 ML MISC Flush 30-50cc of sterile water into catheter daily as needed 01/15/22   [name], Mardene , MD  Water For Irrigation, Sterile (STERILE WATER FOR IRRIGATION) Irrigate with 50 mLs as directed daily.  Irrigate catheter daily with 50cc of sterile water. 01/15/22   [name], Mardene , MD      Allergies    Patient has no known allergies.    Review of Systems   Review of Systems  Physical Exam Updated Vital Signs BP 109/70 (BP Location: Right Arm)   Pulse 92   Temp 98.1 F (36.7 C) (Oral)   Resp 16   Ht 5\' 2"  (1.575 m)   Wt 45.4 kg   LMP 09/06/2022 (Approximate)   SpO2 100%   BMI 18.31 kg/m  Physical Exam Vitals and nursing note reviewed.  Constitutional:      General: She is not in acute distress.    Appearance: She is well-developed.  HENT:     Head: Normocephalic and atraumatic.     Mouth/Throat:     Mouth: Mucous membranes are moist.  Eyes:     Conjunctiva/sclera: Conjunctivae normal.  Cardiovascular:     Rate and Rhythm: Normal rate and regular rhythm.     Heart sounds: No murmur heard. Pulmonary:     Effort: Pulmonary effort is normal. No respiratory distress.     Breath sounds: Normal breath sounds.  Abdominal:     Palpations: Abdomen is soft.     Tenderness: There is no abdominal tenderness.  Genitourinary:    Comments: SP cath in place, not draining urine, site is not irritated, no tenderness Musculoskeletal:        General: No swelling.     Cervical back: Neck supple.  Skin:  General: Skin is warm and dry.     Capillary Refill: Capillary refill takes less than 2 seconds.  Neurological:     General: No focal deficit present.     Mental Status: She is alert and oriented to person, place, and time.  Psychiatric:        Mood and Affect: Mood normal.        Behavior: Behavior normal.     ED Results / Procedures / Treatments   Labs (all labs ordered are listed, but only abnormal results are displayed) Labs Reviewed - No data to display  EKG None  Radiology No results found.  Procedures BLADDER CATHETERIZATION  Date/Time: 09/13/2022 3:45 PM  Performed by: Ma Rings, PA-C Authorized by: Ma Rings, PA-C   Consent:     Consent obtained:  Verbal   Consent given by:  Patient   Risks, benefits, and alternatives were discussed: yes     Risks discussed:  False passage, infection and pain   Alternatives discussed:  Referral Universal protocol:    Procedure explained and questions answered to patient or proxy's satisfaction: yes     Patient identity confirmed:  Verbally with patient Pre-procedure details:    Procedure purpose:  Therapeutic   Preparation: Patient was prepped and draped in usual sterile fashion   Anesthesia:    Anesthesia method:  None Procedure details:    Procedure performed by provider due to: suprapubic foley.   Catheter insertion:  Indwelling   Catheter type:  Foley   Catheter size:  18 Fr   Bladder irrigation: no     Number of attempts:  1   Urine characteristics:  Clear Post-procedure details:    Procedure completion:  Tolerated     Medications Ordered in ED Medications - No data to display  ED Course/ Medical Decision Making/ A&P                             Medical Decision Making Parental diagnosis: Clogged Foley catheter, UTI, pyelonephritis, other Course: Patient has been having issues with her Foley catheter clogging, send multiple visits to the ED, presented that she is negative UTI, subsequently completed a course of antibiotics, states her urine is now clear she is having no abdominal pain nausea vomiting, no flank pain, no fevers or chills, does not feel she is UTI.  She feels that the smaller catheter that ws inserted was the root of the problem.  Placed with an 18 French catheter at her request, she tolerated this well, there is no difficulty or resistance with insertion.  Clear urine return.  Do not feel she has UTI, no indication for any further testing this time, patient is agreeable with this, she already has an appointment with urology scheduled for June 21.  Patient was given strict return precautions.           Final Clinical Impression(s) / ED  Diagnoses Final diagnoses:  Suprapubic catheter dysfunction, initial encounter [facility]    Rx / DC Orders ED Discharge Orders     None         Josem Kaufmann 09/13/22 1551    Bethann Berkshire, MD 09/16/22 1009

## 2022-09-13 NOTE — Discharge Instructions (Signed)
Please make sure you follow-up with your urologist as scheduled.  Come back to the ER if you have any new or worsening symptoms.  We replaced your catheter with an 20 [place], hoping to prevent further clogging/leaking.  If you develop fever, abdominal pain, flank pain, or any other new or worsening symptoms come back to the ER.

## 2022-09-15 ENCOUNTER — Encounter (HOSPITAL_COMMUNITY): Payer: Self-pay | Admitting: Emergency Medicine

## 2022-09-15 ENCOUNTER — Other Ambulatory Visit: Payer: Self-pay

## 2022-09-15 ENCOUNTER — Emergency Department (HOSPITAL_COMMUNITY)
Admission: EM | Admit: 2022-09-15 | Discharge: 2022-09-15 | Disposition: A | Discharge status: Home / Self Care | Payer: 59 | Attending: Emergency Medicine | Admitting: Emergency Medicine

## 2022-09-15 DIAGNOSIS — H0289 Other specified disorders of eyelid: Secondary | ICD-10-CM | POA: Diagnosis present

## 2022-09-15 DIAGNOSIS — H00014 Hordeolum externum left upper eyelid: Secondary | ICD-10-CM | POA: Diagnosis not present

## 2022-09-15 DIAGNOSIS — H00015 Hordeolum externum left lower eyelid: Secondary | ICD-10-CM

## 2022-09-15 DIAGNOSIS — R5381 Other malaise: Secondary | ICD-10-CM | POA: Diagnosis not present

## 2022-09-15 DIAGNOSIS — Z743 Need for continuous supervision: Secondary | ICD-10-CM | POA: Diagnosis not present

## 2022-09-15 DIAGNOSIS — H579 Unspecified disorder of eye and adnexa: Secondary | ICD-10-CM | POA: Diagnosis not present

## 2022-09-15 MED ORDER — CEPHALEXIN 500 MG PO CAPS
500.0000 mg | ORAL_CAPSULE | Freq: Once | ORAL | Status: AC
  Administered 2022-09-15: 500 mg via ORAL
  Filled 2022-09-15: qty 1

## 2022-09-15 MED ORDER — CEPHALEXIN 500 MG PO CAPS: 500.0000 mg | ORAL_CAPSULE | Freq: Three times a day (TID) | ORAL | 0 refills | Status: AC

## 2022-09-15 NOTE — ED Provider Notes (Signed)
[place] EMERGENCY DEPARTMENT AT [facility] Provider Note   [name]: [id number] Arrival date & time: 09/15/22  1738     History  Chief Complaint  Patient presents with   Eye Problem    Yoshino Sneary is a 32 y.o. female.  32 year old female presents today for pain and swelling to the left lower eyelid.  She states that started this morning.  Has minimal drainage.  No vision change, or pain with EOMs.  No fever.  The history is provided by the patient. No language interpreter was used.       Home Medications Prior to Admission medications   Medication Sig Start Date End Date Taking? Authorizing Provider  acetaminophen (TYLENOL) 325 MG tablet Take 2 tablets (650 mg total) by mouth every 6 (six) hours as needed for mild pain (or Fever >/= 101). 05/20/21   Shon Hale, MD  acetic acid 0.25 % irrigation Irrigate with as directed daily. Irrigate 60 daily 02/03/22   [name], Mardene Celeste, MD  AMBULATORY NON FORMULARY MEDICATION 30 mLs by Intracatheter route daily as needed. Medication Name: Sodium Chloride 0.9% 11/12/21   [name], Mardene Celeste, MD  AMBULATORY NON FORMULARY MEDICATION Medication Name: Bedside catheter bag 08/12/22   Bjorn Pippin, MD  doxycycline (VIBRAMYCIN) 100 MG capsule Take 1 capsule (100 mg total) by mouth 2 (two) times daily. Patient not taking: Reported on 08/25/2022 04/30/22   Smoot, Shawn Route, PA-C  ergocalciferol (VITAMIN D2) 1.25 MG (50000 UT) capsule Take 1 capsule (50,000 Units total) by mouth once a week. Patient not taking: Reported on 08/25/2022 11/07/21   Shon Hale, MD  KESIMPTA 20 MG/0.4ML SOAJ Inject 20 mg into the skin every 30 (thirty) days. 06/24/21   [provider]  Syringe, Disposable, (50-60CC SYRINGE) 60 ML MISC Flush 30-50cc of sterile water into catheter daily as needed 01/15/22   [name], Mardene Celeste, MD  Water For Irrigation, Sterile (STERILE WATER FOR IRRIGATION) Irrigate with 50 mLs as directed daily. Irrigate catheter daily  with 50cc of sterile water. 01/15/22   [name], Mardene Celeste, MD      Allergies    Patient has no known allergies.    Review of Systems   Review of Systems  Constitutional:  Negative for fever.  Eyes:  Negative for photophobia, pain, discharge, redness, itching and visual disturbance.  All other systems reviewed and are negative.   Physical Exam Updated Vital Signs BP 91/67   Pulse 99   Temp 98.2 F (36.8 C) (Oral)   Resp 20   Ht 5\' 2"  (1.575 m)   Wt 45 kg   LMP 09/06/2022 (Approximate)   SpO2 100%   BMI 18.15 kg/m  Physical Exam Vitals and nursing note reviewed.  Constitutional:      General: She is not in acute distress.    Appearance: Normal appearance. She is not ill-appearing.  HENT:     Head: Normocephalic and atraumatic.     Nose: Nose normal.  Eyes:     Conjunctiva/sclera: Conjunctivae normal.     Comments: Stye to left lower eyelid.  Minimal drainage.  Pulmonary:     Effort: Pulmonary effort is normal. No respiratory distress.  Musculoskeletal:        General: No deformity.  Skin:    Findings: No rash.  Neurological:     Mental Status: She is alert.     ED Results / Procedures / Treatments   Labs (all labs ordered are listed, but only abnormal results are displayed) Labs Reviewed -  No data to display  EKG None  Radiology No results found.  Procedures Procedures    Medications Ordered in ED Medications - No data to display  ED Course/ Medical Decision Making/ A&P                             Medical Decision Making  32 year old female presents today with what is consistent with a stye to left lower lid.  Without other complaints.  Discussed symptomatic management.  Will prescribe Keflex.  Initial dose given in the emergency department.  Additional sent to patient's choice of pharmacy.  Return precaution discussed.  Discussed follow-up with PCP.  Patient is appropriate for discharge.  Discharged in stable condition.  Final Clinical  Impression(s) / ED Diagnoses Final diagnoses:  Hordeolum externum of left lower eyelid    Rx / DC Orders ED Discharge Orders          Ordered    cephALEXin (KEFLEX) 500 MG capsule  3 times daily        09/15/22 1907              Marita Kansas, PA-C 09/15/22 1907    Vanetta Mulders, MD 09/16/22 2117

## 2022-09-15 NOTE — Discharge Instructions (Signed)
Your workup was consistent with a stye.  I have sent antibiotic into the pharmacy for you.  Continue to use warm compress to this area for about 15 to 20 minutes every 4 hours while you are awake.  For any concerning symptoms return to the emergency room otherwise follow-up with your primary care provider.

## 2022-09-15 NOTE — ED Triage Notes (Signed)
Pt has stye to the left eye.

## 2022-09-15 NOTE — ED Notes (Signed)
Ambulance called for discharge per charge

## 2022-09-17 ENCOUNTER — Telehealth: Payer: Self-pay | Admitting: *Deleted

## 2022-09-17 NOTE — Progress Notes (Unsigned)
  Care Coordination  Outreach Note  09/17/2022 Name: Melissa West MRN: [id number] DOB: 1990-07-22   Care Coordination Outreach Attempts: An unsuccessful telephone outreach was attempted today to offer the patient information about available care coordination services.  Follow Up Plan:  Additional outreach attempts will be made to offer the patient care coordination information and services.   Encounter Outcome:  No Answer  Christie Nottingham  Care Coordination Care Guide  Direct Dial: [phone]

## 2022-09-22 NOTE — Progress Notes (Signed)
  Care Coordination  Outreach Note  09/22/2022 Name: Melissa West MRN: [id number] DOB: 25-Feb-1991   Care Coordination Outreach Attempts: A second unsuccessful outreach was attempted today to offer the patient with information about available care coordination services.  Follow Up Plan:  Additional outreach attempts will be made to offer the patient care coordination information and services.   Encounter Outcome:  No Answer  Christie Nottingham  Care Coordination Care Guide  Direct Dial: [phone]

## 2022-09-23 NOTE — Progress Notes (Signed)
  Care Coordination  Outreach Note  09/23/2022 Name: Melissa West MRN: [id number] DOB: 11/06/1990   Care Coordination Outreach Attempts: A third unsuccessful outreach was attempted today to offer the patient with information about available care coordination services.  Follow Up Plan:  No further outreach attempts will be made at this time. We have been unable to contact the patient to offer or enroll patient in care coordination services  Encounter Outcome:  No Answer  Christie Nottingham  Care Coordination Care Guide  Direct Dial: [phone]

## 2022-09-24 ENCOUNTER — Ambulatory Visit: Payer: Self-pay | Admitting: *Deleted

## 2022-09-24 NOTE — Patient Outreach (Signed)
  Care Coordination   09/24/2022 Name: Melissa West MRN: [id number] DOB: 09/28/1990   Care Coordination Outreach Attempts:  A third unsuccessful outreach was attempted today to offer the patient with information about available care coordination services.  Follow Up Plan:  No further outreach attempts will be made at this time. We have been unable to contact the patient to offer or enroll patient in care coordination services  Encounter Outcome:  No Answer   Care Coordination Interventions:  Yes, provided Collaboration with [facility] CMA, S Snead Unsuccessful engagement/pcp updated Olegario Messier)    Erdem Naas L. Noelle Penner, RN, BSN, CCM [facility] Care Management Community Coordinator Office number [phone]

## 2022-09-25 ENCOUNTER — Encounter (HOSPITAL_COMMUNITY): Payer: Self-pay

## 2022-09-25 ENCOUNTER — Emergency Department (HOSPITAL_COMMUNITY)
Admission: EM | Admit: 2022-09-25 | Discharge: 2022-09-26 | Disposition: A | Discharge status: Home / Self Care | Payer: 59 | Attending: Emergency Medicine | Admitting: Emergency Medicine

## 2022-09-25 ENCOUNTER — Other Ambulatory Visit: Payer: Self-pay

## 2022-09-25 DIAGNOSIS — Z743 Need for continuous supervision: Secondary | ICD-10-CM | POA: Diagnosis not present

## 2022-09-25 DIAGNOSIS — T83011A Breakdown (mechanical) of indwelling urethral catheter, initial encounter: Secondary | ICD-10-CM

## 2022-09-25 DIAGNOSIS — T83098A Other mechanical complication of other indwelling urethral catheter, initial encounter: Secondary | ICD-10-CM | POA: Diagnosis not present

## 2022-09-25 DIAGNOSIS — T849XXA Unspecified complication of internal orthopedic prosthetic device, implant and graft, initial encounter: Secondary | ICD-10-CM | POA: Diagnosis not present

## 2022-09-25 DIAGNOSIS — Y69 Unspecified misadventure during surgical and medical care: Secondary | ICD-10-CM | POA: Diagnosis not present

## 2022-09-25 DIAGNOSIS — T83091A Other mechanical complication of indwelling urethral catheter, initial encounter: Secondary | ICD-10-CM | POA: Diagnosis not present

## 2022-09-25 DIAGNOSIS — R6889 Other general symptoms and signs: Secondary | ICD-10-CM | POA: Diagnosis not present

## 2022-09-25 NOTE — ED Provider Notes (Signed)
[place] EMERGENCY DEPARTMENT AT [facility] Provider Note   [name]: [id number] Arrival date & time: 09/25/22  1324     History  Chief Complaint  Patient presents with   catheter clogged    Melissa West is a 32 y.o. female.  32 year old female with past medical history significant for neurogenic bladder from MS who has chronic suprapubic Foley catheter in place who presents today for Foley catheter malfunction.  She is noticed that it was not flushing since yesterday.  Denies other complaints.  The history is provided by the patient. No language interpreter was used.       Home Medications Prior to Admission medications   Medication Sig Start Date End Date Taking? Authorizing Provider  acetaminophen (TYLENOL) 325 MG tablet Take 2 tablets (650 mg total) by mouth every 6 (six) hours as needed for mild pain (or Fever >/= 101). 05/20/21   Shon Hale, MD  acetic acid 0.25 % irrigation Irrigate with as directed daily. Irrigate 60 daily 02/03/22   [name], Mardene Celeste, MD  AMBULATORY NON FORMULARY MEDICATION 30 mLs by Intracatheter route daily as needed. Medication Name: Sodium Chloride 0.9% 11/12/21   [name], Mardene Celeste, MD  AMBULATORY NON FORMULARY MEDICATION Medication Name: Bedside catheter bag 08/12/22   Bjorn Pippin, MD  doxycycline (VIBRAMYCIN) 100 MG capsule Take 1 capsule (100 mg total) by mouth 2 (two) times daily. Patient not taking: Reported on 08/25/2022 04/30/22   Smoot, Shawn Route, PA-C  ergocalciferol (VITAMIN D2) 1.25 MG (50000 UT) capsule Take 1 capsule (50,000 Units total) by mouth once a week. Patient not taking: Reported on 08/25/2022 11/07/21   Shon Hale, MD  KESIMPTA 20 MG/0.4ML SOAJ Inject 20 mg into the skin every 30 (thirty) days. 06/24/21   [provider]  Syringe, Disposable, (50-60CC SYRINGE) 60 ML MISC Flush 30-50cc of sterile water into catheter daily as needed 01/15/22   [name], Mardene Celeste, MD  Water For Irrigation, Sterile  (STERILE WATER FOR IRRIGATION) Irrigate with 50 mLs as directed daily. Irrigate catheter daily with 50cc of sterile water. 01/15/22   [name], Mardene Celeste, MD      Allergies    Patient has no known allergies.    Review of Systems   Review of Systems  Constitutional:  Negative for chills and fever.  Genitourinary:  Positive for difficulty urinating.  All other systems reviewed and are negative.   Physical Exam Updated Vital Signs BP 94/60 (BP Location: Left Arm)   Pulse 72   Temp 98.2 F (36.8 C) (Oral)   Resp 18   Ht 5\' 2"  (1.575 m)   Wt 44.9 kg   LMP 09/06/2022 (Approximate)   SpO2 100%   BMI 18.11 kg/m  Physical Exam Vitals and nursing note reviewed.  Constitutional:      General: She is not in acute distress.    Appearance: Normal appearance. She is not ill-appearing.  HENT:     Head: Normocephalic and atraumatic.     Nose: Nose normal.  Eyes:     Conjunctiva/sclera: Conjunctivae normal.  Pulmonary:     Effort: Pulmonary effort is normal. No respiratory distress.  Abdominal:     Comments: Suprapubic catheter noted.  No surrounding signs of infection or drainage.  Musculoskeletal:        General: No deformity.  Skin:    Findings: No rash.  Neurological:     Mental Status: She is alert.     ED Results / Procedures / Treatments   Labs (all  labs ordered are listed, but only abnormal results are displayed) Labs Reviewed - No data to display  EKG None  Radiology No results found.  Procedures BLADDER CATHETERIZATION  Date/Time: 09/25/2022 4:28 PM  Performed by: Marita Kansas, PA-C Authorized by: Marita Kansas, PA-C   Consent:    Consent obtained:  Verbal   Consent given by:  Patient   Risks, benefits, and alternatives were discussed: yes     Risks discussed:  False passage, infection, pain and incomplete procedure   Alternatives discussed:  No treatment and delayed treatment Universal protocol:    Procedure explained and questions answered to patient or  proxy's satisfaction: yes     Relevant documents present and verified: yes     Test results available: yes     Patient identity confirmed:  Verbally with patient and arm band Pre-procedure details:    Preparation: Patient was prepped and draped in usual sterile fashion   Anesthesia:    Anesthesia method:  None Procedure details:    Procedure performed by provider due to: suprapubic catheter.   Catheter insertion:  Indwelling   Catheter type:  Foley   Catheter size:  18 Fr   Bladder irrigation: no     Number of attempts:  1   Urine characteristics:  Yellow Post-procedure details:    Procedure completion:  Tolerated well, no immediate complications     Medications Ordered in ED Medications - No data to display  ED Course/ Medical Decision Making/ A&P                             Medical Decision Making  32 year old female presents today for concern of Foley catheter malfunction.  She states she was unable to flush the Foley catheter.  Denies other complaints.  No abdominal pain.  No fever.  Foley catheter change.  See procedure note above.  Patient discharged in stable condition peer return precaution discussed.  Patient.   Final Clinical Impression(s) / ED Diagnoses Final diagnoses:  Malfunction of Foley catheter, initial encounter [facility])    Rx / DC Orders ED Discharge Orders     None         Marita Kansas, PA-C 09/25/22 1632    Jacalyn Lefevre, MD 09/26/22 [phone]

## 2022-09-25 NOTE — ED Triage Notes (Signed)
Per EMS  Catheter clogged Here 10 days ago for the same and got it changed Last time it flowed was yesterday

## 2022-09-25 NOTE — Discharge Instructions (Signed)
Foley catheter was changed today.  For any concerning symptoms return to the emergency room otherwise follow-up with your primary care doctor.

## 2022-10-04 DIAGNOSIS — G35 Multiple sclerosis: Secondary | ICD-10-CM | POA: Diagnosis not present

## 2022-10-06 DIAGNOSIS — R339 Retention of urine, unspecified: Secondary | ICD-10-CM | POA: Diagnosis not present

## 2022-10-07 ENCOUNTER — Ambulatory Visit (INDEPENDENT_AMBULATORY_CARE_PROVIDER_SITE_OTHER): Payer: 59

## 2022-10-07 DIAGNOSIS — R339 Retention of urine, unspecified: Secondary | ICD-10-CM | POA: Diagnosis not present

## 2022-10-07 NOTE — Progress Notes (Signed)
Suprapubic Cath Change  Patient is present today for a suprapubic catheter change due to urinary retention.  10ml of water was drained from the balloon, a 18FR foley cath was removed from the tract with out difficulty.  Site was cleaned and prepped in a sterile fashion with betadine.  A 20FR foley cath was replaced into the tract no complications were noted. Urine return was noted, 10 ml of sterile water was inflated into the balloon and a leg bag was attached for drainage.  Patient tolerated well. A night bag was given to patient and proper instruction was given on how to switch bags.    Performed by: Aylinn Rydberg LPN  Follow up: keep scheduled f/u

## 2022-10-07 NOTE — Patient Instructions (Signed)

## 2022-10-08 ENCOUNTER — Emergency Department (HOSPITAL_COMMUNITY): Payer: 59

## 2022-10-08 ENCOUNTER — Inpatient Hospital Stay (HOSPITAL_COMMUNITY)
Admission: EM | Admit: 2022-10-08 | Discharge: 2022-10-10 | DRG: 700 | Disposition: A | Discharge status: Home / Self Care | Payer: 59 | Attending: Internal Medicine | Admitting: Internal Medicine

## 2022-10-08 ENCOUNTER — Encounter (HOSPITAL_COMMUNITY): Payer: Self-pay | Admitting: Emergency Medicine

## 2022-10-08 ENCOUNTER — Other Ambulatory Visit: Payer: Self-pay

## 2022-10-08 DIAGNOSIS — R9082 White matter disease, unspecified: Secondary | ICD-10-CM | POA: Diagnosis not present

## 2022-10-08 DIAGNOSIS — G35 Multiple sclerosis: Secondary | ICD-10-CM | POA: Diagnosis not present

## 2022-10-08 DIAGNOSIS — Z993 Dependence on wheelchair: Secondary | ICD-10-CM

## 2022-10-08 DIAGNOSIS — R000 Tachycardia, unspecified: Secondary | ICD-10-CM | POA: Diagnosis not present

## 2022-10-08 DIAGNOSIS — Z743 Need for continuous supervision: Secondary | ICD-10-CM | POA: Diagnosis not present

## 2022-10-08 DIAGNOSIS — I499 Cardiac arrhythmia, unspecified: Secondary | ICD-10-CM | POA: Diagnosis not present

## 2022-10-08 DIAGNOSIS — G959 Disease of spinal cord, unspecified: Secondary | ICD-10-CM | POA: Diagnosis not present

## 2022-10-08 DIAGNOSIS — R29898 Other symptoms and signs involving the musculoskeletal system: Secondary | ICD-10-CM | POA: Diagnosis present

## 2022-10-08 DIAGNOSIS — E876 Hypokalemia: Secondary | ICD-10-CM | POA: Insufficient documentation

## 2022-10-08 DIAGNOSIS — R531 Weakness: Secondary | ICD-10-CM | POA: Diagnosis not present

## 2022-10-08 DIAGNOSIS — T83511A Infection and inflammatory reaction due to indwelling urethral catheter, initial encounter: Secondary | ICD-10-CM | POA: Diagnosis not present

## 2022-10-08 DIAGNOSIS — N390 Urinary tract infection, site not specified: Secondary | ICD-10-CM | POA: Diagnosis not present

## 2022-10-08 DIAGNOSIS — Z8249 Family history of ischemic heart disease and other diseases of the circulatory system: Secondary | ICD-10-CM | POA: Diagnosis not present

## 2022-10-08 DIAGNOSIS — N3000 Acute cystitis without hematuria: Secondary | ICD-10-CM | POA: Diagnosis not present

## 2022-10-08 DIAGNOSIS — Y846 Urinary catheterization as the cause of abnormal reaction of the patient, or of later complication, without mention of misadventure at the time of the procedure: Secondary | ICD-10-CM | POA: Diagnosis present

## 2022-10-08 DIAGNOSIS — G4489 Other headache syndrome: Secondary | ICD-10-CM | POA: Diagnosis not present

## 2022-10-08 DIAGNOSIS — G319 Degenerative disease of nervous system, unspecified: Secondary | ICD-10-CM | POA: Diagnosis not present

## 2022-10-08 LAB — CBC WITH DIFFERENTIAL/PLATELET
Abs Immature Granulocytes: 0.03 10*3/uL (ref 0.00–0.07)
Basophils Absolute: 0 10*3/uL (ref 0.0–0.1)
Basophils Relative: 0 %
Eosinophils Absolute: 0 10*3/uL (ref 0.0–0.5)
Eosinophils Relative: 0 %
HCT: 33.7 % — ABNORMAL LOW (ref 36.0–46.0)
Hemoglobin: 10.9 g/dL — ABNORMAL LOW (ref 12.0–15.0)
Immature Granulocytes: 0 %
Lymphocytes Relative: 10 %
Lymphs Abs: 0.7 10*3/uL (ref 0.7–4.0)
MCH: 27.6 pg (ref 26.0–34.0)
MCHC: 32.3 g/dL (ref 30.0–36.0)
MCV: 85.3 fL (ref 80.0–100.0)
Monocytes Absolute: 0.6 10*3/uL (ref 0.1–1.0)
Monocytes Relative: 8 %
Neutro Abs: 6.1 10*3/uL (ref 1.7–7.7)
Neutrophils Relative %: 82 %
Platelets: 189 10*3/uL (ref 150–400)
RBC: 3.95 MIL/uL (ref 3.87–5.11)
RDW: 14.9 % (ref 11.5–15.5)
WBC: 7.4 10*3/uL (ref 4.0–10.5)
nRBC: 0 % (ref 0.0–0.2)

## 2022-10-08 LAB — URINALYSIS, W/ REFLEX TO CULTURE (INFECTION SUSPECTED)
Bilirubin Urine: NEGATIVE
Glucose, UA: NEGATIVE mg/dL
Ketones, ur: NEGATIVE mg/dL
Nitrite: POSITIVE — AB
Protein, ur: 100 mg/dL — AB
Specific Gravity, Urine: 1.012 (ref 1.005–1.030)
WBC, UA: >50 WBC/hpf (ref 0–5)
pH: 6 (ref 5.0–8.0)

## 2022-10-08 LAB — BASIC METABOLIC PANEL
Anion gap: 8 (ref 5–15)
BUN: 10 mg/dL (ref 6–20)
CO2: 24 mmol/L (ref 22–32)
Calcium: 8.4 mg/dL — ABNORMAL LOW (ref 8.9–10.3)
Chloride: 106 mmol/L (ref 98–111)
Creatinine, Ser: 0.73 mg/dL (ref 0.44–1.00)
GFR, Estimated: >60 mL/min (ref >60)
Glucose, Bld: 102 mg/dL — ABNORMAL HIGH (ref 70–99)
Potassium: 3.3 mmol/L — ABNORMAL LOW (ref 3.5–5.1)
Sodium: 138 mmol/L (ref 135–145)

## 2022-10-08 LAB — PREGNANCY, URINE: Preg Test, Ur: NEGATIVE

## 2022-10-08 MED ORDER — GADOBUTROL 1 MMOL/ML IV SOLN
5.0000 mL | Freq: Once | INTRAVENOUS | Status: AC | PRN
  Administered 2022-10-08: 5 mL via INTRAVENOUS

## 2022-10-08 MED ORDER — METOCLOPRAMIDE HCL 5 MG/ML IJ SOLN
10.0000 mg | Freq: Once | INTRAMUSCULAR | Status: AC
  Administered 2022-10-08: 10 mg via INTRAVENOUS
  Filled 2022-10-08: qty 2

## 2022-10-08 MED ORDER — SODIUM CHLORIDE 0.9 % IV BOLUS
500.0000 mL | Freq: Once | INTRAVENOUS | Status: AC
  Administered 2022-10-08: 500 mL via INTRAVENOUS

## 2022-10-08 MED ORDER — SODIUM CHLORIDE 0.9 % IV SOLN
1.0000 g | Freq: Once | INTRAVENOUS | Status: AC
  Administered 2022-10-08: 1 g via INTRAVENOUS
  Filled 2022-10-08: qty 10

## 2022-10-08 NOTE — H&P (Signed)
History and Physical    Patient: Melissa West WUJ:[id number] DOB: 26-Jul-1990 DOA: 10/08/2022 DOS: the patient was seen and examined on 10/09/2022 PCP: Patient, No Pcp Per  Patient coming from: Home  Chief Complaint:  Chief Complaint  Patient presents with   Weakness   HPI: Melissa West is a 32 y.o. female with medical history significant of multiple sclerosis, chronic urinary retention requiring suprapubic Foley catheter, wheelchair-bound at baseline who presents to the emergency department due to bilateral upper extremity weakness.  Patient uses manual wheelchair, this morning she noted minimal use of her upper extremities with difficulty of being able to maneuver manual wheelchair.  Last well-known was last night prior to going to bed.  She also complained of frontal headache that was rated as 10/10 since last evening, she usually gets these every month about the same time as her period.  She denies any noticing using medication.  ED Course:  In the emergency department, she was tachypneic with respiratory rate of 23/min, other vital signs were within normal range.  Workup in the ED showed normocytic anemia, and normal BMP PAX8 for potassium of 3.3 and blood glucose of 102, pregnancy test was negative, urinalysis was positive for UTI. MRI cervical spine without and with contrast showed no significant change in diffuse patchy increased T2/STIR signal throughout the entire visualized cervical cord consistent with stated history of multiple sclerosis. No abnormal cord enhancement. Chest x-ray showed no active disease MRI brain without contrast showed no acute intracranial abnormality Neurologist on-call was consulted and believed that patient's symptoms was possibly secondary to an infection and not due to stroke.  Patient does not need to be transferred to [facility] and can be managed he per EDP. Hospitalist was asked to admit patient for further evaluation and  management.   Review of Systems: Review of systems as noted in the HPI. All other systems reviewed and are negative.   Past Medical History:  Diagnosis Date   MS (multiple sclerosis) (HCC)    No pertinent past medical history    Past Surgical History:  Procedure Laterality Date   CESAREAN SECTION  08/09/2011   Procedure: CESAREAN SECTION;  Surgeon: Lesly Dukes, MD;  Location: [facility] ORS;  Service: Gynecology;  Laterality: N/A;   IR CATHETER TUBE CHANGE  07/22/2021   IR CATHETER TUBE CHANGE  09/10/2021    Social History:  reports that she has never smoked. She has never used smokeless tobacco. She reports that she does not drink alcohol and does not use drugs.   No Known Allergies  Family History  Problem Relation Age of Onset   Diabetes Maternal Grandmother    Hypertension Maternal Grandmother    Kidney disease Maternal Grandmother      Prior to Admission medications   Medication Sig Start Date End Date Taking? Authorizing Provider  acetaminophen (TYLENOL) 325 MG tablet Take 2 tablets (650 mg total) by mouth every 6 (six) hours as needed for mild pain (or Fever >/= 101). 05/20/21   Shon Hale, MD  acetic acid 0.25 % irrigation Irrigate with as directed daily. Irrigate 60 daily 02/03/22   [name], Mardene Celeste, MD  AMBULATORY NON FORMULARY MEDICATION 30 mLs by Intracatheter route daily as needed. Medication Name: Sodium Chloride 0.9% 11/12/21   [name], Mardene Celeste, MD  AMBULATORY NON FORMULARY MEDICATION Medication Name: Bedside catheter bag 08/12/22   Bjorn Pippin, MD  doxycycline (VIBRAMYCIN) 100 MG capsule Take 1 capsule (100 mg total) by mouth 2 (two) times daily. Patient not  taking: Reported on 08/25/2022 04/30/22   Smoot, Shawn Route, PA-C  ergocalciferol (VITAMIN D2) 1.25 MG (50000 UT) capsule Take 1 capsule (50,000 Units total) by mouth once a week. Patient not taking: Reported on 08/25/2022 11/07/21   Shon Hale, MD  KESIMPTA 20 MG/0.4ML SOAJ Inject 20 mg into the skin every  30 (thirty) days. 06/24/21   [provider]  Syringe, Disposable, (50-60CC SYRINGE) 60 ML MISC Flush 30-50cc of sterile water into catheter daily as needed 01/15/22   [name], Mardene Celeste, MD  Water For Irrigation, Sterile (STERILE WATER FOR IRRIGATION) Irrigate with 50 mLs as directed daily. Irrigate catheter daily with 50cc of sterile water. 01/15/22   Malen Gauze, MD    Physical Exam: BP 98/63   Pulse 87   Temp 99.7 F (37.6 C) (Oral)   Resp 14   LMP 09/06/2022 (Approximate)   SpO2 96%   General: 32 y.o. year-old female well developed well nourished in no acute distress.  Alert and oriented x3. HEENT: NCAT, disconjugate gaze Neck: Supple, trachea medial Cardiovascular: Regular rate and rhythm with no rubs or gallops.  No thyromegaly or JVD noted.  No lower extremity edema. 2/4 pulses in all 4 extremities. Respiratory: Clear to auscultation with no wheezes or rales. Good inspiratory effort. Abdomen: Soft, nontender nondistended with normal bowel sounds x4 quadrants. Muskuloskeletal: No cyanosis, clubbing or edema noted bilaterally Genitourinary: Foley catheter in place.  This was changed yesterday Neuro: Minimal use of lower extremities bilaterally at baseline.  Upper extremity hand grip 2/5, upper extremity strength 3/5. Skin: No ulcerative lesions noted or rashes Psychiatry: Judgement and insight appear normal. Mood is appropriate for condition and setting          Labs on Admission:  Basic Metabolic Panel: Recent Labs  Lab 10/08/22 1546  NA 138  K 3.3*  CL 106  CO2 24  GLUCOSE 102*  BUN 10  CREATININE 0.73  CALCIUM 8.4*   Liver Function Tests: No results for input(s): "AST", "ALT", "ALKPHOS", "BILITOT", "PROT", "ALBUMIN" in the last 168 hours. No results for input(s): "LIPASE", "AMYLASE" in the last 168 hours. No results for input(s): "AMMONIA" in the last 168 hours. CBC: Recent Labs  Lab 10/08/22 1546  WBC 7.4  NEUTROABS 6.1  HGB 10.9*  HCT  33.7*  MCV 85.3  PLT 189   Cardiac Enzymes: No results for input(s): "CKTOTAL", "CKMB", "CKMBINDEX", "TROPONINI" in the last 168 hours.  BNP (last 3 results) No results for input(s): "BNP" in the last 8760 hours.  ProBNP (last 3 results) No results for input(s): "PROBNP" in the last 8760 hours.  CBG: No results for input(s): "GLUCAP" in the last 168 hours.  Radiological Exams on Admission: MR Cervical Spine W and Wo Contrast  Result Date: 10/08/2022 CLINICAL DATA:  Acute myelopathy of the cervical spine. Neuro deficit. Stroke suspected. Reported history of multiple sclerosis. EXAM: MRI CERVICAL SPINE WITHOUT AND WITH CONTRAST TECHNIQUE: Multiplanar and multiecho pulse sequences of the cervical spine, to include the craniocervical junction and cervicothoracic junction, were obtained without and with intravenous contrast. CONTRAST:  5mL GADAVIST GADOBUTROL 1 MMOL/ML IV SOLN COMPARISON:  MRI cervical spine 06/08/2021 FINDINGS: Alignment: No sagittal spondylolisthesis. Vertebrae: Vertebral body heights are maintained. Normal marrow signal. Disc spaces are preserved. Cord: No significant change in diffuse patchy increased T2/STIR signal throughout the entire visualized cervical cord. Again no abnormal cord enhancement is visualized. The cord is normal in caliber. Posterior Fossa, vertebral arteries, paraspinal tissues: Negative. Disc levels: No posterior disc  bulge, central canal narrowing, or neuroforaminal stenosis. IMPRESSION: No significant change in diffuse patchy increased T2/STIR signal throughout the entire visualized cervical cord consistent with stated history of multiple sclerosis. No abnormal cord enhancement. Electronically Signed   By: Neita Garnet M.D.   On: 10/08/2022 20:50   DG Chest Port 1 View  Result Date: 10/08/2022 CLINICAL DATA:  Weakness EXAM: PORTABLE CHEST 1 VIEW COMPARISON:  06/17/2021, 05/16/2021 FINDINGS: Rotation limits the exam. No focal airspace disease or effusion.  Stable cardiomediastinal silhouette. No pneumothorax. IMPRESSION: No active disease. Rotation limits the exam. Electronically Signed   By: Jasmine Pang M.D.   On: 10/08/2022 15:44    EKG: I independently viewed the EKG done and my findings are as followed: Sinus tachycardia at a rate of 107 bpm  Assessment/Plan Present on Admission:  Upper extremity weakness  UTI (urinary tract infection)  MS (multiple sclerosis) (HCC)  Principal Problem:   Upper extremity weakness Active Problems:   MS (multiple sclerosis) (HCC)   UTI (urinary tract infection)   Hypokalemia  Bilateral upper extremity weakness r/o acute ischemic stroke/MS flare MRI brain and MRI cervical spine showed no acute findings and there was no indication for IV steroids at this time as recommended by neurology consulted per EDP Patient was started on IV ceftriaxone for UTI Continue fall precaution Continue PT/OT eval and treat  Multiple sclerosis Stable, continue management as described above  UTI POA Continue IV ceftriaxone Urine culture pending  Hypokalemia K+ 3.3, this will be replenished   DVT prophylaxis: Lovenox  Advance Care Planning: Full code  Consults: None  Family Communication: None at bedside  Severity of Illness: The appropriate patient status for this patient is INPATIENT. Inpatient status is judged to be reasonable and necessary in order to provide the required intensity of service to ensure the patient's safety. The patient's presenting symptoms, physical exam findings, and initial radiographic and laboratory data in the context of their chronic comorbidities is felt to place them at high risk for further clinical deterioration. Furthermore, it is not anticipated that the patient will be medically stable for discharge from the hospital within 2 midnights of admission.   * I certify that at the point of admission it is my clinical judgment that the patient will require inpatient hospital care  spanning beyond 2 midnights from the point of admission due to high intensity of service, high risk for further deterioration and high frequency of surveillance required.*  Author: Frankey Shown, DO 10/09/2022 12:18 AM  For on call review [email or web address].

## 2022-10-08 NOTE — ED Triage Notes (Signed)
Pt from home via [facility] with reports of waking up this morning at 0700 with right sided weakness and headache. Pt reports she was normal at 2300 last night before bed. Pt with history of MS.

## 2022-10-08 NOTE — ED Provider Notes (Signed)
[place] [facility] AT [facility] Provider Note   [name]: [id number] Arrival date & time: 10/08/22  1341     History {Add pertinent medical, surgical, social history, OB history to HPI:1} Chief Complaint  Patient presents with   Weakness    Melissa West is a 32 y.o. female.  She has a history of MS, has a chronic Foley and limited use of lower extremities.  She uses a manual wheelchair to get around.  She said she has had a frontal headache 10 out of 10 since last evening.  She usually gets this every month about the same time as her period.  When she woke up this morning she had minimal use of her upper extremities which is new for her.  She denies any trauma no fevers chest pain shortness of breath.  She does not think there is been any recent medication changes.  The history is provided by the patient.  Weakness Severity:  Moderate Duration:  1 day Timing:  Constant Progression:  Unchanged Chronicity:  New Relieved by:  None tried Worsened by:  Nothing Ineffective treatments:  None tried Associated symptoms: difficulty walking and headaches   Associated symptoms: no abdominal pain, no chest pain, no falls, no fever, no nausea, no shortness of breath and no vomiting   Risk factors: neurologic disease        Home Medications Prior to Admission medications   Medication Sig Start Date End Date Taking? Authorizing Provider  acetaminophen (TYLENOL) 325 MG tablet Take 2 tablets (650 mg total) by mouth every 6 (six) hours as needed for mild pain (or Fever >/= 101). 05/20/21   Shon Hale, MD  acetic acid 0.25 % irrigation Irrigate with as directed daily. Irrigate 60 daily 02/03/22   [name], Mardene Celeste, MD  AMBULATORY NON FORMULARY MEDICATION 30 mLs by Intracatheter route daily as needed. Medication Name: Sodium Chloride 0.9% 11/12/21   [name], Mardene Celeste, MD  AMBULATORY NON FORMULARY MEDICATION Medication Name: Bedside catheter bag 08/12/22   Bjorn Pippin, MD  doxycycline (VIBRAMYCIN) 100 MG capsule Take 1 capsule (100 mg total) by mouth 2 (two) times daily. Patient not taking: Reported on 08/25/2022 04/30/22   Smoot, Shawn Route, PA-C  ergocalciferol (VITAMIN D2) 1.25 MG (50000 UT) capsule Take 1 capsule (50,000 Units total) by mouth once a week. Patient not taking: Reported on 08/25/2022 11/07/21   Shon Hale, MD  KESIMPTA 20 MG/0.4ML SOAJ Inject 20 mg into the skin every 30 (thirty) days. 06/24/21   [provider]  Syringe, Disposable, (50-60CC SYRINGE) 60 ML MISC Flush 30-50cc of sterile water into catheter daily as needed 01/15/22   [name], Mardene Celeste, MD  Water For Irrigation, Sterile (STERILE WATER FOR IRRIGATION) Irrigate with 50 mLs as directed daily. Irrigate catheter daily with 50cc of sterile water. 01/15/22   [name], Mardene Celeste, MD      Allergies    Patient has no known allergies.    Review of Systems   Review of Systems  Constitutional:  Negative for fever.  Respiratory:  Negative for shortness of breath.   Cardiovascular:  Negative for chest pain.  Gastrointestinal:  Negative for abdominal pain, nausea and vomiting.  Musculoskeletal:  Negative for falls.  Neurological:  Positive for weakness and headaches.    Physical Exam Updated Vital Signs BP 107/76   Pulse 99   Temp 97.9 F (36.6 C)   Resp (!) 23   LMP 09/06/2022 (Approximate)   SpO2 99%  Physical Exam Vitals  and nursing note reviewed.  Constitutional:      General: She is not in acute distress.    Appearance: Normal appearance. She is well-developed.  HENT:     Head: Normocephalic and atraumatic.  Eyes:     Conjunctiva/sclera: Conjunctivae normal.  Cardiovascular:     Rate and Rhythm: Normal rate and regular rhythm.     Heart sounds: No murmur heard. Pulmonary:     Effort: Pulmonary effort is normal. No respiratory distress.     Breath sounds: Normal breath sounds.  Abdominal:     Palpations: Abdomen is soft.     Tenderness: There is no  abdominal tenderness. There is no guarding or rebound.  Genitourinary:    Comments: Foley catheter in place Musculoskeletal:        General: No swelling.     Cervical back: Neck supple.  Skin:    General: Skin is warm and dry.     Capillary Refill: Capillary refill takes less than 2 seconds.  Neurological:     Mental Status: She is alert.     Cranial Nerves: Cranial nerve deficit present.     Motor: Weakness present.     Gait: Gait abnormal.     Comments: She is slow speech pattern.  She has minimal use of her lower extremities bilaterally at baseline.  She has 3+ out of 5 strength of her upper extremities and maybe 2 out of 5 strength of handgrip bilaterally.  She has some disconjugate gaze     ED Results / Procedures / Treatments   Labs (all labs ordered are listed, but only abnormal results are displayed) Labs Reviewed - No data to display  EKG EKG Interpretation Date/Time:  Friday October 08 2022 14:19:36 EDT Ventricular Rate:  107 PR Interval:  141 QRS Duration:  76 QT Interval:  333 QTC Calculation: 445 R Axis:   74  Text Interpretation: Sinus tachycardia Nonspecific T abnormalities, anterior leads No significant change since prior 5/24 Confirmed by Meridee Score [phone]) on 10/08/2022 2:59:33 PM  Radiology No results found.  Procedures Procedures  {Document cardiac monitor, telemetry assessment procedure when appropriate:1}  Medications Ordered in ED Medications  metoCLOPramide (REGLAN) injection 10 mg (has no administration in time range)  sodium chloride 0.9 % bolus 500 mL (has no administration in time range)    ED Course/ Medical Decision Making/ A&P   {   Click here for ABCD2, HEART and other calculatorsREFRESH Note before signing :1}                          Medical Decision Making Amount and/or Complexity of Data Reviewed Labs: ordered. Radiology: ordered.  Risk Prescription drug management.   This patient complains of ***; this involves an  extensive number of treatment Options and is a complaint that carries with it a high risk of complications and morbidity. The differential includes ***  I ordered, reviewed and interpreted labs, which included *** I ordered medication *** and reviewed PMP when indicated. I ordered imaging studies which included *** and I independently    visualized and interpreted imaging which showed *** Additional history obtained from *** Previous records obtained and reviewed *** I consulted *** and discussed lab and imaging findings and discussed disposition.  Cardiac monitoring reviewed, *** Social determinants considered, *** Critical Interventions: ***  After the interventions stated above, I reevaluated the patient and found *** Admission and further testing considered, ***   {Document critical care time  when appropriate:1} {Document review of labs and clinical decision tools ie heart score, Chads2Vasc2 etc:1}  {Document your independent review of radiology images, and any outside records:1} {Document your discussion with family members, caretakers, and with consultants:1} {Document social determinants of health affecting pt's care:1} {Document your decision making why or why not admission, treatments were needed:1} Final Clinical Impression(s) / ED Diagnoses Final diagnoses:  None    Rx / DC Orders ED Discharge Orders     None

## 2022-10-09 ENCOUNTER — Encounter (HOSPITAL_COMMUNITY): Payer: Self-pay | Admitting: Internal Medicine

## 2022-10-09 DIAGNOSIS — E876 Hypokalemia: Secondary | ICD-10-CM | POA: Insufficient documentation

## 2022-10-09 DIAGNOSIS — R29898 Other symptoms and signs involving the musculoskeletal system: Secondary | ICD-10-CM | POA: Diagnosis not present

## 2022-10-09 LAB — CBC
HCT: 29.7 % — ABNORMAL LOW (ref 36.0–46.0)
Hemoglobin: 9.7 g/dL — ABNORMAL LOW (ref 12.0–15.0)
MCH: 27.6 pg (ref 26.0–34.0)
MCHC: 32.7 g/dL (ref 30.0–36.0)
MCV: 84.6 fL (ref 80.0–100.0)
Platelets: 184 10*3/uL (ref 150–400)
RBC: 3.51 MIL/uL — ABNORMAL LOW (ref 3.87–5.11)
RDW: 15.1 % (ref 11.5–15.5)
WBC: 6.7 10*3/uL (ref 4.0–10.5)
nRBC: 0 % (ref 0.0–0.2)

## 2022-10-09 LAB — COMPREHENSIVE METABOLIC PANEL
ALT: 15 U/L (ref 0–44)
AST: 17 U/L (ref 15–41)
Albumin: 2.7 g/dL — ABNORMAL LOW (ref 3.5–5.0)
Alkaline Phosphatase: 48 U/L (ref 38–126)
Anion gap: 8 (ref 5–15)
BUN: 10 mg/dL (ref 6–20)
CO2: 22 mmol/L (ref 22–32)
Calcium: 8.2 mg/dL — ABNORMAL LOW (ref 8.9–10.3)
Chloride: 105 mmol/L (ref 98–111)
Creatinine, Ser: 0.65 mg/dL (ref 0.44–1.00)
GFR, Estimated: >60 mL/min (ref >60)
Glucose, Bld: 87 mg/dL (ref 70–99)
Potassium: 3.4 mmol/L — ABNORMAL LOW (ref 3.5–5.1)
Sodium: 135 mmol/L (ref 135–145)
Total Bilirubin: 0.6 mg/dL (ref 0.3–1.2)
Total Protein: 6.4 g/dL — ABNORMAL LOW (ref 6.5–8.1)

## 2022-10-09 LAB — PHOSPHORUS: Phosphorus: 2.2 mg/dL — ABNORMAL LOW (ref 2.5–4.6)

## 2022-10-09 LAB — URINE CULTURE

## 2022-10-09 LAB — MAGNESIUM: Magnesium: 1.6 mg/dL — ABNORMAL LOW (ref 1.7–2.4)

## 2022-10-09 LAB — HIV ANTIBODY (ROUTINE TESTING W REFLEX): HIV Screen 4th Generation wRfx: NONREACTIVE

## 2022-10-09 MED ORDER — SODIUM CHLORIDE 0.9 % IV BOLUS
500.0000 mL | Freq: Once | INTRAVENOUS | Status: AC
  Administered 2022-10-10: 500 mL via INTRAVENOUS

## 2022-10-09 MED ORDER — ACETAMINOPHEN 650 MG RE SUPP: 650.0000 mg | Freq: Four times a day (QID) | RECTAL | Status: DC | PRN

## 2022-10-09 MED ORDER — POTASSIUM CHLORIDE CRYS ER 20 MEQ PO TBCR
40.0000 meq | EXTENDED_RELEASE_TABLET | Freq: Once | ORAL | Status: AC
  Administered 2022-10-09: 40 meq via ORAL
  Filled 2022-10-09: qty 2

## 2022-10-09 MED ORDER — ONDANSETRON HCL 4 MG PO TABS: 4.0000 mg | ORAL_TABLET | Freq: Four times a day (QID) | ORAL | Status: DC | PRN

## 2022-10-09 MED ORDER — MAGNESIUM SULFATE 2 GM/50ML IV SOLN
2.0000 g | Freq: Once | INTRAVENOUS | Status: AC
  Administered 2022-10-09: 2 g via INTRAVENOUS
  Filled 2022-10-09: qty 50

## 2022-10-09 MED ORDER — ONDANSETRON HCL 4 MG/2ML IJ SOLN: 4.0000 mg | Freq: Four times a day (QID) | INTRAMUSCULAR | Status: DC | PRN

## 2022-10-09 MED ORDER — SODIUM CHLORIDE 0.9 % IV SOLN
1.0000 g | INTRAVENOUS | Status: DC
  Administered 2022-10-09: 1 g via INTRAVENOUS
  Filled 2022-10-09: qty 10

## 2022-10-09 MED ORDER — POTASSIUM CHLORIDE CRYS ER 20 MEQ PO TBCR
40.0000 meq | EXTENDED_RELEASE_TABLET | Freq: Two times a day (BID) | ORAL | Status: AC
  Administered 2022-10-09 (×2): 40 meq via ORAL
  Filled 2022-10-09 (×2): qty 2

## 2022-10-09 MED ORDER — ACETAMINOPHEN 325 MG PO TABS
650.0000 mg | ORAL_TABLET | Freq: Four times a day (QID) | ORAL | Status: DC | PRN
  Administered 2022-10-09 (×3): 650 mg via ORAL
  Filled 2022-10-09 (×3): qty 2

## 2022-10-09 MED ORDER — ENOXAPARIN SODIUM 30 MG/0.3ML IJ SOSY
30.0000 mg | PREFILLED_SYRINGE | INTRAMUSCULAR | Status: DC
  Administered 2022-10-09 – 2022-10-10 (×2): 30 mg via SUBCUTANEOUS
  Filled 2022-10-09 (×2): qty 0.3

## 2022-10-09 NOTE — Progress Notes (Signed)
PT Cancellation Note  Patient Details Name: Melissa West MRN: [id number] DOB: Jun 11, 1990   Cancelled Treatment:    Reason Eval/Treat Not Completed: PT screened, no needs identified, will sign off.  Patient at baseline, non-ambulatory, uses mechanical lift for transfers, uses wheelchair for mobility.  Recommend patient out of bed to chair daily using mechanical lift for length of stay.   12:29 PM, 10/09/22 Ocie Bob, MPT Physical Therapist with [facility] [phone] office [phone] mobile phone

## 2022-10-09 NOTE — Progress Notes (Signed)
Patient arrived from ED to unit at around 0030 in stretcher. Maximum assist x's 2. Patient A&O x's 4. On room air. Vitals WNL although BP are soft. Acetaminophen given for headache that was effective. Calm and cooperative during assessment. Suprapubic catheter in place. Skin assessment WNL. Patient denies having any concerns at this time and went to sleep for the remainder of shift. Can easily be aroused.

## 2022-10-09 NOTE — Progress Notes (Signed)
PROGRESS NOTE    Melissa West  XBM:[id number] DOB: [birth year] DOA: 10/08/2022 PCP: Patient, No Pcp Per   Brief Narrative:  Melissa West is a 32 y.o. female with medical history significant of multiple sclerosis, chronic urinary retention requiring suprapubic Foley catheter, wheelchair-bound at baseline who presents to the emergency department due to bilateral upper extremity weakness.  Patient was admitted for concern for acute CVA/MS flare, but MRI does not show any acute findings and no further recommendations for IV steroids or other management per neurology.  She is noted to have UTI and has been started on treatment with Rocephin with cultures pending.  Assessment & Plan:   Principal Problem:   Upper extremity weakness Active Problems:   MS (multiple sclerosis) (HCC)   UTI (urinary tract infection)   Hypokalemia  Assessment and Plan:   Bilateral upper extremity weakness r/o acute ischemic stroke/MS flare MRI brain and MRI cervical spine showed no acute findings and there was no indication for IV steroids at this time as recommended by neurology consulted per EDP Patient was started on IV ceftriaxone for UTI Continue fall precaution Continue PT/OT eval and treat She is wheelchair-bound at home and lives alone   Multiple sclerosis Stable, continue management as described above   UTI POA Continue IV ceftriaxone Urine culture pending   Hypokalemia/hypomagnesemia K+ 3.4, this will be replenished      DVT prophylaxis:Lovenox Code Status: Full Family Communication: None at bedside Disposition Plan:  Status is: Inpatient Remains inpatient appropriate because: Continued need for IV antibiotics  Consultants:  None  Procedures:  None  Antimicrobials:  Anti-infectives (From admission, onward)    Start     Dose/Rate Route Frequency Ordered Stop   10/09/22 2200  cefTRIAXone (ROCEPHIN) 1 g in sodium chloride 0.9 % 100 mL IVPB        1 g 200 mL/hr over  30 Minutes Intravenous Every 24 hours 10/09/22 0011     10/08/22 2230  cefTRIAXone (ROCEPHIN) 1 g in sodium chloride 0.9 % 100 mL IVPB        1 g 200 mL/hr over 30 Minutes Intravenous  Once 10/08/22 2228 10/08/22 2308      Subjective: Patient seen and evaluated today with no new acute complaints or concerns. No acute concerns or events noted overnight.  Objective: Vitals:   10/09/22 0013 10/09/22 0032 10/09/22 0200 10/09/22 0632  BP:  96/62 98/64 92/62   Pulse:  88 92 81  Resp:  17  14  Temp:  (!) 101.4 F (38.6 C) 98.4 F (36.9 C) 98.4 F (36.9 C)  TempSrc:  Oral Oral Oral  SpO2: 100% 100% 100% 100%  Weight:  44.9 kg    Height:  5' 2.01" (1.575 m)      Intake/Output Summary (Last 24 hours) at 10/09/2022 0702 Last data filed at 10/09/2022 0018 Gross per 24 hour  Intake --  Output 600 ml  Net -600 ml   Filed Weights   10/09/22 0032  Weight: 44.9 kg    Examination:  General exam: Appears calm and comfortable  Respiratory system: Clear to auscultation. Respiratory effort normal. Cardiovascular system: S1 & S2 heard, RRR.  Gastrointestinal system: Abdomen is soft Central nervous system: Alert and awake Extremities: No edema Skin: No significant lesions noted Psychiatry: Flat affect.    Data Reviewed: I have personally reviewed following labs and imaging studies  CBC: Recent Labs  Lab 10/08/22 1546 10/09/22 0531  WBC 7.4 6.7  NEUTROABS 6.1  --   HGB 10.9*  9.7*  HCT 33.7* 29.7*  MCV 85.3 84.6  PLT 189 184   Basic Metabolic Panel: Recent Labs  Lab 10/08/22 1546  NA 138  K 3.3*  CL 106  CO2 24  GLUCOSE 102*  BUN 10  CREATININE 0.73  CALCIUM 8.4*   GFR: Estimated Creatinine Clearance: 70.9 mL/min (by C-G formula based on SCr of 0.73 mg/dL). Liver Function Tests: No results for input(s): "AST", "ALT", "ALKPHOS", "BILITOT", "PROT", "ALBUMIN" in the last 168 hours. No results for input(s): "LIPASE", "AMYLASE" in the last 168 hours. No results for  input(s): "AMMONIA" in the last 168 hours. Coagulation Profile: No results for input(s): "INR", "PROTIME" in the last 168 hours. Cardiac Enzymes: No results for input(s): "CKTOTAL", "CKMB", "CKMBINDEX", "TROPONINI" in the last 168 hours. BNP (last 3 results) No results for input(s): "PROBNP" in the last 8760 hours. HbA1C: No results for input(s): "HGBA1C" in the last 72 hours. CBG: No results for input(s): "GLUCAP" in the last 168 hours. Lipid Profile: No results for input(s): "CHOL", "HDL", "LDLCALC", "TRIG", "CHOLHDL", "LDLDIRECT" in the last 72 hours. Thyroid Function Tests: No results for input(s): "TSH", "T4TOTAL", "FREET4", "T3FREE", "THYROIDAB" in the last 72 hours. Anemia Panel: No results for input(s): "VITAMINB12", "FOLATE", "FERRITIN", "TIBC", "IRON", "RETICCTPCT" in the last 72 hours. Sepsis Labs: No results for input(s): "PROCALCITON", "LATICACIDVEN" in the last 168 hours.  No results found for this or any previous visit (from the past 240 hour(s)).       Radiology Studies: MR Brain W and Wo Contrast  Result Date: 10/08/2022 CLINICAL DATA:  Initial evaluation for neuro deficit, stroke suspected. EXAM: MRI HEAD WITHOUT AND WITH CONTRAST TECHNIQUE: Multiplanar, multiecho pulse sequences of the brain and surrounding structures were obtained without and with intravenous contrast. CONTRAST:  5mL GADAVIST GADOBUTROL 1 MMOL/ML IV SOLN COMPARISON:  Prior study from 06/08/2021. FINDINGS: Brain: Mild age-related cerebral atrophy. Extensive cerebral white matter disease, consistent with history of severe chronic demyelinating disease. No abnormal foci of restricted diffusion or enhancement to suggest active demyelination. No evidence for acute or subacute infarct. Gray-white matter differentiation maintained. No areas of chronic cortical infarction. No acute or chronic intracranial blood products. No mass lesion, midline shift or mass effect. No hydrocephalus or extra-axial fluid  collection. Pituitary gland within normal limits for age and gender. Suprasellar region normal. No other abnormal enhancement. Vascular: Major intracranial vascular flow voids are maintained. Skull and upper cervical spine: Cranial junction with normal limits. Diffuse loss of normal bone marrow signal, nonspecific but can be seen with anemia, smoking, obesity, and infiltrative/myelofibrotic marrow processes. No focal marrow replacing lesion. No scalp soft tissue abnormality. Sinuses/Orbits: Globes orbital soft tissues within normal limits. Paranasal sinuses are clear. No significant mastoid effusion. Other: None. IMPRESSION: 1. No acute intracranial abnormality. 2. Extensive cerebral white matter disease, consistent with history of severe chronic demyelinating disease. No evidence for active demyelination. Electronically Signed   By: Rise Mu M.D.   On: 10/08/2022 21:09   MR Cervical Spine W and Wo Contrast  Result Date: 10/08/2022 CLINICAL DATA:  Acute myelopathy of the cervical spine. Neuro deficit. Stroke suspected. Reported history of multiple sclerosis. EXAM: MRI CERVICAL SPINE WITHOUT AND WITH CONTRAST TECHNIQUE: Multiplanar and multiecho pulse sequences of the cervical spine, to include the craniocervical junction and cervicothoracic junction, were obtained without and with intravenous contrast. CONTRAST:  5mL GADAVIST GADOBUTROL 1 MMOL/ML IV SOLN COMPARISON:  MRI cervical spine 06/08/2021 FINDINGS: Alignment: No sagittal spondylolisthesis. Vertebrae: Vertebral body heights are maintained. Normal marrow signal.  Disc spaces are preserved. Cord: No significant change in diffuse patchy increased T2/STIR signal throughout the entire visualized cervical cord. Again no abnormal cord enhancement is visualized. The cord is normal in caliber. Posterior Fossa, vertebral arteries, paraspinal tissues: Negative. Disc levels: No posterior disc bulge, central canal narrowing, or neuroforaminal stenosis.  IMPRESSION: No significant change in diffuse patchy increased T2/STIR signal throughout the entire visualized cervical cord consistent with stated history of multiple sclerosis. No abnormal cord enhancement. Electronically Signed   By: Neita Garnet M.D.   On: 10/08/2022 20:50   DG Chest Port 1 View  Result Date: 10/08/2022 CLINICAL DATA:  Weakness EXAM: PORTABLE CHEST 1 VIEW COMPARISON:  06/17/2021, 05/16/2021 FINDINGS: Rotation limits the exam. No focal airspace disease or effusion. Stable cardiomediastinal silhouette. No pneumothorax. IMPRESSION: No active disease. Rotation limits the exam. Electronically Signed   By: Jasmine Pang M.D.   On: 10/08/2022 15:44        Scheduled Meds:  enoxaparin (LOVENOX) injection  30 mg Subcutaneous Q24H   Continuous Infusions:  cefTRIAXone (ROCEPHIN)  IV       LOS: 1 day    Time spent: 35 minutes    [place] Hoover Brunette, DO [facility]  If 7PM-7AM, please contact night-coverage [email or web address] 10/09/2022, 7:02 AM

## 2022-10-10 DIAGNOSIS — R29898 Other symptoms and signs involving the musculoskeletal system: Secondary | ICD-10-CM | POA: Diagnosis not present

## 2022-10-10 LAB — CBC
HCT: 30.9 % — ABNORMAL LOW (ref 36.0–46.0)
Hemoglobin: 10.1 g/dL — ABNORMAL LOW (ref 12.0–15.0)
MCH: 27.5 pg (ref 26.0–34.0)
MCHC: 32.7 g/dL (ref 30.0–36.0)
MCV: 84.2 fL (ref 80.0–100.0)
Platelets: 178 10*3/uL (ref 150–400)
RBC: 3.67 MIL/uL — ABNORMAL LOW (ref 3.87–5.11)
RDW: 15 % (ref 11.5–15.5)
WBC: 4.2 10*3/uL (ref 4.0–10.5)
nRBC: 0 % (ref 0.0–0.2)

## 2022-10-10 LAB — BASIC METABOLIC PANEL
Anion gap: 5 (ref 5–15)
BUN: 12 mg/dL (ref 6–20)
CO2: 22 mmol/L (ref 22–32)
Calcium: 7.8 mg/dL — ABNORMAL LOW (ref 8.9–10.3)
Chloride: 107 mmol/L (ref 98–111)
Creatinine, Ser: 0.64 mg/dL (ref 0.44–1.00)
GFR, Estimated: >60 mL/min (ref >60)
Glucose, Bld: 90 mg/dL (ref 70–99)
Potassium: 4.4 mmol/L (ref 3.5–5.1)
Sodium: 134 mmol/L — ABNORMAL LOW (ref 135–145)

## 2022-10-10 LAB — MAGNESIUM: Magnesium: 1.9 mg/dL (ref 1.7–2.4)

## 2022-10-10 MED ORDER — FOSFOMYCIN TROMETHAMINE 3 G PO PACK
3.0000 g | PACK | Freq: Once | ORAL | Status: AC
  Administered 2022-10-10: 3 g via ORAL
  Filled 2022-10-10: qty 3

## 2022-10-10 NOTE — Discharge Summary (Signed)
Physician Discharge Summary  Melissa West ZOX:[id number] DOB: 1990-07-14 DOA: 10/08/2022  PCP: Patient, No Pcp Per  Admit date: 10/08/2022  Discharge date: 10/10/2022  Admitted From:Home  Disposition:  Home  Recommendations for Outpatient Follow-up:  Follow up with PCP in 1-2 weeks Continue home medications as prior Given dose of fosfomycin prior to discharge, no further need for antibiotics  Home Health: Continue as prior  Equipment/Devices: Has home wheelchair  Discharge Condition:Stable  CODE STATUS: Full  Diet recommendation: Heart Healthy  Brief/Interim Summary: Melissa West is a 32 y.o. female with medical history significant of multiple sclerosis, chronic urinary retention requiring suprapubic Foley catheter, wheelchair-bound at baseline who presents to the emergency department due to bilateral upper extremity weakness.  Patient was admitted for concern for acute CVA/MS flare, but MRI does not show any acute findings and no further recommendations for IV steroids or other management per neurology.  She is essentially at baseline and was noted to have UTI and was started on Rocephin empirically.  She denied any significant symptomatology and cultures were pending.  She will be given a dose of fosfomycin today and is stable for discharge with no other acute needs or concerns noted.  Discharge Diagnoses:  Principal Problem:   Upper extremity weakness Active Problems:   MS (multiple sclerosis) (HCC)   UTI (urinary tract infection)   Hypokalemia  Principal discharge diagnosis: Acute UTI in the setting of suprapubic catheter.  Discharge Instructions  Discharge Instructions     Diet - low sodium heart healthy   Complete by: As directed    Increase activity slowly   Complete by: As directed       Allergies as of 10/10/2022   No Known Allergies      Medication List     STOP taking these medications    doxycycline 100 MG capsule Commonly known as:  VIBRAMYCIN       TAKE these medications    50-60CC SYRINGE 60 ML Misc Flush 30-50cc of sterile water into catheter daily as needed   acetaminophen 325 MG tablet Commonly known as: TYLENOL Take 2 tablets (650 mg total) by mouth every 6 (six) hours as needed for mild pain (or Fever >/= 101).   acetic acid 0.25 % irrigation Irrigate with as directed daily. Irrigate 60 daily   AMBULATORY NON FORMULARY MEDICATION 30 mLs by Intracatheter route daily as needed. Medication Name: Sodium Chloride 0.9%   AMBULATORY NON FORMULARY MEDICATION Medication Name: Bedside catheter bag   Kesimpta 20 MG/0.4ML Soaj Generic drug: Ofatumumab Inject 20 mg into the skin every 30 (thirty) days.   sterile water for irrigation Irrigate with 50 mLs as directed daily. Irrigate catheter daily with 50cc of sterile water.        No Known Allergies  Consultations: None   Procedures/Studies: MR Brain W and Wo Contrast  Result Date: 10/08/2022 CLINICAL DATA:  Initial evaluation for neuro deficit, stroke suspected. EXAM: MRI HEAD WITHOUT AND WITH CONTRAST TECHNIQUE: Multiplanar, multiecho pulse sequences of the brain and surrounding structures were obtained without and with intravenous contrast. CONTRAST:  5mL GADAVIST GADOBUTROL 1 MMOL/ML IV SOLN COMPARISON:  Prior study from 06/08/2021. FINDINGS: Brain: Mild age-related cerebral atrophy. Extensive cerebral white matter disease, consistent with history of severe chronic demyelinating disease. No abnormal foci of restricted diffusion or enhancement to suggest active demyelination. No evidence for acute or subacute infarct. Gray-white matter differentiation maintained. No areas of chronic cortical infarction. No acute or chronic intracranial blood products. No mass lesion, midline shift  or mass effect. No hydrocephalus or extra-axial fluid collection. Pituitary gland within normal limits for age and gender. Suprasellar region normal. No other abnormal  enhancement. Vascular: Major intracranial vascular flow voids are maintained. Skull and upper cervical spine: Cranial junction with normal limits. Diffuse loss of normal bone marrow signal, nonspecific but can be seen with anemia, smoking, obesity, and infiltrative/myelofibrotic marrow processes. No focal marrow replacing lesion. No scalp soft tissue abnormality. Sinuses/Orbits: Globes orbital soft tissues within normal limits. Paranasal sinuses are clear. No significant mastoid effusion. Other: None. IMPRESSION: 1. No acute intracranial abnormality. 2. Extensive cerebral white matter disease, consistent with history of severe chronic demyelinating disease. No evidence for active demyelination. Electronically Signed   By: Rise Mu M.D.   On: 10/08/2022 21:09   MR Cervical Spine W and Wo Contrast  Result Date: 10/08/2022 CLINICAL DATA:  Acute myelopathy of the cervical spine. Neuro deficit. Stroke suspected. Reported history of multiple sclerosis. EXAM: MRI CERVICAL SPINE WITHOUT AND WITH CONTRAST TECHNIQUE: Multiplanar and multiecho pulse sequences of the cervical spine, to include the craniocervical junction and cervicothoracic junction, were obtained without and with intravenous contrast. CONTRAST:  5mL GADAVIST GADOBUTROL 1 MMOL/ML IV SOLN COMPARISON:  MRI cervical spine 06/08/2021 FINDINGS: Alignment: No sagittal spondylolisthesis. Vertebrae: Vertebral body heights are maintained. Normal marrow signal. Disc spaces are preserved. Cord: No significant change in diffuse patchy increased T2/STIR signal throughout the entire visualized cervical cord. Again no abnormal cord enhancement is visualized. The cord is normal in caliber. Posterior Fossa, vertebral arteries, paraspinal tissues: Negative. Disc levels: No posterior disc bulge, central canal narrowing, or neuroforaminal stenosis. IMPRESSION: No significant change in diffuse patchy increased T2/STIR signal throughout the entire visualized cervical  cord consistent with stated history of multiple sclerosis. No abnormal cord enhancement. Electronically Signed   By: Neita Garnet M.D.   On: 10/08/2022 20:50   DG Chest Port 1 View  Result Date: 10/08/2022 CLINICAL DATA:  Weakness EXAM: PORTABLE CHEST 1 VIEW COMPARISON:  06/17/2021, 05/16/2021 FINDINGS: Rotation limits the exam. No focal airspace disease or effusion. Stable cardiomediastinal silhouette. No pneumothorax. IMPRESSION: No active disease. Rotation limits the exam. Electronically Signed   By: Jasmine Pang M.D.   On: 10/08/2022 15:44     Discharge Exam: Vitals:   10/09/22 2250 10/10/22 0437  BP: (!) 88/57 94/68  Pulse:  82  Resp:  18  Temp:  99.7 F (37.6 C)  SpO2:  99%   Vitals:   10/09/22 1220 10/09/22 2045 10/09/22 2250 10/10/22 0437  BP: 99/68 (!) 86/61 (!) 88/57 94/68  Pulse: 91 92  82  Resp: 16 18  18   Temp: 98.8 F (37.1 C) 98.5 F (36.9 C)  99.7 F (37.6 C)  TempSrc: Oral Oral  Oral  SpO2: 100% 100%  99%  Weight:      Height:        General: Pt is alert, awake, not in acute distress, bedbound Cardiovascular: RRR, S1/S2 +, no rubs, no gallops Respiratory: CTA bilaterally, no wheezing, no rhonchi Abdominal: Soft, NT, ND, bowel sounds + Extremities: no edema, no cyanosis Suprapubic catheter C/D/I    The results of significant diagnostics from this hospitalization (including imaging, microbiology, ancillary and laboratory) are listed below for reference.     Microbiology: Recent Results (from the past 240 hour(s))  Urine Culture     Status: None (Preliminary result)   Collection Time: 10/08/22  7:49 PM   Specimen: Urine, Random  Result Value Ref Range Status   Specimen Description  Final    URINE, RANDOM Performed at [facility], [address], [place], [place] [address]    Special Requests   Final    URINE, RANDOM Performed at [facility], [address], [place], [place] [address]    Culture PENDING  Incomplete   Report Status  PENDING  Incomplete     Labs: BNP (last 3 results) No results for input(s): "BNP" in the last 8760 hours. Basic Metabolic Panel: Recent Labs  Lab 10/08/22 1546 10/09/22 0531 10/10/22 0450  NA 138 135 134*  K 3.3* 3.4* 4.4  CL 106 105 107  CO2 24 22 22   GLUCOSE 102* 87 90  BUN 10 10 12   CREATININE 0.73 0.65 0.64  CALCIUM 8.4* 8.2* 7.8*  MG  --  1.6* 1.9  PHOS  --  2.2*  --    Liver Function Tests: Recent Labs  Lab 10/09/22 0531  AST 17  ALT 15  ALKPHOS 48  BILITOT 0.6  PROT 6.4*  ALBUMIN 2.7*   No results for input(s): "LIPASE", "AMYLASE" in the last 168 hours. No results for input(s): "AMMONIA" in the last 168 hours. CBC: Recent Labs  Lab 10/08/22 1546 10/09/22 0531 10/10/22 0450  WBC 7.4 6.7 4.2  NEUTROABS 6.1  --   --   HGB 10.9* 9.7* 10.1*  HCT 33.7* 29.7* 30.9*  MCV 85.3 84.6 84.2  PLT 189 184 178   Cardiac Enzymes: No results for input(s): "CKTOTAL", "CKMB", "CKMBINDEX", "TROPONINI" in the last 168 hours. BNP: Invalid input(s): "POCBNP" CBG: No results for input(s): "GLUCAP" in the last 168 hours. D-Dimer No results for input(s): "DDIMER" in the last 72 hours. Hgb A1c No results for input(s): "HGBA1C" in the last 72 hours. Lipid Profile No results for input(s): "CHOL", "HDL", "LDLCALC", "TRIG", "CHOLHDL", "LDLDIRECT" in the last 72 hours. Thyroid function studies No results for input(s): "TSH", "T4TOTAL", "T3FREE", "THYROIDAB" in the last 72 hours.  Invalid input(s): "FREET3" Anemia work up No results for input(s): "VITAMINB12", "FOLATE", "FERRITIN", "TIBC", "IRON", "RETICCTPCT" in the last 72 hours. Urinalysis    Component Value Date/Time   COLORURINE YELLOW 10/08/2022 1949   APPEARANCEUR CLOUDY (A) 10/08/2022 1949   LABSPEC 1.012 10/08/2022 1949   PHURINE 6.0 10/08/2022 1949   GLUCOSEU NEGATIVE 10/08/2022 1949   HGBUR MODERATE (A) 10/08/2022 1949   BILIRUBINUR NEGATIVE 10/08/2022 1949   KETONESUR NEGATIVE 10/08/2022 1949   PROTEINUR  100 (A) 10/08/2022 1949   UROBILINOGEN 0.2 06/13/2009 1128   NITRITE POSITIVE (A) 10/08/2022 1949   LEUKOCYTESUR LARGE (A) 10/08/2022 1949   Sepsis Labs Recent Labs  Lab 10/08/22 1546 10/09/22 0531 10/10/22 0450  WBC 7.4 6.7 4.2   Microbiology Recent Results (from the past 240 hour(s))  Urine Culture     Status: None (Preliminary result)   Collection Time: 10/08/22  7:49 PM   Specimen: Urine, Random  Result Value Ref Range Status   Specimen Description   Final    URINE, RANDOM Performed at [facility], [address] [place], [place] [address]    Special Requests   Final    URINE, RANDOM Performed at [facility], [address] [place], [place] [address]    Culture PENDING  Incomplete   Report Status PENDING  Incomplete     Time coordinating discharge: 35 minutes  SIGNED:   Erick Blinks, DO [facility] 10/10/2022, 9:45 AM  If 7PM-7AM, please contact night-coverage [email or web address]

## 2022-10-10 NOTE — Progress Notes (Signed)
Patient BP was low last night, on-call notified, 500 mL NS bolus order and was given. We continue to monitor.

## 2022-10-10 NOTE — TOC Transition Note (Addendum)
Transition of Care [facility]) - CM/SW Discharge Note   Patient Details  Name: Melissa West MRN: [id number] Date of Birth: 1991-03-23  [facility] CM/SW Contact:  Carmina Miller, LCSWA Phone Number: 10/10/2022, 11:09 AM   Clinical Narrative:    Update-12:22 PM-Per RN Merry Proud pt has secured family to transport her home, no longer needs EMS transport. CSW canceled EMS transport.   Wheelchair transportation through [employer] initially set up for pt, however pt states she has no one at home to assist her with getting in the home, EMS transport set up instead, pt advised she may incur cost should EMS feel this ride was not medically needed, pt verbalized understanding.         Patient Goals and CMS Choice      Discharge Placement                         Discharge Plan and Services Additional resources added to the After Visit Summary for                                       Social Determinants of Health (SDOH) Interventions SDOH Screenings   Food Insecurity: No Food Insecurity (10/09/2022)  Housing: Low Risk  (10/09/2022)  Transportation Needs: No Transportation Needs (10/09/2022)  Utilities: Not At Risk (10/09/2022)  Tobacco Use: Low Risk  (10/09/2022)     Readmission Risk Interventions    11/06/2021    1:05 PM 05/20/2021   10:58 AM 03/11/2021    2:08 PM  Readmission Risk Prevention Plan  Post Dischage Appt   Complete  Medication Screening   Complete  Transportation Screening Complete  Complete  Home Care Screening  Complete   Medication Review (RN CM)  Complete   Medication Review (RN Care Manager) Complete    PCP or Specialist appointment within 3-5 days of discharge Not Complete    HRI or Home Care Consult Complete    SW Recovery Care/Counseling Consult Complete    Palliative Care Screening Not Applicable    Skilled Nursing Facility Not Applicable

## 2022-10-10 NOTE — Progress Notes (Signed)
IV removed and pt now dressed. Family member arrived to transport patient home.

## 2022-10-11 LAB — URINE CULTURE

## 2022-10-16 NOTE — Progress Notes (Unsigned)
[facility]  PATIENT: Melissa West DOB: January 24, 1991  REFERRING DOCTOR OR PCP:  Nita Sells SOURCE: Patient, notes from primary care, notes from [facility] neurology, multiple MRI reports,[id number]  _________________________________   HISTORICAL  CHIEF COMPLAINT:  No chief complaint on file.   HISTORY OF PRESENT ILLNESS:   Melissa West is a 32 year old woman with relapsing remitting multiple sclerosis.     Update 09-Nov-1988  She was recently hospitalized on 10/08/2022 due to increased weakness and was found to have a urinary tract infection.  She was given IV antibiotics.  During the stay she also had MRI of the head and cervical spine 10/08/2022.  They show the old MS plaques but there were no new ones.  There was no abnormal enhancement.  For her disease modifying therapy, she has been on Kesimpta since 2023.  Before that she was on Tysabri until she did very well but had difficulty with travel.  She then tried Mayzent but felt she had done better on other medications.  Therefore, she was switched to Kesimpta.    She has bilateral leg weakness and spasticity and is wheelchair bound.   The left leg gets phasic spasms easily.   Movements can increase spasticity.   She denies numbness.    She has a catheter now for the bladder and is seeing urology again soon.     She notes decreased vision much worse on right.    Colors are desaturated OD.  She has a dysconjugate gaze.  The ataxia makes it difficult for her to write.  The ataxia is worse on the right and fortunately she is left-handed.  She feels cogniton is the same.  Her mom feels she is more forgetful than she was previously.   MS history She was diagnosed with MS around 2009..  At age 24, she had some falls due to poor balance not weakness.   A few months later, she had MRI's performed consistent with MS.  She was placed on Betaseron initially.    She did well for a while and was walking without an aid.   She returned to work.    She thinks she stopped the Betaseron and then had more symptoms including poor gait and diplopia.   She started Tysabri but stopped it during pregnancy.   She had a period of time (2013-2014) without any medications (she was still doing relatively well then).    She had more trouble with her right leg with weakness and spasticity and returned to [facility] (Dr. Renne Crigler) in 2014.   She was placed back on Tysabri and stayed on until October 2016.   JCV Ab was 0.23 indeterminate 12/2014.    She then stopped the Tysabri and started to see Dr. Gerilyn Pilgrim.   She started Tecfidera but she thinks she was worsening.  In 2018, she had a positive JCV Ab titer (0.44) and a decision was made to swtich to Ocrevus.    About 2 years ago, she went on Ocrevus (thinks only 2 doses).  She hasn't seen any neurologist since and she feels she has progressed a lot.   She was placed back on Tysabri in 2020 but stopped in 2021 due to poor access issues and difficulty with travel.  She then was on Mayzent but stopped that for an unknown reason in 2022.  Imaging:  CT scan of the head 07/02/2012 showed white matter lesions consistent with MS that were not present in 2009.  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  MRI of the lumbar spine 10/31/2012 showed demyelinating plaque in the lower thoracic spinal cord and conus medullaris.  MRI report 12/28/2013 of the brain (compared to 2011 )states: Interval disease progression indicated by increased number of innumerable T2 hyperintense lesions within the periventricular and subcortical white matter involving both cerebral hemispheres, with additional foci in the cerebellum and brainstem. No enhancing lesions or restricted diffusion.  MRI cervical spine report 04/12/2013 (compared to 2011) shows: Extensive signal abnormality within the brainstem and cervical spinal cord in keeping with multiple sclerosis. There has been interval progression of  disease within the brainstem and possibly at C3-4 within the spinal cord. No enhancement seen on today's exam.  MRI brain 02/02/2019 shows multiple T2/flair hyperintense foci in the cerebellum, brainstem and cerebral hemispheres in a pattern and configuration consistent with chronic demyelinating plaque associated with multiple sclerosis.  None of the foci enhances or appears to be acute. .   Absence of the septum pellucidum, reduced size of the optic nerves and reduced size of the corpus callosum.  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  T2 hyperintense foci scattered within the spinal cord at the cervicomedullary junction, and from C2 through C4 and C6-C7. Marland Kitchen   There were no significant degenerative changes  MRI of the brain and cervical spine 06/08/2020 was unchanged compared to previous MRI.  MRI of the brain 10/08/2022 showed no new lesions compared to the 06/08/2021 MRI.  MRI of the cervical spine 10/08/2022 showed no new lesions compared to the 06/08/2021 MRI.  REVIEW OF SYSTEMS: Constitutional: No fevers, chills, sweats, or change in appetite.  She has excessive sleepiness.   Eyes: No visual changes, double vision, eye pain Ear, nose and throat: No hearing loss, ear pain, nasal congestion, sore throat Cardiovascular: No chest pain, palpitations Respiratory:  No shortness of breath at rest or with exertion.   She snores but reports that a sleep study did not show sleep apnea.   GastrointestinaI: No nausea, vomiting, diarrhea, abdominal pain, fecal incontinence Genitourinary:  No dysuria, urinary retention or frequency.  No nocturia. Musculoskeletal:  No neck pain, back pain Integumentary: No rash, pruritus, skin lesions Neurological: as above Psychiatric: No depression at this time.  No anxiety Endocrine: No palpitations, diaphoresis, change in appetite, change in  weigh or increased thirst Hematologic/Lymphatic:  No anemia, purpura, petechiae. Allergic/Immunologic: No itchy/runny eyes, nasal congestion, recent allergic reactions, rashes  ALLERGIES: No Known Allergies  HOME MEDICATIONS:  Current Outpatient Medications:    acetaminophen (TYLENOL) 325 MG tablet, Take 2 tablets (650 mg total) by mouth every 6 (six) hours as needed for mild pain (or Fever >/= 101)., Disp: 12 tablet, Rfl: 0   acetic acid 0.25 % irrigation, Irrigate with as directed daily. Irrigate 60 daily, Disp: 1000 mL, Rfl: 12   AMBULATORY NON FORMULARY MEDICATION, 30 mLs by Intracatheter route daily as needed. Medication Name: Sodium Chloride 0.9%, Disp: 900 mL, Rfl: 11   AMBULATORY NON FORMULARY MEDICATION, Medication Name: Bedside catheter bag, Disp: 2 Bag, Rfl: 12   KESIMPTA 20 MG/0.4ML SOAJ, Inject 20 mg into the skin every 30 (thirty) days., Disp: , Rfl:    Syringe, Disposable, (50-60CC SYRINGE) 60 ML MISC, Flush 30-50cc of sterile water into catheter daily as needed, Disp: 30 each, Rfl: 11   Water For Irrigation, Sterile (STERILE WATER FOR IRRIGATION), Irrigate with 50  mLs as directed daily. Irrigate catheter daily with 50cc of sterile water., Disp: 1000 mL, Rfl: 15  PAST MEDICAL HISTORY: Past Medical History:  Diagnosis Date   MS (multiple sclerosis) (HCC)    No pertinent past medical history     PAST SURGICAL HISTORY: Past Surgical History:  Procedure Laterality Date   CESAREAN SECTION  08/09/2011   Procedure: CESAREAN SECTION;  Surgeon: Lesly Dukes, MD;  Location: [facility];  Service: Gynecology;  Laterality: N/A;   IR CATHETER TUBE CHANGE  07/22/2021   IR CATHETER TUBE CHANGE  09/10/2021    FAMILY HISTORY: Family History  Problem Relation Age of Onset   Diabetes Maternal Grandmother    Hypertension Maternal Grandmother    Kidney disease Maternal Grandmother     SOCIAL HISTORY:  Social History   Socioeconomic History   Marital status: Single    Spouse name:  Not on file   Number of children: Not on file   Years of education: Not on file   Highest education level: Not on file  Occupational History   Not on file  Tobacco Use   Smoking status: Never   Smokeless tobacco: Never  Vaping Use   Vaping Use: Never used  Substance and Sexual Activity   Alcohol use: No   Drug use: No   Sexual activity: Yes    Birth control/protection: None  Other Topics Concern   Not on file  Social History Narrative   Left handed    Lives with kid   Caffeine use: none   Social Determinants of Health   Financial Resource Strain: Not on file  Food Insecurity: No Food Insecurity (10/09/2022)   Hunger Vital Sign    Worried About Running Out of Food in the Last Year: Never true    Ran Out of Food in the Last Year: Never true  Transportation Needs: No Transportation Needs (10/09/2022)   PRAPARE - Administrator, Civil Service (Medical): No    Lack of Transportation (Non-Medical): No  Physical Activity: Not on file  Stress: Not on file  Social Connections: Not on file  Intimate Partner Violence: Not At Risk (10/09/2022)   Humiliation, Afraid, Rape, and Kick questionnaire    Fear of Current or Ex-Partner: No    Emotionally Abused: No    Physically Abused: No    Sexually Abused: No     PHYSICAL EXAM  There were no vitals filed for this visit.   There is no height or weight on file to calculate BMI.   General: The patient is well-developed and well-nourished and in no acute distress  HEENT:  Head is [place]/AT.  Sclera are anicteric.    Skin: Extremities are without rash or  edema.  Musculoskeletal:  Back is nontender  Neurologic Exam  Mental status: The patient is alert and oriented x 3 at the time of the examination. The patient has apparent normal recent and remote memory, with an apparently normal attention span and concentration ability.   Speech is normal.  Cranial nerves:there is disconjugate gaze with left exotropia..  She has  nystagmus with all gazes, worse on left gaze..   There is good facial sensation to soft touch bilaterally.Facial strength is normal.  Trapezius and sternocleidomastoid strength is normal.  She has mild dysarthria is noted.   No obvious hearing deficits are noted.  Motor:  Muscle bulk is normal.   Tone is increased in the legs.  Strength was fairly normal in the arms though rapid  rotating movements were reduced.  Strength was 2- /5 in the legs.  Sensory: Sensory testing is intact to pinprick, soft touch and vibration sensation in the arms but reduced vibration in the legs.  Coordination: She has ataxic finger-nose-finger bilaterally, right worse than left.  She cannot do heel-to-shin.  Gait and station: She is wheelchair-bound and cannot stand even with strong support.     Reflexes: Deep tendon reflexes are increased.  In the legs, there is spread at the knees and clonus at the ankles.  Plantar responses are extensor.      ASSESSMENT AND PLAN  No diagnosis found.    1.   We discussed different treatment options.  She feels she did best on Tysabri they will access issues and travel were issues.  We will check some lab work and she will choose between Tysabri and Kesimpta 2.    Continue other medications. 3.    rtc 4 months, sooner if new pr worsening issues  42-minute office visit with the majority of the time spent face-to-face for history and physical, discussion/counseling and decision-making.  Additional time with record review and documentation.   Denny Lave A. Epimenio Foot, MD, [facility] 10/16/2022, 6:23 PM Certified in Neurology, Clinical Neurophysiology, Sleep Medicine and Neuroimaging  [facility] [address], [address] [place], [place] [address] [phone]

## 2022-10-18 ENCOUNTER — Encounter: Payer: Self-pay | Admitting: Neurology

## 2022-10-18 ENCOUNTER — Ambulatory Visit (INDEPENDENT_AMBULATORY_CARE_PROVIDER_SITE_OTHER): Payer: 59 | Admitting: Neurology

## 2022-10-18 ENCOUNTER — Telehealth: Payer: Self-pay | Admitting: Neurology

## 2022-10-18 VITALS — BP 97/67 | HR 73 | Ht 62.0 in

## 2022-10-18 DIAGNOSIS — N3941 Urge incontinence: Secondary | ICD-10-CM

## 2022-10-18 DIAGNOSIS — H5500 Unspecified nystagmus: Secondary | ICD-10-CM | POA: Diagnosis not present

## 2022-10-18 DIAGNOSIS — Z993 Dependence on wheelchair: Secondary | ICD-10-CM | POA: Diagnosis not present

## 2022-10-18 DIAGNOSIS — G35 Multiple sclerosis: Secondary | ICD-10-CM

## 2022-10-18 DIAGNOSIS — Z79899 Other long term (current) drug therapy: Secondary | ICD-10-CM

## 2022-10-18 DIAGNOSIS — R29898 Other symptoms and signs involving the musculoskeletal system: Secondary | ICD-10-CM

## 2022-10-18 DIAGNOSIS — R252 Cramp and spasm: Secondary | ICD-10-CM | POA: Diagnosis not present

## 2022-10-18 DIAGNOSIS — F09 Unspecified mental disorder due to known physiological condition: Secondary | ICD-10-CM

## 2022-10-18 MED ORDER — BACLOFEN 10 MG PO TABS: 10.0000 mg | ORAL_TABLET | Freq: Three times a day (TID) | ORAL | 11 refills | Status: DC

## 2022-10-18 NOTE — Telephone Encounter (Signed)
Referral for Physical Therapy sent through [facility] to [facility], [place], CHLOE N

## 2022-10-19 LAB — CBC WITH DIFFERENTIAL/PLATELET
Basophils Absolute: 0 10*3/uL (ref 0.0–0.2)
Basos: 1 %
EOS (ABSOLUTE): 0.1 10*3/uL (ref 0.0–0.4)
Eos: 1 %
Hematocrit: 35.6 % (ref 34.0–46.6)
Hemoglobin: 11.1 g/dL (ref 11.1–15.9)
Immature Grans (Abs): 0 10*3/uL (ref 0.0–0.1)
Immature Granulocytes: 0 %
Lymphocytes Absolute: 1.6 10*3/uL (ref 0.7–3.1)
Lymphs: 45 %
MCH: 27.1 pg (ref 26.6–33.0)
MCHC: 31.2 g/dL — ABNORMAL LOW (ref 31.5–35.7)
MCV: 87 fL (ref 79–97)
Monocytes Absolute: 0.2 10*3/uL (ref 0.1–0.9)
Monocytes: 6 %
Neutrophils Absolute: 1.7 10*3/uL (ref 1.4–7.0)
Neutrophils: 47 %
Platelets: 321 10*3/uL (ref 150–450)
RBC: 4.1 x10E6/uL (ref 3.77–5.28)
RDW: 14.3 % (ref 11.7–15.4)
WBC: 3.6 10*3/uL (ref 3.4–10.8)

## 2022-10-19 LAB — IGG, IGA, IGM
IgA/Immunoglobulin A, Serum: 611 mg/dL — ABNORMAL HIGH (ref 87–352)
IgG (Immunoglobin G), Serum: 1601 mg/dL (ref 586–1602)
IgM (Immunoglobulin M), Srm: 58 mg/dL (ref 26–217)

## 2022-11-04 ENCOUNTER — Ambulatory Visit (INDEPENDENT_AMBULATORY_CARE_PROVIDER_SITE_OTHER): Payer: 59

## 2022-11-04 ENCOUNTER — Ambulatory Visit: Payer: 59

## 2022-11-04 DIAGNOSIS — R339 Retention of urine, unspecified: Secondary | ICD-10-CM | POA: Diagnosis not present

## 2022-11-04 NOTE — Progress Notes (Signed)
Suprapubic Cath Change ? ?Patient is present today for a suprapubic catheter change due to urinary retention.  65ml of water was drained from the balloon, a 20FR foley cath was removed from the tract with out difficulty.  Site was cleaned and prepped in a sterile fashion with betadine.  A 20FR foley cath was replaced into the tract no complications were noted. Urine return was noted, 10 ml of sterile water was inflated into the balloon and a leg bag was attached for drainage.  Patient tolerated well. A night bag was given to patient and proper instruction was given on how to switch bags.   ? ?Performed by: Guss Bunde, CMA ? ?Follow up: Follow up as scheduled.   ?

## 2022-12-06 ENCOUNTER — Ambulatory Visit: Payer: 59

## 2022-12-06 DIAGNOSIS — Z9889 Other specified postprocedural states: Secondary | ICD-10-CM

## 2022-12-06 DIAGNOSIS — R339 Retention of urine, unspecified: Secondary | ICD-10-CM

## 2022-12-06 NOTE — Progress Notes (Cosign Needed Addendum)
Suprapubic Cath Change  Patient is present today for a suprapubic catheter change due to urinary retention.  10 ml of water was drained from the balloon, a 20 FR foley cath was removed from the tract with out difficulty.  Site was cleaned and prepped in a sterile fashion with betadine.  A 20 FR foley cath was replaced into the tract no complications were noted. Flush return was noted, 10 ml of sterile water was inflated into the balloon and a leg bag was attached for drainage.  Patient tolerated well. A night bag was given to patient and proper instruction was given on how to switch bags.    Performed by: Kennyth Lose, CMA  Follow up: No follow-ups on file.

## 2022-12-14 ENCOUNTER — Other Ambulatory Visit: Payer: Self-pay

## 2022-12-14 ENCOUNTER — Emergency Department (HOSPITAL_COMMUNITY)
Admission: EM | Admit: 2022-12-14 | Discharge: 2022-12-14 | Disposition: A | Discharge status: Home / Self Care | Payer: 59 | Attending: Emergency Medicine | Admitting: Emergency Medicine

## 2022-12-14 DIAGNOSIS — T83098A Other mechanical complication of other indwelling urethral catheter, initial encounter: Secondary | ICD-10-CM | POA: Diagnosis not present

## 2022-12-14 DIAGNOSIS — T83091A Other mechanical complication of indwelling urethral catheter, initial encounter: Secondary | ICD-10-CM | POA: Diagnosis not present

## 2022-12-14 DIAGNOSIS — R5381 Other malaise: Secondary | ICD-10-CM | POA: Diagnosis not present

## 2022-12-14 DIAGNOSIS — T82898A Other specified complication of vascular prosthetic devices, implants and grafts, initial encounter: Secondary | ICD-10-CM | POA: Diagnosis not present

## 2022-12-14 DIAGNOSIS — R82998 Other abnormal findings in urine: Secondary | ICD-10-CM | POA: Diagnosis not present

## 2022-12-14 DIAGNOSIS — Z743 Need for continuous supervision: Secondary | ICD-10-CM | POA: Diagnosis not present

## 2022-12-14 DIAGNOSIS — Z7401 Bed confinement status: Secondary | ICD-10-CM | POA: Diagnosis not present

## 2022-12-14 DIAGNOSIS — R531 Weakness: Secondary | ICD-10-CM | POA: Diagnosis not present

## 2022-12-14 DIAGNOSIS — T839XXA Unspecified complication of genitourinary prosthetic device, implant and graft, initial encounter: Secondary | ICD-10-CM

## 2022-12-14 LAB — BASIC METABOLIC PANEL
Anion gap: 5 (ref 5–15)
BUN: 16 mg/dL (ref 6–20)
CO2: 25 mmol/L (ref 22–32)
Calcium: 8.4 mg/dL — ABNORMAL LOW (ref 8.9–10.3)
Chloride: 105 mmol/L (ref 98–111)
Creatinine, Ser: 0.61 mg/dL (ref 0.44–1.00)
GFR, Estimated: >60 mL/min (ref >60)
Glucose, Bld: 81 mg/dL (ref 70–99)
Potassium: 4.1 mmol/L (ref 3.5–5.1)
Sodium: 135 mmol/L (ref 135–145)

## 2022-12-14 NOTE — Discharge Instructions (Addendum)
Follow up with your urologist if you have any further complications, or return here.

## 2022-12-14 NOTE — ED Triage Notes (Signed)
Pt c/o no urine has came out into the bag since last night. Pt requesting cath change.

## 2022-12-14 NOTE — ED Provider Notes (Signed)
[place] EMERGENCY DEPARTMENT AT [facility] Provider Note   [name]: [id number] Arrival date & time: 12/14/22  1052     History  No chief complaint on file.   Melissa West is a 32 y.o. female with a history of MS, wheelchair dependent, has a chronic suprapubic catheter presenting for evaluation of clogged catheter.  She just had her catheter changed on August 26, but notes that her mother emptied her bag last night before bedtime and she woke this morning and the bag was empty.  Mother tried to flush without success.  She is here for a catheter change.  She denies fevers or chills, she does not have bladder sensation, unable to assess bladder fullness.  The history is provided by the patient.       Home Medications Prior to Admission medications   Medication Sig Start Date End Date Taking? Authorizing Provider  acetaminophen (TYLENOL) 325 MG tablet Take 2 tablets (650 mg total) by mouth every 6 (six) hours as needed for mild pain (or Fever >/= 101). 05/20/21   Shon Hale, MD  acetic acid 0.25 % irrigation Irrigate with as directed daily. Irrigate 60 daily 02/03/22   [name], Mardene Celeste, MD  AMBULATORY NON FORMULARY MEDICATION 30 mLs by Intracatheter route daily as needed. Medication Name: Sodium Chloride 0.9% 11/12/21   [name], Mardene Celeste, MD  AMBULATORY NON FORMULARY MEDICATION Medication Name: Bedside catheter bag 08/12/22   Bjorn Pippin, MD  baclofen (LIORESAL) 10 MG tablet Take 1 tablet (10 mg total) by mouth 3 (three) times daily. 10/18/22   Sater, Pearletha Furl, MD  KESIMPTA 20 MG/0.4ML SOAJ Inject 20 mg into the skin every 30 (thirty) days. 06/24/21   [provider]  Syringe, Disposable, (50-60CC SYRINGE) 60 ML MISC Flush 30-50cc of sterile water into catheter daily as needed 01/15/22   [name], Mardene Celeste, MD  Water For Irrigation, Sterile (STERILE WATER FOR IRRIGATION) Irrigate with 50 mLs as directed daily. Irrigate catheter daily with 50cc of sterile water.  01/15/22   [name], Mardene Celeste, MD      Allergies    Patient has no known allergies.    Review of Systems   Review of Systems  Constitutional:  Negative for chills and fever.  HENT: Negative.    Eyes: Negative.   Respiratory:  Negative for chest tightness and shortness of breath.   Cardiovascular:  Negative for chest pain.  Gastrointestinal:  Negative for abdominal pain, nausea and vomiting.  Genitourinary:  Positive for decreased urine volume.  Musculoskeletal: Negative.   Skin: Negative.  Negative for rash and wound.  Neurological: Negative.   Psychiatric/Behavioral: Negative.      Physical Exam Updated Vital Signs BP (!) 102/51   Pulse 62   Temp 98.3 F (36.8 C) (Oral)   Resp 17   Ht 5\' 2"  (1.575 m)   Wt 44.9 kg   SpO2 100%   BMI 18.10 kg/m  Physical Exam Vitals and nursing note reviewed.  Constitutional:      Appearance: She is well-developed.  HENT:     Head: Normocephalic and atraumatic.  Eyes:     Conjunctiva/sclera: Conjunctivae normal.  Cardiovascular:     Rate and Rhythm: Normal rate.  Abdominal:     General: Bowel sounds are normal.     Palpations: Abdomen is soft.     Tenderness: There is no abdominal tenderness. There is no guarding.  Musculoskeletal:     Cervical back: Normal range of motion.     Comments:  Contractures lower extremities.  Skin:    General: Skin is warm and dry.  Neurological:     Mental Status: She is alert.     ED Results / Procedures / Treatments   Labs (all labs ordered are listed, but only abnormal results are displayed) Labs Reviewed  BASIC METABOLIC PANEL - Abnormal; Notable for the following components:      Result Value   Calcium 8.4 (*)    All other components within normal limits    EKG None  Radiology No results found.  Procedures ASPIRATION BLADDER INSERT SUPRAPUBIC CATHETER  Date/Time: 12/14/2022 3:12 PM  Performed by: Burgess Amor, PA-C Authorized by: Burgess Amor, PA-C  Consent: Verbal consent  obtained. Risks and benefits: risks, benefits and alternatives were discussed Consent given by: patient Patient understanding: patient states understanding of the procedure being performed Patient identity confirmed: verbally with patient Time out: Immediately prior to procedure a "time out" was called to verify the correct patient, procedure, equipment, support staff and site/side marked as required. Preparation: Patient was prepped and draped in the usual sterile fashion. Local anesthesia used: no  Anesthesia: Local anesthesia used: no Comments: Suprapubic catheter changed out without complication.  #20FR       Medications Ordered in ED Medications - No data to display  ED Course/ Medical Decision Making/ A&P                                 Medical Decision Making Patient presenting with a presumed COVID Foley catheter despite having it replaced by her urology office just 1 week ago.  We attempted to flush the Foley and it would not flush but urine was leaking out from around the stoma.  She denies fevers.  No abdominal pain, no nausea or vomiting.  Foley catheter was replaced which patient tolerated well.  The catheter was working at time of discharge.  Amount and/or Complexity of Data Reviewed Labs: ordered.           Final Clinical Impression(s) / ED Diagnoses Final diagnoses:  Problem with Foley catheter, initial encounter [facility])    Rx / DC Orders ED Discharge Orders     None         Victoriano Lain 12/14/22 1513    Pricilla Loveless, MD 12/16/22 [phone]

## 2022-12-15 ENCOUNTER — Telehealth: Payer: Self-pay | Admitting: Urology

## 2022-12-15 NOTE — Telephone Encounter (Signed)
     1. Which medications need to be refilled? (please list name of each medication and dose if known  KESIMPTA 20 MG/0.4ML SOAJ [[id number]]       Which pharmacy/location (including street and city if local pharmacy) is medication to be sent to  [facility]

## 2022-12-16 NOTE — Telephone Encounter (Signed)
Patient called back - I let her know that we could not refill this medication she would have to call the prescribing doctor. She said thank you and she will try their office.

## 2022-12-16 NOTE — Telephone Encounter (Signed)
Message left stating the medication was not prescribed from our office and to to reach out the last provider that prescribed it. Name given of last historical provider listed on prescription.

## 2022-12-29 ENCOUNTER — Encounter (HOSPITAL_COMMUNITY): Payer: Self-pay | Admitting: Emergency Medicine

## 2022-12-29 ENCOUNTER — Emergency Department (HOSPITAL_COMMUNITY)
Admission: EM | Admit: 2022-12-29 | Discharge: 2022-12-29 | Disposition: A | Discharge status: Home / Self Care | Payer: 59 | Attending: Emergency Medicine | Admitting: Emergency Medicine

## 2022-12-29 ENCOUNTER — Emergency Department (HOSPITAL_COMMUNITY): Payer: 59

## 2022-12-29 ENCOUNTER — Other Ambulatory Visit: Payer: Self-pay

## 2022-12-29 DIAGNOSIS — R531 Weakness: Secondary | ICD-10-CM | POA: Diagnosis not present

## 2022-12-29 DIAGNOSIS — R5381 Other malaise: Secondary | ICD-10-CM | POA: Diagnosis not present

## 2022-12-29 DIAGNOSIS — Z743 Need for continuous supervision: Secondary | ICD-10-CM | POA: Diagnosis not present

## 2022-12-29 DIAGNOSIS — D649 Anemia, unspecified: Secondary | ICD-10-CM | POA: Insufficient documentation

## 2022-12-29 DIAGNOSIS — Z7401 Bed confinement status: Secondary | ICD-10-CM | POA: Diagnosis not present

## 2022-12-29 DIAGNOSIS — R404 Transient alteration of awareness: Secondary | ICD-10-CM | POA: Diagnosis not present

## 2022-12-29 DIAGNOSIS — R42 Dizziness and giddiness: Secondary | ICD-10-CM

## 2022-12-29 DIAGNOSIS — G4489 Other headache syndrome: Secondary | ICD-10-CM | POA: Diagnosis not present

## 2022-12-29 DIAGNOSIS — R29818 Other symptoms and signs involving the nervous system: Secondary | ICD-10-CM | POA: Diagnosis not present

## 2022-12-29 LAB — COMPREHENSIVE METABOLIC PANEL WITH GFR
ALT: 14 U/L (ref 0–44)
AST: 16 U/L (ref 15–41)
Albumin: 3.1 g/dL — ABNORMAL LOW (ref 3.5–5.0)
Alkaline Phosphatase: 50 U/L (ref 38–126)
Anion gap: 7 (ref 5–15)
BUN: 14 mg/dL (ref 6–20)
CO2: 22 mmol/L (ref 22–32)
Calcium: 8.5 mg/dL — ABNORMAL LOW (ref 8.9–10.3)
Chloride: 110 mmol/L (ref 98–111)
Creatinine, Ser: 0.66 mg/dL (ref 0.44–1.00)
GFR, Estimated: >60 mL/min (ref >60)
Glucose, Bld: 84 mg/dL (ref 70–99)
Potassium: 3.7 mmol/L (ref 3.5–5.1)
Sodium: 139 mmol/L (ref 135–145)
Total Bilirubin: 0.5 mg/dL (ref 0.3–1.2)
Total Protein: 6.9 g/dL (ref 6.5–8.1)

## 2022-12-29 LAB — CBC WITH DIFFERENTIAL/PLATELET
Abs Immature Granulocytes: 0.01 10*3/uL (ref 0.00–0.07)
Basophils Absolute: 0 10*3/uL (ref 0.0–0.1)
Basophils Relative: 1 %
Eosinophils Absolute: 0.1 10*3/uL (ref 0.0–0.5)
Eosinophils Relative: 4 %
HCT: 35.1 % — ABNORMAL LOW (ref 36.0–46.0)
Hemoglobin: 10.7 g/dL — ABNORMAL LOW (ref 12.0–15.0)
Immature Granulocytes: 0 %
Lymphocytes Relative: 51 %
Lymphs Abs: 1.9 10*3/uL (ref 0.7–4.0)
MCH: 26.1 pg (ref 26.0–34.0)
MCHC: 30.5 g/dL (ref 30.0–36.0)
MCV: 85.6 fL (ref 80.0–100.0)
Monocytes Absolute: 0.2 10*3/uL (ref 0.1–1.0)
Monocytes Relative: 6 %
Neutro Abs: 1.4 10*3/uL — ABNORMAL LOW (ref 1.7–7.7)
Neutrophils Relative %: 38 %
Platelets: 160 10*3/uL (ref 150–400)
RBC: 4.1 MIL/uL (ref 3.87–5.11)
RDW: 15.5 % (ref 11.5–15.5)
WBC: 3.7 10*3/uL — ABNORMAL LOW (ref 4.0–10.5)
nRBC: 0 % (ref 0.0–0.2)

## 2022-12-29 MED ORDER — MECLIZINE HCL 12.5 MG PO TABS
25.0000 mg | ORAL_TABLET | Freq: Once | ORAL | Status: AC
  Administered 2022-12-29: 25 mg via ORAL
  Filled 2022-12-29: qty 2

## 2022-12-29 MED ORDER — ONDANSETRON HCL 4 MG/2ML IJ SOLN
4.0000 mg | Freq: Once | INTRAMUSCULAR | Status: AC
  Administered 2022-12-29: 4 mg via INTRAVENOUS
  Filled 2022-12-29: qty 2

## 2022-12-29 MED ORDER — DIAZEPAM 5 MG/ML IJ SOLN
5.0000 mg | Freq: Once | INTRAMUSCULAR | Status: AC
  Administered 2022-12-29: 5 mg via INTRAVENOUS
  Filled 2022-12-29: qty 2

## 2022-12-29 MED ORDER — MECLIZINE HCL 25 MG PO TABS: 25.0000 mg | ORAL_TABLET | Freq: Three times a day (TID) | ORAL | 0 refills | Status: DC | PRN

## 2022-12-29 MED ORDER — SODIUM CHLORIDE 0.9 % IV BOLUS
500.0000 mL | Freq: Once | INTRAVENOUS | Status: AC
  Administered 2022-12-29: 500 mL via INTRAVENOUS

## 2022-12-29 NOTE — ED Notes (Signed)
Pt states, " I am feeling feeling better."   Pt VS WNL. Denies feeling dizzy, nausea, and vomiting. No distress present.

## 2022-12-29 NOTE — Discharge Instructions (Signed)
Follow-up with your doctor if you have any more problems.

## 2022-12-29 NOTE — ED Notes (Signed)
Pt cleaned. Had mooshy dark brown bm. Nad. Pt able to roll side to side. MRI aware pt is ready

## 2022-12-29 NOTE — ED Notes (Signed)
Discharge instructions reviewed and discussed with pt.   Newly prescribed medications reviewed. Pharmacy verified for accuracy.   New catheter bag placed per pt request.   Opportunity for questions and concerns provided.

## 2022-12-29 NOTE — ED Notes (Signed)
Attempted to clean pt after she had bowel movement, pt is still actively having a BM.

## 2022-12-29 NOTE — ED Notes (Signed)
Pt foley irrigated per pt request with aseptic technique. Foley was draining sediment was present.

## 2022-12-29 NOTE — ED Triage Notes (Addendum)
Per ems called for ems, woke with dizziness. Arrived a/o. Nad. Cbg 127

## 2022-12-29 NOTE — ED Notes (Signed)
Pt denied dizziness. Resting comfortably watching tv.

## 2022-12-29 NOTE — ED Notes (Signed)
Pt denies pain or headache or n/v/d. States feels like she is on a ship swaying. No lethargy noted. A/o. Color wnl. Suprapubic cath intact with no ss of infection to site

## 2022-12-30 NOTE — ED Provider Notes (Addendum)
[place] EMERGENCY DEPARTMENT AT [facility] Provider Note   [name]: [id number] Arrival date & time: 12/29/22  8119     History  Chief Complaint  Patient presents with   Dizziness    Melissa West is a 32 y.o. female.  Patient states that she woke up today feeling dizzy.  Everything seems to be spinning in the room.  Patient has a history of MS   The history is provided by the patient and medical records. No language interpreter was used.  Dizziness Quality:  Head spinning Severity:  Mild Onset quality:  Sudden Timing:  Constant Progression:  Waxing and waning Chronicity:  New Context: not when bending over   Relieved by:  Nothing Worsened by:  Nothing Ineffective treatments:  None tried Associated symptoms: no chest pain, no diarrhea and no headaches        Home Medications Prior to Admission medications   Medication Sig Start Date End Date Taking? Authorizing Provider  meclizine (ANTIVERT) 25 MG tablet Take 1 tablet (25 mg total) by mouth 3 (three) times daily as needed for dizziness. 12/29/22  Yes Bethann Berkshire, MD  acetaminophen (TYLENOL) 325 MG tablet Take 2 tablets (650 mg total) by mouth every 6 (six) hours as needed for mild pain (or Fever >/= 101). 05/20/21   Shon Hale, MD  acetic acid 0.25 % irrigation Irrigate with as directed daily. Irrigate 60 daily 02/03/22   [name], Mardene Celeste, MD  AMBULATORY NON FORMULARY MEDICATION 30 mLs by Intracatheter route daily as needed. Medication Name: Sodium Chloride 0.9% 11/12/21   [name], Mardene Celeste, MD  AMBULATORY NON FORMULARY MEDICATION Medication Name: Bedside catheter bag 08/12/22   Bjorn Pippin, MD  baclofen (LIORESAL) 10 MG tablet Take 1 tablet (10 mg total) by mouth 3 (three) times daily. 10/18/22   Sater, Pearletha Furl, MD  KESIMPTA 20 MG/0.4ML SOAJ Inject 20 mg into the skin every 30 (thirty) days. 06/24/21   [provider]  Syringe, Disposable, (50-60CC SYRINGE) 60 ML MISC Flush 30-50cc of  sterile water into catheter daily as needed 01/15/22   [name], Mardene Celeste, MD  Water For Irrigation, Sterile (STERILE WATER FOR IRRIGATION) Irrigate with 50 mLs as directed daily. Irrigate catheter daily with 50cc of sterile water. 01/15/22   [name], Mardene Celeste, MD      Allergies    Patient has no known allergies.    Review of Systems   Review of Systems  Constitutional:  Negative for appetite change and fatigue.  HENT:  Negative for congestion, ear discharge and sinus pressure.   Eyes:  Negative for discharge.  Respiratory:  Negative for cough.   Cardiovascular:  Negative for chest pain.  Gastrointestinal:  Negative for abdominal pain and diarrhea.  Genitourinary:  Negative for frequency and hematuria.  Musculoskeletal:  Negative for back pain.  Skin:  Negative for rash.  Neurological:  Positive for dizziness. Negative for seizures and headaches.  Psychiatric/Behavioral:  Negative for hallucinations.     Physical Exam Updated Vital Signs BP 101/71   Pulse 69   Temp 97.7 F (36.5 C)   Resp 17   LMP 12/13/2022 (Approximate)   SpO2 98%  Physical Exam Vitals and nursing note reviewed.  Constitutional:      Appearance: She is well-developed.  HENT:     Head: Normocephalic.     Comments: Patient had horizontal nystagmus    Nose: Nose normal.  Eyes:     General: No scleral icterus.    Conjunctiva/sclera: Conjunctivae normal.  Neck:     Thyroid: No thyromegaly.  Cardiovascular:     Rate and Rhythm: Normal rate and regular rhythm.     Heart sounds: No murmur heard.    No friction rub. No gallop.  Pulmonary:     Breath sounds: No stridor. No wheezing or rales.  Chest:     Chest wall: No tenderness.  Abdominal:     General: There is no distension.     Tenderness: There is no abdominal tenderness. There is no rebound.  Musculoskeletal:     Cervical back: Neck supple.     Comments: Patient has bed ridden and have chronic weakness in her legs from the MS  Lymphadenopathy:      Cervical: No cervical adenopathy.  Skin:    Findings: No erythema or rash.  Neurological:     Mental Status: She is alert and oriented to person, place, and time.     Motor: No abnormal muscle tone.     Coordination: Coordination normal.  Psychiatric:        Behavior: Behavior normal.     ED Results / Procedures / Treatments   Labs (all labs ordered are listed, but only abnormal results are displayed) Labs Reviewed  CBC WITH DIFFERENTIAL/PLATELET - Abnormal; Notable for the following components:      Result Value   WBC 3.7 (*)    Hemoglobin 10.7 (*)    HCT 35.1 (*)    Neutro Abs 1.4 (*)    All other components within normal limits  COMPREHENSIVE METABOLIC PANEL - Abnormal; Notable for the following components:   Calcium 8.5 (*)    Albumin 3.1 (*)    All other components within normal limits    EKG None  Radiology MR BRAIN WO CONTRAST  Result Date: 12/29/2022 CLINICAL DATA:  Neuro deficit, acute, stroke suspected EXAM: MRI HEAD WITHOUT CONTRAST TECHNIQUE: Multiplanar, multiecho pulse sequences of the brain and surrounding structures were obtained without intravenous contrast. COMPARISON:  Brain MR 10/08/22 FINDINGS: Brain: Negative for an acute infarct. No hemorrhage. No hydrocephalus. No extra-axial fluid collection. Redemonstrated extensive periventricular T2/FLAIR hyperintense signal abnormality favored to represent chronic demyelinating lesions. Unchanged periaqueductal T2 hyperintense signal abnormality. Prominent appearance of the pituitary gland, unchanged from prior exam. Vascular: Normal flow voids. Skull and upper cervical spine: Normal marrow signal. Sinuses/Orbits: No middle ear or mastoid effusion. Paranasal sinuses are clear. Orbits are unremarkable. Other: None. IMPRESSION: 1. No acute intracranial process. 2. Redemonstrated extensive periventricular T2/FLAIR hyperintense signal abnormality favored to represent chronic demyelinating lesions. Electronically Signed    By: Lorenza Cambridge M.D.   On: 12/29/2022 13:03    Procedures Procedures    Medications Ordered in ED Medications  sodium chloride 0.9 % bolus 500 mL (0 mLs Intravenous Stopped 12/29/22 1150)  ondansetron (ZOFRAN) injection 4 mg (4 mg Intravenous Given 12/29/22 0956)  meclizine (ANTIVERT) tablet 25 mg (25 mg Oral Given 12/29/22 0957)  diazepam (VALIUM) injection 5 mg (5 mg Intravenous Given 12/29/22 6213)    ED Course/ Medical Decision Making/ A&P                                 Medical Decision Making Amount and/or Complexity of Data Reviewed Labs: ordered. Radiology: ordered.  Risk Prescription drug management.  This patient presents to the ED for concern of vertigo, this involves an extensive number of treatment options, and is a complaint that carries with it a high risk  of complications and morbidity.  The differential diagnosis includes stroke versus inner ear disease   Co morbidities that complicate the patient evaluation  MS   Additional history obtained:  Additional history obtained from patient External records from outside source obtained and reviewed including hospital records   Lab Tests:  I Ordered, and personally interpreted labs.  The pertinent results include: Hemoglobin 10.7   Imaging Studies ordered:  I ordered imaging studies including MRI of the brain I independently visualized and interpreted imaging which showed no acute disease, chronic demyelination lesion I agree with the radiologist interpretation   Cardiac Monitoring: / EKG:  The patient was maintained on a cardiac monitor.  I personally viewed and interpreted the cardiac monitored which showed an underlying rhythm of: Normal sinus rhythm   Consultations Obtained:  No consultant  Problem List / ED Course / Critical interventions / Medication management  Dizziness and MS I ordered medication including Antivert and Valium Reevaluation of the patient after these medicines showed  that the patient improved I have reviewed the patients home medicines and have made adjustments as needed   Social Determinants of Health:  None   Test / Admission - Considered:  None    Patient with vertigo that has improved with Antivert and Valium.  She will be discharged home with Antivert        Final Clinical Impression(s) / ED Diagnoses Final diagnoses:  Vertigo    Rx / DC Orders ED Discharge Orders          Ordered    meclizine (ANTIVERT) 25 MG tablet  3 times daily PRN        12/29/22 1615              Bethann Berkshire, MD 12/30/22 1053    Bethann Berkshire, MD 12/30/22 1055

## 2022-12-31 ENCOUNTER — Encounter: Payer: Self-pay | Admitting: Family Medicine

## 2022-12-31 ENCOUNTER — Ambulatory Visit (INDEPENDENT_AMBULATORY_CARE_PROVIDER_SITE_OTHER): Payer: 59 | Admitting: Family Medicine

## 2022-12-31 VITALS — BP 106/71 | HR 102 | Ht 62.0 in

## 2022-12-31 DIAGNOSIS — R7301 Impaired fasting glucose: Secondary | ICD-10-CM | POA: Diagnosis not present

## 2022-12-31 DIAGNOSIS — E559 Vitamin D deficiency, unspecified: Secondary | ICD-10-CM

## 2022-12-31 DIAGNOSIS — Z1159 Encounter for screening for other viral diseases: Secondary | ICD-10-CM

## 2022-12-31 DIAGNOSIS — E038 Other specified hypothyroidism: Secondary | ICD-10-CM

## 2022-12-31 DIAGNOSIS — E7849 Other hyperlipidemia: Secondary | ICD-10-CM | POA: Diagnosis not present

## 2022-12-31 DIAGNOSIS — R29898 Other symptoms and signs involving the musculoskeletal system: Secondary | ICD-10-CM

## 2022-12-31 DIAGNOSIS — A64 Unspecified sexually transmitted disease: Secondary | ICD-10-CM | POA: Diagnosis not present

## 2022-12-31 DIAGNOSIS — Z114 Encounter for screening for human immunodeficiency virus [HIV]: Secondary | ICD-10-CM

## 2022-12-31 NOTE — Progress Notes (Signed)
New Patient Office Visit  Subjective:  Patient ID: Melissa West, female    DOB: 09/08/1990  Age: 32 y.o. MRN: [id number]  CC:  Chief Complaint  Patient presents with   New Patient (Initial Visit)    Establishing care.    HPI Melissa West is a 32 y.o. female with past medical history of MS presents for establishing care.  Multiple Sclerosis:The patient was diagnosed with multiple sclerosis at the age of 72 and is currently under the care of neurology, with a follow-up appointment scheduled for January 06, 2023. She is receiving monthly injections of Kesimpta. The patient reports bilateral lower extremity weakness and is wheelchair dependent. A referral for physical therapy was placed by her neurologist in [place]; however, the patient expressed a preference for receiving physical therapy in [place].  Urinary Retention:The patient is under the care of urology and has a catheter in place, which is secure and shows no signs of infection in the surrounding tissues.  STD Testing:The patient reports being sexually active and has a son. She would like to be tested for STDs today and indicates that she is not currently experiencing any vaginal symptoms.  Past Medical History:  Diagnosis Date   MS (multiple sclerosis) (HCC)    No pertinent past medical history     Past Surgical History:  Procedure Laterality Date   CESAREAN SECTION  08/09/2011   Procedure: CESAREAN SECTION;  Surgeon: Lesly Dukes, MD;  Location: [facility];  Service: Gynecology;  Laterality: N/A;   IR CATHETER TUBE CHANGE  07/22/2021   IR CATHETER TUBE CHANGE  09/10/2021    Family History  Problem Relation Age of Onset   Diabetes Maternal Grandmother    Hypertension Maternal Grandmother    Kidney disease Maternal Grandmother     Social History   Socioeconomic History   Marital status: Single    Spouse name: Not on file   Number of children: Not on file   Years of education: Not on file    Highest education level: Not on file  Occupational History   Not on file  Tobacco Use   Smoking status: Never   Smokeless tobacco: Never  Vaping Use   Vaping status: Never Used  Substance and Sexual Activity   Alcohol use: No   Drug use: No   Sexual activity: Yes    Birth control/protection: None  Other Topics Concern   Not on file  Social History Narrative   Left handed    Lives with kid   Caffeine use: none   Social Determinants of Health   Financial Resource Strain: Not on file  Food Insecurity: No Food Insecurity (10/09/2022)   Hunger Vital Sign    Worried About Running Out of Food in the Last Year: Never true    Ran Out of Food in the Last Year: Never true  Transportation Needs: No Transportation Needs (10/09/2022)   PRAPARE - Administrator, Civil Service (Medical): No    Lack of Transportation (Non-Medical): No  Physical Activity: Not on file  Stress: Not on file  Social Connections: Not on file  Intimate Partner Violence: Not At Risk (10/09/2022)   Humiliation, Afraid, Rape, and Kick questionnaire    Fear of Current or Ex-Partner: No    Emotionally Abused: No    Physically Abused: No    Sexually Abused: No    ROS Review of Systems  Constitutional:  Negative for chills and fever.  Respiratory:  Negative for chest tightness and  shortness of breath.   Cardiovascular:  Negative for chest pain.  Musculoskeletal:        Wheelchair dependent  Neurological:  Negative for dizziness and headaches.    Objective:   Today's Vitals: BP 106/71   Pulse (!) 102   Ht 5\' 2"  (1.575 m)   LMP 12/13/2022 (Approximate)   SpO2 98%   BMI 18.10 kg/m   Physical Exam HENT:     Head: Normocephalic.     Mouth/Throat:     Mouth: Mucous membranes are moist.  Cardiovascular:     Rate and Rhythm: Normal rate.     Heart sounds: Normal heart sounds.  Pulmonary:     Effort: Pulmonary effort is normal.     Breath sounds: Normal breath sounds.  Genitourinary:     Comments: Bladder catheter in place Musculoskeletal:     Comments: RLE and LLE 0/5 strength  RUE and LUE 4/5 strength  Neurological:     Mental Status: She is alert.      Assessment & Plan:   Weakness of both lower extremities Assessment & Plan: Referral placed to physical therapy  Orders: -     Ambulatory referral to Physical Therapy  STD (female) Assessment & Plan: Pending labs  Orders: -     T.pallidum Ab, Total -     HSV 1 and 2 Ab, IgG -     Chlamydia/GC NAA, Confirmation  IFG (impaired fasting glucose) -     Hemoglobin A1c  Vitamin D deficiency -     VITAMIN D 25 Hydroxy (Vit-D Deficiency, Fractures)  Need for hepatitis C screening test -     Hepatitis C antibody  Encounter for screening for HIV -     HIV Antibody (routine testing w rflx)  Other specified hypothyroidism -     TSH + free T4  Other hyperlipidemia -     Lipid panel -     CMP14+EGFR -     CBC with Differential/Platelet   Note: This chart has been completed using Engineer, civil (consulting) software, and while attempts have been made to ensure accuracy, certain words and phrases may not be transcribed as intended.    Follow-up: Return in about 4 months (around 05/02/2023).   Gilmore Laroche, FNP

## 2022-12-31 NOTE — Assessment & Plan Note (Signed)
Pending labs

## 2022-12-31 NOTE — Assessment & Plan Note (Signed)
Referral placed to physical therapy.

## 2022-12-31 NOTE — Patient Instructions (Addendum)
   I appreciate the opportunity to provide care to you today!    Follow up:  4 months  Labs: please stop by the lab during the week to get your blood drawn (CBC, CMP, TSH, Lipid profile, HgA1c, Vit D)  Screening: HIV and Hep C  To help with weight gain Encourage high-calorie and high-protein diet Recommended eating  5 to 6 small meals a day, instead of 3 large meals Encouraged to keep snacks around that are easy to eat. Try to eat often, even if you don't feel hungry Encouraged to drink high-protein nutritional supplement drinks (sample brand names: Ensure, Boost, Carnation Instant Breakfast   What foods give me extra calories or protein?  Eat these foods or mix them with other foods to increase calories and protein.  ?Grains - Add butter, cream cheese, or nut butter to bread, crackers, or pancakes. Mix pasta or quinoa with meats or vegetables. Sprinkle granola on yogurts and hot cereals. Choose croissants, muffins, and biscuits. ?Fruits - Mix fresh, frozen, or dried fruit into cereal or smoothies. Drink 100 percent fruit juice. Eat fresh, frozen, or canned fruit packed in its own juice. ?Vegetables - Add cheese, butter, or sauces to vegetables. Put avocado in sandwiches. Eat bean dip, guacamole, or hummus with chips. ?Dairy - Use full-fat milk or milk products. Add cheese to sandwiches and casseroles. Add [place] yogurt, heavy cream, or whipping cream to smoothies and shakes. Put sour cream or butter in other foods. Eat ice cream, custard, pudding, and cottage cheese. ?Meats, poultry, seafood, and proteins - Add meats or eggs to salads, casseroles, and vegetables. Use gravies and sauces. Snack on nuts and nut butter. ?Other foods or drinks - Drink high-protein nutritional supplement drinks (sample brand names: Ensure, Boost, [employer]).  Some tips to help you increase calories and protein in your diet:  ?Eat 5 to 6 small meals a day, instead of 3 large meals. You can  also choose high-calorie snacks and drinks between meals. ?Keep snacks around that are easy to eat. Try to eat often, even if you don't feel hungry. ?Add butter, olive oil, pesto, nuts, sauces, gravy, powdered milk, protein powder, or cream to your foods. This gives them extra calories and protein. ?Drink 100 percent fruit juice, milkshakes, and smoothies instead of water. Try to drink at the end of meals so you don't fill up too soon. ?Add syrup, jams and jellies, honey, or brown sugar to other foods. Snack on protein bars.  Please schedule medicare annual wellness visit.   Referrals today-  PT at [facility]  Please continue to a heart-healthy diet and increase your physical activities. Try to exercise for at least five days a week.    It was a pleasure to see you and I look forward to continuing to work together on your health and well-being. Please do not hesitate to call the office if you need care or have questions about your care.  In case of emergency, please visit the Emergency Department for urgent care, or contact our clinic at [phone] to schedule an appointment. We're here to help you!   Have a wonderful day and week. With Gratitude, Gilmore Laroche MSN, FNP-BC

## 2023-01-05 LAB — CHLAMYDIA/GC NAA, CONFIRMATION
Chlamydia trachomatis, NAA: NEGATIVE
Neisseria gonorrhoeae, NAA: NEGATIVE

## 2023-01-05 NOTE — Progress Notes (Signed)
Please inform the patient that she tested negative for gonorrhea chlamydia, her other labs are pending

## 2023-01-06 ENCOUNTER — Ambulatory Visit: Payer: 59

## 2023-01-06 DIAGNOSIS — R339 Retention of urine, unspecified: Secondary | ICD-10-CM

## 2023-01-06 NOTE — Progress Notes (Signed)
Suprapubic Cath Change  Patient is present today for a suprapubic catheter change due to urinary retention.  10ml of water was drained from the balloon, a 20FR foley cath was removed from the tract with out difficulty.  Site was cleaned and prepped in a sterile fashion with betadine.  A 20FR foley cath was replaced into the tract no complications were noted. Urine return was noted, 10 ml of sterile water was inflated into the balloon and a leg bag was attached for drainage.  Patient tolerated well. A night bag was given to patient and proper instruction was given on how to switch bags.    Performed by: Kennyth Lose, CMA  Follow up: No follow-ups on file.

## 2023-01-12 DIAGNOSIS — R7301 Impaired fasting glucose: Secondary | ICD-10-CM | POA: Diagnosis not present

## 2023-01-12 DIAGNOSIS — E559 Vitamin D deficiency, unspecified: Secondary | ICD-10-CM | POA: Diagnosis not present

## 2023-01-12 DIAGNOSIS — E038 Other specified hypothyroidism: Secondary | ICD-10-CM | POA: Diagnosis not present

## 2023-01-12 DIAGNOSIS — Z1159 Encounter for screening for other viral diseases: Secondary | ICD-10-CM | POA: Diagnosis not present

## 2023-01-13 LAB — CMP14+EGFR
ALT: 17 [IU]/L (ref 0–32)
AST: 20 [IU]/L (ref 0–40)
Albumin: 4.1 g/dL (ref 3.9–4.9)
Alkaline Phosphatase: 65 [IU]/L (ref 44–121)
BUN/Creatinine Ratio: 20 (ref 9–23)
BUN: 14 mg/dL (ref 6–20)
Bilirubin Total: 0.3 mg/dL (ref 0.0–1.2)
CO2: 19 mmol/L — ABNORMAL LOW (ref 20–29)
Calcium: 8.9 mg/dL (ref 8.7–10.2)
Chloride: 105 mmol/L (ref 96–106)
Creatinine, Ser: 0.69 mg/dL (ref 0.57–1.00)
Globulin, Total: 3.5 g/dL (ref 1.5–4.5)
Glucose: 80 mg/dL (ref 70–99)
Potassium: 4.4 mmol/L (ref 3.5–5.2)
Sodium: 140 mmol/L (ref 134–144)
Total Protein: 7.6 g/dL (ref 6.0–8.5)
eGFR: 117 mL/min/{1.73_m2} (ref >59)

## 2023-01-13 LAB — LIPID PANEL
Chol/HDL Ratio: 2.3 {ratio} (ref 0.0–4.4)
Cholesterol, Total: 134 mg/dL (ref 100–199)
HDL: 58 mg/dL (ref >39)
LDL Chol Calc (NIH): 68 mg/dL (ref 0–99)
Triglycerides: 30 mg/dL (ref 0–149)
VLDL Cholesterol Cal: 8 mg/dL (ref 5–40)

## 2023-01-13 LAB — HSV 1 AND 2 AB, IGG
HSV 1 Glycoprotein G Ab, IgG: 25 {index} — ABNORMAL HIGH (ref 0.00–0.90)
HSV 2 IgG, Type Spec: <0.91 {index} (ref 0.00–0.90)

## 2023-01-13 LAB — CBC WITH DIFFERENTIAL/PLATELET
Basophils Absolute: 0 10*3/uL (ref 0.0–0.2)
Basos: 1 %
EOS (ABSOLUTE): 0.1 10*3/uL (ref 0.0–0.4)
Eos: 3 %
Hematocrit: 35.3 % (ref 34.0–46.6)
Hemoglobin: 10.8 g/dL — ABNORMAL LOW (ref 11.1–15.9)
Immature Grans (Abs): 0 10*3/uL (ref 0.0–0.1)
Immature Granulocytes: 0 %
Lymphocytes Absolute: 2 10*3/uL (ref 0.7–3.1)
Lymphs: 48 %
MCH: 26.4 pg — ABNORMAL LOW (ref 26.6–33.0)
MCHC: 30.6 g/dL — ABNORMAL LOW (ref 31.5–35.7)
MCV: 86 fL (ref 79–97)
Monocytes Absolute: 0.2 10*3/uL (ref 0.1–0.9)
Monocytes: 6 %
Neutrophils Absolute: 1.7 10*3/uL (ref 1.4–7.0)
Neutrophils: 42 %
Platelets: 280 10*3/uL (ref 150–450)
RBC: 4.09 x10E6/uL (ref 3.77–5.28)
RDW: 14.8 % (ref 11.7–15.4)
WBC: 4.1 10*3/uL (ref 3.4–10.8)

## 2023-01-13 LAB — TSH+FREE T4
Free T4: 1.44 ng/dL (ref 0.82–1.77)
TSH: 2.43 u[IU]/mL (ref 0.450–4.500)

## 2023-01-13 LAB — VITAMIN D 25 HYDROXY (VIT D DEFICIENCY, FRACTURES): Vit D, 25-Hydroxy: 32.4 ng/mL (ref 30.0–100.0)

## 2023-01-13 LAB — HEPATITIS C ANTIBODY: Hep C Virus Ab: NONREACTIVE

## 2023-01-13 LAB — HEMOGLOBIN A1C
Est. average glucose Bld gHb Est-mCnc: 97 mg/dL
Hgb A1c MFr Bld: 5 % (ref 4.8–5.6)

## 2023-01-13 LAB — HIV ANTIBODY (ROUTINE TESTING W REFLEX): HIV Screen 4th Generation wRfx: NONREACTIVE

## 2023-01-14 LAB — T.PALLIDUM AB, TOTAL: Treponema pallidum Antibodies: NONREACTIVE

## 2023-01-17 ENCOUNTER — Emergency Department (HOSPITAL_COMMUNITY)
Admission: EM | Admit: 2023-01-17 | Discharge: 2023-01-18 | Disposition: A | Discharge status: Home / Self Care | Payer: 59 | Attending: Emergency Medicine | Admitting: Emergency Medicine

## 2023-01-17 ENCOUNTER — Encounter (HOSPITAL_COMMUNITY): Payer: Self-pay | Admitting: *Deleted

## 2023-01-17 ENCOUNTER — Other Ambulatory Visit: Payer: Self-pay

## 2023-01-17 DIAGNOSIS — Y838 Other surgical procedures as the cause of abnormal reaction of the patient, or of later complication, without mention of misadventure at the time of the procedure: Secondary | ICD-10-CM | POA: Insufficient documentation

## 2023-01-17 DIAGNOSIS — N3001 Acute cystitis with hematuria: Secondary | ICD-10-CM | POA: Diagnosis not present

## 2023-01-17 DIAGNOSIS — T83091A Other mechanical complication of indwelling urethral catheter, initial encounter: Secondary | ICD-10-CM | POA: Diagnosis not present

## 2023-01-17 DIAGNOSIS — R5381 Other malaise: Secondary | ICD-10-CM | POA: Diagnosis not present

## 2023-01-17 DIAGNOSIS — T83510A Infection and inflammatory reaction due to cystostomy catheter, initial encounter: Secondary | ICD-10-CM | POA: Insufficient documentation

## 2023-01-17 DIAGNOSIS — Z743 Need for continuous supervision: Secondary | ICD-10-CM | POA: Diagnosis not present

## 2023-01-17 DIAGNOSIS — I499 Cardiac arrhythmia, unspecified: Secondary | ICD-10-CM | POA: Diagnosis not present

## 2023-01-17 DIAGNOSIS — T839XXA Unspecified complication of genitourinary prosthetic device, implant and graft, initial encounter: Secondary | ICD-10-CM

## 2023-01-17 DIAGNOSIS — T83198A Other mechanical complication of other urinary devices and implants, initial encounter: Secondary | ICD-10-CM | POA: Diagnosis not present

## 2023-01-17 DIAGNOSIS — R350 Frequency of micturition: Secondary | ICD-10-CM | POA: Diagnosis present

## 2023-01-17 LAB — URINALYSIS, ROUTINE W REFLEX MICROSCOPIC
Bilirubin Urine: NEGATIVE
Glucose, UA: NEGATIVE mg/dL
Ketones, ur: NEGATIVE mg/dL
Nitrite: POSITIVE — AB
Protein, ur: 100 mg/dL — AB
RBC / HPF: >50 RBC/hpf (ref 0–5)
Specific Gravity, Urine: 1.013 (ref 1.005–1.030)
WBC, UA: >50 WBC/hpf (ref 0–5)
pH: 9 — ABNORMAL HIGH (ref 5.0–8.0)

## 2023-01-17 MED ORDER — CEPHALEXIN 500 MG PO CAPS
500.0000 mg | ORAL_CAPSULE | Freq: Four times a day (QID) | ORAL | 0 refills | Status: AC
Start: 2023-01-17 — End: 2023-01-24

## 2023-01-17 MED ORDER — CEPHALEXIN 500 MG PO CAPS
500.0000 mg | ORAL_CAPSULE | Freq: Once | ORAL | Status: AC
  Administered 2023-01-17: 500 mg via ORAL
  Filled 2023-01-17: qty 1

## 2023-01-17 NOTE — ED Notes (Signed)
Pt brief cleaned brief changed, foley empty, call EMS for ride home.

## 2023-01-17 NOTE — ED Notes (Signed)
Attempted to irrigate suprapubic catheter, unsuccessful, EDP Idol informed.

## 2023-01-17 NOTE — Discharge Instructions (Signed)
Try to sneak into more dose of the antibiotic prescribed today and complete the entire course as prescribed.  Make sure you are drinking plenty of fluids to help flush out this infection.  It is also important that you flush your Foley catheter daily to help prevent the tubes from getting blocked.  Plan to see your primary doctor or your urologist for any new or worsening symptoms.

## 2023-01-17 NOTE — ED Provider Notes (Signed)
[place] EMERGENCY DEPARTMENT AT [facility] Provider Note   [name]: [id number] Arrival date & time: 01/17/23  1046     History {Add pertinent medical, surgical, social history, OB history to HPI:1} Chief Complaint  Patient presents with  . urinary catheter clogged    Melissa West is a 32 y.o. female   The history is provided by the patient.       Home Medications Prior to Admission medications   Medication Sig Start Date End Date Taking? Authorizing Provider  cephALEXin (KEFLEX) 500 MG capsule Take 1 capsule (500 mg total) by mouth 4 (four) times daily for 7 days. 01/17/23 01/24/23 Yes Shaquaya Wuellner, Raynelle Fanning, PA-C  acetaminophen (TYLENOL) 325 MG tablet Take 2 tablets (650 mg total) by mouth every 6 (six) hours as needed for mild pain (or Fever >/= 101). 05/20/21   Shon Hale, MD  acetic acid 0.25 % irrigation Irrigate with as directed daily. Irrigate 60 daily 02/03/22   [name], Mardene Celeste, MD  AMBULATORY NON FORMULARY MEDICATION 30 mLs by Intracatheter route daily as needed. Medication Name: Sodium Chloride 0.9% 11/12/21   [name], Mardene Celeste, MD  AMBULATORY NON FORMULARY MEDICATION Medication Name: Bedside catheter bag 08/12/22   Bjorn Pippin, MD  baclofen (LIORESAL) 10 MG tablet Take 1 tablet (10 mg total) by mouth 3 (three) times daily. 10/18/22   Sater, Pearletha Furl, MD  KESIMPTA 20 MG/0.4ML SOAJ Inject 20 mg into the skin every 30 (thirty) days. 06/24/21   [provider]  meclizine (ANTIVERT) 25 MG tablet Take 1 tablet (25 mg total) by mouth 3 (three) times daily as needed for dizziness. 12/29/22   Bethann Berkshire, MD  Syringe, Disposable, (50-60CC SYRINGE) 60 ML MISC Flush 30-50cc of sterile water into catheter daily as needed 01/15/22   [name], Mardene Celeste, MD  Water For Irrigation, Sterile (STERILE WATER FOR IRRIGATION) Irrigate with 50 mLs as directed daily. Irrigate catheter daily with 50cc of sterile water. 01/15/22   [name], Mardene Celeste, MD      Allergies     Patient has no known allergies.    Review of Systems   Review of Systems  Physical Exam Updated Vital Signs BP 100/71 (BP Location: Right Arm)   Pulse (!) 59   Temp 98.1 F (36.7 C) (Oral)   Resp 18   Wt 44.5 kg   LMP 01/17/2023 (Exact Date)   SpO2 99%   BMI 17.92 kg/m  Physical Exam  ED Results / Procedures / Treatments   Labs (all labs ordered are listed, but only abnormal results are displayed) Labs Reviewed  URINALYSIS, ROUTINE W REFLEX MICROSCOPIC - Abnormal; Notable for the following components:      Result Value   APPearance TURBID (*)    pH 9.0 (*)    Hgb urine dipstick SMALL (*)    Protein, ur 100 (*)    Nitrite POSITIVE (*)    Leukocytes,Ua LARGE (*)    Bacteria, UA FEW (*)    All other components within normal limits  URINE CULTURE    EKG None  Radiology No results found.  Procedures Procedures  {Document cardiac monitor, telemetry assessment procedure when appropriate:1}  Medications Ordered in ED Medications  cephALEXin (KEFLEX) capsule 500 mg (500 mg Oral Given 01/17/23 1450)    ED Course/ Medical Decision Making/ A&P   {   Click here for ABCD2, HEART and other calculatorsREFRESH Note before signing :1}  Medical Decision Making Amount and/or Complexity of Data Reviewed Labs: ordered.  Risk Prescription drug management.   ***  {Document critical care time when appropriate:1} {Document review of labs and clinical decision tools ie heart score, Chads2Vasc2 etc:1}  {Document your independent review of radiology images, and any outside records:1} {Document your discussion with family members, caretakers, and with consultants:1} {Document social determinants of health affecting pt's care:1} {Document your decision making why or why not admission, treatments were needed:1} Final Clinical Impression(s) / ED Diagnoses Final diagnoses:  Problem with Foley catheter, initial encounter (HCC)  Acute cystitis with  hematuria    Rx / DC Orders ED Discharge Orders          Ordered    cephALEXin (KEFLEX) 500 MG capsule  4 times daily        01/17/23 1448

## 2023-01-17 NOTE — ED Triage Notes (Signed)
BIB [facility] from home for clogged urinary catheter. First noticed not draining last night. H/o same frequently. Denies pain or other sx. Alert, NAD, calm, interactive.

## 2023-01-18 NOTE — ED Notes (Signed)
Pt given crackers and peanut butter per request. 

## 2023-01-19 NOTE — Progress Notes (Signed)
Please inform the patient that her HIV test result is negative. 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 Treatment is not necessary unless she experiences an outbreak. Additionally, please let her know that she tested negative for HSV-2, which is associated with genital herpes.

## 2023-01-20 LAB — URINE CULTURE: Culture: >=100000 — AB

## 2023-01-21 ENCOUNTER — Telehealth (HOSPITAL_BASED_OUTPATIENT_CLINIC_OR_DEPARTMENT_OTHER): Payer: Self-pay | Admitting: *Deleted

## 2023-01-21 NOTE — Telephone Encounter (Signed)
Post ED Visit - Positive Culture Follow-up  Culture report reviewed by antimicrobial stewardship pharmacist: [facility] [x]  Anda Kraft, Pharm.D. []  Celedonio Miyamoto, Pharm.D., BCPS AQ-ID []  Garvin Fila, Pharm.D., BCPS []  Georgina Pillion, [address].D., BCPS []  [place], [address].D., BCPS, AAHIVP []  Estella Husk, Pharm.D., BCPS, AAHIVP []  Lysle Pearl, PharmD, BCPS []  Phillips Climes, PharmD, BCPS []  Agapito Games, PharmD, BCPS []  Verlan Friends, PharmD []  Mervyn Gay, PharmD, BCPS []  Vinnie Level, PharmD  [facility] []  Len Childs, PharmD []  Greer Pickerel, PharmD []  Adalberto Cole, PharmD []  Perlie Gold, Rph []  Lonell Face) Jean Rosenthal, PharmD []  Earl Many, PharmD []  Junita Push, PharmD []  Dorna Leitz, PharmD []  Terrilee Files, PharmD []  Lynann Beaver, PharmD []  Keturah Barre, PharmD []  Loralee Pacas, PharmD []  Bernadene Person, PharmD   Positive urine culture Treated with Cephalexin, likely contamination, and no further patient follow-up is required at this time.  Virl Axe [facility] 01/21/2023, 8:42 AM

## 2023-01-21 NOTE — Progress Notes (Signed)
ED Antimicrobial Stewardship Positive Culture Follow Up   Melissa West is an 32 y.o. female who presented to [facility] on 01/17/2023 with a chief complaint of  Chief Complaint  Patient presents with   urinary catheter clogged    Recent Results (from the past 720 hour(s))  Chlamydia/GC NAA, Confirmation     Status: None   Collection Time: 12/31/22  2:30 PM   Specimen: Urine   UR  Result Value Ref Range Status   Chlamydia trachomatis, NAA Negative Negative Final   Neisseria gonorrhoeae, NAA Negative Negative Final  Urine Culture     Status: Abnormal   Collection Time: 01/17/23  2:36 PM   Specimen: Urine, Suprapubic  Result Value Ref Range Status   Specimen Description   Final    URINE, SUPRAPUBIC Performed at [facility], [address], [place], [place] [address]    Special Requests   Final    NONE Performed at [facility], [address] [place], [place] [address]    Culture (A)  Final    >=100,000 COLONIES/mL PROTEUS MIRABILIS >=100,000 COLONIES/mL FLAVOBACTERIUM ODORATUM    Report Status 01/20/2023 FINAL  Final   Organism ID, Bacteria PROTEUS MIRABILIS (A)  Final   Organism ID, Bacteria FLAVOBACTERIUM ODORATUM (A)  Final      Susceptibility   Flavobacterium odoratum - MIC*    CIPROFLOXACIN 0.5 SENSITIVE Sensitive     GENTAMICIN >=16 RESISTANT Resistant     IMIPENEM 1 SENSITIVE Sensitive     TRIMETH/SULFA 160 RESISTANT Resistant     PIP/TAZO <=4 SENSITIVE Sensitive ug/mL    * >=100,000 COLONIES/mL FLAVOBACTERIUM ODORATUM   Proteus mirabilis - MIC*    AMPICILLIN <=2 SENSITIVE Sensitive     CEFAZOLIN <=4 SENSITIVE Sensitive     CEFEPIME <=0.12 SENSITIVE Sensitive     CEFTRIAXONE <=0.25 SENSITIVE Sensitive     CIPROFLOXACIN <=0.25 SENSITIVE Sensitive     GENTAMICIN <=1 SENSITIVE Sensitive     IMIPENEM 2 SENSITIVE Sensitive     NITROFURANTOIN 128 RESISTANT Resistant     TRIMETH/SULFA <=20 SENSITIVE Sensitive     AMPICILLIN/SULBACTAM <=2 SENSITIVE Sensitive      PIP/TAZO <=4 SENSITIVE Sensitive ug/mL    * >=100,000 COLONIES/mL PROTEUS MIRABILIS   32 year old patient with a chronic catheter that presented with a clogged catheter. Cathter was replaced in the ED. UA was positive for nitrites, large leukocytes, > 50 WBC, and few bacteria. UA was likely abnormal secondary to the catheter. No signs of systemic infection present. Cultures likely represent colonization and no additional treatment is necessary   ED Provider: Maxwell Marion, PA   Griffin Dakin 01/21/2023, 8:17 AM Clinical Pharmacist Monday - Friday phone -  [phone] Saturday - Sunday phone - [phone]

## 2023-01-24 ENCOUNTER — Telehealth: Payer: Self-pay

## 2023-01-24 NOTE — Telephone Encounter (Signed)
Patient called into office today concerned with SP tube becoming clogged.  Discussed with patient about daily catheter flushes. Patient reports she is flushing her catheter daily but the tube is still clogged.  Message sent to provider.

## 2023-01-25 ENCOUNTER — Other Ambulatory Visit: Payer: Self-pay

## 2023-01-25 ENCOUNTER — Emergency Department (HOSPITAL_COMMUNITY)
Admission: EM | Admit: 2023-01-25 | Discharge: 2023-01-25 | Disposition: A | Discharge status: Home / Self Care | Payer: 59 | Attending: Emergency Medicine | Admitting: Emergency Medicine

## 2023-01-25 ENCOUNTER — Encounter (HOSPITAL_COMMUNITY): Payer: Self-pay | Admitting: Emergency Medicine

## 2023-01-25 DIAGNOSIS — T83091A Other mechanical complication of indwelling urethral catheter, initial encounter: Secondary | ICD-10-CM | POA: Diagnosis not present

## 2023-01-25 DIAGNOSIS — R1084 Generalized abdominal pain: Secondary | ICD-10-CM | POA: Insufficient documentation

## 2023-01-25 DIAGNOSIS — T849XXA Unspecified complication of internal orthopedic prosthetic device, implant and graft, initial encounter: Secondary | ICD-10-CM | POA: Diagnosis not present

## 2023-01-25 DIAGNOSIS — Z743 Need for continuous supervision: Secondary | ICD-10-CM | POA: Diagnosis not present

## 2023-01-25 DIAGNOSIS — R531 Weakness: Secondary | ICD-10-CM | POA: Diagnosis not present

## 2023-01-25 DIAGNOSIS — T839XXA Unspecified complication of genitourinary prosthetic device, implant and graft, initial encounter: Secondary | ICD-10-CM

## 2023-01-25 DIAGNOSIS — R5381 Other malaise: Secondary | ICD-10-CM | POA: Diagnosis not present

## 2023-01-25 NOTE — Discharge Instructions (Addendum)
Follow-up with [facility].

## 2023-01-25 NOTE — ED Triage Notes (Signed)
Pt BIB EMS for clogged urinary catheter, here for same last Tuesday, catheter was replaced.

## 2023-01-25 NOTE — ED Provider Notes (Signed)
[place] EMERGENCY DEPARTMENT AT [facility] Provider Note   [name]: [id number] Arrival date & time: 01/25/23  1048     History {Add pertinent medical, surgical, social history, OB history to HPI:1} Chief Complaint  Patient presents with   Foley Catheter Clogged    Melissa West is a 32 y.o. female.  Patient has a suprapubic that is not draining.  Patient has multiple sclerosis   Abdominal Pain      Home Medications Prior to Admission medications   Medication Sig Start Date End Date Taking? Authorizing Provider  acetaminophen (TYLENOL) 325 MG tablet Take 2 tablets (650 mg total) by mouth every 6 (six) hours as needed for mild pain (or Fever >/= 101). 05/20/21   Shon Hale, MD  acetic acid 0.25 % irrigation Irrigate with as directed daily. Irrigate 60 daily 02/03/22   [name], Mardene Celeste, MD  AMBULATORY NON FORMULARY MEDICATION 30 mLs by Intracatheter route daily as needed. Medication Name: Sodium Chloride 0.9% 11/12/21   [name], Mardene Celeste, MD  AMBULATORY NON FORMULARY MEDICATION Medication Name: Bedside catheter bag 08/12/22   Bjorn Pippin, MD  baclofen (LIORESAL) 10 MG tablet Take 1 tablet (10 mg total) by mouth 3 (three) times daily. 10/18/22   Sater, Pearletha Furl, MD  KESIMPTA 20 MG/0.4ML SOAJ Inject 20 mg into the skin every 30 (thirty) days. 06/24/21   [provider]  meclizine (ANTIVERT) 25 MG tablet Take 1 tablet (25 mg total) by mouth 3 (three) times daily as needed for dizziness. 12/29/22   Bethann Berkshire, MD  Syringe, Disposable, (50-60CC SYRINGE) 60 ML MISC Flush 30-50cc of sterile water into catheter daily as needed 01/15/22   [name], Mardene Celeste, MD  Water For Irrigation, Sterile (STERILE WATER FOR IRRIGATION) Irrigate with 50 mLs as directed daily. Irrigate catheter daily with 50cc of sterile water. 01/15/22   [name], Mardene Celeste, MD      Allergies    Patient has no known allergies.    Review of Systems   Review of Systems  Gastrointestinal:   Positive for abdominal pain.    Physical Exam Updated Vital Signs BP 100/70 (BP Location: Right Arm)   Pulse 73   Temp 98.1 F (36.7 C) (Oral)   Resp 16   Ht 5\' 2"  (1.575 m)   Wt 44.5 kg   LMP 01/17/2023 (Exact Date)   SpO2 100%   BMI 17.94 kg/m  Physical Exam  ED Results / Procedures / Treatments   Labs (all labs ordered are listed, but only abnormal results are displayed) Labs Reviewed - No data to display  EKG None  Radiology No results found.  Procedures Procedures  {Document cardiac monitor, telemetry assessment procedure when appropriate:1}  Medications Ordered in ED Medications - No data to display  ED Course/ Medical Decision Making/ A&P   {   Click here for ABCD2, HEART and other calculatorsREFRESH Note before signing :1}                              Medical Decision Making  Suprapubic catheter replaced  {Document critical care time when appropriate:1} {Document review of labs and clinical decision tools ie heart score, Chads2Vasc2 etc:1}  {Document your independent review of radiology images, and any outside records:1} {Document your discussion with family members, caretakers, and with consultants:1} {Document social determinants of health affecting pt's care:1} {Document your decision making why or why not admission, treatments were needed:1} Final Clinical Impression(s) /  ED Diagnoses Final diagnoses:  Problem with Foley catheter, initial encounter [facility])    Rx / DC Orders ED Discharge Orders     None

## 2023-01-26 ENCOUNTER — Telehealth: Payer: Self-pay | Admitting: Neurology

## 2023-01-26 MED ORDER — AMANTADINE HCL 100 MG PO CAPS: 100.0000 mg | ORAL_CAPSULE | Freq: Two times a day (BID) | ORAL | 11 refills | Status: DC

## 2023-01-26 NOTE — Telephone Encounter (Signed)
Patient informed with below , Rx sent to pharmacy.

## 2023-01-26 NOTE — Telephone Encounter (Signed)
Patient states she is flushing daily with acetic acid.  She currently has 48F sp cath do you recommend increasing to a 40f?  Please advise.

## 2023-01-26 NOTE — Telephone Encounter (Addendum)
Spoke with Dr. Epimenio Foot. He recommends placing pt on Amantadine 100mg  BID #60, 11 refills.  LVM for pt to call office

## 2023-01-26 NOTE — Telephone Encounter (Signed)
Pt is asking for a call to discuss what Dr Epimenio Foot can call in for her to assist with her difficulty in walking (pt was reminded of her upcoming appointment)

## 2023-01-26 NOTE — Telephone Encounter (Signed)
Dr. Epimenio Foot- what would you recommend?

## 2023-01-26 NOTE — Telephone Encounter (Signed)
Pt last seen 10/18/22 and next f/u 05/04/23. Dx: MS. DMT: Kesimpta. Per last note: "She is wheelchair-bound and cannot stand even with strong support. "  Plan from 10/18/22 visit:    Lab results:

## 2023-02-09 ENCOUNTER — Ambulatory Visit: Payer: 59

## 2023-02-14 ENCOUNTER — Ambulatory Visit (INDEPENDENT_AMBULATORY_CARE_PROVIDER_SITE_OTHER): Payer: 59

## 2023-02-14 DIAGNOSIS — R339 Retention of urine, unspecified: Secondary | ICD-10-CM

## 2023-02-14 NOTE — Progress Notes (Addendum)
Suprapubic Cath Change  Patient is present today for a suprapubic catheter change due to urinary retention.  10ml of water was drained from the balloon, a 20FR foley cath was removed from the tract with out difficulty.  Site was cleaned and prepped in a sterile fashion with betadine.  A 20FR foley cath was replaced into the tract no complications were noted. Urine return was noted, 10 ml of sterile water was inflated into the balloon and a leg bag was attached for drainage.  Patient tolerated well. A night bag was given to patient and proper instruction was given on how to switch bags.    Performed by: Guss Bunde, CMA  Follow up: as scheduled.

## 2023-02-18 ENCOUNTER — Other Ambulatory Visit: Payer: Self-pay | Admitting: Urology

## 2023-02-22 NOTE — Telephone Encounter (Signed)
Catheter changed on 11/04.  No complaints at that time, appointment notes updated ok to increase to 49f at next catheter change if needed per MD.

## 2023-02-24 ENCOUNTER — Encounter (HOSPITAL_COMMUNITY): Payer: Self-pay | Admitting: Emergency Medicine

## 2023-02-24 ENCOUNTER — Emergency Department (HOSPITAL_COMMUNITY)
Admission: EM | Admit: 2023-02-24 | Discharge: 2023-02-24 | Disposition: A | Discharge status: Home / Self Care | Payer: 59 | Attending: Emergency Medicine | Admitting: Emergency Medicine

## 2023-02-24 ENCOUNTER — Other Ambulatory Visit: Payer: Self-pay

## 2023-02-24 DIAGNOSIS — R5381 Other malaise: Secondary | ICD-10-CM | POA: Diagnosis not present

## 2023-02-24 DIAGNOSIS — Z466 Encounter for fitting and adjustment of urinary device: Secondary | ICD-10-CM | POA: Insufficient documentation

## 2023-02-24 DIAGNOSIS — T83198A Other mechanical complication of other urinary devices and implants, initial encounter: Secondary | ICD-10-CM | POA: Diagnosis not present

## 2023-02-24 DIAGNOSIS — R531 Weakness: Secondary | ICD-10-CM | POA: Diagnosis not present

## 2023-02-24 DIAGNOSIS — Z7401 Bed confinement status: Secondary | ICD-10-CM | POA: Diagnosis not present

## 2023-02-24 DIAGNOSIS — Z743 Need for continuous supervision: Secondary | ICD-10-CM | POA: Diagnosis not present

## 2023-02-24 DIAGNOSIS — R339 Retention of urine, unspecified: Secondary | ICD-10-CM | POA: Insufficient documentation

## 2023-02-24 DIAGNOSIS — R6889 Other general symptoms and signs: Secondary | ICD-10-CM | POA: Diagnosis not present

## 2023-02-24 LAB — URINALYSIS, W/ REFLEX TO CULTURE (INFECTION SUSPECTED)
Bilirubin Urine: NEGATIVE
Glucose, UA: NEGATIVE mg/dL
Ketones, ur: NEGATIVE mg/dL
Nitrite: NEGATIVE
Protein, ur: >=300 mg/dL — AB
Specific Gravity, Urine: 1.014 (ref 1.005–1.030)
WBC, UA: >50 WBC/hpf (ref 0–5)
pH: 7 (ref 5.0–8.0)

## 2023-02-24 MED ORDER — CEPHALEXIN 500 MG PO CAPS
500.0000 mg | ORAL_CAPSULE | Freq: Two times a day (BID) | ORAL | 0 refills | Status: AC
Start: 2023-02-24 — End: 2023-03-03

## 2023-02-24 MED ORDER — CEPHALEXIN 500 MG PO CAPS
500.0000 mg | ORAL_CAPSULE | Freq: Once | ORAL | Status: AC
  Administered 2023-02-24: 500 mg via ORAL
  Filled 2023-02-24: qty 1

## 2023-02-24 NOTE — Discharge Instructions (Addendum)
It was a pleasure caring for you today.  I have sent the antibiotics to your pharmacy.  Please follow-up with your urologist in the next 4-5 days.  Seek emergency care experiencing any new or worsening symptoms.

## 2023-02-24 NOTE — ED Triage Notes (Signed)
Pt states her foley cath is clogged. Just here x 1 month ago. A/o.

## 2023-02-24 NOTE — ED Notes (Signed)
In with PA to insert suprapubic cath. Attempted to flush current cath first with no success. Given new cath bag as well and secured to right leg. Urinary output noted with insertion of new suprapubic cath.

## 2023-02-24 NOTE — ED Provider Notes (Signed)
[place] EMERGENCY DEPARTMENT AT [facility] Provider Note   [name]: [id number] Arrival date & time: 02/24/23  4098     History  Chief Complaint  Patient presents with   Urinary Retention    Melissa West is a 32 y.o. female with PMHx MS, chronic urinary retention who presents to ED concerned for suprapubic that is not draining. Patient noticed that it was clogged when she woke up this morning with urine leaking around her foley site. States that she meets with urology once monthly.  Denies fever, chest pain, dyspnea, cough, nausea, vomiting, diarrhea, abdominal pain.  HPI     Home Medications Prior to Admission medications   Medication Sig Start Date End Date Taking? Authorizing Provider  acetaminophen (TYLENOL) 325 MG tablet Take 2 tablets (650 mg total) by mouth every 6 (six) hours as needed for mild pain (or Fever >/= 101). 05/20/21   Emokpae, Courage, MD  acetic acid 0.25 % irrigation IRRIGATE WITH AS DIRECTED DAILY. 02/22/23   [name], Mardene Celeste, MD  amantadine (SYMMETREL) 100 MG capsule Take 1 capsule (100 mg total) by mouth 2 (two) times daily. 01/26/23   Sater, Pearletha Furl, MD  AMBULATORY NON FORMULARY MEDICATION 30 mLs by Intracatheter route daily as needed. Medication Name: Sodium Chloride 0.9% 11/12/21   [name], Mardene Celeste, MD  AMBULATORY NON FORMULARY MEDICATION Medication Name: Bedside catheter bag 08/12/22   Bjorn Pippin, MD  baclofen (LIORESAL) 10 MG tablet Take 1 tablet (10 mg total) by mouth 3 (three) times daily. 10/18/22   Sater, Pearletha Furl, MD  KESIMPTA 20 MG/0.4ML SOAJ Inject 20 mg into the skin every 30 (thirty) days. 06/24/21   [provider]  meclizine (ANTIVERT) 25 MG tablet Take 1 tablet (25 mg total) by mouth 3 (three) times daily as needed for dizziness. 12/29/22   Bethann Berkshire, MD  Syringe, Disposable, (50-60CC SYRINGE) 60 ML MISC Flush 30-50cc of sterile water into catheter daily as needed 01/15/22   [name], Mardene Celeste, MD  Water For  Irrigation, Sterile (STERILE WATER FOR IRRIGATION) Irrigate with 50 mLs as directed daily. Irrigate catheter daily with 50cc of sterile water. 01/15/22   [name], Mardene Celeste, MD      Allergies    Patient has no known allergies.    Review of Systems   Review of Systems  Genitourinary:  Positive for difficulty urinating.    Physical Exam Updated Vital Signs There were no vitals taken for this visit. Physical Exam Vitals and nursing note reviewed.  Constitutional:      General: She is not in acute distress.    Appearance: She is not ill-appearing or toxic-appearing.  HENT:     Head: Normocephalic and atraumatic.  Eyes:     General: No scleral icterus.       Right eye: No discharge.        Left eye: No discharge.     Conjunctiva/sclera: Conjunctivae normal.  Cardiovascular:     Rate and Rhythm: Normal rate.  Pulmonary:     Effort: Pulmonary effort is normal.  Abdominal:     General: Abdomen is flat. Bowel sounds are normal. There is no distension.     Palpations: Abdomen is soft. There is no mass.     Tenderness: There is no abdominal tenderness.     Comments: Suprapubic catheter site without surrounding erythema, swelling, or purulence.  Skin:    General: Skin is warm and dry.  Neurological:     General: No focal deficit present.  Mental Status: She is alert. Mental status is at baseline.  Psychiatric:        Mood and Affect: Mood normal.        Behavior: Behavior normal.     ED Results / Procedures / Treatments   Labs (all labs ordered are listed, but only abnormal results are displayed) Labs Reviewed - No data to display  EKG None  Radiology No results found.  Procedures BLADDER CATHETERIZATION  Date/Time: 02/24/2023 11:21 AM  Performed by: Dorthy Cooler, PA-C Authorized by: Dorthy Cooler, PA-C   Consent:    Consent obtained:  Verbal   Consent given by:  Patient   Risks, benefits, and alternatives were discussed: yes     Risks  discussed:  Infection, false passage, pain and incomplete procedure   Alternatives discussed:  No treatment Universal protocol:    Procedure explained and questions answered to patient or proxy's satisfaction: yes     Patient identity confirmed:  Verbally with patient Pre-procedure details:    Procedure purpose:  Therapeutic Anesthesia:    Anesthesia method:  None Procedure details:    Catheter insertion:  Indwelling   Catheter type:  Foley   Catheter size:  20 Fr   Number of attempts:  1   Urine characteristics:  Cloudy Post-procedure details:    Procedure completion:  Tolerated well, no immediate complications Comments:     Area cleaned with betadine. Foley inserted without complications. Bladder scan before catheter attempt was without concern for severe bladder distention.     Medications Ordered in ED Medications - No data to display  ED Course/ Medical Decision Making/ A&P                                 Medical Decision Making Amount and/or Complexity of Data Reviewed Labs: ordered.  Risk Prescription drug management.   This patient presents to the ED for concern of urinary retention, this involves an extensive number of treatment options, and is a complaint that carries with it a high risk of complications and morbidity.  The differential diagnosis includes outflow obstruction, infection, medication adverse reaction, neurologic impairment   Co morbidities that complicate the patient evaluation  MS, chronic urinary retention   Additional history obtained:  Patient seen multiple times in ED with same complaint   Problem List / ED Course / Critical interventions / Medication management  Patient presents to ED concern for blocked suprapubic catheter.  Patient denying any other infectious symptoms today.  Physical exam reassuring.  Patient afebrile with stable vitals.  I was able to change out the suprapubic catheter without complications. Urine appeared cloudy  - UA was obtained and concerning for UTI.  Shared decision making with patient who requested starting antibiotics today.  Recommended patient follow-up with urologist in the next couple days pending urine culture results.  Patient verbalized understanding of plan. I have reviewed the patients home medicines and have made adjustments as needed Patient afebrile with stable vitals.  Provided with return precautions.  Discharged in good condition.    Social Determinants of Health:  none          Final Clinical Impression(s) / ED Diagnoses Final diagnoses:  None    Rx / DC Orders ED Discharge Orders     None         Margarita Rana 02/24/23 1126    Bethann Berkshire, MD 02/25/23 1806

## 2023-02-27 LAB — URINE CULTURE: Culture: >=100000 — AB

## 2023-02-28 ENCOUNTER — Telehealth (HOSPITAL_BASED_OUTPATIENT_CLINIC_OR_DEPARTMENT_OTHER): Payer: Self-pay | Admitting: *Deleted

## 2023-02-28 NOTE — Progress Notes (Signed)
ED Antimicrobial Stewardship Positive Culture Follow Up   Melissa West is an 32 y.o. female who presented to [facility] on 02/24/2023 with a chief complaint of  Chief Complaint  Patient presents with   Urinary Retention    Recent Results (from the past 720 hour(s))  Urine Culture     Status: Abnormal   Collection Time: 02/24/23 10:56 AM   Specimen: Urine, Clean Catch  Result Value Ref Range Status   Specimen Description   Final    URINE, CLEAN CATCH Performed at [facility], [address], [place], [place] [address]    Special Requests   Final    NONE Reflexed from [id number] Performed at [facility], [address], [place], [place] [address]    Culture (A)  Final    >=100,000 COLONIES/mL PROVIDENCIA RETTGERI 80,000 COLONIES/mL PROTEUS MIRABILIS    Report Status 02/27/2023 FINAL  Final   Organism ID, Bacteria PROVIDENCIA RETTGERI (A)  Final   Organism ID, Bacteria PROTEUS MIRABILIS (A)  Final      Susceptibility   Proteus mirabilis - MIC*    AMPICILLIN <=2 SENSITIVE Sensitive     CEFAZOLIN <=4 SENSITIVE Sensitive     CEFEPIME <=0.12 SENSITIVE Sensitive     CEFTRIAXONE <=0.25 SENSITIVE Sensitive     CIPROFLOXACIN <=0.25 SENSITIVE Sensitive     GENTAMICIN <=1 SENSITIVE Sensitive     IMIPENEM 2 SENSITIVE Sensitive     NITROFURANTOIN RESISTANT Resistant     TRIMETH/SULFA <=20 SENSITIVE Sensitive     AMPICILLIN/SULBACTAM <=2 SENSITIVE Sensitive     PIP/TAZO <=4 SENSITIVE Sensitive ug/mL    * 80,000 COLONIES/mL PROTEUS MIRABILIS   Providencia rettgeri - MIC*    AMPICILLIN >=32 RESISTANT Resistant     CEFEPIME 0.5 SENSITIVE Sensitive     CEFTRIAXONE 2 INTERMEDIATE Intermediate     CIPROFLOXACIN <=0.25 SENSITIVE Sensitive     GENTAMICIN <=1 SENSITIVE Sensitive     IMIPENEM <=0.25 SENSITIVE Sensitive     NITROFURANTOIN RESISTANT Resistant     TRIMETH/SULFA <=20 SENSITIVE Sensitive     AMPICILLIN/SULBACTAM >=32 RESISTANT Resistant     PIP/TAZO >=128 RESISTANT  Resistant ug/mL    * >=100,000 COLONIES/mL PROVIDENCIA RETTGERI    [x]  Treated with cephalexin, organism resistant to prescribed antimicrobial  New antibiotic prescription: STOP cephalexin, do not treat  ED Provider: Estelle June, DO  Lennie Muckle, PharmD PGY1 Pharmacy Resident 02/28/2023 8:36 AM

## 2023-02-28 NOTE — Telephone Encounter (Signed)
Post ED Visit - Positive Culture Follow-up  Culture report reviewed by antimicrobial stewardship pharmacist: [facility] []  Enzo Bi, Pharm.D. []  Celedonio Miyamoto, Pharm.D., BCPS AQ-ID []  Garvin Fila, Pharm.D., BCPS []  Georgina Pillion, Pharm.D., BCPS []  [place], [place].D., BCPS, AAHIVP []  Estella Husk, Pharm.D., BCPS, AAHIVP []  Lysle Pearl, PharmD, BCPS []  Phillips Climes, PharmD, BCPS []  Agapito Games, PharmD, BCPS []  Verlan Friends, PharmD []  Mervyn Gay, PharmD, BCPS [x]  Vladimir Creeks, PharmD  [facility] []  Len Childs, PharmD []  Greer Pickerel, PharmD []  Adalberto Cole, PharmD []  Perlie Gold, Rph []  Lonell Face) Jean Rosenthal, PharmD []  Earl Many, PharmD []  Junita Push, PharmD []  Dorna Leitz, PharmD []  Terrilee Files, PharmD []  Lynann Beaver, PharmD []  Keturah Barre, PharmD []  Loralee Pacas, PharmD []  Bernadene Person, PharmD   Positive urine culture Treated with Cephalexin.  Spoke to pt. Advised to stop taking Cephalexin. Pt understands instructions. No further treatment needed per Estelle June, DO Patsey Berthold 02/28/2023, 8:54 AM

## 2023-03-08 DIAGNOSIS — Z743 Need for continuous supervision: Secondary | ICD-10-CM | POA: Diagnosis not present

## 2023-03-08 DIAGNOSIS — T83011A Breakdown (mechanical) of indwelling urethral catheter, initial encounter: Secondary | ICD-10-CM | POA: Diagnosis not present

## 2023-03-08 DIAGNOSIS — T83198A Other mechanical complication of other urinary devices and implants, initial encounter: Secondary | ICD-10-CM | POA: Diagnosis not present

## 2023-03-08 DIAGNOSIS — Z7401 Bed confinement status: Secondary | ICD-10-CM | POA: Diagnosis not present

## 2023-03-08 DIAGNOSIS — R531 Weakness: Secondary | ICD-10-CM | POA: Diagnosis not present

## 2023-03-08 DIAGNOSIS — T83098A Other mechanical complication of other indwelling urethral catheter, initial encounter: Secondary | ICD-10-CM | POA: Diagnosis not present

## 2023-03-08 DIAGNOSIS — R5381 Other malaise: Secondary | ICD-10-CM | POA: Diagnosis not present

## 2023-03-15 ENCOUNTER — Ambulatory Visit: Payer: 59

## 2023-03-15 DIAGNOSIS — R339 Retention of urine, unspecified: Secondary | ICD-10-CM | POA: Diagnosis not present

## 2023-03-15 MED ORDER — CIPROFLOXACIN HCL 500 MG PO TABS
500.0000 mg | ORAL_TABLET | Freq: Once | ORAL | Status: AC
Start: 2023-03-15 — End: 2023-03-15
  Administered 2023-03-15: 500 mg via ORAL

## 2023-03-15 NOTE — Progress Notes (Signed)
Suprapubic Cath Change  Patient is present today for a suprapubic catheter change due to urinary retention.  10ml of water was drained from the balloon, a 20FR foley cath was removed from the tract without difficulty.  Site was cleaned and prepped in a sterile fashion with betadine.  A 22FR foley cath was replaced into the tract no complications were noted. Urine return was noted, 10 ml of sterile water was inflated into the balloon and a leg bag was attached for drainage.  Patient tolerated well. A night bag was given to patient and proper instruction was given on how to switch bags.    Performed by: Miniya Miguez LPN  Follow up: keep scheduled NV

## 2023-03-15 NOTE — Patient Instructions (Signed)

## 2023-03-22 ENCOUNTER — Other Ambulatory Visit: Payer: Self-pay | Admitting: Urology

## 2023-04-11 ENCOUNTER — Ambulatory Visit (INDEPENDENT_AMBULATORY_CARE_PROVIDER_SITE_OTHER): Payer: 59

## 2023-04-11 DIAGNOSIS — Z9889 Other specified postprocedural states: Secondary | ICD-10-CM

## 2023-04-11 DIAGNOSIS — R339 Retention of urine, unspecified: Secondary | ICD-10-CM | POA: Diagnosis not present

## 2023-04-11 NOTE — Progress Notes (Signed)
Suprapubic Cath Change  Patient is present today for a suprapubic catheter change due to urinary retention.  10ml of water was drained from the balloon, a 22FR foley cath was removed from the tract with out difficulty.  Site was cleaned and prepped in a sterile fashion with betadine.  A 22FR foley cath was replaced into the tract no complications were noted. Urine return was noted, 10 ml of sterile water was inflated into the balloon and a leg bag was attached for drainage.  Patient tolerated well.   Performed by: Alfonse Spruce. CMA  Follow up: As scheduled

## 2023-04-12 IMAGING — MR MR CERVICAL SPINE WO/W CM
8 of 10 series · 31 of 48 positions shown · IV contrast (gadavist)
Comparison: 02/02/2019

CLINICAL DATA: Nontraumatic ataxia with cervical pathology
suspected. Multiple sclerosis.

EXAM:
MRI HEAD WITHOUT AND WITH CONTRAST
MRI CERVICAL SPINE WITHOUT AND WITH CONTRAST
TECHNIQUE: Multiplanar, multiecho pulse sequences of the brain and surrounding
structures, and cervical spine, to include the craniocervical
junction and cervicothoracic junction, were obtained without and
with intravenous contrast.
CONTRAST:  7mL GADAVIST GADOBUTROL 1 MMOL/ML IV SOLN

[Series 5: T1 · sagittal · 3.0mm · 0.69mm/px · 2 of 15 slices shown (1 of 2)]
[im 1/15]
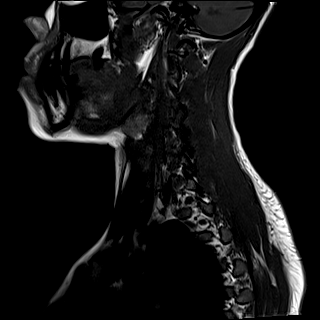
[im 15/15]
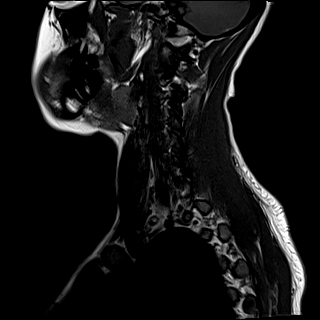

[Series 6: T2 · sagittal · 3.0mm · 0.69mm/px · 2 of 15 slices shown (1 of 2)]
[im 1/15]
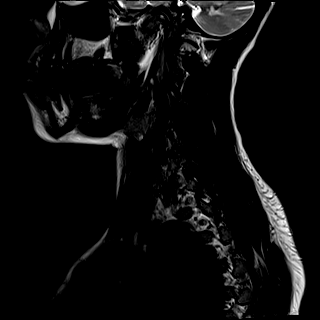
[im 15/15]
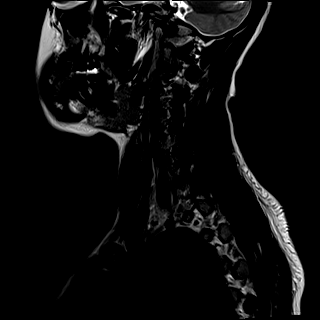

[Series 7: STIR · sagittal · 3.0mm · 0.86mm/px · 3 of 15 slices shown]
[im 1/15]
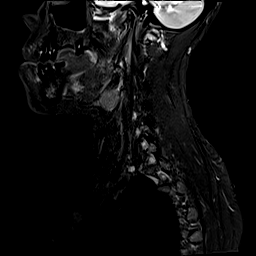
[im 8/15]
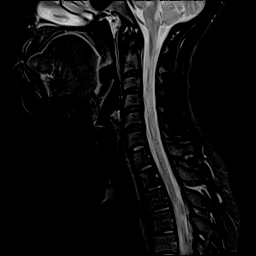
[im 15/15]
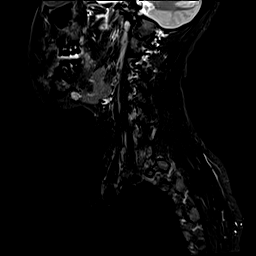

[Series 8: T2 · axial · 3.0mm · 0.70mm/px · z∈[-168,-40]mm · 7 of 38 slices shown (2 of 2)]
[im 1/38]
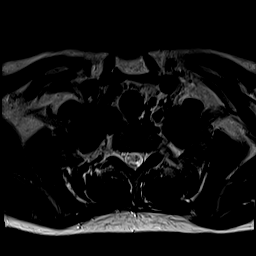
[im 7/38]
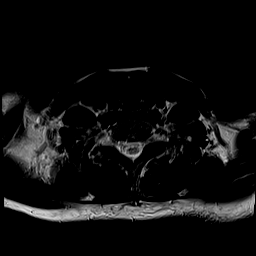
[im 13/38]
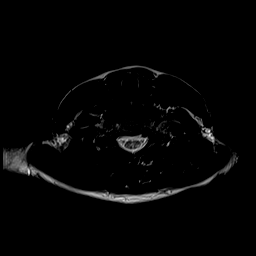
[im 19/38]
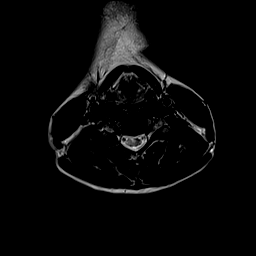
[im 25/38]
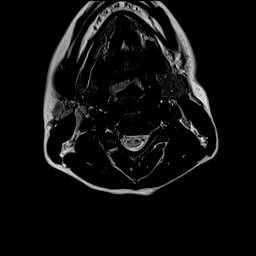
[im 31/38]
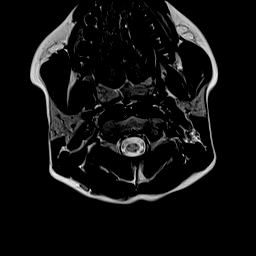
[im 38/38]
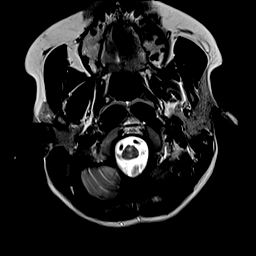

[Series 10: T1 · axial · 3.0mm · 0.35mm/px · z∈[-168,-40]mm · 7 of 38 slices shown (2 of 2)]
[im 1/38]
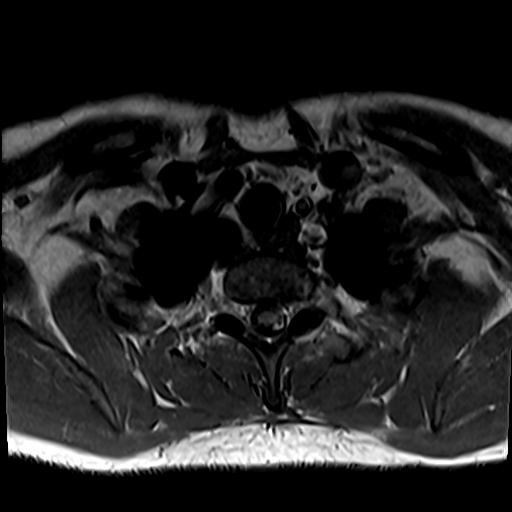
[im 7/38]
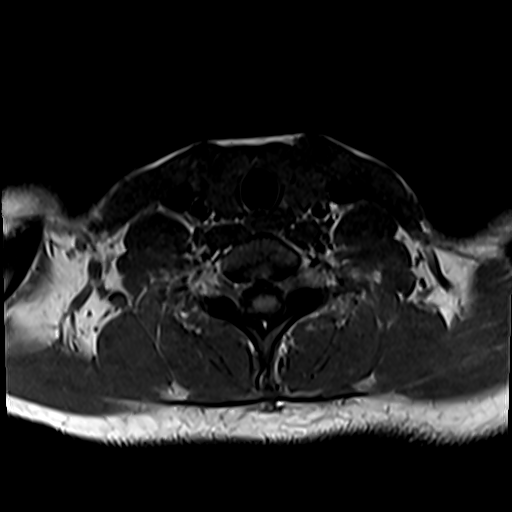
[im 13/38]
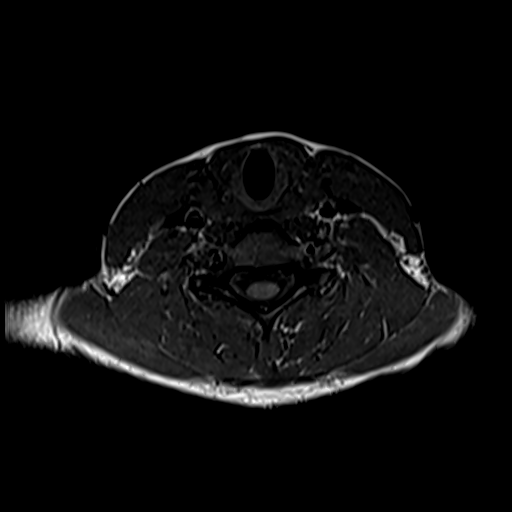
[im 19/38]
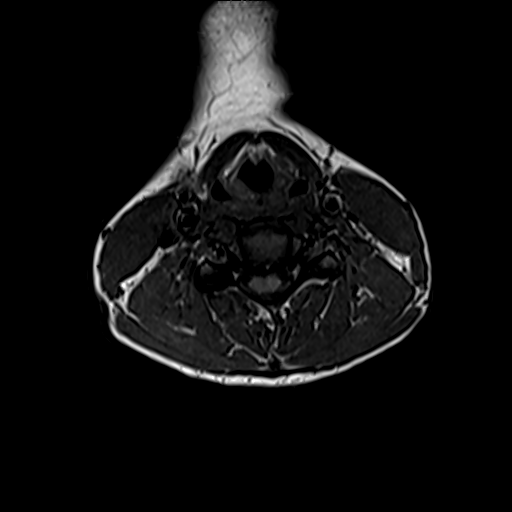
[im 25/38]
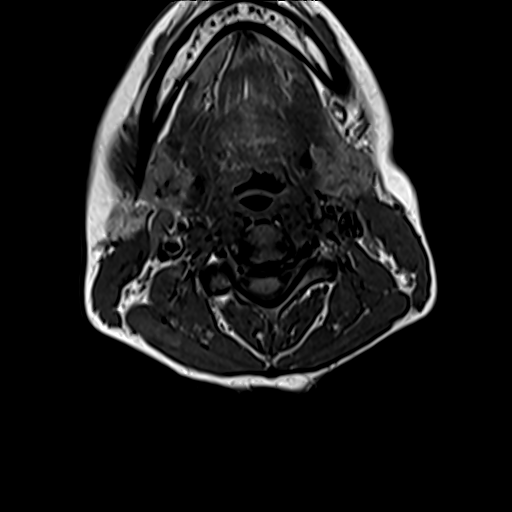
[im 31/38]
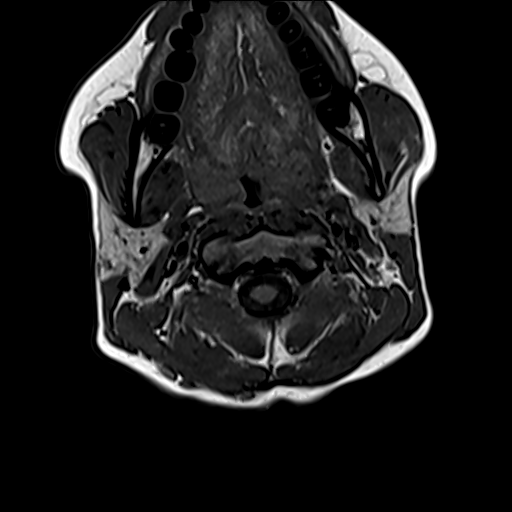
[im 38/38]
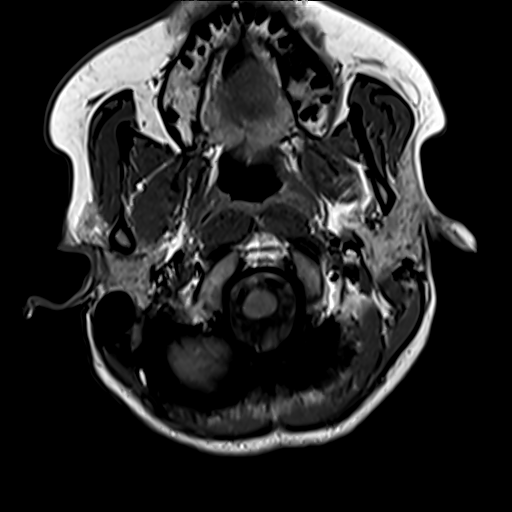

[Series 11: T2 post-contrast · sagittal · 3.0mm · 0.69mm/px · 3 of 15 slices shown]
[im 1/15]
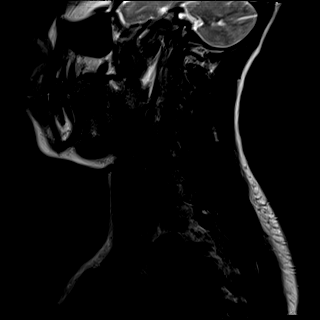
[im 8/15]
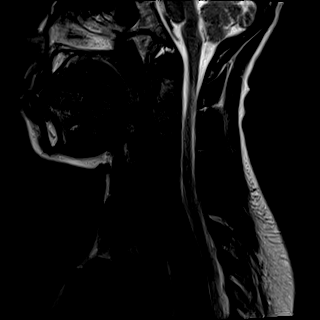
[im 15/15]
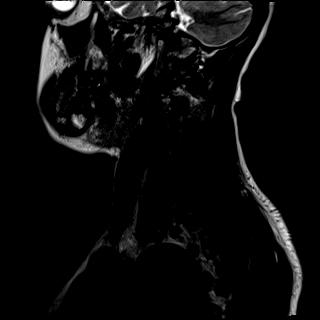

[Series 12: T1 fat-sat post-contrast · sagittal · 3.0mm · 0.69mm/px · 3 of 15 slices shown]
[im 1/15]
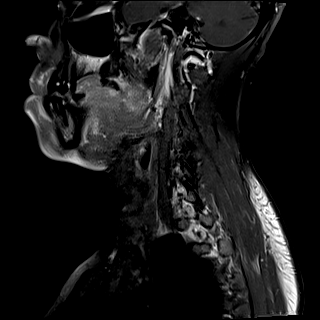
[im 8/15]
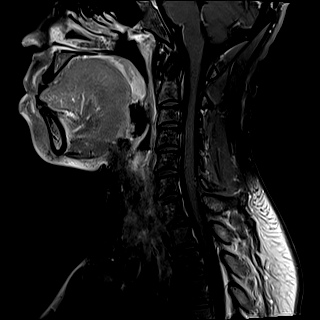
[im 15/15]
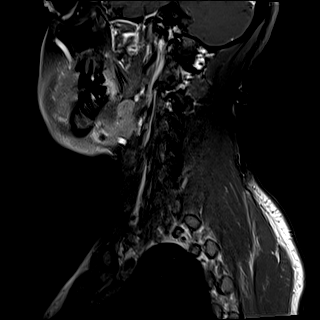

[Series 13: T1 post-contrast · axial · 3.0mm · 0.35mm/px · z∈[-168,-105]mm · 4 of 38 slices shown]
[im 1/38]
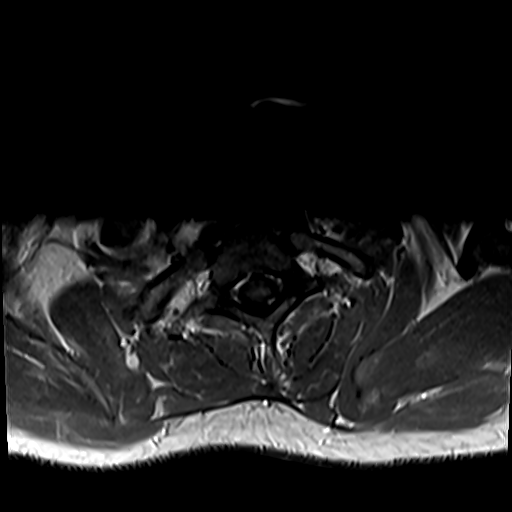
[im 7/38]
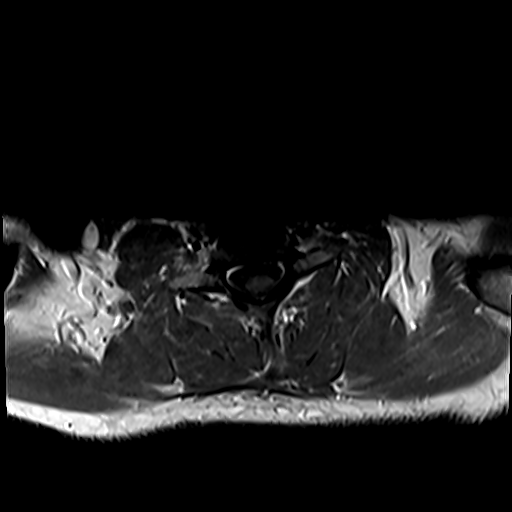
[im 13/38]
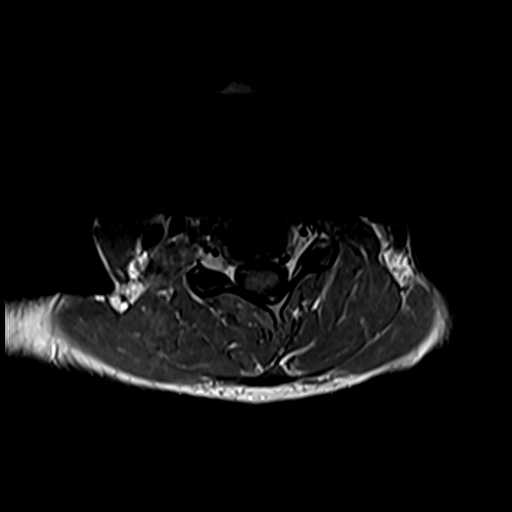
[im 19/38]
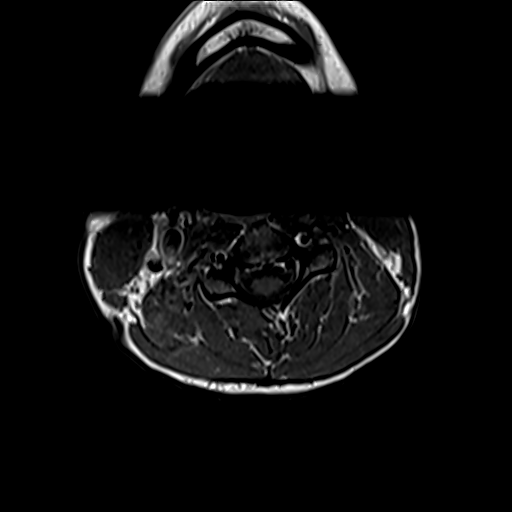

[31 of 48 positions shown; findings below may reference images not displayed]

FINDINGS: MRI HEAD FINDINGS

Brain: Pattern of severe chronic demyelination with T2
hyperintensities in the brainstem, deep cerebellum, periventricular
white matter, juxtacortical regions. Multiple areas of black hole
from dense chronic demyelination with paucity of central white
matter with corpus callosum thinning which which is likely also
related to congenital dysmorphism. The shape of the ventricles,
absence of the septum pellucidum, and small optic nerves is
compatible with septal optic dysplasia. There is also polymicrogyria
symmetrically along accentuated lateral frontal sulci. No complete
brain cleft. No abnormal enhancement or restricted diffusion. No
convincing change from prior, but the confluent disease limits
detection of new areas.

Vascular: Major flow voids and vascular enhancements are preserved.

Skull and upper cervical spine: Normal marrow signal

Sinuses/Orbits: Negative

Other: Motion degraded postcontrast imaging.

MRI CERVICAL SPINE FINDINGS

Alignment: Normal

Vertebrae: No fracture, evidence of discitis, or bone lesion.

Cord: Heterogeneously confluent T2 hyperintensity essentially
involving the entire visualized cervical and upper thoracic cord,
progressed from prior where there were areas of normal appearing
spinal cord signal. No noted interval thinning or abnormal
enhancement.

Posterior Fossa, vertebral arteries, paraspinal tissues: Negative

Disc levels: No degenerative changes or impingement
IMPRESSION: Brain MRI:

1. Severe chronic demyelination without enhancing disease or clear
progression.
2. Background polymicrogyria and features of septo-optic dysplasia.

Cervical MRI:

Confluent demyelination of the visible cord, subjectively progressed
from 7878. No enhancing disease.

## 2023-04-12 IMAGING — MR MR HEAD WO/W CM
14 series · 48 of 48 positions shown · IV contrast (gadavist)
Comparison: 02/02/2019

CLINICAL DATA: Nontraumatic ataxia with cervical pathology
suspected. Multiple sclerosis.

EXAM:
MRI HEAD WITHOUT AND WITH CONTRAST
MRI CERVICAL SPINE WITHOUT AND WITH CONTRAST
TECHNIQUE: Multiplanar, multiecho pulse sequences of the brain and surrounding
structures, and cervical spine, to include the craniocervical
junction and cervicothoracic junction, were obtained without and
with intravenous contrast.
CONTRAST:  7mL GADAVIST GADOBUTROL 1 MMOL/ML IV SOLN

[Series 5: DWI · axial · 3.0mm · 1.36mm/px · z∈[-56,+84]mm · 6 of 96 slices shown (1 of 2)]
[im 1/96]
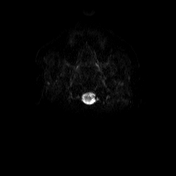
[im 20/96]
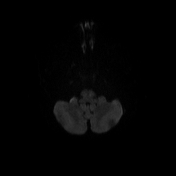
[im 39/96]
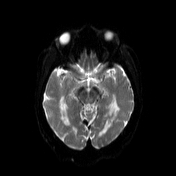
[im 58/96]
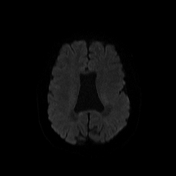
[im 77/96]
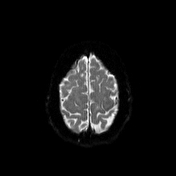
[im 96/96]
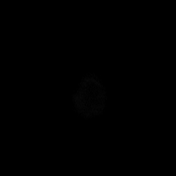

[Series 6: DWI · axial · 3.0mm · 1.36mm/px · z∈[-56,+84]mm · 3 of 48 slices shown (2 of 2)]
[im 1/48]
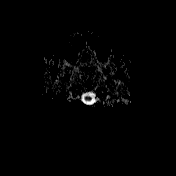
[im 24/48]
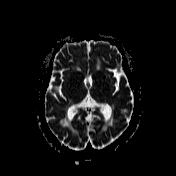
[im 48/48]
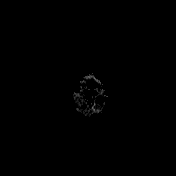

[Series 7: T1 · sagittal · 5.0mm · 0.75mm/px · 1 of 24 slices shown (1 of 2)]
[im 1/24]
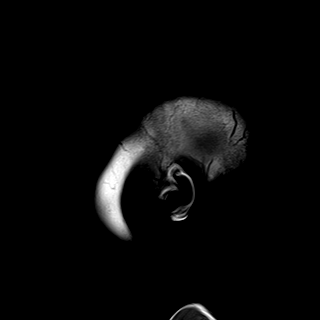

[Series 8: T2 · axial · 5.0mm · 0.62mm/px · 1 of 22 slices shown]
[im 1/22]
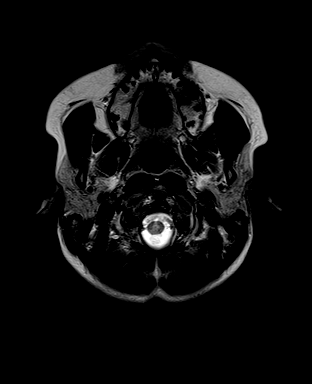

[Series 9: swi_images · axial · 3.0mm · 0.75mm/px · z∈[-91,+121]mm · 4 of 72 slices shown]
[im 1/72]
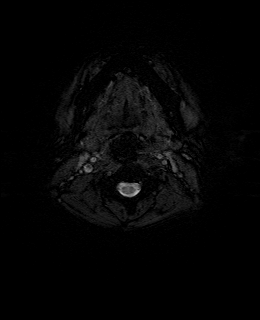
[im 24/72]
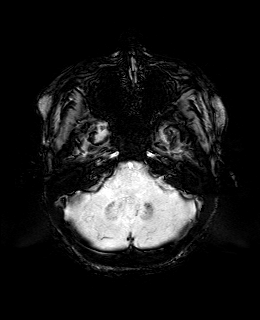
[im 48/72]
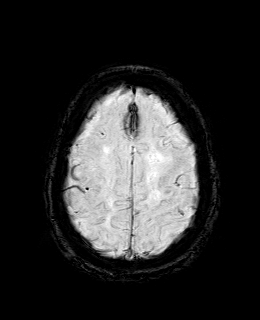
[im 72/72]
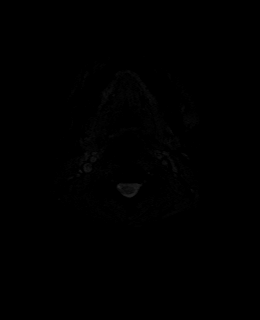

[Series 11: FLAIR · axial · 3.0mm · 0.75mm/px · z∈[-61,+91]mm · 3 of 52 slices shown (1 of 2)]
[im 1/52]
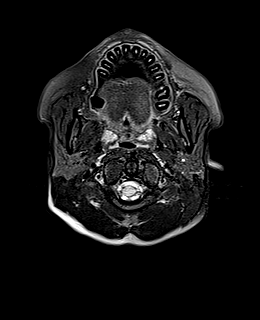
[im 26/52]
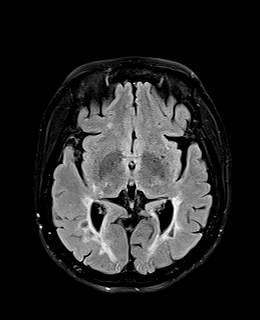
[im 52/52]
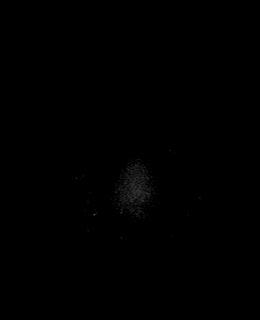

[Series 12: T1 · axial · 1.0mm · 0.94mm/px · z∈[-56,+86]mm · 8 of 144 slices shown (2 of 2)]
[im 1/144]
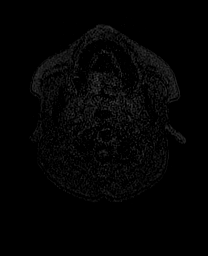
[im 21/144]
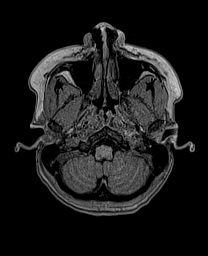
[im 41/144]
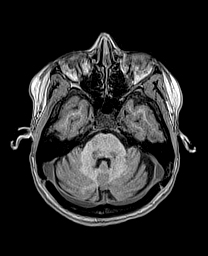
[im 62/144]
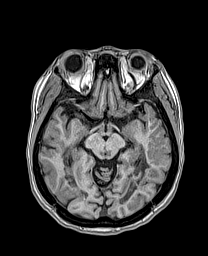
[im 82/144]
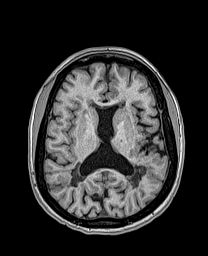
[im 103/144]
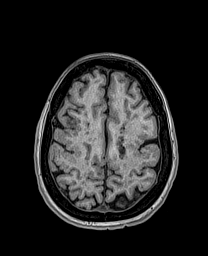
[im 123/144]
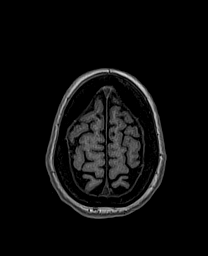
[im 144/144]
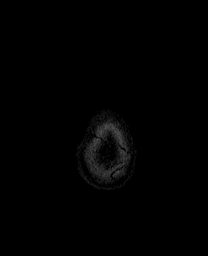

[Series 13: cor dwi_tracew · coronal · 5.0mm · 1.53mm/px · 3 of 52 slices shown]
[im 1/52]
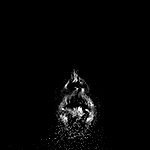
[im 26/52]
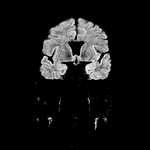
[im 52/52]
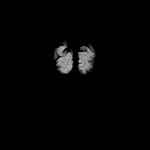

[Series 14: cor dwi_adc · coronal · 5.0mm · 1.53mm/px · 1 of 25 slices shown]
[im 1/25]
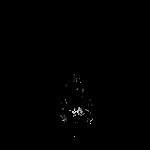

[Series 15: FLAIR · sagittal · 1.1mm · 0.50mm/px · 7 of 128 slices shown (2 of 2)]
[im 1/128]
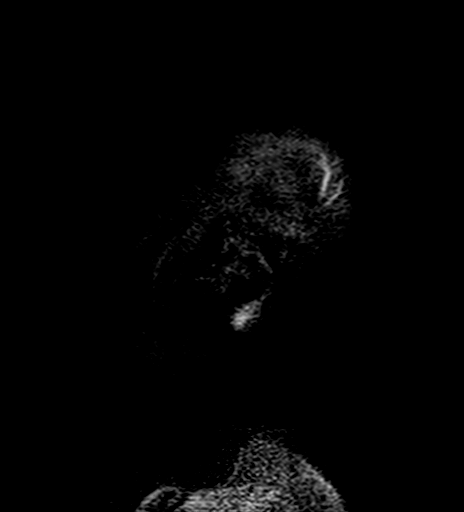
[im 22/128]
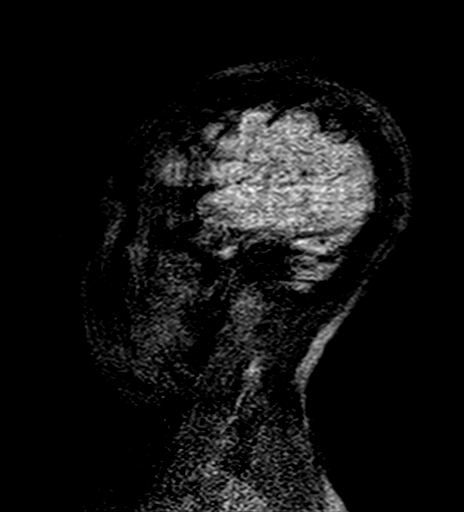
[im 43/128]
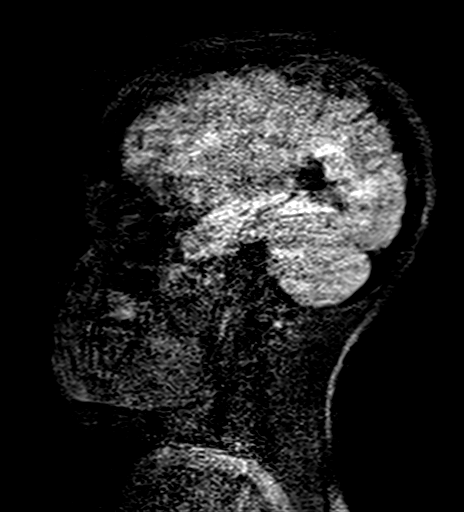
[im 64/128]
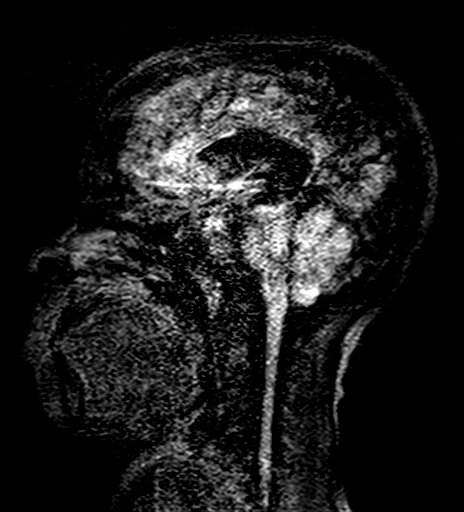
[im 85/128]
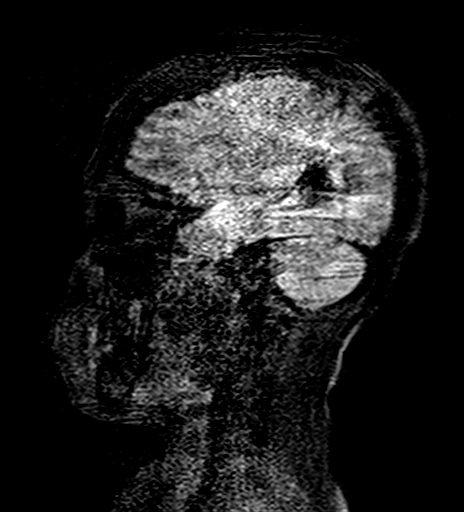
[im 106/128]
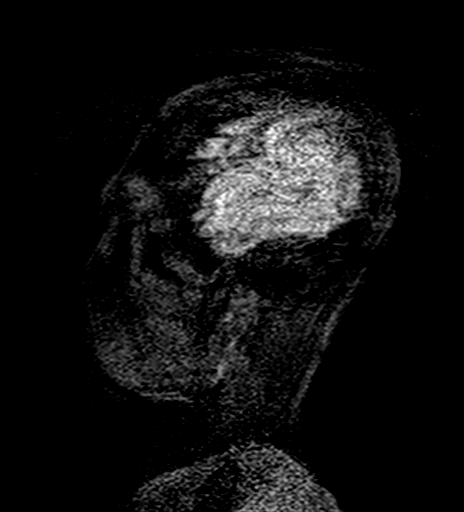
[im 128/128]
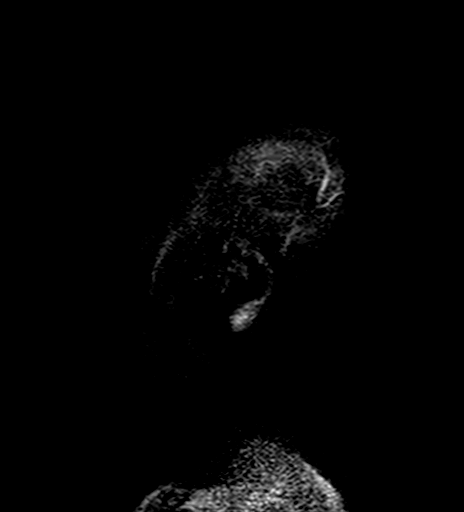

[Series 16: T2 post-contrast · coronal · 5.0mm · 0.57mm/px · 1 of 26 slices shown]
[im 1/26]
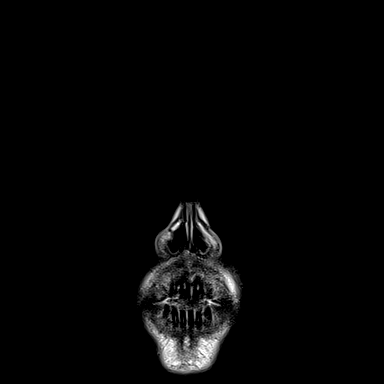

[Series 17: T1 post-contrast · axial · 1.0mm · 0.94mm/px · z∈[-56,+86]mm · 8 of 144 slices shown (1 of 3)]
[im 1/144]
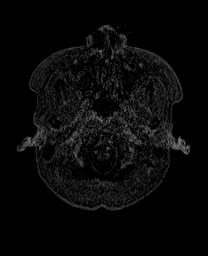
[im 21/144]
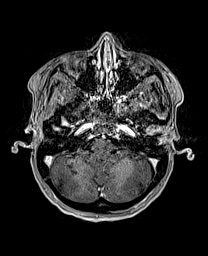
[im 41/144]
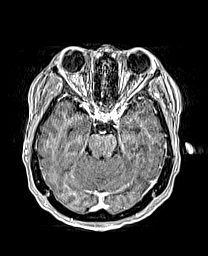
[im 62/144]
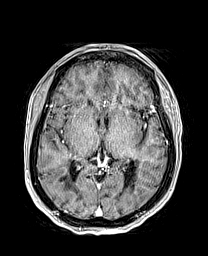
[im 82/144]
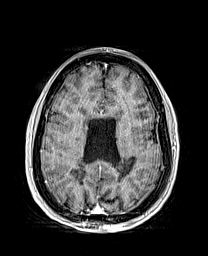
[im 103/144]
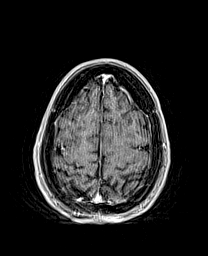
[im 123/144]
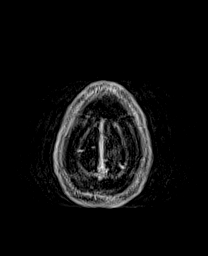
[im 144/144]
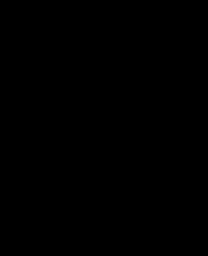

[Series 18: T1 post-contrast · coronal · 5.0mm · 0.43mm/px · 1 of 26 slices shown (2 of 3)]
[im 1/26]
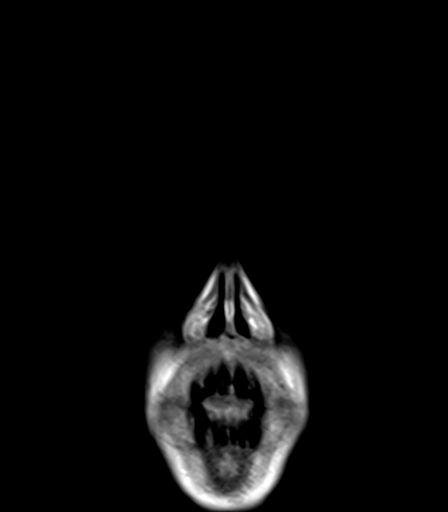

[Series 19: T1 post-contrast · sagittal · 5.0mm · 0.75mm/px · 1 of 24 slices shown (3 of 3)]
[im 1/24]
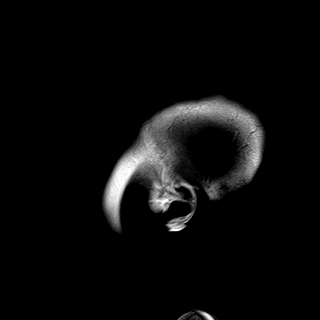

[48 of 48 positions shown; findings below may reference images not displayed]

FINDINGS: MRI HEAD FINDINGS

Brain: Pattern of severe chronic demyelination with T2
hyperintensities in the brainstem, deep cerebellum, periventricular
white matter, juxtacortical regions. Multiple areas of black hole
from dense chronic demyelination with paucity of central white
matter with corpus callosum thinning which which is likely also
related to congenital dysmorphism. The shape of the ventricles,
absence of the septum pellucidum, and small optic nerves is
compatible with septal optic dysplasia. There is also polymicrogyria
symmetrically along accentuated lateral frontal sulci. No complete
brain cleft. No abnormal enhancement or restricted diffusion. No
convincing change from prior, but the confluent disease limits
detection of new areas.

Vascular: Major flow voids and vascular enhancements are preserved.

Skull and upper cervical spine: Normal marrow signal

Sinuses/Orbits: Negative

Other: Motion degraded postcontrast imaging.

MRI CERVICAL SPINE FINDINGS

Alignment: Normal

Vertebrae: No fracture, evidence of discitis, or bone lesion.

Cord: Heterogeneously confluent T2 hyperintensity essentially
involving the entire visualized cervical and upper thoracic cord,
progressed from prior where there were areas of normal appearing
spinal cord signal. No noted interval thinning or abnormal
enhancement.

Posterior Fossa, vertebral arteries, paraspinal tissues: Negative

Disc levels: No degenerative changes or impingement
IMPRESSION: Brain MRI:

1. Severe chronic demyelination without enhancing disease or clear
progression.
2. Background polymicrogyria and features of septo-optic dysplasia.

Cervical MRI:

Confluent demyelination of the visible cord, subjectively progressed
from 7878. No enhancing disease.

## 2023-04-21 ENCOUNTER — Emergency Department (HOSPITAL_COMMUNITY): Payer: 59

## 2023-04-21 ENCOUNTER — Encounter (HOSPITAL_COMMUNITY): Payer: Self-pay

## 2023-04-21 ENCOUNTER — Emergency Department (HOSPITAL_COMMUNITY)
Admission: EM | Admit: 2023-04-21 | Discharge: 2023-04-21 | Disposition: A | Discharge status: Home / Self Care | Payer: 59 | Attending: Emergency Medicine | Admitting: Emergency Medicine

## 2023-04-21 ENCOUNTER — Other Ambulatory Visit: Payer: Self-pay

## 2023-04-21 DIAGNOSIS — G4489 Other headache syndrome: Secondary | ICD-10-CM | POA: Diagnosis not present

## 2023-04-21 DIAGNOSIS — G43809 Other migraine, not intractable, without status migrainosus: Secondary | ICD-10-CM

## 2023-04-21 DIAGNOSIS — R6889 Other general symptoms and signs: Secondary | ICD-10-CM | POA: Diagnosis not present

## 2023-04-21 DIAGNOSIS — Z7401 Bed confinement status: Secondary | ICD-10-CM | POA: Diagnosis not present

## 2023-04-21 DIAGNOSIS — Z743 Need for continuous supervision: Secondary | ICD-10-CM | POA: Diagnosis not present

## 2023-04-21 DIAGNOSIS — G35 Multiple sclerosis: Secondary | ICD-10-CM | POA: Diagnosis not present

## 2023-04-21 DIAGNOSIS — R519 Headache, unspecified: Secondary | ICD-10-CM | POA: Diagnosis not present

## 2023-04-21 LAB — BASIC METABOLIC PANEL
Anion gap: 8 (ref 5–15)
BUN: 11 mg/dL (ref 6–20)
CO2: 24 mmol/L (ref 22–32)
Calcium: 8.5 mg/dL — ABNORMAL LOW (ref 8.9–10.3)
Chloride: 101 mmol/L (ref 98–111)
Creatinine, Ser: 0.68 mg/dL (ref 0.44–1.00)
GFR, Estimated: >60 mL/min (ref >60)
Glucose, Bld: 91 mg/dL (ref 70–99)
Potassium: 4.2 mmol/L (ref 3.5–5.1)
Sodium: 133 mmol/L — ABNORMAL LOW (ref 135–145)

## 2023-04-21 LAB — CBC
HCT: 36.5 % (ref 36.0–46.0)
Hemoglobin: 11.4 g/dL — ABNORMAL LOW (ref 12.0–15.0)
MCH: 26.6 pg (ref 26.0–34.0)
MCHC: 31.2 g/dL (ref 30.0–36.0)
MCV: 85.1 fL (ref 80.0–100.0)
Platelets: 229 10*3/uL (ref 150–400)
RBC: 4.29 MIL/uL (ref 3.87–5.11)
RDW: 16.3 % — ABNORMAL HIGH (ref 11.5–15.5)
WBC: 7 10*3/uL (ref 4.0–10.5)
nRBC: 0 % (ref 0.0–0.2)

## 2023-04-21 LAB — HCG, SERUM, QUALITATIVE: Preg, Serum: NEGATIVE

## 2023-04-21 MED ORDER — METOCLOPRAMIDE HCL 10 MG PO TABS
10.0000 mg | ORAL_TABLET | Freq: Once | ORAL | Status: AC
  Administered 2023-04-21: 10 mg via ORAL
  Filled 2023-04-21: qty 1

## 2023-04-21 MED ORDER — ACETAMINOPHEN 500 MG PO TABS
1000.0000 mg | ORAL_TABLET | Freq: Once | ORAL | Status: AC
  Administered 2023-04-21: 1000 mg via ORAL
  Filled 2023-04-21: qty 2

## 2023-04-21 MED ORDER — GADOBUTROL 1 MMOL/ML IV SOLN
5.0000 mL | Freq: Once | INTRAVENOUS | Status: AC | PRN
  Administered 2023-04-21: 5 mL via INTRAVENOUS

## 2023-04-21 MED ORDER — METOCLOPRAMIDE HCL 10 MG PO TABS: 10.0000 mg | ORAL_TABLET | Freq: Four times a day (QID) | ORAL | 0 refills | Status: DC

## 2023-04-21 MED ORDER — DIPHENHYDRAMINE HCL 50 MG/ML IJ SOLN
25.0000 mg | Freq: Once | INTRAMUSCULAR | Status: AC
  Administered 2023-04-21: 25 mg via INTRAVENOUS
  Filled 2023-04-21: qty 1

## 2023-04-21 NOTE — ED Triage Notes (Signed)
 Pt complains of headache x 2 days. No cough, fever, or other symptoms.

## 2023-04-21 NOTE — ED Notes (Signed)
 Pt repositioned multiple times in attempt for comfort.

## 2023-04-21 NOTE — Discharge Instructions (Signed)
 You were seen for your headache in the emergency department.  You were given medicines which improved your symptoms.  At home, please take Tylenol  and ibuprofen  for your headache.  You may also take the Reglan  we have prescribed you for your headache or any nausea or vomiting that you have.  You may take this with Benadryl  for severe headaches but please note that this will make you drowsy.    Check your MyChart online for the results of any tests that had not resulted by the time you left the emergency department.   Follow-up with your primary doctor in 2-3 days regarding your visit.  Follow-up with your neurologist as well.  Return immediately to the emergency department if you experience any of the following: Vision changes, numbness or weakness of your arms or legs, or any other concerning symptoms.    Thank you for visiting our Emergency Department. It was a pleasure taking care of you today.

## 2023-04-21 NOTE — ED Notes (Signed)
 Transport here to take pt back home

## 2023-04-21 NOTE — ED Provider Notes (Signed)
[place] EMERGENCY DEPARTMENT AT [facility] Provider Note   [name]: [id number] Arrival date & time: 04/21/23  1300     History  Chief Complaint  Patient presents with   Headache    X two days    Melissa West is a 33 y.o. female.  33 year old female with a history of MS who is bedbound due to lower extremity weakness and with suprapubic catheter who presents to the emergency department with headache.  Says that over the past 2 days she has been having headache that is both frontal and occipital.  Has difficulty characterizing.  Initially was a 3/10 in severity and then has built up to a 9/10 in severity gradually.  Denies any new neurologic symptoms.  Based on her prior neurology note does have disconjugate gaze at his baseline, ataxia on the right hand, nystagmus when looking in all directions, and desaturated color with contractures and weakness of her lower extremities.  Has been on  Kesimpta  since 2023 per neuro notes but denies taking any medications to me.        Home Medications Prior to Admission medications   Medication Sig Start Date End Date Taking? Authorizing Provider  metoCLOPramide  (REGLAN ) 10 MG tablet Take 1 tablet (10 mg total) by mouth every 6 (six) hours. 04/21/23  Yes Yolande Lamar BROCKS, MD  acetaminophen  (TYLENOL ) 325 MG tablet Take 2 tablets (650 mg total) by mouth every 6 (six) hours as needed for mild pain (or Fever >/= 101). 05/20/21   Pearlean Manus, MD  acetic acid  0.25 % irrigation IRRIGATE WITH AS DIRECTED DAILY 03/22/23   [name], Belvie CROME, MD  amantadine  (SYMMETREL ) 100 MG capsule Take 1 capsule (100 mg total) by mouth 2 (two) times daily. 01/26/23   Sater, Charlie LABOR, MD  AMBULATORY NON FORMULARY MEDICATION 30 mLs by Intracatheter route daily as needed. Medication Name: Sodium Chloride  0.9% 11/12/21   [name], Belvie CROME, MD  AMBULATORY NON FORMULARY MEDICATION Medication Name: Bedside catheter bag 08/12/22   Watt Rush, MD  baclofen   (LIORESAL ) 10 MG tablet Take 1 tablet (10 mg total) by mouth 3 (three) times daily. 10/18/22   Sater, Charlie LABOR, MD  KESIMPTA  20 MG/0.4ML SOAJ Inject 20 mg into the skin every 30 (thirty) days. 06/24/21   [provider]  meclizine  (ANTIVERT ) 25 MG tablet Take 1 tablet (25 mg total) by mouth 3 (three) times daily as needed for dizziness. 12/29/22   Suzette Pac, MD  Syringe, Disposable, (50-60CC SYRINGE) 60 ML MISC Flush 30-50cc of sterile water  into catheter daily as needed 01/15/22   Sherrilee Belvie CROME, MD  Water  For Irrigation, Sterile (STERILE WATER  FOR IRRIGATION) Irrigate with 50 mLs as directed daily. Irrigate catheter daily with 50cc of sterile water . 01/15/22   [name], Belvie CROME, MD      Allergies    Patient has no known allergies.    Review of Systems   Review of Systems  Physical Exam Updated Vital Signs BP 100/73 (BP Location: Right Arm)   Pulse 98   Temp 98 F (36.7 C) (Oral)   Resp 16   Ht 5' 2 (1.575 m)   Wt 44.5 kg   LMP 04/12/2023 (Approximate)   SpO2 100%   BMI 17.94 kg/m  Physical Exam Vitals and nursing note reviewed.  Constitutional:      General: She is not in acute distress.    Appearance: She is well-developed.  HENT:     Head: Normocephalic and atraumatic.  Right Ear: External ear normal.     Left Ear: External ear normal.     Nose: Nose normal.  Eyes:     Conjunctiva/sclera: Conjunctivae normal.     Pupils: Pupils are equal, round, and reactive to light.     Comments: Disconjugate gaze  Neck:     Comments: No meningismus neck supple.  No pain with ranging neck. Cardiovascular:     Rate and Rhythm: Normal rate and regular rhythm.     Heart sounds: No murmur heard. Pulmonary:     Effort: Pulmonary effort is normal. No respiratory distress.     Breath sounds: Normal breath sounds.  Musculoskeletal:     Cervical back: Normal range of motion and neck supple.     Comments: Contractures of bilateral lower extremities  Skin:     General: Skin is warm and dry.  Neurological:     Mental Status: She is alert and oriented to person, place, and time. Mental status is at baseline.     Comments: MENTAL STATUS: AAOx3 CRANIAL NERVES: II: Pupils equal and reactive 4 mm BL, no RAPD, no VF deficits III, IV, VI: Nystagmus in all directions with gaze V: normal sensation to light touch in V1, V2, and V3 segments bilaterally VII: no facial weakness or asymmetry, no nasolabial fold flattening VIII: normal hearing to speech and finger friction IX, X: normal palatal elevation, no uvular deviation XI: 5/5 head turn and 5/5 shoulder shrug bilaterally XII: midline tongue protrusion MOTOR: Weakness of right upper extremity 4/5.  Left upper extremity 5/5.  Contractures of bilateral lower extremities SENSORY: Normal sensation to light touch in all extremities COORD: Ataxia with right upper extremity.  Mild ataxia with left upper extremity as well   Psychiatric:        Mood and Affect: Mood normal.     ED Results / Procedures / Treatments   Labs (all labs ordered are listed, but only abnormal results are displayed) Labs Reviewed  BASIC METABOLIC PANEL - Abnormal; Notable for the following components:      Result Value   Sodium 133 (*)    Calcium  8.5 (*)    All other components within normal limits  CBC - Abnormal; Notable for the following components:   Hemoglobin 11.4 (*)    RDW 16.3 (*)    All other components within normal limits  HCG, SERUM, QUALITATIVE    EKG None  Radiology MR Brain W and Wo Contrast Result Date: 04/21/2023 CLINICAL DATA:  Headache, history of multiple sclerosis EXAM: MRI HEAD WITHOUT AND WITH CONTRAST TECHNIQUE: Multiplanar, multiecho pulse sequences of the brain and surrounding structures were obtained without and with intravenous contrast. CONTRAST:  5mL GADAVIST  GADOBUTROL  1 MMOL/ML IV SOLN COMPARISON:  12/29/2022 MRI head FINDINGS: Brain: No restricted diffusion to suggest acute or subacute  infarct. No abnormal parenchymal or meningeal enhancement. Redemonstrated extensive T2 hyperintense signal abnormality in the periventricular, juxtacortical, and infratentorial white matter. No definite new lesions are seen. No acute hemorrhage, mass, mass effect, or midline shift. No hydrocephalus or extra-axial collection. Unchanged appearance of the pituitary gland. Craniocervical junction within normal limits. The corpus callosum is thin. Absence of the septum pellucidum. Vascular: Normal arterial flow voids. Normal arterial and venous enhancement. Skull and upper cervical spine: Normal marrow signal. Sinuses/Orbits: Clear paranasal sinuses. No acute finding in the orbits. Other: Trace fluid in left mastoid air cells. IMPRESSION: 1. No acute intracranial process. No evidence of acute or subacute infarct. 2. Redemonstrated extensive T2 hyperintense signal  abnormality in the periventricular, juxtacortical, and infratentorial white matter, consistent with the patient's history of multiple sclerosis. No definite new lesions are seen. No evidence of active demyelination. Electronically Signed   By: Donald Campion M.D.   On: 04/21/2023 19:00    Procedures Procedures    Medications Ordered in ED Medications  metoCLOPramide  (REGLAN ) tablet 10 mg (10 mg Oral Given 04/21/23 1637)  diphenhydrAMINE  (BENADRYL ) injection 25 mg (25 mg Intravenous Given 04/21/23 1636)  gadobutrol  (GADAVIST ) 1 MMOL/ML injection 5 mL (5 mLs Intravenous Contrast Given 04/21/23 1826)  acetaminophen  (TYLENOL ) tablet 1,000 mg (1,000 mg Oral Given 04/21/23 1956)    ED Course/ Medical Decision Making/ A&P                                 Medical Decision Making Amount and/or Complexity of Data Reviewed Labs: ordered. Radiology: ordered.  Risk OTC drugs. Prescription drug management.   Melissa West is a 33 y.o. female with comorbidities that complicate the patient evaluation including MS who is bedbound due to lower extremity  weakness and with suprapubic catheter who presents to the emergency department with headache.    Initial Ddx:  Migraine, ICH, MS flare, meningitis, dural sinus thrombosis  MDM/Course:  Patient presents to the emergency department with headache that was gradual in onset over the past 2 days.  Denies any other new neurologic symptoms.  No fever or neck stiffness.  On exam it appears to be at her baseline when I compared to her last neurologic evaluation as an outpatient.  She does not have any meningismus right now had full range of motion of her neck.  Patient was given a migraine cocktail.  MRI with and without of the brain obtained as well to ensure that this was not the presenting signs of an MS flare.  Did not show any new plaques or signs of a new MS flare.  Upon re-evaluation reported that her headache is significantly improved.  Patient discharged home with prescription of Reglan  and instructed to take Tylenol  and ibuprofen  for her pain.  Suspect that she has a migraine.  This patient presents to the ED for concern of complaints listed in HPI, this involves an extensive number of treatment options, and is a complaint that carries with it a high risk of complications and morbidity. Disposition including potential need for admission considered.   Dispo: DC Home. Return precautions discussed including, but not limited to, those listed in the AVS. Allowed pt time to ask questions which were answered fully prior to dc.  Records reviewed Outpatient Clinic Notes The following labs were independently interpreted: Chemistry and show no acute abnormality I personally reviewed and interpreted cardiac monitoring: normal sinus rhythm  I personally reviewed and interpreted the pt's EKG: see above for interpretation  I have reviewed the patients home medications and made adjustments as needed  Portions of this note were generated with Dragon dictation software. Dictation errors may occur despite best  attempts at proofreading.     Final Clinical Impression(s) / ED Diagnoses Final diagnoses:  Other migraine without status migrainosus, not intractable    Rx / DC Orders ED Discharge Orders          Ordered    metoCLOPramide  (REGLAN ) 10 MG tablet  Every 6 hours        04/21/23 1928              Yolande Lamar BROCKS, MD  04/22/23 1853  

## 2023-04-21 NOTE — ED Notes (Signed)
 Pt put on convo list.

## 2023-05-03 ENCOUNTER — Ambulatory Visit (INDEPENDENT_AMBULATORY_CARE_PROVIDER_SITE_OTHER): Payer: 59 | Admitting: Family Medicine

## 2023-05-03 ENCOUNTER — Encounter: Payer: Self-pay | Admitting: Family Medicine

## 2023-05-03 VITALS — BP 100/57 | HR 87 | Ht 62.0 in

## 2023-05-03 DIAGNOSIS — R7301 Impaired fasting glucose: Secondary | ICD-10-CM | POA: Diagnosis not present

## 2023-05-03 DIAGNOSIS — G35 Multiple sclerosis: Secondary | ICD-10-CM | POA: Diagnosis not present

## 2023-05-03 DIAGNOSIS — E559 Vitamin D deficiency, unspecified: Secondary | ICD-10-CM | POA: Diagnosis not present

## 2023-05-03 DIAGNOSIS — E7849 Other hyperlipidemia: Secondary | ICD-10-CM | POA: Diagnosis not present

## 2023-05-03 DIAGNOSIS — E038 Other specified hypothyroidism: Secondary | ICD-10-CM | POA: Diagnosis not present

## 2023-05-03 DIAGNOSIS — R29898 Other symptoms and signs involving the musculoskeletal system: Secondary | ICD-10-CM | POA: Diagnosis not present

## 2023-05-03 NOTE — Progress Notes (Unsigned)
Established Patient Office Visit  Subjective:  Patient ID: Melissa West, female    DOB: [birth year]  Age: 33 y.o. MRN: [id number]  CC:  Chief Complaint  Patient presents with   Follow-up    4 month follow up    HPI Melissa West is a 33 y.o. female with past medical history of MS and  lower extremity weakness presents for f/u of  chronic medical conditions. For the details of today's visit, please refer to the assessment and plan.     Past Medical History:  Diagnosis Date   MS (multiple sclerosis) (HCC)    No pertinent past medical history     Past Surgical History:  Procedure Laterality Date   CESAREAN SECTION  08/09/2011   Procedure: CESAREAN SECTION;  Surgeon: Lesly Dukes, MD;  Location: [facility];  Service: Gynecology;  Laterality: N/A;   IR CATHETER TUBE CHANGE  07/22/2021   IR CATHETER TUBE CHANGE  09/10/2021    Family History  Problem Relation Age of Onset   Diabetes Maternal Grandmother    Hypertension Maternal Grandmother    Kidney disease Maternal Grandmother     Social History   Socioeconomic History   Marital status: Single    Spouse name: Not on file   Number of children: Not on file   Years of education: Not on file   Highest education level: Not on file  Occupational History   Not on file  Tobacco Use   Smoking status: Never   Smokeless tobacco: Never  Vaping Use   Vaping status: Never Used  Substance and Sexual Activity   Alcohol use: No   Drug use: No   Sexual activity: Yes    Birth control/protection: None  Other Topics Concern   Not on file  Social History Narrative   Left handed    Lives with kid   Caffeine use: none   Social Drivers of Corporate investment banker Strain: Not on file  Food Insecurity: No Food Insecurity (10/09/2022)   Hunger Vital Sign    Worried About Running Out of Food in the Last Year: Never true    Ran Out of Food in the Last Year: Never true  Transportation Needs: No Transportation Needs  (10/09/2022)   PRAPARE - Administrator, Civil Service (Medical): No    Lack of Transportation (Non-Medical): No  Physical Activity: Not on file  Stress: Not on file  Social Connections: Not on file  Intimate Partner Violence: Not At Risk (10/09/2022)   Humiliation, Afraid, Rape, and Kick questionnaire    Fear of Current or Ex-Partner: No    Emotionally Abused: No    Physically Abused: No    Sexually Abused: No    Outpatient Medications Prior to Visit  Medication Sig Dispense Refill   acetaminophen (TYLENOL) 325 MG tablet Take 2 tablets (650 mg total) by mouth every 6 (six) hours as needed for mild pain (or Fever >/= 101). 12 tablet 0   acetic acid 0.25 % irrigation IRRIGATE WITH AS DIRECTED DAILY 1000 mL 11   amantadine (SYMMETREL) 100 MG capsule Take 1 capsule (100 mg total) by mouth 2 (two) times daily. 60 capsule 11   AMBULATORY NON FORMULARY MEDICATION 30 mLs by Intracatheter route daily as needed. Medication Name: Sodium Chloride 0.9% 900 mL 11   AMBULATORY NON FORMULARY MEDICATION Medication Name: Bedside catheter bag 2 Bag 12   baclofen (LIORESAL) 10 MG tablet Take 1 tablet (10 mg total) by mouth 3 (  three) times daily. 90 each 11   KESIMPTA 20 MG/0.4ML SOAJ Inject 20 mg into the skin every 30 (thirty) days.     meclizine (ANTIVERT) 25 MG tablet Take 1 tablet (25 mg total) by mouth 3 (three) times daily as needed for dizziness. 30 tablet 0   metoCLOPramide (REGLAN) 10 MG tablet Take 1 tablet (10 mg total) by mouth every 6 (six) hours. 12 tablet 0   Syringe, Disposable, (50-60CC SYRINGE) 60 ML MISC Flush 30-50cc of sterile water into catheter daily as needed 30 each 11   Water For Irrigation, Sterile (STERILE WATER FOR IRRIGATION) Irrigate with 50 mLs as directed daily. Irrigate catheter daily with 50cc of sterile water. 1000 mL 15   No facility-administered medications prior to visit.    No Known Allergies  ROS Review of Systems  Constitutional:  Negative for  chills and fever.  Eyes:  Negative for visual disturbance.  Respiratory:  Negative for chest tightness and shortness of breath.   Neurological:  Negative for dizziness and headaches.      Objective:    Physical Exam HENT:     Head: Normocephalic.     Mouth/Throat:     Mouth: Mucous membranes are moist.  Cardiovascular:     Rate and Rhythm: Normal rate.     Heart sounds: Normal heart sounds.  Pulmonary:     Effort: Pulmonary effort is normal.     Breath sounds: Normal breath sounds.  Musculoskeletal:     Comments: Decreased active and passive ROM bil  Neurological:     Mental Status: She is alert.     BP (!) 100/57   Pulse 87   Ht 5\' 2"  (1.575 m)   LMP 04/12/2023 (Approximate)   SpO2 99%   BMI 17.94 kg/m  Wt Readings from Last 3 Encounters:  04/21/23 98 lb 1.7 oz (44.5 kg)  01/25/23 98 lb 1.7 oz (44.5 kg)  01/17/23 98 lb (44.5 kg)    Lab Results  Component Value Date   TSH 2.430 01/12/2023   Lab Results  Component Value Date   WBC 7.0 04/21/2023   HGB 11.4 (L) 04/21/2023   HCT 36.5 04/21/2023   MCV 85.1 04/21/2023   PLT 229 04/21/2023   Lab Results  Component Value Date   NA 133 (L) 04/21/2023   K 4.2 04/21/2023   CO2 24 04/21/2023   GLUCOSE 91 04/21/2023   BUN 11 04/21/2023   CREATININE 0.68 04/21/2023   BILITOT 0.3 01/12/2023   ALKPHOS 65 01/12/2023   AST 20 01/12/2023   ALT 17 01/12/2023   PROT 7.6 01/12/2023   ALBUMIN 4.1 01/12/2023   CALCIUM 8.5 (L) 04/21/2023   ANIONGAP 8 04/21/2023   EGFR 117 01/12/2023   Lab Results  Component Value Date   CHOL 134 01/12/2023   Lab Results  Component Value Date   HDL 58 01/12/2023   Lab Results  Component Value Date   LDLCALC 68 01/12/2023   Lab Results  Component Value Date   TRIG 30 01/12/2023   Lab Results  Component Value Date   CHOLHDL 2.3 01/12/2023   Lab Results  Component Value Date   HGBA1C 5.0 01/12/2023      Assessment & Plan:  Weakness of both lower  extremities Assessment & Plan: The patient complains of worsening muscle weakness and spasms in the lower extremities. She is wheelchair-bound and reports decreased active and passive range of motion (ROM) in the lower extremities. Otherwise, she is neurovascularly intact. No recent  falls or injuries were reported.      Orders: -     Ambulatory referral to Physical Therapy  MS (multiple sclerosis) (HCC) Assessment & Plan: Encouraged to continue following up with neurology     IFG (impaired fasting glucose) -     Hemoglobin A1c  Vitamin D deficiency -     VITAMIN D 25 Hydroxy (Vit-D Deficiency, Fractures)  TSH (thyroid-stimulating hormone deficiency) -     TSH + free T4  Other hyperlipidemia -     Lipid panel -     CMP14+EGFR -     CBC with Differential/Platelet   Note: This chart has been completed using Engineer, civil (consulting) software, and while attempts have been made to ensure accuracy, certain words and phrases may not be transcribed as intended.   Follow-up: No follow-ups on file.   Gilmore Laroche, FNP

## 2023-05-03 NOTE — Patient Instructions (Addendum)
I appreciate the opportunity to provide care to you today!    Follow up:  6 months  Labs: please stop by the lab today to get your blood drawn (CBC, CMP, TSH, Lipid profile, HgA1c, Vit D)  Referrals today- PT  Attached with your AVS, you will find valuable resources for self-education. I highly recommend dedicating some time to thoroughly examine them.   Please continue to a heart-healthy diet and increase your physical activities. Try to exercise for at least five days a week.    It was a pleasure to see you and I look forward to continuing to work together on your health and well-being. Please do not hesitate to call the office if you need care or have questions about your care.  In case of emergency, please visit the Emergency Department for urgent care, or contact our clinic at [phone] to schedule an appointment. We're here to help you!   Have a wonderful day and week. With Gratitude, Gilmore Laroche MSN, FNP-BC

## 2023-05-04 ENCOUNTER — Ambulatory Visit: Payer: Medicaid Other | Admitting: Family Medicine

## 2023-05-04 NOTE — Assessment & Plan Note (Addendum)
Encouraged to continue following up with neurology

## 2023-05-04 NOTE — Assessment & Plan Note (Signed)
The patient complains of worsening muscle weakness and spasms in the lower extremities. She is wheelchair-bound and reports decreased active and passive range of motion (ROM) in the lower extremities. Otherwise, she is neurovascularly intact. No recent falls or injuries were reported.

## 2023-05-07 ENCOUNTER — Emergency Department (HOSPITAL_COMMUNITY)
Admission: EM | Admit: 2023-05-07 | Discharge: 2023-05-08 | Disposition: A | Discharge status: Home / Self Care | Payer: 59 | Attending: Emergency Medicine | Admitting: Emergency Medicine

## 2023-05-07 ENCOUNTER — Encounter (HOSPITAL_COMMUNITY): Payer: Self-pay | Admitting: Emergency Medicine

## 2023-05-07 ENCOUNTER — Other Ambulatory Visit: Payer: Self-pay

## 2023-05-07 DIAGNOSIS — R339 Retention of urine, unspecified: Secondary | ICD-10-CM | POA: Insufficient documentation

## 2023-05-07 DIAGNOSIS — T83010A Breakdown (mechanical) of cystostomy catheter, initial encounter: Secondary | ICD-10-CM | POA: Diagnosis not present

## 2023-05-07 DIAGNOSIS — T83098A Other mechanical complication of other indwelling urethral catheter, initial encounter: Secondary | ICD-10-CM | POA: Diagnosis present

## 2023-05-07 DIAGNOSIS — R0902 Hypoxemia: Secondary | ICD-10-CM | POA: Diagnosis not present

## 2023-05-07 DIAGNOSIS — Y738 Miscellaneous gastroenterology and urology devices associated with adverse incidents, not elsewhere classified: Secondary | ICD-10-CM | POA: Diagnosis not present

## 2023-05-07 DIAGNOSIS — R5381 Other malaise: Secondary | ICD-10-CM | POA: Diagnosis not present

## 2023-05-07 LAB — CBC WITH DIFFERENTIAL/PLATELET
Abs Immature Granulocytes: 0.02 10*3/uL (ref 0.00–0.07)
Basophils Absolute: 0 10*3/uL (ref 0.0–0.1)
Basophils Relative: 0 %
Eosinophils Absolute: 0.1 10*3/uL (ref 0.0–0.5)
Eosinophils Relative: 1 %
HCT: 35.7 % — ABNORMAL LOW (ref 36.0–46.0)
Hemoglobin: 11.4 g/dL — ABNORMAL LOW (ref 12.0–15.0)
Immature Granulocytes: 0 %
Lymphocytes Relative: 23 %
Lymphs Abs: 1.9 10*3/uL (ref 0.7–4.0)
MCH: 26.5 pg (ref 26.0–34.0)
MCHC: 31.9 g/dL (ref 30.0–36.0)
MCV: 83 fL (ref 80.0–100.0)
Monocytes Absolute: 0.4 10*3/uL (ref 0.1–1.0)
Monocytes Relative: 5 %
Neutro Abs: 5.7 10*3/uL (ref 1.7–7.7)
Neutrophils Relative %: 71 %
Platelets: 272 10*3/uL (ref 150–400)
RBC: 4.3 MIL/uL (ref 3.87–5.11)
RDW: 16.7 % — ABNORMAL HIGH (ref 11.5–15.5)
WBC: 8.1 10*3/uL (ref 4.0–10.5)
nRBC: 0 % (ref 0.0–0.2)

## 2023-05-07 LAB — URINALYSIS, W/ REFLEX TO CULTURE (INFECTION SUSPECTED)
Bacteria, UA: NONE SEEN
Bilirubin Urine: NEGATIVE
Glucose, UA: NEGATIVE mg/dL
Ketones, ur: NEGATIVE mg/dL
Nitrite: NEGATIVE
Protein, ur: 100 mg/dL — AB
RBC / HPF: >50 RBC/hpf (ref 0–5)
Specific Gravity, Urine: 1.016 (ref 1.005–1.030)
WBC, UA: >50 WBC/hpf (ref 0–5)
pH: 6 (ref 5.0–8.0)

## 2023-05-07 LAB — BASIC METABOLIC PANEL
Anion gap: 10 (ref 5–15)
BUN: 12 mg/dL (ref 6–20)
CO2: 23 mmol/L (ref 22–32)
Calcium: 8.9 mg/dL (ref 8.9–10.3)
Chloride: 106 mmol/L (ref 98–111)
Creatinine, Ser: 0.69 mg/dL (ref 0.44–1.00)
GFR, Estimated: >60 mL/min (ref >60)
Glucose, Bld: 94 mg/dL (ref 70–99)
Potassium: 3.7 mmol/L (ref 3.5–5.1)
Sodium: 139 mmol/L (ref 135–145)

## 2023-05-07 MED ORDER — SODIUM CHLORIDE 0.9 % IV BOLUS
500.0000 mL | Freq: Once | INTRAVENOUS | Status: AC
  Administered 2023-05-07: 500 mL via INTRAVENOUS

## 2023-05-07 NOTE — Discharge Instructions (Addendum)
It was a pleasure caring for you today in the emergency department.  Please return to the emergency department for any worsening or worrisome symptoms.

## 2023-05-07 NOTE — ED Triage Notes (Signed)
Pt arrived via [facility] c/o clogged suprapubic catheter x 2 days. She reports no urinary output x 2 days. Kellogg RN

## 2023-05-07 NOTE — ED Provider Notes (Signed)
  Provider Note MRN:  [id number]  Arrival date & time: 05/08/23    ED Course and Medical Decision Making  Assumed care from [facility] at shift change.  See note from prior team for complete details, in brief:  33 Yo female Suprapubic cath clogged Replaced in ED Pending ua  Plan per prior physician f/u ua  Ua w/o overt UTI, sterile pyuria noted  She is feeling better, foley is voiding well. Stable for dc  The patient improved significantly and was discharged in stable condition. Detailed discussions were had with the patient/guardian regarding current findings, and need for close f/u with PCP or on call doctor. The patient/guardian has been instructed to return immediately if the symptoms worsen in any way for re-evaluation. Patient/guardian verbalized understanding and is in agreement with current care plan. All questions answered prior to discharge.    Procedures  Final Clinical Impressions(s) / ED Diagnoses     ICD-10-CM   1. Suprapubic catheter dysfunction, initial encounter [facility])  T83.010A       ED Discharge Orders     None         Discharge Instructions      It was a pleasure caring for you today in the emergency department.  Please return to the emergency department for any worsening or worrisome symptoms.          Sloan Leiter, DO 05/08/23 [phone]

## 2023-05-07 NOTE — ED Notes (Signed)
EDP at bedside. Kellogg RN

## 2023-05-07 NOTE — ED Provider Notes (Signed)
[place] EMERGENCY DEPARTMENT AT [facility] Provider Note   [name]: [id number] Arrival date & time: 05/07/23  1945     History  Chief Complaint  Patient presents with   clogged supra pubic catheter    Melissa West is a 33 y.o. female.  HPI 33 year old female presents with concern for a clogged Foley catheter.  This has happened to her before.  She has had no urine output over the past 2 days.  She denies any new or worsening back or flank pain, abdominal pain, fever.  Home Medications Prior to Admission medications   Medication Sig Start Date End Date Taking? Authorizing Provider  acetaminophen (TYLENOL) 325 MG tablet Take 2 tablets (650 mg total) by mouth every 6 (six) hours as needed for mild pain (or Fever >/= 101). 05/20/21   Shon Hale, MD  acetic acid 0.25 % irrigation IRRIGATE WITH AS DIRECTED DAILY 03/22/23   [name], Mardene Celeste, MD  amantadine (SYMMETREL) 100 MG capsule Take 1 capsule (100 mg total) by mouth 2 (two) times daily. 01/26/23   Sater, Pearletha Furl, MD  AMBULATORY NON FORMULARY MEDICATION 30 mLs by Intracatheter route daily as needed. Medication Name: Sodium Chloride 0.9% 11/12/21   [name], Mardene Celeste, MD  AMBULATORY NON FORMULARY MEDICATION Medication Name: Bedside catheter bag 08/12/22   Bjorn Pippin, MD  baclofen (LIORESAL) 10 MG tablet Take 1 tablet (10 mg total) by mouth 3 (three) times daily. 10/18/22   Sater, Pearletha Furl, MD  KESIMPTA 20 MG/0.4ML SOAJ Inject 20 mg into the skin every 30 (thirty) days. 06/24/21   [provider]  meclizine (ANTIVERT) 25 MG tablet Take 1 tablet (25 mg total) by mouth 3 (three) times daily as needed for dizziness. 12/29/22   Bethann Berkshire, MD  metoCLOPramide (REGLAN) 10 MG tablet Take 1 tablet (10 mg total) by mouth every 6 (six) hours. 04/21/23   Rondel Baton, MD  Syringe, Disposable, (50-60CC SYRINGE) 60 ML MISC Flush 30-50cc of sterile water into catheter daily as needed 01/15/22   [name], Mardene Celeste,  MD  Water For Irrigation, Sterile (STERILE WATER FOR IRRIGATION) Irrigate with 50 mLs as directed daily. Irrigate catheter daily with 50cc of sterile water. 01/15/22   [name], Mardene Celeste, MD      Allergies    Patient has no known allergies.    Review of Systems   Review of Systems  Constitutional:  Negative for fever.  Gastrointestinal:  Negative for abdominal pain.  Genitourinary:  Positive for decreased urine volume.  Musculoskeletal:  Negative for back pain.    Physical Exam Updated Vital Signs BP 109/73 (BP Location: Left Arm)   Pulse 72   Temp 97.6 F (36.4 C) (Oral)   Resp 16   Ht 5\' 2"  (1.575 m)   LMP 04/12/2023 (Approximate)   SpO2 99%   BMI 17.94 kg/m  Physical Exam Vitals and nursing note reviewed.  Constitutional:      Appearance: She is well-developed.  HENT:     Head: Normocephalic and atraumatic.  Pulmonary:     Effort: Pulmonary effort is normal.  Abdominal:     General: There is no distension.     Palpations: Abdomen is soft.     Tenderness: There is no abdominal tenderness.     Comments: Suprapubic catheter in place. No surrounding cellulitis/drainage.  Skin:    General: Skin is warm and dry.  Neurological:     Mental Status: She is alert.     Comments: contractures  ED Results / Procedures / Treatments   Labs (all labs ordered are listed, but only abnormal results are displayed) Labs Reviewed  BASIC METABOLIC PANEL  CBC WITH DIFFERENTIAL/PLATELET    EKG None  Radiology No results found.  Procedures SUPRAPUBIC TUBE PLACEMENT  Date/Time: 05/07/2023 9:28 PM  Performed by: Pricilla Loveless, MD Authorized by: Pricilla Loveless, MD   Consent:    Consent obtained:  Verbal   Consent given by:  Patient Universal protocol:    Patient identity confirmed:  Verbally with patient Sedation:    Sedation type:  None Anesthesia:    Anesthesia method:  None Procedure details:    Complexity:  Simple   Catheter type:  Foley   Catheter size:   22 Fr   Number of attempts:  1   Urine characteristics:  Mildly cloudy Post-procedure details:    Procedure completion:  Tolerated well, no immediate complications     Medications Ordered in ED Medications - No data to display  ED Course/ Medical Decision Making/ A&P                                Medical Decision Making Amount and/or Complexity of Data Reviewed Labs: ordered.   CBC/BMP are overall unremarkable. She was given some IV fluids as she didn't seem to have much in her bladder. Catheter was changed without difficulty. Waiting on UA, care transferred to Dr. Wallace Cullens.        Final Clinical Impression(s) / ED Diagnoses Final diagnoses:  None    Rx / DC Orders ED Discharge Orders     None         Pricilla Loveless, MD 05/07/23 2338

## 2023-05-09 ENCOUNTER — Telehealth: Payer: Self-pay

## 2023-05-09 NOTE — Telephone Encounter (Signed)
Pt called to let us know she had her cath changed at the ED and wanted to reschedule appt LVM that appt was rescheduled and could view on mychart but if not to call back

## 2023-05-10 LAB — URINE CULTURE

## 2023-05-11 ENCOUNTER — Ambulatory Visit: Payer: Self-pay | Admitting: Family Medicine

## 2023-05-11 NOTE — Telephone Encounter (Signed)
Copied from CRM [id number]. Topic: Clinical - Medical Advice >> May 11, 2023 11:30 AM Maxwell Marion wrote: Reason for CRM: Patient is wanting a cream or something called in for a butt rash she is having to be sent to pharmacy [facility]. She's not wanting to come in and be seen for an appointment.   Chief Complaint: rash/skin breakdown near anus Symptoms: "a little pain" sometimes like "when wipe it,"  Frequency: continual Pertinent Negatives: Patient denies being able to assess rash appearance, fever, broken skin, blisters, pus from area Disposition: [] 911 / [] ED /[] Urgent Care (no appt availability in office) / [] Appointment(In office/virtual)/ []  [place] [facility]/ [] Home Care/ [x] Refused Recommended Disposition /[] [facility] [facility]/ []  Follow-up with PCP Additional Notes: Pt reporting that she can tell she has a rash to her anal area from how "it feels," pt unable to assess rash appearance, but can feel that there are no blisters, skin is closed, no pus that she can tell. Pt reporting that she "still got a little bit of cream" left that doc "gave me cream if anything like that happens again," but "almost out of it." Pt confirms that she has aides that have helped her put on the cream today. Advised pt be examined in next 2 weeks per protocol, pt stating "I just seen y'all," but confirms did not have rash at time of last appt, pt not wanting to schedule appt. Pt also requesting an inquiry into home health nurse for her, especially for her catheter care so she doesn't "have to go to the hospital" every month like she does now. Advised that nurse sending HP message to PCP office to inquire about the cream and home health care, advised call back if rash worsening or not improving. Nurse attempting to find name of cream in pt chart, pt does not remember name of med, prescriber, or when prescribed. Phone call disconnected. Nurse called pt right back, no answer, LVM for call back to office to  give name of med after look at med she has left.  Reason for Disposition  Red, moist, irritated area between skin folds (or under larger breasts)  Answer Assessment - Initial Assessment Questions 1. APPEARANCE of RASH: "Describe the rash."      Skin is closed, no blisters,  2. LOCATION: "Where is the rash located?"      Around anus, not sure of extent, feel it when wipe it 5. ONSET: "When did the rash start?"      Started right after doc, something like in the past few days 6. ITCHING: "Does the rash itch?" If Yes, ask: "How bad is the itch?"  (Scale 0-10; or none, mild, moderate, severe)     No itching 7. PAIN: "Does the rash hurt?" If Yes, ask: "How bad is the pain?"  (Scale 0-10; or none, mild, moderate, severe)    - NONE (0): no pain    - MILD (1-3): doesn't interfere with normal activities     - MODERATE (4-7): interferes with normal activities or awakens from sleep     - SEVERE (8-10): excruciating pain, unable to do any normal activities     Little pain 8. OTHER SYMPTOMS: "Do you have any other symptoms?" (e.g., fever)     denies  Protocols used: Rash or Redness - Localized-A-AH

## 2023-05-11 NOTE — Telephone Encounter (Signed)
Was seen in office 05/03/23. Asking for cream to be sent in for rash on bottom. Pls advise

## 2023-05-12 ENCOUNTER — Ambulatory Visit: Payer: 59

## 2023-05-17 ENCOUNTER — Ambulatory Visit: Payer: 59

## 2023-05-18 ENCOUNTER — Encounter (HOSPITAL_COMMUNITY): Payer: Self-pay

## 2023-05-18 ENCOUNTER — Emergency Department (HOSPITAL_COMMUNITY): Payer: 59

## 2023-05-18 ENCOUNTER — Emergency Department (HOSPITAL_COMMUNITY)
Admission: EM | Admit: 2023-05-18 | Discharge: 2023-05-19 | Disposition: A | Discharge status: Home / Self Care | Payer: 59 | Attending: Emergency Medicine | Admitting: Emergency Medicine

## 2023-05-18 ENCOUNTER — Other Ambulatory Visit: Payer: Self-pay

## 2023-05-18 DIAGNOSIS — Z20822 Contact with and (suspected) exposure to covid-19: Secondary | ICD-10-CM | POA: Insufficient documentation

## 2023-05-18 DIAGNOSIS — E871 Hypo-osmolality and hyponatremia: Secondary | ICD-10-CM | POA: Insufficient documentation

## 2023-05-18 DIAGNOSIS — R319 Hematuria, unspecified: Secondary | ICD-10-CM | POA: Diagnosis not present

## 2023-05-18 DIAGNOSIS — G35 Multiple sclerosis: Secondary | ICD-10-CM | POA: Insufficient documentation

## 2023-05-18 DIAGNOSIS — R0789 Other chest pain: Secondary | ICD-10-CM | POA: Insufficient documentation

## 2023-05-18 DIAGNOSIS — D649 Anemia, unspecified: Secondary | ICD-10-CM | POA: Insufficient documentation

## 2023-05-18 DIAGNOSIS — Z743 Need for continuous supervision: Secondary | ICD-10-CM | POA: Diagnosis not present

## 2023-05-18 DIAGNOSIS — R079 Chest pain, unspecified: Secondary | ICD-10-CM | POA: Diagnosis not present

## 2023-05-18 DIAGNOSIS — R000 Tachycardia, unspecified: Secondary | ICD-10-CM | POA: Insufficient documentation

## 2023-05-18 DIAGNOSIS — N390 Urinary tract infection, site not specified: Secondary | ICD-10-CM | POA: Diagnosis not present

## 2023-05-18 DIAGNOSIS — R6889 Other general symptoms and signs: Secondary | ICD-10-CM | POA: Diagnosis not present

## 2023-05-18 DIAGNOSIS — I499 Cardiac arrhythmia, unspecified: Secondary | ICD-10-CM | POA: Diagnosis not present

## 2023-05-18 LAB — TROPONIN I (HIGH SENSITIVITY)
Troponin I (High Sensitivity): 2 ng/L (ref <18)
Troponin I (High Sensitivity): 2 ng/L (ref <18)

## 2023-05-18 LAB — CBC
HCT: 31.5 % — ABNORMAL LOW (ref 36.0–46.0)
Hemoglobin: 10.1 g/dL — ABNORMAL LOW (ref 12.0–15.0)
MCH: 25.9 pg — ABNORMAL LOW (ref 26.0–34.0)
MCHC: 32.1 g/dL (ref 30.0–36.0)
MCV: 80.8 fL (ref 80.0–100.0)
Platelets: 304 10*3/uL (ref 150–400)
RBC: 3.9 MIL/uL (ref 3.87–5.11)
RDW: 16.7 % — ABNORMAL HIGH (ref 11.5–15.5)
WBC: 10.3 10*3/uL (ref 4.0–10.5)
nRBC: 0 % (ref 0.0–0.2)

## 2023-05-18 LAB — BASIC METABOLIC PANEL
Anion gap: 10 (ref 5–15)
BUN: 9 mg/dL (ref 6–20)
CO2: 25 mmol/L (ref 22–32)
Calcium: 8.5 mg/dL — ABNORMAL LOW (ref 8.9–10.3)
Chloride: 97 mmol/L — ABNORMAL LOW (ref 98–111)
Creatinine, Ser: 0.78 mg/dL (ref 0.44–1.00)
GFR, Estimated: >60 mL/min (ref >60)
Glucose, Bld: 113 mg/dL — ABNORMAL HIGH (ref 70–99)
Potassium: 3.8 mmol/L (ref 3.5–5.1)
Sodium: 132 mmol/L — ABNORMAL LOW (ref 135–145)

## 2023-05-18 LAB — URINALYSIS, ROUTINE W REFLEX MICROSCOPIC
Bilirubin Urine: NEGATIVE
Glucose, UA: NEGATIVE mg/dL
Ketones, ur: NEGATIVE mg/dL
Nitrite: NEGATIVE
Protein, ur: 100 mg/dL — AB
Specific Gravity, Urine: 1.02 (ref 1.005–1.030)
WBC, UA: >50 WBC/hpf (ref 0–5)
pH: 8 (ref 5.0–8.0)

## 2023-05-18 LAB — RESP PANEL BY RT-PCR (RSV, FLU A&B, COVID)  RVPGX2
Influenza A by PCR: NEGATIVE
Influenza B by PCR: NEGATIVE
Resp Syncytial Virus by PCR: NEGATIVE
SARS Coronavirus 2 by RT PCR: NEGATIVE

## 2023-05-18 MED ORDER — CIPROFLOXACIN HCL 500 MG PO TABS: 500.0000 mg | ORAL_TABLET | Freq: Two times a day (BID) | ORAL | 0 refills | Status: DC

## 2023-05-18 MED ORDER — SODIUM CHLORIDE 0.9 % IV BOLUS
1000.0000 mL | Freq: Once | INTRAVENOUS | Status: AC
  Administered 2023-05-18: 1000 mL via INTRAVENOUS

## 2023-05-18 MED ORDER — CIPROFLOXACIN HCL 250 MG PO TABS
500.0000 mg | ORAL_TABLET | Freq: Once | ORAL | Status: AC
  Administered 2023-05-19: 500 mg via ORAL
  Filled 2023-05-18: qty 2

## 2023-05-18 NOTE — ED Notes (Signed)
 Patient presented with suprapubic catheter in place.  Had leg bag on arrival.  Standard drainage bag placed.  Patient hygiene preformed.

## 2023-05-18 NOTE — Discharge Instructions (Addendum)
 You were seen for atypical chest pain.  You are also found to have a urinary tract infection.  Please pick up your antibiotic and take as prescribed.  Thank you for letting us  treat you today. After reviewing your labs and imaging, I feel you are safe to go home. Please follow up with your PCP in the next several days and provide them with your records from this visit. Return to the Emergency Room if pain becomes severe or symptoms worsen.

## 2023-05-18 NOTE — ED Triage Notes (Signed)
 Pt BIB ems for intermittent chest pain all day. Pt has dark urine questionable UTI. Pt denies pain other than chest. Per EMS vs [facility].

## 2023-05-18 NOTE — ED Provider Notes (Signed)
 [place] EMERGENCY DEPARTMENT AT [facility] Provider Note   [name]: [id number] Arrival date & time: 05/18/23  1829     History  Chief Complaint  Patient presents with   Chest Pain    Melissa West is a 33 y.o. female past medical history significant for MS, sepsis, and chronic anemia presents today for intermittent chest pain.  Patient states that the chest pain has been all day.  Patient has also had dark urine.  Patient also endorses change in appetite.  Patient denies nausea, vomiting, fever, cough, new weakness, congestion, abdominal pain, dysuria, or shortness of breath.   Chest Pain      Home Medications Prior to Admission medications   Medication Sig Start Date End Date Taking? Authorizing Provider  ciprofloxacin  (CIPRO ) 500 MG tablet Take 1 tablet (500 mg total) by mouth every 12 (twelve) hours. 05/18/23  Yes Ayde Record N, PA-C  acetaminophen  (TYLENOL ) 325 MG tablet Take 2 tablets (650 mg total) by mouth every 6 (six) hours as needed for mild pain (or Fever >/= 101). 05/20/21   Pearlean Manus, MD  acetic acid  0.25 % irrigation IRRIGATE WITH AS DIRECTED DAILY 03/22/23   [name], Belvie CROME, MD  amantadine  (SYMMETREL ) 100 MG capsule Take 1 capsule (100 mg total) by mouth 2 (two) times daily. 01/26/23   Sater, Charlie LABOR, MD  AMBULATORY NON FORMULARY MEDICATION 30 mLs by Intracatheter route daily as needed. Medication Name: Sodium Chloride  0.9% 11/12/21   [name], Belvie CROME, MD  AMBULATORY NON FORMULARY MEDICATION Medication Name: Bedside catheter bag 08/12/22   Watt Rush, MD  baclofen  (LIORESAL ) 10 MG tablet Take 1 tablet (10 mg total) by mouth 3 (three) times daily. 10/18/22   Sater, Charlie LABOR, MD  KESIMPTA  20 MG/0.4ML SOAJ Inject 20 mg into the skin every 30 (thirty) days. 06/24/21   [provider]  meclizine  (ANTIVERT ) 25 MG tablet Take 1 tablet (25 mg total) by mouth 3 (three) times daily as needed for dizziness. 12/29/22   Suzette Pac, MD   metoCLOPramide  (REGLAN ) 10 MG tablet Take 1 tablet (10 mg total) by mouth every 6 (six) hours. 04/21/23   Yolande Lamar BROCKS, MD  Syringe, Disposable, (50-60CC SYRINGE) 60 ML MISC Flush 30-50cc of sterile water  into catheter daily as needed 01/15/22   Sherrilee Belvie CROME, MD  Water  For Irrigation, Sterile (STERILE WATER  FOR IRRIGATION) Irrigate with 50 mLs as directed daily. Irrigate catheter daily with 50cc of sterile water . 01/15/22   [name], Belvie CROME, MD      Allergies    Patient has no known allergies.    Review of Systems   Review of Systems  Cardiovascular:  Positive for chest pain.    Physical Exam Updated Vital Signs BP 103/72   Pulse (!) 106   Temp 98.5 F (36.9 C) (Oral)   Resp 18   Ht 5' 2 (1.575 m)   Wt 59.1 kg   LMP 04/19/2023 (Approximate)   SpO2 100%   BMI 23.83 kg/m  Physical Exam Vitals and nursing note reviewed.  Constitutional:      General: She is not in acute distress.    Appearance: She is well-developed.  HENT:     Head: Normocephalic and atraumatic.  Eyes:     Conjunctiva/sclera: Conjunctivae normal.  Cardiovascular:     Rate and Rhythm: Normal rate and regular rhythm.     Heart sounds: Normal heart sounds. No murmur heard. Pulmonary:     Effort: Pulmonary effort is normal. No respiratory  distress.     Breath sounds: Normal breath sounds.  Abdominal:     Palpations: Abdomen is soft.     Tenderness: There is no abdominal tenderness.  Musculoskeletal:        General: No swelling.     Cervical back: Neck supple.     Right lower leg: No edema.     Left lower leg: No edema.  Skin:    General: Skin is warm and dry.     Capillary Refill: Capillary refill takes less than 2 seconds.  Neurological:     Mental Status: She is alert. Mental status is at baseline.     GCS: GCS eye subscore is 4. GCS verbal subscore is 5. GCS motor subscore is 6.  Psychiatric:        Mood and Affect: Mood normal.     ED Results / Procedures / Treatments    Labs (all labs ordered are listed, but only abnormal results are displayed) Labs Reviewed  BASIC METABOLIC PANEL - Abnormal; Notable for the following components:      Result Value   Sodium 132 (*)    Chloride 97 (*)    Glucose, Bld 113 (*)    Calcium  8.5 (*)    All other components within normal limits  CBC - Abnormal; Notable for the following components:   Hemoglobin 10.1 (*)    HCT 31.5 (*)    MCH 25.9 (*)    RDW 16.7 (*)    All other components within normal limits  URINALYSIS, ROUTINE W REFLEX MICROSCOPIC - Abnormal; Notable for the following components:   APPearance TURBID (*)    Hgb urine dipstick SMALL (*)    Protein, ur 100 (*)    Leukocytes,Ua SMALL (*)    Bacteria, UA FEW (*)    All other components within normal limits  RESP PANEL BY RT-PCR (RSV, FLU A&B, COVID)  RVPGX2  TROPONIN I (HIGH SENSITIVITY)  TROPONIN I (HIGH SENSITIVITY)    EKG None  Radiology DG Chest Port 1 View Result Date: 05/18/2023 CLINICAL DATA:  Chest pain EXAM: PORTABLE CHEST 1 VIEW COMPARISON:  10/08/2022 FINDINGS: The heart size and mediastinal contours are within normal limits. Both lungs are clear. The visualized skeletal structures are unremarkable. IMPRESSION: No active disease. Electronically Signed   By: Luke Bun M.D.   On: 05/18/2023 19:59    Procedures Procedures    Medications Ordered in ED Medications  sodium chloride  0.9 % bolus 1,000 mL (has no administration in time range)  ciprofloxacin  (CIPRO ) tablet 500 mg (has no administration in time range)    ED Course/ Medical Decision Making/ A&P                                 Medical Decision Making Amount and/or Complexity of Data Reviewed Labs: ordered. Radiology: ordered.   This patient presents to the ED with chief complaint(s) of chest pain with pertinent past medical history of MS, chronic anemia, sepsis which further complicates the presenting complaint. The complaint involves an extensive differential  diagnosis and also carries with it a high risk of complications and morbidity.    The differential diagnosis includes ACS, GERD, pneumothorax, PE, UTI, sepsis, arrhythmia  Additional history obtained: Additional history obtained from family Records reviewed Primary Care Documents  ED Course and Reassessment: Patient given NS bolus for tachycardia and soft blood pressures  Independent labs interpretation:  The following labs were independently  interpreted:  CBC: Anemia at 10.1 which is chronic per historical values BMP: Mild hyponatremia at 132, mildly decreased chloride at 97, mildly decreased calcium  at 8.5 UA: Turbid, small hemoglobin, small leukocytes, 100 protein, few bacteria, greater than 50 WBCs Troponin: 2, 2 Respiratory panel: Negative EKG: Sinus tachycardia  Independent visualization of imaging: - I independently visualized the following imaging with scope of interpretation limited to determining acute life threatening conditions related to emergency care: X-ray, which revealed No active disease  Consultation: - Consulted or discussed management/test interpretation w/ external professional: None  Consideration for admission or further workup: Considered for admission further workup however patient's vital signs, physical exam, labs, and imaging were reassuring.  Patient symptoms likely due to atypical chest pain.  Patient is to use Tylenol /Motrin  as needed for pain.  Patient should follow-up with primary care if symptoms persist for further evaluation and treatment.  Patient was found to have UTI while in ED.  Patient will be treated with ciprofloxacin  as that is what her most recent urine culture was susceptible to.        Final Clinical Impression(s) / ED Diagnoses Final diagnoses:  Atypical chest pain  Urinary tract infection with hematuria, site unspecified    Rx / DC Orders ED Discharge Orders          Ordered    ciprofloxacin  (CIPRO ) 500 MG tablet  Every 12  hours        05/18/23 2312              Francis Ileana SAILOR, PA-C 05/18/23 2314    Suzette Pac, MD 05/23/23 1038

## 2023-05-18 NOTE — ED Notes (Signed)
 Cleaned pt up due to BM and RN changed leg bag to a new bag. Waiting for clean urine in new bag to get a urine sample.

## 2023-05-19 DIAGNOSIS — R0789 Other chest pain: Secondary | ICD-10-CM | POA: Diagnosis not present

## 2023-05-19 DIAGNOSIS — R279 Unspecified lack of coordination: Secondary | ICD-10-CM | POA: Diagnosis not present

## 2023-05-19 DIAGNOSIS — Z7401 Bed confinement status: Secondary | ICD-10-CM | POA: Diagnosis not present

## 2023-05-19 NOTE — ED Notes (Signed)
 Pt has been placed on the convo list

## 2023-05-21 LAB — URINE CULTURE: Culture: >=100000 — AB

## 2023-05-22 ENCOUNTER — Telehealth (HOSPITAL_BASED_OUTPATIENT_CLINIC_OR_DEPARTMENT_OTHER): Payer: Self-pay | Admitting: *Deleted

## 2023-05-22 NOTE — Telephone Encounter (Signed)
 Post ED Visit - Positive Culture Follow-up  Culture report reviewed by antimicrobial stewardship pharmacist: [facility] []  Rankin Dee, Pharm.D. []  Venetia Gully, Pharm.D., BCPS AQ-ID []  Garrel Crews, Pharm.D., BCPS []  Almarie Lunger, [address].D., BCPS []  [place], [place].D., BCPS, AAHIVP []  Rosaline Bihari, Pharm.D., BCPS, AAHIVP []  Vernell Meier, PharmD, BCPS []  Latanya Hint, PharmD, BCPS []  Donald Medley, PharmD, BCPS []  Rocky Bold, PharmD []  Dorothyann Alert, PharmD, BCPS [x]  Dorn Poot, PharmD  [facility] []  Rosaline Edison, PharmD []  Romona Bliss, PharmD []  Dolphus Roller, PharmD []  Veva Seip, Rph []  Vernell Daunt) Leonce, PharmD []  Eva Allis, PharmD []  Rosaline Millet, PharmD []  Iantha Batch, PharmD []  Arvin Gauss, PharmD []  Wanda Hasting, PharmD []  Ronal Rav, PharmD []  Rocky Slade, PharmD []  Bard Jeans, PharmD   Positive urine culture Treated with Ciprofloxacin , organism sensitive to the same and no further patient follow-up is required at this time.  Albino Alan Novak 05/22/2023, 12:07 PM

## 2023-05-23 ENCOUNTER — Ambulatory Visit: Payer: 59

## 2023-05-26 ENCOUNTER — Other Ambulatory Visit: Payer: Self-pay | Admitting: Family Medicine

## 2023-05-26 ENCOUNTER — Telehealth: Payer: Self-pay | Admitting: Family Medicine

## 2023-05-26 ENCOUNTER — Ambulatory Visit: Payer: 59

## 2023-05-26 DIAGNOSIS — E038 Other specified hypothyroidism: Secondary | ICD-10-CM | POA: Diagnosis not present

## 2023-05-26 DIAGNOSIS — R7301 Impaired fasting glucose: Secondary | ICD-10-CM | POA: Diagnosis not present

## 2023-05-26 DIAGNOSIS — R21 Rash and other nonspecific skin eruption: Secondary | ICD-10-CM

## 2023-05-26 DIAGNOSIS — E7849 Other hyperlipidemia: Secondary | ICD-10-CM | POA: Diagnosis not present

## 2023-05-26 DIAGNOSIS — E559 Vitamin D deficiency, unspecified: Secondary | ICD-10-CM | POA: Diagnosis not present

## 2023-05-26 MED ORDER — PERIGUARD EX OINT: TOPICAL_OINTMENT | CUTANEOUS | 1 refills | Status: DC

## 2023-05-26 NOTE — Telephone Encounter (Signed)
Is requesting periguard ointment be sent to her pharmacy

## 2023-05-26 NOTE — Telephone Encounter (Signed)
Rx sentt.

## 2023-05-26 NOTE — Progress Notes (Signed)
Pt. Came in for wt. Check. Pt. Is wheelchair bound. MA along with Supervisor lifted pt. Onto scale weighed both together and subtracted weight of MA . Both reseated and re belted pt to wheelchair. Pt. Weight noted in  chart.

## 2023-05-27 ENCOUNTER — Ambulatory Visit: Payer: Self-pay | Admitting: Family Medicine

## 2023-05-27 LAB — CBC WITH DIFFERENTIAL/PLATELET
Basophils Absolute: 0 10*3/uL (ref 0.0–0.2)
Basos: 0 %
EOS (ABSOLUTE): 0.2 10*3/uL (ref 0.0–0.4)
Eos: 2 %
Hematocrit: 32 % — ABNORMAL LOW (ref 34.0–46.6)
Hemoglobin: 10.3 g/dL — ABNORMAL LOW (ref 11.1–15.9)
Immature Grans (Abs): 0 10*3/uL (ref 0.0–0.1)
Immature Granulocytes: 1 %
Lymphocytes Absolute: 1.7 10*3/uL (ref 0.7–3.1)
Lymphs: 24 %
MCH: 25.6 pg — ABNORMAL LOW (ref 26.6–33.0)
MCHC: 32.2 g/dL (ref 31.5–35.7)
MCV: 79 fL (ref 79–97)
Monocytes Absolute: 0.3 10*3/uL (ref 0.1–0.9)
Monocytes: 4 %
Neutrophils Absolute: 4.9 10*3/uL (ref 1.4–7.0)
Neutrophils: 69 %
Platelets: 490 10*3/uL — ABNORMAL HIGH (ref 150–450)
RBC: 4.03 x10E6/uL (ref 3.77–5.28)
RDW: 16 % — ABNORMAL HIGH (ref 11.7–15.4)
WBC: 7.2 10*3/uL (ref 3.4–10.8)

## 2023-05-27 LAB — CMP14+EGFR
ALT: 10 [IU]/L (ref 0–32)
AST: 10 [IU]/L (ref 0–40)
Albumin: 3.3 g/dL — ABNORMAL LOW (ref 3.9–4.9)
Alkaline Phosphatase: 84 [IU]/L (ref 44–121)
BUN/Creatinine Ratio: 12 (ref 9–23)
BUN: 11 mg/dL (ref 6–20)
Bilirubin Total: 0.3 mg/dL (ref 0.0–1.2)
CO2: 22 mmol/L (ref 20–29)
Calcium: 8.9 mg/dL (ref 8.7–10.2)
Chloride: 103 mmol/L (ref 96–106)
Creatinine, Ser: 0.91 mg/dL (ref 0.57–1.00)
Globulin, Total: 4.1 g/dL (ref 1.5–4.5)
Glucose: 82 mg/dL (ref 70–99)
Potassium: 4.2 mmol/L (ref 3.5–5.2)
Sodium: 139 mmol/L (ref 134–144)
Total Protein: 7.4 g/dL (ref 6.0–8.5)
eGFR: 85 mL/min/{1.73_m2} (ref >59)

## 2023-05-27 LAB — HEMOGLOBIN A1C
Est. average glucose Bld gHb Est-mCnc: 105 mg/dL
Hgb A1c MFr Bld: 5.3 % (ref 4.8–5.6)

## 2023-05-27 LAB — LIPID PANEL
Chol/HDL Ratio: 2.6 {ratio} (ref 0.0–4.4)
Cholesterol, Total: 100 mg/dL (ref 100–199)
HDL: 38 mg/dL — ABNORMAL LOW (ref >39)
LDL Chol Calc (NIH): 51 mg/dL (ref 0–99)
Triglycerides: 41 mg/dL (ref 0–149)
VLDL Cholesterol Cal: 11 mg/dL (ref 5–40)

## 2023-05-27 LAB — VITAMIN D 25 HYDROXY (VIT D DEFICIENCY, FRACTURES): Vit D, 25-Hydroxy: 31.3 ng/mL (ref 30.0–100.0)

## 2023-05-27 LAB — TSH+FREE T4
Free T4: 1.52 ng/dL (ref 0.82–1.77)
TSH: 1.68 u[IU]/mL (ref 0.450–4.500)

## 2023-05-27 NOTE — Telephone Encounter (Signed)
  Chief Complaint: facial rash to her right cheek Symptoms: small red spots, intermittent itching Frequency: started a week ago Pertinent Negatives: Patient denies fever Disposition: [] ED /[] Urgent Care (no appt availability in office) / [] Appointment(In office/virtual)/ []  [facility]/ [] Home Care/ [x] Refused Recommended Disposition /[] [place]/ []  Follow-up with PCP Additional Notes: patient called with week long right sided facial rash. Patient states rash is localized to her right cheek. Patient endorses itching intermittently. Patient denies any pain. Patient is inquiring about a cream for the rash. Per protocol, recommendation is to be seen in 3 days. Appointment available with another provider in office on Monday. Patient refused to see another provider, wanting to only see her provider. Patient asking for provider to send prescription to her pharmacy. Patient verbalized understanding and all questions answered.    Reason for Disposition  Localized rash present > 7 days  Answer Assessment - Initial Assessment Questions 1. APPEARANCE of RASH: "Describe the rash."      Small areas of redness 2. LOCATION: "Where is the rash located?"      Located on right side of face on her cheek 3. NUMBER: "How many spots are there?"      Unable to count how  4. SIZE: "How big are the spots?" (Inches, centimeters or compare to size of a coin)      samm 5. ONSET: "When did the rash start?"      Patient states started about week ago 6. ITCHING: "Does the rash itch?" If Yes, ask: "How bad is the itch?"  (Scale 0-10; or none, mild, moderate, severe)     Itch at times 7. PAIN: "Does the rash hurt?" If Yes, ask: "How bad is the pain?"  (Scale 0-10; or none, mild, moderate, severe)    - NONE (0): no pain    - MILD (1-3): doesn't interfere with normal activities     - MODERATE (4-7): interferes with normal activities or awakens from sleep     - SEVERE (8-10): excruciating pain,  unable to do any normal activities     No pain 8. OTHER SYMPTOMS: "Do you have any other symptoms?" (e.g., fever)     No other symptoms 9. PREGNANCY: "Is there any chance you are pregnant?" "When was your last menstrual period?"     no  Protocols used: Rash or Redness - Localized-A-AH

## 2023-05-27 NOTE — Telephone Encounter (Signed)
Fyi- she came in yesterday for a weight check and this wasn't mentioned.

## 2023-05-27 NOTE — Telephone Encounter (Signed)
Copied From CRM [id number]. Reason for Triage: spots popped up on her face started a week ago. patient doesn't know if it from something she ate. not itching or getting worse. callback number is [phone]  1215 No answer.

## 2023-06-01 NOTE — Telephone Encounter (Signed)
Copied from CRM [id number]. Topic: Clinical - Lab/Test Results >> Jun 01, 2023  8:46 AM Gaetano Hawthorne wrote: Reason for CRM: Patient is calling regarding her lab results - she would like to know whether she should take her Vitamin D or not.

## 2023-06-02 ENCOUNTER — Telehealth: Payer: Self-pay | Admitting: Family Medicine

## 2023-06-02 ENCOUNTER — Telehealth: Payer: Self-pay

## 2023-06-02 NOTE — Telephone Encounter (Signed)
According to the note left in chart by referral team that they will be calling to schedule today.

## 2023-06-02 NOTE — Telephone Encounter (Signed)
Copied from CRM [id number]. Topic: General - Other >> Jun 02, 2023  8:24 AM Dondra Prader A wrote: Reason for CRM: Patient states that she had a missed phone call form the office this morning. No notes in Epic. Please call patient back.

## 2023-06-02 NOTE — Telephone Encounter (Signed)
Needs to clarify where patient going 2 different referral appointment scheduled   Copied from CRM [id number]. Topic: Referral - Question >> Jun 02, 2023 11:10 AM Thomes Dinning wrote: Reason for CRM: Patient calling to get info on where her PT referral was sent. She states no one has contacted her to get the PT started and she really needs it. She would like a call back at [phone] (M)

## 2023-06-02 NOTE — Telephone Encounter (Signed)
I don't see any notes that anyone called

## 2023-06-02 NOTE — Telephone Encounter (Signed)
Patient called in to advise that she needed a new referral for PT sent by Dr Epimenio Foot. During the discussion, I mentioned that her PCP sent a referral for her and that they should be reaching out to her today to schedule. She is going to wait and see if she hears from them

## 2023-06-06 NOTE — Progress Notes (Signed)
 Please inform the patient that her labs are stable and at her baseline. Her Hemoglobin A1c, thyroid function, vitamin D, and cholesterol levels are all within stable ranges. No immediate concerns, but she should continue her current treatment regimen and lifestyle modifications as recommended.

## 2023-06-07 ENCOUNTER — Ambulatory Visit (INDEPENDENT_AMBULATORY_CARE_PROVIDER_SITE_OTHER): Payer: 59

## 2023-06-07 DIAGNOSIS — R339 Retention of urine, unspecified: Secondary | ICD-10-CM | POA: Diagnosis not present

## 2023-06-07 NOTE — Progress Notes (Signed)
 Suprapubic Cath Change  Patient is present today for a suprapubic catheter change due to urinary retention.  25ml of water was drained from the balloon, a 22FR foley cath was removed from the tract with out difficulty.  Suprapubic catheter site was cleaned and prepped in a sterile fashion with Betadinex3  A 22FR foley cath was replaced into the tract no complications were noted. Urine return was noted, urine Pink in color . 10 ml of sterile water was inflated into the balloon and a bedside bag was attached for drainage.  Patient tolerated well. A leg bag was given to patient and proper instruction was given on how to switch bags.    Performed by: Rosaria Kubin LPN  Follow up: 4 weeks

## 2023-06-07 NOTE — Patient Instructions (Signed)

## 2023-06-07 NOTE — Telephone Encounter (Signed)
 Copied from CRM [id number]. Topic: General - Call Back - No Documentation >> Jun 07, 2023  2:30 PM Higinio Roger wrote: Reason for CRM: Patient missed a call and stated she could not understand voicemail. She is requesting a callback at [phone]

## 2023-06-07 NOTE — Telephone Encounter (Signed)
 [email or web address]

## 2023-06-19 NOTE — Therapy (Unsigned)
 OUTPATIENT PHYSICAL THERAPY LOWER EXTREMITY EVALUATION   Patient Name: Melissa West MRN: [id number] DOB:1991-02-27, 33 y.o., female Today's Date: 06/19/2023  END OF SESSION:   Past Medical History:  Diagnosis Date   MS (multiple sclerosis) (HCC)    No pertinent past medical history    Past Surgical History:  Procedure Laterality Date   CESAREAN SECTION  08/09/2011   Procedure: CESAREAN SECTION;  Surgeon: Lesly Dukes, MD;  Location: [facility];  Service: Gynecology;  Laterality: N/A;   IR CATHETER TUBE CHANGE  07/22/2021   IR CATHETER TUBE CHANGE  09/10/2021   Patient Active Problem List   Diagnosis Date Noted   STD (female) 12/31/2022   Hypokalemia 10/09/2022   Lower extremity weakness 10/08/2022   UTI (urinary tract infection) 11/04/2021   Osteomyelitis (HCC) 11/04/2021   Sepsis (HCC) 06/17/2021   Chronic anemia 05/20/2021   Abnormal finding of diagnostic imaging--Abnormal CT AP 05/19/2021   Bacteremia 05/17/2021    Class: Acute   Chest pain 05/16/2021   Hypotension 05/15/2021    Class: Acute   Sepsis secondary to UTI (HCC) 05/14/2021   Leukocytosis 03/08/2021   Acute on chronic urinary retention 03/08/2021   Pressure injury of skin 03/08/2021   AKI (acute kidney injury) (HCC) 03/07/2021   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   Ataxia 11/29/2018   Nystagmus 11/29/2018   MS (multiple sclerosis) (HCC)     PCP: Gilmore Laroche, FNP  REFERRING PROVIDER: Gilmore Laroche, FNP  REFERRING DIAG: R29.898 (ICD-10-CM) - Weakness of both lower extremities  THERAPY DIAG:  No diagnosis found.  Rationale for Evaluation and Treatment: Rehabilitation  ONSET DATE: chronic  SUBJECTIVE:   SUBJECTIVE STATEMENT: Melissa West is a 33 y.o. female with past medical history of MS and  lower extremity weakness presents for f/u of  chronic medical conditions. For the details of today's visit, please refer to the assessment and plan.  PERTINENT HISTORY: Assessment &  Plan: The patient complains of worsening muscle weakness and spasms in the lower extremities. She is wheelchair-bound and reports decreased active and passive range of motion (ROM) in the lower extremities. Otherwise, she is neurovascularly intact. No recent falls or injuries were reported. PAIN:  Are you having pain? {OPRCPAIN:27236}  PRECAUTIONS: Other: WC bound  RED FLAGS: None   WEIGHT BEARING RESTRICTIONS: No  FALLS:  Has patient fallen in last 6 months? No  LIVING ENVIRONMENT: Lives with: {OPRC lives with:25569::"lives with their family"} Lives in: {Lives in:25570} Stairs: {opstairs:27293} Has following equipment at home: {Assistive devices:23999}  OCCUPATION: not working  PLOF: Independent  PATIENT GOALS: ***  NEXT MD VISIT: TBD  OBJECTIVE:  Note: Objective measures were completed at Evaluation unless otherwise noted.  DIAGNOSTIC FINDINGS: none  PATIENT SURVEYS:  Patient-specific activity scoring scheme (Point to one number):  "0" represents "unable to perform." "10" represents "able to perform at prior level. 0 1 2 3 4 5 6 7 8 9  10 (Date and Score) Activity Initial  Activity Eval                      Additional Additional Total score = sum of the activity scores/number of activities Minimum detectable change (90%CI) for average score = 2 points Minimum detectable change (90%CI) for single activity score = 3 points PSFS developed by: Jake Seats., & Binkley, J. (1995). Assessing disability and change on individual  patients: a report of a patient specific measure. Physiotherapy [place], 47, [phone]. Reproduced with the permission of the authors  Score:   MUSCLE LENGTH: Hamstrings: Right *** deg; Left *** deg Maisie Fus test: Right *** deg; Left *** deg  POSTURE: {posture:25561}  PALPATION: ***  LOWER EXTREMITY ROM:  {AROM/PROM:27142} ROM Right eval Left eval  Hip flexion    Hip extension    Hip abduction    Hip  adduction    Hip internal rotation    Hip external rotation    Knee flexion    Knee extension    Ankle dorsiflexion    Ankle plantarflexion    Ankle inversion    Ankle eversion     (Blank rows = not tested)  LOWER EXTREMITY MMT:  MMT Right eval Left eval  Hip flexion    Hip extension    Hip abduction    Hip adduction    Hip internal rotation    Hip external rotation    Knee flexion    Knee extension    Ankle dorsiflexion    Ankle plantarflexion    Ankle inversion    Ankle eversion     (Blank rows = not tested)  LOWER EXTREMITY SPECIAL TESTS:  Deferred based on Dx  FUNCTIONAL TESTS:  Sitting balance  GAIT: Patient is WC bound                                                                                                                                TREATMENT DATE: ***    PATIENT EDUCATION:  Education details: Discussed eval findings, rehab rationale and POC and patient is in agreement  Person educated: Patient Education method: Explanation Education comprehension: verbalized understanding and needs further education  HOME EXERCISE PROGRAM: ***  ASSESSMENT:  CLINICAL IMPRESSION: Patient is a 33 y.o. female who was seen today for physical therapy evaluation and treatment for BLE spasms, loss of mobility and a decreased functional status due to a chronic and progressive neurological condition.   OBJECTIVE IMPAIRMENTS: {opptimpairments:25111}.   ACTIVITY LIMITATIONS: {activitylimitations:27494}  PERSONAL FACTORS: {Personal factors:25162} are also affecting patient's functional outcome.   REHAB POTENTIAL: Fair based on chronic neurological Dx  CLINICAL DECISION MAKING: Stable/uncomplicated  EVALUATION COMPLEXITY: Low   GOALS: Goals reviewed with patient? No  SHORT TERM GOALS: Target date: *** Patient to demonstrate independence in HEP  Baseline: Goal status: INITIAL  2.  Patient will score at least ***% on FOTO to signify clinically  meaningful improvement in functional abilities.   Baseline:  Goal status: INITIAL  3.  *** Baseline:  Goal status: INITIAL  4.  *** Baseline:  Goal status: INITIAL  5.  *** Baseline:  Goal status: INITIAL  6.  *** Baseline:  Goal status: INITIAL    PLAN:  PT FREQUENCY: 2x/week  PT DURATION: 4 weeks  PLANNED INTERVENTIONS: 97164- PT Re-evaluation, 97110-Therapeutic exercises, 97530- Therapeutic activity, 97112- Neuromuscular re-education, 97535- Self Care, 40981- Manual therapy, and Patient/Family education  PLAN FOR NEXT SESSION: HEP review and update, manual techniques as appropriate, aerobic tasks, ROM and  flexibility activities, strengthening and PREs, TPDN, gait and balance training as needed     Hildred Laser, PT 06/19/2023, 11:43 AM

## 2023-06-20 ENCOUNTER — Ambulatory Visit: Payer: 59 | Attending: Family Medicine

## 2023-06-20 ENCOUNTER — Other Ambulatory Visit: Payer: Self-pay

## 2023-06-20 DIAGNOSIS — M6281 Muscle weakness (generalized): Secondary | ICD-10-CM | POA: Insufficient documentation

## 2023-06-20 DIAGNOSIS — R262 Difficulty in walking, not elsewhere classified: Secondary | ICD-10-CM | POA: Insufficient documentation

## 2023-06-20 DIAGNOSIS — G35 Multiple sclerosis: Secondary | ICD-10-CM | POA: Insufficient documentation

## 2023-06-20 DIAGNOSIS — R29898 Other symptoms and signs involving the musculoskeletal system: Secondary | ICD-10-CM | POA: Insufficient documentation

## 2023-06-21 NOTE — Telephone Encounter (Signed)
 Copied from CRM [id number]. Topic: General - Other >> Jun 21, 2023 12:08 PM Fredrich Romans wrote: Reason for CRM: Patient called in stating that she went to [facility] rehab center yesterday and they told her that she needs to be seen somewhere lese due to her not being able to stand up. They said that they gave her the number [phone] for [facility], and said that she should be seen there due to them having more equipment that would help her.

## 2023-06-24 ENCOUNTER — Other Ambulatory Visit: Payer: Self-pay | Admitting: Family Medicine

## 2023-06-25 ENCOUNTER — Other Ambulatory Visit: Payer: Self-pay | Admitting: Family Medicine

## 2023-06-25 DIAGNOSIS — E559 Vitamin D deficiency, unspecified: Secondary | ICD-10-CM

## 2023-06-25 MED ORDER — VITAMIN D (ERGOCALCIFEROL) 1.25 MG (50000 UNIT) PO CAPS: 50000.0000 [IU] | ORAL_CAPSULE | ORAL | 1 refills | Status: DC

## 2023-06-27 ENCOUNTER — Other Ambulatory Visit: Payer: Self-pay

## 2023-06-27 DIAGNOSIS — R29898 Other symptoms and signs involving the musculoskeletal system: Secondary | ICD-10-CM

## 2023-06-27 DIAGNOSIS — G35 Multiple sclerosis: Secondary | ICD-10-CM

## 2023-06-27 DIAGNOSIS — Z993 Dependence on wheelchair: Secondary | ICD-10-CM

## 2023-06-27 NOTE — Telephone Encounter (Signed)
 completed

## 2023-06-27 NOTE — Telephone Encounter (Signed)
 Referral placed waiting on cosign

## 2023-07-05 ENCOUNTER — Encounter: Payer: Self-pay | Admitting: Physical Therapy

## 2023-07-05 ENCOUNTER — Ambulatory Visit: Admitting: Physical Therapy

## 2023-07-05 VITALS — BP 104/77 | HR 94

## 2023-07-05 DIAGNOSIS — R262 Difficulty in walking, not elsewhere classified: Secondary | ICD-10-CM | POA: Diagnosis not present

## 2023-07-05 DIAGNOSIS — M6281 Muscle weakness (generalized): Secondary | ICD-10-CM | POA: Diagnosis not present

## 2023-07-05 DIAGNOSIS — R29898 Other symptoms and signs involving the musculoskeletal system: Secondary | ICD-10-CM | POA: Diagnosis not present

## 2023-07-05 DIAGNOSIS — G35 Multiple sclerosis: Secondary | ICD-10-CM | POA: Diagnosis not present

## 2023-07-05 NOTE — Therapy (Unsigned)
 OUTPATIENT PHYSICAL THERAPY LOWER EXTREMITY/NEURO RE-CERT   Patient Name: Melissa West MRN: [id number] DOB:Mar 01, 1991, 33 y.o., female Today's Date: 07/06/2023  END OF SESSION:  PT End of Session - 07/05/23 1408     Visit Number 2    Number of Visits 6    Date for PT Re-Evaluation 08/17/23    Authorization Type [employer] Medicare - Medicaid [place] secondary    Progress Note Due on Visit 10    PT Start Time 1407    PT Stop Time 1445    PT Time Calculation (min) 38 min    Equipment Utilized During Treatment Gait belt    Activity Tolerance Patient tolerated treatment well    Behavior During Therapy Agitated             Past Medical History:  Diagnosis Date   MS (multiple sclerosis) (HCC)    No pertinent past medical history    Past Surgical History:  Procedure Laterality Date   CESAREAN SECTION  08/09/2011   Procedure: CESAREAN SECTION;  Surgeon: Lesly Dukes, MD;  Location: [facility];  Service: Gynecology;  Laterality: N/A;   IR CATHETER TUBE CHANGE  07/22/2021   IR CATHETER TUBE CHANGE  09/10/2021   Patient Active Problem List   Diagnosis Date Noted   STD (female) 12/31/2022   Hypokalemia 10/09/2022   Lower extremity weakness 10/08/2022   UTI (urinary tract infection) 11/04/2021   Osteomyelitis (HCC) 11/04/2021   Sepsis (HCC) 06/17/2021   Chronic anemia 05/20/2021   Abnormal finding of diagnostic imaging--Abnormal CT AP 05/19/2021   Bacteremia 05/17/2021    Class: Acute   Chest pain 05/16/2021   Hypotension 05/15/2021    Class: Acute   Sepsis secondary to UTI (HCC) 05/14/2021   Leukocytosis 03/08/2021   Acute on chronic urinary retention 03/08/2021   Pressure injury of skin 03/08/2021   AKI (acute kidney injury) (HCC) 03/07/2021   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   Ataxia 11/29/2018   Nystagmus 11/29/2018   MS (multiple sclerosis) (HCC)     PCP: Gilmore Laroche, FNP  REFERRING PROVIDER: Gilmore Laroche, FNP  REFERRING DIAG: R29.898 (ICD-10-CM) - Weakness  of both lower extremities  THERAPY DIAG:  Difficulty in walking, not elsewhere classified - Plan: PT plan of care cert/re-cert  Muscle weakness (generalized) - Plan: PT plan of care cert/re-cert  Multiple sclerosis (HCC) - Plan: PT plan of care cert/re-cert  Rationale for Evaluation and Treatment: Rehabilitation  ONSET DATE: chronic  SUBJECTIVE:   SUBJECTIVE STATEMENT: Patient arrives in manual wheelchair in hoyer lift. Patient reports that she has a catheter. She denies any wounds or skin breakdown on her bottom. Patient reports that most hours of the day she is sitting in manual chair. Patient reports that she is always transferring with a hoyer lift and she cannot recall the last time she transferred with something other than a hoyer lift. She reports she is spending most waking hours in chair. Denies pressure injury but does have them per chart review. Patient reports she has difficulty sitting up in bed. Patient wants to be able to sit up and walk. She is unsure of when she was last walking. Patient has the same aid every day. Patient is a bit of a poor historian.   PERTINENT HISTORY: Relapsing remitting MS  PAIN:  Are you having pain? No  PRECAUTIONS: Other: WC bound  RED FLAGS: None   WEIGHT BEARING RESTRICTIONS: No  FALLS:  Has patient fallen in last 6 months? No  LIVING ENVIRONMENT: Lives with:  lives alone - patient has an aid come in everyday but she is unsure of how many hours a day, but sounds like they are there most of the days Lives in: House/apartment Stairs: No - ramped entry  Has following equipment at home: Wheelchair (manual)  OCCUPATION: not working  PLOF: Independent  PATIENT GOALS: "To walk again and be able to sit up in bed."   NEXT MD VISIT: TBD  OBJECTIVE:  Note: Objective measures were completed at Evaluation unless otherwise noted.  DIAGNOSTIC FINDINGS: none  PATIENT SURVEYS:  Patient-specific activity scoring scheme (Point to one  number):  "0" represents "unable to perform." "10" represents "able to perform at prior level. 0 1 2 3 4 5 6 7 8 9  10 (Date and Score) Activity Initial  Activity Eval     transfer  0    stand  0    walk 0    Additional Additional Total score = sum of the activity scores/number of activities Minimum detectable change (90%CI) for average score = 2 points Minimum detectable change (90%CI) for single activity score = 3 points PSFS developed by: Jake Seats., & Binkley, J. (1995). Assessing disability and change on individual  patients: a report of a patient specific measure. Physiotherapy [place], 47, [phone].                                                                                                                               TREATMENT:   TherAct (Assessment of Physical Function):  MUSCLE LENGTH: Patient presents with marked contractions   POSTURE/WHEELCHAIR: Posterior pelvic tilt, plantarflexed RLE, hip adduction - chair is very poor fitting and not appropriate for patient, patient needs assistance to propel wheelchair as only able to steer with RUE at very poor pace and tends to turn wheelchair in a circle   LOWER EXTREMITY ROM:  Passive ROM *Very limited due to severe spasticity with limit 3-4 Right eval Left eval  Hip flexion 90 - spastic response if attempted to move 90 - spastic response if attempted to move  Hip extension    Hip abduction Spastic response past neutral Spastic response past neutral  Hip adduction    Hip internal rotation    Hip external rotation    Knee flexion Given spasticity in today's session unable to flex past 90 degrees Given spasticity in today's session unable to flex past 90 degrees  Knee extension Given spasticity in today's session unable to extend past 85 degrees Given spasticity in today's session unable to extend past 85 degrees  Ankle dorsiflexion Lacking 5 degrees Lacking 5 degrees  Ankle plantarflexion     Ankle inversion    Ankle eversion     (Blank rows = not tested)  LOWER EXTREMITY MMT: deferred due to spasticity Trace movements of LE but extremely limited due to spasticity range from 3-4 on Modified Ashworth Scale  FUNCTIONAL TESTS:  Sitting balance to be assessed further,  patient able to boost back in chair a few inches but limited due to extent of spasticity   GAIT: Patient is WC bound, non-ambulatory, and requires maxA/hoyer to transfer out of chair.   Self Care: Educated patient on findings, recommend new wheelchair evaluation and patient in agreement, explained that walking not functional goal at this time given extent of LE spasticity and recommend brief trial of PT to work on sitting balance and management of LE tone, patient initially agitated and then open to trial  PATIENT EDUCATION:  Education details: Discussed function findings, rehab rationale and POC and patient is in agreement, see self care session for details  Person educated: Patient Education method: Explanation Education comprehension: verbalized understanding and needs further education  HOME EXERCISE PROGRAM: To be provided   ASSESSMENT:  CLINICAL IMPRESSION: Patient is a 33 y.o. female who transferred over to this clinic from ortho and being seen for significant mobility restrictions. Patient is chair bound with LE contractures due to extent of spacticity, very poor fitting manual wheelchair, hoyer dependent, and to be assessed further but per patient report very poor fitting sitting balance. Patient with goal of working on walking; however, given extent of function impairments, recommend focus on LE spacticity management and trial of sitting balance but educated patient that walking not a functional goal at this time. Patient initially agitated but then verbalized understanding and agreement wishing to still do a trial of PT. Also recommend new wheelchair eval likely for power chair for better pressure relief  and positioning as current chair is only exacerbating impairments. PT will reach out to medical team for request.   OBJECTIVE IMPAIRMENTS: decreased mobility.   ACTIVITY LIMITATIONS: transfers  PERSONAL FACTORS: 1 comorbidity: MS  are also affecting patient's functional outcome.   REHAB POTENTIAL: Fair based on chronic neurological Dx  CLINICAL DECISION MAKING: Stable/uncomplicated  EVALUATION COMPLEXITY: Low  GOALS: Goals reviewed with patient? Yes  LONG TERM GOALS:  Target date:08/17/2023  Patient and caregiver will report demonstrate independence with final HEP with emphasis on ROM and spasticity management in order to maintain current gains and continue to progress after physical therapy discharge.   Baseline: To be provided  Goal status: INITIAL  2.  Sitting balance to be assessed / write goal as indicated  Baseline: To be provided Goal status: INITIAL  3.  Patient will receive a referral for new wheelchair eval to promote better posturing and prevent further LE contractures and skin breakdown.  Baseline: To be provided  Goal status: INITIAL  PLAN:  PT FREQUENCY: 1x  PT DURATION: 4 weeks   PLANNED INTERVENTIONS: 97164- PT Re-evaluation, 97110-Therapeutic exercises, 97530- Therapeutic activity, 97112- Neuromuscular re-education, 97535- Self Care, 40981- Manual therapy, and Patient/Family education  PLAN FOR NEXT SESSION: assess sitting balance with plus 2 hoyer lift, basic HEP for spacticity management and train caregiver as possible, may reach out to PCP about additional social support in home, consider OT referral or speech, have we received W/C eval   Carmelia Bake, PT, DPT 07/06/2023, 9:52 AM

## 2023-07-06 ENCOUNTER — Encounter: Payer: Self-pay | Admitting: Physical Therapy

## 2023-07-06 ENCOUNTER — Telehealth: Payer: Self-pay | Admitting: Physical Therapy

## 2023-07-06 NOTE — Telephone Encounter (Signed)
 Good morning,  Melissa West was evaluated by physical therapy on 07/05/2023.  The patient would benefit from wheelchair evaluation for reduced risk of skin breakdown, spasticity management, and positioning.  Patient is currently in a manual chair that is not appropriate for current function.  If you agree, please place an order in [facility] workque in [facility] or fax the order to [phone].  Thank you, Maryruth Eve, PT, DPT   [facility] [address] [place], [place]  [address] Phone:  [phone] Fax:  [phone]

## 2023-07-11 ENCOUNTER — Ambulatory Visit (INDEPENDENT_AMBULATORY_CARE_PROVIDER_SITE_OTHER): Payer: 59

## 2023-07-11 DIAGNOSIS — R339 Retention of urine, unspecified: Secondary | ICD-10-CM | POA: Diagnosis not present

## 2023-07-11 NOTE — Patient Instructions (Signed)

## 2023-07-11 NOTE — Progress Notes (Signed)
 Suprapubic Cath Change  Patient is present today for a suprapubic catheter change due to urinary retention.  10ml of water was drained from the balloon, a 22FR foley cath was removed from the tract with out difficulty.  Suprapubic catheter site was cleaned and prepped in a sterile fashion with Betadinex3  A 22FR foley cath was replaced into the tract no complications were noted. Urine return was noted, urine Clear yellow in color . 10 ml of sterile water was inflated into the balloon and a leg bag was attached for drainage.  Patient tolerated well. A night bag was given to patient and proper instruction was given on how to switch bags.    Performed by: Mason Dibiasio LPN  Follow up: 4 weeks

## 2023-07-12 NOTE — Telephone Encounter (Signed)
 Referral placed.

## 2023-07-19 ENCOUNTER — Ambulatory Visit: Attending: Family Medicine | Admitting: Physical Therapy

## 2023-07-19 DIAGNOSIS — M6281 Muscle weakness (generalized): Secondary | ICD-10-CM | POA: Insufficient documentation

## 2023-07-19 DIAGNOSIS — R262 Difficulty in walking, not elsewhere classified: Secondary | ICD-10-CM | POA: Insufficient documentation

## 2023-07-27 ENCOUNTER — Encounter: Payer: Self-pay | Admitting: Physical Therapy

## 2023-07-27 ENCOUNTER — Ambulatory Visit: Admitting: Physical Therapy

## 2023-07-27 VITALS — BP 102/80 | HR 100

## 2023-07-27 DIAGNOSIS — M6281 Muscle weakness (generalized): Secondary | ICD-10-CM

## 2023-07-27 DIAGNOSIS — R262 Difficulty in walking, not elsewhere classified: Secondary | ICD-10-CM | POA: Diagnosis not present

## 2023-07-27 NOTE — Therapy (Signed)
 OUTPATIENT PHYSICAL THERAPY LOWER EXTREMITY   Patient Name: Sheng Pritz MRN: [id number] DOB:04/09/1991, 33 y.o., female Today's Date: 07/27/2023  END OF SESSION:  PT End of Session - 07/27/23 1312     Visit Number 3    Number of Visits 6    Date for PT Re-Evaluation 08/17/23    Authorization Type UHC Medicare - Medicaid [place] secondary    Authorization Time Period "No auth, no VL"    Progress Note Due on Visit 10    PT Start Time 1315    PT Stop Time 1358    PT Time Calculation (min) 43 min    Equipment Utilized During Treatment Gait belt    Activity Tolerance Patient tolerated treatment well;Other (comment)   spasticity and decreased social support   Behavior During Therapy WFL for tasks assessed/performed             Past Medical History:  Diagnosis Date   MS (multiple sclerosis) (HCC)    No pertinent past medical history    Past Surgical History:  Procedure Laterality Date   CESAREAN SECTION  08/09/2011   Procedure: CESAREAN SECTION;  Surgeon: Lesly Dukes, MD;  Location: [facility];  Service: Gynecology;  Laterality: N/A;   IR CATHETER TUBE CHANGE  07/22/2021   IR CATHETER TUBE CHANGE  09/10/2021   Patient Active Problem List   Diagnosis Date Noted   STD (female) 12/31/2022   Hypokalemia 10/09/2022   Lower extremity weakness 10/08/2022   UTI (urinary tract infection) 11/04/2021   Osteomyelitis (HCC) 11/04/2021   Sepsis (HCC) 06/17/2021   Chronic anemia 05/20/2021   Abnormal finding of diagnostic imaging--Abnormal CT AP 05/19/2021   Bacteremia 05/17/2021    Class: Acute   Chest pain 05/16/2021   Hypotension 05/15/2021    Class: Acute   Sepsis secondary to UTI (HCC) 05/14/2021   Leukocytosis 03/08/2021   Acute on chronic urinary retention 03/08/2021   Pressure injury of skin 03/08/2021   AKI (acute kidney injury) (HCC) 03/07/2021   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   Ataxia 11/29/2018   Nystagmus 11/29/2018   MS (multiple sclerosis) (HCC)     PCP:  Gilmore Laroche, FNP  REFERRING PROVIDER: Gilmore Laroche, FNP  REFERRING DIAG: R29.898 (ICD-10-CM) - Weakness of both lower extremities  THERAPY DIAG:  Difficulty in walking, not elsewhere classified  Muscle weakness (generalized)  Rationale for Evaluation and Treatment: Rehabilitation  ONSET DATE: chronic  SUBJECTIVE:   SUBJECTIVE STATEMENT: Patient reports no pain or changes since last seen here for PT. Denies falls and near falls. Arrives in manual wheelchair with hoyer pad under her. Arrives with theraband tied around her legs which she reports transportation just did to manage her spasticity but it comes off as soon as she is home.  PERTINENT HISTORY: Relapsing remitting MS  PAIN:  Are you having pain? No  PRECAUTIONS: Other: WC bound  RED FLAGS: None   WEIGHT BEARING RESTRICTIONS: No  FALLS:  Has patient fallen in last 6 months? No  LIVING ENVIRONMENT: Lives with: lives alone - patient has an aid come in everyday but she is unsure of how many hours a day, but sounds like they are there most of the days Lives in: House/apartment Stairs: No - ramped entry  Has following equipment at home: Wheelchair (manual)  OCCUPATION: not working  PLOF: Independent  PATIENT GOALS: "To walk again and be able to sit up in bed."   NEXT MD VISIT: TBD  OBJECTIVE:  Note: Objective measures were completed at Evaluation  unless otherwise noted.  DIAGNOSTIC FINDINGS: none  PATIENT SURVEYS:  Patient-specific activity scoring scheme (Point to one number):  "0" represents "unable to perform." "10" represents "able to perform at prior level. 0 1 2 3 4 5 6 7 8 9  10 (Date and Score) Activity Initial  Activity Eval     transfer  0    stand  0    walk 0    Additional Additional Total score = sum of the activity scores/number of activities Minimum detectable change (90%CI) for average score = 2 points Minimum detectable change (90%CI) for single activity score = 3  points PSFS developed by: Jake Seats., & Binkley, J. (1995). Assessing disability and change on individual  patients: a report of a patient specific measure. Physiotherapy [place], 47, [phone].                                                                                                                               TREATMENT:   Self Care: Vitals:   07/27/23 1317  BP: 102/80  Pulse: 100  Seated assessed on LUE at rest, denies dizziness or lightheadedness throughout session Discussed wheelchair referral again with patient, PT explained that are waiting for an updated order to come through and then will work on scheduling, reviewed again with patient why PT recommending a different device to improve patient's independence and positioning as patient currently in significant adduction and very poor control of spasticity in current chair   *Plus two from a second therapist used for duration of the physical therapy session   TherAct:  Total A via hoyer transfer from manual wheelchair to low mat, intially patient leaned against back of chair with pillow but tone and trunk control so poor with patient tendency to collapse to L so adjusted support as noted below, feet propped on 4" step with high low mat on lowest setting  Second therapist behind initially seated on green exercise ball providing mod-maxA for trunk support on unstable surface, regressed to second therapist in tall knee with trunk providing back support again mix of mod-maxA, other therapist providing intermittent mod-maxA for trunk support before initiating reaching tasks Patient stabilizing on mat with UE but required mod verbal cues from PT to bring hands forward to prevent falling into extension Cued crossbody reaching and same side reaching to therapist x ~15 reps of each over course of session with mod-maxA for trunk support with non reaching hand stabilizing UE Education on head hips relationship and  bring head forward to for anterior weight shift to prevent from falling into extension, progressed from maxA to [facility] before fatiguing  Total tolerance for EOM sitting ~25 minutes before transferred back via hoyer lift to manual wheelchair   PATIENT EDUCATION:  Education details: See self care session above Person educated: Patient Education method: Explanation Education comprehension: verbalized understanding and needs further education  HOME EXERCISE PROGRAM: To be provided   ASSESSMENT:  CLINICAL IMPRESSION: Skilled PT session emphasized assessment and work on sitting balance. Patient total assist for hoyer transfer and will plan to assess ability to direct care in future sessions which likely limited due to cognition. 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 Patient mod-maxA x 2 for sitting balance with tendency to collapse into extension though benefits from head hips training to cue for anterior weight shift. Continue POC as able.   OBJECTIVE IMPAIRMENTS: decreased mobility.   ACTIVITY LIMITATIONS: transfers  PERSONAL FACTORS: 1 comorbidity: MS  are also affecting patient's functional outcome.   REHAB POTENTIAL: Fair based on chronic neurological Dx  CLINICAL DECISION MAKING: Stable/uncomplicated  EVALUATION COMPLEXITY: Low  GOALS: Goals reviewed with patient? Yes  LONG TERM GOALS:  Target date:08/17/2023  Patient and caregiver will report demonstrate independence with final HEP with emphasis on ROM and spasticity management in order to maintain current gains and continue to progress after physical therapy discharge.   Baseline: To be provided  Goal status: INITIAL  2.  Patient will demonstrate ability to sit EOM for 30 seconds with modA x 1 and UE support on mat to demonstrate improved trunk control for upright sitting.  Baseline: Mod-MaxA x 2 Goal status: INITIAL  3.  Patient will receive a referral for new wheelchair  eval to promote better posturing and prevent further LE contractures and skin breakdown.  Baseline: To be provided  Goal status: INITIAL  PLAN:  PT FREQUENCY: 1x  PT DURATION: 4 weeks (past recert)   PLANNED INTERVENTIONS: 16109- PT Re-evaluation, 97110-Therapeutic exercises, 97530- Therapeutic activity, 97112- Neuromuscular re-education, 97535- Self Care, [id number]- Manual therapy, and Patient/Family education  PLAN FOR NEXT SESSION:  basic HEP for spacticity management and train caregiver as possible, consider OT referral or speech, have we received W/C eval, reach out to PCP about social support at home  Find out more about social support at home and if caregiver can come to future session for training, work on sitting EOM balance, could trial supine pending LE spasticity   Coreen Devoid, PT, DPT 07/27/2023, 4:59 PM

## 2023-07-27 NOTE — Telephone Encounter (Signed)
 I just wanted to follow up on this referral. For some reason, it did not come through correctly though I call tell that you all placed the referral. This may have been a tech glitch but wanted to make sure we still got it in. Could you please resend the referral to [facility] in [facility] or fax the order to [phone]. specifying that it is a wheelchair referral?   Thank you so much!  Camella Cave, PT, DPT

## 2023-08-03 ENCOUNTER — Encounter: Payer: Self-pay | Admitting: Physical Therapy

## 2023-08-03 ENCOUNTER — Ambulatory Visit: Admitting: Physical Therapy

## 2023-08-03 VITALS — BP 107/77 | HR 64

## 2023-08-03 DIAGNOSIS — M6281 Muscle weakness (generalized): Secondary | ICD-10-CM

## 2023-08-03 DIAGNOSIS — R262 Difficulty in walking, not elsewhere classified: Secondary | ICD-10-CM | POA: Diagnosis not present

## 2023-08-03 NOTE — Therapy (Signed)
 OUTPATIENT PHYSICAL THERAPY LOWER EXTREMITY TREATMENT   Patient Name: Melissa West MRN: [id number] DOB:February 15, 1991, 33 y.o., female Today's Date: 08/03/2023  END OF SESSION:  PT End of Session - 08/03/23 1510     Visit Number 4    Number of Visits 6    Date for PT Re-Evaluation 08/17/23    Authorization Type UHC Medicare - Medicaid [place] secondary    Authorization Time Period "No auth, no VL"    Progress Note Due on Visit 10    PT Start Time 1400    PT Stop Time 1447    PT Time Calculation (min) 47 min    Equipment Utilized During Treatment Gait belt;Other (comment)   hoyer lift pad   Activity Tolerance Patient tolerated treatment well;Other (comment)    Behavior During Therapy WFL for tasks assessed/performed             Past Medical History:  Diagnosis Date   MS (multiple sclerosis) (HCC)    No pertinent past medical history    Past Surgical History:  Procedure Laterality Date   CESAREAN SECTION  08/09/2011   Procedure: CESAREAN SECTION;  Surgeon: Rik Chasten, MD;  Location: [facility];  Service: Gynecology;  Laterality: N/A;   IR CATHETER TUBE CHANGE  07/22/2021   IR CATHETER TUBE CHANGE  09/10/2021   Patient Active Problem List   Diagnosis Date Noted   STD (female) 12/31/2022   Hypokalemia 10/09/2022   Lower extremity weakness 10/08/2022   UTI (urinary tract infection) 11/04/2021   Osteomyelitis (HCC) 11/04/2021   Sepsis (HCC) 06/17/2021   Chronic anemia 05/20/2021   Abnormal finding of diagnostic imaging--Abnormal CT AP 05/19/2021   Bacteremia 05/17/2021    Class: Acute   Chest pain 05/16/2021   Hypotension 05/15/2021    Class: Acute   Sepsis secondary to UTI (HCC) 05/14/2021   Leukocytosis 03/08/2021   Acute on chronic urinary retention 03/08/2021   Pressure injury of skin 03/08/2021   AKI (acute kidney injury) (HCC) 03/07/2021   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   Ataxia 11/29/2018   Nystagmus 11/29/2018   MS (multiple sclerosis) (HCC)     PCP:  Zarwolo, Gloria, FNP  REFERRING PROVIDER: Zarwolo, Gloria, FNP  REFERRING DIAG: R29.898 (ICD-10-CM) - Weakness of both lower extremities  THERAPY DIAG:  Difficulty in walking, not elsewhere classified  Muscle weakness (generalized)  Rationale for Evaluation and Treatment: Rehabilitation  ONSET DATE: chronic  SUBJECTIVE:   SUBJECTIVE STATEMENT: Patient denies acute changes or falls/near falls. Patient reports otherwise doing very well.   PERTINENT HISTORY: Relapsing remitting MS  PAIN:  Are you having pain? No  PRECAUTIONS: Other: WC bound  RED FLAGS: None   WEIGHT BEARING RESTRICTIONS: No  FALLS:  Has patient fallen in last 6 months? No  LIVING ENVIRONMENT: Lives with: lives alone - patient has an aid come in everyday but she is unsure of how many hours a day, but sounds like they are there most of the days Lives in: House/apartment Stairs: No - ramped entry  Has following equipment at home: Wheelchair (manual)  OCCUPATION: not working  PLOF: Independent  PATIENT GOALS: "To walk again and be able to sit up in bed."   NEXT MD VISIT: TBD  OBJECTIVE:  Note: Objective measures were completed at Evaluation unless otherwise noted.  DIAGNOSTIC FINDINGS: none  PATIENT SURVEYS:  Patient-specific activity scoring scheme (Point to one number):  "0" represents "unable to perform." "10" represents "able to perform at prior level. 0 1 2 3 4  5  6 7 8 9 10  (Date and Score) Activity Initial  Activity Eval     transfer  0    stand  0    walk 0    Additional Additional Total score = sum of the activity scores/number of activities Minimum detectable change (90%CI) for average score = 2 points Minimum detectable change (90%CI) for single activity score = 3 points PSFS developed by: [place] Spitz., & Binkley, J. (1995). Assessing disability and change on individual  patients: a report of a patient specific measure. Physiotherapy [place], 47,  [phone].                                                                                                                               TREATMENT:   Self Care: Vitals:   08/03/23 1412  BP: 107/77  Pulse: 64  Seated assessed on LUE at rest, denies dizziness or lightheadedness throughout session Discussed home care with patient, patient always with one care aid, discussed having care aid come to future session to work on education for spasticity management stretch program, patient verbalized understanding  *Plus two from a second therapist used for duration of the physical therapy session   TherAct:  Total A via hoyer transfer from manual wheelchair to low mat with chair and two pillow prop for support until therapist transitioned to tall kneel, educated patient on ways she can direct care including preference on unclipping strap herself, cues on if she likes being sat up more etc second therapist in tall knee with trunk providing back support maxA for initial EOM sitting with mix of mod-min for majority of remaining tasks EOM, two blue bolsters placed on either side of patient for lateral stability with foot step under feet  Patient stabilizing on mat with UE only requiring min cues for hand placement today and head forward to reduce extensor tendency Performed crunch sit up through partial ROM requiring mix of mod to CGA with good use of head hips relationship for anterior movement Cued crossbody reaching with use of cones on either side transitioning from one side to other x 5 reps (mod-minA) Lateral side lean to bolster and then return to upright sitting mix of CGA and minA in today's session x6 reps each side Total tolerance for EOM sitting ~30 minutes before transferred back via hoyer lift to manual wheelchair   PATIENT EDUCATION:  Education details: See self care session above Person educated: Patient Education method: Explanation Education comprehension: verbalized understanding and  needs further education  HOME EXERCISE PROGRAM: To be provided   ASSESSMENT:  CLINICAL IMPRESSION: Skilled PT session emphasized progression and review of EOM sitting tolerance with mix of mod-minA for majority of session with brief moments of CGA. Patient with improved retention of head hips relationship and was able to use functionally at end of session to bring self forward to unlock W/C breaks. Continue POC as able.   OBJECTIVE IMPAIRMENTS: decreased mobility.  ACTIVITY LIMITATIONS: transfers  PERSONAL FACTORS: 1 comorbidity: MS  are also affecting patient's functional outcome.   REHAB POTENTIAL: Fair based on chronic neurological Dx  CLINICAL DECISION MAKING: Stable/uncomplicated  EVALUATION COMPLEXITY: Low  GOALS: Goals reviewed with patient? Yes  LONG TERM GOALS:  Target date:08/17/2023  Patient and caregiver will report demonstrate independence with final HEP with emphasis on ROM and spasticity management in order to maintain current gains and continue to progress after physical therapy discharge.   Baseline: To be provided  Goal status: INITIAL  2.  Patient will demonstrate ability to sit EOM for 30 seconds with modA x 1 and UE support on mat to demonstrate improved trunk control for upright sitting.  Baseline: Mod-MaxA x 2 Goal status: INITIAL  3.  Patient will receive a referral for new wheelchair eval to promote better posturing and prevent further LE contractures and skin breakdown.  Baseline: To be provided  Goal status: INITIAL  PLAN:  PT FREQUENCY: 1x  PT DURATION: 4 weeks (past recert)   PLANNED INTERVENTIONS: 04540- PT Re-evaluation, 97110-Therapeutic exercises, 97530- Therapeutic activity, 97112- Neuromuscular re-education, 97535- Self Care, 98119- Manual therapy, and Patient/Family education  PLAN FOR NEXT SESSION:  basic HEP for spacticity management and train caregiver as possible, consider OT referral or speech, have we received W/C eval, reach out  to PCP about social support at home  Find out more about social support at home and if caregiver can come to future session for training, work on sitting EOM balance, could trial supine pending LE spasticity   Work on supine exercises and trial rolling tasks   Coreen Devoid, PT, DPT 08/03/2023, 3:25 PM

## 2023-08-10 ENCOUNTER — Other Ambulatory Visit: Payer: Self-pay | Admitting: Neurology

## 2023-08-10 ENCOUNTER — Ambulatory Visit: Admitting: Physical Therapy

## 2023-08-10 ENCOUNTER — Telehealth: Payer: Self-pay | Admitting: Physical Therapy

## 2023-08-10 ENCOUNTER — Ambulatory Visit (INDEPENDENT_AMBULATORY_CARE_PROVIDER_SITE_OTHER)

## 2023-08-10 VITALS — BP 101/75 | HR 105

## 2023-08-10 DIAGNOSIS — R339 Retention of urine, unspecified: Secondary | ICD-10-CM

## 2023-08-10 DIAGNOSIS — G35 Multiple sclerosis: Secondary | ICD-10-CM

## 2023-08-10 DIAGNOSIS — R262 Difficulty in walking, not elsewhere classified: Secondary | ICD-10-CM

## 2023-08-10 DIAGNOSIS — M6281 Muscle weakness (generalized): Secondary | ICD-10-CM

## 2023-08-10 DIAGNOSIS — Z993 Dependence on wheelchair: Secondary | ICD-10-CM

## 2023-08-10 NOTE — Telephone Encounter (Signed)
 Good morning,  Melissa West was evaluated by physical therapy on 07/05/2023.  The patient would benefit from wheelchair evaluation for reduced risk of skin breakdown, spasticity management, and positioning.  Patient is currently in a manual chair that is not appropriate for current function.  If you agree, please place an order in [facility] workque in [facility] or fax the order to [phone].  Thank you, Maryruth Eve, PT, DPT   [facility] [address] [place], [place]  [address] Phone:  [phone] Fax:  [phone]

## 2023-08-10 NOTE — Therapy (Signed)
 OUTPATIENT PHYSICAL THERAPY LOWER EXTREMITY TREATMENT / DISCHARGE   Patient Name: Melissa West MRN: [id number] DOB:09-Jul-1990, 33 y.o., female Today's Date: 08/10/2023  PHYSICAL THERAPY DISCHARGE SUMMARY  Visits from Start of Care: 5  Current functional level related to goals / functional outcomes: See below   Remaining deficits: Highly benefit from a new wheelchair, spasticity, fatigue, muscle weakness    Education / Equipment: Return for wheelchair eval when scheduled, if future PT plans of care recommend increased caregiver assistance    Patient agrees to discharge. Patient goals were not met. Patient is being discharged due to lack of progress.   END OF SESSION:  PT End of Session - 08/10/23 1453     Visit Number 5    Number of Visits 6    Date for PT Re-Evaluation 08/17/23    Authorization Type [employer] Medicare - Medicaid [place] secondary    Authorization Time Period "No auth, no VL"    Progress Note Due on Visit 10    PT Start Time 1409   limited by late arrival   PT Stop Time 1445    PT Time Calculation (min) 36 min    Equipment Utilized During Treatment Other (comment);Gait belt   hoyer lift   Activity Tolerance Patient tolerated treatment well    Behavior During Therapy WFL for tasks assessed/performed             Past Medical History:  Diagnosis Date   MS (multiple sclerosis) (HCC)    No pertinent past medical history    Past Surgical History:  Procedure Laterality Date   CESAREAN SECTION  08/09/2011   Procedure: CESAREAN SECTION;  Surgeon: Rik Chasten, MD;  Location: [facility];  Service: Gynecology;  Laterality: N/A;   IR CATHETER TUBE CHANGE  07/22/2021   IR CATHETER TUBE CHANGE  09/10/2021   Patient Active Problem List   Diagnosis Date Noted   STD (female) 12/31/2022   Hypokalemia 10/09/2022   Lower extremity weakness 10/08/2022   UTI (urinary tract infection) 11/04/2021   Osteomyelitis (HCC) 11/04/2021   Sepsis (HCC) 06/17/2021   Chronic  anemia 05/20/2021   Abnormal finding of diagnostic imaging--Abnormal CT AP 05/19/2021   Bacteremia 05/17/2021    Class: Acute   Chest pain 05/16/2021   Hypotension 05/15/2021    Class: Acute   Sepsis secondary to UTI (HCC) 05/14/2021   Leukocytosis 03/08/2021   Acute on chronic urinary retention 03/08/2021   Pressure injury of skin 03/08/2021   AKI (acute kidney injury) (HCC) 03/07/2021   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   Ataxia 11/29/2018   Nystagmus 11/29/2018   MS (multiple sclerosis) (HCC)     PCP: Zarwolo, Gloria, FNP  REFERRING PROVIDER: Zarwolo, Gloria, FNP  REFERRING DIAG: R29.898 (ICD-10-CM) - Weakness of both lower extremities  THERAPY DIAG:  Difficulty in walking, not elsewhere classified  Muscle weakness (generalized)  Rationale for Evaluation and Treatment: Rehabilitation  ONSET DATE: chronic  SUBJECTIVE:   SUBJECTIVE STATEMENT: Patient arrives to session in manual wheelchair. Arrives without caregiver which PT requested attend future sessions.   PERTINENT HISTORY: Relapsing remitting MS  PAIN:  Are you having pain? No  PRECAUTIONS: Other: WC bound  RED FLAGS: None   WEIGHT BEARING RESTRICTIONS: No  FALLS:  Has patient fallen in last 6 months? No  LIVING ENVIRONMENT: Lives with: lives alone - patient has an aid come in everyday but she is unsure of how many hours a day, but sounds like they are there most of the days Lives in:  House/apartment Stairs: No - ramped entry  Has following equipment at home: Wheelchair (manual)  OCCUPATION: not working  PLOF: Independent  PATIENT GOALS: "To walk again and be able to sit up in bed."   NEXT MD VISIT: TBD  OBJECTIVE:  Note: Objective measures were completed at Evaluation unless otherwise noted.  DIAGNOSTIC FINDINGS: none  PATIENT SURVEYS:  Patient-specific activity scoring scheme (Point to one number):  "0" represents "unable to perform." "10" represents "able to perform at prior  level. 0 1 2 3 4 5 6 7 8 9  10 (Date and Score) Activity Initial  Activity Eval     transfer  0    stand  0    walk 0    Additional Additional Total score = sum of the activity scores/number of activities Minimum detectable change (90%CI) for average score = 2 points Minimum detectable change (90%CI) for single activity score = 3 points PSFS developed by: [place] Spitz., & Binkley, J. (1995). Assessing disability and change on individual  patients: a report of a patient specific measure. Physiotherapy [place], 47, [phone].                                                                                                                               TREATMENT:   Self Care: Vitals:   08/10/23 1417  BP: 101/75  Pulse: (!) 105  Seated assessed on LUE at rest, denies dizziness or lightheadedness throughout session Discussed discharge and wheelchair referral being received, discussed benefits of new wheelchair including improved pressure relief and LE alignment   *Plus two from a second therapist used for duration of the physical therapy session   TherAct:  Total A via hoyer transfer from manual wheelchair to low mat, patient continues to not direct care despite education in previous sessions  second therapist in tall knee with trunk providing back support maxA for initial EOM sitting with mix of mod-min for majority of remaining tasks EOM, two blue bolsters placed on either side of patient for lateral stability with foot step under feet  Patient stabilizing on mat with UE only with intermittent min cues for hand placement and head/hips relation Cued crossbody reaching overhead and same side reaching with head hips/relationship with "high fives" to second PT 2 x 10 reps (mod-minA with increased challenging reaching with R hand, increased posterior instability) EOM sitting with hands in laps trial 3 reps x 5-10 seconds max with SBA otherwise minA to sit and  maintain Cross body reaching pulling squigz off mirror with mix of CGA-minA greatest challenge with R arm pulling and upper reaching x 10 squigz  Total tolerance for EOM sitting ~20 minutes before transferred back via hoyer lift to manual wheelchair and max-totalA for repositioning once in chair  PATIENT EDUCATION:  Education details: See self care session above + discharge Person educated: Patient Education method: Explanation Education comprehension: verbalized understanding  HOME EXERCISE PROGRAM: Discussed reaching tasks in  current chair; unable to provide more expansive HEP as caregiver never attended session  ASSESSMENT:  CLINICAL IMPRESSION: Patient is being discharged from PT due to lack of functional progress. Patient very limited in progress by lack of caregiver support with no caregiver attending sessions despite PT request, level of physical function, and current wheelchair. Patient has received wheelchair referral and will be scheduled in future. This PT would likely recommend power wheelchair pending on evaluation. Patient is agreeable to discharge and verbalizes understanding of reaching task for home though limited by positioning in current chair.    OBJECTIVE IMPAIRMENTS: decreased mobility.   ACTIVITY LIMITATIONS: transfers  PERSONAL FACTORS: 1 comorbidity: MS  are also affecting patient's functional outcome.   REHAB POTENTIAL: Fair based on chronic neurological Dx  CLINICAL DECISION MAKING: Stable/uncomplicated  EVALUATION COMPLEXITY: Low  GOALS: Goals reviewed with patient? Yes  LONG TERM GOALS:  Target date:08/17/2023  Patient and caregiver will report demonstrate independence with final HEP with emphasis on ROM and spasticity management in order to maintain current gains and continue to progress after physical therapy discharge.   Baseline: To be provided  Goal status: INITIAL  2.  Patient will demonstrate ability to sit EOM for 30 seconds with modA x 1 and  UE support on mat to demonstrate improved trunk control for upright sitting.  Baseline: Mod-MaxA x 2 Goal status: INITIAL  3.  Patient will receive a referral for new wheelchair eval to promote better posturing and prevent further LE contractures and skin breakdown.  Baseline: To be provided  Goal status: INITIAL  PLAN:  PT FREQUENCY: 1x  PT DURATION: 4 weeks (past recert)   PLANNED INTERVENTIONS: 82956- PT Re-evaluation, 97110-Therapeutic exercises, 97530- Therapeutic activity, [id number]- Neuromuscular re-education, 97535- Self Care, 21308- Manual therapy, and Patient/Family education  PLAN FOR NEXT SESSION: NA - D/C with updated HEP   Coreen Devoid, PT, DPT 08/10/2023, 3:15 PM

## 2023-08-10 NOTE — Progress Notes (Signed)
 Suprapubic Cath Change  Patient is present today for a suprapubic catheter change due to urinary retention.  10ml of water  was drained from the balloon, a 22FR foley cath was removed from the tract with out difficulty.  Suprapubic catheter site was cleaned and prepped in a sterile fashion with Betadinex3  A 22FR foley cath was replaced into the tract no complications were noted. Urine return was noted, urine Clear yellow in color . 10 ml of sterile water  was inflated into the balloon and a leg bag was attached for drainage.  Patient tolerated well. A night bag was given to patient and proper instruction was given on how to switch bags.    Performed by: Melvenia Stabs. CMA  Follow up: 4 weeks

## 2023-08-11 ENCOUNTER — Telehealth: Payer: Self-pay | Admitting: Neurology

## 2023-08-11 DIAGNOSIS — R252 Cramp and spasm: Secondary | ICD-10-CM

## 2023-08-11 DIAGNOSIS — G35 Multiple sclerosis: Secondary | ICD-10-CM

## 2023-08-11 DIAGNOSIS — Z993 Dependence on wheelchair: Secondary | ICD-10-CM

## 2023-08-11 DIAGNOSIS — R29898 Other symptoms and signs involving the musculoskeletal system: Secondary | ICD-10-CM

## 2023-08-11 NOTE — Telephone Encounter (Signed)
 Per Dr. Godwin Lat, ok to place PT referral. Referral placed.

## 2023-08-11 NOTE — Telephone Encounter (Signed)
 Patient requesting new referral for physical therapy to [facility] on [address]. Patient said need physical therapy to last longer because want to be able to move around.

## 2023-08-11 NOTE — Telephone Encounter (Signed)
 Last seen 10/18/22. Next f/u 10/31/23. Looks like Dr. Godwin Lat placed referral yesterday for wheelchair evaluation:

## 2023-09-01 ENCOUNTER — Telehealth: Payer: Self-pay | Admitting: Neurology

## 2023-09-01 NOTE — Telephone Encounter (Signed)
 Appointment details confirmed

## 2023-09-07 ENCOUNTER — Ambulatory Visit (INDEPENDENT_AMBULATORY_CARE_PROVIDER_SITE_OTHER)

## 2023-09-07 DIAGNOSIS — Z9889 Other specified postprocedural states: Secondary | ICD-10-CM

## 2023-09-07 DIAGNOSIS — R339 Retention of urine, unspecified: Secondary | ICD-10-CM

## 2023-09-07 NOTE — Progress Notes (Signed)
 Suprapubic Cath Change  Patient is present today for a suprapubic catheter change due to urinary retention.  10ml of water  was drained from the balloon, a 22FR foley cath was removed from the tract with out difficulty.  Suprapubic catheter site was cleaned and prepped in a sterile fashion with Betadinex3  A 22FR foley cath was replaced into the tract no complications were noted. Flushed return was noted, urine Clear yellow in color . 10 ml of sterile water  was inflated into the balloon and a leg bag was attached for drainage.  Patient tolerated well. A night bag was given to patient and proper instruction was given on how to switch bags.    Performed by: Gorden Latino, CMA  Follow up: 4 week cath change

## 2023-10-03 ENCOUNTER — Ambulatory Visit (INDEPENDENT_AMBULATORY_CARE_PROVIDER_SITE_OTHER)

## 2023-10-03 DIAGNOSIS — R339 Retention of urine, unspecified: Secondary | ICD-10-CM

## 2023-10-03 DIAGNOSIS — Z9889 Other specified postprocedural states: Secondary | ICD-10-CM

## 2023-10-03 NOTE — Progress Notes (Signed)
 Suprapubic Cath Change  Patient is present today for a suprapubic catheter change due to urinary retention.  1 0ml of water  was drained from the balloon, a 22FR foley cath was removed from the tract with out difficulty.  Suprapubic catheter site was cleaned and prepped in a sterile fashion with Betadinex3  A 22FR foley cath was replaced into the tract no complications were noted. Urine return was noted, 70 mm of sterile water   flush return clear and a few pieces of blood clots in color . 10 ml of sterile water  was inflated into the balloon and a leg bag was attached for drainage.  Patient tolerated well. A night bag was given to patient and proper instruction was given on how to switch bags.    Performed by: Carlos, CMA  Follow up: keep f/u and nurse visited

## 2023-10-07 ENCOUNTER — Telehealth: Payer: Self-pay

## 2023-10-07 NOTE — Telephone Encounter (Signed)
 LVM to inform patient we do not have a wheelchair scale and patient also needs to be rescheduled given [facility] is no longer here on Tuesdays. Patients were notified of this change a while ago she is the only one still on the schedule that day.

## 2023-10-07 NOTE — Telephone Encounter (Signed)
 Copied from CRM [id number]. Topic: Appointments - Scheduling Inquiry for Clinic >> Oct 07, 2023  9:14 AM Antwanette L wrote: Reason for CRM: Patient has an appt on 7/22 at 1:40pm with Gloria Zarwolo. The appt is to check the patient weight. The patient is currently in a wheelchair and cannot stand. The patient wants to know if the office has a scale that she can roll onto? Please contact the patient at [phone]

## 2023-10-07 NOTE — Telephone Encounter (Signed)
 We do not have that type of scale but the appt 7/22 is for a 6 month follow up, not just a weight check. Pls advise

## 2023-10-12 ENCOUNTER — Telehealth: Payer: Self-pay

## 2023-10-12 NOTE — Telephone Encounter (Signed)
 Copied from CRM [id number]. Topic: Appointments - Appointment Cancel/Reschedule >> Oct 12, 2023 10:23 AM Melissa West wrote: Patient/patient representative is calling to cancel or reschedule an appointment. Refer to attachments for appointment information.  Please call pt to reschedule appt that Dr Zarwolo had to cancel since she will be 000on Tuesdays.

## 2023-10-12 NOTE — Telephone Encounter (Signed)
 This CRM has been reported and instructed for the agent to call patient back and reschedule as this should have been done when patient called back.

## 2023-10-13 NOTE — Telephone Encounter (Signed)
 Specialist reached out to the office who told her to send a message to the office due to next available appointment not being until September.

## 2023-10-17 ENCOUNTER — Telehealth: Payer: Self-pay

## 2023-10-17 ENCOUNTER — Ambulatory Visit: Admitting: Urology

## 2023-10-17 NOTE — Telephone Encounter (Signed)
 Called to reschedule pt due to being scheduled w/ wrong provider pt mother notified us  that pt urine seems to be darker than normal. Pt mother asked if pt is having any UTI like symptoms pt mother stated no, pt mother was advised to increase pt water  intake and a message will be sent to NP for future advisement pt mother voiced her understanding

## 2023-10-18 NOTE — Telephone Encounter (Signed)
 Patient scheduled 7/24 at 2 LVM to inform pt of this appointment

## 2023-10-20 ENCOUNTER — Ambulatory Visit: Admitting: Urology

## 2023-10-21 ENCOUNTER — Ambulatory Visit: Admitting: Family Medicine

## 2023-10-23 ENCOUNTER — Other Ambulatory Visit: Payer: Self-pay

## 2023-10-23 ENCOUNTER — Encounter (HOSPITAL_COMMUNITY): Payer: Self-pay | Admitting: Emergency Medicine

## 2023-10-23 ENCOUNTER — Emergency Department (HOSPITAL_COMMUNITY)
Admission: EM | Admit: 2023-10-23 | Discharge: 2023-10-24 | Disposition: A | Discharge status: Home / Self Care | Attending: Emergency Medicine | Admitting: Emergency Medicine

## 2023-10-23 DIAGNOSIS — T83098A Other mechanical complication of other indwelling urethral catheter, initial encounter: Secondary | ICD-10-CM | POA: Insufficient documentation

## 2023-10-23 DIAGNOSIS — T83028A Displacement of other indwelling urethral catheter, initial encounter: Secondary | ICD-10-CM | POA: Diagnosis not present

## 2023-10-23 DIAGNOSIS — T83091A Other mechanical complication of indwelling urethral catheter, initial encounter: Secondary | ICD-10-CM | POA: Diagnosis not present

## 2023-10-23 DIAGNOSIS — T839XXA Unspecified complication of genitourinary prosthetic device, implant and graft, initial encounter: Secondary | ICD-10-CM

## 2023-10-23 DIAGNOSIS — I499 Cardiac arrhythmia, unspecified: Secondary | ICD-10-CM | POA: Diagnosis not present

## 2023-10-23 DIAGNOSIS — G35 Multiple sclerosis: Secondary | ICD-10-CM | POA: Diagnosis not present

## 2023-10-23 NOTE — ED Provider Notes (Signed)
 [place] EMERGENCY DEPARTMENT AT [facility] Provider Note   [name]: [id number] Arrival date & time: 10/23/23  1845     Patient presents with: Urinary Catheter clogged   Melissa West is a 33 y.o. female.   HPI   This patient is a 33 year old female with multiple sclerosis, she has an indwelling suprapubic catheter.  She reports that this catheter was last changed when she was in the emergency department, she cannot remember when that was, she usually has it changed in the urology office.  It appears based on notes that she was seen approximately 3 weeks ago in the urology office for change.  She reports that last night the catheter stopped working, she has no abdominal pain nausea vomiting or fever, the bag is not filling up.  She has been eating and drinking.  Prior to Admission medications   Medication Sig Start Date End Date Taking? Authorizing Provider  acetaminophen  (TYLENOL ) 325 MG tablet Take 2 tablets (650 mg total) by mouth every 6 (six) hours as needed for mild pain (or Fever >/= 101). 05/20/21   Pearlean Manus, MD  acetic acid  0.25 % irrigation IRRIGATE WITH AS DIRECTED DAILY 03/22/23   [name], Belvie CROME, MD  amantadine  (SYMMETREL ) 100 MG capsule Take 1 capsule (100 mg total) by mouth 2 (two) times daily. 01/26/23   Sater, Charlie LABOR, MD  AMBULATORY NON FORMULARY MEDICATION 30 mLs by Intracatheter route daily as needed. Medication Name: Sodium Chloride  0.9% 11/12/21   [name], Belvie CROME, MD  AMBULATORY NON FORMULARY MEDICATION Medication Name: Bedside catheter bag 08/12/22   Watt Rush, MD  baclofen  (LIORESAL ) 10 MG tablet Take 1 tablet (10 mg total) by mouth 3 (three) times daily. 10/18/22   Sater, Charlie LABOR, MD  ciprofloxacin  (CIPRO ) 500 MG tablet Take 1 tablet (500 mg total) by mouth every 12 (twelve) hours. 05/18/23   Francis Ileana SAILOR, PA-C  KESIMPTA 20 MG/0.4ML SOAJ Inject 20 mg into the skin every 30 (thirty) days. 06/24/21   [provider]  meclizine   (ANTIVERT ) 25 MG tablet Take 1 tablet (25 mg total) by mouth 3 (three) times daily as needed for dizziness. 12/29/22   Zammit, Joseph, MD  metoCLOPramide  (REGLAN ) 10 MG tablet Take 1 tablet (10 mg total) by mouth every 6 (six) hours. 04/21/23   Yolande Lamar BROCKS, MD  Skin Protectants, Misc. (PERIGUARD) OINT Apply topically 05/26/23   Zarwolo, Gloria, FNP  Syringe, Disposable, (50-60CC SYRINGE) 60 ML MISC Flush 30-50cc of sterile water  into catheter daily as needed 01/15/22   [name], Belvie CROME, MD  Vitamin D , Ergocalciferol , (DRISDOL ) 1.25 MG (50000 UNIT) CAPS capsule Take 1 capsule (50,000 Units total) by mouth every 7 (seven) days. 06/25/23   Zarwolo, Gloria, FNP  Water  For Irrigation, Sterile (STERILE WATER  FOR IRRIGATION) Irrigate with 50 mLs as directed daily. Irrigate catheter daily with 50cc of sterile water . 01/15/22   [name], Belvie CROME, MD    Allergies: Patient has no known allergies.    Review of Systems  All other systems reviewed and are negative.   Updated Vital Signs BP 102/76   Pulse 86   Temp 97.7 F (36.5 C) (Oral)   Resp 18   Ht 1.575 m (5' 2)   Wt 50.8 kg   SpO2 100%   BMI 20.48 kg/m   Physical Exam Vitals and nursing note reviewed.  Constitutional:      General: She is not in acute distress.    Appearance: She is well-developed.  HENT:  Head: Normocephalic and atraumatic.     Mouth/Throat:     Pharynx: No oropharyngeal exudate.  Eyes:     General: No scleral icterus.       Right eye: No discharge.        Left eye: No discharge.     Conjunctiva/sclera: Conjunctivae normal.     Pupils: Pupils are equal, round, and reactive to light.  Neck:     Thyroid : No thyromegaly.     Vascular: No JVD.  Cardiovascular:     Rate and Rhythm: Normal rate and regular rhythm.     Heart sounds: Normal heart sounds. No murmur heard.    No friction rub. No gallop.  Pulmonary:     Effort: Pulmonary effort is normal. No respiratory distress.     Breath sounds: Normal  breath sounds. No wheezing or rales.  Abdominal:     General: Bowel sounds are normal. There is no distension.     Palpations: Abdomen is soft. There is no mass.     Tenderness: There is no abdominal tenderness.     Comments: Soft nontender abdomen, suprapubic catheter is well-placed in the appropriate position without drainage, catheter bag is virtually empty  Musculoskeletal:        General: No tenderness. Normal range of motion.     Cervical back: Normal range of motion and neck supple.     Right lower leg: No edema.     Left lower leg: No edema.  Lymphadenopathy:     Cervical: No cervical adenopathy.  Skin:    General: Skin is warm and dry.     Findings: No erythema or rash.  Neurological:     Mental Status: She is alert.     Coordination: Coordination normal.  Psychiatric:        Behavior: Behavior normal.     (all labs ordered are listed, but only abnormal results are displayed) Labs Reviewed - No data to display  EKG: None  Radiology: No results found.   BLADDER CATHETERIZATION  Date/Time: 10/23/2023 10:29 PM  Performed by: Cleotilde Rogue, MD Authorized by: Cleotilde Rogue, MD   Consent:    Consent obtained:  Verbal   Consent given by:  Patient   Risks, benefits, and alternatives were discussed: yes     Risks discussed:  False passage and incomplete procedure   Alternatives discussed:  No treatment Universal protocol:    Procedure explained and questions answered to patient or proxy's satisfaction: yes     Immediately prior to procedure, a time out was called: yes     Patient identity confirmed:  Verbally with patient Pre-procedure details:    Procedure purpose:  Therapeutic   Preparation: Patient was prepped and draped in usual sterile fashion   Anesthesia:    Anesthesia method:  None Procedure details:    Provider performed due to:  Nurse unable to complete   Catheter insertion:  Indwelling   Catheter type:  Foley   Catheter size:  10 Fr   Bladder  irrigation: yes     Number of attempts:  1   Urine characteristics:  Clear Post-procedure details:    Procedure completion:  Tolerated well, no immediate complications Comments:          Medications Ordered in the ED - No data to display  Medical Decision Making  Patient will need to be undressed, full evaluation, may need to replace the catheter, she is hemodynamically stable  Catheter replaced, patient stable Functioning, appropriately Irrigated prior to d/c     Final diagnoses:  Foley catheter problem, initial encounter [facility])    ED Discharge Orders     None          Cleotilde Rogue, MD 10/23/23 2230

## 2023-10-23 NOTE — ED Triage Notes (Signed)
 Pt c/o urinary catheter clogged since last night.

## 2023-10-23 NOTE — Discharge Instructions (Signed)
 Please drink plenty of clear liquids, return if the catheter is not working correctly.

## 2023-10-24 DIAGNOSIS — R531 Weakness: Secondary | ICD-10-CM | POA: Diagnosis not present

## 2023-10-24 DIAGNOSIS — Z743 Need for continuous supervision: Secondary | ICD-10-CM | POA: Diagnosis not present

## 2023-10-24 DIAGNOSIS — R262 Difficulty in walking, not elsewhere classified: Secondary | ICD-10-CM | POA: Diagnosis not present

## 2023-10-28 ENCOUNTER — Ambulatory Visit: Admitting: Urology

## 2023-10-28 ENCOUNTER — Encounter: Payer: Self-pay | Admitting: Urology

## 2023-10-28 VITALS — BP 100/68 | HR 118

## 2023-10-28 DIAGNOSIS — R339 Retention of urine, unspecified: Secondary | ICD-10-CM | POA: Diagnosis not present

## 2023-10-28 DIAGNOSIS — N319 Neuromuscular dysfunction of bladder, unspecified: Secondary | ICD-10-CM | POA: Diagnosis not present

## 2023-10-28 DIAGNOSIS — Z9359 Other cystostomy status: Secondary | ICD-10-CM

## 2023-10-28 NOTE — Progress Notes (Signed)
 Name: Melissa West DOB: Aug 13, 1990 MRN: [id number]  History of Present Illness: Melissa West is a 33 y.o. female who presents today for follow up visit at [facility].  Relevant History includes: 1. Neurogenic bladder with chronic urinary retention secondary to multiple sclerosis. - 06/09/2021: Urodynamic testing showed bladder capacity of 272 ml. She was unable to generate a detrusor pressure and urinate even with valsalva. She had multiple unstable detrusor contractions during filling. - Managed with SP tube.  At last visit with Dr. Sherrilee on 02/03/2022: Having issues with recurrent clogged catheter despite flushing the catheter with 10cc of normal saline daily. The plan was to start flushing with acetic acid  irrigation 30cc daily.    Recent history:  > 10/23/2023: Seen in ER for clogged SP tube, which was exchanged.   Today: She reports the SP tube is draining well. She reports that the urine appears dark in color; denies gross hematuria and reports that she has been drinking lots of water  every day.  She reports that she has a home health aid who is flushing her SP tube every day with the acetic acid  as prescribed.  She denies flank pain or abdominal pain. She denies fevers, nausea, or vomiting.  Medications: Current Outpatient Medications  Medication Sig Dispense Refill   acetaminophen  (TYLENOL ) 325 MG tablet Take 2 tablets (650 mg total) by mouth every 6 (six) hours as needed for mild pain (or Fever >/= 101). 12 tablet 0   acetic acid  0.25 % irrigation IRRIGATE WITH AS DIRECTED DAILY 1000 mL 11   amantadine  (SYMMETREL ) 100 MG capsule Take 1 capsule (100 mg total) by mouth 2 (two) times daily. 60 capsule 11   AMBULATORY NON FORMULARY MEDICATION 30 mLs by Intracatheter route daily as needed. Medication Name: Sodium Chloride  0.9% 900 mL 11   AMBULATORY NON FORMULARY MEDICATION Medication Name: Bedside catheter bag 2 Bag 12   baclofen  (LIORESAL ) 10 MG  tablet Take 1 tablet (10 mg total) by mouth 3 (three) times daily. 90 each 11   ciprofloxacin  (CIPRO ) 500 MG tablet Take 1 tablet (500 mg total) by mouth every 12 (twelve) hours. 14 tablet 0   KESIMPTA 20 MG/0.4ML SOAJ Inject 20 mg into the skin every 30 (thirty) days.     meclizine  (ANTIVERT ) 25 MG tablet Take 1 tablet (25 mg total) by mouth 3 (three) times daily as needed for dizziness. 30 tablet 0   metoCLOPramide  (REGLAN ) 10 MG tablet Take 1 tablet (10 mg total) by mouth every 6 (six) hours. 12 tablet 0   Skin Protectants, Misc. (PERIGUARD) OINT Apply topically 227 g 1   Syringe, Disposable, (50-60CC SYRINGE) 60 ML MISC Flush 30-50cc of sterile water  into catheter daily as needed 30 each 11   Vitamin D , Ergocalciferol , (DRISDOL ) 1.25 MG (50000 UNIT) CAPS capsule Take 1 capsule (50,000 Units total) by mouth every 7 (seven) days. 20 capsule 1   Water  For Irrigation, Sterile (STERILE WATER  FOR IRRIGATION) Irrigate with 50 mLs as directed daily. Irrigate catheter daily with 50cc of sterile water . 1000 mL 15   No current facility-administered medications for this visit.    Allergies: No Known Allergies  Past Medical History:  Diagnosis Date   MS (multiple sclerosis) (HCC)    No pertinent past medical history    Past Surgical History:  Procedure Laterality Date   CESAREAN SECTION  08/09/2011   Procedure: CESAREAN SECTION;  Surgeon: Burnard VEAR Pate, MD;  Location: [facility];  Service: Gynecology;  Laterality: N/A;  IR CATHETER TUBE CHANGE  07/22/2021   IR CATHETER TUBE CHANGE  09/10/2021   Family History  Problem Relation Age of Onset   Diabetes Maternal Grandmother    Hypertension Maternal Grandmother    Kidney disease Maternal Grandmother    Social History   Socioeconomic History   Marital status: Single    Spouse name: Not on file   Number of children: Not on file   Years of education: Not on file   Highest education level: Not on file  Occupational History   Not on file   Tobacco Use   Smoking status: Never   Smokeless tobacco: Never  Vaping Use   Vaping status: Never Used  Substance and Sexual Activity   Alcohol  use: No   Drug use: No   Sexual activity: Yes    Birth control/protection: None  Other Topics Concern   Not on file  Social History Narrative   Left handed    Lives with kid   Caffeine use: none   Social Drivers of Corporate investment banker Strain: Not on file  Food Insecurity: No Food Insecurity (10/09/2022)   Hunger Vital Sign    Worried About Running Out of Food in the Last Year: Never true    Ran Out of Food in the Last Year: Never true  Transportation Needs: No Transportation Needs (10/09/2022)   PRAPARE - Administrator, Civil Service (Medical): No    Lack of Transportation (Non-Medical): No  Physical Activity: Not on file  Stress: Not on file  Social Connections: Not on file  Intimate Partner Violence: Not At Risk (10/09/2022)   Humiliation, Afraid, Rape, and Kick questionnaire    Fear of Current or Ex-Partner: No    Emotionally Abused: No    Physically Abused: No    Sexually Abused: No    Review of Systems Constitutional: Patient denies any unintentional weight loss or change in strength lntegumentary: Patient denies any rashes or pruritus Cardiovascular: Patient denies chest pain or syncope Respiratory: Patient denies shortness of breath Gastrointestinal: Patient denies nausea, vomiting, constipation, or diarrhea  Musculoskeletal: Patient denies muscle cramps or weakness Neurologic: Patient denies convulsions or seizures Allergic/Immunologic: Patient denies recent allergic reaction(s) Hematologic/Lymphatic: Patient denies bleeding tendencies Endocrine: Patient denies heat/cold intolerance  GU: As per HPI.  OBJECTIVE Vitals:   10/28/23 1329 10/28/23 1336  BP: 100/68 100/68  Pulse: (!) 118 (!) 118   There is no height or weight on file to calculate BMI.  Physical Examination Constitutional: No  obvious distress; patient is non-toxic appearing  Cardiovascular: No visible lower extremity edema.  Respiratory: The patient does not have audible wheezing/stridor; respirations do not appear labored  Gastrointestinal: Abdomen non-distended Musculoskeletal: Normal ROM of UEs  Skin: No obvious rashes/open sores  Neurologic: CN 2-12 grossly intact Psychiatric: Answered questions appropriately with normal affect  Hematologic/Lymphatic/Immunologic: No obvious bruises or sites of spontaneous bleeding  GU: 22 fr SP tube in place; draining well    ASSESSMENT Neurogenic bladder  Chronic retention of urine  Suprapubic catheter (HCC)  She is doing well today; no acute findings. Advised to continue having home health aid flush the SP tube with acetic acid  irrigation 30cc daily and to continue Urology nurse visit monthly for SP tube exchange. Patient verbalized understanding of and agreement with current plan. All questions were answered.  PLAN Advised the following: 1. Flush the SP tube with acetic acid  irrigation 30cc daily. 2. Return for continue monthly nurse visits for SP tube exchange.  No orders of the defined types were placed in this encounter.  Total time spent caring for the patient today was over 30 minutes. This includes time spent on the date of the visit reviewing the patient's chart before the visit, time spent during the visit, and time spent after the visit on documentation. Over 50% of that time was spent in face-to-face time with this patient for direct counseling. E&M based on time and complexity of medical decision making.  It has been explained that the patient is to follow regularly with their PCP in addition to all other providers involved in their care and to follow instructions provided by these respective offices. Patient advised to contact urology clinic if any urologic-pertaining questions, concerns, new symptoms or problems arise in the interim period.  There are  no Patient Instructions on file for this visit.  Electronically signed by:  Lauraine JAYSON Oz, FNP   10/28/23    1:45 PM

## 2023-10-31 ENCOUNTER — Ambulatory Visit: Payer: Medicaid Other | Admitting: Family Medicine

## 2023-11-01 ENCOUNTER — Ambulatory Visit: Payer: Self-pay | Admitting: Family Medicine

## 2023-11-02 ENCOUNTER — Encounter (HOSPITAL_COMMUNITY): Payer: Self-pay

## 2023-11-02 ENCOUNTER — Other Ambulatory Visit: Payer: Self-pay

## 2023-11-02 ENCOUNTER — Emergency Department (HOSPITAL_COMMUNITY)
Admission: EM | Admit: 2023-11-02 | Discharge: 2023-11-03 | Disposition: A | Discharge status: Home / Self Care | Attending: Emergency Medicine | Admitting: Emergency Medicine

## 2023-11-02 ENCOUNTER — Telehealth: Payer: Self-pay

## 2023-11-02 DIAGNOSIS — T83098A Other mechanical complication of other indwelling urethral catheter, initial encounter: Secondary | ICD-10-CM | POA: Diagnosis not present

## 2023-11-02 DIAGNOSIS — Y846 Urinary catheterization as the cause of abnormal reaction of the patient, or of later complication, without mention of misadventure at the time of the procedure: Secondary | ICD-10-CM | POA: Diagnosis not present

## 2023-11-02 DIAGNOSIS — R531 Weakness: Secondary | ICD-10-CM | POA: Diagnosis not present

## 2023-11-02 DIAGNOSIS — G35 Multiple sclerosis: Secondary | ICD-10-CM | POA: Diagnosis not present

## 2023-11-02 DIAGNOSIS — T83091A Other mechanical complication of indwelling urethral catheter, initial encounter: Secondary | ICD-10-CM | POA: Diagnosis not present

## 2023-11-02 DIAGNOSIS — Z743 Need for continuous supervision: Secondary | ICD-10-CM | POA: Diagnosis not present

## 2023-11-02 NOTE — Telephone Encounter (Signed)
 Patient has concerns with black urine. Does not have any symptoms other than black urine.   Needing to discuss.  Call:  [phone]

## 2023-11-02 NOTE — ED Provider Notes (Signed)
 [place] EMERGENCY DEPARTMENT AT [facility] Provider Note   [name]: [id number] Arrival date & time: 11/02/23  1629     Patient presents with: Urinary Retention   Melissa West is a 33 y.o. female with a history of MS, wheelchair-bound with neurogenic bladder with a chronic suprapubic catheter which frequently becomes occluded, comes here for changes when cannot be seen by urology.  Presenting tonight as she noted there has been no urine in her urine bag and suspects it is occluded.  She states she last saw urine in the bag 3 days ago, she wears a depends at baseline and it is significantly wet here suggesting the urine has been leaking around the tubing.  She was last here 2 weeks ago for the same complaint at which time the catheter was replaced.  She is under the care of Dr. Sherrilee and typically has her catheters replaced once per month in his office.  She denies fevers or chills, nausea, vomiting or other complaint.   The history is provided by the patient.       Prior to Admission medications   Medication Sig Start Date End Date Taking? Authorizing Provider  acetaminophen  (TYLENOL ) 325 MG tablet Take 2 tablets (650 mg total) by mouth every 6 (six) hours as needed for mild pain (or Fever >/= 101). 05/20/21   Pearlean Manus, MD  acetic acid  0.25 % irrigation IRRIGATE WITH AS DIRECTED DAILY 03/22/23   [name], Belvie CROME, MD  amantadine  (SYMMETREL ) 100 MG capsule Take 1 capsule (100 mg total) by mouth 2 (two) times daily. 01/26/23   Sater, Charlie LABOR, MD  AMBULATORY NON FORMULARY MEDICATION 30 mLs by Intracatheter route daily as needed. Medication Name: Sodium Chloride  0.9% 11/12/21   [name], Belvie CROME, MD  AMBULATORY NON FORMULARY MEDICATION Medication Name: Bedside catheter bag 08/12/22   Watt Rush, MD  baclofen  (LIORESAL ) 10 MG tablet Take 1 tablet (10 mg total) by mouth 3 (three) times daily. 10/18/22   Sater, Charlie LABOR, MD  ciprofloxacin  (CIPRO ) 500 MG tablet Take 1  tablet (500 mg total) by mouth every 12 (twelve) hours. 05/18/23   Francis Ileana SAILOR, PA-C  KESIMPTA 20 MG/0.4ML SOAJ Inject 20 mg into the skin every 30 (thirty) days. 06/24/21   [provider]  meclizine  (ANTIVERT ) 25 MG tablet Take 1 tablet (25 mg total) by mouth 3 (three) times daily as needed for dizziness. 12/29/22   Zammit, Joseph, MD  metoCLOPramide  (REGLAN ) 10 MG tablet Take 1 tablet (10 mg total) by mouth every 6 (six) hours. 04/21/23   Yolande Lamar BROCKS, MD  Skin Protectants, Misc. (PERIGUARD) OINT Apply topically 05/26/23   Zarwolo, Gloria, FNP  Syringe, Disposable, (50-60CC SYRINGE) 60 ML MISC Flush 30-50cc of sterile water  into catheter daily as needed 01/15/22   [name], Belvie CROME, MD  Vitamin D , Ergocalciferol , (DRISDOL ) 1.25 MG (50000 UNIT) CAPS capsule Take 1 capsule (50,000 Units total) by mouth every 7 (seven) days. 06/25/23   Zarwolo, Gloria, FNP  Water  For Irrigation, Sterile (STERILE WATER  FOR IRRIGATION) Irrigate with 50 mLs as directed daily. Irrigate catheter daily with 50cc of sterile water . 01/15/22   [name], Belvie CROME, MD    Allergies: Patient has no known allergies.    Review of Systems  Constitutional:  Negative for chills and fever.  HENT:  Negative for congestion.   Eyes: Negative.   Respiratory:  Negative for chest tightness and shortness of breath.   Cardiovascular:  Negative for chest pain.  Gastrointestinal:  Negative  for abdominal pain and nausea.  Genitourinary:  Positive for difficulty urinating.  Musculoskeletal:  Negative for arthralgias, joint swelling and neck pain.  Skin: Negative.  Negative for rash and wound.  Neurological:  Negative for dizziness, light-headedness, numbness and headaches.  Psychiatric/Behavioral: Negative.      Updated Vital Signs BP 102/74   Pulse 86   Temp 98.3 F (36.8 C)   Resp 17   Ht 5' 2 (1.575 m)   Wt 50.8 kg   LMP 10/30/2023 (Approximate)   SpO2 100%   BMI 20.48 kg/m   Physical Exam Vitals and nursing  note reviewed.  Constitutional:      Appearance: She is well-developed.  HENT:     Head: Normocephalic and atraumatic.  Eyes:     Conjunctiva/sclera: Conjunctivae normal.  Cardiovascular:     Rate and Rhythm: Normal rate and regular rhythm.     Heart sounds: Normal heart sounds.  Pulmonary:     Effort: Pulmonary effort is normal.  Abdominal:     General: Bowel sounds are normal.     Palpations: Abdomen is soft.     Tenderness: There is no abdominal tenderness. There is no guarding.     Comments: Suprapubic catheter in place.  Musculoskeletal:        General: Normal range of motion.     Cervical back: Normal range of motion.  Skin:    General: Skin is warm and dry.  Neurological:     Mental Status: She is alert.     (all labs ordered are listed, but only abnormal results are displayed) Labs Reviewed - No data to display  EKG: None  Radiology: No results found.   BLADDER CATHETERIZATION  Date/Time: 11/02/2023 10:55 PM  Performed by: Birdena Clarity, PA-C Authorized by: Birdena Clarity, PA-C   Consent:    Consent obtained:  Verbal   Consent given by:  Patient   Risks, benefits, and alternatives were discussed: yes     Risks discussed:  False passage, pain and infection   Alternatives discussed:  No treatment Universal protocol:    Procedure explained and questions answered to patient or proxy's satisfaction: yes     Immediately prior to procedure, a time out was called: yes     Patient identity confirmed:  Verbally with patient Pre-procedure details:    Procedure purpose:  Therapeutic   Preparation: Patient was prepped and draped in usual sterile fashion   Anesthesia:    Anesthesia method:  None Procedure details:    Provider performed due to:  Complicated insertion (suprapubic)   Catheter insertion:  Indwelling   Catheter type:  Foley   Catheter size:  22 Fr   Bladder irrigation: no     Number of attempts:  1   Urine characteristics:  Yellow and  clear Post-procedure details:    Procedure completion:  Tolerated      Medications Ordered in the ED - No data to display                                  Medical Decision Making Patient presenting for replacement of her suprapubic catheter, initially we attempted to flush without success.  Catheter was placed without complication.  She was advised follow-up with Dr. Sherrilee for follow-up care.        Final diagnoses:  Obstruction of Foley catheter, initial encounter [facility])    ED Discharge Orders     None  Birdena Clarity, PA-C 11/02/23 2258    Freddi Hamilton, MD 11/03/23 [phone]

## 2023-11-02 NOTE — Discharge Instructions (Signed)
 As discussed, there were a lot of tiny sand like crystal sediment on your old catheter that we removed which I suspect may be the reason your catheters continue to stop up so quickly.  I suggest talking to Dr Sherrilee about this.

## 2023-11-02 NOTE — Telephone Encounter (Signed)
 Pt state's that she has an aid and her cath is flushed everyday and now there is no urine return in the bag.. Pt state's that she is going to the ER due to no urine return. Please clarify - she was seen here on 10/28/23 for SP tube exchange after being in the ER on 10/23/23. Is she saying she went to the ER somewhere since 10/28/23? If so I'm not seeing that in her EMR. If home health flushing her SP tube as directed?

## 2023-11-02 NOTE — ED Notes (Signed)
 Catheter irrigation unsuccessful. EDP notified.

## 2023-11-02 NOTE — ED Notes (Signed)
 Assisted provider in exchange of suprapubic catheter.

## 2023-11-02 NOTE — ED Triage Notes (Signed)
 Pt arrived via [facility] form home for concerns of recurrent urinary retention. Pt seen here recently and had 22 Fr Suprapubic catheter replaced. Pt reports she has been unable to pass urine for past 3 days. Pt denies pain at this time.

## 2023-11-02 NOTE — Telephone Encounter (Signed)
 Return pt's called. Patient state's her urine has been black since leaving the hospital and she state's she is drink water  everyday all day.  Dysuria  Patient called with c/o dysuria x 2-3 days days.  Pain: no pain   Severity:no  Associated Signs and Symptoms:  Fever: noTemp.0 Chills: no Hematuria: no Urgency: no Frequency: no Hesitancy:no Incontinence: no Nausea: no Vomiting: no  Urologic History:  Any Recent Urologic Surgeries or Procedures:no Recurrent UTI's:no Cystitis: no  Prostatitis:no Kidney or Bladder Stones: no Plan: Walk-in Clinic: no Appointment w/Physician: [no Lab visit scheduled for urine drop off: No Advice given:  Do you take on daily medications for UTI suppression No

## 2023-11-02 NOTE — ED Notes (Signed)
 Pt reports her home health aide irrigates her catheter daily, but reports today her home health aide was unable to irrigate the foley due to possible clog/blockage.

## 2023-11-03 ENCOUNTER — Telehealth: Payer: Self-pay | Admitting: Urology

## 2023-11-03 ENCOUNTER — Ambulatory Visit: Admitting: Family Medicine

## 2023-11-03 NOTE — Telephone Encounter (Signed)
 Patient was seen in ER 11/02/23 she wants to know if she needs an earlier appointment to follow up with Sarah to address her issue?

## 2023-11-03 NOTE — Telephone Encounter (Signed)
 Her home health team is supposed to be flushing her SP tube daily with acetic acid  irrigation 30cc as per Dr. Sherrilee for that reason. Pt state's her home team is irrigation her cath daily and sometime twice daily. Pt is aware a message will be sent to Sarah on advisement.

## 2023-11-03 NOTE — Telephone Encounter (Signed)
 Pt was made aware and offer an earlier appointment, pt state's how will she know if it is necessary if she need an earlier appointment and she has not been check.. Not necessarily but can if she wants Pt state's she will think about seeing about getting an earlier appointment and call back when she ready to scheduled appointment.

## 2023-11-03 NOTE — Telephone Encounter (Signed)
 Patient called she was in ER last night her cath was clogged with what looked like sand, the hospital said that you could prescribe her something to stop her urine from getting those little particles in it?

## 2023-11-04 ENCOUNTER — Ambulatory Visit: Attending: Family Medicine | Admitting: Physical Therapy

## 2023-11-04 DIAGNOSIS — M6281 Muscle weakness (generalized): Secondary | ICD-10-CM | POA: Diagnosis not present

## 2023-11-04 DIAGNOSIS — R262 Difficulty in walking, not elsewhere classified: Secondary | ICD-10-CM | POA: Insufficient documentation

## 2023-11-04 DIAGNOSIS — R2681 Unsteadiness on feet: Secondary | ICD-10-CM | POA: Diagnosis not present

## 2023-11-04 DIAGNOSIS — R278 Other lack of coordination: Secondary | ICD-10-CM | POA: Diagnosis not present

## 2023-11-04 DIAGNOSIS — G35 Multiple sclerosis: Secondary | ICD-10-CM | POA: Insufficient documentation

## 2023-11-04 DIAGNOSIS — R2689 Other abnormalities of gait and mobility: Secondary | ICD-10-CM | POA: Diagnosis not present

## 2023-11-04 NOTE — Therapy (Addendum)
 OUTPATIENT PHYSICAL THERAPY WHEELCHAIR EVALUATION   Patient Name: Melissa West MRN: [id number] DOB:[birth year], 33 y.o., female Today's Date: 11/04/2023  END OF SESSION:  PT End of Session - 11/04/23 1101     Visit Number 1    Number of Visits 1    Date for PT Re-Evaluation 11/04/23    Authorization Type [employer] Medicare - Medicaid [place] [employer]    PT Start Time 1100    PT Stop Time 1205    PT Time Calculation (min) 65 min    Equipment Utilized During Treatment Gait belt    Activity Tolerance Patient tolerated treatment well    Behavior During Therapy WFL for tasks assessed/performed          Past Medical History:  Diagnosis Date   MS (multiple sclerosis) (HCC)    No pertinent past medical history    Past Surgical History:  Procedure Laterality Date   CESAREAN SECTION  08/09/2011   Procedure: CESAREAN SECTION;  Surgeon: Burnard VEAR Pate, MD;  Location: [facility];  Service: Gynecology;  Laterality: N/A;   IR CATHETER TUBE CHANGE  07/22/2021   IR CATHETER TUBE CHANGE  09/10/2021   Patient Active Problem List   Diagnosis Date Noted   Neurogenic bladder 10/28/2023   Chronic retention of urine 10/28/2023   Suprapubic catheter (HCC) 10/28/2023   STD (female) 12/31/2022   Hypokalemia 10/09/2022   Lower extremity weakness 10/08/2022   Chronic hypotension 11/18/2021   Osteomyelitis (HCC) 11/04/2021   Chronic anemia 05/20/2021   Hypotension 05/15/2021    Class: Acute   Leukocytosis 03/08/2021   Pressure injury of skin 03/08/2021   Chronic fatigue syndrome 02/09/2021   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   Ataxia 11/29/2018   Nystagmus 11/29/2018   MS (multiple sclerosis) (HCC)     PCP: Zarwolo, Gloria, FNP  REFERRING PROVIDER: Vear Charlie LABOR, MD  THERAPY DIAG:  Difficulty in walking, not elsewhere classified  Muscle weakness (generalized)  Multiple sclerosis (HCC)  Other abnormalities of gait and mobility  Unsteadiness on feet  Other lack of  coordination  Rationale for Evaluation and Treatment Habilitation  SUBJECTIVE:                                                                                                                                                                                           SUBJECTIVE STATEMENT: Pt presents for a new wheelchair evaluation. Pt arrives in a K0004 manual wheelchair that her family has purchased for her. She reports that she lives with her family but has in-home caregivers 7 days/week. She sleeps in a regular bed but uses a hoyer lift for all transfers. Pt  is wearing glasses but otherwise reports no visual impairments.   PRECAUTIONS: Fall   RED FLAGS: None  WEIGHT BEARING RESTRICTIONS No    OCCUPATION: on disability  PLOF:  Needs assistance with ADLs, Needs assistance with transfers, and dependent at her current level  PATIENT GOALS: to obtain power wheeled mobility to allow for safe and independent pressure relief, safe and independent mobility, and ability to complete MRADLs in a safe and timely manner           MEDICAL HISTORY:  Primary diagnosis onset: 2009     Medical Diagnosis with ICD-10 code: MS (multiple sclerosis), G35   [] Progressive disease  Relevant future surgeries: N/A    Height: 5'2 Weight: 133 lbs Explain recent changes or trends in weight:  N/A    History:  Past Medical History:  Diagnosis Date   MS (multiple sclerosis) (HCC)    No pertinent past medical history        Cardio Status:  Functional Limitations: impaired due to mobility impairments  [] Intact  [x]  Impaired      Respiratory Status:  Functional Limitations:   [x] Intact  [] Impaired   [] SOB [] COPD [] O2 Dependent ______LPM  [] Ventilator Dependent  Resp equip:                                                     Objective Measure(s):   Orthotics:   [] Amputee:                                                             [] Prosthesis:        HOME ENVIRONMENT:  [x] House [] Condo/town home  [] Apartment [] Asst living [] LTCF         [x] Own  [] Rent   [] Lives alone [x] Lives with others -        family                     Hours without assistance: 0, paid caregivers 7 days/week  [x] Home is accessible to patient, ramped entrance to home, single story home                                 Storage of wheelchair:  [x] In home   [] Other Comments:        COMMUNITY :  TRANSPORTATION:  [] Car [] Environmental manager [] Adapted w/c Lift []  Ambulance [] Other:                     [x] Sits in wheelchair during transport   Where is w/c stored during transport?  [x] [employer]  []  [employer]  r   [] Self-Driver       Drive while in  Biomedical scientist [] yes [x] no   Employment and/or school: N/A Specific requirements pertaining to mobility        Other:  COMMUNICATION:  Verbal Communication  [x] WFL [] receptive [x] WFL [] expressive [] Understandable  [] Difficult to understand  [] non-communicative  Primary Language:________English______ 2nd:_____________  Communication provided by:[x] Patient [] Family [] Caregiver [] Translator   [] Uses an augmentative communication device     Manufacturer/Model :  MOBILITY/BALANCE:  Sitting Balance  Standing Balance  Transfers  Ambulation   [] WFL      [] WFL  [] Independent  []  Independent   [] Uses UE for balance in sitting Comments:  [] Uses UE/device for stability Comments:  []  Min assist  []  Ambulates independently with       device:___________________      []  Mod assist  []  Able to ambulate ______ feet        safely/functionally/independently   []  Min assist  []  Min assist  []  Max assist  []  Non-functional ambulator         History/High risk of falls   []  Mod assist  []  Mod assist  [x]  Dependent  [x]  Unable to ambulate   [x]  Max  assist  []  Max assist  Transfer method:[] 1 person [] 2 person [] sliding board [] squat pivot [] stand pivot [x] mechanical patient lift  [] other:   []  Unable  [x]  Unable    Fall  History: # of falls in the past 6 months? 0 # of "near" falls in the past 6 months? daily    CURRENT SEATING / MOBILITY:  Current Mobility Device: [] None [] Cane/Walker [x] Manual [] Dependent [] Dependent w/ Tilt rScooter  [] Power (type of control):   Manufacturer:  Model:  Serial #:   Size:  Color:  Age:   Purchased by whom: patient/family  Current condition of mobility base:  shows age-related wear and tear, armrests of wheelchair are very difficult to remove  Current seating system:          K0004 off the shelf manual wheelchair                                                             Age of seating system:  indeterminate  Describe posture in present seating system: Pt exhibits a kyphotic posture with posterior pelvic tilt which makes her slide forwards out of the wheelchair. She also exhibits bilateral knee contractures and severe spasticity in her BLE which makes her legs cross over each other at the heels, has difficulty keeping her feet on the foot plates of the leg rests of the manual wheelchair and her feet rest on the floor, they are dragged along the floor when she is pushed in the wheelchair.    Is the current mobility meeting medical necessity?:  [] Yes [x] No Describe: Due to her postural abnormalities as noted previously and as indicated below she is unable to maintain upright sitting posture in her current off-the-shelf manual wheelchair and tends to slide forwards out of the chair. She does not currently have a cushion for her chair either and she reports she has tried using a cushion in the past and it made her more likely to slide out of the chair. Additionally, the chair is not appropriately sized for the patient (too large) so she has room to fall laterally due to poor trunk control and sitting balance which further exacerbates her postural abnormalities and is leading to muscle imbalances and contractures. She is unable to safely place her feet on the foot plates of the chair due to  increased spasticity in her LE and therefore tends to drag her feet along the floor when being pushed in her chair. Due to UE weakness and motor impairments related to her MS she is unable to safely and  efficiently propel herself in a manual wheelchair. This chair is not meeting her needs for safe and independent mobility.                                    Ability to complete Mobility-Related Activities of Daily Living (MRADL's) with Current Mobility Device:   Move room to room  [] Independent  [] Min [] Mod [] Max assist  [x] Unable  Comments: Unable to independently propel herself in a manual wheelchair  Meal prep  [] Independent  [] Min [] Mod [] Max assist  [x] Unable    Feeding  [x] Independent  [] Min [] Mod [] Max assist  [] Unable    Bathing  [] Independent  [] Min [] Mod [x] Max assist  [] Unable    Grooming  [] Independent  [] Min [] Mod [x] Max assist  [] Unable    UE dressing  [] Independent  [] Min [] Mod [x] Max assist  [] Unable    LE dressing  [] Independent   [] Min [] Mod [x] Max assist  [] Unable    Toileting  [] Independent  [] Min [] Mod [x] Max assist  [] Unable    Bowel Mgt: []  Continent [x]  Incontinent [x]  Accidents [x]  Diapers []  Colostomy []  Bowel Program:  Bladder Mgt: []  Continent []  Incontinent []  Accidents []  Diapers []  Urinal []  Intermittent Cath []  Indwelling Cath [x]  Supra-pubic Cath     Current Mobility Equipment Trialed/ Ruled Out:    Does not meet mobility needs due to:    Mark all boxes that indicate inability to use the specific equipment listed     Meets needs for safe  independent functional  ambulation  / mobility    Risk of  Falling or History of Falls    Enviromental limitations      Cognition    Safety concerns with  physical ability    Decreased / limitations endurance  & strength     Decreased / limitations  motor skills  & coordination    Pain    Pace /  Speed    Cardiac and/or  respiratory condition    Contra - indicated by diagnosis   Cane/Crutches  []   [x]   []    []   [x]   [x]   [x]   []   []   []   [x]    Walker / Rollator  []  NA   []   [x]   []   []   [x]   [x]   [x]   []   []   []   [x]     Manual Wheelchair [id number]:  []  NA  []   []   []   []   [x]   [x]   [x]   [x]   [x]   []   []    Manual W/C (K0005) with power assist  []  NA  []   []   []   []   [x]   [x]   [x]   [x]   [x]   []   []    Scooter  []  NA  []   [x]   []   []   [x]   [x]   [x]   []   []   []   []    Power Wheelchair: standard joystick  []  NA  [x]   []   []   []   []   []   []   []   []   []   []    Power Wheelchair: alternative controls  [x]  NA  []   []   []   []   []   []   []   []   []   []   []    Summary:  The least costly alternative for independent functional mobility was found to be:    []  Crutch/Cane  []  Walker []  Manual w/c  []  Manual w/c  with power assist   []  Scooter   [x]  Power w/c std joystick   []  Power w/c alternative control        []  Requires dependent care mobility device   Cabin crew for [employer] skills are adequate for safe mobility equipment operation  [x]   Yes []   No  Patient is willing and motivated to use recommended mobility equipment  [x]   Yes []   No       []  Patient is unable to safely operate mobility equipment independently and requires dependent care equipment Comments:           SENSATION and SKIN ISSUES:  Sensation []  Intact  [x]  Impaired []  Absent []  Hyposensate []  Hypersensate  []  Defensiveness  Location(s) of impairment: impaired in BLE   Pressure Relief Method(s):  []  Lean side to side to offload (without risk of falling)  []   W/C push up (4+ times/hour for 15+ seconds) []  Stand up (without risk of falling)    []  Other: (Describe): Effective pressure relief method(s) above can be performed consistently throughout the day: [] Yes  [x]  No If not, Why?: Patient is unable to perform an adequate wheelchair push-up to perform pressure relief due to UE weakness. She exhibits impaired sitting balance and is unable to safely lean side to side in order to offload pressure.  Finally, she is unable to stand up due to LE weakness and contractures as well as impaired core control and therefore is unable to perform pressure relief in this manner.  Skin Integrity Risk:       []  Low risk           []  Moderate risk            [x]  High risk  If high risk, explain: Due to her inability to perform pressure relief methods safely or independently throughout the day she is at a high risk for skin breakdown. Additionally, she has bowel incontinence which puts her at further risk for skin breakdown as she is not able to transfer herself independently and is unable to perform pericare independently.  Skin Issues/Skin Integrity  Current skin Issues  []  Yes [x]  No []  Intact  []   Red area   []   Open area  []  Scar tissue  [x]  At risk from prolonged sitting  Where: sacrum History of Skin Issues  []  Yes [x]  No Where : When: Stage: Hx of skin flap surgeries  []  Yes [x]  No Where:  When:  Pain: []  Yes [x]  No   Pain Location(s):  Intensity scale: (0-10) : How does pain interfere with mobility and/or MRADLs? -         MAT EVALUATION:  Neuro-Muscular Status: (Tone, Reflexive, Responses, etc.)     []   Intact   [x]  Spasticity: in trunk and in BLE []  Hypotonicity  []  Fluctuating  []  Muscle Spasms  [x]  Poor Righting Reactions/Poor Equilibrium Reactions  []  Primal Reflex(s):    Comments: Pt at MAS level 3-4 in her BLE.           COMMENTS:    POSTURE:     Comments:  Pelvis Anterior/Posterior:  []  Neutral   [x]  Posterior  []  Anterior  [x]  Fixed - No movement []  Tendency away from neutral []  Flexible []  Self-correction []  External correction Obliquity (viewed from front)  [x]  WFL []  R Obliquity []  L Obliquity  []  Fixed - No movement []  Tendency away from neutral []  Flexible []  Self-correction []  External correction Rotation  [  x] [facility] []  R anterior []  L anterior  []  Fixed - No movement []  Tendency away from neutral []  Flexible []  Self-correction []   External correction Tonal Influence Pelvis:  []  Normal []  Flaccid []  Low tone [x]  Spasticity []  Dystonia []  Pelvis thrust []  Other:    Trunk Anterior/Posterior:  []  WFL [x]  Thoracic kyphosis []  Lumbar lordosis  [x]  Fixed - No movement []  Tendency away from neutral []  Flexible []  Self-correction []  External correction  [x]  WFL []  Convex to left  []  Convex to right []  S-curve   []  C-curve []  Multiple curves []  Tendency away from neutral []  Flexible []  Self-correction []  External correction Rotation of shoulders and upper trunk:  []  Neutral []  Left-anterior [x]  Right- anterior []  Fixed- no movement []  Tendency away from neutral []  Flexible []  Self correction []  External correction Tonal influence Trunk:  []  Normal []  Flaccid []  Low tone [x]  Spasticity []  Dystonia []  Other:   Head & Neck  [x]  Functional []  Flexed    []  Extended []  Rotated right  []  Rotated left []  Laterally flexed right []  Laterally flexed left []  Cervical hyperextension   [x]  Good head control []  Adequate head control []  Limited head control []  Absent head control Describe tone/movement of head and neck: [facility]     Lower Extremity Measurements: LE ROM:  Passive ROM Right 11/04/2023 Left 11/04/2023  Hip flexion Flexion contracture Flexion contracture  Hip extension    Hip abduction    Hip adduction    Knee flexion    Knee extension Flexion contraction, unable to passively get to neutral Flexion contraction, unable to passively get to neutral  Ankle dorsiflexion PF contracture PF contracture  Ankle plantarflexion     (Blank rows = not tested)  LE MMT:   Trace movements of LE but extremely limited due to spasticity range from 3-4 on Modified Ashworth Scale   MMT Right 11/04/2023 Left 11/04/2023  Hip flexion    Hip extension    Hip abduction    Hip adduction    Knee flexion    Knee extension    Ankle dorsiflexion    Ankle plantarflexion     (Blank rows = not tested)  Hip  positions:  [x]  Neutral   []  Abducted   []  Adducted  []  Subluxed   []  Dislocated   []  Fixed   []  Tendency away from neutral []  Flexible []  Self-correction []  External correction   Hip Windswept:[]  Neutral  [x]  Right    []  Left  []  Subluxed   []  Dislocated   []  Fixed   [x]  Tendency away from neutral []  Flexible []  Self-correction []  External correction  LE Tone: []  Normal []  Low tone [x]  Spasticity []  Flaccid []  Dystonia []  Rocks/Extends at hip []  Thrust into knee extension []  Pushes legs downward into footrest  Foot positioning: ROM Concerns: Dorsiflexed: []  Right   []  Left Plantar flexed: [x]  Right    [x]  Left Inversion: []  Right    []  Left Eversion: []  Right    []  Left  LE Edema: []  1+ (Barely detectable impression when finger is pressed into skin) []  2+ (slight indentation. 15 seconds to rebound) []  3+ (deeper indentation. 30 seconds to rebound) []  4+ (>30 seconds to rebound)  UE Measurements:  UPPER EXTREMITY ROM:   Active ROM Right 11/04/2023 Left 11/04/2023  Shoulder flexion Limited to about 100 Limited to about 100  Shoulder abduction Limited to about 90 Limited to about 90  Shoulder adduction    Elbow flexion  Elbow extension    Wrist flexion    Wrist extension    (Blank rows = not tested)  UPPER EXTREMITY MMT:  MMT Right 11/04/2023 Left 11/04/2023  Shoulder flexion 3 4  Shoulder abduction 3 4  Shoulder adduction    Elbow flexion 4 4  Elbow extension 4 4  Wrist flexion    Wrist extension    Pinch strength    Grip strength Slightly decreased Slightly decreased  (Blank rows = not tested)  Shoulder Posture:  Right Tendency towards Left  []   Functional []    [x]   Elevation [x]    []   Depression []    [x]   Protraction [x]    []   Retraction []    []   Internal rotation []    []   External rotation []    []   Subluxed []     UE Tone: []  Normal []  Flaccid []  Low tone [x]  Spasticity  []  Dystonia []  Other:   UE Edema: []  1+ (Barely detectable  impression when finger is pressed into skin) []  2+ (slight indentation. 15 seconds to rebound) []  3+ (deeper indentation. 30 seconds to rebound) []  4+ (>30 seconds to rebound)  Wrist/Hand: Handedness: []  Right   [x]  Left   []  NA: Comments:  Right  Left  []   WNL []    []   Limitations []    []   Contractures []    []   Fisting []    []   Tremors []    [x]   Weak grasp [x]    []   Poor dexterity []    []   Hand movement non functional []    []   Paralysis []         MOBILITY BASE RECOMMENDATIONS and JUSTIFICATION:  MOBILITY BASE  JUSTIFICATION   Manufacturer:   Quantum Model:                  Stretto Color:  Seat Width:  14 Seat Depth: 18    []  Manual mobility base (continue below)   []  Scooter/POV  [x]  Power mobility base   Number of hours per day spent in above selected mobility base: 8-10 hours  Typical daily mobility base use Schedule: Patient will utilize her power wheelchair to perform all of her functional mobility in the home. It will allow her to travel between rooms of her home safely and efficiently in order to complete all of her MRADLs. Pt is dependent for transfers with use of a hoyer lift and having a power wheelchair will decrease the burden on her caregivers with these transfers. Due to impaired UE strength and ROM she is also unable to safely and efficiently propel herself in a manual wheelchair.    [x]  is not a safe, functional ambulator  []  limitation prevents from completing a MRADL(s) within a reasonable time frame    []  limitation places at high risk of morbidity or mortality secondary to  the attempts to perform a    MRADL(s)  [x]  limitation prevents accomplishing a MRADL(s) entirely  [x]  provide independent mobility  [x]  equipment is a lifetime medical need  [x]  walker or cane inadequate  [x]  any type manual wheelchair      inadequate  [x]  scooter/POV inadequate      []  requires dependent mobility         POWER MOBILITY      []  Scooter/POV    []  can safely  operate   []  can safely transfer   []  has adequate trunk stability   []  cannot functionally propel  manual wheelchair    [x]   Power mobility base    [x]  non-ambulatory   [x]  cannot functionally propel manual wheelchair   [x]  cannot functionally and safely      operate scooter/POV  [x]  can safely operate power       wheelchair  [x]  home is accessible  [x]  willing to use power wheelchair     Tilt  [x]  Powered tilt on powered chair  []  Powered tilt on manual chair  []  Manual tilt on manual chair Comments:  [x]  change position for pressure      relief/cannot weight shift   [x]  change position against      gravitational force on head and      shoulders   []  decrease pain  []  blood pressure management   []  control autonomic dysreflexia  []  decrease respiratory distress  [x]  management of spasticity  []  management of low tone  [x]  facilitate postural control   [x]  rest periods   []  control edema  [x]  increase sitting tolerance   [x]  aid with transfers     Recline   [x]  Power recline on power chair  []  Manual recline on manual chair  Comments:    []  intermittent catheterization  [x]  manage spasticity  []  accommodate femur to back angle  [x]  change position for pressure relief/cannot weight shift, high risk of pressure sore development  [x]  tilt alone does not accomplish     effective pressure relief, maximum pressure relief achieved at -      _______ degrees tilt   _______ degrees recline   [x]  difficult to transfer to and from bed [x]  rest periods and sleeping in chair  [x]  repositioning for transfers  [x]  bring to full recline for ADL care  [x]  clothing/diaper changes in chair  []  gravity PEG tube feeding  [x]  head positioning  []  decrease pain  []  blood pressure management   []  control autonomic dysreflexia  []  decrease respiratory distress  []  user on ventilator     Elevator on mobility base  [x]  Power wheelchair  []  Scooter  [x]  increase Indep in transfers   [x]   increase Indep in ADLs    [x]  bathroom function and safety  [x]  kitchen/cooking function and safety  []  shopping  [x]  raise height for communication at standing level  [x]  raise height for eye contact which reduces cervical neck strain and pain  [x]  drive at raised height for safety and navigating crowds  []  Other:   []  Vertical position system  (anterior tilt)     (Drive locks-out)    []  Stand       (Drive enabled)  []  independent weight bearing  []  decrease joint contractures  []  decrease/manage spasticity  []  decrease/manage spasms  []  pressure distribution away from   scapula, sacrum, coccyx, and ischial tuberosity  []  increase digestion and elimination   []  access to counters and cabinets  []  increase reach  []  increase interaction with others at eye level, reduces neck strain  []  increase performance of       MRADL(s)      Power elevating legrest    []  Center mount (Single) 85-170 degrees       []  Standard (Pair) 100-170 degrees  []  position legs at 90 degrees, not available with std power ELR  []  center mount tucks into chair to decrease turning radius in home, not available with std power ELR  []  provide change in position for LE  []  elevate legs during recline    []  maintain placement  of feet on      footplate  []  decrease edema  []  improve circulation  []  actuator needed to elevate legrest  []  actuator needed to articulate legrest preventing knees from flexing  []  Increase ground clearance over      curbs  []   STD (pair) independently                     elevate legrest   POWER WHEELCHAIR CONTROLS      Controls/input device  [x]  Expandable  []  Non-expandable  [x]  Proportional  []  Right Hand [x]  Left Hand  []  Non-proportional/switches/head-array  []  Electrical/proximity         []   Mechanical      Manufacturer:___________________   Type:________________________ [x]  provides access for controlling wheelchair  [x]  programming for accurate control  []   progressive disease/changing condition  []  required for alternative drive      controls       []  lacks motor control to operate  proportional drive control  []  unable to understand proportional controls  []  limited movement/strength  []  extraneous movement / tremors / ataxic / spastic       [x]  Upgraded electronics controller/harness    []  Single power (tilt or recline)   []  Expandable    []  Non-expandable plus   [x]  Multi-power (tilt, recline, power legrest, power seat lift, vertical positioning system, stand)  [x]  allows input device to communicate with drive motors  [x]  harness provides necessary connections between the controller, input device, and seat functions     [x]  needed in order to operate power seat functions through joystick/ input device  []  required for alternative drive controls     []  Enhanced display  []  required to connect all alternative drive controls   []  required for upgraded joystick      (lite-throw, heavy duty, micro)  []  Allows user to see in which mode and drive the wheelchair is set; necessary for alternate controls       []  Upgraded tracking electronics  []  correct tracking when on uneven surfaces makes switch driving more efficient and less fatiguing  []  increase safety when driving  []  increase ability to traverse thresholds    []  Safety / reset / mode switches     Type:    []  Used to change modes and stop the wheelchair when driving     [x]  Mount for joystick / input device/switches  [x]  swing away for access or transfers   [x]  attaches joystick / input device / switches to wheelchair   [x]  provides for consistent access  []  midline for optimal placement    []  Attendant controlled joystick plus     mount  []  safety  []  long distance driving  []  operation of seat functions  []  compliance with transportation regulations    [x]  Battery  [x]  required to power (power assist / scooter/ power wc / other):   []  Power inverter (24V to 12V)  []  required  for ventilator / respiratory equipment / other:     CHAIR OPTIONS MANUAL & POWER      Armrests   [x]  adjustable height []  removable  []  swing away []  fixed  [x]  flip back  []  reclining  [x]  full length pads []  desk []  tube arms []  gel pads  [x]  provide support with elbow at 90    [x]  remove/flip back/swing away for  transfers  [x]  provide support and positioning of upper body    []   allow to come closer to table top  [x]  remove for access to tables  []  provide support for w/c tray  [x]  change of height/angles for variable activities   []  Elbow support / Elbow stop  []  keep elbow positioned on arm pad  []  keep arms from falling off arm pad  during tilt and/or recline   Upper Extremity Support  []  Arm trough  []   R  []   L  Style:  []  swivel mount []  fixed mount   []  posterior hand support  []   tray  []  full tray  []  joystick cut out  []   R  []   L  Style:  []  decrease gravitational pull on      shoulders  []  provide support to increase UE  function  []  provide hand support in natural    position  []  position flaccid UE  []  decrease subluxation    []  decrease edema       []  manage spasticity   []  provide midline positioning  []  provide work surface  []  placement for AAC/ Computer/ EADL       Hangers/ Legrests   []  ______ degree  []  Elevating []  articulating  []  swing away []  fixed []  lift off  []  heavy duty  []  adjustable knee angle  []  adjustable calf panel   []  longer extension tube              []  provide LE support  []  maintain placement of feet on      footplate   []  accommodate lower leg length  []  accommodate to hamstring       tightness  []  enable transfers  []  provide change in position for LE's  []  elevate legs during recline    []  decrease edema  []  durability      Foot support   [x]  footplate [x]  R [x]  L [x]  flip up           [x]  Depth adjustable   [x]  angle adjustable  []  foot board/one piece    [x]  provide foot support  [x]  accommodate to ankle ROM   []  allow foot to go under wheelchair base  [x]  enable transfers     []  Shoe holders  []  position foot    []  decrease / manage spasticity  []  control position of LE  []  stability    []  safety     []  Ankle strap/heel      loops  []  support foot on foot support  []  decrease extraneous movement  []  provide input to heel   []  protect foot     []  Amputee adapter []  R  []  L     Style:                  Size:  []  Provide support for stump/residual extremity    []  Transportation tie-down  []  to provide crash tested tie-down brackets    []  Crutch/cane holder    []  O2 holder    []  IV hanger   []  Ventilator tray/mount    []  stabilize accessory on wheelchair       Component  Justification     [x]  Seat cushion     Acta Embrace - Anti-thrust []  accommodate impaired sensation  []  decubitus ulcers present or history  [x]  unable to shift weight  [x]  increase pressure distribution  [x]  prevent pelvic extension  [x]  custom required "off-the-shelf"    seat cushion will not accommodate deformity  []   stabilize/promote pelvis alignment  []  stabilize/promote femur alignment  []  accommodate obliquity  []  accommodate multiple deformity  [x]  incontinent/accidents  [x]  low maintenance     []  seat mounts                 []  fixed []  removable  []  attach seat platform/cushion to wheelchair frame    []  Seat wedge    []  provide increased aggressiveness of seat shape to decrease sliding  down in the seat  []  accommodate ROM        []  Cover replacement   []  protect back or seat cushion  []  incontinent/accidents    []  Solid seat / insert    []  support cushion to prevent      hammocking  []  allows attachment of cushion to mobility base    [x]  Lateral pelvic/thigh/hip     support (Guides)     [x]  decrease abduction  [x]  accommodate pelvis  [x]  position upper legs  [x]  accommodate spasticity  [x]  removable for transfers     [x]  Lateral pelvic/thigh      supports mounts  []  fixed   []  swing-away   [x]   removable  [x]  mounts lateral pelvic/thigh supports     [x]  mounts lateral pelvic/thigh supports swing-away or removable for transfers    []  Medial thigh support (Pommel)  [] decrease adduction  [] accommodate ROM  []  remove for transfers   []  alignment      []  Medial thigh   []  fixed      support mounts      []  swing-away   []  removable  []  mounts medial thigh supports   []  Mounts medial supports swing- away or removable for transfers       Component  Justification   [x]  Back    [employer] Acta Relief   [x]  provide posterior trunk support []  facilitate tone  [x]  provide lumbar/sacral support []  accommodate deformity  [x]  support trunk in midline   [x]  custom required "off-the-shelf" back support will not accommodate deformity   [x]  provide lateral trunk support [x]  accommodate or decrease tone            []  Back mounts  []  fixed  []  removable  []  attach back rest/cushion to wheelchair frame   [x]  Lateral trunk      supports  [x]  R [x]  L  [x]  decrease lateral trunk leaning  []  accommodate asymmetry    []  contour for increased contact  [x]  safety    [x]  control of tone    [x]  Lateral trunk      supports mounts  []  fixed  [x]  swing-away   []  removable  [x]  mounts lateral trunk supports     [x]  Mounts lateral trunk supports swing-away or removable for transfers   [x]  Anterior chest      strap, vest     []  decrease forward movement of shoulder  [x]  decrease forward movement of trunk  [x]  safety/stability  []  added abdominal support  [x]  trunk alignment  []  assistance with shoulder control   []  decrease shoulder elevation    [x]  Headrest      [x]  provide posterior head support  []  provide posterior neck support  []  provide lateral head support  []  provide anterior head support  [x]  support during tilt and recline  []  improve feeding     []  improve respiration  []  placement of switches  [x]  safety    []  accommodate ROM   []  accommodate tone  []   improve visual orientation    [x]  Headrest           []  fixed [x]  removable []  flip down      Mounting hardware   []  swing-away laterals/switches  [x]  mount headrest   [x]  mounts headrest flip down or  removable for transfers  []  mount headrest swing-away laterals   []  mount switches     []  Neck Support    []  decrease neck rotation  []  decrease forward neck flexion   Pelvic Positioner    []  std hip belt          [x]  padded hip belt  []  dual pull hip belt  []  four point hip belt  [x]  stabilize tone  [x]  decrease falling out of chair  [x]  prevent excessive extension  []  special pull angle to control      rotation  [x]  pad for protection over boney   prominence  [x]  promote comfort    []  Essential needs        bag/pouch   []  medicines []  special food rorthotics []  clothing changes  []  diapers  []  catheter/hygiene []  ostomy supplies   The above equipment has a life- long use expectancy.  Growth and changes in medical and/or functional conditions would be the exceptions.   SUMMARY:  Why mobility device was selected; include why a lower level device is not appropriate: Patient requires use of a power wheelchair in order to perform safe and independent mobility in her home and in order to perform her MRADLs in a timely manner. Due to her impaired endurance and history of MS resulting in BUE weakness and decreased ROM she is unable to safely and efficiently propel herself in a manual wheelchair without putting significant strain on her shoulder joints and putting herself at risk for injury. Additionally, due to her history of MS resulting in impaired core stability and decreased LE strength and increased LE spasticity she is non-ambulatory, therefore requiring power wheeled mobility.   Additionally, a skin protection and positioning cushion alone is not sufficient for pressure relief and to prevent skin breakdown and she requires use of power tilt in addition to power recline to perform adequate pressure relief. 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 Tilt alone does not accomplish adequate pressure relief, due to her impaired core control if she only performed tilt in her chair she would be at risk for sliding forwards and falling out of the chair. Additionally, just performing tilt in a power wheelchair actually increases pressure through the sacrum, only through use of power tilt AND recliner can she effectively relief pressure and prevent skin breakdown and wound development. Additionally, she utilizes a suprapubic cath for bladder management and requires both the tilt and recline features for adequate management of her catheter.   ASSESSMENT:  CLINICAL IMPRESSION: Patient is a 33 y.o. female who was seen today for physical therapy evaluation for a new power wheelchair.   OBJECTIVE IMPAIRMENTS decreased activity tolerance, decreased balance, decreased cognition, decreased coordination, decreased mobility, difficulty walking, decreased ROM, decreased strength, decreased safety awareness, hypomobility, increased fascial restrictions, increased muscle spasms, impaired flexibility, impaired sensation, impaired tone, impaired UE functional use, improper body mechanics, and postural dysfunction.   ACTIVITY LIMITATIONS carrying, lifting, bending, sitting, standing, squatting, stairs, transfers, bed mobility, continence, bathing, toileting, dressing,  reach over head, hygiene/grooming, and locomotion level  PARTICIPATION LIMITATIONS: meal prep, cleaning, laundry, medication management, personal finances, interpersonal relationship, driving, shopping, and community activity  CLINICAL DECISION MAKING: Unstable/unpredictable  EVALUATION COMPLEXITY: High                                   GOALS: One time visit. No goals  established.    PLAN: PT FREQUENCY: one time visit    Waddell Southgate, PT Waddell Southgate, PT, DPT, CSRS  11/04/2023, 12:14 PM    I concur with the above findings and recommendations of the therapist:  Physician name printed:         Physician's signature:      Date:

## 2023-11-04 NOTE — Telephone Encounter (Signed)
 Patient was made aware. Patient state's her aid is not qualified to exchange her SP tube every 2-4 weeks.  Patient also given her next exchange date.Consulted with Dr. Sherrilee. He said she needs to be drinking 64-80 oz of fluid daily to minimize the sediment in her urine while continuing the acetic acid  flush 30 ml daily. Home health can exchange her SP tube every 2-4 weeks. Beyond that there is little we can do.

## 2023-11-07 ENCOUNTER — Ambulatory Visit

## 2023-11-08 ENCOUNTER — Telehealth: Payer: Self-pay | Admitting: Physical Therapy

## 2023-11-08 NOTE — Telephone Encounter (Signed)
 Dr. Vear,  Barbar Gearing  was evaluated by PT on 11/04/2023 for a new power wheelchair.  The patient would benefit from a referral to Home Health PT as she did not make much progress in outpatient PT due to lack of caregiver attendance with no carryover at home. If you agree, please place the appropriate order for Home Health PT for her.  Additionally, she has a follow-up appointment scheduled with you on 12/13/2023. Do you mind indicating in your appointment note for her after you see her that she has quadriparesis as she has limited UE and LE control which impairs her ability to operate a manual wheelchair and will help justify to insurance why she requires a power wheelchair?  Thank you,  Waddell Southgate, PT, DPT, [facility] [address] [place], [place]  [address] Phone:  [phone] Fax:  [phone]

## 2023-11-15 DIAGNOSIS — R5381 Other malaise: Secondary | ICD-10-CM | POA: Diagnosis not present

## 2023-11-22 ENCOUNTER — Ambulatory Visit

## 2023-11-22 DIAGNOSIS — N319 Neuromuscular dysfunction of bladder, unspecified: Secondary | ICD-10-CM

## 2023-11-22 DIAGNOSIS — R339 Retention of urine, unspecified: Secondary | ICD-10-CM | POA: Diagnosis not present

## 2023-11-22 NOTE — Progress Notes (Signed)
 Suprapubic Cath Change  Patient is present today for a suprapubic catheter change due to urinary retention.  10 ml of water  was drained from the balloon, a 22 FR foley cath was removed from the tract with out difficulty.  Suprapubic catheter site was cleaned and prepped in a sterile fashion with [id number]  A 22 FR foley cath was replaced into the tract no complications were noted. Urine return was noted, Flushed Clear yellow in color . 10 ml of sterile water  was inflated into the balloon and a leg bag was attached for drainage.  Patient tolerated well. A night bag was given to patient and proper instruction was given on how to switch bags.    Performed by: Carlos, CMA  Follow up: 4 weeks SP cath change

## 2023-11-23 ENCOUNTER — Telehealth: Payer: Self-pay

## 2023-11-23 NOTE — Telephone Encounter (Signed)
 Copied from CRM [id number]. Topic: Clinical - Medical Advice >> Nov 23, 2023 11:19 AM Nathanel BROCKS wrote: Reason for CRM: pt stated that she is wheel chair bound and the office does not have a wheel chair weight scale. She adimit about how are you going to weigh her at appt. Please advise.

## 2023-11-23 NOTE — Telephone Encounter (Signed)
 Lmtrc

## 2023-12-13 ENCOUNTER — Encounter: Payer: Self-pay | Admitting: Neurology

## 2023-12-13 ENCOUNTER — Other Ambulatory Visit: Payer: Self-pay

## 2023-12-13 ENCOUNTER — Encounter (HOSPITAL_COMMUNITY): Payer: Self-pay | Admitting: Emergency Medicine

## 2023-12-13 ENCOUNTER — Emergency Department (HOSPITAL_COMMUNITY): Admission: EM | Admit: 2023-12-13 | Discharge: 2023-12-13 | Disposition: A | Discharge status: Home / Self Care

## 2023-12-13 ENCOUNTER — Ambulatory Visit (INDEPENDENT_AMBULATORY_CARE_PROVIDER_SITE_OTHER): Admitting: Neurology

## 2023-12-13 VITALS — BP 121/80 | HR 95 | Ht 63.0 in

## 2023-12-13 DIAGNOSIS — G35 Multiple sclerosis: Secondary | ICD-10-CM | POA: Diagnosis not present

## 2023-12-13 DIAGNOSIS — T83091A Other mechanical complication of indwelling urethral catheter, initial encounter: Secondary | ICD-10-CM | POA: Insufficient documentation

## 2023-12-13 DIAGNOSIS — R252 Cramp and spasm: Secondary | ICD-10-CM

## 2023-12-13 DIAGNOSIS — G35D Multiple sclerosis, unspecified: Secondary | ICD-10-CM

## 2023-12-13 DIAGNOSIS — G8250 Quadriplegia, unspecified: Secondary | ICD-10-CM

## 2023-12-13 DIAGNOSIS — Y732 Prosthetic and other implants, materials and accessory gastroenterology and urology devices associated with adverse incidents: Secondary | ICD-10-CM | POA: Diagnosis not present

## 2023-12-13 DIAGNOSIS — R29898 Other symptoms and signs involving the musculoskeletal system: Secondary | ICD-10-CM | POA: Diagnosis not present

## 2023-12-13 DIAGNOSIS — R531 Weakness: Secondary | ICD-10-CM | POA: Diagnosis not present

## 2023-12-13 DIAGNOSIS — T839XXA Unspecified complication of genitourinary prosthetic device, implant and graft, initial encounter: Secondary | ICD-10-CM

## 2023-12-13 DIAGNOSIS — H5500 Unspecified nystagmus: Secondary | ICD-10-CM

## 2023-12-13 DIAGNOSIS — Z993 Dependence on wheelchair: Secondary | ICD-10-CM | POA: Diagnosis not present

## 2023-12-13 DIAGNOSIS — Z743 Need for continuous supervision: Secondary | ICD-10-CM | POA: Diagnosis not present

## 2023-12-13 DIAGNOSIS — Z79899 Other long term (current) drug therapy: Secondary | ICD-10-CM | POA: Diagnosis not present

## 2023-12-13 DIAGNOSIS — N3941 Urge incontinence: Secondary | ICD-10-CM

## 2023-12-13 DIAGNOSIS — G35C1 Active secondary progressive multiple sclerosis: Secondary | ICD-10-CM

## 2023-12-13 DIAGNOSIS — T83098A Other mechanical complication of other indwelling urethral catheter, initial encounter: Secondary | ICD-10-CM | POA: Diagnosis not present

## 2023-12-13 DIAGNOSIS — R270 Ataxia, unspecified: Secondary | ICD-10-CM | POA: Diagnosis not present

## 2023-12-13 NOTE — ED Triage Notes (Signed)
 Suprapubic catheter has not been draining well since Friday. Pt's mom told ems she has tried irrigating with no results.

## 2023-12-13 NOTE — ED Notes (Signed)
 [facility] called for transport at this time. Melissa West

## 2023-12-13 NOTE — ED Provider Notes (Signed)
 [place] [facility] DEPARTMENT AT [facility] Provider Note   [name]: [id number] Arrival date & time: 12/13/23  1547     Patient presents with: Catheter Replacement   Melissa West is a 33 y.o. female.   HPI    Patient states that suprapubic catheter has not been draining since Friday.  Patient states that they have been try to flush out without any results.  Patient states that she wears a diaper at baseline and has been producing urine otherwise into her diaper.  Denies any fever or chills.  No chest pain or shortness of breath.  No nausea vomit diarrhea.  Otherwise, feels completely at baseline.  Denies all other complaints.  States the only complaint is lack of urine production from her Foley but once again she states that she been producing urine normally otherwise.   Previous medical history reviewed : History of multiple sclerosis.  Wheelchair-bound.  She was last seen in the ED on October 03, 2023 because of urinary retention.  Catheter was replaced.  Prior to Admission medications   Medication Sig Start Date End Date Taking? Authorizing Provider  acetaminophen  (TYLENOL ) 325 MG tablet Take 2 tablets (650 mg total) by mouth every 6 (six) hours as needed for mild pain (or Fever >/= 101). Patient not taking: Reported on 12/13/2023 05/20/21   Pearlean Manus, MD  acetic acid  0.25 % irrigation IRRIGATE WITH AS DIRECTED DAILY Patient not taking: Reported on 12/13/2023 03/22/23   Sherrilee Belvie CROME, MD  amantadine  (SYMMETREL ) 100 MG capsule Take 1 capsule (100 mg total) by mouth 2 (two) times daily. Patient not taking: Reported on 12/13/2023 01/26/23   Sater, Charlie LABOR, MD  AMBULATORY NON FORMULARY MEDICATION 30 mLs by Intracatheter route daily as needed. Medication Name: Sodium Chloride  0.9% Patient not taking: Reported on 12/13/2023 11/12/21   Sherrilee Belvie CROME, MD  AMBULATORY NON FORMULARY MEDICATION Medication Name: Bedside catheter bag Patient not taking: Reported on 12/13/2023  08/12/22   Watt Rush, MD  baclofen  (LIORESAL ) 10 MG tablet Take 1 tablet (10 mg total) by mouth 3 (three) times daily. Patient not taking: Reported on 12/13/2023 10/18/22   Sater, Charlie LABOR, MD  ciprofloxacin  (CIPRO ) 500 MG tablet Take 1 tablet (500 mg total) by mouth every 12 (twelve) hours. Patient not taking: Reported on 12/13/2023 05/18/23   Francis Ileana SAILOR, PA-C  KESIMPTA 20 MG/0.4ML SOAJ Inject 20 mg into the skin every 30 (thirty) days. Patient not taking: Reported on 12/13/2023 06/24/21   [provider]  meclizine  (ANTIVERT ) 25 MG tablet Take 1 tablet (25 mg total) by mouth 3 (three) times daily as needed for dizziness. Patient not taking: Reported on 12/13/2023 12/29/22   Zammit, Joseph, MD  metoCLOPramide  (REGLAN ) 10 MG tablet Take 1 tablet (10 mg total) by mouth every 6 (six) hours. Patient not taking: Reported on 12/13/2023 04/21/23   Yolande Lamar BROCKS, MD  Skin Protectants, Misc. [facility]) OINT Apply topically Patient not taking: Reported on 12/13/2023 05/26/23   Zarwolo, Gloria, FNP  Syringe, Disposable, (50-60CC SYRINGE) 60 ML MISC Flush 30-50cc of sterile water  into catheter daily as needed Patient not taking: Reported on 12/13/2023 01/15/22   Sherrilee Belvie CROME, MD  Vitamin D , Ergocalciferol , (DRISDOL ) 1.25 MG (50000 UNIT) CAPS capsule Take 1 capsule (50,000 Units total) by mouth every 7 (seven) days. Patient not taking: Reported on 12/13/2023 06/25/23   Zarwolo, Gloria, FNP  Water  For Irrigation, Sterile (STERILE WATER  FOR IRRIGATION) Irrigate with 50 mLs as directed daily. Irrigate catheter daily  with 50cc of sterile water . Patient not taking: Reported on 12/13/2023 01/15/22   Sherrilee Belvie CROME, MD    Allergies: Patient has no known allergies.    Review of Systems  Constitutional:  Negative for chills and fever.  HENT:  Negative for ear pain and sore throat.   Eyes:  Negative for pain and visual disturbance.  Respiratory:  Negative for cough and shortness of breath.   Cardiovascular:   Negative for chest pain and palpitations.  Gastrointestinal:  Negative for abdominal pain and vomiting.  Genitourinary:  Negative for dysuria and hematuria.  Musculoskeletal:  Negative for arthralgias and back pain.  Skin:  Negative for color change and rash.  Neurological:  Negative for seizures and syncope.  All other systems reviewed and are negative.   Updated Vital Signs BP 113/67   Pulse 88   Temp 98.7 F (37.1 C)   Resp 17   Ht 5' 3 (1.6 m)   Wt 50.8 kg   SpO2 99%   BMI 19.84 kg/m   Physical Exam Vitals and nursing note reviewed.  Constitutional:      General: She is not in acute distress.    Appearance: She is well-developed.  HENT:     Head: Normocephalic and atraumatic.  Eyes:     Conjunctiva/sclera: Conjunctivae normal.  Cardiovascular:     Rate and Rhythm: Normal rate and regular rhythm.     Heart sounds: No murmur heard. Pulmonary:     Effort: Pulmonary effort is normal. No respiratory distress.     Breath sounds: Normal breath sounds.  Abdominal:     Palpations: Abdomen is soft.     Tenderness: There is no abdominal tenderness.  Musculoskeletal:        General: No swelling.     Cervical back: Neck supple.  Skin:    General: Skin is warm and dry.     Capillary Refill: Capillary refill takes less than 2 seconds.  Neurological:     Mental Status: She is alert.  Psychiatric:        Mood and Affect: Mood normal.     (all labs ordered are listed, but only abnormal results are displayed) Labs Reviewed - No data to display  EKG: None  Radiology: No results found.   SUPRAPUBIC TUBE PLACEMENT  Date/Time: 12/13/2023 6:36 PM  Performed by: Simon Lavonia SAILOR, MD Authorized by: Simon Lavonia SAILOR, MD   Consent:    Consent obtained:  Verbal   Consent given by:  Patient   Risks, benefits, and alternatives were discussed: yes     Risks discussed:  Bleeding, bowel perforation, infection and pain   Alternatives discussed:  Alternative treatment Universal  protocol:    Patient identity confirmed:  Verbally with patient Sedation:    Sedation type:  None Anesthesia:    Anesthesia method:  None Procedure details:    Complexity:  Simple   Catheter type:  Foley   Catheter size:  22 Fr   Ultrasound guidance: yes     Number of attempts:  1   Urine characteristics:  Yellow Post-procedure details:    Procedure completion:  Tolerated    Medications Ordered in the ED - No data to display                                  Medical Decision Making   Patient states that suprapubic catheter has not been draining since Friday.  Patient states that they have been try to flush out without any results.  Patient states that she wears a diaper at baseline and has been producing urine otherwise into her diaper.  Denies any fever or chills.  No chest pain or shortness of breath.  No nausea vomit diarrhea.  Otherwise, feels completely at baseline.  Denies all other complaints.  States the only complaint is lack of urine production from her Foley but once again she states that she been producing urine normally otherwise.   Previous medical history reviewed : History of multiple sclerosis.  Wheelchair-bound.  She was last seen in the ED on October 03, 2023 because of urinary retention.  Catheter was replaced.   On exam, patient Impeklo stable.  ANO x 3 GCS 15.  Afebrile.  Maps appropriate.  No tachycardia.   Patient remained hemodynamically stable throughout the ED stay.  No indication to obtain laboratory workup and/or send urine at this point time.  She probably has colonization of the bladder given indwelling suprapubic Foley catheter but given she is asymptomatic, do not need to obtain urine.  Foley exchange as above.  Sterile technique used.  Patient verbally consented and understood risk of possible infection, trauma, creation of new fistula, other complications.  She is in agreement to all these.  She had some maybe some mild stricturing of the actual  Foley tract.  Somewhat difficult to remove the initial Foley despite balloon being completely deflated.  Was able to remove and then subsequently placed a new suprapubic Foley.  Minimal blood in the actual urinary bag after this.  Once again, I think this is because there were some strictures in the Foley tract.  Side ultrasound showed Foley balloon in the bladder.  Patient was watched in the ED.  No acute complications.  Remained hemodynamically stable.  Asymptomatic.  No indications for further laboratory or imaging workup      Final diagnoses:  Problem with Foley catheter, initial encounter [facility])    ED Discharge Orders     None          Simon Lavonia SAILOR, MD 12/13/23 1931

## 2023-12-13 NOTE — Progress Notes (Addendum)
 [facility]  PATIENT: Melissa West DOB: [birth year]  REFERRING DOCTOR OR PCP:  Norleen Hurst SOURCE: Patient, notes from primary care, notes from [facility] neurology, multiple MRI reports,[id number]  _________________________________   HISTORICAL  CHIEF COMPLAINT:  Chief Complaint  Patient presents with   Follow-up    Pt in room 11. Alone. Here for MS follow up. Patient reports she is not able to walk, c/o muscle stiffness. Pt would like Physical therapy to help.     HISTORY OF PRESENT ILLNESS:   Melissa West is a 33 year old woman with relapsing multiple sclerosis and quadriparesis who is wheelchair-bound.     Update 12/13/2023: Mobility issues: [name] is a 33 year old woman with multiple sclerosis who has bilateral leg weakness and spasticity.  She is currently wheelchair-bound due to an inability to walk but the MS related weakness and spasticity.  Her mobility issues prevent her from completing activities of daily living including personal hygiene, meal preparation, light chores..  She is unable to safely ambulate with a walker or cane.  She has right larm weakness and bilateral ataxia and can not adequately self propel a wheelchair.   Due to the ataxia and inability to operate a scooter in her home, she needs a power wheelchair.    She has more weakness in the right hand then the left.  Therefore hand controls should be left sided  ADL's Due to her gait impairment and weakness she has difficulty doing activities of daily living including self-care, going to the bathroom, meal prep, simple household chores..  She has difficulty controlling a manual wheelchair, she is unable to consistently go from room to room..  She is unable to stand to do tasks requiring her to stand.  MS:  For her disease modifying therapy, she has been on Kesimpta  since 2023 but stopped in 2024 (unclear why).  Before that she was on Tysabri  until she did very well but had  difficulty with travel.  She then tried Mayzent but felt she had done better on other medications.   MRIs of the head and cervical spine 10/08/2022 did not show any new lesions.  She has bilateral leg weakness and spasticity and is wheelchair bound.  The spasticity limits movements The left leg gets phasic spasms easily.   Additionally she has right arm weakness.  There is bilateral arm ataxia.  Movements can increase spasticity.   She is on baclofen  10 mg po tid - helps spasticity but makes her sleepy.   She denies numbness.    She has a catheter now for the bladder and is seeing urology again soon.     She notes decreased vision much worse on right.    Colors are desaturated OD.  She has a dysconjugate gaze.  The ataxia makes it difficult for her to write.  The ataxia is worse on the right and fortunately she is left-handed.  She feels cogniton is the same.  Her mom feels she is more forgetful than she was previously.   MS history She was diagnosed with MS around 2009..  At age 33, she had some falls due to poor balance not weakness.   A few months later, she had MRI's performed consistent with MS.  She was placed on Betaseron initially.    She did well for a while and was walking without an aid.  She returned to work.    She thinks she stopped the Betaseron and then had more symptoms including poor gait and  diplopia.   She started Tysabri  but stopped it during pregnancy.   She had a period of time (2013-2014) without any medications (she was still doing relatively well then).    She had more trouble with her right leg with weakness and spasticity and returned to [facility] (Dr. Clarice) in 2014.   She was placed back on Tysabri  and stayed on until October 2016.   JCV Ab was 0.23 indeterminate 12/2014.    She then stopped the Tysabri  and started to see Dr. Milton.   She started Tecfidera but she thinks she was worsening.  In 2018, she had a positive JCV Ab titer (0.44) and a decision was made to swtich to Ocrevus .     About 2 years ago, she went on Ocrevus  (thinks only 2 doses).  She hasn't seen any neurologist since and she feels she has progressed a lot.   She was placed back on Tysabri  in 2020 but stopped in 2021 due to poor access issues and difficulty with travel.  She then was on Mayzent but stopped that for an unknown reason in 2022.   She started Kesimpta  in 2023 but stopped in 2024 (unclear why).     Imaging:  CT scan of the head 07/02/2012 showed white matter lesions consistent with MS that were not present in 2009.  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  MRI of the lumbar spine 10/31/2012 showed demyelinating plaque in the lower thoracic spinal cord and conus medullaris.  MRI report 12/28/2013 of the brain (compared to 2011 )states: Interval disease progression indicated by increased number of innumerable T2 hyperintense lesions within the periventricular and subcortical white matter involving both cerebral hemispheres, with additional foci in the cerebellum and brainstem. No enhancing lesions or restricted diffusion.  MRI cervical spine report 04/12/2013 (compared to 2011) shows: Extensive signal abnormality within the brainstem and cervical spinal cord in keeping with multiple sclerosis. There has been interval progression of disease within the brainstem and possibly at C3-4 within the spinal cord. No enhancement seen on today's exam.  MRI brain 02/02/2019 shows multiple T2/flair hyperintense foci in the cerebellum, brainstem and cerebral hemispheres in a pattern and configuration consistent with chronic demyelinating plaque associated with multiple sclerosis.  None of the foci enhances or appears to be acute. .   Absence of the septum pellucidum, reduced size of the optic nerves and reduced size of the corpus callosum.  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  T2 hyperintense foci scattered within the spinal cord at the cervicomedullary junction, and from C2 through C4 and C6-C7. SABRA   There were no significant degenerative changes  MRI of the brain and cervical spine 06/08/2020 was unchanged compared to previous MRI.  MRI of the brain 10/08/2022 showed no new lesions compared to the 06/08/2021 MRI.  MRI of the cervical spine 10/08/2022 showed no new lesions compared to the 06/08/2021 MRI.  REVIEW OF SYSTEMS: Constitutional: No fevers, chills, sweats, or change in appetite.  She has excessive sleepiness.   Eyes: No visual changes, double vision, eye pain Ear, nose and throat: No hearing loss, ear pain, nasal congestion, sore throat Cardiovascular: No chest pain, palpitations Respiratory:  No shortness of breath at rest or with exertion.   She snores but reports that a sleep study did not show sleep apnea.   GastrointestinaI: No nausea, vomiting, diarrhea, abdominal pain,  fecal incontinence Genitourinary:  No dysuria, urinary retention or frequency.  No nocturia. Musculoskeletal:  No neck pain, back pain Integumentary: No rash, pruritus, skin lesions Neurological: as above Psychiatric: No depression at this time.  No anxiety Endocrine: No palpitations, diaphoresis, change in appetite, change in weigh or increased thirst Hematologic/Lymphatic:  No anemia, purpura, petechiae. Allergic/Immunologic: No itchy/runny eyes, nasal congestion, recent allergic reactions, rashes  ALLERGIES: No Known Allergies  HOME MEDICATIONS:  Current Outpatient Medications:    acetaminophen  (TYLENOL ) 325 MG tablet, Take 2 tablets (650 mg total) by mouth every 6 (six) hours as needed for mild pain (or Fever >/= 101). (Patient not taking: Reported on 01/19/2024), Disp: 12 tablet, Rfl: 0   acetic acid  0.25 % irrigation, IRRIGATE WITH AS DIRECTED DAILY (Patient not taking: Reported on 01/19/2024), Disp: 1000 mL, Rfl: 11   amantadine  (SYMMETREL ) 100 MG  capsule, Take 1 capsule (100 mg total) by mouth 2 (two) times daily. (Patient not taking: Reported on 01/19/2024), Disp: 60 capsule, Rfl: 11   AMBULATORY NON FORMULARY MEDICATION, 30 mLs by Intracatheter route daily as needed. Medication Name: Sodium Chloride  0.9% (Patient not taking: Reported on 01/19/2024), Disp: 900 mL, Rfl: 11   AMBULATORY NON FORMULARY MEDICATION, Medication Name: Bedside catheter bag (Patient not taking: Reported on 01/19/2024), Disp: 2 Bag, Rfl: 12   baclofen  (LIORESAL ) 10 MG tablet, Take 1 tablet (10 mg total) by mouth 3 (three) times daily. (Patient not taking: Reported on 01/19/2024), Disp: 90 each, Rfl: 11   ciprofloxacin  (CIPRO ) 500 MG tablet, Take 1 tablet (500 mg total) by mouth every 12 (twelve) hours. (Patient not taking: Reported on 01/19/2024), Disp: 14 tablet, Rfl: 0   KESIMPTA  20 MG/0.4ML SOAJ, 0.78ml into the skin at week 0, 1 and 2. Then start on maintenance dose at week 4. (Patient not taking: Reported on 01/19/2024), Disp: 1.2 mL, Rfl: 0   KESIMPTA  20 MG/0.4ML SOAJ, Inject 0.4 mLs into the skin every 30 (thirty) days. Starting at week 4, Disp: 0.4 mL, Rfl: 11   meclizine  (ANTIVERT ) 25 MG tablet, Take 1 tablet (25 mg total) by mouth 3 (three) times daily as needed for dizziness. (Patient not taking: Reported on 01/19/2024), Disp: 30 tablet, Rfl: 0   metoCLOPramide  (REGLAN ) 10 MG tablet, Take 1 tablet (10 mg total) by mouth every 6 (six) hours. (Patient not taking: Reported on 01/19/2024), Disp: 12 tablet, Rfl: 0   nitrofurantoin , macrocrystal-monohydrate, (MACROBID ) 100 MG capsule, Take 1 capsule (100 mg total) by mouth 2 (two) times daily., Disp: 10 capsule, Rfl: 0   Skin Protectants, Misc. (PERIGUARD) OINT, Apply topically (Patient not taking: Reported on 01/19/2024), Disp: 227 g, Rfl: 1   Syringe, Disposable, (50-60CC SYRINGE) 60 ML MISC, Flush 30-50cc of sterile water  into catheter daily as needed, Disp: 30 each, Rfl: 11   Vitamin D , Ergocalciferol , (DRISDOL ) 1.25 MG  (50000 UNIT) CAPS capsule, Take 1 capsule (50,000 Units total) by mouth every 7 (seven) days. (Patient not taking: Reported on 01/19/2024), Disp: 20 capsule, Rfl: 1   Water  For Irrigation, Sterile (STERILE WATER ), Irrigate with 50 mLs as directed daily. Irrigate catheter daily with 50cc of sterile water ., Disp: 1000 mL, Rfl: 15  PAST MEDICAL HISTORY: Past Medical History:  Diagnosis Date   MS (multiple sclerosis)    No pertinent past medical history     PAST SURGICAL HISTORY: Past Surgical History:  Procedure Laterality Date   CESAREAN SECTION  08/09/2011   Procedure: CESAREAN SECTION;  Surgeon: Burnard VEAR Pate, MD;  Location: [facility];  Service: Gynecology;  Laterality: N/A;   IR CATHETER TUBE CHANGE  07/22/2021   IR CATHETER TUBE CHANGE  09/10/2021    FAMILY HISTORY: Family History  Problem Relation Age of Onset   Diabetes Maternal Grandmother    Hypertension Maternal Grandmother    Kidney disease Maternal Grandmother     SOCIAL HISTORY:  Social History   Socioeconomic History   Marital status: Single    Spouse name: Not on file   Number of children: Not on file   Years of education: Not on file   Highest education level: Not on file  Occupational History   Not on file  Tobacco Use   Smoking status: Never    Passive exposure: Never   Smokeless tobacco: Never  Vaping Use   Vaping status: Never Used  Substance and Sexual Activity   Alcohol  use: No   Drug use: No   Sexual activity: Yes    Birth control/protection: None  Other Topics Concern   Not on file  Social History Narrative   Left handed    Lives with kid   Caffeine use: none   Social Drivers of Corporate investment banker Strain: Not on file  Food Insecurity: No Food Insecurity (10/09/2022)   Hunger Vital Sign    Worried About Running Out of Food in the Last Year: Never true    Ran Out of Food in the Last Year: Never true  Transportation Needs: No Transportation Needs (10/09/2022)   PRAPARE -  Administrator, Civil Service (Medical): No    Lack of Transportation (Non-Medical): No  Physical Activity: Not on file  Stress: Not on file  Social Connections: Not on file  Intimate Partner Violence: Not At Risk (10/09/2022)   Humiliation, Afraid, Rape, and Kick questionnaire    Fear of Current or Ex-Partner: No    Emotionally Abused: No    Physically Abused: No    Sexually Abused: No     PHYSICAL EXAM  Vitals:   12/13/23 1124  BP: 121/80  Pulse: 95  Height: 5' 3 (1.6 m)     Body mass index is 19.84 kg/m.   General: The patient is well-developed and well-nourished and in no acute distress  HEENT:  Head is [place]/AT.  Sclera are anicteric.    Skin: Extremities are without rash or  edema.  Musculoskeletal:  Back is nontender  Neurologic Exam  Mental status: The patient is alert and oriented x 3 at the time of the examination. The patient has apparent normal recent and remote memory, with an apparently normal attention span and concentration ability.   Speech is normal.  Cranial nerves:there is disconjugate gaze with left exotropia..  She has nystagmus with all gazes, worse on left gaze..   There is good facial sensation to soft touch bilaterally.Facial strength is normal.  Trapezius and sternocleidomastoid strength is normal.  She has mild dysarthria is noted.   No obvious hearing deficits are noted.  Motor:  Muscle bulk is normal.   Tone is severely increased in the legs.  Strength was fairly normal in the left arm though rapid alternating movements were poor in the left hand.  Strength was 4- to 4/5 in the right arm.  Strength was 2- /5 in the legs.  Sensory: Sensory testing is intact to pinprick, soft touch and vibration sensation in the arms but reduced vibration in the legs.  Coordination: She has ataxic finger-nose-finger bilaterally, right worse than left.    Gait and  station: She is wheelchair-bound and cannot stand even with strong support.      Reflexes: Deep tendon reflexes are increased.  In the legs, there is spread at the knees and clonus at the ankles.  Plantar responses are extensor.      ASSESSMENT AND PLAN  1. Active secondary progressive multiple sclerosis   2. MS (multiple sclerosis)   3. Quadriparesis (HCC)   4. Wheelchair dependence   5. Leg weakness, bilateral   6. Spasticity   7. High risk medication use   8. Urinary incontinence, urge   9. Nystagmus   10. Ataxia      1.   Due to impaired mobility from her multiple sclerosis causing weakness, spasticity and ataxia, she has difficulties with activities of daily living.  She is currently using a manual wheelchair but is unable to move well from 1 room to another to do her ADLs.  She cannot use a scooter in the home due to the turning radius.  Additionally the tiller will get in the way of her activities.  Therefore she needs a wheelchair.  She had a wheelchair evaluation by physical therapy 11/04/2023.  I concur with their findings and recommendation.   She needs additional features including powered tilt and powered recline and elevator.   The hand controls need to be on the left.  She has the ability to operate this type of powered wheelchair and is motivated to use it. 2.   We will get her back on Kesimpta .  Unclear why discontinued.    3.   continue baclofen  - not clear why it was stopped but spasticity is worse without it.  If trouble tolerating, consider  tizanidine.     4.   I will also order home health physical therapy and Occupational Therapy to help with her spasticity.  She is not ambulatory.     5.    rtc 6 months, sooner if new pr worsening issues  This visit is part of a comprehensive longitudinal care medical relationship regarding the patients primary diagnosis of MS and related concerns.  42-minute office visit with the majority of the time spent face-to-face for history and physical, discussion/counseling and decision-making.  Additional time  with record review and documentation.  Phone is [phone]  Kingstyn Deruiter A. Vear, MD, [facility] 01/26/2024, 5:15 PM Certified in Neurology, Clinical Neurophysiology, Sleep Medicine and Neuroimaging  [facility] [address] [place], [place] [address] [phone]

## 2023-12-13 NOTE — Discharge Instructions (Addendum)
 If you have any, fever or chills please come back to the ED.  If you have worsening abdominal pain please come back to the ED.  There was a minimal amount of blood.  This is likely because of some structures of your tract.  Please come back if this gets worse.

## 2023-12-14 NOTE — Progress Notes (Signed)
 Faxed signed PT eval and Dr. Duncan OV note re: Need for wheelchair to [employer] at [phone]. Received fax confirmation.

## 2023-12-15 ENCOUNTER — Telehealth: Payer: Self-pay | Admitting: Neurology

## 2023-12-15 NOTE — Telephone Encounter (Signed)
 [facility] called stating that  Rep got disconnected with Nurse is it possible to call back    [facility] Phone number is  [phone]

## 2023-12-15 NOTE — Telephone Encounter (Signed)
 Called [facility] back and they stated that a nurse went to pt's house to examine her and saw that pt doesn't take any medications. Nurse asked pt does she take anything and pt said No. Nurse asked pt what can their services do for her and pt said nothing. Another nurse is coming out again to see pt for discharge. We will be receiving a call after that visit.

## 2023-12-16 ENCOUNTER — Ambulatory Visit: Payer: Self-pay | Admitting: Neurology

## 2023-12-16 LAB — QUANTIFERON-TB GOLD PLUS
QuantiFERON Mitogen Value: >10 [IU]/mL
QuantiFERON Nil Value: 0.02 [IU]/mL
QuantiFERON TB1 Ag Value: 0.03 [IU]/mL
QuantiFERON TB2 Ag Value: 0.02 [IU]/mL

## 2023-12-16 LAB — HEPATITIS B SURFACE ANTIGEN: Hepatitis B Surface Ag: NEGATIVE

## 2023-12-16 LAB — IGG, IGA, IGM
IgA/Immunoglobulin A, Serum: 790 mg/dL — ABNORMAL HIGH (ref 87–352)
IgG (Immunoglobin G), Serum: 2341 mg/dL — ABNORMAL HIGH (ref 586–1602)
IgM (Immunoglobulin M), Srm: 75 mg/dL (ref 26–217)

## 2023-12-16 LAB — HEPATITIS B CORE ANTIBODY, TOTAL: Hep B Core Total Ab: NEGATIVE

## 2023-12-16 NOTE — Telephone Encounter (Addendum)
 Called pt and let her know the below results.  ----- Message from Charlie DELENA Crete sent at 12/16/2023 10:16 AM EDT -----  Labs are fine to get back on Kesimpta.  I can sign the form on Monday. ----- Message ----- From: Interface, [facility] Lab Results In Sent: 12/14/2023   7:37 AM EDT To: Charlie DELENA Crete, MD

## 2023-12-19 ENCOUNTER — Telehealth: Payer: Self-pay | Admitting: *Deleted

## 2023-12-19 DIAGNOSIS — G35 Multiple sclerosis: Secondary | ICD-10-CM

## 2023-12-19 NOTE — Telephone Encounter (Signed)
Faxed completed/signed Kesimpta start form to [facility] at [phone]. Received fax confirmation.  

## 2023-12-19 NOTE — Telephone Encounter (Signed)
 Form signed and faxed to [phone], confirmation received.

## 2023-12-19 NOTE — Telephone Encounter (Signed)
 PA not needed per [facility]:  Key: [id number]

## 2023-12-20 ENCOUNTER — Telehealth: Payer: Self-pay

## 2023-12-20 NOTE — Telephone Encounter (Signed)
 Faxed signed orders for PWC back to [employer] at [phone]. Received fax confirmation.

## 2023-12-20 NOTE — Telephone Encounter (Signed)
 Copied from CRM [id number]. Topic: General - Other >> Dec 20, 2023  8:28 AM Frederich PARAS wrote: Reason for CRM: Pt Calling therapist says they need more inforamtion , they need a couple of things from the dr. for them coming out to the home. therapist says he have been trying to reach out but have not got any contact with anyone.   pt thinks they need something from the [facility] in order for the pat to get a hosptial bed. Pt callback # is [phone],

## 2023-12-21 NOTE — Telephone Encounter (Signed)
 Refaxed 22 pages, received fax confirmation.

## 2023-12-21 NOTE — Telephone Encounter (Signed)
 We faxed 22 pages total yesterday for PWC orders. I called and LVM for Montie to call back

## 2023-12-21 NOTE — Telephone Encounter (Signed)
 Cynthia at [employer]([phone]) is asking if the entire 25 page fax was received, they only received 1 form back, total of 4 pages was received back, please call Montie

## 2023-12-23 ENCOUNTER — Ambulatory Visit

## 2023-12-23 DIAGNOSIS — R339 Retention of urine, unspecified: Secondary | ICD-10-CM

## 2023-12-23 DIAGNOSIS — N319 Neuromuscular dysfunction of bladder, unspecified: Secondary | ICD-10-CM

## 2023-12-23 MED ORDER — SYRINGE 50-60 ML 60 ML MISC: 11 refills | Status: AC

## 2023-12-23 MED ORDER — STERILE WATER FOR IRRIGATION IR SOLN: 50.0000 mL | Freq: Every day | 15 refills | Status: AC

## 2023-12-23 NOTE — Progress Notes (Signed)
 Suprapubic Cath Change  Patient is present today for a suprapubic catheter change due to urinary retention.  10 ml of water  was drained from the balloon, a 22 FR foley cath was removed from the tract with out difficulty.  Suprapubic catheter site was cleaned and prepped in a sterile fashion with Betadinex3  A 22 FR foley cath was replaced into the tract no complications were noted. 40 cc Flush return was noted, urine Clear yellow in color . 10 ml of sterile water  was inflated into the balloon and a leg bag was attached for drainage.  Patient tolerated well. A night bag was given to patient and proper instruction was given on how to switch bags.    Performed by: Carlos, CMA  Follow up: Monthly SP Tube Change

## 2023-12-29 NOTE — Telephone Encounter (Signed)
 Patient said phone was not correct.  Check the note and repeated the number. Patient said one number was wrong and will try to call the number again.

## 2023-12-29 NOTE — Telephone Encounter (Signed)
 I called patient.  She denies receiving any phone calls from it [facility].  I gave her the [facility] phone number of [phone].  I asked her to call this program.  She assures me she will take care of this.  I will check on her in the next 2 weeks.

## 2024-01-02 ENCOUNTER — Telehealth: Payer: Self-pay | Admitting: Neurology

## 2024-01-02 NOTE — Telephone Encounter (Signed)
 Melissa West From [facility] called to request for Pt to get order in  a Hospital  bed Melissa West stated he spoke to someone about this however never heard back  and wanted to follow up    Melissa West call back is  [phone]

## 2024-01-02 NOTE — Telephone Encounter (Signed)
 [place] From [facility] called to request for Pt to get order in  a [facility] stated he spoke to someone about this however never heard back  and wanted to follow up   Medford call back is  [phone]

## 2024-01-02 NOTE — Telephone Encounter (Signed)
 Phone room: Please call back. We did not place order for hospital bed. Looks like PCP office did on 12/26/23. They need to contact them to discuss. See phone number below to provide them.  Mrs. Foots,  I have placed the order through [facility] for you to get a hospital bed. They are processing the order now. You should have gotten a text from them letting you know the order was placed and an option to track the status of the order. Let me know if you have any questions.  Thanks  Daphne Pellet, LPN Practice Leader-Clinical [facility] [phone]

## 2024-01-03 NOTE — Telephone Encounter (Signed)
 Phone room: it was [facility] that called. Can you please call them also and let them know?

## 2024-01-04 ENCOUNTER — Emergency Department (HOSPITAL_COMMUNITY)
Admission: EM | Admit: 2024-01-04 | Discharge: 2024-01-04 | Disposition: A | Discharge status: Home / Self Care | Attending: Emergency Medicine | Admitting: Emergency Medicine

## 2024-01-04 ENCOUNTER — Other Ambulatory Visit: Payer: Self-pay

## 2024-01-04 DIAGNOSIS — T83091A Other mechanical complication of indwelling urethral catheter, initial encounter: Secondary | ICD-10-CM | POA: Diagnosis not present

## 2024-01-04 DIAGNOSIS — T83198A Other mechanical complication of other urinary devices and implants, initial encounter: Secondary | ICD-10-CM | POA: Diagnosis not present

## 2024-01-04 DIAGNOSIS — M6281 Muscle weakness (generalized): Secondary | ICD-10-CM | POA: Diagnosis not present

## 2024-01-04 DIAGNOSIS — T83010A Breakdown (mechanical) of cystostomy catheter, initial encounter: Secondary | ICD-10-CM

## 2024-01-04 DIAGNOSIS — G35 Multiple sclerosis: Secondary | ICD-10-CM | POA: Diagnosis not present

## 2024-01-04 DIAGNOSIS — Z7401 Bed confinement status: Secondary | ICD-10-CM | POA: Diagnosis not present

## 2024-01-04 DIAGNOSIS — Y732 Prosthetic and other implants, materials and accessory gastroenterology and urology devices associated with adverse incidents: Secondary | ICD-10-CM | POA: Insufficient documentation

## 2024-01-04 DIAGNOSIS — T83018A Breakdown (mechanical) of other indwelling urethral catheter, initial encounter: Secondary | ICD-10-CM | POA: Diagnosis not present

## 2024-01-04 DIAGNOSIS — T85698A Other mechanical complication of other specified internal prosthetic devices, implants and grafts, initial encounter: Secondary | ICD-10-CM | POA: Diagnosis not present

## 2024-01-04 DIAGNOSIS — R102 Pelvic and perineal pain: Secondary | ICD-10-CM | POA: Diagnosis not present

## 2024-01-04 DIAGNOSIS — R262 Difficulty in walking, not elsewhere classified: Secondary | ICD-10-CM | POA: Diagnosis not present

## 2024-01-04 DIAGNOSIS — R531 Weakness: Secondary | ICD-10-CM | POA: Diagnosis not present

## 2024-01-04 NOTE — ED Provider Notes (Signed)
 [place] EMERGENCY DEPARTMENT AT [facility] Provider Note   [name]: [id number] Arrival date & time: 01/04/24  1130     Patient presents with: No chief complaint on file.   Melissa West is a 33 y.o. female.  She is here with a complaint of clogged suprapubic catheter.  She said she had a catheter for years and it seems to get blocked up every month.  She follows with urology but this still happens.  She has a history of MS and is bedbound.  No other real complaints.  No fevers chills nausea vomiting.   The history is provided by the patient.       Prior to Admission medications   Medication Sig Start Date End Date Taking? Authorizing Provider  acetaminophen  (TYLENOL ) 325 MG tablet Take 2 tablets (650 mg total) by mouth every 6 (six) hours as needed for mild pain (or Fever >/= 101). Patient not taking: Reported on 12/13/2023 05/20/21   Pearlean Manus, MD  acetic acid  0.25 % irrigation IRRIGATE WITH AS DIRECTED DAILY Patient not taking: Reported on 12/13/2023 03/22/23   Sherrilee Belvie CROME, MD  amantadine  (SYMMETREL ) 100 MG capsule Take 1 capsule (100 mg total) by mouth 2 (two) times daily. Patient not taking: Reported on 12/13/2023 01/26/23   Sater, Charlie LABOR, MD  AMBULATORY NON FORMULARY MEDICATION 30 mLs by Intracatheter route daily as needed. Medication Name: Sodium Chloride  0.9% Patient not taking: Reported on 12/13/2023 11/12/21   Sherrilee Belvie CROME, MD  AMBULATORY NON FORMULARY MEDICATION Medication Name: Bedside catheter bag Patient not taking: Reported on 12/13/2023 08/12/22   Watt Rush, MD  baclofen  (LIORESAL ) 10 MG tablet Take 1 tablet (10 mg total) by mouth 3 (three) times daily. Patient not taking: Reported on 12/13/2023 10/18/22   Vear Charlie LABOR, MD  ciprofloxacin  (CIPRO ) 500 MG tablet Take 1 tablet (500 mg total) by mouth every 12 (twelve) hours. Patient not taking: Reported on 12/13/2023 05/18/23   Francis Ileana SAILOR, PA-C  KESIMPTA  20 MG/0.4ML SOAJ Inject 20 mg into the  skin every 30 (thirty) days. Patient not taking: Reported on 12/13/2023 06/24/21   [provider]  meclizine  (ANTIVERT ) 25 MG tablet Take 1 tablet (25 mg total) by mouth 3 (three) times daily as needed for dizziness. Patient not taking: Reported on 12/13/2023 12/29/22   Zammit, Joseph, MD  metoCLOPramide  (REGLAN ) 10 MG tablet Take 1 tablet (10 mg total) by mouth every 6 (six) hours. Patient not taking: Reported on 12/13/2023 04/21/23   Yolande Lamar BROCKS, MD  Skin Protectants, Misc. [facility]) OINT Apply topically Patient not taking: Reported on 12/13/2023 05/26/23   Zarwolo, Gloria, FNP  Syringe, Disposable, (50-60CC SYRINGE) 60 ML MISC Flush 30-50cc of sterile water  into catheter daily as needed 12/23/23   Sherrilee Belvie CROME, MD  Vitamin D , Ergocalciferol , (DRISDOL ) 1.25 MG (50000 UNIT) CAPS capsule Take 1 capsule (50,000 Units total) by mouth every 7 (seven) days. Patient not taking: Reported on 12/13/2023 06/25/23   Zarwolo, Gloria, FNP  Water  For Irrigation, Sterile (STERILE WATER ) Irrigate with 50 mLs as directed daily. Irrigate catheter daily with 50cc of sterile water . 12/23/23   [name], Belvie CROME, MD    Allergies: Patient has no known allergies.    Review of Systems  Constitutional:  Negative for fever.  Respiratory:  Negative for shortness of breath.   Cardiovascular:  Negative for chest pain.  Gastrointestinal:  Negative for abdominal pain, nausea and vomiting.  Genitourinary:  Positive for difficulty urinating.  Skin:  Negative  for rash.    Updated Vital Signs BP 102/78 (BP Location: Left Arm)   Pulse 85   Temp 97.9 F (36.6 C) (Oral)   Resp 15   Ht 5' 3 (1.6 m)   Wt 50.5 kg   SpO2 100%   BMI 19.72 kg/m   Physical Exam Vitals and nursing note reviewed.  Constitutional:      General: She is not in acute distress.    Appearance: Normal appearance. She is well-developed.  HENT:     Head: Normocephalic and atraumatic.  Eyes:     Conjunctiva/sclera: Conjunctivae  normal.  Cardiovascular:     Rate and Rhythm: Normal rate and regular rhythm.     Heart sounds: No murmur heard. Pulmonary:     Effort: Pulmonary effort is normal. No respiratory distress.     Breath sounds: Normal breath sounds. No stridor. No wheezing.  Abdominal:     Palpations: Abdomen is soft.     Tenderness: There is no abdominal tenderness. There is no guarding or rebound.     Comments: Suprapubic catheter in place.  Musculoskeletal:     Cervical back: Neck supple.  Skin:    General: Skin is warm and dry.  Neurological:     Mental Status: She is alert.     GCS: GCS eye subscore is 4. GCS verbal subscore is 5. GCS motor subscore is 6.     Comments: She is awake and alert.  Minimal use of her lower extremities.  Mildly weak upper extremities     (all labs ordered are listed, but only abnormal results are displayed) Labs Reviewed  URINE CULTURE    EKG: None  Radiology: No results found.   SUPRAPUBIC TUBE PLACEMENT  Date/Time: 01/04/2024 1:54 PM  Performed by: Towana Ozell BROCKS, MD Authorized by: Towana Ozell BROCKS, MD   Consent:    Consent obtained:  Verbal   Consent given by:  Patient Universal protocol:    Procedure explained and questions answered to patient or proxy's satisfaction: yes     Patient identity confirmed:  Verbally with patient Sedation:    Sedation type:  None Anesthesia:    Anesthesia method:  None Procedure details:    Complexity:  Simple   Catheter type:  Foley   Catheter size:  22 Fr   Ultrasound guidance: no     Number of attempts:  1   Urine characteristics:  Cloudy Post-procedure details:    Procedure completion:  Tolerated well, no immediate complications    Medications Ordered in the ED - No data to display  Clinical Course as of 01/04/24 1354  Wed Jan 04, 2024  1354 Suprapubic catheter placed without any difficulty.  Cloudy urine draining. [MB]    Clinical Course User Index [MB] Towana Ozell BROCKS, MD                                  Medical Decision Making Amount and/or Complexity of Data Reviewed Labs: ordered.   This patient complains of blocked suprapubic catheter; this involves an extensive number of treatment Options and is a complaint that carries with it a high risk of complications and morbidity. The differential includes suprapubic catheter malposition, clogged catheter, infection  I ordered, reviewed and interpreted labs, which included urine culture sent Previous records obtained and reviewed in epic, frequent ED visits for similar Social determinants considered, no significant barriers Critical Interventions: None  After the interventions  stated above, I reevaluated the patient and found patient to be urinating easily and having no pain Admission and further testing considered, she will be returned back home.  Recommended close follow-up with urology.  Return instructions discussed      Final diagnoses:  Suprapubic catheter dysfunction, initial encounter    ED Discharge Orders     None          Towana Ozell BROCKS, MD 01/04/24 [phone]

## 2024-01-04 NOTE — ED Triage Notes (Signed)
 Suprapubic catheter clogged x 2 days . Pt arrived [facility] from home.

## 2024-01-04 NOTE — ED Notes (Signed)
 Pt resting. Nad. A/o. Awaiting ems to take back home

## 2024-01-04 NOTE — ED Notes (Signed)
 [facility] called to transport patient home. Nurse aware.

## 2024-01-04 NOTE — ED Notes (Signed)
 Pt given drink and sandwhich.

## 2024-01-05 NOTE — Telephone Encounter (Signed)
 Phone room: please call pt back. She should call [facility]  at [phone]. They will be able to let her know where things are at and what pharmacy rx was sent to

## 2024-01-05 NOTE — Telephone Encounter (Signed)
 Patient called to get phone number: [phone]

## 2024-01-05 NOTE — Telephone Encounter (Signed)
 Pt called to Follow up about Medication  KESIMPTA  20 MG/0.4ML SOAJ  , PT wanted  to know what Pharmacy medication is going to ,

## 2024-01-06 MED ORDER — KESIMPTA 20 MG/0.4ML ~~LOC~~ SOAJ: SUBCUTANEOUS | 0 refills | Status: DC

## 2024-01-06 MED ORDER — KESIMPTA 20 MG/0.4ML ~~LOC~~ SOAJ
0.4000 mL | SUBCUTANEOUS | 11 refills | Status: AC
Start: 2024-01-06 — End: ?

## 2024-01-06 NOTE — Addendum Note (Signed)
 Addended by: JOSHUA MAURILIO CROME on: 01/06/2024 09:28 AM   Modules accepted: Orders

## 2024-01-06 NOTE — Telephone Encounter (Signed)
 Phone room: please call Harlene back and let her know pt will be starting and rx's sent to [facility] specialty pharmacy today (see below)

## 2024-01-06 NOTE — Telephone Encounter (Signed)
 Received the following response below. Rx's sent to [facility] as requested.

## 2024-01-06 NOTE — Telephone Encounter (Signed)
 Harlene from [facility]  [phone]) called wanting to make sure that the pt was starting back up on the medication. She stated that [facility] pharmacy will be needing a new Rx sent to them at [phone].

## 2024-01-06 NOTE — Telephone Encounter (Signed)
    Replied asking if loading/maintenance dose both need to be sent, waiting on response.

## 2024-01-07 LAB — URINE CULTURE: Culture: 40000 — AB

## 2024-01-08 NOTE — Progress Notes (Signed)
 ED Antimicrobial Stewardship Positive Culture Follow Up   Melissa West is an 33 y.o. female who presented to [facility] on @ADMITDT @ with a chief complaint of No chief complaint on file.   Recent Results (from the past 720 hours)  Urine Culture     Status: Abnormal   Collection Time: 01/04/24  2:10 PM   Specimen: Urine, Suprapubic  Result Value Ref Range Status   Specimen Description   Final    URINE, SUPRAPUBIC Performed at [facility], [address], [place], [place] [address]    Special Requests   Final    NONE Performed at [facility], [address], [place], [place] [address]    Culture (A)  Final    40,000 COLONIES/mL PROTEUS MIRABILIS >=100,000 COLONIES/mL STAPHYLOCOCCUS AUREUS METHICILLIN RESISTANT STAPHYLOCOCCUS AUREUS 40,000 COLONIES/mL MORGANELLA MORGANII    Report Status 01/07/2024 FINAL  Final   Organism ID, Bacteria PROTEUS MIRABILIS (A)  Final   Organism ID, Bacteria STAPHYLOCOCCUS AUREUS (A)  Final   Organism ID, Bacteria METHICILLIN RESISTANT STAPHYLOCOCCUS AUREUS  Final   Organism ID, Bacteria MORGANELLA MORGANII (A)  Final      Susceptibility   Morganella morganii - MIC*    AMPICILLIN >=32 RESISTANT Resistant     ERTAPENEM <=0.12 SENSITIVE Sensitive     CIPROFLOXACIN  1 RESISTANT Resistant     GENTAMICIN  8 INTERMEDIATE Intermediate     NITROFURANTOIN  RESISTANT Resistant     TRIMETH /SULFA  <=20 SENSITIVE Sensitive     AMPICILLIN/SULBACTAM >=32 RESISTANT Resistant     PIP/TAZO Value in next row Sensitive      <=4 SENSITIVEThis is a modified FDA-approved test that has been validated and its performance characteristics determined by the reporting laboratory.  This laboratory is certified under the Clinical Laboratory Improvement Amendments CLIA as qualified to perform high complexity clinical laboratory testing.    MEROPENEM Value in next row Sensitive      <=4 SENSITIVEThis is a modified FDA-approved test that has been validated and its performance  characteristics determined by the reporting laboratory.  This laboratory is certified under the Clinical Laboratory Improvement Amendments CLIA as qualified to perform high complexity clinical laboratory testing.    * 40,000 COLONIES/mL MORGANELLA MORGANII   Methicillin resistant staphylococcus aureus - MIC*    AMPICILLIN Value in next row Resistant      <=4 SENSITIVEThis is a modified FDA-approved test that has been validated and its performance characteristics determined by the reporting laboratory.  This laboratory is certified under the Clinical Laboratory Improvement Amendments CLIA as qualified to perform high complexity clinical laboratory testing.    ERTAPENEM Value in next row Sensitive      <=4 SENSITIVEThis is a modified FDA-approved test that has been validated and its performance characteristics determined by the reporting laboratory.  This laboratory is certified under the Clinical Laboratory Improvement Amendments CLIA as qualified to perform high complexity clinical laboratory testing.    CIPROFLOXACIN  Value in next row Resistant      <=4 SENSITIVEThis is a modified FDA-approved test that has been validated and its performance characteristics determined by the reporting laboratory.  This laboratory is certified under the Clinical Laboratory Improvement Amendments CLIA as qualified to perform high complexity clinical laboratory testing.    GENTAMICIN  Value in next row Intermediate      <=4 SENSITIVEThis is a modified FDA-approved test that has been validated and its performance characteristics determined by the reporting laboratory.  This laboratory is certified under the Clinical Laboratory Improvement Amendments CLIA as qualified to  perform high complexity clinical laboratory testing.    NITROFURANTOIN  Value in next row Resistant      <=4 SENSITIVEThis is a modified FDA-approved test that has been validated and its performance characteristics determined by the reporting laboratory.  This  laboratory is certified under the Clinical Laboratory Improvement Amendments CLIA as qualified to perform high complexity clinical laboratory testing.    TRIMETH /SULFA  Value in next row Sensitive      <=4 SENSITIVEThis is a modified FDA-approved test that has been validated and its performance characteristics determined by the reporting laboratory.  This laboratory is certified under the Clinical Laboratory Improvement Amendments CLIA as qualified to perform high complexity clinical laboratory testing.    AMPICILLIN/SULBACTAM Value in next row Resistant      <=4 SENSITIVEThis is a modified FDA-approved test that has been validated and its performance characteristics determined by the reporting laboratory.  This laboratory is certified under the Clinical Laboratory Improvement Amendments CLIA as qualified to perform high complexity clinical laboratory testing.    PIP/TAZO Value in next row Sensitive      <=4 SENSITIVEThis is a modified FDA-approved test that has been validated and its performance characteristics determined by the reporting laboratory.  This laboratory is certified under the Clinical Laboratory Improvement Amendments CLIA as qualified to perform high complexity clinical laboratory testing.    MEROPENEM Value in next row Sensitive      <=4 SENSITIVEThis is a modified FDA-approved test that has been validated and its performance characteristics determined by the reporting laboratory.  This laboratory is certified under the Clinical Laboratory Improvement Amendments CLIA as qualified to perform high complexity clinical laboratory testing.    * METHICILLIN RESISTANT STAPHYLOCOCCUS AUREUS   Proteus mirabilis - MIC*    AMPICILLIN Value in next row Sensitive      <=4 SENSITIVEThis is a modified FDA-approved test that has been validated and its performance characteristics determined by the reporting laboratory.  This laboratory is certified under the Clinical Laboratory Improvement Amendments CLIA  as qualified to perform high complexity clinical laboratory testing.    CEFAZOLIN  (URINE) Value in next row Sensitive      4 SENSITIVEThis is a modified FDA-approved test that has been validated and its performance characteristics determined by the reporting laboratory.  This laboratory is certified under the Clinical Laboratory Improvement Amendments CLIA as qualified to perform high complexity clinical laboratory testing.    CEFEPIME  Value in next row Sensitive      4 SENSITIVEThis is a modified FDA-approved test that has been validated and its performance characteristics determined by the reporting laboratory.  This laboratory is certified under the Clinical Laboratory Improvement Amendments CLIA as qualified to perform high complexity clinical laboratory testing.    ERTAPENEM Value in next row Sensitive      4 SENSITIVEThis is a modified FDA-approved test that has been validated and its performance characteristics determined by the reporting laboratory.  This laboratory is certified under the Clinical Laboratory Improvement Amendments CLIA as qualified to perform high complexity clinical laboratory testing.    CEFTRIAXONE  Value in next row Sensitive      4 SENSITIVEThis is a modified FDA-approved test that has been validated and its performance characteristics determined by the reporting laboratory.  This laboratory is certified under the Clinical Laboratory Improvement Amendments CLIA as qualified to perform high complexity clinical laboratory testing.    CIPROFLOXACIN  Value in next row Sensitive      4 SENSITIVEThis is a modified FDA-approved test that has been validated and its  performance characteristics determined by the reporting laboratory.  This laboratory is certified under the Clinical Laboratory Improvement Amendments CLIA as qualified to perform high complexity clinical laboratory testing.    GENTAMICIN  Value in next row Sensitive      4 SENSITIVEThis is a modified FDA-approved test that  has been validated and its performance characteristics determined by the reporting laboratory.  This laboratory is certified under the Clinical Laboratory Improvement Amendments CLIA as qualified to perform high complexity clinical laboratory testing.    NITROFURANTOIN  Value in next row Resistant      4 SENSITIVEThis is a modified FDA-approved test that has been validated and its performance characteristics determined by the reporting laboratory.  This laboratory is certified under the Clinical Laboratory Improvement Amendments CLIA as qualified to perform high complexity clinical laboratory testing.    TRIMETH /SULFA  Value in next row Resistant      4 SENSITIVEThis is a modified FDA-approved test that has been validated and its performance characteristics determined by the reporting laboratory.  This laboratory is certified under the Clinical Laboratory Improvement Amendments CLIA as qualified to perform high complexity clinical laboratory testing.    AMPICILLIN/SULBACTAM Value in next row Sensitive      4 SENSITIVEThis is a modified FDA-approved test that has been validated and its performance characteristics determined by the reporting laboratory.  This laboratory is certified under the Clinical Laboratory Improvement Amendments CLIA as qualified to perform high complexity clinical laboratory testing.    PIP/TAZO Value in next row Sensitive      <=4 SENSITIVEThis is a modified FDA-approved test that has been validated and its performance characteristics determined by the reporting laboratory.  This laboratory is certified under the Clinical Laboratory Improvement Amendments CLIA as qualified to perform high complexity clinical laboratory testing.    MEROPENEM Value in next row Sensitive      <=4 SENSITIVEThis is a modified FDA-approved test that has been validated and its performance characteristics determined by the reporting laboratory.  This laboratory is certified under the Clinical Laboratory  Improvement Amendments CLIA as qualified to perform high complexity clinical laboratory testing.    * 40,000 COLONIES/mL PROTEUS MIRABILIS   Staphylococcus aureus - MIC*    CIPROFLOXACIN  Value in next row Resistant      <=4 SENSITIVEThis is a modified FDA-approved test that has been validated and its performance characteristics determined by the reporting laboratory.  This laboratory is certified under the Clinical Laboratory Improvement Amendments CLIA as qualified to perform high complexity clinical laboratory testing.    GENTAMICIN  Value in next row Sensitive      <=4 SENSITIVEThis is a modified FDA-approved test that has been validated and its performance characteristics determined by the reporting laboratory.  This laboratory is certified under the Clinical Laboratory Improvement Amendments CLIA as qualified to perform high complexity clinical laboratory testing.    NITROFURANTOIN  Value in next row Sensitive      <=4 SENSITIVEThis is a modified FDA-approved test that has been validated and its performance characteristics determined by the reporting laboratory.  This laboratory is certified under the Clinical Laboratory Improvement Amendments CLIA as qualified to perform high complexity clinical laboratory testing.    OXACILLIN Value in next row Resistant      <=4 SENSITIVEThis is a modified FDA-approved test that has been validated and its performance characteristics determined by the reporting laboratory.  This laboratory is certified under the Clinical Laboratory Improvement Amendments CLIA as qualified to perform high complexity clinical laboratory testing.    TETRACYCLINE Value in  next row Sensitive      <=4 SENSITIVEThis is a modified FDA-approved test that has been validated and its performance characteristics determined by the reporting laboratory.  This laboratory is certified under the Clinical Laboratory Improvement Amendments CLIA as qualified to perform high complexity clinical  laboratory testing.    VANCOMYCIN Value in next row Sensitive      <=4 SENSITIVEThis is a modified FDA-approved test that has been validated and its performance characteristics determined by the reporting laboratory.  This laboratory is certified under the Clinical Laboratory Improvement Amendments CLIA as qualified to perform high complexity clinical laboratory testing.    TRIMETH /SULFA  Value in next row Resistant      <=4 SENSITIVEThis is a modified FDA-approved test that has been validated and its performance characteristics determined by the reporting laboratory.  This laboratory is certified under the Clinical Laboratory Improvement Amendments CLIA as qualified to perform high complexity clinical laboratory testing.    RIFAMPIN Value in next row Sensitive      <=4 SENSITIVEThis is a modified FDA-approved test that has been validated and its performance characteristics determined by the reporting laboratory.  This laboratory is certified under the Clinical Laboratory Improvement Amendments CLIA as qualified to perform high complexity clinical laboratory testing.    Inducible Clindamycin  Value in next row Sensitive      <=4 SENSITIVEThis is a modified FDA-approved test that has been validated and its performance characteristics determined by the reporting laboratory.  This laboratory is certified under the Clinical Laboratory Improvement Amendments CLIA as qualified to perform high complexity clinical laboratory testing.    LINEZOLID Value in next row Sensitive      <=4 SENSITIVEThis is a modified FDA-approved test that has been validated and its performance characteristics determined by the reporting laboratory.  This laboratory is certified under the Clinical Laboratory Improvement Amendments CLIA as qualified to perform high complexity clinical laboratory testing.    * >=100,000 COLONIES/mL STAPHYLOCOCCUS AUREUS    [x]  Please f/u with patient and see if having symptoms of UTI. Proteus in culture  above is likely colonization.  No Symptoms: F/u with urology Yes Symptoms: Start both prescriptions below: Bactrim  1 DS tablet twice daily for 7 days (qty 14; refills 0) Macrobid  100mg  BID x 5 days (wty 10; refills 0)  ED Provider: Bernardino Melvenia Dorn Job, PharmD, BCPS 01/08/2024 9:57 AM ED Clinical Pharmacist -  [phone]

## 2024-01-09 ENCOUNTER — Telehealth (HOSPITAL_BASED_OUTPATIENT_CLINIC_OR_DEPARTMENT_OTHER): Payer: Self-pay | Admitting: *Deleted

## 2024-01-09 ENCOUNTER — Telehealth (HOSPITAL_BASED_OUTPATIENT_CLINIC_OR_DEPARTMENT_OTHER): Payer: Self-pay | Admitting: Emergency Medicine

## 2024-01-09 NOTE — Telephone Encounter (Signed)
 Post ED Visit - Positive Culture Follow-up: Unsuccessful Patient Follow-up  Culture assessed and recommendations reviewed by:  [x]  Dorn Buttner, Pharm.D. []  Venetia Gully, Pharm.D., BCPS AQ-ID []  Garrel Crews, Pharm.D., BCPS []  Almarie Lunger, Pharm.D., BCPS []  [place], [address].D., BCPS, AAHIVP []  Rosaline Bihari, Pharm.D., BCPS, AAHIVP []  Massie Rigg, PharmD []  Jodie Rower, PharmD, BCPS  Positive urine culture  [x]  Patient discharged without antimicrobial prescription and treatment is now indicated []  Organism is resistant to prescribed ED discharge antimicrobial []  Patient with positive blood cultures  Plan: if having UTI symptoms, start Bactrim  DS 1 tab po BID x 7 days and Macrobid  100 mg po BID x 5 days, per Dr. Bernardino Fireman, EDP.  Unable to contact patient after 3 attempts, letter will be sent to address on file  Lorita Barnie Pereyra 01/09/2024, 11:27 AM

## 2024-01-11 ENCOUNTER — Emergency Department (HOSPITAL_COMMUNITY)
Admission: EM | Admit: 2024-01-11 | Discharge: 2024-01-11 | Disposition: A | Discharge status: Home / Self Care | Attending: Emergency Medicine | Admitting: Emergency Medicine

## 2024-01-11 ENCOUNTER — Encounter (HOSPITAL_COMMUNITY): Payer: Self-pay | Admitting: *Deleted

## 2024-01-11 ENCOUNTER — Other Ambulatory Visit: Payer: Self-pay

## 2024-01-11 DIAGNOSIS — T83511A Infection and inflammatory reaction due to indwelling urethral catheter, initial encounter: Secondary | ICD-10-CM | POA: Diagnosis not present

## 2024-01-11 DIAGNOSIS — T83098A Other mechanical complication of other indwelling urethral catheter, initial encounter: Secondary | ICD-10-CM | POA: Diagnosis not present

## 2024-01-11 DIAGNOSIS — N390 Urinary tract infection, site not specified: Secondary | ICD-10-CM

## 2024-01-11 DIAGNOSIS — Z7401 Bed confinement status: Secondary | ICD-10-CM | POA: Diagnosis not present

## 2024-01-11 DIAGNOSIS — Y732 Prosthetic and other implants, materials and accessory gastroenterology and urology devices associated with adverse incidents: Secondary | ICD-10-CM | POA: Diagnosis not present

## 2024-01-11 DIAGNOSIS — R531 Weakness: Secondary | ICD-10-CM | POA: Diagnosis not present

## 2024-01-11 DIAGNOSIS — T83010A Breakdown (mechanical) of cystostomy catheter, initial encounter: Secondary | ICD-10-CM

## 2024-01-11 LAB — CBC WITH DIFFERENTIAL/PLATELET
Abs Immature Granulocytes: 0.01 K/uL (ref 0.00–0.07)
Basophils Absolute: 0 K/uL (ref 0.0–0.1)
Basophils Relative: 0 %
Eosinophils Absolute: 0.1 K/uL (ref 0.0–0.5)
Eosinophils Relative: 2 %
HCT: 37.7 % (ref 36.0–46.0)
Hemoglobin: 11.8 g/dL — ABNORMAL LOW (ref 12.0–15.0)
Immature Granulocytes: 0 %
Lymphocytes Relative: 47 %
Lymphs Abs: 2.2 K/uL (ref 0.7–4.0)
MCH: 26.4 pg (ref 26.0–34.0)
MCHC: 31.3 g/dL (ref 30.0–36.0)
MCV: 84.3 fL (ref 80.0–100.0)
Monocytes Absolute: 0.3 K/uL (ref 0.1–1.0)
Monocytes Relative: 6 %
Neutro Abs: 2.2 K/uL (ref 1.7–7.7)
Neutrophils Relative %: 45 %
Platelets: 197 K/uL (ref 150–400)
RBC: 4.47 MIL/uL (ref 3.87–5.11)
RDW: 16.6 % — ABNORMAL HIGH (ref 11.5–15.5)
WBC: 4.8 K/uL (ref 4.0–10.5)
nRBC: 0 % (ref 0.0–0.2)

## 2024-01-11 LAB — URINALYSIS, W/ REFLEX TO CULTURE (INFECTION SUSPECTED)
Bilirubin Urine: NEGATIVE
Glucose, UA: NEGATIVE mg/dL
Ketones, ur: NEGATIVE mg/dL
Nitrite: NEGATIVE
Protein, ur: 100 mg/dL — AB
RBC / HPF: >50 RBC/hpf (ref 0–5)
Specific Gravity, Urine: 1.016 (ref 1.005–1.030)
WBC, UA: >50 WBC/hpf (ref 0–5)
pH: 6 (ref 5.0–8.0)

## 2024-01-11 LAB — BASIC METABOLIC PANEL WITH GFR
Anion gap: 12 (ref 5–15)
BUN: 16 mg/dL (ref 6–20)
CO2: 19 mmol/L — ABNORMAL LOW (ref 22–32)
Calcium: 8.8 mg/dL — ABNORMAL LOW (ref 8.9–10.3)
Chloride: 106 mmol/L (ref 98–111)
Creatinine, Ser: 0.79 mg/dL (ref 0.44–1.00)
GFR, Estimated: >60 mL/min (ref >60)
Glucose, Bld: 73 mg/dL (ref 70–99)
Potassium: 4.1 mmol/L (ref 3.5–5.1)
Sodium: 137 mmol/L (ref 135–145)

## 2024-01-11 MED ORDER — SULFAMETHOXAZOLE-TRIMETHOPRIM 800-160 MG PO TABS: 1.0000 | ORAL_TABLET | Freq: Two times a day (BID) | ORAL | 0 refills | Status: AC

## 2024-01-11 MED ORDER — SULFAMETHOXAZOLE-TRIMETHOPRIM 800-160 MG PO TABS
1.0000 | ORAL_TABLET | Freq: Once | ORAL | Status: AC
  Administered 2024-01-11: 1 via ORAL
  Filled 2024-01-11: qty 1

## 2024-01-11 MED ORDER — NITROFURANTOIN MONOHYD MACRO 100 MG PO CAPS
100.0000 mg | ORAL_CAPSULE | Freq: Once | ORAL | Status: AC
  Administered 2024-01-11: 100 mg via ORAL
  Filled 2024-01-11: qty 1

## 2024-01-11 MED ORDER — SODIUM CHLORIDE 0.9 % IV BOLUS
1000.0000 mL | Freq: Once | INTRAVENOUS | Status: AC
  Administered 2024-01-11: 1000 mL via INTRAVENOUS

## 2024-01-11 MED ORDER — NITROFURANTOIN MONOHYD MACRO 100 MG PO CAPS: 100.0000 mg | ORAL_CAPSULE | Freq: Two times a day (BID) | ORAL | 0 refills | Status: DC

## 2024-01-11 NOTE — Telephone Encounter (Signed)
 Took call from phone room/Jael and spoke w/ mother (on [place]). Relayed instructions on how to take loading dose [facility]  and then how to take maintenance dose thereafter. They will call back if any further questions arise. She plans to start today.

## 2024-01-11 NOTE — ED Provider Notes (Signed)
 [place] EMERGENCY DEPARTMENT AT [facility] Provider Note   [name]: [id number] Arrival date & time: 01/11/24  1714     Patient presents with: No chief complaint on file.   Melissa West is a 33 y.o. female.   Pt is a 33 yo female with pmhx significant for MS and neurogenic bladder with a suprapubic catheter.  She was here on 9/24 for a clogged suprapubic catheter.  Urine was sent for culture and did grow out:  40,000 COLONIES/mL PROTEUS MIRABILIS  >=100,000 COLONIES/mL STAPHYLOCOCCUS AUREUS  METHICILLIN RESISTANT STAPHYLOCOCCUS AUREUS  40,000 COLONIES/mL MORGANELLA MORGANII            Prior to Admission medications   Medication Sig Start Date End Date Taking? Authorizing Provider  nitrofurantoin , macrocrystal-monohydrate, (MACROBID ) 100 MG capsule Take 1 capsule (100 mg total) by mouth 2 (two) times daily. 01/11/24  Yes Dean Clarity, MD  sulfamethoxazole -trimethoprim  (BACTRIM  DS) 800-160 MG tablet Take 1 tablet by mouth 2 (two) times daily for 7 days. 01/11/24 01/18/24 Yes Dean Clarity, MD  acetaminophen  (TYLENOL ) 325 MG tablet Take 2 tablets (650 mg total) by mouth every 6 (six) hours as needed for mild pain (or Fever >/= 101). Patient not taking: Reported on 12/13/2023 05/20/21   Pearlean Manus, MD  acetic acid  0.25 % irrigation IRRIGATE WITH AS DIRECTED DAILY Patient not taking: Reported on 12/13/2023 03/22/23   Sherrilee Belvie CROME, MD  amantadine  (SYMMETREL ) 100 MG capsule Take 1 capsule (100 mg total) by mouth 2 (two) times daily. Patient not taking: Reported on 12/13/2023 01/26/23   Sater, Charlie LABOR, MD  AMBULATORY NON FORMULARY MEDICATION 30 mLs by Intracatheter route daily as needed. Medication Name: Sodium Chloride  0.9% Patient not taking: Reported on 12/13/2023 11/12/21   Sherrilee Belvie CROME, MD  AMBULATORY NON FORMULARY MEDICATION Medication Name: Bedside catheter bag Patient not taking: Reported on 12/13/2023 08/12/22   Watt Rush, MD  baclofen  (LIORESAL )  10 MG tablet Take 1 tablet (10 mg total) by mouth 3 (three) times daily. Patient not taking: Reported on 12/13/2023 10/18/22   Vear Charlie LABOR, MD  ciprofloxacin  (CIPRO ) 500 MG tablet Take 1 tablet (500 mg total) by mouth every 12 (twelve) hours. Patient not taking: Reported on 12/13/2023 05/18/23   Francis Ileana SAILOR, PA-C  KESIMPTA  20 MG/0.4ML SOAJ 0.4ml into the skin at week 0, 1 and 2. Then start on maintenance dose at week 4. 01/06/24   Sater, Charlie LABOR, MD  KESIMPTA  20 MG/0.4ML SOAJ Inject 0.4 mLs into the skin every 30 (thirty) days. Starting at week 4 01/06/24   Sater, Charlie LABOR, MD  meclizine  (ANTIVERT ) 25 MG tablet Take 1 tablet (25 mg total) by mouth 3 (three) times daily as needed for dizziness. Patient not taking: Reported on 12/13/2023 12/29/22   Zammit, Joseph, MD  metoCLOPramide  (REGLAN ) 10 MG tablet Take 1 tablet (10 mg total) by mouth every 6 (six) hours. Patient not taking: Reported on 12/13/2023 04/21/23   Yolande Lamar BROCKS, MD  Skin Protectants, Misc. (PERIGUARD) OINT Apply topically Patient not taking: Reported on 12/13/2023 05/26/23   Edman Meade PEDLAR, FNP  Syringe, Disposable, (50-60CC SYRINGE) 60 ML MISC Flush 30-50cc of sterile water  into catheter daily as needed 12/23/23   Sherrilee Belvie CROME, MD  Vitamin D , Ergocalciferol , (DRISDOL ) 1.25 MG (50000 UNIT) CAPS capsule Take 1 capsule (50,000 Units total) by mouth every 7 (seven) days. Patient not taking: Reported on 12/13/2023 06/25/23   Edman Meade PEDLAR, FNP  Water  For Irrigation, Sterile (STERILE WATER ) Irrigate  with 50 mLs as directed daily. Irrigate catheter daily with 50cc of sterile water . 12/23/23   [name], Belvie CROME, MD    Allergies: Patient has no known allergies.    Review of Systems  Genitourinary:  Positive for difficulty urinating.  All other systems reviewed and are negative.   Updated Vital Signs BP 106/87   Pulse (!) 58   Temp 97.7 F (36.5 C) (Temporal)   Resp 17   Ht 5' 3 (1.6 m)   Wt 55.3 kg   LMP 01/04/2024  (Approximate)   SpO2 100%   BMI 21.61 kg/m   Physical Exam Vitals and nursing note reviewed.  HENT:     Head: Normocephalic and atraumatic.     Comments: Disconjugate gaze (chronic)    Right Ear: External ear normal.     Left Ear: External ear normal.     Nose: Nose normal.     Mouth/Throat:     Mouth: Mucous membranes are dry.  Eyes:     Pupils: Pupils are equal, round, and reactive to light.  Cardiovascular:     Rate and Rhythm: Normal rate and regular rhythm.     Pulses: Normal pulses.     Heart sounds: Normal heart sounds.  Pulmonary:     Effort: Pulmonary effort is normal.     Breath sounds: Normal breath sounds.  Abdominal:     General: Abdomen is flat. Bowel sounds are normal.     Palpations: Abdomen is soft.     Comments: Suprapubic tenderness; catheter noted  Musculoskeletal:     Cervical back: Normal range of motion and neck supple.  Skin:    General: Skin is warm.     Capillary Refill: Capillary refill takes less than 2 seconds.  Neurological:     Mental Status: She is alert and oriented to person, place, and time. Mental status is at baseline.     Comments: Spastic quadraplegia  Psychiatric:        Mood and Affect: Mood normal.        Behavior: Behavior normal.     (all labs ordered are listed, but only abnormal results are displayed) Labs Reviewed  URINALYSIS, W/ REFLEX TO CULTURE (INFECTION SUSPECTED) - Abnormal; Notable for the following components:      Result Value   APPearance CLOUDY (*)    Hgb urine dipstick LARGE (*)    Protein, ur 100 (*)    Leukocytes,Ua MODERATE (*)    Bacteria, UA RARE (*)    All other components within normal limits  CBC WITH DIFFERENTIAL/PLATELET - Abnormal; Notable for the following components:   Hemoglobin 11.8 (*)    RDW 16.6 (*)    All other components within normal limits  BASIC METABOLIC PANEL WITH GFR - Abnormal; Notable for the following components:   CO2 19 (*)    Calcium  8.8 (*)    All other components  within normal limits  URINE CULTURE    EKG: None  Radiology: No results found.   BLADDER CATHETERIZATION  Date/Time: 01/11/2024 10:12 PM  Performed by: Dean Clarity, MD Authorized by: Dean Clarity, MD   Consent:    Consent obtained:  Verbal   Consent given by:  Patient   Alternatives discussed:  No treatment Universal protocol:    Patient identity confirmed:  Verbally with patient Pre-procedure details:    Preparation: Patient was prepped and draped in usual sterile fashion   Anesthesia:    Anesthesia method:  None Procedure details:    Provider  performed due to:  Complicated insertion, altered anatomy and nurse unable to complete   Catheter insertion:  Indwelling   Catheter size:  22 Fr   Bladder irrigation: no     Number of attempts:  1   Urine characteristics:  Cloudy Post-procedure details:    Procedure completion:  Tolerated well, no immediate complications Comments:     Suprapubic catheter exchange    Medications Ordered in the ED  sodium chloride  0.9 % bolus 1,000 mL (1,000 mLs Intravenous Bolus 01/11/24 1937)  sulfamethoxazole -trimethoprim  (BACTRIM  DS) 800-160 MG per tablet 1 tablet (1 tablet Oral Given 01/11/24 2148)  nitrofurantoin  (macrocrystal-monohydrate) (MACROBID ) capsule 100 mg (100 mg Oral Given 01/11/24 2149)                                    Medical Decision Making Amount and/or Complexity of Data Reviewed Labs: ordered.  Risk Prescription drug management.   This patient presents to the ED for concern of catheter dysfunction, this involves an extensive number of treatment options, and is a complaint that carries with it a high risk of complications and morbidity.  The differential diagnosis includes clogged, infection   Co morbidities that complicate the patient evaluation  MS and neurogenic bladder   Additional history obtained:  Additional history obtained from epic chart review External records from outside source obtained  and reviewed including EMS report   Lab Tests:  I Ordered, and personally interpreted labs.  The pertinent results include:  cbc with hgb sl low at 11.8, bmp nl; ua + for uti   Medicines ordered and prescription drug management:  I ordered medication including macrobid /bactrim   for sx  Reevaluation of the patient after these medicines showed that the patient improved I have reviewed the patients home medicines and have made adjustments as needed   Critical Interventions:  Catheter exchange/abx   Problem List / ED Course:  Suprapubic catheter dysfunction:  nurse tried irrigating, but it would not irrigate or drain, so I changed it without any difficulty.  Dysfunction likely due to uti.  Pt started on bactrim /macrobid .  She is stable for d/c.  Return if worse.    Reevaluation:  After the interventions noted above, I reevaluated the patient and found that they have :improved   Social Determinants of Health:  Lives at home   Dispostion:  After consideration of the diagnostic results and the patients response to treatment, I feel that the patent would benefit from discharge with outpatient f/u.       Final diagnoses:  Urinary tract infection associated with indwelling urethral catheter, initial encounter  Suprapubic catheter dysfunction, initial encounter    ED Discharge Orders          Ordered    sulfamethoxazole -trimethoprim  (BACTRIM  DS) 800-160 MG tablet  2 times daily        01/11/24 2145    nitrofurantoin , macrocrystal-monohydrate, (MACROBID ) 100 MG capsule  2 times daily        01/11/24 2145               Donnalyn Juran, MD 01/11/24 2215

## 2024-01-11 NOTE — ED Triage Notes (Signed)
 Pt BIB [facility] for clogged suprapubic catheter.

## 2024-01-13 LAB — URINE CULTURE

## 2024-01-18 NOTE — Telephone Encounter (Signed)
 I called patient. She reports that she received the Kesimpta  and is tolerating the injections well. She denies having questions or concerns at this time but will let us  know if they arise.

## 2024-01-19 ENCOUNTER — Ambulatory Visit: Admitting: Family Medicine

## 2024-01-19 ENCOUNTER — Encounter: Payer: Self-pay | Admitting: Family Medicine

## 2024-01-19 VITALS — BP 119/80 | HR 105 | Resp 16 | Ht 63.0 in

## 2024-01-19 DIAGNOSIS — N3000 Acute cystitis without hematuria: Secondary | ICD-10-CM | POA: Diagnosis not present

## 2024-01-19 NOTE — Assessment & Plan Note (Signed)
-  Encouraged to return a urine sample to the clinic at her earliest convenience to assess for UTI clearance.  -Continue to monitor for symptoms such as burning with urination, urinary frequency or urgency, lower abdominal pain, fever, or blood in the urine, and report any of these if they occur.  Discussed catheter care to prevent UTI -Wash hands before and after touching the catheter or drainage bag. -Clean the area around the catheter daily with mild soap and water . -Keep the drainage bag below bladder level and avoid letting it touch the floor. -Empty the bag when two-thirds full using a clean container. -Make sure tubing is not kinked or twisted so urine flows freely. -Drink plenty of water  (unless told otherwise by your provider). -Call the clinic if you notice fever, chills, cloudy or foul-smelling urine, pain, or little urine output.

## 2024-01-19 NOTE — Progress Notes (Signed)
 Established Patient Office Visit  Subjective:  Patient ID: Melissa West, female    DOB: 06/14/1990  Age: 33 y.o. MRN: [id number]  CC:  Chief Complaint  Patient presents with   Medical Management of Chronic Issues    6 month follow up    HPI Melissa West is a 33 y.o. female with a past medical history of wheelchair dependence, lower extremity weakness, and neurogenic bladder, presents for evaluation of a urinary tract infection (UTI). She reports having a few days left of her Bactrim  prescription and is currently asymptomatic. She denies fever, chills, dysuria, hematuria, or flank pain.  Past Medical History:  Diagnosis Date   MS (multiple sclerosis)    No pertinent past medical history     Past Surgical History:  Procedure Laterality Date   CESAREAN SECTION  08/09/2011   Procedure: CESAREAN SECTION;  Surgeon: Burnard VEAR Pate, MD;  Location: [facility];  Service: Gynecology;  Laterality: N/A;   IR CATHETER TUBE CHANGE  07/22/2021   IR CATHETER TUBE CHANGE  09/10/2021    Family History  Problem Relation Age of Onset   Diabetes Maternal Grandmother    Hypertension Maternal Grandmother    Kidney disease Maternal Grandmother     Social History   Socioeconomic History   Marital status: Single    Spouse name: Not on file   Number of children: Not on file   Years of education: Not on file   Highest education level: Not on file  Occupational History   Not on file  Tobacco Use   Smoking status: Never    Passive exposure: Never   Smokeless tobacco: Never  Vaping Use   Vaping status: Never Used  Substance and Sexual Activity   Alcohol  use: No   Drug use: No   Sexual activity: Yes    Birth control/protection: None  Other Topics Concern   Not on file  Social History Narrative   Left handed    Lives with kid   Caffeine use: none   Social Drivers of Corporate investment banker Strain: Not on file  Food Insecurity: No Food Insecurity (10/09/2022)   Hunger  Vital Sign    Worried About Running Out of Food in the Last Year: Never true    Ran Out of Food in the Last Year: Never true  Transportation Needs: No Transportation Needs (10/09/2022)   PRAPARE - Administrator, Civil Service (Medical): No    Lack of Transportation (Non-Medical): No  Physical Activity: Not on file  Stress: Not on file  Social Connections: Not on file  Intimate Partner Violence: Not At Risk (10/09/2022)   Humiliation, Afraid, Rape, and Kick questionnaire    Fear of Current or Ex-Partner: No    Emotionally Abused: No    Physically Abused: No    Sexually Abused: No    Outpatient Medications Prior to Visit  Medication Sig Dispense Refill   KESIMPTA  20 MG/0.4ML SOAJ Inject 0.4 mLs into the skin every 30 (thirty) days. Starting at week 4 0.4 mL 11   nitrofurantoin , macrocrystal-monohydrate, (MACROBID ) 100 MG capsule Take 1 capsule (100 mg total) by mouth 2 (two) times daily. 10 capsule 0   Syringe, Disposable, (50-60CC SYRINGE) 60 ML MISC Flush 30-50cc of sterile water  into catheter daily as needed 30 each 11   Water  For Irrigation, Sterile (STERILE WATER ) Irrigate with 50 mLs as directed daily. Irrigate catheter daily with 50cc of sterile water . 1000 mL 15   acetaminophen  (TYLENOL ) 325  MG tablet Take 2 tablets (650 mg total) by mouth every 6 (six) hours as needed for mild pain (or Fever >/= 101). (Patient not taking: Reported on 01/19/2024) 12 tablet 0   acetic acid  0.25 % irrigation IRRIGATE WITH AS DIRECTED DAILY (Patient not taking: Reported on 01/19/2024) 1000 mL 11   amantadine  (SYMMETREL ) 100 MG capsule Take 1 capsule (100 mg total) by mouth 2 (two) times daily. (Patient not taking: Reported on 01/19/2024) 60 capsule 11   AMBULATORY NON FORMULARY MEDICATION 30 mLs by Intracatheter route daily as needed. Medication Name: Sodium Chloride  0.9% (Patient not taking: Reported on 01/19/2024) 900 mL 11   AMBULATORY NON FORMULARY MEDICATION Medication Name: Bedside catheter  bag (Patient not taking: Reported on 01/19/2024) 2 Bag 12   baclofen  (LIORESAL ) 10 MG tablet Take 1 tablet (10 mg total) by mouth 3 (three) times daily. (Patient not taking: Reported on 01/19/2024) 90 each 11   ciprofloxacin  (CIPRO ) 500 MG tablet Take 1 tablet (500 mg total) by mouth every 12 (twelve) hours. (Patient not taking: Reported on 01/19/2024) 14 tablet 0   KESIMPTA  20 MG/0.4ML SOAJ 0.70ml into the skin at week 0, 1 and 2. Then start on maintenance dose at week 4. (Patient not taking: Reported on 01/19/2024) 1.2 mL 0   meclizine  (ANTIVERT ) 25 MG tablet Take 1 tablet (25 mg total) by mouth 3 (three) times daily as needed for dizziness. (Patient not taking: Reported on 01/19/2024) 30 tablet 0   metoCLOPramide  (REGLAN ) 10 MG tablet Take 1 tablet (10 mg total) by mouth every 6 (six) hours. (Patient not taking: Reported on 01/19/2024) 12 tablet 0   Skin Protectants, Misc. [facility]) OINT Apply topically (Patient not taking: Reported on 01/19/2024) 227 g 1   Vitamin D , Ergocalciferol , (DRISDOL ) 1.25 MG (50000 UNIT) CAPS capsule Take 1 capsule (50,000 Units total) by mouth every 7 (seven) days. (Patient not taking: Reported on 01/19/2024) 20 capsule 1   No facility-administered medications prior to visit.    No Known Allergies  ROS Review of Systems  Constitutional:  Negative for chills and fever.  Eyes:  Negative for visual disturbance.  Respiratory:  Negative for chest tightness and shortness of breath.   Genitourinary:  Negative for dysuria, frequency, pelvic pain and urgency.  Neurological:  Negative for dizziness and headaches.      Objective:    Physical Exam HENT:     Head: Normocephalic.     Mouth/Throat:     Mouth: Mucous membranes are moist.  Cardiovascular:     Rate and Rhythm: Normal rate.     Heart sounds: Normal heart sounds.  Pulmonary:     Effort: Pulmonary effort is normal.     Breath sounds: Normal breath sounds.  Genitourinary:    Comments: Catheter in  place Neurological:     Mental Status: She is alert.     BP 119/80   Pulse (!) 105   Resp 16   Ht 5' 3 (1.6 m)   LMP 01/04/2024 (Approximate)   SpO2 96%   BMI 21.61 kg/m  Wt Readings from Last 3 Encounters:  01/11/24 122 lb (55.3 kg)  01/04/24 111 lb 5.3 oz (50.5 kg)  12/13/23 111 lb 15.9 oz (50.8 kg)    Lab Results  Component Value Date   TSH 1.680 05/26/2023   Lab Results  Component Value Date   WBC 4.8 01/11/2024   HGB 11.8 (L) 01/11/2024   HCT 37.7 01/11/2024   MCV 84.3 01/11/2024   PLT 197 01/11/2024  Lab Results  Component Value Date   NA 137 01/11/2024   K 4.1 01/11/2024   CO2 19 (L) 01/11/2024   GLUCOSE 73 01/11/2024   BUN 16 01/11/2024   CREATININE 0.79 01/11/2024   BILITOT 0.3 05/26/2023   ALKPHOS 84 05/26/2023   AST 10 05/26/2023   ALT 10 05/26/2023   PROT 7.4 05/26/2023   ALBUMIN 3.3 (L) 05/26/2023   CALCIUM  8.8 (L) 01/11/2024   ANIONGAP 12 01/11/2024   EGFR 85 05/26/2023   Lab Results  Component Value Date   CHOL 100 05/26/2023   Lab Results  Component Value Date   HDL 38 (L) 05/26/2023   Lab Results  Component Value Date   LDLCALC 51 05/26/2023   Lab Results  Component Value Date   TRIG 41 05/26/2023   Lab Results  Component Value Date   CHOLHDL 2.6 05/26/2023   Lab Results  Component Value Date   HGBA1C 5.3 05/26/2023      Assessment & Plan:  Acute cystitis without hematuria Assessment & Plan: -Encouraged to return a urine sample to the clinic at her earliest convenience to assess for UTI clearance.  -Continue to monitor for symptoms such as burning with urination, urinary frequency or urgency, lower abdominal pain, fever, or blood in the urine, and report any of these if they occur.  Discussed catheter care to prevent UTI -Wash hands before and after touching the catheter or drainage bag. -Clean the area around the catheter daily with mild soap and water . -Keep the drainage bag below bladder level and avoid letting  it touch the floor. -Empty the bag when two-thirds full using a clean container. -Make sure tubing is not kinked or twisted so urine flows freely. -Drink plenty of water  (unless told otherwise by your provider). -Call the clinic if you notice fever, chills, cloudy or foul-smelling urine, pain, or little urine output.  Orders: -     Urinalysis  Note: This chart has been completed using Engineer, civil (consulting) software, and while attempts have been made to ensure accuracy, certain words and phrases may not be transcribed as intended.    Follow-up: Return in about 4 months (around 05/21/2024).   Santo Zahradnik  Z Bacchus, FNP

## 2024-01-19 NOTE — Patient Instructions (Addendum)
 I appreciate the opportunity to provide care to you today!    Follow up:  4 months   -Please return a urine sample to the clinic at your earliest convenience to assess for UTI clearance.  -Continue to monitor for symptoms such as burning with urination, urinary frequency or urgency, lower abdominal pain, fever, or blood in the urine, and report any of these if they occur.  Catheter Care to Prevent UTI -Wash hands before and after touching the catheter or drainage bag. -Clean the area around the catheter daily with mild soap and water . -Keep the drainage bag below bladder level and avoid letting it touch the floor. -Empty the bag when two-thirds full using a clean container. -Make sure tubing is not kinked or twisted so urine flows freely. -Drink plenty of water  (unless told otherwise by your provider). -Call the clinic if you notice fever, chills, cloudy or foul-smelling urine, pain, or little urine output.  Please follow up if your symptoms worsen or fail to improve.    Please continue to a heart-healthy diet and increase your physical activities. Try to exercise for at least five days a week.    It was a pleasure to see you and I look forward to continuing to work together on your health and well-being. Please do not hesitate to call the office if you need care or have questions about your care.  In case of emergency, please visit the Emergency Department for urgent care, or contact our clinic at [phone] to schedule an appointment. We're here to help you!   Have a wonderful day and week. With Gratitude, Meade JENEANE Gerlach MSN, FNP-BC, PMHNP-BC

## 2024-01-23 ENCOUNTER — Telehealth: Payer: Self-pay

## 2024-01-23 NOTE — Telephone Encounter (Signed)
 Pt's called to confirmed appointment for SP tube and office visited. Pt was confirmed and voiced understanding.

## 2024-01-24 ENCOUNTER — Ambulatory Visit

## 2024-01-24 DIAGNOSIS — N3000 Acute cystitis without hematuria: Secondary | ICD-10-CM | POA: Diagnosis not present

## 2024-01-24 DIAGNOSIS — R339 Retention of urine, unspecified: Secondary | ICD-10-CM

## 2024-01-24 DIAGNOSIS — N319 Neuromuscular dysfunction of bladder, unspecified: Secondary | ICD-10-CM

## 2024-01-24 MED ORDER — CIPROFLOXACIN HCL 500 MG PO TABS
500.0000 mg | ORAL_TABLET | Freq: Once | ORAL | Status: AC
  Administered 2024-01-24: 500 mg via ORAL

## 2024-01-24 NOTE — Progress Notes (Addendum)
 Suprapubic Cath Change  Patient is present today for a suprapubic catheter change due to urinary retention.  10 ml of water  was drained from the balloon, a 22FR foley cath was removed from the tract with out difficulty.  Suprapubic catheter site was cleaned and prepped in a sterile fashion with Betadinex3  A 22 FR foley cath was replaced into the tract no complications were noted. Urine return was noted, 70CC flush  clear in color . 10 ml of sterile water  was inflated into the balloon and a night bag was attached for drainage.  Patient tolerated well. A night bag was given to patient and proper instruction was given on how to switch bags.    Performed by: Carlos, CMA  Follow up: Keep OV 03/05/2024 with MD, [name]

## 2024-01-25 LAB — URINALYSIS
Bilirubin, UA: NEGATIVE
Glucose, UA: NEGATIVE
Ketones, UA: NEGATIVE
Nitrite, UA: POSITIVE — AB
Specific Gravity, UA: 1.017 (ref 1.005–1.030)
Urobilinogen, Ur: 0.2 mg/dL (ref 0.2–1.0)
pH, UA: 6 (ref 5.0–7.5)

## 2024-01-26 ENCOUNTER — Telehealth: Payer: Self-pay

## 2024-01-26 ENCOUNTER — Encounter: Payer: Self-pay | Admitting: Neurology

## 2024-01-26 ENCOUNTER — Ambulatory Visit: Payer: Self-pay | Admitting: Family Medicine

## 2024-01-26 ENCOUNTER — Telehealth: Payer: Self-pay | Admitting: Neurology

## 2024-01-26 DIAGNOSIS — G35C1 Active secondary progressive multiple sclerosis: Secondary | ICD-10-CM | POA: Insufficient documentation

## 2024-01-26 NOTE — Telephone Encounter (Signed)
 Copied from CRM #[id number]. Topic: Clinical - Order For Equipment >> Jan 26, 2024  9:14 AM Tobias CROME wrote: Reason for CRM: Patient states she has not heard from anyone about her request for a medical table where she can place her food on, that she could place next to her hospital bed. Patient states she received the hospital bed already.   Patient states she was told she would get a callback and did not hear from office.   Patient requesting callback: [phone]

## 2024-01-26 NOTE — Telephone Encounter (Signed)
 Order placed for over the bed table and called pt and lvm that order has been placed and she should receive updates throughout the fullfillment process from [employer]

## 2024-01-26 NOTE — Progress Notes (Signed)
 Please inform the patient to complete the prescribed antibiotic as directed. Encourage her to follow up for a repeat urinalysis to assess for infection clearance, as the most recent urinalysis indicates persistent urinary tract infection (UTI).

## 2024-01-26 NOTE — Telephone Encounter (Signed)
 Patient called in stating Melissa West received a letter in the mail from Disability saying they need something from her doctor saying Melissa West still has MS. I asked if the letter mentioned something specific for us  to provide, Melissa West said it does not. I asked if Melissa West could up[load this into mychart to make sure we get what they need for her, Melissa West said Melissa West doesn't use mychart. Advised I would send a message to see if we can write a letter syaying Melissa West still ahs MS and is being treated by us  for this dx. Can this be done for pt? Melissa West would like it mailed to her

## 2024-01-27 ENCOUNTER — Telehealth: Payer: Self-pay | Admitting: Family Medicine

## 2024-01-27 NOTE — Telephone Encounter (Signed)
 Copied from CRM [id number]. Topic: Referral - Question >> Jan 27, 2024  9:01 AM Lonell PEDLAR wrote: Reason for CRM: Patient called with questions as to if she is still supposed to attend physical therapy. Please review and contact patient to advise

## 2024-01-30 ENCOUNTER — Telehealth: Payer: Self-pay

## 2024-01-30 NOTE — Telephone Encounter (Signed)
 Pt returned call. She confirmed that she is ok with letter being sent to her home address. Pt verbalized appreciation.

## 2024-01-30 NOTE — Telephone Encounter (Signed)
 I had put in the original note that patient requested it be mailed to her. I will mail for patient

## 2024-01-30 NOTE — Telephone Encounter (Addendum)
 Called and LVM for pt with information, Ok per [employer]. Left office number for pt to call back and inform us  how she would like to receive the letter.

## 2024-01-30 NOTE — Telephone Encounter (Signed)
 Copied from CRM #[id number]. Topic: Clinical - Lab/Test Results >> Jan 30, 2024  1:56 PM Gustabo D wrote: Melissa West pt the lab result message from her pcp and she wants to know if she needs to bring urine or setup appt to do it at the office

## 2024-01-30 NOTE — Telephone Encounter (Signed)
 Patient is returning call. Please call back.

## 2024-01-30 NOTE — Telephone Encounter (Signed)
 Per referral note Closing per therapist as patient was just discharged on 08/10/23 and there is nothing more that can be done functionally. Patient was asked to have a caregiver to attend sessions and no caregiver ever came. Patient can be scheduled if a caregiver will be able to come to every visit.  Lmtrc from patient

## 2024-01-31 ENCOUNTER — Telehealth: Payer: Self-pay

## 2024-01-31 NOTE — Telephone Encounter (Signed)
 Copied from CRM [id number]. Topic: General - Other >> Jan 31, 2024  9:39 AM Melissa West wrote: Reason for CRM: patient was told she is suppose to be getting a table for her hospital bed. She called the company and they advised her bed tables are not provided. She would like a call back, [phone]

## 2024-01-31 NOTE — Telephone Encounter (Signed)
 Patient can come by to leave sample, lmtrc

## 2024-01-31 NOTE — Telephone Encounter (Signed)
 Second attempt reaching patient

## 2024-01-31 NOTE — Telephone Encounter (Signed)
 Lmtrc

## 2024-02-03 ENCOUNTER — Telehealth: Payer: Self-pay

## 2024-02-03 DIAGNOSIS — G35D Multiple sclerosis, unspecified: Secondary | ICD-10-CM | POA: Diagnosis not present

## 2024-02-03 DIAGNOSIS — M6281 Muscle weakness (generalized): Secondary | ICD-10-CM | POA: Diagnosis not present

## 2024-02-03 DIAGNOSIS — R262 Difficulty in walking, not elsewhere classified: Secondary | ICD-10-CM | POA: Diagnosis not present

## 2024-02-03 NOTE — Telephone Encounter (Signed)
 Copied from CRM [id number]. Topic: Clinical - Lab/Test Results >> Feb 03, 2024 12:33 PM Nathanel BROCKS wrote: Reason for CRM: pt called and it was lunch time and she asked about brining an additional urine sample in. Please call pt and advise if this is what she needs to do. [phone]

## 2024-02-06 NOTE — Telephone Encounter (Signed)
 Unable to speak to patient, please inform patient to come by and leave urine sample

## 2024-02-07 ENCOUNTER — Telehealth: Payer: Self-pay

## 2024-02-07 NOTE — Telephone Encounter (Signed)
 Copied from CRM [id number]. Topic: General - Other >> Feb 07, 2024 10:23 AM Wess RAMAN wrote: Reason for CRM: Patient would like to know what her blood type is  Callback #: [id number]

## 2024-02-08 ENCOUNTER — Other Ambulatory Visit: Payer: Self-pay | Admitting: Family Medicine

## 2024-02-08 ENCOUNTER — Emergency Department (HOSPITAL_COMMUNITY)

## 2024-02-08 ENCOUNTER — Emergency Department (HOSPITAL_COMMUNITY)
Admission: EM | Admit: 2024-02-08 | Discharge: 2024-02-08 | Disposition: A | Discharge status: Home / Self Care | Attending: Emergency Medicine | Admitting: Emergency Medicine

## 2024-02-08 DIAGNOSIS — R519 Headache, unspecified: Secondary | ICD-10-CM | POA: Diagnosis not present

## 2024-02-08 DIAGNOSIS — N3000 Acute cystitis without hematuria: Secondary | ICD-10-CM

## 2024-02-08 LAB — BASIC METABOLIC PANEL WITH GFR
Anion gap: 10 (ref 5–15)
BUN: 15 mg/dL (ref 6–20)
CO2: 26 mmol/L (ref 22–32)
Calcium: 9 mg/dL (ref 8.9–10.3)
Chloride: 101 mmol/L (ref 98–111)
Creatinine, Ser: 0.86 mg/dL (ref 0.44–1.00)
GFR, Estimated: >60 mL/min (ref >60)
Glucose, Bld: 99 mg/dL (ref 70–99)
Potassium: 4 mmol/L (ref 3.5–5.1)
Sodium: 136 mmol/L (ref 135–145)

## 2024-02-08 LAB — CBC WITH DIFFERENTIAL/PLATELET
Abs Immature Granulocytes: 0.02 K/uL (ref 0.00–0.07)
Basophils Absolute: 0 K/uL (ref 0.0–0.1)
Basophils Relative: 0 %
Eosinophils Absolute: 0.1 K/uL (ref 0.0–0.5)
Eosinophils Relative: 1 %
HCT: 34.8 % — ABNORMAL LOW (ref 36.0–46.0)
Hemoglobin: 11.3 g/dL — ABNORMAL LOW (ref 12.0–15.0)
Immature Granulocytes: 0 %
Lymphocytes Relative: 18 %
Lymphs Abs: 1.3 K/uL (ref 0.7–4.0)
MCH: 27 pg (ref 26.0–34.0)
MCHC: 32.5 g/dL (ref 30.0–36.0)
MCV: 83.1 fL (ref 80.0–100.0)
Monocytes Absolute: 0.7 K/uL (ref 0.1–1.0)
Monocytes Relative: 9 %
Neutro Abs: 5 K/uL (ref 1.7–7.7)
Neutrophils Relative %: 72 %
Platelets: 230 K/uL (ref 150–400)
RBC: 4.19 MIL/uL (ref 3.87–5.11)
RDW: 15.7 % — ABNORMAL HIGH (ref 11.5–15.5)
WBC: 7.1 K/uL (ref 4.0–10.5)
nRBC: 0 % (ref 0.0–0.2)

## 2024-02-08 MED ORDER — ACETAMINOPHEN 500 MG PO TABS
1000.0000 mg | ORAL_TABLET | Freq: Once | ORAL | Status: AC
  Administered 2024-02-08: 1000 mg via ORAL
  Filled 2024-02-08: qty 2

## 2024-02-08 NOTE — ED Triage Notes (Signed)
 Patient BIB [facility] for c/o headache noted in the back region of head that began this morning, pain 6/10. Patient says the headache is accompanied with a metallic taste in her mouth, low energy and ear discomfort. Chronic Foley noted.

## 2024-02-08 NOTE — Telephone Encounter (Signed)
 Lvm on her mobile phone that her blood type is O pos

## 2024-02-08 NOTE — ED Provider Notes (Signed)
 [place] EMERGENCY DEPARTMENT AT [facility] Provider Note  [name]: [id number] Arrival date & time: 02/08/24 1403  Chief Complaint(s) Headache  HPI Melissa West is a 33 y.o. female history of multiple sclerosis, indwelling suprapubic catheter, quadriplegia presenting to the emergency department with headache.  She reports that earlier today, she had a mild headache in the back of her head and the front of the head.  Denies any head trauma.  Reports that the headache is largely resolved.  Denies any fevers or chills, neck pain or stiffness, nausea or vomiting, chest pain, shortness of breath, sore throat, runny nose.  Does report some bilateral ear pain which seems to come and go.  Denies any new numbness, tingling, weakness.  Reports chronic weakness worse in the legs.  Reports chronic unchanged spasticity.  No visual changes, trouble swallowing.  Does report abnormal taste in mouth.  Reports its taste bad.  Triage note reports metallic taste but the patient denies this, just reports a taste bad.  She does not know what it takes like.   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   Acute cystitis without hematuria 01/19/2024   Neurogenic bladder 10/28/2023   Chronic retention of urine 10/28/2023   Suprapubic catheter (HCC) 10/28/2023   STD (female) 12/31/2022   Hypokalemia 10/09/2022   Lower extremity weakness 10/08/2022   Chronic hypotension 11/18/2021   Osteomyelitis (HCC) 11/04/2021   Chronic anemia 05/20/2021   Hypotension 05/15/2021    Class: Acute   Leukocytosis 03/08/2021   Pressure injury of skin 03/08/2021   Chronic fatigue syndrome 02/09/2021   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   Ataxia 11/29/2018   Nystagmus 11/29/2018   MS (multiple sclerosis)    Home  Medication(s) Prior to Admission medications   Medication Sig Start Date End Date Taking? Authorizing Provider  acetaminophen  (TYLENOL ) 325 MG tablet Take 2 tablets (650 mg total) by mouth every 6 (six) hours as needed for mild pain (or Fever >/= 101). Patient not taking: Reported on 01/19/2024 05/20/21   Pearlean Manus, MD  acetic acid  0.25 % irrigation IRRIGATE WITH AS DIRECTED DAILY Patient not taking: Reported on 01/19/2024 03/22/23   Sherrilee Belvie CROME, MD  amantadine  (SYMMETREL ) 100 MG capsule Take 1 capsule (100 mg total) by mouth 2 (two) times daily. Patient not taking: Reported on 01/19/2024 01/26/23   Sater, Charlie LABOR, MD  AMBULATORY NON FORMULARY MEDICATION 30 mLs by Intracatheter route daily as needed. Medication Name: Sodium Chloride  0.9% Patient not taking: Reported on 01/19/2024 11/12/21   Sherrilee Belvie CROME, MD  AMBULATORY NON FORMULARY MEDICATION Medication Name: Bedside catheter bag Patient not taking: Reported on 01/19/2024 08/12/22   Watt Rush, MD  baclofen  (LIORESAL ) 10 MG tablet Take 1 tablet (10 mg total) by mouth 3 (three) times daily. Patient not taking: Reported on 01/19/2024 10/18/22   Sater, Charlie LABOR, MD  ciprofloxacin  (CIPRO ) 500 MG tablet Take 1 tablet (500 mg total) by mouth every 12 (twelve) hours. Patient not taking: Reported on 01/19/2024 05/18/23   Francis Ileana SAILOR, PA-C  KESIMPTA  20 MG/0.4ML SOAJ 0.4ml into the skin at week 0, 1 and 2. Then start on maintenance dose at week 4. Patient not taking: Reported on 01/19/2024 01/06/24   Vear Charlie LABOR, MD  KESIMPTA  20 MG/0.4ML SOAJ Inject 0.4 mLs into the  skin every 30 (thirty) days. Starting at week 4 01/06/24   Sater, Charlie LABOR, MD  meclizine  (ANTIVERT ) 25 MG tablet Take 1 tablet (25 mg total) by mouth 3 (three) times daily as needed for dizziness. Patient not taking: Reported on 01/19/2024 12/29/22   Zammit, Joseph, MD  metoCLOPramide  (REGLAN ) 10 MG tablet Take 1 tablet (10 mg total) by mouth every 6 (six) hours. Patient not  taking: Reported on 01/19/2024 04/21/23   Yolande Lamar BROCKS, MD  nitrofurantoin , macrocrystal-monohydrate, (MACROBID ) 100 MG capsule Take 1 capsule (100 mg total) by mouth 2 (two) times daily. 01/11/24   Dean Clarity, MD  Skin Protectants, Misc. (PERIGUARD) OINT Apply topically Patient not taking: Reported on 01/19/2024 05/26/23   Edman Meade PEDLAR, FNP  Syringe, Disposable, (50-60CC SYRINGE) 60 ML MISC Flush 30-50cc of sterile water  into catheter daily as needed 12/23/23   Sherrilee Belvie CROME, MD  Vitamin D , Ergocalciferol , (DRISDOL ) 1.25 MG (50000 UNIT) CAPS capsule Take 1 capsule (50,000 Units total) by mouth every 7 (seven) days. Patient not taking: Reported on 01/19/2024 06/25/23   Edman Meade PEDLAR, FNP  Water  For Irrigation, Sterile (STERILE WATER ) Irrigate with 50 mLs as directed daily. Irrigate catheter daily with 50cc of sterile water . 12/23/23   [name], Belvie CROME, MD                                                                                                                                    Past Surgical History Past Surgical History:  Procedure Laterality Date   CESAREAN SECTION  08/09/2011   Procedure: CESAREAN SECTION;  Surgeon: Burnard VEAR Pate, MD;  Location: [facility];  Service: Gynecology;  Laterality: N/A;   IR CATHETER TUBE CHANGE  07/22/2021   IR CATHETER TUBE CHANGE  09/10/2021   Family History Family History  Problem Relation Age of Onset   Diabetes Maternal Grandmother    Hypertension Maternal Grandmother    Kidney disease Maternal Grandmother     Social History Social History   Tobacco Use   Smoking status: Never    Passive exposure: Never   Smokeless tobacco: Never  Vaping Use   Vaping status: Never Used  Substance Use Topics   Alcohol  use: No   Drug use: No   Allergies Patient has no known allergies.  Review of Systems Review of Systems  All other systems reviewed and are negative.   Physical Exam Vital Signs  I have reviewed the triage vital  signs BP 90/63 (BP Location: Right Arm)   Pulse 91   Temp 99.3 F (37.4 C) (Oral)   Resp 18   Ht 5' 3 (1.6 m)   Wt 54.4 kg   LMP 01/04/2024 (Approximate)   SpO2 100%   BMI 21.26 kg/m  Physical Exam Vitals and nursing note reviewed.  Constitutional:      General: She is not in acute distress.    Appearance: She is well-developed.  HENT:  Head: Normocephalic and atraumatic.     Right Ear: Tympanic membrane normal.     Left Ear: Tympanic membrane normal.     Mouth/Throat:     Mouth: Mucous membranes are moist.     Comments: Dentition intact, mildly poor but no focal lesion Eyes:     Pupils: Pupils are equal, round, and reactive to light.  Cardiovascular:     Rate and Rhythm: Normal rate and regular rhythm.     Heart sounds: No murmur heard. Pulmonary:     Effort: Pulmonary effort is normal. No respiratory distress.     Breath sounds: Normal breath sounds.  Abdominal:     General: Abdomen is flat.     Palpations: Abdomen is soft.     Tenderness: There is no abdominal tenderness.  Musculoskeletal:        General: No tenderness.     Right lower leg: No edema.     Left lower leg: No edema.  Skin:    General: Skin is warm and dry.  Neurological:     General: No focal deficit present.     Mental Status: She is alert. Mental status is at baseline.     Comments: Strength 3 out of 5 in the bilateral lower extremities with chronic spasticity present.  Strength 4 out of 5 in the bilateral upper extremities with some spasticity present.  No cranial nerve deficit, cranial nerves II through XII intact.  No sensory deficit to light touch.  Psychiatric:        Mood and Affect: Mood normal.        Behavior: Behavior normal.     ED Results and Treatments Labs (all labs ordered are listed, but only abnormal results are displayed) Labs Reviewed  CBC WITH DIFFERENTIAL/PLATELET - Abnormal; Notable for the following components:      Result Value   Hemoglobin 11.3 (*)    HCT 34.8  (*)    RDW 15.7 (*)    All other components within normal limits  BASIC METABOLIC PANEL WITH GFR                                                                                                                          Radiology CT Head Wo Contrast Result Date: 02/08/2024 CLINICAL DATA:  Increasing headache posteriorly. History of multiple sclerosis. EXAM: CT HEAD WITHOUT CONTRAST TECHNIQUE: Contiguous axial images were obtained from the base of the skull through the vertex without intravenous contrast. RADIATION DOSE REDUCTION: This exam was performed according to the departmental dose-optimization program which includes automated exposure control, adjustment of the mA and/or kV according to patient size and/or use of iterative reconstruction technique. COMPARISON:  07/03/2021 FINDINGS: Brain: Ventricles are normal in size as there is continued evidence of absent septum pellucidum. Cisterns and CSF spaces are normal. There is no mass, mass effect or shift of midline structures. No acute hemorrhage. Stable white matter low attenuation compatible patient's known multiple sclerosis. Vascular: No hyperdense vessel  or unexpected calcification. Skull: Normal. Negative for fracture or focal lesion. Sinuses/Orbits: Visualized orbits and paranasal sinuses are clear. Hypoplastic frontal sinuses. Other: None. IMPRESSION: 1. No acute findings. 2. Stable white matter low attenuation compatible patient's known multiple sclerosis. Electronically Signed   By: Toribio Agreste M.D.   On: 02/08/2024 17:09    Pertinent labs & imaging results that were available during my care of the patient were reviewed by me and considered in my medical decision making (see MDM for details).  Medications Ordered in ED Medications  acetaminophen  (TYLENOL ) tablet 1,000 mg (has no administration in time range)                                                                                                                                      Procedures Procedures  (including critical care time)  Medical Decision Making / ED Course   MDM:  33 year old presenting with headache.  Patient is overall well-appearing.  Neurologic exam seems at baseline she denies any new neurologic symptoms.  Denies head trauma.  Differential includes tension headache, migraine headache, consider dangerous cause of headache such as intracranial bleeding, CNS infection, venous sinus thrombosis.  Patient's symptoms seem overall very mild.  She reports that headache essentially resolved on its own.  Neurologic exam seems at baseline.  Given past medical history will obtain CT scan but overall certain for dangerous process is low.  Patient also has a vague complaint of abnormal taste in mouth but unclear what would cause this.  Will check basic labs as well.  Clinical Course as of 02/08/24 1802  Wed Feb 08, 2024  1801 CT without evidence of acute process.  Labs are overall reassuring.  Patient reports continued to feel well.  Will discharge patient to home.  Patient is stable for discharge Will discharge patient to home. All questions answered. Patient comfortable with plan of discharge. Return precautions discussed with patient and specified on the after visit summary.  [WS]    Clinical Course User Index [WS] Francesca Elsie CROME, MD     Additional history obtained: -Additional history obtained from ems -External records from outside source obtained and reviewed including: Chart review including previous notes, labs, imaging, consultation notes including prior neurology notes    Lab Tests: -I ordered, reviewed, and interpreted labs.   The pertinent results include:   Labs Reviewed  CBC WITH DIFFERENTIAL/PLATELET - Abnormal; Notable for the following components:      Result Value   Hemoglobin 11.3 (*)    HCT 34.8 (*)    RDW 15.7 (*)    All other components within normal limits  BASIC METABOLIC PANEL WITH GFR    Notable for mild anemia     Imaging Studies ordered: I ordered imaging studies including CT head  On my interpretation imaging demonstrates no acute process I independently visualized and interpreted imaging. I agree with the radiologist interpretation   Medicines  ordered and prescription drug management: Meds ordered this encounter  Medications   acetaminophen  (TYLENOL ) tablet 1,000 mg    -I have reviewed the patients home medicines and have made adjustments as needed  Reevaluation: After the interventions noted above, I reevaluated the patient and found that their symptoms have resolved  Co morbidities that complicate the patient evaluation  Past Medical History:  Diagnosis Date   MS (multiple sclerosis)    No pertinent past medical history       Dispostion: Disposition decision including need for hospitalization was considered, and patient discharged from emergency department.    Final Clinical Impression(s) / ED Diagnoses Final diagnoses:  Acute nonintractable headache, unspecified headache type     This chart was dictated using voice recognition software.  Despite best efforts to proofread,  errors can occur which can change the documentation meaning.    Francesca Elsie CROME, MD 02/08/24 [phone]

## 2024-02-08 NOTE — Discharge Instructions (Signed)
 We evaluated you for your headache.  Your testing in the emergency department including CT scan were reassuring.  Please follow-up closely with your neurologist, Dr. Vear.  You can also follow-up with your primary doctor.  If you have any new or worsening symptoms such as recurrent or worsening headache, please return to the emergency department.

## 2024-02-12 ENCOUNTER — Other Ambulatory Visit: Payer: Self-pay | Admitting: Family Medicine

## 2024-02-12 ENCOUNTER — Ambulatory Visit: Payer: Self-pay | Admitting: Family Medicine

## 2024-02-12 DIAGNOSIS — N390 Urinary tract infection, site not specified: Secondary | ICD-10-CM

## 2024-02-12 LAB — MICROSCOPIC EXAMINATION
Casts: NONE SEEN /LPF
WBC, UA: >30 /HPF — AB (ref 0–5)

## 2024-02-12 LAB — UA/M W/RFLX CULTURE, ROUTINE
Bilirubin, UA: NEGATIVE
Glucose, UA: NEGATIVE
Ketones, UA: NEGATIVE
Nitrite, UA: POSITIVE — AB
Specific Gravity, UA: 1.019 (ref 1.005–1.030)
Urobilinogen, Ur: 1 mg/dL (ref 0.2–1.0)
pH, UA: 7 (ref 5.0–7.5)

## 2024-02-12 LAB — URINE CULTURE, REFLEX

## 2024-02-12 NOTE — Progress Notes (Signed)
 Please encourage the patient to follow up with urology as her labs indicate she still has a UTI, although she reports being asymptomatic.

## 2024-02-13 ENCOUNTER — Telehealth: Payer: Self-pay

## 2024-02-13 ENCOUNTER — Other Ambulatory Visit: Payer: Self-pay

## 2024-02-13 ENCOUNTER — Ambulatory Visit: Payer: Self-pay

## 2024-02-13 DIAGNOSIS — N390 Urinary tract infection, site not specified: Secondary | ICD-10-CM

## 2024-02-13 NOTE — Telephone Encounter (Signed)
 Copied from CRM [id number]. Topic: Clinical - Lab/Test Results >> Feb 13, 2024  1:50 PM Fonda T wrote: Reason for CRM: Patient calling, inquiring on test results, as she states she has not heard anything back.  Patient is requesting to speak to nurse, with a return call with additional questions she has.   Can be reached at [phone] to discuss further, patient is aware of same day call back.

## 2024-02-13 NOTE — Telephone Encounter (Signed)
 Pt called b/c her PCP told her to continue taking her Rx but pt is unsure as to which medication it is pt was advised to contact her PCP since they are the ones who wrote the prescription

## 2024-02-13 NOTE — Telephone Encounter (Signed)
 FYI Only or Action Required?: Action required by provider: referral request. Pt called urology for UTI, as per note from PCP - Urology referred pt back to PCP for care. And stated that they do not have anything from PCP regarding having her seen at urology.  Please advise.  Patient was last seen in primary care on 01/19/2024 by Edman Meade PEDLAR, FNP.  Called Nurse Triage reporting Advice Only.  Symptoms began several weeks ago.  Interventions attempted: Other: See at ED and in office for UTI.  Symptoms are: unchanged.  Triage Disposition: Call PCP Now  Patient/caregiver understands and will follow disposition?: Yes                       Summary: urinary discomfort / rx concern   Reason for Triage: The patient shares that they are uncertain if they should continue taking medication previously prescribed to them for a urinary tract infection. The patient is uncertain of the name of the medication but would like to discuss a potential refill further when possible.         Reason for Disposition  [1] Follow-up call from patient regarding patient's clinical status AND [2] information urgent  Answer Assessment - Initial Assessment Questions 1. REASON FOR CALL or QUESTION: What is your reason for calling today? or How can I best     Pt was told to follow up with urology. Pt called urology and they referred her back to PCP. Pt states that urology states that they do not see anything, (referral) where they are supposed to see her.  2. CALLER: Document the source of call. (e.g., laboratory staff, caregiver or patient).     Pt.  Protocols used: PCP Call - No Triage-A-AH

## 2024-02-13 NOTE — Telephone Encounter (Signed)
 Can not get patient on the phone, constantly leaving VM, please make patient aware to follow up with urology if she returns call

## 2024-02-13 NOTE — Telephone Encounter (Signed)
 Referral placed.

## 2024-02-20 ENCOUNTER — Emergency Department (HOSPITAL_COMMUNITY)

## 2024-02-20 ENCOUNTER — Encounter (HOSPITAL_COMMUNITY): Payer: Self-pay | Admitting: Emergency Medicine

## 2024-02-20 ENCOUNTER — Inpatient Hospital Stay (HOSPITAL_COMMUNITY)
Admission: EM | Admit: 2024-02-20 | Discharge: 2024-02-23 | DRG: 392 | Disposition: A | Discharge status: Home / Self Care | Attending: Family Medicine | Admitting: Family Medicine

## 2024-02-20 ENCOUNTER — Other Ambulatory Visit: Payer: Self-pay

## 2024-02-20 DIAGNOSIS — Z8249 Family history of ischemic heart disease and other diseases of the circulatory system: Secondary | ICD-10-CM

## 2024-02-20 DIAGNOSIS — K5904 Chronic idiopathic constipation: Principal | ICD-10-CM | POA: Diagnosis present

## 2024-02-20 DIAGNOSIS — G35D Multiple sclerosis, unspecified: Secondary | ICD-10-CM | POA: Diagnosis present

## 2024-02-20 DIAGNOSIS — N3000 Acute cystitis without hematuria: Secondary | ICD-10-CM | POA: Diagnosis present

## 2024-02-20 DIAGNOSIS — K5900 Constipation, unspecified: Secondary | ICD-10-CM | POA: Diagnosis not present

## 2024-02-20 DIAGNOSIS — D649 Anemia, unspecified: Secondary | ICD-10-CM | POA: Diagnosis present

## 2024-02-20 DIAGNOSIS — N319 Neuromuscular dysfunction of bladder, unspecified: Secondary | ICD-10-CM | POA: Diagnosis present

## 2024-02-20 DIAGNOSIS — Z960 Presence of urogenital implants: Secondary | ICD-10-CM | POA: Diagnosis present

## 2024-02-20 DIAGNOSIS — I9589 Other hypotension: Secondary | ICD-10-CM | POA: Diagnosis present

## 2024-02-20 DIAGNOSIS — Z993 Dependence on wheelchair: Secondary | ICD-10-CM

## 2024-02-20 DIAGNOSIS — Z833 Family history of diabetes mellitus: Secondary | ICD-10-CM

## 2024-02-20 DIAGNOSIS — B964 Proteus (mirabilis) (morganii) as the cause of diseases classified elsewhere: Secondary | ICD-10-CM | POA: Diagnosis present

## 2024-02-20 LAB — BASIC METABOLIC PANEL WITH GFR
Anion gap: 11 (ref 5–15)
BUN: 15 mg/dL (ref 6–20)
CO2: 24 mmol/L (ref 22–32)
Calcium: 8.9 mg/dL (ref 8.9–10.3)
Chloride: 99 mmol/L (ref 98–111)
Creatinine, Ser: 0.66 mg/dL (ref 0.44–1.00)
GFR, Estimated: >60 mL/min (ref >60)
Glucose, Bld: 118 mg/dL — ABNORMAL HIGH (ref 70–99)
Potassium: 3.8 mmol/L (ref 3.5–5.1)
Sodium: 135 mmol/L (ref 135–145)

## 2024-02-20 LAB — CBC WITH DIFFERENTIAL/PLATELET
Abs Immature Granulocytes: 0.02 K/uL (ref 0.00–0.07)
Basophils Absolute: 0 K/uL (ref 0.0–0.1)
Basophils Relative: 0 %
Eosinophils Absolute: 0.1 K/uL (ref 0.0–0.5)
Eosinophils Relative: 1 %
HCT: 33.7 % — ABNORMAL LOW (ref 36.0–46.0)
Hemoglobin: 10.8 g/dL — ABNORMAL LOW (ref 12.0–15.0)
Immature Granulocytes: 0 %
Lymphocytes Relative: 8 %
Lymphs Abs: 0.8 K/uL (ref 0.7–4.0)
MCH: 25.8 pg — ABNORMAL LOW (ref 26.0–34.0)
MCHC: 32 g/dL (ref 30.0–36.0)
MCV: 80.4 fL (ref 80.0–100.0)
Monocytes Absolute: 0.6 K/uL (ref 0.1–1.0)
Monocytes Relative: 6 %
Neutro Abs: 8.5 K/uL — ABNORMAL HIGH (ref 1.7–7.7)
Neutrophils Relative %: 85 %
Platelets: 447 K/uL — ABNORMAL HIGH (ref 150–400)
RBC: 4.19 MIL/uL (ref 3.87–5.11)
RDW: 15.3 % (ref 11.5–15.5)
WBC: 10 K/uL (ref 4.0–10.5)
nRBC: 0 % (ref 0.0–0.2)

## 2024-02-20 MED ORDER — ACETAMINOPHEN 500 MG PO TABS
500.0000 mg | ORAL_TABLET | Freq: Four times a day (QID) | ORAL | Status: DC | PRN
Start: 2024-02-20 — End: 2024-02-23

## 2024-02-20 MED ORDER — POLYETHYLENE GLYCOL 3350 17 G PO PACK: 17.0000 g | PACK | Freq: Every day | ORAL | Status: DC | PRN

## 2024-02-20 MED ORDER — SODIUM CHLORIDE 0.9 % IV SOLN: INTRAVENOUS | Status: AC

## 2024-02-20 MED ORDER — SENNOSIDES-DOCUSATE SODIUM 8.6-50 MG PO TABS
2.0000 | ORAL_TABLET | Freq: Two times a day (BID) | ORAL | Status: AC
  Administered 2024-02-20 – 2024-02-22 (×4): 2 via ORAL
  Filled 2024-02-20 (×4): qty 2

## 2024-02-20 MED ORDER — POLYETHYLENE GLYCOL 3350 17 G PO PACK
17.0000 g | PACK | Freq: Once | ORAL | Status: AC
  Administered 2024-02-20: 17 g via ORAL
  Filled 2024-02-20: qty 1

## 2024-02-20 MED ORDER — SMOG ENEMA
400.0000 mL | Freq: Once | RECTAL | Status: AC
  Administered 2024-02-21: 400 mL via RECTAL
  Filled 2024-02-20: qty 960

## 2024-02-20 MED ORDER — MELATONIN 3 MG PO TABS
6.0000 mg | ORAL_TABLET | Freq: Every evening | ORAL | Status: DC | PRN
Start: 2024-02-20 — End: 2024-02-23

## 2024-02-20 MED ORDER — MAGNESIUM HYDROXIDE 400 MG/5ML PO SUSP
15.0000 mL | Freq: Once | ORAL | Status: AC
  Administered 2024-02-20: 15 mL via ORAL
  Filled 2024-02-20: qty 30

## 2024-02-20 MED ORDER — PROCHLORPERAZINE EDISYLATE 10 MG/2ML IJ SOLN: 5.0000 mg | Freq: Four times a day (QID) | INTRAMUSCULAR | Status: DC | PRN

## 2024-02-20 MED ORDER — ENOXAPARIN SODIUM 40 MG/0.4ML IJ SOSY
40.0000 mg | PREFILLED_SYRINGE | INTRAMUSCULAR | Status: DC
  Administered 2024-02-22: 40 mg via SUBCUTANEOUS
  Filled 2024-02-20 (×3): qty 0.4

## 2024-02-20 NOTE — ED Provider Notes (Signed)
  EMERGENCY DEPARTMENT AT [facility] Provider Note   [name]: [id number] Arrival date & time: 02/20/24  1929     Patient presents with: Constipation   Melissa West is a 33 y.o. female. She has history of MS, is bedbound, has SP cath d/t neurogenic bladder.  Notes to ER today complaining of constipation.  States she is not having any pain, no fever or chills, no vomiting.  She is able to pass gas.  Denies any food changes, not on chronic narcotics.  No history of constipation in the past, was told that she was having daily bowel movements. She denies N/V, denies abdominal pain      Constipation      Prior to Admission medications   Medication Sig Start Date End Date Taking? Authorizing Provider  acetaminophen  (TYLENOL ) 325 MG tablet Take 2 tablets (650 mg total) by mouth every 6 (six) hours as needed for mild pain (or Fever >/= 101). Patient not taking: Reported on 01/19/2024 05/20/21   Pearlean Manus, MD  acetic acid  0.25 % irrigation IRRIGATE WITH AS DIRECTED DAILY Patient not taking: Reported on 01/19/2024 03/22/23   Sherrilee Belvie CROME, MD  amantadine  (SYMMETREL ) 100 MG capsule Take 1 capsule (100 mg total) by mouth 2 (two) times daily. Patient not taking: Reported on 01/19/2024 01/26/23   Sater, Charlie LABOR, MD  AMBULATORY NON FORMULARY MEDICATION 30 mLs by Intracatheter route daily as needed. Medication Name: Sodium Chloride  0.9% Patient not taking: Reported on 01/19/2024 11/12/21   Sherrilee Belvie CROME, MD  AMBULATORY NON FORMULARY MEDICATION Medication Name: Bedside catheter bag Patient not taking: Reported on 01/19/2024 08/12/22   Watt Rush, MD  baclofen  (LIORESAL ) 10 MG tablet Take 1 tablet (10 mg total) by mouth 3 (three) times daily. Patient not taking: Reported on 01/19/2024 10/18/22   Sater, Charlie LABOR, MD  ciprofloxacin  (CIPRO ) 500 MG tablet Take 1 tablet (500 mg total) by mouth every 12 (twelve) hours. Patient not taking: Reported on 01/19/2024 05/18/23    Francis Ileana SAILOR, PA-C  KESIMPTA  20 MG/0.4ML SOAJ 0.4ml into the skin at week 0, 1 and 2. Then start on maintenance dose at week 4. Patient not taking: Reported on 01/19/2024 01/06/24   Vear Charlie LABOR, MD  KESIMPTA  20 MG/0.4ML SOAJ Inject 0.4 mLs into the skin every 30 (thirty) days. Starting at week 4 01/06/24   Sater, Charlie LABOR, MD  meclizine  (ANTIVERT ) 25 MG tablet Take 1 tablet (25 mg total) by mouth 3 (three) times daily as needed for dizziness. Patient not taking: Reported on 01/19/2024 12/29/22   Zammit, Joseph, MD  metoCLOPramide  (REGLAN ) 10 MG tablet Take 1 tablet (10 mg total) by mouth every 6 (six) hours. Patient not taking: Reported on 01/19/2024 04/21/23   Yolande Lamar BROCKS, MD  nitrofurantoin , macrocrystal-monohydrate, (MACROBID ) 100 MG capsule Take 1 capsule (100 mg total) by mouth 2 (two) times daily. 01/11/24   Dean Clarity, MD  Skin Protectants, Misc. (PERIGUARD) OINT Apply topically Patient not taking: Reported on 01/19/2024 05/26/23   Edman Meade PEDLAR, FNP  Syringe, Disposable, (50-60CC SYRINGE) 60 ML MISC Flush 30-50cc of sterile water  into catheter daily as needed 12/23/23   Sherrilee Belvie CROME, MD  Vitamin D , Ergocalciferol , (DRISDOL ) 1.25 MG (50000 UNIT) CAPS capsule Take 1 capsule (50,000 Units total) by mouth every 7 (seven) days. Patient not taking: Reported on 01/19/2024 06/25/23   Edman Meade PEDLAR, FNP  Water  For Irrigation, Sterile (STERILE WATER ) Irrigate with 50 mLs as directed daily. Irrigate catheter daily with  50cc of sterile water . 12/23/23   [name], Belvie CROME, MD    Allergies: Patient has no known allergies.    Review of Systems  Gastrointestinal:  Positive for constipation.    Updated Vital Signs BP 99/76   Pulse 94   Temp 98.2 F (36.8 C) (Oral)   Resp 17   Ht 5' 3 (1.6 m)   Wt 54.5 kg   SpO2 96%   BMI 21.28 kg/m   Physical Exam Vitals and nursing note reviewed.  Constitutional:      General: She is not in acute distress.    Appearance: She is  well-developed.  HENT:     Head: Normocephalic and atraumatic.     Mouth/Throat:     Mouth: Mucous membranes are moist.  Eyes:     Conjunctiva/sclera: Conjunctivae normal.  Cardiovascular:     Rate and Rhythm: Normal rate and regular rhythm.     Heart sounds: No murmur heard. Pulmonary:     Effort: Pulmonary effort is normal. No respiratory distress.     Breath sounds: Normal breath sounds.  Abdominal:     Palpations: Abdomen is soft.     Tenderness: There is no abdominal tenderness.  Musculoskeletal:        General: No swelling.     Cervical back: Neck supple.  Skin:    General: Skin is warm and dry.     Capillary Refill: Capillary refill takes less than 2 seconds.  Neurological:     General: No focal deficit present.     Mental Status: She is alert and oriented to person, place, and time.  Psychiatric:        Mood and Affect: Mood normal.     (all labs ordered are listed, but only abnormal results are displayed) Labs Reviewed  CBC WITH DIFFERENTIAL/PLATELET - Abnormal; Notable for the following components:      Result Value   Hemoglobin 10.8 (*)    HCT 33.7 (*)    MCH 25.8 (*)    Platelets 447 (*)    Neutro Abs 8.5 (*)    All other components within normal limits  BASIC METABOLIC PANEL WITH GFR - Abnormal; Notable for the following components:   Glucose, Bld 118 (*)    All other components within normal limits    EKG: None  Radiology: DG Abd Acute W/Chest Result Date: 02/20/2024 EXAM: UPRIGHT AND SUPINE XRAY VIEWS OF THE ABDOMEN AND 4 VIEW(S) OF THE CHEST 02/20/2024 08:40:00 PM COMPARISON: 05/18/2023 CLINICAL HISTORY: No bowel movement for 4 days. FINDINGS: LUNGS AND PLEURA: Mildly lower lung volumes. No consolidation or pulmonary edema. No pleural effusion or pneumothorax. HEART AND MEDIASTINUM: No acute abnormality of the cardiac and mediastinal silhouettes. BOWEL: Large amount of retained stool in the left colon. The bowel gas pattern is nonspecific. No bowel  obstruction. PERITONEUM AND SOFT TISSUES: No abnormal calcifications. No free air. BONES: No acute osseous abnormality. IMPRESSION: 1. Large amount of retained stool in the left colon. Electronically signed by: Norman Gatlin MD 02/20/2024 08:44 PM EST RP Workstation: [id number]     Procedures   Medications Ordered in the ED  magnesium  hydroxide (MILK OF MAGNESIA) suspension 15 mL (15 mLs Oral Given 02/20/24 2100)                                    Medical Decision Making This patient presents to the ED for concern of constipation, this  involves an extensive number of treatment options, and is a complaint that carries with it a high risk of complications and morbidity.  The differential diagnosis includes functional constipation, MS flare, bowel obstruction, other   Co morbidities that complicate the patient evaluation :   MS, SP catheter   Additional history obtained:  Additional history obtained from EMR External records from outside source obtained and reviewed including prior notes and labs   Lab Tests:  I Ordered, and personally interpreted labs.  The pertinent results include: CBC and BMP are reassuring   Imaging Studies ordered:  I ordered imaging studies including acute abdominal series with PA chest which shows volume stool and left colon I independently visualized and interpreted imaging within scope of identifying emergent findings  I agree with the radiologist interpretation     Consultations Obtained:  I requested consultation with the hospitalist, Dr. Shona,  and discussed lab and imaging findings as well as pertinent plan - they recommend: Admission for possible MS flare   Problem List / ED Course / Critical interventions / Medication management  Acute constipation-patient states no bowel movement in at least 4 to 5 days.  She is never had this in the past.  She is not on any new medications, specifically she is not on any opiates.  She is not vomiting,  does not have reduced appetite.  She is passing gas.  Abdomen is soft and nontender.  X-ray shows large amount of stool left colon but no other acute findings and no obstructive pattern.  We attempted enema and magnesium  citrate and patient did not pass any stool, feel that this is most likely MS flare, but unfortunately not able to get MRI due to being past 7 PM on evaluation. I ordered medication including magnesium  citrate and soapsuds enema for constipation Reevaluation of the patient after these medicines showed that the patient stayed the same I have reviewed the patients home medicines and have made adjustments as needed       Amount and/or Complexity of Data Reviewed Labs: ordered. Radiology: ordered.  Risk OTC drugs. Decision regarding hospitalization.        Final diagnoses:  Constipation, unspecified constipation type    ED Discharge Orders     None          Suellen Sherran DELENA DEVONNA 02/20/24 2244    Charlyn Sora, MD 02/20/24 2259

## 2024-02-20 NOTE — H&P (Incomplete)
 History and Physical  Melissa West FMW:[id number] DOB: April 22, 1990 DOA: 02/20/2024  Referring physician: Sherran Barks, PA-EDP   PCP: Edman Meade PEDLAR, FNP  Outpatient Specialists: Urology, neurology. Patient coming from: Home.  Chief Complaint: Constipation x 4 to 5 days.  HPI: Melissa West is a 33 y.o. 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  States she normally has a bowel movement every day.  Denies any abdominal pain, nausea or vomiting.  No reported subjective fever.  States she is always cold.  Denies any other symptoms.  In the ER, an abdominal x-ray revealed large amount of retained stool in the left colon.  No free air.  No abnormal calcifications.  The patient received milk of magnesia and MiraLAX  with no improvement.  TRH, hospitalist service, was asked to admit for constipation and concern for possible MS flare.  ED Course: Temperature 98.2.  BP 98/82, pulse 94, respiration rate 18, O2 saturation 99% on room air.  Review of Systems: Review of systems as noted in the HPI. All other systems reviewed and are negative.   Past Medical History:  Diagnosis Date   MS (multiple sclerosis)    No pertinent past medical history    Past Surgical History:  Procedure Laterality Date   CESAREAN SECTION  08/09/2011   Procedure: CESAREAN SECTION;  Surgeon: Burnard VEAR Pate, MD;  Location: [facility];  Service: Gynecology;  Laterality: N/A;   IR CATHETER TUBE CHANGE  07/22/2021   IR CATHETER TUBE CHANGE  09/10/2021    Social History:  reports that she has never smoked. She has never been exposed to tobacco smoke. She has never used smokeless tobacco. She reports that she does not drink alcohol  and does not use drugs.   No Known Allergies  Family History   Problem Relation Age of Onset   Diabetes Maternal Grandmother    Hypertension Maternal Grandmother    Kidney disease Maternal Grandmother       Prior to Admission medications   Medication Sig Start Date End Date Taking? Authorizing Provider  acetaminophen  (TYLENOL ) 325 MG tablet Take 2 tablets (650 mg total) by mouth every 6 (six) hours as needed for mild pain (or Fever >/= 101). Patient not taking: Reported on 01/19/2024 05/20/21   Pearlean Manus, MD  acetic acid  0.25 % irrigation IRRIGATE WITH AS DIRECTED DAILY Patient not taking: Reported on 01/19/2024 03/22/23   Sherrilee Belvie CROME, MD  amantadine  (SYMMETREL ) 100 MG capsule Take 1 capsule (100 mg total) by mouth 2 (two) times daily. Patient not taking: Reported on 01/19/2024 01/26/23   Sater, Charlie LABOR, MD  AMBULATORY NON FORMULARY MEDICATION 30 mLs by Intracatheter route daily as needed. Medication Name: Sodium Chloride  0.9% Patient not taking: Reported on 01/19/2024 11/12/21   Sherrilee Belvie CROME, MD  AMBULATORY NON FORMULARY MEDICATION Medication Name: Bedside catheter bag Patient not taking: Reported on 01/19/2024 08/12/22   Watt Rush, MD  baclofen  (LIORESAL ) 10 MG tablet Take 1 tablet (10 mg total) by mouth 3 (three) times daily. Patient not taking: Reported on 01/19/2024 10/18/22   Sater, Charlie LABOR, MD  ciprofloxacin  (CIPRO ) 500 MG tablet Take 1 tablet (500 mg total) by mouth every 12 (twelve) hours. Patient not taking: Reported on 01/19/2024 05/18/23   Francis Ileana SAILOR, PA-C  KESIMPTA  20 MG/0.4ML SOAJ 0.4ml into the skin at week  0, 1 and 2. Then start on maintenance dose at week 4. Patient not taking: Reported on 01/19/2024 01/06/24   Vear Charlie LABOR, MD  KESIMPTA  20 MG/0.4ML SOAJ Inject 0.4 mLs into the skin every 30 (thirty) days. Starting at week 4 01/06/24   Sater, Charlie LABOR, MD  meclizine  (ANTIVERT ) 25 MG tablet Take 1 tablet (25 mg total) by mouth 3 (three) times daily as needed for dizziness. Patient not taking: Reported on 01/19/2024  12/29/22   Zammit, Joseph, MD  metoCLOPramide  (REGLAN ) 10 MG tablet Take 1 tablet (10 mg total) by mouth every 6 (six) hours. Patient not taking: Reported on 01/19/2024 04/21/23   Yolande Lamar BROCKS, MD  nitrofurantoin , macrocrystal-monohydrate, (MACROBID ) 100 MG capsule Take 1 capsule (100 mg total) by mouth 2 (two) times daily. 01/11/24   Dean Clarity, MD  Skin Protectants, Misc. (PERIGUARD) OINT Apply topically Patient not taking: Reported on 01/19/2024 05/26/23   Edman Meade PEDLAR, FNP  Syringe, Disposable, (50-60CC SYRINGE) 60 ML MISC Flush 30-50cc of sterile water  into catheter daily as needed 12/23/23   Sherrilee Belvie CROME, MD  Vitamin D , Ergocalciferol , (DRISDOL ) 1.25 MG (50000 UNIT) CAPS capsule Take 1 capsule (50,000 Units total) by mouth every 7 (seven) days. Patient not taking: Reported on 01/19/2024 06/25/23   Edman Meade PEDLAR, FNP  Water  For Irrigation, Sterile (STERILE WATER ) Irrigate with 50 mLs as directed daily. Irrigate catheter daily with 50cc of sterile water . 12/23/23   [name], Belvie CROME, MD    Physical Exam: BP 100/79   Pulse 92   Temp 98.2 F (36.8 C) (Oral)   Resp 17   Ht 5' 3 (1.6 m)   Wt 54.5 kg   SpO2 98%   BMI 21.28 kg/m   General: 33 y.o. year-old female well developed well nourished in no acute distress.  Alert and oriented x3. Cardiovascular: Regular rate and rhythm with no rubs or gallops.  No thyromegaly or JVD noted.  No lower extremity edema. 2/4 pulses in all 4 extremities. Respiratory: Clear to auscultation with no wheezes or rales. Good inspiratory effort. Abdomen: Soft nontender nondistended with normal bowel sounds x4 quadrants.  Suprapubic catheter in place. Muskuloskeletal: No cyanosis, clubbing or edema noted bilaterally Neuro: CN II-XII intact, strength, sensation, reflexes Skin: No ulcerative lesions noted or rashes Psychiatry: Judgement and insight appear normal. Mood is appropriate for condition and setting          Labs on Admission:   Basic Metabolic Panel: Recent Labs  Lab 02/20/24 2009  NA 135  K 3.8  CL 99  CO2 24  GLUCOSE 118*  BUN 15  CREATININE 0.66  CALCIUM  8.9   Liver Function Tests: No results for input(s): AST, ALT, ALKPHOS, BILITOT, PROT, ALBUMIN in the last 168 hours. No results for input(s): LIPASE, AMYLASE in the last 168 hours. No results for input(s): AMMONIA in the last 168 hours. CBC: Recent Labs  Lab 02/20/24 2009  WBC 10.0  NEUTROABS 8.5*  HGB 10.8*  HCT 33.7*  MCV 80.4  PLT 447*   Cardiac Enzymes: No results for input(s): CKTOTAL, CKMB, CKMBINDEX, TROPONINI in the last 168 hours.  BNP (last 3 results) No results for input(s): BNP in the last 8760 hours.  ProBNP (last 3 results) No results for input(s): PROBNP in the last 8760 hours.  CBG: No results for input(s): GLUCAP in the last 168 hours.  Radiological Exams on Admission: DG Abd Acute W/Chest Result Date: 02/20/2024 EXAM: UPRIGHT AND SUPINE XRAY VIEWS OF THE ABDOMEN AND  4 VIEW(S) OF THE CHEST 02/20/2024 08:40:00 PM COMPARISON: 05/18/2023 CLINICAL HISTORY: No bowel movement for 4 days. FINDINGS: LUNGS AND PLEURA: Mildly lower lung volumes. No consolidation or pulmonary edema. No pleural effusion or pneumothorax. HEART AND MEDIASTINUM: No acute abnormality of the cardiac and mediastinal silhouettes. BOWEL: Large amount of retained stool in the left colon. The bowel gas pattern is nonspecific. No bowel obstruction. PERITONEUM AND SOFT TISSUES: No abnormal calcifications. No free air. BONES: No acute osseous abnormality. IMPRESSION: 1. Large amount of retained stool in the left colon. Electronically signed by: Norman Gatlin MD 02/20/2024 08:44 PM EST RP Workstation: [id number]    EKG: I independently viewed the EKG done and my findings are as followed: None available at the time of this visit.  Assessment/Plan Present on Admission:  Constipation  Principal Problem:    Constipation  Constipation On abdominal x-ray, large amount of retained stool in the left colon.  No free air.  No abnormal calcifications. Endorses no prior history of constipation Not on opioids or anticholinergics.  The only medication she takes is Kesimpta  injection for MS No bowel movements in the last 4 to 5 days. Continue bowel regimen: Senokot 2 tablets twice daily, MiraLAX  daily If no improvement overnight, administer smog enema 400 mg times 1 in the morning Continue IV fluid hydration  Multiple sclerosis Takes Kesimpta  injection every month. Not on any other medications. Follow-up with neurology outpatient  Chronic normocytic anemia Hemoglobin 10.8 with MCV of 80 No overt bleeding reported Continue to monitor.  Neurogenic bladder status post suprapubic catheter placement prior to admission Continue routine Foley catheter care. Follow-up with urology outpatient.    Time: 75 minutes.    DVT prophylaxis: Subcu Lovenox  daily.  Code Status: Full code.  Family Communication: None at bedside.  Disposition Plan: Admitted to MedSurg unit.  Consults called: None.  Admission status: Observation status.   Status is: Observation    Terry LOISE Hurst MD [facility] Pager [phone]  If 7PM-7AM, please contact night-coverage [email or web address] Password [email or web address]  02/20/2024, 11:13 PM

## 2024-02-20 NOTE — ED Triage Notes (Addendum)
 Pt c/o abd discomfort. No bowel movement in 4 days. She also c/o foul odor coming from mouth.

## 2024-02-21 ENCOUNTER — Ambulatory Visit

## 2024-02-21 DIAGNOSIS — G35D Multiple sclerosis, unspecified: Secondary | ICD-10-CM | POA: Diagnosis not present

## 2024-02-21 DIAGNOSIS — K5900 Constipation, unspecified: Secondary | ICD-10-CM | POA: Diagnosis not present

## 2024-02-21 LAB — MAGNESIUM: Magnesium: 1.9 mg/dL (ref 1.7–2.4)

## 2024-02-21 LAB — HIV ANTIBODY (ROUTINE TESTING W REFLEX): HIV Screen 4th Generation wRfx: NONREACTIVE

## 2024-02-21 LAB — PHOSPHORUS: Phosphorus: 2.3 mg/dL — ABNORMAL LOW (ref 2.5–4.6)

## 2024-02-21 LAB — URINALYSIS, W/ REFLEX TO CULTURE (INFECTION SUSPECTED)
Bilirubin Urine: NEGATIVE
Glucose, UA: NEGATIVE mg/dL
Ketones, ur: NEGATIVE mg/dL
Nitrite: NEGATIVE
Protein, ur: 100 mg/dL — AB
Specific Gravity, Urine: 1.01 (ref 1.005–1.030)
WBC, UA: >50 WBC/hpf (ref 0–5)
pH: 7 (ref 5.0–8.0)

## 2024-02-21 LAB — CBC
HCT: 29.5 % — ABNORMAL LOW (ref 36.0–46.0)
Hemoglobin: 9.6 g/dL — ABNORMAL LOW (ref 12.0–15.0)
MCH: 26.1 pg (ref 26.0–34.0)
MCHC: 32.5 g/dL (ref 30.0–36.0)
MCV: 80.2 fL (ref 80.0–100.0)
Platelets: 415 K/uL — ABNORMAL HIGH (ref 150–400)
RBC: 3.68 MIL/uL — ABNORMAL LOW (ref 3.87–5.11)
RDW: 15.2 % (ref 11.5–15.5)
WBC: 8.1 K/uL (ref 4.0–10.5)
nRBC: 0 % (ref 0.0–0.2)

## 2024-02-21 LAB — BASIC METABOLIC PANEL WITH GFR
Anion gap: 10 (ref 5–15)
BUN: 12 mg/dL (ref 6–20)
CO2: 24 mmol/L (ref 22–32)
Calcium: 8.8 mg/dL — ABNORMAL LOW (ref 8.9–10.3)
Chloride: 101 mmol/L (ref 98–111)
Creatinine, Ser: 0.62 mg/dL (ref 0.44–1.00)
GFR, Estimated: >60 mL/min (ref >60)
Glucose, Bld: 96 mg/dL (ref 70–99)
Potassium: 3.7 mmol/L (ref 3.5–5.1)
Sodium: 135 mmol/L (ref 135–145)

## 2024-02-21 MED ORDER — ENSURE PLUS HIGH PROTEIN PO LIQD
237.0000 mL | Freq: Two times a day (BID) | ORAL | Status: DC
  Administered 2024-02-21 – 2024-02-23 (×4): 237 mL via ORAL

## 2024-02-21 MED ORDER — MAGIC MOUTHWASH
5.0000 mL | Freq: Three times a day (TID) | ORAL | Status: DC | PRN
  Administered 2024-02-21: 5 mL via ORAL
  Filled 2024-02-21 (×2): qty 5

## 2024-02-21 NOTE — ED Notes (Signed)
 Patient had a medium sized bowel movement, patient is requesting to hold off on enema due to having a bowel movement.

## 2024-02-21 NOTE — ED Notes (Signed)
 Contacted pharmacy about SMOG enema.

## 2024-02-21 NOTE — TOC CM/SW Note (Signed)
 Transition of Care [facility]) - Inpatient Brief Assessment   Patient Details  Name: Melissa West MRN: [id number] Date of Birth: July 23, 1990  [facility] CM/SW Contact:    Lucie Lunger, LCSWA Phone Number: 02/21/2024, 10:01 AM   Clinical Narrative: Transition of Care Department [facility]) has reviewed patient and no TOC needs have been identified at this time. We will continue to monitor patient advancement through interdiciplinary progression rounds. If new patient transition needs arise, please place a TOC consult.  Transition of Care Asessment: Insurance and Status: Insurance coverage has been reviewed Patient has primary care physician: Yes Home environment has been reviewed: From home Prior level of function:: Family assistance Prior/Current Home Services: No current home services Social Drivers of Health Review: SDOH reviewed no interventions necessary Readmission risk has been reviewed: Yes Transition of care needs: no transition of care needs at this time

## 2024-02-21 NOTE — Progress Notes (Signed)
 PROGRESS NOTE    Patient: Melissa West                            PCP: Edman Meade PEDLAR, FNP                    DOB: September 13, 1990            DOA: 02/20/2024 FMW:[id number]             DOS: 02/21/2024, 10:08 AM   LOS: 0 days   Date of Service: The patient was seen and examined on 02/21/2024  Subjective:   The patient was seen and examined this morning. Hemodynamically stable. No issues overnight .  Brief Narrative:   Melissa West is a 33 y.o. 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  States she normally has a bowel movement every day.  Denies any abdominal pain, nausea or vomiting.  No reported subjective fever.  States she is always cold.  Denies any other symptoms.   In the ER, an abdominal x-ray revealed large amount of retained stool in the left colon.  No free air.  No abnormal calcifications.   The patient received milk of magnesia and MiraLAX  with no improvement.  TR[facility], hospitalist service, was asked to admit for constipation and concern for possible MS flare.    Assessment & Plan:   Principal Problem:   Constipation   Constipation No bowel movement yet, pending smog enema this a.m. Continuing bowel regimen On abdominal x-ray, large amount of retained stool in the left colon.  No free air.  No abnormal calcifications. Endorses no prior history of constipation Not on opioids or anticholinergics.   The only medication she takes is Kesimpta  injection for MS  No bowel movements in the last 4 to 5 days. Continue bowel regimen: Senokot 2 tablets twice daily, MiraLAX  daily No improvement overnight, administer smog enema 400 mg times 1 in the morning Continue IV fluid hydration   Multiple sclerosis Takes Kesimpta  injection every month. Not  on any other medications. Follow-up with neurology outpatient Pending MRI-was ordered for reevaluation to rule out MS flare   Chronic normocytic anemia Hemoglobin 10.8 with MCV of 80 No overt bleeding reported Continue to monitor.   Neurogenic bladder status post suprapubic catheter placement prior to admission Continue routine Foley catheter care. Follow-up with urology outpatient.    ----------------------------------------------------------------------------------------------------------------------------------------------- Nutritional status:  The patient's BMI is: Body mass index is 21.28 kg/m. I agree with the assessment and plan as outlined --------------------------------------------------------------------------------------------------------------------------------------  DVT prophylaxis:  enoxaparin  (LOVENOX ) injection 40 mg Start: 02/21/24 1000   Code Status:   Code Status: Full Code  Family Communication: No family member present at bedside-  -Advance care planning has been discussed.   Admission status:   Status is: Observation The patient remains OBS appropriate and will d/c before 2 midnights.   Disposition: From  - home             Planning for discharge in 1-2 days   Procedures:   No admission procedures for hospital encounter.   Antimicrobials:  Anti-infectives (From admission, onward)    None        Medication:   enoxaparin  (LOVENOX ) injection  40 mg Subcutaneous Q24H   senna-docusate  2 tablet Oral  BID   SMOG  400 mL Rectal Once    acetaminophen , melatonin, polyethylene glycol, prochlorperazine   Objective:   Vitals:   02/21/24 0730 02/21/24 0745 02/21/24 0751 02/21/24 0800  BP:    99/73  Pulse: 83 82  86  Resp:   18   Temp:      TempSrc:      SpO2: 98% 100%  99%  Weight:      Height:        Intake/Output Summary (Last 24 hours) at 02/21/2024 1008 Last data filed at 02/21/2024 0334 Gross per 24 hour  Intake --  Output  575 ml  Net -575 ml   Filed Weights   02/20/24 1931  Weight: 54.5 kg     Physical examination:   General:  AAO x 3,  cooperative, no distress;   HEENT:  Normocephalic, PERRL, otherwise with in Normal limits   Neuro:  CNII-XII intact. , normal motor and sensation, reflexes intact   Lungs:   Clear to auscultation BL, Respirations unlabored,  No wheezes / crackles  Cardio:    S1/S2, RRR, No murmure, No Rubs or Gallops   Abdomen:  Soft, non-tender, bowel sounds active all four quadrants, no guarding or peritoneal signs.  Muscular  skeletal:  Limited exam -global generalized weaknesses - in bed, able to move all 4 extremities,   2+ pulses,  symmetric, No pitting edema  Skin:  Dry, warm to touch, negative for any Rashes,  Wounds: Please see nursing documentation       ------------------------------------------------------------------------------------------------------------------------------------------    LABs:     Latest Ref Rng & Units 02/21/2024    4:11 AM 02/20/2024    8:09 PM 02/08/2024    3:41 PM  CBC  WBC 4.0 - 10.5 K/uL 8.1  10.0  7.1   Hemoglobin 12.0 - 15.0 g/dL 9.6  89.1  88.6   Hematocrit 36.0 - 46.0 % 29.5  33.7  34.8   Platelets 150 - 400 K/uL 415  447  230       Latest Ref Rng & Units 02/21/2024    4:11 AM 02/20/2024    8:09 PM 02/08/2024    3:41 PM  CMP  Glucose 70 - 99 mg/dL 96  881  99   BUN 6 - 20 mg/dL 12  15  15    Creatinine 0.44 - 1.00 mg/dL 9.37  9.33  9.13   Sodium 135 - 145 mmol/L 135  135  136   Potassium 3.5 - 5.1 mmol/L 3.7  3.8  4.0   Chloride 98 - 111 mmol/L 101  99  101   CO2 22 - 32 mmol/L 24  24  26    Calcium  8.9 - 10.3 mg/dL 8.8  8.9  9.0        Micro Results No results found for this or any previous visit (from the past 240 hours).  Radiology Reports DG Abd Acute W/Chest Result Date: 02/20/2024 EXAM: UPRIGHT AND SUPINE XRAY VIEWS OF THE ABDOMEN AND 4 VIEW(S) OF THE CHEST 02/20/2024 08:40:00 PM COMPARISON: 05/18/2023  CLINICAL HISTORY: No bowel movement for 4 days. FINDINGS: LUNGS AND PLEURA: Mildly lower lung volumes. No consolidation or pulmonary edema. No pleural effusion or pneumothorax. HEART AND MEDIASTINUM: No acute abnormality of the cardiac and mediastinal silhouettes. BOWEL: Large amount of retained stool in the left colon. The bowel gas pattern is nonspecific. No bowel obstruction. PERITONEUM AND SOFT TISSUES: No abnormal calcifications. No free air. BONES: No acute osseous abnormality. IMPRESSION: 1. Large  amount of retained stool in the left colon. Electronically signed by: Norman Gatlin MD 02/20/2024 08:44 PM EST RP Workstation: [id number]    SIGNED: Adriana DELENA Grams, MD, FHM. FAAFP. Jolynn Pack - [facility] Time spent - 55 min.  In seeing, evaluating and examining the patient. Reviewing medical records, labs, drawn plan of care. [facility],  Pager (please use [email or web address] to page/ text) Please use Epic Secure Chat for non-urgent communication (7AM-7PM)  If 7PM-7AM, please contact night-coverage [email or web address], 02/21/2024, 10:08 AM

## 2024-02-21 NOTE — Progress Notes (Signed)
 Initial Nutrition Assessment  DOCUMENTATION CODES:   Not applicable  INTERVENTION:   Education provided on increasing fiber and water  intake to prevent constipation in the future.   NUTRITION DIAGNOSIS:   Altered GI function related to constipation as evidenced by per patient/family report.  GOAL:   Patient will meet greater than or equal to 90% of their needs  MONITOR:   PO intake, Skin  REASON FOR ASSESSMENT:   Rounds    ASSESSMENT:   33 yo female admitted with constipation, possible MS flare. PMH includes multiple sclerosis, neurogenic bladder, chronic LE weakness, wheelchair dependence, chronic anemia.  Patient was admitted with constipation. RD provided Constipation Meal Planning Tips handout from the [employer]. We discussed drinking plenty of water  (8 cups per day) and slowly increasing fiber intake.   Patient reports some weight loss PTA, despite good intake. She typically eats 3 meals per day plus snacks. Weight history reviewed. No weight loss noted; weight is trending up, however question accuracy of current weight as it was a bed weight. Patient is unable to walk; she uses a wheelchair to get around. There is muscle depletion evident on nutrition focused physical exam; this is likely r/t the disease process of multiple sclerosis.   Wt Readings from Last 10 Encounters:  02/21/24 58.3 kg  02/08/24 54.4 kg  01/11/24 55.3 kg  01/04/24 50.5 kg  12/13/23 50.8 kg  11/02/23 50.8 kg  10/23/23 50.8 kg  05/26/23 50.8 kg  05/18/23 59.1 kg  04/21/23 44.5 kg    Currently on a regular diet. Meal intakes not documented, but she had almost finished her lunch tray during RD visit. She has a very good appetite and doesn't want any supplements at this time. Reviewed good sources of dietary protein with patient.   Labs reviewed.  Phos 2.3  Medications reviewed and include senokot-s, SMOG enema. IVF: NS at 75 ml/h  NUTRITION - FOCUSED PHYSICAL  EXAM:  Flowsheet Row Most Recent Value  Orbital Region Mild depletion  Upper Arm Region Mild depletion  Thoracic and Lumbar Region No depletion  Buccal Region Mild depletion  Temple Region Mild depletion  Clavicle Bone Region Moderate depletion  Clavicle and Acromion Bone Region Moderate depletion  Scapular Bone Region Mild depletion  Dorsal Hand Moderate depletion  Patellar Region Unable to assess  Anterior Thigh Region Unable to assess  Posterior Calf Region Unable to assess  Edema (RD Assessment) Unable to assess  Hair Reviewed  Eyes Reviewed  Mouth Reviewed  Skin Reviewed  Nails Reviewed    Diet Order:   Diet Order             Diet regular Room service appropriate? Yes; Fluid consistency: Thin  Diet effective now                   EDUCATION NEEDS:   Education needs have been addressed  Skin:  Skin Assessment: Reviewed RN Assessment (healed wound to R buttock)  Last BM:  11/11 type 6  Height:   Ht Readings from Last 1 Encounters:  02/21/24 5' 3 (1.6 m)    Weight:   Wt Readings from Last 1 Encounters:  02/21/24 58.3 kg    Ideal Body Weight:  52.3 kg  BMI:  Body mass index is 22.77 kg/m.  Estimated Nutritional Needs:   Kcal:  1550-1750  Protein:  75-85 gm  Fluid:  1.6-1.8 L   Suzen HUNT RD, LDN, CNSC Contact via secure chat. If unavailable, use group  chat RD Inpatient.

## 2024-02-22 DIAGNOSIS — N319 Neuromuscular dysfunction of bladder, unspecified: Secondary | ICD-10-CM | POA: Diagnosis present

## 2024-02-22 DIAGNOSIS — N3090 Cystitis, unspecified without hematuria: Secondary | ICD-10-CM | POA: Diagnosis not present

## 2024-02-22 DIAGNOSIS — G35D Multiple sclerosis, unspecified: Secondary | ICD-10-CM | POA: Diagnosis present

## 2024-02-22 DIAGNOSIS — D649 Anemia, unspecified: Secondary | ICD-10-CM | POA: Diagnosis present

## 2024-02-22 DIAGNOSIS — Z8249 Family history of ischemic heart disease and other diseases of the circulatory system: Secondary | ICD-10-CM | POA: Diagnosis not present

## 2024-02-22 DIAGNOSIS — N3000 Acute cystitis without hematuria: Secondary | ICD-10-CM | POA: Diagnosis present

## 2024-02-22 DIAGNOSIS — B964 Proteus (mirabilis) (morganii) as the cause of diseases classified elsewhere: Secondary | ICD-10-CM | POA: Diagnosis present

## 2024-02-22 DIAGNOSIS — Z993 Dependence on wheelchair: Secondary | ICD-10-CM | POA: Diagnosis not present

## 2024-02-22 DIAGNOSIS — Z833 Family history of diabetes mellitus: Secondary | ICD-10-CM | POA: Diagnosis not present

## 2024-02-22 DIAGNOSIS — K5900 Constipation, unspecified: Secondary | ICD-10-CM | POA: Diagnosis present

## 2024-02-22 DIAGNOSIS — Z960 Presence of urogenital implants: Secondary | ICD-10-CM | POA: Diagnosis present

## 2024-02-22 DIAGNOSIS — K5904 Chronic idiopathic constipation: Secondary | ICD-10-CM

## 2024-02-22 DIAGNOSIS — I9589 Other hypotension: Secondary | ICD-10-CM | POA: Diagnosis present

## 2024-02-22 MED ORDER — SODIUM CHLORIDE 0.9 % IV SOLN
2.0000 g | INTRAVENOUS | Status: DC
  Administered 2024-02-22: 2 g via INTRAVENOUS
  Filled 2024-02-22: qty 20

## 2024-02-22 MED ORDER — SODIUM CHLORIDE 0.9 % IV BOLUS
1000.0000 mL | Freq: Once | INTRAVENOUS | Status: AC
  Administered 2024-02-22: 1000 mL via INTRAVENOUS

## 2024-02-22 MED ORDER — SENNOSIDES-DOCUSATE SODIUM 8.6-50 MG PO TABS: 2.0000 | ORAL_TABLET | Freq: Every day | ORAL | Status: DC

## 2024-02-22 NOTE — Evaluation (Signed)
 Physical Therapy Evaluation Patient Details Name: Melissa West MRN: [id number] DOB: [birth year] Today's Date: 02/22/2024  History of Present Illness  Melissa West is a 33 y.o. 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  States she normally has a bowel movement every day.  Denies any abdominal pain, nausea or vomiting.  No reported subjective fever.  States she is always cold.  Denies any other symptoms.   Clinical Impression  Patient agreeable to PT evaluation. Patient reports at baseline, she is dependent for transfers with use of hoyer life, and requires assistance for ADL/iADLs. Patient reports having all the necessary equipment at home, including power chair, hospital bed, mechanical lift. Patient is able to roll from R/L this date with min-mod assist and use of bed railings. Patient demonstrates moderate spasticity/tone in BLE with mild/mod contractures in knees bilaterally. Pt remains in bed at end of session, call button in reach and all needs met. Would recommend use of hoyer lift for out of bed activities while in hospital. Nursing staff made aware via chat. Due to patient being at baseline with mobility, patient does not present with urgent need for skilled physical therapy acutely at this time but may benefit in recommended setting. Patient discharged to care of nursing for ambulation daily as tolerated for length of stay.          If plan is discharge home, recommend the following: A lot of help with walking and/or transfers;A lot of help with bathing/dressing/bathroom;Assist for transportation;Assistance with cooking/housework;Help with stairs or ramp for entrance   Can travel by private vehicle        Equipment Recommendations None recommended by PT   Recommendations for Other Services       Functional Status Assessment Patient has had a recent decline in their functional status and demonstrates the ability to make significant improvements in function in a reasonable and predictable amount of time.     Precautions / Restrictions Precautions Precautions: Fall Recall of Precautions/Restrictions: Intact Restrictions Weight Bearing Restrictions Per Provider Order: No      Mobility  Bed Mobility Overal bed mobility: Needs Assistance Bed Mobility: Rolling Rolling: Mod assist, Min assist       General bed mobility comments: Min/mod assist to roll R/L, assisted at knees and hips    Transfers       General transfer comment: pt uses hoyer at baseline, reports she hasnt sat EOB or stood in a very long time    Ambulation/Gait   General Gait Details: pt non-ambulatory at baseline, power w/c  Stairs            Wheelchair Mobility     Tilt Bed    Modified Rankin (Stroke Patients Only)       Balance Overall balance assessment: Needs assistance     Sitting balance - Comments: pt uses hoyer at baseline, reports she hasnt sat EOB or stood in a very long time       Standing balance comment: Pt non-ambulatory at baseline         Pertinent Vitals/Pain Pain Assessment Pain Assessment: No/denies pain    Home Living Family/patient expects to be discharged to:: Private residence Living Arrangements: Other relatives Available Help at Discharge: Family;Personal care attendant;Available 24 hours/day Type of Home: House Home Access: Ramped entrance  Home Layout: One level Home Equipment: Wheelchair - manual;Wheelchair - power;Shower seat;BSC/3in1;Other (comment);Hospital bed [facility] lift) Additional Comments: pt reports no change in home set up or assist    Prior Function Prior Level of Function : Needs assist       Physical Assist : Mobility (physical);ADLs (physical) Mobility (physical):  Transfers;Gait ADLs (physical): Bathing;Dressing;IADLs Mobility Comments: pt reports non-ambulatory at baseline, family/aide uses hoyer lift for transfer from bed to power chair/scooter ADLs Comments: Assisted by home health aides and family for all     Extremity/Trunk Assessment   Upper Extremity Assessment Upper Extremity Assessment: Generalized weakness (Pt able to use BUE to assist with  bed mobility)    Lower Extremity Assessment Lower Extremity Assessment: Generalized weakness (Pt demo inc spasticity throughout BLE with passive knee extension, mild-mod contractures in both LE, little active movement during bed mobility)       Communication   Communication Communication: No apparent difficulties    Cognition Arousal: Alert Behavior During Therapy: WFL for tasks assessed/performed   PT - Cognitive impairments: No apparent impairments   Following commands: Intact       Cueing Cueing Techniques: Verbal cues, Tactile cues, Visual cues     General Comments      Exercises     Assessment/Plan    PT Assessment All further PT needs can be met in the next venue of care  PT Problem List Decreased strength;Decreased range of motion;Decreased activity tolerance;Decreased balance;Decreased mobility;Decreased coordination       PT Treatment Interventions      PT Goals (Current goals can be found in the Care Plan section)  Acute Rehab PT Goals Patient Stated Goal: Return home PT Goal Formulation: With patient Time For Goal Achievement: 02/24/24 Potential to Achieve Goals: Good    Frequency       Co-evaluation               AM-PAC PT 6 Clicks Mobility  Outcome Measure Help needed turning from your back to your side while in a flat bed without using bedrails?: A Little Help needed moving from lying on your back to sitting on the side of a flat bed without using bedrails?: A Lot Help needed moving to and from a bed to a chair (including a wheelchair)?: A  Lot Help needed standing up from a chair using your arms (e.g., wheelchair or bedside chair)?: A Lot Help needed to walk in hospital room?: A Lot Help needed climbing 3-5 steps with a railing? : A Lot 6 Click Score: 13    End of Session   Activity Tolerance: Patient tolerated treatment well Patient left: in bed;with call bell/phone within reach Nurse Communication: Need for lift equipment;Mobility status PT Visit Diagnosis: Other abnormalities of gait and mobility (R26.89)    Time: 1014-1030 PT Time Calculation (min) (ACUTE ONLY): 16 min   Charges:   PT Evaluation $PT Eval Low Complexity: 1 Low   PT General Charges $$ ACUTE PT VISIT: 1 Visit         1:34 PM, 02/22/24 Sequoyah Ramone Powell-Butler, PT, DPT [place] with [facility]

## 2024-02-22 NOTE — Progress Notes (Signed)
 IR Procedure Request - Suprapubic Catheter Exchange  33 y.o. female inpatient. History of MS, neurogenic bladder,. Presented to the ED at [facility] with constipation. Found to have morganella morganii UTI. Last exchanged in EPIC  occurred ion 6.1.23. Case discussed with IR Attending Dr. Thom Hall. Recommend procedure be exchanged at bedside. No indication for radiological imaging. This was communicated to the Team via EPIC Chat.

## 2024-02-22 NOTE — TOC Progression Note (Signed)
 Transition of Care [facility]) - Progression Note    Patient Details  Name: Melissa West MRN: [id number] Date of Birth: Mar 18, 1991  Transition of [facility]) CM/SW Contact  Lucie Lunger, [place] Phone Number: 02/22/2024, 3:51 PM  Clinical Narrative:    CSW updated that PT is recommending HH PT for pt at D/C. Pt has all needed DME in the home. CSW spoke with pt about HH being arranged. Pt states she is agreeable at this time and has no agency preference. CSW to send [facility] referral out to local agencies via HUB. TOC to follow.    Barriers to Discharge: Continued Medical Work up               Expected Discharge Plan and Services                                               Social Drivers of Health (SDOH) Interventions SDOH Screenings   Food Insecurity: No Food Insecurity (02/21/2024)  Housing: Unknown (02/21/2024)  Transportation Needs: No Transportation Needs (02/21/2024)  Utilities: Not At Risk (02/21/2024)  Depression (PHQ2-9): Low Risk  (01/19/2024)  Social Connections: Socially Isolated (02/21/2024)  Tobacco Use: Low Risk  (02/20/2024)    Readmission Risk Interventions    11/06/2021    1:05 PM  Readmission Risk Prevention Plan  Transportation Screening Complete  Medication Review (RN Care Manager) Complete  PCP or Specialist appointment within 3-5 days of discharge Not Complete  HRI or Home Care Consult Complete  SW Recovery Care/Counseling Consult Complete  Palliative Care Screening Not Applicable  Skilled Nursing Facility Not Applicable

## 2024-02-22 NOTE — Progress Notes (Signed)
 PROGRESS NOTE    Patient: Melissa West                            PCP: Edman Meade PEDLAR, FNP                    DOB: 11/28/1990            DOA: 02/20/2024 FMW:[id number]             DOS: 02/22/2024, 11:39 AM   LOS: 0 days   Date of Service: The patient was seen and examined on 02/22/2024  Subjective:  The patient was seen and examined this morning. Mildly hypertensive, satting 96% on room air, otherwise hemodynamically stable  Brief Narrative:   Melissa West is a 33 y.o. 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  States she normally has a bowel movement every day.  Denies any abdominal pain, nausea or vomiting.  No reported subjective fever.  States she is always cold.  Denies any other symptoms.   In the ER, an abdominal x-ray revealed large amount of retained stool in the left colon.  No free air.  No abnormal calcifications.   The patient received milk of magnesia and MiraLAX  with no improvement.  TRH, hospitalist service, was asked to admit for constipation and concern for possible MS flare.    Assessment & Plan:   Principal Problem:   Constipation Active Problems:   MS (multiple sclerosis)   Wheelchair dependence   Chronic anemia   Chronic hypotension   Neurogenic bladder   Acute cystitis without hematuria   Constipation-acute on chronic functional constipation -Positive movements but not significant enough Continuing bowel regimen On abdominal x-ray, large amount of retained stool in the left colon.  No free air.  No abnormal calcifications.  Not on opioids or anticholinergics.   The only medication she takes is Kesimpta  injection for MS  No bowel movements in the last 4 to 5 days. Continue bowel regimen: Senokot 2 tablets twice  daily, MiraLAX  daily -Has refused enemas Continue IV fluid hydration   Urinary tract infection  UA: Positive for large leukocyte esterase, proteinuria, PI minus, WBC > 50 Urine culture: >=100,000 COLONIES/mL MORGANELLA MORGANII  Pending sensitivity Complicated by suprapubic catheter -exchanged today 11/12 -continuing IV Rocephin    multiple sclerosis Takes Kesimpta  injection every month. Not on any other medications. Follow-up with neurology outpatient    Chronic normocytic anemia Hemoglobin 10.8 with MCV of 80    Latest Ref Rng & Units 02/21/2024    4:11 AM 02/20/2024    8:09 PM 02/08/2024    3:41 PM  CBC  WBC 4.0 - 10.5 K/uL 8.1  10.0  7.1   Hemoglobin 12.0 - 15.0 g/dL 9.6  89.1  88.6   Hematocrit 36.0 - 46.0 % 29.5  33.7  34.8   Platelets 150 - 400 K/uL 415  447  230    No overt bleeding reported Continue to monitor.   Neurogenic bladder status post suprapubic catheter placement prior to admission Continue routine Foley catheter care. -Will exchange suprapubic catheter today 02/22/2024 Follow-up with urology outpatient.    ----------------------------------------------------------------------------------------------------------------------------------------------- Nutritional status:  The patient's BMI is: Body mass index is 22.77 kg/m. I agree with the assessment and plan as outlined --------------------------------------------------------------------------------------------------------------------------------------  DVT prophylaxis:  enoxaparin  (LOVENOX ) injection 40 mg Start: 02/21/24 1000   Code Status:   Code Status: Full Code  Family Communication: No family member present at bedside-  -Advance care planning has been discussed.   Admission status:   Status is: Observation The patient remains OBS appropriate and will d/c before 2 midnights.   Disposition: From  - home             Planning for discharge in 1-2 days   Procedures:   No admission  procedures for hospital encounter.   Antimicrobials:  Anti-infectives (From admission, onward)    Start     Dose/Rate Route Frequency Ordered Stop   02/22/24 0900  cefTRIAXone  (ROCEPHIN ) 2 g in sodium chloride  0.9 % 100 mL IVPB        2 g 200 mL/hr over 30 Minutes Intravenous Every 24 hours 02/22/24 0744          Medication:   enoxaparin  (LOVENOX ) injection  40 mg Subcutaneous Q24H   feeding supplement  237 mL Oral BID BM    acetaminophen , magic mouthwash, melatonin, polyethylene glycol, prochlorperazine   Objective:   Vitals:   02/21/24 2035 02/22/24 0024 02/22/24 0541 02/22/24 0910  BP: 102/71 95/65 (!) 87/60 92/69  Pulse: (!) 104 (!) 104 (!) 102 95  Resp: 16 16 19    Temp: 98.9 F (37.2 C) 99.3 F (37.4 C) 98.9 F (37.2 C) 98.1 F (36.7 C)  TempSrc: Oral Oral Oral Oral  SpO2: 96% 98% 96% 96%  Weight:      Height:        Intake/Output Summary (Last 24 hours) at 02/22/2024 1139 Last data filed at 02/22/2024 0830 Gross per 24 hour  Intake 780 ml  Output 1600 ml  Net -820 ml   Filed Weights   02/20/24 1931 02/21/24 1118  Weight: 54.5 kg 58.3 kg     Physical examination:       General:  AAO x 3,  cooperative, no distress;   HEENT:  Normocephalic, PERRL, otherwise with in Normal limits   Neuro:  CNII-XII intact. , normal motor and sensation, reflexes intact   Lungs:   Clear to auscultation BL, Respirations unlabored,  No wheezes / crackles  Cardio:    S1/S2, RRR, No murmure, No Rubs or Gallops   Abdomen:  Soft, non-tender, bowel sounds active all four quadrants, no guarding or peritoneal signs.  Muscular  skeletal:  Limited exam -global generalized weaknesses - in bed, able to move all 4 extremities,   2+ pulses,  symmetric, No pitting edema  Skin:  Dry, warm to touch, negative for any Rashes,  Wounds: Chronic suprapubic catheter           ------------------------------------------------------------------------------------------------------------------------------------------    LABs:     Latest Ref Rng & Units 02/21/2024    4:11 AM 02/20/2024    8:09 PM 02/08/2024    3:41 PM  CBC  WBC 4.0 - 10.5 K/uL 8.1  10.0  7.1   Hemoglobin 12.0 - 15.0 g/dL 9.6  89.1  88.6   Hematocrit 36.0 - 46.0 % 29.5  33.7  34.8   Platelets 150 - 400 K/uL 415  447  230       Latest Ref Rng & Units 02/21/2024    4:11 AM 02/20/2024    8:09 PM 02/08/2024    3:41 PM  CMP  Glucose 70 - 99 mg/dL 96  881  99   BUN 6 - 20 mg/dL 12  15  15   Creatinine 0.44 - 1.00 mg/dL 9.37  9.33  9.13   Sodium 135 - 145 mmol/L 135  135  136   Potassium 3.5 - 5.1 mmol/L 3.7  3.8  4.0   Chloride 98 - 111 mmol/L 101  99  101   CO2 22 - 32 mmol/L 24  24  26    Calcium  8.9 - 10.3 mg/dL 8.8  8.9  9.0        Micro Results Recent Results (from the past 240 hours)  Urine Culture     Status: Abnormal (Preliminary result)   Collection Time: 02/21/24  3:24 AM   Specimen: Urine, Random  Result Value Ref Range Status   Specimen Description   Final    URINE, RANDOM Performed at [facility], [address], [place], [place] [address]    Special Requests   Final    NONE Reflexed from [id number] Performed at [facility], [address], [place], [place] [address]    Culture (A)  Final    >=100,000 COLONIES/mL MORGANELLA MORGANII CULTURE REINCUBATED FOR BETTER GROWTH SUSCEPTIBILITIES TO FOLLOW Performed at [facility], [address], [place], [place] [address]    Report Status PENDING  Incomplete    Radiology Reports No results found.   SIGNED: Adriana DELENA Grams, MD, FHM. FAAFP. Jolynn Pack - [facility] Time spent - 55 min.  In seeing, evaluating and examining the patient. Reviewing medical records, labs, drawn plan of care. [facility],  Pager (please use [email or web address] to page/ text) Please use Epic Secure Chat for non-urgent  communication (7AM-7PM)  If 7PM-7AM, please contact night-coverage [email or web address], 02/22/2024, 11:39 AM

## 2024-02-22 NOTE — Care Management Obs Status (Signed)
 MEDICARE OBSERVATION STATUS NOTIFICATION   Patient Details  Name: Melissa West MRN: [id number] Date of Birth: 18-Jul-1990   Medicare Observation Status Notification Given:  Yes    Duwaine LITTIE Ada 02/22/2024, 10:16 AM

## 2024-02-22 NOTE — Plan of Care (Signed)

## 2024-02-23 DIAGNOSIS — K5900 Constipation, unspecified: Secondary | ICD-10-CM | POA: Diagnosis not present

## 2024-02-23 MED ORDER — CHLORHEXIDINE GLUCONATE CLOTH 2 % EX PADS
6.0000 | MEDICATED_PAD | Freq: Every day | CUTANEOUS | Status: DC
  Administered 2024-02-23: 6 via TOPICAL

## 2024-02-23 MED ORDER — SENNOSIDES-DOCUSATE SODIUM 8.6-50 MG PO TABS: 2.0000 | ORAL_TABLET | Freq: Every day | ORAL | 0 refills | Status: AC

## 2024-02-23 NOTE — Care Management Important Message (Signed)
 Important Message  Patient Details  Name: Melissa West MRN: [id number] Date of Birth: [birth year]   Important Message Given:  N/A - LOS <3 / Initial given by admissions     Duwaine LITTIE Ada 02/23/2024, 11:13 AM

## 2024-02-23 NOTE — Plan of Care (Signed)
   Problem: Education: Goal: Knowledge of General Education information will improve Description Including pain rating scale, medication(s)/side effects and non-pharmacologic comfort measures Outcome: Progressing   Problem: Education: Goal: Knowledge of General Education information will improve Description Including pain rating scale, medication(s)/side effects and non-pharmacologic comfort measures Outcome: Progressing

## 2024-02-23 NOTE — Progress Notes (Signed)
 Patient discharged home with instructions given on medications and follow up visits,patient verbalized understanding.Prescriptions sent to Pharmacy of choice documented on AVS.IV discontinued catheter intact.[facility] to transport patient home.

## 2024-02-23 NOTE — TOC Transition Note (Signed)
 Transition of Care [facility]) - Discharge Note   Patient Details  Name: Melissa West MRN: [id number] Date of Birth: [birth year]  Transition of Care [facility]) CM/SW Contact:  Sharlyne Stabs, RN Phone Number: 02/23/2024, 10:55 AM   Clinical Narrative:   Patient discharging home with [facility], orders placed, [place] updated   Final next level of care: Home w Home Health Services Barriers to Discharge: Barriers Resolved   Patient Goals and CMS Choice Patient states their goals for this hospitalization and ongoing recovery are:: Home with home health     Discharge Placement          Patient and family notified of of transfer: 02/23/24  Discharge Plan and Services Additional resources added to the After Visit Summary for        Social Drivers of Health (SDOH) Interventions SDOH Screenings   Food Insecurity: No Food Insecurity (02/21/2024)  Housing: Unknown (02/21/2024)  Transportation Needs: No Transportation Needs (02/21/2024)  Utilities: Not At Risk (02/21/2024)  Depression (PHQ2-9): Low Risk  (01/19/2024)  Social Connections: Socially Isolated (02/21/2024)  Tobacco Use: Low Risk  (02/20/2024)     Readmission Risk Interventions    11/06/2021    1:05 PM  Readmission Risk Prevention Plan  Transportation Screening Complete  Medication Review (RN Care Manager) Complete  PCP or Specialist appointment within 3-5 days of discharge Not Complete  HRI or Home Care Consult Complete  SW Recovery Care/Counseling Consult Complete  Palliative Care Screening Not Applicable  Skilled Nursing Facility Not Applicable

## 2024-02-23 NOTE — Discharge Summary (Signed)
 Physician Discharge Summary   Patient: Melissa West MRN: [id number] DOB: 04/25/1990  Admit date:     02/20/2024  Discharge date: 02/23/24  Discharge Physician: Adriana DELENA Grams   PCP: Edman Meade PEDLAR, FNP   Recommendations at discharge:   Follow to PCP in 1 week S/p exchange of Foley catheter on 02/22/2024 by urologist-follow-up with Dr. Sherrilee, office in 3-4 weeks Continue stool softeners, to be held with loose stool-diarrhea  Discharge Diagnoses: Principal Problem:   Constipation Active Problems:   MS (multiple sclerosis)   Wheelchair dependence   Chronic anemia   Chronic hypotension   Neurogenic bladder   Acute cystitis without hematuria   Melissa West is a 33 y.o. 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  States she normally has a bowel movement every day.  Denies any abdominal pain, nausea or vomiting.  No reported subjective fever.  States she is always cold.  Denies any other symptoms.   In the ER, an abdominal x-ray revealed large amount of retained stool in the left colon.  No free air.  No abnormal calcifications.   The patient received milk of magnesia and MiraLAX  with no improvement.  TRH, hospitalist service, was asked to admit for constipation and concern for possible MS flare.  Constipation-acute on chronic functional constipation -Positive movements  - Patient is to continue Senokot with instructions to hold with loose stool and diarrhea   POA: On abdominal x-ray, large amount of retained stool in the left colon.  No free air.  No abnormal calcifications.   Not on opioids or anticholinergics.   The only medication she takes is Kesimpta  injection for MS   No bowel movements in the last 4 to 5 days. Was treated with  Senokot 2 tablets twice daily, MiraLAX  daily -Has refused enemas S/p IV fluid hydration   Urinary tract infection  UA: Positive for large leukocyte esterase, proteinuria, PI minus, WBC > 50 Urine culture: >=100,000 COLONIES/mL MORGANELLA MORGANII -chronically colonized  WithComplicated by suprapubic catheter -exchanged today 11/12 -continuing IV Rocephin --- discontinued -Discussed with Pharm.D. in detail, history reviewed, With a history of suprapubic catheter, chronic colonization with Morganella is noted no further signs of infection, discontinuing antibiotics (Also was discussed with primary urologist Dr. Sherrilee)  Neurogenic bladder, with suprapubic catheter -Was exchanged on 02/22/2024 with assist of patient's primary urologist Dr. Sherrilee     multiple sclerosis Takes Kesimpta  injection every month. Not on any other medications. Follow-up with neurology outpatient     Chronic normocytic anemia Hemoglobin 10.8 with MCV of 80     Latest Ref Rng & Units 02/21/2024    4:11 AM 02/20/2024    8:09 PM 02/08/2024    3:41 PM  CBC  WBC 4.0 - 10.5 K/uL 8.1  10.0  7.1   Hemoglobin 12.0 - 15.0 g/dL 9.6  89.1  88.6   Hematocrit 36.0 - 46.0 % 29.5  33.7  34.8   Platelets 150 - 400 K/uL 415  447  230     No overt bleeding reported Continue to monitor.   Neurogenic bladder status post suprapubic catheter placement prior to admission Continue routine Foley catheter care. -Will exchange suprapubic catheter today 02/22/2024 Follow-up with urology outpatient.  Nutritional status:  The patient's BMI is: Body mass index is 22.77 kg/m.      Consultants:  Urology/PT/TOC Procedures performed: Exchange of suprapubic Foley catheter Disposition: Home Diet recommendation:  Discharge Diet Orders (From admission, onward)     Start     Ordered   02/23/24 0000  Diet - low sodium heart healthy        02/23/24 0752           Regular diet DISCHARGE MEDICATION: Allergies as of  02/23/2024   No Known Allergies      Medication List     TAKE these medications    50-60CC SYRINGE 60 ML Misc Flush 30-50cc of sterile water  into catheter daily as needed   acetic acid  0.25 % irrigation IRRIGATE WITH AS DIRECTED DAILY   AMBULATORY NON FORMULARY MEDICATION 30 mLs by Intracatheter route daily as needed. Medication Name: Sodium Chloride  0.9%   AMBULATORY NON FORMULARY MEDICATION Medication Name: Bedside catheter bag   Kesimpta  20 MG/0.4ML Soaj Generic drug: Ofatumumab  Inject 0.4 mLs into the skin every 30 (thirty) days. Starting at week 4   senna-docusate 8.6-50 MG tablet Commonly known as: Senokot-S Take 2 tablets by mouth at bedtime.   sterile water  Irrigate with 50 mLs as directed daily. Irrigate catheter daily with 50cc of sterile water .        Discharge Exam: Filed Weights   02/20/24 1931 02/21/24 1118  Weight: 54.5 kg 58.3 kg        General:  AAO x 3,  cooperative, no distress;   HEENT:  Normocephalic, PERRL, otherwise with in Normal limits   Neuro:  CNII-XII intact. , normal motor and sensation, reflexes intact   Lungs:   Clear to auscultation BL, Respirations unlabored,  No wheezes / crackles  Cardio:    S1/S2, RRR, No murmure, No Rubs or Gallops   Abdomen:  Soft, non-tender, bowel sounds active all four quadrants, no guarding or peritoneal signs.  Muscular  skeletal:  Limited exam -severe global generalized weaknesses Severe lower extremity weakness - in bed, able to move all 4 extremities,   2+ pulses,  symmetric, No pitting edema  Skin:  Dry, warm to touch, negative for any Rashes,  Wounds: Please see nursing documentation          Condition at discharge: good  The results of significant diagnostics from this hospitalization (including imaging, microbiology, ancillary and laboratory) are listed below for reference.   Imaging Studies: DG Abd Acute W/Chest Result Date: 02/20/2024 EXAM: UPRIGHT AND SUPINE XRAY VIEWS OF  THE ABDOMEN AND 4 VIEW(S) OF THE CHEST 02/20/2024 08:40:00 PM COMPARISON: 05/18/2023 CLINICAL HISTORY: No bowel movement for 4 days. FINDINGS: LUNGS AND PLEURA: Mildly lower lung volumes. No consolidation or pulmonary edema. No pleural effusion or pneumothorax. HEART AND MEDIASTINUM: No acute abnormality of the cardiac and mediastinal silhouettes. BOWEL: Large amount of retained stool in the left colon. The bowel gas pattern is nonspecific. No bowel obstruction. PERITONEUM AND SOFT TISSUES: No abnormal calcifications. No free air. BONES: No acute osseous abnormality. IMPRESSION: 1. Large amount of retained stool in the left colon. Electronically signed by: Norman Gatlin MD 02/20/2024 08:44 PM EST RP Workstation: [id number]   CT Head Wo Contrast Result Date: 02/08/2024 CLINICAL DATA:  Increasing headache posteriorly. History of multiple sclerosis. EXAM: CT HEAD WITHOUT CONTRAST TECHNIQUE: Contiguous axial images were obtained from the base of the skull through the vertex without intravenous contrast. RADIATION DOSE REDUCTION: This exam was performed according to the departmental dose-optimization program which includes automated exposure control, adjustment of the mA and/or kV according to patient size and/or use of iterative  reconstruction technique. COMPARISON:  07/03/2021 FINDINGS: Brain: Ventricles are normal in size as there is continued evidence of absent septum pellucidum. Cisterns and CSF spaces are normal. There is no mass, mass effect or shift of midline structures. No acute hemorrhage. Stable white matter low attenuation compatible patient's known multiple sclerosis. Vascular: No hyperdense vessel or unexpected calcification. Skull: Normal. Negative for fracture or focal lesion. Sinuses/Orbits: Visualized orbits and paranasal sinuses are clear. Hypoplastic frontal sinuses. Other: None. IMPRESSION: 1. No acute findings. 2. Stable white matter low attenuation compatible patient's known multiple  sclerosis. Electronically Signed   By: Toribio Agreste M.D.   On: 02/08/2024 17:09    Microbiology: Results for orders placed or performed during the hospital encounter of 02/20/24  Urine Culture     Status: Abnormal (Preliminary result)   Collection Time: 02/21/24  3:24 AM   Specimen: Urine, Random  Result Value Ref Range Status   Specimen Description   Final    URINE, RANDOM Performed at [facility], [address], [place], [place] [address]    Special Requests   Final    NONE Reflexed from [id number] Performed at [facility], [address], [place], [place] [address]    Culture (A)  Final    >=100,000 COLONIES/mL MORGANELLA MORGANII CULTURE REINCUBATED FOR BETTER GROWTH SUSCEPTIBILITIES TO FOLLOW Performed at [facility], [address] [place], [place] [address]    Report Status PENDING  Incomplete    Labs: CBC: Recent Labs  Lab 02/20/24 2009 02/21/24 0411  WBC 10.0 8.1  NEUTROABS 8.5*  --   HGB 10.8* 9.6*  HCT 33.7* 29.5*  MCV 80.4 80.2  PLT 447* 415*   Basic Metabolic Panel: Recent Labs  Lab 02/20/24 2009 02/21/24 0411  NA 135 135  K 3.8 3.7  CL 99 101  CO2 24 24  GLUCOSE 118* 96  BUN 15 12  CREATININE 0.66 0.62  CALCIUM  8.9 8.8*  MG  --  1.9  PHOS  --  2.3*   Liver Function Tests: No results for input(s): AST, ALT, ALKPHOS, BILITOT, PROT, ALBUMIN in the last 168 hours. CBG: No results for input(s): GLUCAP in the last 168 hours.  Discharge time spent: greater than 30 minutes.  Signed: Adriana DELENA Grams, MD [facility] 02/23/2024

## 2024-02-23 NOTE — Plan of Care (Signed)

## 2024-02-23 NOTE — Progress Notes (Signed)
 Nurse at bedside,patient alert,and oriented to person,place,and situation,confused to time.No c/o pain or discomfort noted.Plan of care on going.

## 2024-02-23 NOTE — Progress Notes (Signed)
   02/23/24 1347  Vitals  BP 96/62  MAP (mmHg) 72  BP Location Right Arm  BP Method Automatic  Patient Position (if appropriate) Lying  Pulse Rate (!) 107  Pulse Rate Source Dinamap  MEWS COLOR  MEWS Score Color Yellow  Oxygen Therapy  SpO2 99 %  O2 Device Room Air  MEWS Score  MEWS Temp 0  MEWS Systolic 1  MEWS Pulse 1  MEWS RR 0  MEWS LOC 0  MEWS Score 2  Provider Notification  Provider Name/Title Dr Twana  Date Provider Notified 02/23/24  Time Provider Notified 1351  Method of Notification Page  Notification Reason Critical Result;Other (Comment) (hr 107.b/p 96/62)  Test performed and critical result vital signs  Date Critical Result Received 02/23/24  Time Critical Result Received 1347  Provider response No new orders  Date of Provider Response 02/23/24

## 2024-02-24 ENCOUNTER — Telehealth: Payer: Self-pay

## 2024-02-24 NOTE — Telephone Encounter (Signed)
 Copied from CRM [id number]. Topic: Clinical - Medication Question >> Feb 24, 2024  9:16 AM Delon HERO wrote: Reason for CRM: Patient is calling to ask if [facility] can write a script for B-12 pills. Advised she would need an appointment. Patient states that she would wait until her appointment 05/23/2024  Message was sent to the wrong office. KH

## 2024-02-24 NOTE — Transitions of Care (Post Inpatient/ED Visit) (Signed)
   02/24/2024  Name: Melissa West MRN: [id number] DOB: Mar 10, 1991  Today's TOC FU Call Status: Today's TOC FU Call Status:: Unsuccessful Call (1st Attempt) Unsuccessful Call (1st Attempt) Date: 02/24/24  Attempted to reach the patient regarding the most recent Inpatient/ED visit.  Follow Up Plan: Additional outreach attempts will be made to reach the patient to complete the Transitions of Care (Post Inpatient/ED visit) call.   Signature Julian Lemmings, LPN [facility] Nurse Health Advisor Direct Dial [phone]

## 2024-02-25 LAB — URINE CULTURE: Culture: >=100000 — AB

## 2024-02-27 ENCOUNTER — Telehealth: Payer: Self-pay

## 2024-02-27 NOTE — Telephone Encounter (Signed)
 Copied from CRM #[id number]. Topic: General - Other >> Feb 27, 2024 12:03 PM Hamdi H wrote: Reason for CRM: Patient returning a missed call from [place], Julian, LPN. Please return the patient's call.

## 2024-02-27 NOTE — Transitions of Care (Post Inpatient/ED Visit) (Unsigned)
   02/27/2024  Name: Melissa West MRN: [id number] DOB: [birth year]  Today's TOC FU Call Status: Today's TOC FU Call Status:: Unsuccessful Call (2nd Attempt) Unsuccessful Call (1st Attempt) Date: 02/24/24 Unsuccessful Call (2nd Attempt) Date: 02/27/24  Attempted to reach the patient regarding the most recent Inpatient/ED visit.  Follow Up Plan: Additional outreach attempts will be made to reach the patient to complete the Transitions of Care (Post Inpatient/ED visit) call.   Signature Julian Lemmings, LPN [facility] Nurse Health Advisor Direct Dial [phone]

## 2024-02-28 NOTE — Transitions of Care (Post Inpatient/ED Visit) (Signed)
   02/28/2024  Name: Melissa West MRN: [id number] DOB: 10-12-1990  Today's TOC FU Call Status: Today's TOC FU Call Status:: Unsuccessful Call (3rd Attempt) Unsuccessful Call (1st Attempt) Date: 02/24/24 Unsuccessful Call (2nd Attempt) Date: 02/27/24 Unsuccessful Call (3rd Attempt) Date: 02/28/24  Attempted to reach the patient regarding the most recent Inpatient/ED visit.  Follow Up Plan: No further outreach attempts will be made at this time. We have been unable to contact the patient.  Signature Julian Lemmings, LPN [facility] Nurse Health Advisor Direct Dial [phone]

## 2024-03-05 ENCOUNTER — Ambulatory Visit: Admitting: Urology

## 2024-03-06 ENCOUNTER — Ambulatory Visit

## 2024-03-14 ENCOUNTER — Telehealth: Payer: Self-pay

## 2024-03-14 NOTE — Telephone Encounter (Signed)
 Copied from CRM [id number]. Topic: Clinical - Medication Question >> Mar 14, 2024 12:31 PM Kevelyn M wrote: Reason for CRM: Patient is requesting to be prescribed Periguard to prevent bed sores/wounds.  Call back: [phone]

## 2024-03-26 ENCOUNTER — Ambulatory Visit

## 2024-03-26 DIAGNOSIS — R339 Retention of urine, unspecified: Secondary | ICD-10-CM

## 2024-03-26 DIAGNOSIS — N319 Neuromuscular dysfunction of bladder, unspecified: Secondary | ICD-10-CM

## 2024-03-26 MED ORDER — CIPROFLOXACIN HCL 500 MG PO TABS
500.0000 mg | ORAL_TABLET | Freq: Once | ORAL | Status: AC
  Administered 2024-03-26: 14:00:00 500 mg via ORAL

## 2024-03-26 NOTE — Progress Notes (Signed)
 Suprapubic Cath Change  Patient is present today for a suprapubic catheter change due to urinary retention.  10 ml of water  was drained from the balloon, a 22 FR foley cath was removed from the tract with out difficulty.  Suprapubic catheter site was cleaned and prepped in a sterile fashion with [id number]  A 22 FR foley cath was replaced into the tract no complications were noted. Urine return was noted, urine Clear yellow in color . 10 ml of sterile water  was inflated into the balloon and a leg bag was attached for drainage.  Patient tolerated well. A night bag was given to patient and proper instruction was given on how to switch bags.    Performed by: Carlos, CMA  Follow up: 4 weeks SP Tube Change

## 2024-04-16 ENCOUNTER — Ambulatory Visit: Admitting: Urology

## 2024-04-20 ENCOUNTER — Ambulatory Visit: Admitting: Urology

## 2024-04-27 ENCOUNTER — Other Ambulatory Visit: Payer: Self-pay

## 2024-04-27 ENCOUNTER — Encounter (HOSPITAL_COMMUNITY): Payer: Self-pay | Admitting: *Deleted

## 2024-04-27 ENCOUNTER — Emergency Department (HOSPITAL_COMMUNITY)
Admission: EM | Admit: 2024-04-27 | Discharge: 2024-04-27 | Disposition: A | Discharge status: Home / Self Care | Attending: Emergency Medicine | Admitting: Emergency Medicine

## 2024-04-27 DIAGNOSIS — T83098A Other mechanical complication of other indwelling urethral catheter, initial encounter: Secondary | ICD-10-CM | POA: Insufficient documentation

## 2024-04-27 DIAGNOSIS — Y732 Prosthetic and other implants, materials and accessory gastroenterology and urology devices associated with adverse incidents: Secondary | ICD-10-CM | POA: Insufficient documentation

## 2024-04-27 DIAGNOSIS — T83010A Breakdown (mechanical) of cystostomy catheter, initial encounter: Secondary | ICD-10-CM

## 2024-04-27 DIAGNOSIS — R82998 Other abnormal findings in urine: Secondary | ICD-10-CM | POA: Diagnosis not present

## 2024-04-27 LAB — URINALYSIS, W/ REFLEX TO CULTURE (INFECTION SUSPECTED)
Bilirubin Urine: NEGATIVE
Glucose, UA: NEGATIVE mg/dL
Ketones, ur: NEGATIVE mg/dL
Nitrite: POSITIVE — AB
Protein, ur: 100 mg/dL — AB
RBC / HPF: >50 RBC/hpf (ref 0–5)
Specific Gravity, Urine: 1.02 (ref 1.005–1.030)
pH: 6.5 (ref 5.0–8.0)

## 2024-04-27 NOTE — ED Triage Notes (Signed)
 Pt BIB [facility] from home for clogged foley catheter, reported that caregiver attempted to flush the catheter multiple times.

## 2024-04-27 NOTE — ED Provider Notes (Signed)
 " [place] EMERGENCY DEPARTMENT AT [facility] Provider Note   [name]: [id number] Arrival date & time: 04/27/24  1204     Patient presents with: No chief complaint on file.   Melissa West is a 34 y.o. female.   Pt is a 34 yo female with pmhx significant for severe MS and neurogenic bladder.  Pt said her suprapubic catheter is not draining.  Her caregiver tried to flush it, but it's not draining.  No fevers.  No pain.  It was last changed on 12/15 at the urology office.       Prior to Admission medications  Medication Sig Start Date End Date Taking? Authorizing Provider  acetic acid  0.25 % irrigation IRRIGATE WITH AS DIRECTED DAILY 03/22/23   [name], Belvie CROME, MD  AMBULATORY NON FORMULARY MEDICATION 30 mLs by Intracatheter route daily as needed. Medication Name: Sodium Chloride  0.9% 11/12/21   [name], Belvie CROME, MD  AMBULATORY NON FORMULARY MEDICATION Medication Name: Bedside catheter bag 08/12/22   Watt Rush, MD  KESIMPTA  20 MG/0.4ML SOAJ Inject 0.4 mLs into the skin every 30 (thirty) days. Starting at week 4 01/06/24   Sater, Charlie LABOR, MD  senna-docusate (SENOKOT-S) 8.6-50 MG tablet Take 2 tablets by mouth at bedtime. 02/23/24   Willette Adriana LABOR, MD  Syringe, Disposable, (50-60CC SYRINGE) 60 ML MISC Flush 30-50cc of sterile water  into catheter daily as needed 12/23/23   Sherrilee Belvie CROME, MD  Water  For Irrigation, Sterile (STERILE WATER ) Irrigate with 50 mLs as directed daily. Irrigate catheter daily with 50cc of sterile water . 12/23/23   [name], Belvie CROME, MD    Allergies: Patient has no known allergies.    Review of Systems  Genitourinary:        Suprapubic catheter not draining.  All other systems reviewed and are negative.   Updated Vital Signs BP 103/75 (BP Location: Left Arm)   Pulse 85   Temp 97.8 F (36.6 C) (Oral)   Resp 18   Ht 5' 3 (1.6 m)   Wt 58.3 kg   SpO2 99%   BMI 22.77 kg/m   Physical Exam Vitals and nursing note reviewed.   Constitutional:      Appearance: She is underweight.     Comments: Chronically ill  HENT:     Head: Normocephalic and atraumatic.     Right Ear: External ear normal.     Left Ear: External ear normal.     Nose: Nose normal.  Eyes:     Comments: Strabismus noted  Cardiovascular:     Rate and Rhythm: Normal rate and regular rhythm.     Pulses: Normal pulses.     Heart sounds: Normal heart sounds.  Pulmonary:     Effort: Pulmonary effort is normal.     Breath sounds: Normal breath sounds.  Musculoskeletal:     Cervical back: Normal range of motion and neck supple.     Comments: Contractures of arms/legs  Skin:    General: Skin is warm.     Capillary Refill: Capillary refill takes less than 2 seconds.  Neurological:     Mental Status: She is alert and oriented to person, place, and time. Mental status is at baseline.  Psychiatric:        Mood and Affect: Mood normal.     (all labs ordered are listed, but only abnormal results are displayed) Labs Reviewed  URINALYSIS, W/ REFLEX TO CULTURE (INFECTION SUSPECTED) - Abnormal; Notable for the following components:  Result Value   Color, Urine AMBER (*)    APPearance HAZY (*)    Hgb urine dipstick LARGE (*)    Protein, ur 100 (*)    Nitrite POSITIVE (*)    Leukocytes,Ua LARGE (*)    Bacteria, UA MANY (*)    All other components within normal limits  URINE CULTURE    EKG: None  Radiology: No results found.   BLADDER CATHETERIZATION  Date/Time: 04/27/2024 4:19 PM  Performed by: Dean Clarity, MD Authorized by: Dean Clarity, MD   Consent:    Consent obtained:  Verbal   Consent given by:  Patient Universal protocol:    Patient identity confirmed:  Verbally with patient Pre-procedure details:    Procedure purpose:  Therapeutic   Preparation: Patient was prepped and draped in usual sterile fashion   Anesthesia:    Anesthesia method:  None Procedure details:    Provider performed due to:  Altered anatomy,  complicated insertion and nurse unable to complete   Catheter insertion:  Indwelling   Catheter size:  22 Fr   Number of attempts:  1   Urine characteristics:  Clear Post-procedure details:    Procedure completion:  Tolerated well, no immediate complications Comments:     Suprapubic catheter exchange.    Medications Ordered in the ED - No data to display                                  Medical Decision Making Amount and/or Complexity of Data Reviewed Labs: ordered.   This patient presents to the ED for concern of suprapubic catheter dysfunction, this involves an extensive number of treatment options, and is a complaint that carries with it a high risk of complications and morbidity.  The differential diagnosis includes uti, blocked catheter   Co morbidities that complicate the patient evaluation  severe MS and neurogenic bladder   Additional history obtained:  Additional history obtained from epic chart review External records from outside source obtained and reviewed including EMS   Lab Tests:  I Ordered, and personally interpreted labs.  The pertinent results include:  UA + for infection  Medicines ordered and prescription drug management:   I have reviewed the patients home medicines and have made adjustments as needed  Problem List / ED Course:  Suprapubic catheter exchange:  pt tolerated procedure well.  She does appear to have an infection, but she does not have any pain or fevers.  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  I am going to hold off on abx for now.  Pt is in agreement.   Reevaluation:  After the interventions noted above, I reevaluated the patient and found that they have :improved   Social Determinants of Health:  Lives at home   Dispostion:  After consideration of the diagnostic results and the patients response to treatment, I feel that the patent would benefit from discharge with outpatient f/u.       Final diagnoses:   Suprapubic catheter dysfunction, initial encounter    ED Discharge Orders     None          Dean Clarity, MD 04/27/24 1622  "

## 2024-04-30 ENCOUNTER — Ambulatory Visit

## 2024-04-30 LAB — URINE CULTURE: Culture: 70000 — AB

## 2024-05-01 ENCOUNTER — Telehealth (HOSPITAL_BASED_OUTPATIENT_CLINIC_OR_DEPARTMENT_OTHER): Payer: Self-pay

## 2024-05-01 NOTE — Telephone Encounter (Signed)
 Post ED Visit - Positive Culture Follow-up  Culture report reviewed by antimicrobial stewardship pharmacist: [facility] Team [x]  Leonor Bash, [place].D. []  Venetia Gully, Pharm.D., BCPS AQ-ID []  Garrel Crews, Pharm.D., BCPS []  Almarie Lunger, Pharm.D., BCPS []  [place], [address].D., BCPS, AAHIVP []  Rosaline Bihari, Pharm.D., BCPS, AAHIVP []  Vernell Meier, PharmD, BCPS []  Latanya Hint, PharmD, BCPS []  Donald Medley, PharmD, BCPS []  Rocky Bold, PharmD []  Dorothyann Alert, PharmD, BCPS []  Morene Babe, PharmD  [facility] []  Rosaline Edison, PharmD []  Romona Bliss, PharmD []  Dolphus Roller, PharmD []  Veva Seip, Rph []  Vernell Daunt) Leonce, PharmD []  Eva Allis, PharmD []  Rosaline Millet, PharmD []  Iantha Batch, PharmD []  Arvin Gauss, PharmD []  Wanda Hasting, PharmD []  Ronal Rav, PharmD []  Rocky Slade, PharmD []  Bard Jeans, PharmD   Positive urine culture 34 yo with MS and neurogenic bladder found to have catheter issue. No urinary S/S. No treatment needed.   No further patient follow-up is required at this time.  Melissa West 05/01/2024, 9:39 AM

## 2024-05-02 ENCOUNTER — Ambulatory Visit: Admitting: Urology

## 2024-05-03 ENCOUNTER — Other Ambulatory Visit: Payer: Self-pay | Admitting: Urology

## 2024-05-23 ENCOUNTER — Ambulatory Visit: Admitting: Nurse Practitioner

## 2024-05-28 ENCOUNTER — Ambulatory Visit

## 2024-05-30 ENCOUNTER — Ambulatory Visit: Admitting: Family Medicine
